# Patient Record
Sex: Female | Born: 1960
Health system: Southern US, Community
[De-identification: ages and names within clinical notes are randomized; demographics above are authoritative.]

## PROBLEM LIST (undated history)

## (undated) DIAGNOSIS — E039 Hypothyroidism, unspecified: Secondary | ICD-10-CM

## (undated) DIAGNOSIS — E785 Hyperlipidemia, unspecified: Secondary | ICD-10-CM

## (undated) DIAGNOSIS — I82409 Acute embolism and thrombosis of unspecified deep veins of unspecified lower extremity: Secondary | ICD-10-CM

## (undated) DIAGNOSIS — I219 Acute myocardial infarction, unspecified: Secondary | ICD-10-CM

## (undated) DIAGNOSIS — R05 Cough: Secondary | ICD-10-CM

## (undated) DIAGNOSIS — K219 Gastro-esophageal reflux disease without esophagitis: Secondary | ICD-10-CM

## (undated) DIAGNOSIS — R001 Bradycardia, unspecified: Secondary | ICD-10-CM

## (undated) DIAGNOSIS — F419 Anxiety disorder, unspecified: Secondary | ICD-10-CM

## (undated) DIAGNOSIS — C50919 Malignant neoplasm of unspecified site of unspecified female breast: Secondary | ICD-10-CM

## (undated) DIAGNOSIS — Z803 Family history of malignant neoplasm of breast: Secondary | ICD-10-CM

## (undated) DIAGNOSIS — M1990 Unspecified osteoarthritis, unspecified site: Secondary | ICD-10-CM

## (undated) DIAGNOSIS — Z4502 Encounter for adjustment and management of automatic implantable cardiac defibrillator: Secondary | ICD-10-CM

## (undated) DIAGNOSIS — Z923 Personal history of irradiation: Secondary | ICD-10-CM

## (undated) DIAGNOSIS — Z87898 Personal history of other specified conditions: Secondary | ICD-10-CM

## (undated) DIAGNOSIS — T464X5A Adverse effect of angiotensin-converting-enzyme inhibitors, initial encounter: Secondary | ICD-10-CM

## (undated) DIAGNOSIS — I2699 Other pulmonary embolism without acute cor pulmonale: Secondary | ICD-10-CM

## (undated) DIAGNOSIS — I340 Nonrheumatic mitral (valve) insufficiency: Secondary | ICD-10-CM

## (undated) DIAGNOSIS — G459 Transient cerebral ischemic attack, unspecified: Secondary | ICD-10-CM

## (undated) DIAGNOSIS — R058 Other specified cough: Secondary | ICD-10-CM

## (undated) DIAGNOSIS — I4891 Unspecified atrial fibrillation: Secondary | ICD-10-CM

## (undated) DIAGNOSIS — M7989 Other specified soft tissue disorders: Secondary | ICD-10-CM

## (undated) DIAGNOSIS — I428 Other cardiomyopathies: Secondary | ICD-10-CM

## (undated) DIAGNOSIS — I5022 Chronic systolic (congestive) heart failure: Secondary | ICD-10-CM

## (undated) DIAGNOSIS — R42 Dizziness and giddiness: Secondary | ICD-10-CM

## (undated) DIAGNOSIS — Z95810 Presence of automatic (implantable) cardiac defibrillator: Secondary | ICD-10-CM

## (undated) DIAGNOSIS — T7840XA Allergy, unspecified, initial encounter: Secondary | ICD-10-CM

## (undated) HISTORY — DX: Other specified cough: R05.8

## (undated) HISTORY — DX: Personal history of other specified conditions: Z87.898

## (undated) HISTORY — DX: Dizziness and giddiness: R42

## (undated) HISTORY — DX: Encounter for adjustment and management of automatic implantable cardiac defibrillator: Z45.02

## (undated) HISTORY — DX: Nonrheumatic mitral (valve) insufficiency: I34.0

## (undated) HISTORY — PX: COLONOSCOPY: SHX174

## (undated) HISTORY — DX: Acute myocardial infarction, unspecified: I21.9

## (undated) HISTORY — DX: Chronic systolic (congestive) heart failure: I50.22

## (undated) HISTORY — DX: Adverse effect of angiotensin-converting-enzyme inhibitors, initial encounter: T46.4X5A

## (undated) HISTORY — DX: Transient cerebral ischemic attack, unspecified: G45.9

## (undated) HISTORY — DX: Family history of malignant neoplasm of breast: Z80.3

## (undated) HISTORY — DX: Gastro-esophageal reflux disease without esophagitis: K21.9

## (undated) HISTORY — PX: UPPER GI ENDOSCOPY: SHX6162

## (undated) HISTORY — DX: Hyperlipidemia, unspecified: E78.5

## (undated) HISTORY — DX: Other specified soft tissue disorders: M79.89

## (undated) HISTORY — DX: Other pulmonary embolism without acute cor pulmonale: I26.99

## (undated) HISTORY — DX: Bradycardia, unspecified: R00.1

## (undated) HISTORY — DX: Allergy, unspecified, initial encounter: T78.40XA

## (undated) HISTORY — DX: Acute embolism and thrombosis of unspecified deep veins of unspecified lower extremity: I82.409

## (undated) HISTORY — PX: CYST EXCISION: SHX5701

## (undated) HISTORY — DX: Anxiety disorder, unspecified: F41.9

## (undated) HISTORY — DX: Unspecified atrial fibrillation: I48.91

## (undated) HISTORY — DX: Other cardiomyopathies: I42.8

## (undated) HISTORY — DX: Cough: R05

## (undated) HISTORY — DX: Hypothyroidism, unspecified: E03.9

---

## 1992-10-24 HISTORY — PX: TUBAL LIGATION: SHX77

## 1999-04-13 ENCOUNTER — Encounter: Payer: Self-pay | Admitting: Endocrinology

## 1999-04-13 ENCOUNTER — Ambulatory Visit (HOSPITAL_COMMUNITY): Admission: RE | Admit: 1999-04-13 | Discharge: 1999-04-13 | Payer: Self-pay | Admitting: Endocrinology

## 1999-10-25 HISTORY — PX: TOTAL THYROIDECTOMY: SHX2547

## 1999-12-13 ENCOUNTER — Ambulatory Visit (HOSPITAL_COMMUNITY): Admission: RE | Admit: 1999-12-13 | Discharge: 1999-12-13 | Payer: Self-pay | Admitting: Endocrinology

## 1999-12-13 ENCOUNTER — Encounter: Payer: Self-pay | Admitting: Endocrinology

## 1999-12-31 ENCOUNTER — Other Ambulatory Visit: Admission: RE | Admit: 1999-12-31 | Discharge: 1999-12-31 | Payer: Self-pay | Admitting: Endocrinology

## 2000-01-17 ENCOUNTER — Encounter: Payer: Self-pay | Admitting: General Surgery

## 2000-01-18 ENCOUNTER — Ambulatory Visit (HOSPITAL_COMMUNITY): Admission: RE | Admit: 2000-01-18 | Discharge: 2000-01-19 | Payer: Self-pay | Admitting: General Surgery

## 2000-12-19 ENCOUNTER — Encounter (INDEPENDENT_AMBULATORY_CARE_PROVIDER_SITE_OTHER): Payer: Self-pay | Admitting: *Deleted

## 2000-12-19 ENCOUNTER — Ambulatory Visit (HOSPITAL_COMMUNITY): Admission: RE | Admit: 2000-12-19 | Discharge: 2000-12-19 | Payer: Self-pay | Admitting: *Deleted

## 2005-06-09 ENCOUNTER — Ambulatory Visit (HOSPITAL_COMMUNITY): Admission: RE | Admit: 2005-06-09 | Discharge: 2005-06-09 | Payer: Self-pay | Admitting: *Deleted

## 2005-06-09 ENCOUNTER — Encounter (INDEPENDENT_AMBULATORY_CARE_PROVIDER_SITE_OTHER): Payer: Self-pay | Admitting: *Deleted

## 2005-06-16 ENCOUNTER — Ambulatory Visit: Payer: Self-pay | Admitting: Family Medicine

## 2005-10-24 DIAGNOSIS — G459 Transient cerebral ischemic attack, unspecified: Secondary | ICD-10-CM

## 2005-10-24 DIAGNOSIS — I639 Cerebral infarction, unspecified: Secondary | ICD-10-CM

## 2005-10-24 HISTORY — DX: Transient cerebral ischemic attack, unspecified: G45.9

## 2005-10-24 HISTORY — DX: Cerebral infarction, unspecified: I63.9

## 2006-08-25 ENCOUNTER — Ambulatory Visit (HOSPITAL_COMMUNITY): Admission: RE | Admit: 2006-08-25 | Discharge: 2006-08-25 | Payer: Self-pay | Admitting: *Deleted

## 2006-08-25 ENCOUNTER — Encounter (INDEPENDENT_AMBULATORY_CARE_PROVIDER_SITE_OTHER): Payer: Self-pay | Admitting: *Deleted

## 2006-09-19 ENCOUNTER — Ambulatory Visit: Payer: Self-pay | Admitting: Cardiology

## 2006-09-19 ENCOUNTER — Encounter (INDEPENDENT_AMBULATORY_CARE_PROVIDER_SITE_OTHER): Payer: Self-pay | Admitting: Neurology

## 2006-09-19 ENCOUNTER — Encounter (INDEPENDENT_AMBULATORY_CARE_PROVIDER_SITE_OTHER): Payer: Self-pay | Admitting: Cardiology

## 2006-09-19 ENCOUNTER — Inpatient Hospital Stay (HOSPITAL_COMMUNITY): Admission: EM | Admit: 2006-09-19 | Discharge: 2006-09-21 | Payer: Self-pay | Admitting: Emergency Medicine

## 2006-09-21 ENCOUNTER — Encounter: Payer: Self-pay | Admitting: Cardiology

## 2006-10-04 ENCOUNTER — Ambulatory Visit: Payer: Self-pay | Admitting: Cardiology

## 2006-11-23 ENCOUNTER — Ambulatory Visit: Payer: Self-pay | Admitting: Cardiology

## 2006-11-30 ENCOUNTER — Ambulatory Visit: Payer: Self-pay | Admitting: Cardiology

## 2006-12-07 ENCOUNTER — Ambulatory Visit: Payer: Self-pay

## 2006-12-14 ENCOUNTER — Ambulatory Visit: Payer: Self-pay | Admitting: Cardiology

## 2007-06-07 ENCOUNTER — Ambulatory Visit: Payer: Self-pay | Admitting: Cardiology

## 2007-07-05 ENCOUNTER — Ambulatory Visit: Payer: Self-pay | Admitting: Cardiology

## 2007-08-07 ENCOUNTER — Ambulatory Visit: Payer: Self-pay | Admitting: Cardiology

## 2007-10-04 ENCOUNTER — Ambulatory Visit: Payer: Self-pay | Admitting: Cardiology

## 2007-10-15 ENCOUNTER — Ambulatory Visit: Payer: Self-pay | Admitting: Cardiology

## 2007-10-25 DIAGNOSIS — Z95810 Presence of automatic (implantable) cardiac defibrillator: Secondary | ICD-10-CM

## 2007-10-25 HISTORY — DX: Presence of automatic (implantable) cardiac defibrillator: Z95.810

## 2007-10-25 HISTORY — PX: CARDIAC DEFIBRILLATOR PLACEMENT: SHX171

## 2007-12-04 ENCOUNTER — Ambulatory Visit: Payer: Self-pay | Admitting: Cardiology

## 2007-12-21 ENCOUNTER — Ambulatory Visit: Payer: Self-pay | Admitting: Internal Medicine

## 2008-01-04 ENCOUNTER — Ambulatory Visit: Payer: Self-pay | Admitting: Internal Medicine

## 2008-01-04 LAB — CONVERTED CEMR LAB
Basophils Relative: 0.9 % (ref 0.0–1.0)
CO2: 29 meq/L (ref 19–32)
Calcium: 9 mg/dL (ref 8.4–10.5)
Eosinophils Relative: 7.8 % — ABNORMAL HIGH (ref 0.0–5.0)
GFR calc Af Amer: 86 mL/min
Glucose, Bld: 80 mg/dL (ref 70–99)
Hemoglobin: 10.8 g/dL — ABNORMAL LOW (ref 12.0–15.0)
Lymphocytes Relative: 25 % (ref 12.0–46.0)
Monocytes Absolute: 0.5 10*3/uL (ref 0.2–0.7)
Neutro Abs: 3.2 10*3/uL (ref 1.4–7.7)
Platelets: 184 10*3/uL (ref 150–400)
Prothrombin Time: 11.4 s (ref 10.9–13.3)
WBC: 5.8 10*3/uL (ref 4.5–10.5)

## 2008-01-11 ENCOUNTER — Observation Stay (HOSPITAL_COMMUNITY): Admission: RE | Admit: 2008-01-11 | Discharge: 2008-01-12 | Payer: Self-pay | Admitting: Internal Medicine

## 2008-01-11 ENCOUNTER — Ambulatory Visit: Payer: Self-pay | Admitting: Internal Medicine

## 2008-01-11 ENCOUNTER — Encounter: Payer: Self-pay | Admitting: Cardiology

## 2008-01-12 ENCOUNTER — Encounter: Payer: Self-pay | Admitting: Internal Medicine

## 2008-01-23 ENCOUNTER — Ambulatory Visit: Payer: Self-pay | Admitting: Cardiology

## 2008-01-23 ENCOUNTER — Ambulatory Visit: Payer: Self-pay

## 2008-01-23 LAB — CONVERTED CEMR LAB
CO2: 29 meq/L (ref 19–32)
Calcium: 8.5 mg/dL (ref 8.4–10.5)
Chloride: 108 meq/L (ref 96–112)
Glucose, Bld: 84 mg/dL (ref 70–99)
Sodium: 139 meq/L (ref 135–145)

## 2008-02-20 ENCOUNTER — Ambulatory Visit: Payer: Self-pay | Admitting: Cardiology

## 2008-04-21 ENCOUNTER — Ambulatory Visit: Payer: Self-pay | Admitting: Internal Medicine

## 2008-08-20 ENCOUNTER — Ambulatory Visit: Payer: Self-pay | Admitting: Cardiology

## 2008-09-11 ENCOUNTER — Encounter: Payer: Self-pay | Admitting: Cardiology

## 2008-09-30 ENCOUNTER — Ambulatory Visit: Payer: Self-pay | Admitting: Cardiology

## 2008-11-14 ENCOUNTER — Ambulatory Visit: Payer: Self-pay | Admitting: Internal Medicine

## 2008-11-17 ENCOUNTER — Encounter: Payer: Self-pay | Admitting: Internal Medicine

## 2009-04-10 ENCOUNTER — Encounter: Payer: Self-pay | Admitting: Cardiology

## 2009-05-11 ENCOUNTER — Telehealth: Payer: Self-pay | Admitting: Cardiology

## 2009-06-02 ENCOUNTER — Ambulatory Visit: Payer: Self-pay | Admitting: Cardiology

## 2009-11-09 ENCOUNTER — Encounter: Payer: Self-pay | Admitting: Cardiology

## 2009-12-17 ENCOUNTER — Encounter: Payer: Self-pay | Admitting: Cardiology

## 2010-01-19 ENCOUNTER — Encounter: Payer: Self-pay | Admitting: Cardiology

## 2010-01-19 DIAGNOSIS — Z8673 Personal history of transient ischemic attack (TIA), and cerebral infarction without residual deficits: Secondary | ICD-10-CM | POA: Insufficient documentation

## 2010-01-19 DIAGNOSIS — I428 Other cardiomyopathies: Secondary | ICD-10-CM | POA: Insufficient documentation

## 2010-01-20 ENCOUNTER — Ambulatory Visit: Payer: Self-pay | Admitting: Cardiology

## 2010-01-20 DIAGNOSIS — M79609 Pain in unspecified limb: Secondary | ICD-10-CM | POA: Insufficient documentation

## 2010-04-19 ENCOUNTER — Ambulatory Visit: Payer: Self-pay | Admitting: Internal Medicine

## 2010-11-02 ENCOUNTER — Telehealth: Payer: Self-pay | Admitting: Cardiology

## 2010-11-10 ENCOUNTER — Telehealth: Payer: Self-pay | Admitting: Cardiology

## 2010-11-23 NOTE — Letter (Signed)
Summary: Appointment- Rescheduled  [facility] [address]   [place], [place] [address]   Phone: [phone]  Fax: [phone]     December 17, 2009 MRN: [id number]     [facility] [place] [address] [place], [place]  [address]     Dear Ms. Meggison,   Due to a change in our office schedule, your appointment on March 31 at 2:30 pm                     must be changed.    Your new appointment is scheduled for January 20, 2010 at 1:30 pm.  We look forward to participating in your health care needs.   Please contact us at the number listed above at your earliest convenience to reschedule this appointment if needed.       Sincerely,  Glass blower/designer

## 2010-11-23 NOTE — Miscellaneous (Signed)
  Clinical Lists Changes  Problems: Added new problem of CARDIOMYOPATHY, PRIMARY (ICD-425.4) Added new problem of * RIGHT LEG SWELLING Added new problem of * WEIGHT GAIN Added new problem of * COUGH...ACE Added new problem of TIA (ICD-435.9) Added new problem of GERD (ICD-530.81) Added new problem of * ICD Added new problem of BRADYCARDIA (ICD-427.89) Observations: Added new observation of PAST MED HX: nonischemic cardiomyopathy...etiology unknown.. ejection fraction 30-35%  bradycardia  ICD placement Dr. Graciela Husbands  june 2009....(June 2009 artifactual atrial tachycardia response events hopefully addressed by reprogramming) GERD Seasonal allergies Hypothyroidism LDL elevation...mild...Pt. does not want to use a statin TIA...  no CT or MRI abnormality.Marland KitchenMarland KitchenASA  RX cough  (mild) from ACE inhibitor but ACE inhibitor continued at the patient's preference Weight gain   Swelling Right leg....venous insuff. (01/19/2010 10:58) Added new observation of PRIMARY MD: Prudy Feeler PA-C (01/19/2010 10:58)       Past History:  Past Medical History: nonischemic cardiomyopathy...etiology unknown.. ejection fraction 30-35%  bradycardia  ICD placement Dr. Graciela Husbands  june 2009....(June 2009 artifactual atrial tachycardia response events hopefully addressed by reprogramming) GERD Seasonal allergies Hypothyroidism LDL elevation...mild...Pt. does not want to use a statin TIA...  no CT or MRI abnormality.Marland KitchenMarland KitchenASA  RX cough  (mild) from ACE inhibitor but ACE inhibitor continued at the patient's preference Weight gain   Swelling Right leg....venous insuff.

## 2010-11-23 NOTE — Assessment & Plan Note (Signed)
Summary: 6 MONTH FU RECV LETTER VS   Visit Type:  Follow-up Primary Provider:  Prudy Feeler PA-C  CC:  cardiomyopathy.  History of Present Illness: The patient is seen for followup of cardiomyopathy.  The cardiac viewpoint she is doing well.  She is taking appropriate medications.  She has only mild shortness of breath.  No chest pain.  She is being bothered by discomfort in her left leg.  It affects her knee but also she can have pains that shoot up and down her left leg.  Preventive Screening-Counseling & Management  Alcohol-Tobacco     Smoking Status: never  Problems Prior to Update: 1)  Left Leg Pain  () 2)  Bradycardia  (ICD-427.89) 3)  Icd  () 4)  Gerd  (ICD-530.81) 5)  Tia  (ICD-435.9) 6)  Cough...ace  () 7)  Weight Gain  () 8)  Right Leg Swelling  () 9)  Cardiomyopathy, Primary  (ICD-425.4)  Medications Prior to Update: 1)  Carvedilol 25 Mg Tabs (Carvedilol) .... Take 1 Tablet By Mouth Twice A Day 2)  Simvastatin 20 Mg Tabs (Simvastatin) .... Take 1 Tablet By Mouth At Bedtime 3)  Lotensin 20 Mg Tabs (Benazepril Hcl) .... Take 1 Tablet By Mouth Once A Day  Current Medications (verified): 1)  Carvedilol 25 Mg Tabs (Carvedilol) .... Take 1 Tablet By Mouth Twice A Day 2)  Lotensin 20 Mg Tabs (Benazepril Hcl) .... Take 1 Tablet By Mouth Once A Day 3)  Aspir-Trin 325 Mg Tbec (Aspirin) .... Take One Tablet Daily 4)  Levothyroxine Sodium 100 Mcg Tabs (Levothyroxine Sodium) .... Take One Tablet Daily 5)  Vitamin D 50,000 Units .... Take One Daily 6)  Ambien 10 Mg Tabs (Zolpidem Tartrate) .... As Needed 7)  Prilosec 40 Mg Cpdr (Omeprazole) .... Twice A Day 8)  Welchol 625 Mg Tabs (Colesevelam Hcl) .... Take Six Tablet Daily  Allergies (verified): 1)  ! * Kiwi  Comments:  Nurse/Medical Assistant: The patient's medications and allergies were reviewed with the patient and were updated in the Medication and Allergy Lists. Youlanda Mighty RN (January 20, 2010 1:45  PM)  Past History:  Family History: Last updated: 09/30/2008 no strong family history of coronary disease  Past Medical History: nonischemic cardiomyopathy...etiology unknown.. ejection fraction 30-35%  bradycardia  ICD placement Dr. Graciela Husbands  june 2009....(June 2009 artifactual atrial tachycardia response events hopefully addressed by reprogramming) GERD Seasonal allergies Hypothyroidism LDL elevation...mild...Pt. does not want to use a statin TIA...  no CT or MRI abnormality.Marland KitchenMarland KitchenASA  RX cough  (mild) from ACE inhibitor but ACE inhibitor continued at the patient's preference Weight gain   Swelling Right leg....venous insuff. Pain in left leg and left knee  Social History: Smoking Status:  never  Review of Systems       Patient denies fever, chills, headache, sweats, rash, change in vision, change in hearing, chest pain, shortness of breath, cough, nausea vomiting, urinary symptoms.  All other systems are negative other than the history of present illness.  Vital Signs:  Patient profile:   50 year old female Height:      68 inches Weight:      201.8 pounds BMI:     30.79 Pulse rate:   56 / minute BP sitting:   110 / 76  (left arm) Cuff size:   regular  Nutrition Counseling: Patient's BMI is greater than 25 and therefore counseled on weight management options. CC: cardiomyopathy   Physical Exam  General:  patient is stable in general.  Eyes:  no xanthelasma. Neck:  no jugular venous distention. Lungs:  lungs are clear respiratory effort is unlabored. Heart:  cardiac exam reveals S1 and S2.  No clicks or significant murmurs. Abdomen:  abdomen is soft. Msk:  no musculoskeletal deformities. Extremities:  there is no significant swelling in her left leg.  She has a good distal pulse. Skin:  no skin rashes. Psych:  patient is oriented to person time and place.  Affect is normal.    ICD Specifications ICD Vendor:  [employer]     ICD Model Number:  T165     ICD  Serial Number:  [id number] ICD DOI:  01/11/2008     ICD Implanting MD:  Sherryl Manges, MD  Lead 1:    Location: RA     DOI: 01/11/2008     Model #: [id number]     Serial #: [id number]     Status: active Lead 2:    Location: RV     DOI: 01/11/2008     Model #: [id number]     Serial #: [id number]     Status: active  Indications::  NICM   Impression & Recommendations:  Problem # 1:  * ICD ICD is in place.  Problem # 2:  CARDIOMYOPATHY, PRIMARY (ICD-425.4)  Her updated medication list for this problem includes:    Carvedilol 25 Mg Tabs (Carvedilol) .[name]... Take 1 tablet by mouth twice a day    Lotensin 20 Mg Tabs (Benazepril hcl) .[name]... Take 1 tablet by mouth once a day    Aspir-trin 325 Mg Tbec (Aspirin) .[name]... Take one tablet daily Patient is on appropriate medicines for her cardiomyopathy.  No change in therapy.  Problem # 3:  * LEFT LEG PAIN I do not believe that the left leg pain is vascular in origin.  I have encouraged her to see her primary team for early evaluation.  Patient Instructions: 1)  Your physician recommends that you continue on your current medications as directed. Please refer to the Current Medication list given to you today. 2)  Your physician wants you to follow-up in: 6 months.  You will receive a reminder letter in the mail about two months in advance. If you don't receive a letter, please call our office to schedule the follow-up appointment. 3)  Please arrange follow up for your device or let us know and we can set you up.Appointment scheduled for May 4th @3 :00pm with Dr. Graciela Husbands at [facility].

## 2010-11-23 NOTE — Cardiovascular Report (Signed)
Summary: Office Visit   Office Visit   Imported By: Roderic Ovens 04/28/2010 15:41:30  _____________________________________________________________________  External Attachment:    Type:   Image     Comment:   External Document

## 2010-11-23 NOTE — Assessment & Plan Note (Signed)
Summary: f2y/ gd   Primary Provider:  Prudy Feeler PA-C  CC:  f2y/pt reports some swelling and pain in her feet and ankles that has been happening more frequently.  Pt has concerns about this as it has been becoming painful.  History of Present Illness: [name] [name] is seen in followup for her nonischemic cardiomyopathy and bradycardia status post ICD implantation for primary prevention.   The patient is seen for followup of cardiomyopathy.  The cardiac viewpoint she is doing well.  She is taking appropriate medications.  She has only mild shortness of breath.  No chest pain.  She has a variety of neurological complaints including tingling in her right arm and occasionally in her right leg. She'll to describes burning in her feet bilaterally.  old records suggested there was pain in her left leg.  Current Medications (verified): 1)  Carvedilol 25 Mg Tabs (Carvedilol) .... Take 1 Tablet By Mouth Twice A Day 2)  Lotensin 20 Mg Tabs (Benazepril Hcl) .... Take 1 Tablet By Mouth Once A Day 3)  Aspir-Trin 325 Mg Tbec (Aspirin) .... Take One Tablet Daily 4)  Levothyroxine Sodium 100 Mcg Tabs (Levothyroxine Sodium) .... Take One Tablet Daily 5)  Ambien 10 Mg Tabs (Zolpidem Tartrate) .... As Needed 6)  Prilosec 40 Mg Cpdr (Omeprazole) .... Twice A Day 7)  Nuvigil 150 Mg Tabs (Armodafinil) .... Take One Tablet Once Daily  Allergies (verified): 1)  ! [place]  Past History:  Past Medical History: Last updated: 01/20/2010 nonischemic cardiomyopathy...etiology unknown.. ejection fraction 30-35%  bradycardia  ICD placement Dr. Graciela Husbands  june 2009....(June 2009 artifactual atrial tachycardia response events hopefully addressed by reprogramming) GERD Seasonal allergies Hypothyroidism LDL elevation...mild...Pt. does not want to use a statin TIA...  no CT or MRI abnormality.Marland KitchenMarland KitchenASA  RX cough  (mild) from ACE inhibitor but ACE inhibitor continued at the patient's preference Weight gain   Swelling Right  leg....venous insuff. Pain in left leg and left knee  Family History: Last updated: 09/30/2008 no strong family history of coronary disease  Social History: Last updated: 09/30/2008 nonsmoker Widow  Double cover operator(physical work)  at [employer]  Vital Signs:  Patient profile:   50 year old female Height:      68 inches Weight:      203 pounds Pulse rate:   69 / minute Pulse rhythm:   regular BP sitting:   114 / 80  (left arm) Cuff size:   regular  Vitals Entered By: Judithe Modest CMA (April 19, 2010 3:43 PM)  Physical Exam  General:  The patient was alert and oriented in no acute distress. HEENT Normal.  Neck veins were flat, carotids were brisk.  Lungs were clear.  Heart sounds were regular without murmurs or gallops.  Abdomen was soft with active bowel sounds. There is no clubbing cyanosis or edema. Skin Warm and dry    EKG  Procedure date:  04/19/2010  Findings:      sinus rhythm 69 Intervals 0.17/0.08/23 Axis is 11   ICD Specifications Following MD:  Sherryl Manges, MD     ICD Vendor:  [facility] [employer]     ICD Model Number:  [phone]     ICD Serial Number:  [id number] ICD DOI:  01/11/2008     ICD Implanting MD:  Sherryl Manges, MD  Lead 1:    Location: RA     DOI: 01/11/2008     Model #: [id number]     Serial #: [id number]     Status: active  Lead 2:    Location: RV     DOI: 01/11/2008     Model #: 1610     Serial #: [id number]     Status: active  Indications::  NICM   ICD Follow Up Battery Voltage:  3.11 V     Charge Time:  7.4 seconds     Underlying rhythm:  SR@65    ICD Device Measurements Atrium:  Amplitude: 1.8 mV, Impedance: 454 ohms, Threshold: 0.40 V at 0.4 msec Right Ventricle:  Amplitude: 22.4 mV, Impedance: 511 ohms, Threshold: 0.8 V at 0.4 msec Shock Impedance: 42 ohms   Episodes MS Episodes:  0     Shock:  0     ATP:  0     Nonsustained:  0     Atrial Therapies:  0 Ventricular Pacing:  0%  Brady Parameters Mode DDD     Lower Rate Limit:  50     Upper  Rate Limit 120  Tachy Zones VF:  240     VT:  210     VT1:  180     Next Cardiology Appt Due:  06/24/2010 Tech Comments:  NORMAL DEVICE FUNCTION. NO CHANGES MADE. NO LANDLINE FOR LATITUDE CHECKS.  ROV IN 3 MTHS.  [name] [name]  April 19, 2010 3:59 PM  Impression & Recommendations:  Problem # 1:  CARDIOMYOPATHY, PRIMARY (ICD-425.4)  stable on her current medication Her updated medication list for this problem includes:    Carvedilol 25 Mg Tabs (Carvedilol) .[name]... Take 1 tablet by mouth twice a day    Lotensin 20 Mg Tabs (Benazepril hcl) .[name]... Take 1 tablet by mouth once a day    Aspir-trin 325 Mg Tbec (Aspirin) .[name]... Take one tablet daily  Her updated medication list for this problem includes:    Carvedilol 25 Mg Tabs (Carvedilol) .[name]... Take 1 tablet by mouth twice a day    Lotensin 20 Mg Tabs (Benazepril hcl) .[name]... Take 1 tablet by mouth once a day    Aspir-trin 325 Mg Tbec (Aspirin) .[name]... Take one tablet daily  Problem # 2:  AUTOMATIC IMPLANTABLE CARDIAC DEFIBRILLATOR SITU (ICD-V45.02) Device parameters and data were reviewed and no changes were made  Problem # 3:  FOOT PAIN (ICD-729.5) Her symptoms are suggestive of a neuropathy. With her right-sided symptoms now and previously described left-sided symptoms I wonder whether there is international units and her see a neurologist. I have suggested that she follow up with her primary care provider to request this referral.  Other Orders: EKG w/ Interpretation (93000)  Patient Instructions: 1)  Your physician recommends that you schedule a follow-up appointment in: 3 months in [place] device clinic.

## 2010-11-25 NOTE — Progress Notes (Signed)
Summary: Dentist office  Phone Note From Other Clinic Call back at [phone] or [phone]   Caller: Darl Pikes w/Dr. Jennette Kettle Summary of Call: Darl Pikes with Dr. Jennette Kettle (dentist) in [place] called stating they haven't seen pt in a while. She is re-establishing with them and has told them that she has had a lot of medical problems since last visit with them. Darl Pikes states pt told them she had a stroke and ICD placed. She would like to know if she has any restrictions regarding dental work. She states she would like to kow if pt needs a pre-med of antibiotic before dental work. She states pt has told them in the past that she has MVP so they have always treated w/pre-med before cleanings but she would like to know if this is necessary.  Initial call taken by: Cyril Loosen, RN, BSN,  November 02, 2010 5:02 PM  Follow-up for Phone Call        The patient can have usual dental work with no restrictions. There is no need for antibiotic prophyllaxis.  Talitha Givens, MD, [facility]  November 02, 2010 11:12 PM Darl Pikes w/Dr. Jennette Kettle notified and verbalized understanding. Follow-up by: Cyril Loosen, RN, BSN,  November 03, 2010 9:33 AM

## 2010-11-25 NOTE — Progress Notes (Signed)
Summary: Dizziness  Phone Note Call from Patient   Summary of Call: Pt called the office stating she is experiencing dizziness when she stands after squating. She states this first started on Monday (1/16) night at work. She states she tought she would feel better if she ate something but she didn't. She states the next am (1/17) her BP was 100/66. She states she watched it throughout the day and it never really came back up. She states Tues night at work she had the same symptoms. Her BP was 116/74. She states she had to leave work d/t dizziness when standing from a squat. She states when she's walking or doing anything she's fine. Pt denies any other symptoms or problems. She has appt pending with Dr. Myrtis Ser on 1/16. She will see primary MD for further evaluation of dizziness and have them contact us if they feel pt needs cardiac eval prior to 1/16. Initial call taken by: Cyril Loosen, RN, BSN,  November 10, 2010 11:06 AM     Appended Document: Dizziness Thanks

## 2010-12-31 ENCOUNTER — Encounter (INDEPENDENT_AMBULATORY_CARE_PROVIDER_SITE_OTHER): Payer: Self-pay | Admitting: *Deleted

## 2011-01-04 ENCOUNTER — Encounter: Payer: Self-pay | Admitting: Cardiology

## 2011-01-04 NOTE — Letter (Signed)
Summary: Appointment - Reschedule  [facility], Main Office  [address]   [place], [place] [address]   Phone: [phone]  Fax: [phone]     December 31, 2010 MRN: [id number]   [facility] [place] [address] [place], [place]  [address]   Dear Ms. Scarlett,   Due to a change in our office schedule, your appointment on 01-19-2011                      at  2:00 p.m.   must be changed.  It is very important that we reach you to reschedule this appointment. We look forward to participating in your health care needs. Please contact us at the number listed above at your earliest convenience to reschedule this appointment.     Sincerely,       Lorne Skeens  [facility] Scheduling Team

## 2011-01-05 ENCOUNTER — Encounter: Payer: Self-pay | Admitting: Internal Medicine

## 2011-01-05 ENCOUNTER — Ambulatory Visit: Payer: Self-pay | Admitting: Cardiology

## 2011-01-17 ENCOUNTER — Encounter: Payer: Self-pay | Admitting: Cardiology

## 2011-01-17 DIAGNOSIS — E7800 Pure hypercholesterolemia, unspecified: Secondary | ICD-10-CM | POA: Insufficient documentation

## 2011-01-17 DIAGNOSIS — K219 Gastro-esophageal reflux disease without esophagitis: Secondary | ICD-10-CM | POA: Insufficient documentation

## 2011-01-17 DIAGNOSIS — M7989 Other specified soft tissue disorders: Secondary | ICD-10-CM | POA: Insufficient documentation

## 2011-01-17 DIAGNOSIS — R058 Other specified cough: Secondary | ICD-10-CM | POA: Insufficient documentation

## 2011-01-17 DIAGNOSIS — E039 Hypothyroidism, unspecified: Secondary | ICD-10-CM | POA: Insufficient documentation

## 2011-01-17 DIAGNOSIS — T464X5A Adverse effect of angiotensin-converting-enzyme inhibitors, initial encounter: Secondary | ICD-10-CM | POA: Insufficient documentation

## 2011-01-17 DIAGNOSIS — Z95810 Presence of automatic (implantable) cardiac defibrillator: Secondary | ICD-10-CM | POA: Insufficient documentation

## 2011-01-17 DIAGNOSIS — I428 Other cardiomyopathies: Secondary | ICD-10-CM | POA: Insufficient documentation

## 2011-01-17 DIAGNOSIS — R001 Bradycardia, unspecified: Secondary | ICD-10-CM | POA: Insufficient documentation

## 2011-01-17 DIAGNOSIS — E782 Mixed hyperlipidemia: Secondary | ICD-10-CM | POA: Insufficient documentation

## 2011-01-17 DIAGNOSIS — E785 Hyperlipidemia, unspecified: Secondary | ICD-10-CM | POA: Insufficient documentation

## 2011-01-17 DIAGNOSIS — R05 Cough: Secondary | ICD-10-CM | POA: Insufficient documentation

## 2011-01-18 ENCOUNTER — Ambulatory Visit (INDEPENDENT_AMBULATORY_CARE_PROVIDER_SITE_OTHER): Payer: BC Managed Care – PPO | Admitting: Cardiology

## 2011-01-18 ENCOUNTER — Encounter: Payer: Self-pay | Admitting: Cardiology

## 2011-01-18 ENCOUNTER — Encounter: Payer: Self-pay | Admitting: *Deleted

## 2011-01-18 DIAGNOSIS — R05 Cough: Secondary | ICD-10-CM

## 2011-01-18 DIAGNOSIS — R42 Dizziness and giddiness: Secondary | ICD-10-CM | POA: Insufficient documentation

## 2011-01-18 DIAGNOSIS — I498 Other specified cardiac arrhythmias: Secondary | ICD-10-CM

## 2011-01-18 DIAGNOSIS — T464X5A Adverse effect of angiotensin-converting-enzyme inhibitors, initial encounter: Secondary | ICD-10-CM

## 2011-01-18 DIAGNOSIS — R058 Other specified cough: Secondary | ICD-10-CM

## 2011-01-18 DIAGNOSIS — I428 Other cardiomyopathies: Secondary | ICD-10-CM | POA: Insufficient documentation

## 2011-01-18 DIAGNOSIS — M7989 Other specified soft tissue disorders: Secondary | ICD-10-CM

## 2011-01-18 DIAGNOSIS — R059 Cough, unspecified: Secondary | ICD-10-CM

## 2011-01-18 DIAGNOSIS — Z4502 Encounter for adjustment and management of automatic implantable cardiac defibrillator: Secondary | ICD-10-CM

## 2011-01-18 DIAGNOSIS — R001 Bradycardia, unspecified: Secondary | ICD-10-CM

## 2011-01-18 MED ORDER — CARVEDILOL 12.5 MG PO TABS
12.5000 mg | ORAL_TABLET | Freq: Two times a day (BID) | ORAL | Status: DC
Start: 2011-01-18 — End: 2011-11-22

## 2011-01-18 NOTE — Progress Notes (Signed)
HPI The patient is seen for followup of cardiomyopathy.  She is actually doing well.  She works regularly.  She has had some mild dizziness when standing after squatting at work.  Her but has a probe dose was adjusted.  She's also had some mild brief squeezing sensations in her chest.  These are very limited and not related to exercise.  She has some mild discomfort in her legs time to time.  She has not had syncope or presyncope. No Known Allergies  Current Outpatient Prescriptions  Medication Sig Dispense Refill  . Armodafinil 150 MG tablet Take 150 mg by mouth as needed.        [name] aspirin 325 MG EC tablet Take 325 mg by mouth daily.        . benazepril (LOTENSIN) 20 MG tablet Take 10 mg by mouth daily.       . carvedilol (COREG) 25 MG tablet Take 25 mg by mouth 2 (two) times daily with a meal.        . colesevelam (WELCHOL) 625 MG tablet Take 625 mg by mouth. 6 daily       . levothyroxine (SYNTHROID, LEVOTHROID) 100 MCG tablet Take 100 mcg by mouth daily.        . Vitamin D, Ergocalciferol, (DRISDOL) 50000 UNITS CAPS Take 50,000 Units by mouth once a week.        . zolpidem (AMBIEN) 10 MG tablet Take 10 mg by mouth at bedtime as needed.        [name] DISCONTD: Armodafinil 150 MG tablet Take 150 mg by mouth daily.        [name] DISCONTD: omeprazole (PRILOSEC) 40 MG capsule Take 40 mg by mouth 2 (two) times daily.          History   Social History  . Marital Status: Single    Spouse Name: N/A    Number of Children: N/A  . Years of Education: N/A   Occupational History  . Widow    Social History Main Topics  . Smoking status: Never Smoker   . Smokeless tobacco: Never Used  . Alcohol Use: Not on file  . Drug Use: Not on file  . Sexually Active: Not on file   Other Topics Concern  . Not on file   Social History Narrative  . No narrative on file    No family history on file.  Past Medical History  Diagnosis Date  . Nonischemic cardiomyopathy     .etiology unknown.. ejection fraction  30-35%   . Bradycardia   . GERD (gastroesophageal reflux disease)   . Hypothyroidism   . Dyslipidemia     LDL elevation,Patient does not want statin  . TIA (transient ischemic attack)     No CT or MRI abnormality, aspirin therapy  . Weight gain   . Leg swelling     right leg  . Pain     left leg & left knee  . Cough due to angiotensin-converting enzyme inhibitor     But patient chooses to continue ACE  . ICD (implantable cardiac defibrillator) battery depletion     Dr. Graciela Husbands, June, 2009(artifactual atrial tachycardia response events addressed by reprogramming in parentheses    Past Surgical History  Procedure Date  . Cardiac defibrillator placement     [facility] [employer] no remote    ROS  Patient denies fever, chills, headache, rash, change in vision, change in hearing, chest pain, cough, nausea vomiting, urinary symptoms.  All of the systems are  reviewed and are negative. PHYSICAL EXAM Patient appears quite stable.  She is oriented to person time and place.  Affect is normal head is atraumatic.  There is no xanthelasma.  There is no jugular venous distention.  Lungs are clear.  Respiratory effort is not labored.  Cardiac exam reveals S1 and S2.  There are no clicks or significant murmurs.  The abdomen is soft.  There is no significant peripheral edema.  There are no musculoskeletal deformities.  There are no skin rashes. Filed Vitals:   01/18/11 0933  BP: 124/74  Pulse: 63  Resp: 18  Height: 5\' 8"  (1.727 m)  Weight: 200 lb (90.719 kg)    EKG EKG is done today and reviewed by me.  There is normal sinus rhythm.  There is nonspecific ST-T wave changes.  There is no significant change.  ASSESSMENT & PLAN

## 2011-01-18 NOTE — Assessment & Plan Note (Signed)
Patient has had some dizziness when she stands after squatting at work.  They're most likely has an orthostatic component to this.  However her resting blood pressure is quite good.  I am hesitant to lower her ACE inhibitor dose further.  She does have resting bradycardia.  I will cut her carvedilol dose down from 25-12.5 b.i.d.  She asked me how long she would have to take the medicines for her heart.  I explained to her that she would be taking these indefinitely.

## 2011-01-18 NOTE — Patient Instructions (Signed)
Your physician recommends that you schedule a follow-up appointment in: 12 months with Dr. Myrtis Ser When exercising your target heart rate is 120 beats per minute. Your physician has recommended you make the following change in your medication: Decrease carvedilol to 12.5 mg by mouth twice daily.

## 2011-01-18 NOTE — Assessment & Plan Note (Signed)
She is not having a significant cough.  No change in therapy.

## 2011-01-18 NOTE — Assessment & Plan Note (Signed)
Heart rate is low today.  I will be decreasing her carvedilol dose.

## 2011-01-18 NOTE — Assessment & Plan Note (Addendum)
The patient is on appropriate medications.I reviewed the record and at this point I believe she has never had a cardiac catheterization.  She has been stable and I am not pushing for that at this time.  I've considered a followup echo.  Clinically she is stable and there is no need for echo data at this time. EKG is done today and reviewed by me.  There is sinus rhythm and no significant change.  The patient will begin an exercise program related to her primary care team.  I recommended that he heart rate in the range of 120.

## 2011-01-18 NOTE — Assessment & Plan Note (Signed)
No significant swelling at this time.  No change in therapy.

## 2011-01-18 NOTE — Assessment & Plan Note (Signed)
Her ICD is monitored carefully.  No changes are needed.

## 2011-01-19 ENCOUNTER — Encounter: Payer: Self-pay | Admitting: Internal Medicine

## 2011-02-09 ENCOUNTER — Encounter: Payer: Self-pay | Admitting: Internal Medicine

## 2011-02-22 ENCOUNTER — Encounter: Payer: Self-pay | Admitting: Internal Medicine

## 2011-03-08 NOTE — Assessment & Plan Note (Signed)
[facility]                            CARDIOLOGY OFFICE NOTE   VY, BADLEY                         MRN:          [id number]  DATE:02/20/2008                            DOB:          [birth year]    CANCELLED DICTATION     [name], MD, [facility]     JDK/MedQ  DD: 02/20/2008  DT: 02/20/2008  Job #: [id number]

## 2011-03-08 NOTE — Assessment & Plan Note (Signed)
[facility]                          [facility]   [name], [name]                         MRN:          [id number]  DATE:09/30/2008                            DOB:          1961-01-22    [name] [name] is here for followup.  She is doing well.  I saw her on August 20, 2008.  There was a question that she had a cough from lisinopril.  She stopped the lisinopril and the cough disappeared completely, but she  felt that it was only mild, and she preferred to be on the lisinopril  rather than having to start a different medication.  She is back on it  and she may have a slight cough, but it does not bother her and I am  comfortable with this.  This is her preference.   Also, she had recent labs.  Prior to that, she had stopped the  simvastatin on her own.  Her LDL was 87 and HDL 63 off simvastatin, but  certainly, we do not need to push for statin in her case.  Cardiac  catheterization has not been done so we really do not know the status of  her coronaries.   ALLERGIES:  No known drug allergies.   MEDICATIONS:  Aspirin, benazepril, thyroid, carvedilol, and omeprazole.   OTHER MEDICAL PROBLEMS:  See the list on the prior notes.   REVIEW OF SYSTEMS:  She has no GI or GU symptoms.  She has no fevers or  chills.  There are no skin rashes.  Her review of systems is negative.   PHYSICAL EXAMINATION:  VITAL SIGNS:  Blood pressure is 112/72 with a  pulse of 65.  GENERAL:  The patient is oriented to person, time, and place.  Affect is  normal.  HEENT:  No xanthelasma.  She has normal extraocular motion.  NECK:  There are no carotid bruits.  There is no jugular venous  distention.  LUNGS:  Clear.  Respiratory effort is not labored.  CARDIAC:  S1 and S2.  There are no clicks or significant murmurs.  ABDOMEN:  Soft.  She has no peripheral edema.   No labs were done today.   The patient has a cardiomyopathy.  She is on the appropriate  medications.  She is stable.  I will see her back in 6 months.  We will  arrange for her ICD followup to be done here in the [facility].     [name], MD, [facility]  Electronically Signed    JDK/MedQ  DD: 09/30/2008  DT: 10/01/2008  Job #: [id number]

## 2011-03-08 NOTE — Assessment & Plan Note (Signed)
[facility]                          [facility]   [name], FERRAIOLO                         MRN:          [id number]  DATE:07/05/2007                            DOB:          [birth year]    Patient has cardiomyopathy, and we have been titrating her meds.  Most  recently, we increased her carvedilol up to 12.5 b.i.d.  She is  tolerating this and doing well without any significant problems.  She is  here today for followup.   PAST MEDICAL HISTORY:  Other medical problems, see the list below.   ALLERGIES:  No known drug allergies.   MEDICATIONS:  Aspirin, Synthroid, Protonix, Lotensin, and carvedilol  12.5 b.i.d. (to be increased today).   REVIEW OF SYSTEMS:  She has slight discomfort in her left knee.  She  asked if she could use ibuprofen.  I encouraged her to use as small an  amount as possible and that Tylenol would be performed.  Otherwise, her  review of systems is negative.   PHYSICAL EXAMINATION:  Blood pressure is 125/84 with a pulse of 66.  Weight is 193 pounds.  This is stable.  Patient is oriented to person, time, and place.  Affect is normal.  HEENT:  No xanthelasma.  She has normal extraocular motion.  NECK:  There are no carotid bruits.  There is no jugular venous  distention.  LUNGS:  Clear.  Respiratory effort is not labored.  CARDIAC:  An S1 with an S2.  There are no clicks or significant murmurs.  ABDOMEN:  Soft.  She has normal bowel sounds.  EXTREMITIES:  There is no peripheral edema.   No labs are done today.   PROBLEMS:  Problems are listed in my note of June 07, 2007.  Cardiomyopathy:  We continue to titrate her medicines up.  When these  are completely titrated, we will recheck her 2D echo.   Patient is stable at this time.  Her meds are being changed, as  outlined.     [name], MD, [facility]  Electronically Signed    JDK/MedQ  DD: 07/05/2007  DT: 07/05/2007  Job #: [id number]   cc:   Prudy Feeler, NP

## 2011-03-08 NOTE — Assessment & Plan Note (Signed)
[facility]                          [facility]   [name], [name]                         MRN:          [id number]  DATE:10/04/2007                            DOB:          1961/06/23    PRIMARY CARDIOLOGIST:  Dr. Willa Rough.   REASON FOR VISIT:  Scheduled clinic followup.   Patient continues to do extremely well from a clinical standpoint since  being diagnosed with severe cardiomyopathy (EF 20%) in November 2007, at  [facility], when she presented with symptoms consistent with  acute TIA.  She was managed by the stroke team, who found no evidence of  acute changes by head CT or brain MRI, and recommended continued  treatment with aspirin and statin.  Of note, a 2D echocardiogram at that  time revealed severe LVD (EF 25%), and mild mitral regurgitation.  Patient was seen in consultation at that time, by Dr. Willa Rough.   She has since done well with medication adjustment and up titration,  including Coreg to the current dose of 25 mg daily.  She is also on  aspirin and an ACE inhibitor.   Patient denies any chest pain and reports no symptoms suggestive of  decompensated heart failure; she also denies any tachypalpitations.   Patient's cardiac risk factors are notable only for family history of  coronary artery disease; she denies any history of hypertension,  diabetes mellitus, or tobacco smoking.  She did have a lipid profile  last year, notable for total cholesterol of 180, triglycerides 68, HDL  56 and LDL 111.  A more recent lipid profile in January of this year,  however, revealed an HDL of 70 with an LDL of 95.   CURRENT MEDICATIONS:  1. Aspirin 325 daily.  2. Benazepril 10 daily.  3. Levothyroxine 0.1 mg daily.  4. Carvedilol 25 b.i.d.   PHYSICAL EXAMINATION:  Blood pressure 116/70, pulse 53 and regular,  weight 199.  GENERAL:  A 50 year old female sitting upright in no distress.  HEENT:   Normocephalic/atraumatic.  NECK:  Palpable, bilateral carotid pulses without bruits; no JVD.  LUNGS:  Clear to auscultation all fields.  HEART:  Regular rate and rhythm (S1-S2), no significant murmurs, no S3.  ABDOMEN:  Soft, nontender.  EXTREMITIES:  Intact pulses without edema.  NEURO:  No focal deficit.   IMPRESSION:  1. Cardiomyopathy.      a.     Ejection fraction 20% by 2D echo, November 2007.  2. Status post transient ischemic attack.      a.     Complete resolution of symptoms.  3. Treated hypothyroidism.  4. Dyslipidemia/elevated HDL.   PLAN:  A 2D echocardiogram for reassessment of left ventricular  function.  If this shows normalization of LVF, then no further cardiac  workup is indicated.  If, however, this shows persistent LV dysfunction,  then we will discuss further workup at time of her next scheduled clinic  visit in the ensuing few weeks.      Gene Serpe, PA-C  Electronically Signed      Learta Codding,  MD,FACC  Electronically Signed   GS/MedQ  DD: 10/04/2007  DT: 10/05/2007  Job #: [id number]   cc:   Prudy Feeler, NP

## 2011-03-08 NOTE — Assessment & Plan Note (Signed)
[name]                          [facility]   [name] [name], [name] [name]                         MRN:          [id number]  DATE:12/04/2007                            DOB:          05-31-1961    .   [name] is seen for cardiology follow-up.  I saw her last on October 04, 2007.  At that time, we arranged for a follow-up 2-D echo.  There  has been some improvement in her LV function, but it has not normalized.  She has global left ventricular hypokinesis.  Ejection fraction is in  the 30-35% range.  There was mild mitral regurgitation.  Right  ventricular function was good.   She returns today for further discussion.  She is on appropriate  medications for her left ventricular dysfunction.  She is fully active  and feeling well.   PAST MEDICAL HISTORY:   ALLERGIES:  NO KNOWN DRUG ALLERGIES.   MEDICATIONS:  1. Aspirin.  2. Benazepril 10.  3. Thyroid 100 mcg.  4. Carvedilol 25 b.i.d.   OTHER MEDICAL PROBLEMS:  See the list below.   REVIEW OF SYSTEMS:  She is really feeling well.   REVIEW OF SYSTEMS:  Is negative.   PHYSICAL EXAMINATION:  Blood pressure is 107/71 with a pulse of 68.  The  patient is oriented to person, time and place.  Affect is normal.  HEENT:  Reveals no xanthelasma.  She has normal extraocular motion.  There are no carotid bruits.  There is no jugular venous distention.  LUNGS:  Are clear.  Respiratory effort is not labored.  CARDIAC EXAM:  Reveals a S1 with a S2.  There are no clicks or  significant murmurs.  ABDOMEN:  Is soft.  She has no peripheral edema.   PROBLEMS INCLUDE:  1. Gastroesophageal reflux disease.  2. History of seasonal allergies.  3. Barrett's esophagus.  4. Hypothyroidism.  5. Mild elevation of her LDL.  6. Cardiomyopathy.  Etiology is not known.  We have not done a cardiac      catheterization.  With careful medication adjustments, she is      feeling very well.  Her ejection  fraction has improved from 20-25%      up to 30-35%, but clearly it has not normalized.  7. History of a transient ischemic attack. She presented with a      transient ischemic attack to [facility] in November 2007.  There      was no evidence of acute change on the CT or on brain MRI, and it      was recommended that she receive aspirin.  It was also recommended      that she receive a statin.  After my review today, I realized that      even though our plan had been for her to be on a statin she is not      listed on one today.  I will be in touch with her about this.  8. Ejection fraction, previously in the range of 20%, now to 30-35%.  I have had a complete discussion with the patient about potential  further workup.  Because she has now been titrated nicely on her  medications and her ejection fraction remains in the 30-35% range, we  need to consider whether an ICD placement is appropriate.  She has  agreed to be seen in consultation.  I am arranging for her to see Dr.  Graciela Husbands in the [place] office.  I made it clear to her that we would  have good talks about this going forward to make the best decisions  possible.   She is doing well.  We will be in touch with her about starting a statin  unless there is a clear reason in the past why she is not receiving it.     [name], MD, [facility]  Electronically Signed    JDK/MedQ  DD: 12/04/2007  DT: 12/05/2007  Job #: [id number]   cc:   [name], MD, [facility]  Prudy Feeler, N.P.

## 2011-03-08 NOTE — Assessment & Plan Note (Signed)
 HEALTHCARE                         ELECTROPHYSIOLOGY OFFICE NOTE   GRACYNN, RAJEWSKI                         MRN:          [id number]  DATE:04/21/2008                            DOB:          1961/01/23    HISTORY:  Mrs. Rumbaugh was seen following an ICD implantation for  nonischemic cardiomyopathy.  She has done well.  She is back at work.   MEDICATIONS:  1. Benazepril 20.  2. Coreg 25 b.i.d.  3. Levothyroxine.  4. Simvastatin.  5. Omeprazole.   PHYSICAL EXAMINATION:  VITAL SIGNS:  Her blood pressure is 130/90.  Her  pulse was 68.  LUNGS:  Clear.  HEART:  Heart sounds were regular.  EXTREMITIES:  Without edema.  Her device spot was well-healed with a  small keloid.   Interrogation of her device demonstrates some atrial far-field over  sensed events which were addressed by reprogramming the PVAB from  smart 85 milliseconds.   IMPRESSION:  1. Nonischemic cardiomyopathy.  2. History of bradycardia.  3. No atrial or ventricular pacing.  4. Artifactual atrial tachy response events hopefully addressed by the      reprogramming is noted.   PLAN:  Mrs. Shibley will see Dr. Myrtis Ser again about 3 months' time.  I will  continue to try to get her device interrogated for her at the same time.     [name], MD, [facility]  Electronically Signed    SCK/MedQ  DD: 04/21/2008  DT: 04/22/2008  Job #: [id number]   cc:   [name], MD, [facility]

## 2011-03-08 NOTE — Assessment & Plan Note (Signed)
[facility]                          [facility]   [name] [name], MUNNS                         MRN:          [id number]  DATE:08/20/2008                            DOB:          09/07/1961    [name] [name] has LV dysfunction.  She is here for followup.  She is doing  reasonably well.  However, she does have some complaints.  She notices a  twitching sensation in her arm from time to time.  There is no other  specific information about this.  She has some pain in her right foot  intermittently.  She has a cough.  She describes it as a dry cough and  this may well be related to her ACE inhibitor.  She feels that she has  gained weight, although her weight is stable on our scales.   PAST MEDICAL HISTORY:   ALLERGIES:  No known drug allergies.   MEDICATIONS:  1. Aspirin.  2. Benazepril 10.  3. Thyroid 100.  4. Carvedilol 25 b.i.d.  5. Simvastatin 20 mg 2 times weekly.   OTHER MEDICAL PROBLEMS:  See the list on my note of December 04, 2007.   REVIEW OF SYSTEMS:  She is not having any GI or GU symptoms.  She has no  fever or chills.  Her review of systems otherwise is negative.   PHYSICAL EXAMINATION:  VITAL SIGNS:  Blood pressure is 104/80 with a  pulse of 64.  Weight is 197 pounds.  NEUROLOGIC:  The patient is oriented to person, time, and place.  Affect  is normal.  HEENT:  No xanthelasma.  She has normal extraocular motion.  NECK:  There are no carotid bruits.  There is no jugular venous  distention.  LUNGS:  Clear.  Respiratory effort is not labored.  CARDIAC:  S1 with an S2.  There are no clicks or significant murmurs.  ABDOMEN:  Soft.  EXTREMITIES:  She has no peripheral edema.   PROBLEMS:  Problems are listed on my note of December 04, 2007.  An  additional problem now is a cough.  This may be related to benazepril.  We will put her benazepril on hold for 2 or 3 weeks and then see her  back and then decide if we should change  her to an ARB.    [name] Abed, MD, [facility]  Electronically Signed   JDK/MedQ  DD: 08/20/2008  DT: 08/21/2008  Job #: [id number]

## 2011-03-08 NOTE — Discharge Summary (Signed)
[name] [name], [name]                  ACCOUNT NO.:  [id number]   MEDICAL RECORD NO.:  [id number]          PATIENT TYPE:  INP   LOCATION:  4731                         FACILITY:  [facility]   PHYSICIAN:  Madolyn Frieze. Jens Som, MD, FACCDATE OF BIRTH:  1961-08-19   DATE OF ADMISSION:  01/11/2008  DATE OF DISCHARGE:  01/12/2008                         DISCHARGE SUMMARY - REFERRING   DISCHARGE DIAGNOSES:  1. Dilated cardiomyopathy.  2. Status post ICD placement by Dr. Graciela Husbands.  3. History of hypothyroidism.   PROCEDURES PERFORMED:  Guidant ICD placement on January 11, 2008 by Dr.  Graciela Husbands.   Ms. Hemler is a 50 year old African-American female who was found to have  a cardiac myopathy when she presented with a TIA approximately 15 months  ago.  After intensive treatment with medications, there has only been  modest improvement in her ejection fraction, which remained throughout  33%.  She continues to have shortness of breath with minimal activity.  Dr. Graciela Husbands saw the patient on December 21, 2007 and felt that she may  benefit from ICD placement.   LABORATORY:  Weights were not documented on admission.  There were not  any labs performed while in the hospital.  Chest x-ray is pending at the  time of this dictation.  EKGs show sinus bradycardia, normal axis,  delayed R-wave, nonspecific ST-T wave changes.   HOSPITAL COURSE:  Dr. Graciela Husbands performed a Guidant ICD placement.  Post  placement device was interrogated on the 21st by Otto Herb with  [employer].  On March 21, Dr. Jens Som felt that the patient  could be discharged home, if her chest x-ray did not show any  abnormalities.  Dr. Jens Som did increase her Lotensin to 20 mg given  her blood pressure and cardiomyopathy and felt that she should have a  BMET next week and follow up as outlined by Dr. Graciela Husbands.   DISPOSITION:  Prior to being discharged, the x-ray results will be  called to me, if they do not show any evidence of active issues or  pneumothorax.  The patient will be verbally discharged home.  Wound care  and activities are per supplemental sheet.  She was asked to maintain a  low-sodium, heart-healthy diet.   MEDICATIONS:  1. She received a new prescription for Lotensin 20 mg daily.  2. She was asked to continue coated aspirin 325 mg daily.  3. Synthroid 0.1 mg daily.  4. Protonix 40 mg daily.  5. Coreg 25 mg b.i.d.  6. Simvastatin 20 mg q.h.s..   She would need a wound and pacer check in approximately 10 days in the  [place] office, a 6-week follow-up with Dr. Jonita Albee, with Dr. Myrtis Ser in  the [place] office and a 12-week follow-up with Dr. Graciela Husbands.  Dr. Jens Som  has asked  for BMET to be done next week, since Lotensin was increased.  She was  asked to bring all medicines and all weights to all appointments.   DISCHARGE TIME:  Forty five minutes.      [name] Rued, PA-C      Madolyn Frieze Jens Som, MD, [facility]  Electronically  Signed    EW/MEDQ  D:  01/12/2008  T:  01/12/2008  Job:  [id number]   cc:   Madolyn Frieze. Jens Som, MD, [facility]  [name] [name], MD, [facility]  [name] Abed, MD, [facility]

## 2011-03-08 NOTE — Assessment & Plan Note (Signed)
[facility]                          [facility]   [name] [name]                         MRN:          [id number]  DATE:06/02/2009                            DOB:          1961-07-05    [name] [name] is here for cardiology followup.  She has a significant  cardiomyopathy and has an ICD in place.  She has done well with this.  She is not having any significant chest pain.  She has not had any  marked palpitations.  She is worried about weight gain.  She does not  appear to be fluid overloaded.  In talking with her about her calorie  intake, she clearly drinks a lot of sweet tea and other things  containing significant calories and we discussed this.   The patient also mentions that she has had some swelling in her right  leg more than left when standing after 12 hours of work.  I have  recommended support hose to her.   The patient also has a history of hypothyroidism.  Her labs have been  checked by Prudy Feeler, PAC and this is stable.   The patient had a TIA in the past and all symptoms have resolved.   PAST MEDICAL HISTORY:   ALLERGIES:  The patient may be allergic to [facility].  She recently had a  course of this and said that she had joint pain.  The joint pain  resolved.   MEDICATIONS:  Aspirin, thyroid, carvedilol 25 b.i.d., benazepril 20, and  omeprazole.   OTHER MEDICAL PROBLEMS:  See the list below.   REVIEW OF SYSTEMS:  The patient denies any fevers, chills, skin rashes,  or sweats.  She denies headaches.  There is no change in vision or  hearing.  She has no chest pain.  There is no shortness of breath.  She  has a mild cough.  She is on an ACE inhibitor.  We have discussed this  before and she prefers to remain on the ACE without changing to any  other medications.  Now that there are generic ARB's, we will rediscuss  this again in the future.  The patient is not having any GI or GU  symptoms.  As mentioned, she  has some joint aches around the time of  Levaquin therapy.  All other systems are reviewed and are negative.   PHYSICAL EXAMINATION:  VITAL SIGNS:  Blood pressure is 124/77 with a  pulse of 64.  The patient weighs 204 pounds.  This is up 4 pounds since  her last visit.  GENERAL:  She is oriented to person, time, and place.  Affect is normal.  HEENT:  No xanthelasma.  She has normal extraocular motion.  NECK:  There are no carotid bruits.  There is no jugular venous  distention.  LUNGS:  Clear.  Respiratory effort is not labored.  CARDIAC:  S1 and S2.  There are no clicks or significant murmurs.  ABDOMEN:  Soft.  There are no masses or bruits.  EXTREMITIES:  She has no significant peripheral edema at this time.  PROBLEMS:  #1.  Gastroesophageal reflux disease.  #2.  History of seasonal allergies.  #3.  Barrett esophagus.  #4.  Hypothyroidism.  #5.  Mid elevation in LDL.  The patient does not want to use a statin.  #6.  Cardiomyopathy.  Etiology is unknown.  Her ejection fraction did  improve from 20-25% up to the range of 30-35% over time.  However, it is  not normalized completely.  She does have an implantable cardioverter-  defibrillator in place.  #7.  History of an implantable cardioverter-defibrillator.  #8.  Transient ischemic attack.  There was no evidence of acute change  on her CT or her MRI.  It was recommended that she receive aspirin.  It  was also recommended that she receive a statin, but once again she is  hesitant.  #9.  Mild swelling in her right leg when standing.  I suspect that this  is a venous issue.  I did recommend support hose.   There is a possibility that she has slight volume overload.  She tells  me that she was told by others that she should drink more fluid.  I  discussed this with her concerning her fluid and salt intake in general  and she will cut back to more moderate fluid intake.  I am not inclined  to add a diuretic at this point.   I will  see her for followup in 6 months.     [name], MD, [facility]  Electronically Signed    JDK/MedQ  DD: 06/02/2009  DT: 06/03/2009  Job #: [id number]   cc:   Prudy Feeler, [facility]

## 2011-03-08 NOTE — Letter (Signed)
December 21, 2007    [name] Abed, MD, [facility]  [address]  [place], [place] [address]   RE:  [name] [name]  MRN:  [id number]  /  DOB:  Dec 17, 1960   Dear [name] [name]:   It was a pleasure to see [name] [name] at your request for consideration of  ICD implantation.   As you know, she is a 50 year old [place] American woman who presented  with a TIA about 15 months ago.  During the evaluation she was found to  have a cardiomyopathy with global hypokinesis and presumably Myoview  scanning was negative as you mentioned in your note that she did not  have catheterization.  She has undergone intensive and appropriate  medical therapy with ACE inhibitors and beta-blockers at very good  doses; unfortunately, this has only been associated with a modest  improvement in her ejection fraction with this most recent assessment  demonstrating about 30%.   She continues to have significant symptoms.  She is unable to climb a  flight of stairs without shortness of breath.  She does not however have  nocturnal dyspnea, orthopnea or peripheral edema.  She does have  palpitations but these are not rapid.  She has had no syncope.   PAST MEDICAL HISTORY:  Her past medical history is notable for 1)  treated hypothyroidism secondary to thyroidectomy, 2) GE reflux disease  with accompanying Barrett's esophagus, 3) dyslipidemia.   PAST SURGICAL HISTORY:  Her past surgical history is as noted above as  well as popliteal cyst and tubal ligation.   REVIEW OF SYSTEMS:  Her medical review of systems is notable for what  she describes as chills that occur at night lasting a couple of minutes.  They have become increasingly frequent over the year.  Her review of  systems is otherwise broadly negative across multiple organ systems.   SOCIAL HISTORY:  She has two children 19 and 14.  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  She does not use cigarettes, alcohol  or recreational drugs.   MEDICATIONS CURRENTLY:  1. Include Coreg 25 b.i.d.  2. Lotensin 10.  3. Synthroid 100 mcg.  4. Protonix.  5. Aspirin.  6. Simvastatin 20.   ALLERGIES:  She has no known drug allergies.   PHYSICAL EXAMINATION:  GENERAL:  She is a middle-aged [place] American  female appearing her stated age of 74.  VITAL SIGNS:  Her blood pressure was 121/76 with a pulse of 59.  HEENT:  Exam demonstrated no icterus nor xanthoma.  NECK:  The neck veins were flat.  The carotids are brisk and full  bilaterally without bruits.  BACK:  The back was without kyphosis or scoliosis.  LUNGS:  Clear.  HEART:  Sounds were regular without murmurs or gallops.  ABDOMEN:  The abdomen was soft with active bowel sounds without midline  pulsation or hepatomegaly.  EXTREMITIES:  Femoral pulses were 2+, distal pulses were intact.  There  is no clubbing, cyanosis or edema.  NEUROLOGICAL:  Grossly normal.  SKIN:  Was warm and dry   Electrocardiogram was notable for narrow QRS with intervals of  0.18/0.08/0.42.  She was modestly bradycardic with a heart rate of 58.   IMPRESSION:  1. Cardiomyopathy presumed nonischemic with persistent left      ventricular dysfunction.  2. Narrow QRS.  3. Class II to III congestive heart failure.  4. Nocturnal chills, question related to menopause.  5. Modest bradycardia.  6.  Prior transient ischemic attack.   [name] [name], [name] [name] has persistent LV dysfunction despite very good medical  therapy.  She has modest symptoms and I wonder to what degree they may  be ameliorated by the addition of spironolactone.   As relates to her risk of sudden death the trials would certainly  support your concern about ICD implantation and while anecdotes do not  mean anything we saw a young man earlier today whose primary prevention  ICD and nonischemic myopathy was life-saving this week.  I reviewed the  above with her.  We have reviewed the potential benefits as well as the  potential limitations  including the risks of the procedure implantation  not limited to death, perforation, device malfunction and infection and  lead dislodgement as well as its interactions with her environment.  She  understands these risks.  She would like to schedule it around her work  schedule.   In addition, we will plan to get a blood test to help assess the status  of her menopausal status to see if that does not explain these night  sweats episodes that she is having.   Thank you very much for the consultation.    Sincerely,      [name] Salvia, MD, [facility]  Electronically Signed    SCK/MedQ  DD: 12/21/2007  DT: 12/22/2007  Job #: [id number]

## 2011-03-08 NOTE — Assessment & Plan Note (Signed)
[facility]                          [place] [facility] NOTE   [name] [name]                         MRN:          [id number]  DATE:08/07/2007                            DOB:          1961/08/02    HISTORY:  [name] [name] is here for cardiology followup.  I have been trying  to titrate up her medicines for her cardiomyopathy.  I saw her last on  July 05, 2007.  At that time I increased her carvedilol to 12.5 mg  twice daily.  Since that time, she is doing relatively well.  She has  gained 3 pounds.  We talked very carefully about this, and I suspect  this is real weight, as opposed to fluid weight.  She does mention that  she may feel some slight shortness of breath when leaning over.  Some of  her clothes are tighter; however, she is not having PND or orthopnea.  She has not had any syncope or pre-syncope.   ALLERGIES:  No known drug allergies.   MEDICATIONS:  1. Aspirin.  2. Synthroid.  3. Lotensin 10 mg daily.  4. Carvedilol 12.5 mg twice daily.   PAST MEDICAL HISTORY:  See the list on my note of June 07, 2007.   REVIEW OF SYSTEMS:  See the HPI.   PHYSICAL EXAMINATION:  VITAL SIGNS:  Blood pressure 110/78, pulse 76,  weight 196 pounds.  GENERAL:  The patient is oriented to person, time and place.  Affect is  normal.  HEENT:  No xanthelasma.  She has normal extraocular motion.  NECK:  No carotid bruits, no jugular venous distention.  LUNGS:  Clear.  Respiratory effort is not labored.  HEART:  An S1 and S2.  There are no clicks or significant murmurs.  ABDOMEN:  Soft, normal bowel sounds.  EXTREMITIES:  She has no significant peripheral edema.  MUSCULOSKELETAL:  No deformities.   PROBLEMS:  Are listed on my note of June 07, 2007.  #6.  Cardiomyopathy.   PLAN:  We will not change her medicines today.  She is going to try to  lose some real weight and I will see her back in several weeks.  I also  need to absolutely  re-document the Carvedilol dose that she is taking.     [name], MD, [facility]  Electronically Signed    JDK/MedQ  DD: 08/07/2007  DT: 08/07/2007  Job #: [id number]   cc:   Prudy Feeler, N.P.

## 2011-03-08 NOTE — Assessment & Plan Note (Signed)
[facility]                            CARDIOLOGY OFFICE NOTE   [name], [name]                         MRN:          [id number]  DATE:02/20/2008                            DOB:          10-Dec-1960    [name] is seen for cardiology follow-up.  Since seeing her last, she  has had an ICD placed.  She is doing well.  She has some mild soreness  at the ICD site and into her back.  Overall her situation has been  stable.  The procedure was done on January 11, 2008.  She has nonischemic  cardiomyopathy and bradycardia.  She is having some shortness of breath.  Over time I wonder if her ICD can be adjusted to help her further.   PAST MEDICAL HISTORY:   ALLERGIES:  No known drug allergies.   MEDICATIONS:  Aspirin, Synthroid, carvedilol 25 b.i.d., simvastatin and  Lotensin.   OTHER MEDICAL PROBLEMS:  See the list on my note of December 04, 2007.   REVIEW OF SYSTEMS:  She is doing well.  She says that one of her pieces  of information raised the question of coronary disease.  I made it clear  to her that we do not have documentation of coronary disease.  She has  never been cathed.  Otherwise, review of systems is negative.  The  patient also mentioned she is having some tingling in her knees.  Etiology is not clear to me.   PHYSICAL EXAMINATION:  VITAL SIGNS:  Blood pressure is 112/72 with a  pulse of 56.  GENERAL APPEARANCE:  The patient is oriented to person, time and place.  Affect is normal.  HEENT:  No xanthelasma.  She has normal extraocular motion.  LUNGS:  Clear.  ICD site is nicely healed.  CARDIAC:  Exam reveals an S1 with an S2.  There are no clicks or  significant murmurs.  EXTREMITIES:  She has no peripheral edema.   LABORATORY DATA:  She is in sinus rhythm today.   PROBLEMS:  Listed on my note of December 04, 2007.  #9.  Status post Guidant implantable cardioverter defibrillator.  She is  doing well and I will see her back in the  [place] office in three months.     [name], MD, [facility]  Electronically Signed    JDK/MedQ  DD: 02/20/2008  DT: 02/20/2008  Job #: [id number]   cc:   Aniceto Boss, N.P.

## 2011-03-08 NOTE — Assessment & Plan Note (Signed)
[facility]                          [facility] [facility]   [name], [name]                         MRN:          [id number]  DATE:06/07/2007                            DOB:          24-Feb-1961    [name] [name] is doing well.  She has cardiomyopathy.  We are adjusting her  medicines.  I had seen her in the [place] office previously, but she  lives closer here.  There has been some difficulty in getting her back  for early followup visits, and I talked with her about this and we will  work harder on this.  It is important that we finish titrating her  medications up and then consider repeat 2-D echocardiogram.  She is on  carvedilol 6.25 b.i.d. and Lotensin 10 daily.  She is not having  shortness of breath, chest pain, syncope, or presyncope.   ALLERGIES:  NO KNOWN DRUG ALLERGIES.   MEDICATIONS:  Aspirin, Synthroid, Protonix, carvedilol 6.25 b.i.d.,  Lotensin 10, and Garnette Scheuermann which is a birth control pill to help with her  periods that will be stopped soon according to the patient.   OTHER MEDICAL PROBLEMS:  See the list below.   REVIEW OF SYSTEMS:  She feels well.  She is working long hours.  She  mentioned that recently she was in the sun and had more of a sunburn-  type of reaction that she had had in the past and asked if it was any of  her current medications.  This can be reviewed, but none of the  medications are a classic problem that I am aware of.   Otherwise, her review of systems is negative.   PHYSICAL EXAMINATION:  VITAL SIGNS:  Blood pressure 113/73, pulse 70.  GENERAL:  The patient is oriented to person, time, and place, and her  affect is normal.  HEENT:  No xanthelasma.  She has normal extraocular motion.  NECK:  There are no carotid bruits.  There is no jugular venous  distension.  LUNGS:  Clear.  Respiratory effort is not labored.  CARDIAC:  S1 with an S2.  There are no clicks or significant murmurs.  ABDOMEN:  Soft.   She has no masses or bruits.  EXTREMITIES:  She has no significant peripheral edema.  There are no  significant musculoskeletal deformities.   PROBLEMS:  1. Gastroesophageal reflux disease.  2. History of seasonal allergies.  3. Barrett's esophagus by history.  4. Hypothyroidism, treated.  5. Mild low-density lipoprotein elevation.  6. Cardiomyopathy.  We will continue to titrate her medicines by      increasing her carvedilol to 12.5 b.i.d.  I will see her back in 1      month.  7. Status post left brain transient ischemic attack that was not      related to any type of proven clot.  8. Some type of reaction to the sun.   The patient's carvedilol dose will be increased, and I will see her back  in 4 weeks.     [name], MD, [facility]  Electronically Signed  JDK/MedQ  DD: 06/07/2007  DT: 06/08/2007  Job #: [id number]   cc:   Prudy Feeler, N.P.  Samuel Jester

## 2011-03-08 NOTE — Op Note (Signed)
NAMEAZARIAH, LATENDRESSE                  ACCOUNT NO.:  [id number]   MEDICAL RECORD NO.:  [id number]          PATIENT TYPE:  INP   LOCATION:  2899                         FACILITY:  [facility]   PHYSICIAN:  [name], MD, FACCDATE OF BIRTH:  07-28-1961   DATE OF PROCEDURE:  01/11/2008  DATE OF DISCHARGE:                               OPERATIVE REPORT   PREOPERATIVE DIAGNOSES:  Nonischemic cardiomyopathy and bradycardia.   POSTOPERATIVE DIAGNOSES:  Nonischemic cardiomyopathy and bradycardia.   PROCEDURE:  Dual-chamber defibrillator implantation with intraoperative  defibrillation threshold testing.   COMPLICATIONS:  Passage of the defibrillator lead into what was probably  the azygos vein and then subsequent withdrawal.   Following obtaining informed consent, the patient was brought to the  electrophysiology laboratory and placed on the fluoroscopic table in a  supine position.  After routine prep and drape of the left upper chest,  lidocaine was infiltrated in the prepectoral subclavicular region.  Incision was made and carried down to the layer of the prepectoral  fascia.  Using electrocautery and sharp dissection a pocket was formed  similarly.  Hemostasis was obtained.   Thereafter, attention was turned to gaining access to the extrathoracic  left subclavian vein which was accomplished without difficulty without  the aspiration or air or puncture of the artery.  Two separate  venipunctures were accomplished.  Guidewires were placed and retained  and a 0 silk suture was placed in a figure-of-eight fashion and allowed  to hang loosely.   Sequentially 8-French and 7-French sheaths were placed through which was  passed a Guidant 0158- [id number] active fixation dual coil defibrillator  lead and a Medtronic 5076 52 cm active fixation atrial lead, serial #  [id number].  Under fluoroscopic guidance they were manipulated to the  right ventricular apical septum with a moderate amount of  difficulty in  the right atrial appendage where the respective amplitudes were 8.5 mV  and V width impedance of 650, a threshold of 0.6 volts at 0.5 ms,  current of threshold was 1.0 mA.  There was no diaphragmatic pacing at  10 volts and the current of injury was brisk.   The bipolar P-wave was 1.9 with a pace impedance of 952 ohms, threshold  of 0.5 volts at 0.5 ms, and the current threshold was 1.4 mA.  The  current of injury was brisk and there was no diaphragmatic pacing at 10  volts.  These leads were secured to the prepectoral fascia.  I should  note that the RV lead had to be repositioned on a couple of occasions.  These leads were secured to the prepectoral fascia and then attached to  a Guidant Vitality 2 ICD, model 2165, serial [id number].  Through the  device the bipolar R-wave was 15 with a pace impedance of 561, a  threshold 0.4 volts at 0.5 ms, the bipolar P-wave was 2.3 with a pace  impedance of 846 and a threshold of 0.6 volts at 0.5 ms.  High-voltage  impedance was 43 ohms.   These leads were secured to the prepectoral fascia.  These leads having  been secured were then placed with the device into the prepectoral  pocket for defibrillation threshold testing.  Ventricular fibrillation  was induced via a T-wave shock.  After a total duration of 5 seconds a  14 joule shock was delivered through a measured resistance of 35 ohms,  terminating ventricular fibrillation and restoring sinus rhythm.   The pocket was copiously irrigated with antibiotic containing saline  solution.  Hemostasis was assured.  The leads of the pulse generator  were secured.  The wound was then closed in 3 layers in the normal  fashion.  The wound was washed and dried and a benzoin and Steri-Strip  dressing was applied.  Needle counts, sponge counts, and instrument  counts were correct at the end of the procedure according to the staff.  The patient tolerated the procedure without apparent  complication.      [name], MD, [facility]  Electronically Signed     SCK/MEDQ  D:  01/11/2008  T:  01/11/2008  Job:  [id number]   cc:   Electrophysiology Laboratory  [name], MD, [facility]  [facility]

## 2011-03-11 NOTE — H&P (Signed)
[name] [name], [name] [name]                  ACCOUNT NO.:  [id number]   MEDICAL RECORD NO.:  [id number]          PATIENT TYPE:  INP   LOCATION:  3036                         FACILITY:  [facility]   PHYSICIAN:  Casimiro Needle L. Reynolds, M.D.DATE OF BIRTH:  03-Jul-1961   DATE OF ADMISSION:  09/19/2006  DATE OF DISCHARGE:                              HISTORY & PHYSICAL   CHIEF COMPLAINT:  Code stroke.   HISTORY OF PRESENT ILLNESS:  This is the initial [place] stroke  service admission for this 50 year old woman with a past medical history  which includes gastroesophageal reflux disease, hypothyroidism.  The  patient reports that she awoke this morning feeling normal.  She was up  ironing her clothes at approximately 5:30 this morning when she  experienced sudden onset of right extremity weakness.  She said that she  fell to the ground and her right arm wouldn't work.  Her family  alerted EMS who reported on the scene that she had slow speech and she  had clumsy movements in the right upper and lower extremities.  She  was brought to the [facility] emergency room and a code stroke was  called en route.  After she arrived at the emergency room, she was  evaluated and her right sided symptoms had improved, although she  continued to have disturbed speech.  At this time, she said that she has  a small headache on the right side of her head and mild neck pain.  She says that she had similar symptoms to this several years ago, was  seen at [facility] and was discharged without deficit.  This happened over 10  years ago and she is not really sure what, if any, diagnosis was  rendered at that time.  Other than that, she has not had any similar  recent symptoms.   PAST MEDICAL HISTORY:  Is remarkable for hypothyroidism for which she  sees Dr. Juleen China, and gastroesophageal reflux disease for which she sees  Dr. Virginia Rochester, and has undergone a few endoscopies and biopsies for Barrett's  esophagus.  She has no known vascular  risk factors.   FAMILY HISTORY:  Is remarkable for coronary artery disease in her  mother.   SOCIAL HISTORY:  She says she does not smoke.  She works as a Glass blower/designer.  She lives at home with her son and daughter and is normally independent  in her activities of daily living.   ALLERGIES:  No known drug allergies.   MEDICATIONS:  Protonix, levothyroxine.   REVIEW OF SYSTEMS:  A full 10-system review of systems is negative  except as outlined in the HPI and in the accompanying admission nursing  record.   PHYSICAL EXAMINATION:  Vital signs:  Temperature 97.8, blood pressure  140/103, pulse 69, respirations 18.  GENERAL EXAMINATION:  This is a healthy appearing woman supine in a  hospital bed, no evident distress.  HEAD:  Cranium is normocephalic and atraumatic, oropharynx is benign.  NECK:  Is supple without carotid or supraclavicular bruits.  CHEST:  Clear to auscultation bilaterally.  HEART:  Regular rate and rhythm  without murmurs.  ABDOMEN:  Is soft, normal active bowel sounds.  EXTREMITIES:  No edema, 2+ pulses.  NEUROLOGIC EXAM:  Mental status:  She is awake, alert, fully oriented to  time, place, and person.  Memory, concentration, and attention span are  all intact.  Her speech is hesitant in a non organic fashion, but she  has no true dysarthria or aphasia.  Cranial nerves:  Pupils are equal  and reactive, extraocular movements full without nystagmus, visual  fields full to confrontation.  Hearing is intact to conversational  speech.  Face, tongue, and palate move normally and symmetrically.  Motor:  Normal in bulk and tone.  Normal strength, no tension to any  muscles.  Sensation symmetric to light touch and pinprick in all  extremities.  Coordination:  Rapid movements are performed well, finger  to nose performed well.  Gait is deferred.  Reflexes 2+ and symmetric.  Toes are downgoing bilaterally.   LABORATORY REVIEW:  CBC:  White count 6.0, hemoglobin 12.3, platelets   200,000.  B-met is normal.  Coag's are pending.  CT of the head is  personally reviewed and is normal.   IMPRESSION:  1. Transient right upper extremity weakness, questionable TIA given      the focal weakness.  The only finding that she has at this time is      a speech disturbance which does not appear to be organic in      etiology.  2. Possible hypertension.  3. Hypothyroidism.  4. Gastroesophageal reflux disease.   PLAN:  Will admit to the hospital for brief TIA work up, including MRI,  MRA, carotid and transcranial Dopplers, and 2D echocardiogram.  Will  treat with aspirin for now, pending the outcome of the work up.  Stroke  service to follow.  TPA was not given, given that the patient had an NIH  Stroke Scale score of 0 in the emergency department.      Michael L. Thad Ranger, M.D.  Electronically Signed     MLR/MEDQ  D:  09/19/2006  T:  09/19/2006  Job:  [id number]

## 2011-03-11 NOTE — Assessment & Plan Note (Signed)
[facility] HEALTHCARE                            CARDIOLOGY OFFICE NOTE   [name] [name], [name] [name]                         MRN:          [id number]  DATE:10/04/2006                            DOB:          10-21-1961    [name] [name] is seen for cardiac followup.  She was recently hospitalized  with a small stroke.  This was felt to be possibly related to some  intracranial medium sized and small sized irregularities representing  some atherosclerotic disease.  Aspirin was recommended.  There was no  proof of a definite clot.  Carotid Dopplers showed no osteostenosis.  A  2D echo, however, showed an ejection fraction in the 20 to 25% range.  She had global LV dysfunction.  There was mild mitral valve  regurgitation.  There was no prior cardiac history.  Thyroid was  checked, and her TSH was normal.  She is now improving.  She has some  very slight intermittent speech difficulties and some weakness on her  left side, but this is not a significant problem, and she has returned  to work.  She did have a TEE.  There was no obvious source for an  embolic stroke.  Consideration would be given to Coumadin therapy  because of her LV, and to Statin therapy.  She did have an LDL of 111,  and an HDL of 56.   Today in the office she is feeling well.   PAST MEDICAL HISTORY:   ALLERGIES:  NO KNOWN DRUG ALLERGIES.   MEDICATIONS:  As of today, she is on aspirin, Synthroid, and Protonix.  She may have been discharged on a Statin, but it is not yet recorded  today.   REVIEW OF SYSTEMS:  Her review of systems is negative other than the  HPI.  Patient has had some reflux.  She has some problems with some  constipation and fatigue at time.  She has had some seasonal allergies.  Otherwise, her review of systems is negative.   PHYSICAL EXAMINATION:  Her weight is 194 pounds.  Blood pressure today  is 128/87 with a pulse of 64.  Patient is oriented to person, time, and place and her  affect is normal.  HEENT:  Reveals no xanthelasma.  She has normal extraocular motion.  There are no carotid bruits.  There is no jugular venous distension.  CARDIAC:  Exam reveals an S1 with an S2.  There are no clicks or  significant or murmurs.  LUNGS:  Clear, respiratory effort is not labored.  ABDOMEN:  Soft, there are no masses or bruits.  She has no significant peripheral edema.  Distal pulses are normal.   EKG:  Today reveals a normal sinus rhythm.  She has poor anterior R-wave  progression, and this has been seen in the past.   PROBLEMS:  1. Gastroesophageal reflux disease.  2. History of some seasonal allergies.  3. Barrett's esophagus by history.  4. Hypothyroidism, treated with a normal TSH at this time.  5. Mild elevation of LDL.  6. Cardiomyopathy with an ejection fraction in the range of  20-25% by      echo, and this is a new diagnosis.  7. Status post left brain TIA, which is not related to a proven clot      at this time.   I had been concerned about lowering her blood pressure by using  medications for LV.  I have been reassured that this should not be a  significant problem.  Therefore, we will start today, Carvedilol 3.125  daily, to be increased to 3.5 b.i.d. in 2 or 3 days, to be followed by  increasing up to 6.25 and 6.25 b.i.d., and then I will see her back for  followup.  I have chosen not to start an ACE yet, but I will start it  next time.     [name], MD, [facility]  Electronically Signed    JDK/MedQ  DD: 10/04/2006  DT: 10/04/2006  Job #: [id number]   cc:   Pramod P. Pearlean Brownie, MD

## 2011-03-11 NOTE — Op Note (Signed)
NAMEMARSHA, [name] [name]                  ACCOUNT NO.:  [id number]   MEDICAL RECORD NO.:  [id number]          PATIENT TYPE:  AMB   LOCATION:  ENDO                         FACILITY:  [facility]   PHYSICIAN:  Georgiana Spinner, M.D.    DATE OF BIRTH:  Aug 30, 1961   DATE OF PROCEDURE:  06/09/2005  DATE OF DISCHARGE:                                 OPERATIVE REPORT   PROCEDURE:  Endoscopy.   INDICATIONS FOR PROCEDURE:  GERD.   ANESTHESIA:  Demerol 50, Versed 8 milligrams.   PROCEDURE:  With the patient mildly sedated in the left lateral decubitus  position, Olympus videoscopic endoscope was inserted and passed under direct  vision through the esophagus, into the stomach. Fundus, body, antrum,  duodenal bulb, second portion duodenum were visualized. From this point the  endoscope was slowly withdrawn taking circumferential views of the duodenal  mucosa until the endoscope was pulled back into the stomach and placed in  retroflexion to view the stomach from below.  The endoscope was straightened  and withdrawn taking circumferential views of the gastric and esophageal  mucosa stopping in the distal esophagus to biopsy of the gastroesophageal  squamocolumnar junction. The patient's vital signs and pulse oximeter  remained stable. The patient tolerated the procedure well without  complications.   FINDINGS:  Unremarkable examination with biopsies of the squamocolumnar  junction to rule out Barrett's.   PLAN:  Await biopsy report. The patient will call for further office follow-  up with me as an outpatient.           ______________________________  Georgiana Spinner, M.D.     GMO/MEDQ  D:  06/09/2005  T:  06/09/2005  Job:  [id number]

## 2011-03-11 NOTE — Procedures (Signed)
[facility]  Patient:    [name] [name], [name] [name]                         MRN: [id number] Proc. Date: 12/19/00 Adm. Date:  [id number] Attending:  Sabino Gasser                           Procedure Report  PROCEDURE:  Upper Endoscopy with biopsy.  GASTROENTEROLOGIST:  Sabino Gasser, M.D.  INDICATIONS:  Abdominal discomfort, rule out foreign body.  ANESTHESIA:  Demerol 20 mg, Versed 4 mg.  PROCEDURE IN DETAIL:  With the patient mildly sedated and in the left lateral decubitus position, the Olympus video endoscope was inserted in the mouth, passed under direct vision through the esophagus.  The distal esophagus was approached and showed some irregularities at the Z-line consistent with esophagitis above a hiatal hernia.  This was photographed and biopsied.  We entered into the stomach.  Fundus, body, antrum, duodenal bulb, and second portion duodenum were all well visualized and appeared normal.   The endoscope was then slowly withdrawn, taking circumferential views of entire duodenal mucosa until the endoscope had been pulled back into the stomach, placed in retroflexion to view the stomach from below, and a hiatal hernia was seen, photographed.  The endoscope was then straightened and withdrawn, taking circumferential views of remaining gastric and esophageal mucosa which otherwise appeared normal.  The patients vital signs and pulse oximetry remained stable.  The patient tolerated the procedure well with no apparent complications.  FINDINGS:   Changes of esophagitis and question of short segment of Barretts esophagus above a hiatal hernia.  PLAN:  Treat the reflux symptomatology as necessary and have patient follow up with me as an outpatient. DD:  12/19/00 TD:  12/19/00 Job: [id number]

## 2011-03-11 NOTE — Op Note (Signed)
[place]  Patient:    [name] [name], [name] [name]                         MRN: [id number] Proc. Date: 01/18/00 Adm. Date:  [id number] Attending:  Henrene Dodge CC:         Anselm Pancoast. Zachery Dakins, M.D.             Bernadene Person, M.D.                           Operative Report  PREOPERATIVE DIAGNOSIS:  Bilateral multinodular goiter.  POSTOPERATIVE DIAGNOSIS:  Probable multinodular goiter, await final pathology.  OPERATION:  Total thyroidectomy, bilateral.  SURGEON:  Anselm Pancoast. Zachery Dakins, M.D.  ASSISTANT:  Rose Phi. Maple Hudson, M.D.  ANESTHESIA:  General anesthesia.  HISTORY:  Jaleya Pebley is a 50 year old female, who was referred to me by Dr. Lacretia Nicks. Adela Lank for management of an enlarging multinodular goiter.  The patient had been on thyroid suppression for approximately 8 or 9 years.  The gland has just  kind of slowly improved inspite of adequate suppression.  She has had, I think,  four fine-needle aspirations with no evidence of any atypical cells and notified, but because of the enlarging gland, was referred to [place] for surgery.  Recently on examination, she has got basically, kind of like, two egg sized, each lobe, the  left may be a little large than the right that is definitely easy felt ______ she is having some symptoms of swallowing difficulty and kind of shortness of breath with activity because of the size of the gland.  Clinically, she is euthyroid and there were no cervical nodes.  I recommended that we proceed on with a bilateral thyroidectomy, essentially a total thyroidectomy and she was in agreement.  DESCRIPTION OF PROCEDURE:  The patient was taken to the operative suite. Preoperatively, she was given a g of Kefzol and has PAS stockings and positioned on the OR table.  The neck, after induction of general anesthesia, was prepped with Betadine surgical scrub and solution and draped in a sterile manner.  I marked he skin  incision area by two fingerbreadths from the manubrium.  Marked it off equally laterally and then marked it with a stitch or tie and then made the skin incision, developed superior and inferior flaps.  There was a little vein in the inferior  aspect on the right that required suturing and then the [place] retractor was inserted.  The strap muscles were separated in the midline and the left lobe, which was then mobilized.  The middle vein was identified and doubly hemoclipped and hen divided.  Then, the inferior and superior poles were freed up.  We identified what we thought were parathyroids and these were left behind and the vessels were taken right on the actual thyroid lobe.  We never truly visualized the recurrent laryngeal nerve, but at the little area, where it goes into the thyroid cartilage, we left a very small segment of thyroid tissue there only and this was about the size of your little fingernail at maximum.  The superior vessels were doubly tied with a 2-0 silk distally, the stay side singly, proximally ______ and divided.  And then the isthmus was separated from the thyroid cartilage.  Next, we shifted over and I switched sides to mobilize the left lobe.  It was not quite as large and a  little softer, but still significantly enlarged and I elected to go ahead and do a bilateral total lobectomy.  The superior pole was first divided between hemostats and then ligated with a 2-0 silk.  The inferior lobe was taken really right on he thyroid.  We identified two small parathyroids, which were left behind and the recurrent laryngeal nerve was likewise, we thought visualized, but not really dissected out and very minimal amount of thyroid tissue left right over where it goes into the thyroid cartilage also.  Good hemostasis had been obtained.  We used the little clips and some fine silk ties.  The wound was thoroughly irrigated and inspected bilaterally.  Then, the  strap were approximated with interrupted 3-0 Vicryl.  The platysma closed with interrupted 3-0 Vicryl and then the 5-0 Dexon  subcuticular and then benzoin and Steri-Strips on the skin.  The patient tolerated the procedure nicely and she was extubated and taken to recovery in a stable postoperative condition. DD:  01/18/00 TD:  01/18/00 Job: [id number]

## 2011-03-11 NOTE — Assessment & Plan Note (Signed)
[facility] HEALTHCARE                            CARDIOLOGY OFFICE NOTE   [name] [name], [name] [name]                         MRN:          [id number]  DATE:11/23/2006                            DOB:          11-28-1960    [name] [name] is seen for cardiology followup.  See my note of October 04, 2006.  She has an unexplained cardiomyopathy and we are titrating her  meds up.  Most recently, we put her on carvedilol and eventually  increased the dose up to 6.25 b.i.d.  She is feeling well.  Today, we  will start an ACE and follow her, and then increase her carvedilol  further.  I have re-explained why we are using these meds, and the  patient appears to understand and appreciate what we are doing.   PAST MEDICAL HISTORY:   ALLERGIES:  No known drug allergies.   MEDICATIONS:  1. Aspirin 325.  2. Synthroid 0.1.  3. Protonix 40.  4. Carvedilol 6.25 b.i.d.   OTHER MEDICAL PROBLEMS:  See the list below.   REVIEW OF SYSTEMS:  She is really feeling well and the review of systems  is negative.   PHYSICAL EXAM:  Weight is 199 pounds.  She does need to lose some  weight.  Blood pressure is 132/86 with a pulse of 74.  The patient is oriented to person, time, and place and her affect is  normal.  LUNGS:  Clear.  Respiratory effort is not labored.  There is no xanthelasma.  She has normal extraocular motion.  There are no carotid bruits.  There is no jugular venous distension.  CARDIAC:  Reveals an S1 with an S2.  There are no clicks or significant  murmurs.  ABDOMEN:  Soft.  There are no masses or bruits.  She has no significant peripheral edema.  There is no major  musculoskeletal deformity.   PROBLEMS:  1. Gastroesophageal reflux disease.  2. History of seasonal allergies.  3. Barrett's esophagus by history.  4. Hypothyroidism that is treated.  5. Mild LDL elevation.  6. Cardiomyopathy.  Ejection fraction 20-25% by echo.  We are      titrating her meds up and we  will start lisinopril 10 mg daily, and      then, after approximately a week, we will push her      carvedilol higher.  7. Status post left brain transient ischemic attack, which is not      related to a proven clot.     [name], MD, [facility]  Electronically Signed    JDK/MedQ  DD: 11/23/2006  DT: 11/23/2006  Job #: [id number]

## 2011-03-11 NOTE — Assessment & Plan Note (Signed)
[facility] HEALTHCARE                            CARDIOLOGY OFFICE NOTE   [name] [name], [name] [name]                         MRN:          [id number]  DATE:12/14/2006                            DOB:          Jan 25, 1961    [name] [name] is here for the followup of her cardiomyopathy.  We had been  adjusting her medications.  She has been tolerating her medications and  had been feeling relatively well.   Today she almost cancelled her visit because she appears to have  symptoms compatible with the flu.  We have been very careful with  assessing her and I believe she does have the flu.   She has aches and pains and headache and upper chest congestion.   PAST MEDICAL HISTORY:   ALLERGIES:  No known drug allergies.   MEDICATIONS:  1. Aspirin.  2. Synthroid.  3. Protonix.  4. Carvedilol 6.25 b.i.d.  5. Lotensin 10 daily.   OTHER MEDICAL PROBLEMS:  See the note of November 23, 2006.   REVIEW OF SYSTEMS:  As mentioned in the HPI she has symptoms compatible  with the flu.   PHYSICAL EXAMINATION:  Her physical is limited today.  VITAL SIGNS:  Her blood pressure is 129/86 with a pulse of 83.   PROBLEMS:  1. Gastroesophageal reflux disease.  2. History of seasonal allergies.  3. Barrett's esophagus by history.  4. Hypothyroidism, treated.  5. Mild elevation of LDL.  6. Cardiomyopathy.  We had started her Lotensin last visit and she is      stable with this.  We will not make a change today.  7. Status post left brain transient ischemic attack which was not      related to any type of proven clot.  8. Illness today that is compatible with the flu.  We are checking      with primary care to see the      recommendations on which antiviral medicine is appropriate to use.      I think it is indicated in her case.     [name], MD, [facility]  Electronically Signed    JDK/MedQ  DD: 12/14/2006  DT: 12/14/2006  Job #: [id number]

## 2011-03-11 NOTE — Discharge Summary (Signed)
[name] [name], [name] [name]                  ACCOUNT NO.:  [id number]   MEDICAL RECORD NO.:  [id number]          PATIENT TYPE:  INP   LOCATION:  3036                         FACILITY:  [facility]   PHYSICIAN:  Annie Main, N.P.      DATE OF BIRTH:  11/13/1960   DATE OF ADMISSION:  09/19/2006  DATE OF DISCHARGE:  09/21/2006                               DISCHARGE SUMMARY   DIAGNOSES AT TIME OF DISCHARGE:  1. Left brain transient ischemic attack.  2. Cardiomyopathy with ejection fraction of 20-25%, (new diagnosis      this admission).  3. Dyslipidemia.  4. Hypothyroidism.  5. Gastroesophageal reflux disease.  6. Barrett's esophagus.   MEDICINES AT TIME OF DISCHARGE:  1. Aspirin 325 mg a day.  2. Synthroid 0.1 mg a day.  3. Protonix 40 mg a day.  4. Lipitor 10 mg a day.   STUDIES PERFORMED:  1. CT of the brain on admission showed no acute abnormalities.  Mild      atrophy.  2. MRI of the brain showed no acute infarct.  Pansinus opacification.      Mild non-specific white-matter-type changes in the frontal lobes.  3. MRA of the head showed some intracranial medium-sized and small-      sized irregularity representing atherosclerotic-type disease.  4. Chest x-ray showed no acute finding.  5. Carotid Dopplers showed no ostial stenosis.  6. 2-D echocardiogram showed ejection fraction of 20-25% with left      ventricular systolic function severely reduced.  There was systolic      mitral blowing in anterior leaflet but no evidence of mitral valve      prolapse.  There was mild mitral valve regurgitation.  7. EKG showed decreased R waves of rows V1 to V3.   LABORATORY STUDIES:  CBC normal, chemistry normal, coagulation studies  normal.  Liver functions with alkaline phosphatase 35 and albumin 3.1,  otherwise normal.  Cardiac enzymes were negative.  Cholesterol 181, HDL  56, LDL 111, and triglycerides 68.  Urinalysis show 0-2 white blood  cells, 3-6 red blood cells, trace leukocyte, esterase of  few bacteria.  Her TSH was normal, homocystine was normal, urine pregnancy test was  negative, urine drug screen was negative, alcohol level was less than 5,  hemoglobin Alc was 5.7.   HISTORY OF PRESENT ILLNESS:  [name] [name] is a 50 year old right-  handed African-American female with a past medical history of  gastroesophageal reflux disease, Barrett's esophagus, and  hypothyroidism.  She awoke the morning of admission feeling normal.  She  was up ironing her clothes at approximately 5:30 when she experienced  sudden onset of right extremity weakness.  She said that she fell to the  ground and her right arm would not work.  Her family alerted EMS who  reported on the scene that she had slow speech and clumsy movements in  her right upper and lower extremities.  She was brought to [facility]  Emergency Room and a code stroke was called in route.  After she arrived  in the emergency room, she  was evaluated and her right side symptoms had  improved, although she continued to have disturbed speech.  At that time  she stated she had a small headache on the right side and some mild neck  pain.  She said she did have similar symptoms for this several years ago  and was seen at [facility] and discharged without deficit.  This  happened over 10 years ago and she was not sure why.  No diagnosis was  rendered at that time.  She will be admitted to the hospital for further  stroke evaluation.  She was not a TPA candidate secondary to NIH stroke  scale of 0 and quick resolution of symptoms.   HOSPITAL COURSE:  Stroke workup was completed and was unrevealing for an  acute stroke.  She was identified to have risk factors of dyslipidemia  with a mildly elevated LDL for which she was started on Lipitor and a  surprisingly low ejection fraction of 20-25%.  Patient has no cardiac  history prior to this point, and [facility] was consulted.  Dr.  Myrtis Ser saw her and arranged a transesophageal  echocardiogram.  The  transesophageal echocardiogram showed no embolic source.  He recommended  Statin, for which we are agreeable and started, and states he plans to  consider Coumadin in the future secondary to the event and depressed LV.  Recommendations were for a beta blocker, but as her blood pressure was  on the low side during hospitalization, 108/68, he was hesitant to start  secondary to neurologic process.  At the time of discharge, patient was  neurologically stable and it will be safe for her to start on an ACE or  beta blocker at followup with Dr. Myrtis Ser.  She has been advised to follow  up with him in 2 weeks.  She will follow up with Dr. Delia Heady in 2  months.  She was placed on aspirin for secondary stroke prevention.   CONDITION AT DISCHARGE:  Neurologically normal.   DISCHARGE PLAN:  1. Discharge home with family, no therapy needs.  2. Aspirin for stroke prevention.  3. New Lipitor for elevated cholesterol.  4. Follow up with Dr. Myrtis Ser in 2 weeks.  Okay from neurologic      standpoint to start ACE or a beta blocker.  5. Follow up with Dr. Pearlean Brownie in 2 months.      Annie Main, N.P.     SB/MEDQ  D:  09/22/2006  T:  09/22/2006  Job:  [id number]   cc:   Pramod P. Pearlean Brownie, MD  [name], MD, [facility] [facility], M.D.  Dr. Virginia Rochester

## 2011-03-11 NOTE — Consult Note (Signed)
NAMEZALEY, TALLEY                  ACCOUNT NO.:  [id number]   MEDICAL RECORD NO.:  [id number]          PATIENT TYPE:  INP   LOCATION:  3036                         FACILITY:  [facility]   PHYSICIAN:  [name], MD, FACCDATE OF BIRTH:  09/24/1961   DATE OF CONSULTATION:  09/20/2006  DATE OF DISCHARGE:                                 CONSULTATION   [name] [name] is admitted with a TIA. She is recovering. Two-D echocardiogram  was done to rule out cardiac source of embolus. No obvious clot was  seen. However, the patient has an unexpected cardiomyopathy with an  ejection fraction in the range of 20 to 25%. There is no prior history.  There is no prior cardiac work up that we are aware of. There is no  chest pain. There is no shortness of breath. EKG is not done yet. It is  pending.   ALLERGIES:  No known drug allergies.   MEDICATIONS:  The patient is on Protonix and levothyroxine. I do not  know her thyroid dose.   OTHER MEDICAL PROBLEMS:  See the complete list below.   SOCIAL HISTORY:  The patient does not smoke. She works as a Glass blower/designer. She  has continued to work recently, and her activity level has not been  limited.   FAMILY HISTORY:  There is a family history of coronary disease.   REVIEW OF SYSTEMS:  The patient has not been having any major headaches.  She has had no major eye problems. There are no skin issues. The patient  has not been having any difficulties with GI symptoms or GU symptoms.  She has had no major musculoskeletal pain. Until this admission, she had  no prior neurologic problems. She has had no chest pain or shortness of  breath. Otherwise, her review of systems is negative.   PHYSICAL EXAMINATION:  The patient is well developed and well nourished.  Her affect is normal. She seems oriented to person, time and place.  Blood pressure is 111/66 with a pulse of 67. Temperature is 98. The  patient has a mother and a brother in the room at the time of   evaluation.  Eye exam reveals no xanthelasma. The patient's tongue is normal. There  are no carotid bruits. There is no jugular venous distention. The  patient has no kyphosis or scoliosis.  Lungs are clear. Respiratory effort is not labored.  Cardiac exam reveals a S1 with a S2. There are no clicks or murmurs. I  do not hear a S3 at this time. The abdomen is soft. There are no masses  or bruits. There are normal bowel sounds. Her distal pulses appear to be  1+. She has no significant peripheral edema. There are no major  musculoskeletal deformities.   EKG is ordered at this time. Chest x-ray is also ordered at this time.  Head CT revealed no acute bleed. MRI and MRA mentioned the possibility  of one vessel with possibly some atherosclerotic change. I cannot  comment further on these results. Renal function is normal. TSH is  ordered at this time.  Two-D echocardiogram reveals global hypokinesis  with an ejection fraction in the 20 to 25% range. There is no major  valvular abnormality. HDL is 56 and LDL 111.   PROBLEMS INCLUDE:  1. History of hypothyroidism. TSH is needed at this time and is      ordered.  2. Transient ischemic attack. The patient presented with an acute      onset of some difficulty with her right arm and some speech      difficulty. This has cleared. She says this has happened in the      past with an evaluation in [place] greater than 10 years ago.  3. Ejection fraction 20 to 25% by current echocardiogram with no      obvious etiology. We do not know if this is ischemic or nonischemic      at this point. Further evaluation will be appropriate at the      certain time but not as of today.  4. History of a LDL of 111 with a HDL of 56.  5. History of gastroesophageal reflux disease.  6. Question of some hypertension.   PLAN:  Will be to obtain a TSH, EKG, and chest x-ray. Coumadin will have  to be considered in this patient who has depressed LV function and a  CNS  event. However, the thoughts about this will have to be coordinated with  the neurological workup. I will arrange for transesophageal  echocardiogram to help rule out any possibility of a PFO. Statin can be  considered at this point. For her LV function, she needs further workup  and treatment. At some time, cardiac catheterization can be considered,  but it is certainly not indicated at this point. The patient needs to be  a beta blocker and an ACE inhibitor with the doses adjusted. However,  her blood pressure today is in a low normal range. With her neurologic  event, I would be very hesitant to start any medicines that might lower  her blood pressure at this time. On this basis, I have not started an  ACE or a beta blocker as of today.   We will follow the patient with you. Thank you for the consult.      [name], MD, [facility]  Electronically Signed     JDK/MEDQ  D:  09/20/2006  T:  09/20/2006  Job:  [id number]

## 2011-03-11 NOTE — Op Note (Signed)
NAMECARLYON, [name] [name]                  ACCOUNT NO.:  [id number]   MEDICAL RECORD NO.:  [id number]          PATIENT TYPE:  AMB   LOCATION:  ENDO                         FACILITY:  [facility]   PHYSICIAN:  Georgiana Spinner, M.D.    DATE OF BIRTH:  08-18-1961   DATE OF PROCEDURE:  08/25/2006  DATE OF DISCHARGE:                                 OPERATIVE REPORT   PROCEDURE:  Upper endoscopy.   INDICATIONS:  Barrett's.   ANESTHESIA:  Demerol 50 mg and Versed 5 mg.   PROCEDURE:  With the patient mildly sedated in the left lateral decubitus  position, the Olympus videoscopic endoscope was inserted in the mouth and  passed under direct vision through the esophagus which appeared normal until  we reached distal esophagus.  There was a questionable area of Barrett's  photographed and biopsied.  We entered into the stomach.  Fundus, body,  antrum were visualized, antrum appeared somewhat mildly inflamed and this  was photographed and biopsied, duodenal bulb and second portion of duodenum  appeared normal.  From this point, the endoscope was slowly withdrawn taking  circumferential views of duodenal mucosa until the endoscope had been pulled  back into the stomach and placed in retroflexion to view the stomach from  below.  The endoscope was then straightened and withdrawn taking  circumferential views of the remaining gastric and esophageal mucosa.  The  patient's vital signs and pulse oximeter remained stable.  The patient  tolerated the procedure well without apparent complications.   FINDINGS:  Question of Barrett's esophagus biopsied and biopsy of the  antrum, await biopsy report.  The patient will call me for results and  follow-up with me as an outpatient.           ______________________________  Georgiana Spinner, M.D.     GMO/MEDQ  D:  08/25/2006  T:  08/25/2006  Job:  [id number]

## 2011-10-03 ENCOUNTER — Telehealth: Payer: Self-pay | Admitting: Internal Medicine

## 2011-10-03 ENCOUNTER — Encounter: Payer: BC Managed Care – PPO | Admitting: *Deleted

## 2011-10-03 NOTE — Telephone Encounter (Signed)
New problem:  C/O numbness in hands. Concerns with her defibrillator - spoken with Baxter Hire offer an appt today @ 3:15 pm pt unable to come - due to other oblations. Aware that Dr. Graciela Husbands nurse is not in the office today.

## 2011-10-03 NOTE — Telephone Encounter (Signed)
Pt calling to set up appt with Dr. Graciela Husbands and defib check. Transferred to scheduling.  Pt wants to be seen here instead of [place]? Mylo Red RN

## 2011-11-22 ENCOUNTER — Encounter: Payer: Self-pay | Admitting: Internal Medicine

## 2011-11-22 ENCOUNTER — Ambulatory Visit (INDEPENDENT_AMBULATORY_CARE_PROVIDER_SITE_OTHER): Payer: BC Managed Care – PPO | Admitting: Internal Medicine

## 2011-11-22 DIAGNOSIS — I428 Other cardiomyopathies: Secondary | ICD-10-CM

## 2011-11-22 DIAGNOSIS — I498 Other specified cardiac arrhythmias: Secondary | ICD-10-CM

## 2011-11-22 DIAGNOSIS — G459 Transient cerebral ischemic attack, unspecified: Secondary | ICD-10-CM

## 2011-11-22 DIAGNOSIS — R001 Bradycardia, unspecified: Secondary | ICD-10-CM

## 2011-11-22 DIAGNOSIS — Z95810 Presence of automatic (implantable) cardiac defibrillator: Secondary | ICD-10-CM

## 2011-11-22 LAB — ICD DEVICE OBSERVATION
AL AMPLITUDE: 1.1 mv
AL THRESHOLD: 0.2 V
ATRIAL PACING ICD: 1 pct
DEV-0020ICD: NEGATIVE
DEVICE MODEL ICD: 143696
RV LEAD AMPLITUDE: 17.4 mv
RV LEAD THRESHOLD: 0.8 V
TZAT-0001FASTVT: 1
TZAT-0001FASTVT: 2
TZAT-0001SLOWVT: 1
TZAT-0001SLOWVT: 2
TZAT-0002FASTVT: NEGATIVE
TZAT-0004FASTVT: 8
TZAT-0005SLOWVT: 81 pct
TZAT-0018FASTVT: NEGATIVE
TZAT-0018SLOWVT: NEGATIVE
TZAT-0019SLOWVT: 7.5 V
TZAT-0019SLOWVT: 7.5 V
TZAT-0020FASTVT: 1 ms
TZAT-0020SLOWVT: 1 ms
TZAT-0020SLOWVT: 1 ms
TZON-0003SLOWVT: 333 ms
TZON-0004FASTVT: 2.5
TZON-0005FASTVT: 1
TZON-0005SLOWVT: 1
TZST-0001FASTVT: 3
TZST-0001FASTVT: 5
TZST-0001FASTVT: 6
TZST-0001SLOWVT: 3
TZST-0001SLOWVT: 5
TZST-0001SLOWVT: 7
TZST-0003FASTVT: 31 J
TZST-0003FASTVT: 31 J
TZST-0003FASTVT: 31 J
TZST-0003SLOWVT: 14 J
TZST-0003SLOWVT: 31 J
TZST-0003SLOWVT: 31 J

## 2011-11-22 MED ORDER — CARVEDILOL 12.5 MG PO TABS: 12.5000 mg | ORAL_TABLET | Freq: Two times a day (BID) | ORAL | Status: DC

## 2011-11-22 NOTE — Progress Notes (Signed)
  HPI  [name] [name] is a 51 y.o. female seen in followup for her nonischemic cardiomyopathy and bradycardia status post ICD implantation for primary prevention.  The patient is seen for followup of cardiomyopathy. The cardiac viewpoint she is doing well. She is taking appropriate medications. She has only mild shortness of breath. No chest pain.     Past Medical History  Diagnosis Date  . Nonischemic cardiomyopathy     Etiology unknown, diagnosed 2008, patient has never had catheterization or nuclear study as of March, 2011  . Bradycardia   . GERD (gastroesophageal reflux disease)   . Hypothyroidism   . Dyslipidemia     LDL elevation,Patient does not want statin  . TIA (transient ischemic attack)     No CT or MRI abnormality, aspirin therapy  . Weight gain   . Leg swelling     right leg  . Pain     left leg & left knee  . Cough due to angiotensin-converting enzyme inhibitor     But patient chooses to continue ACE  . ICD (implantable cardiac defibrillator) battery depletion     Dr. Graciela Husbands, June, 2009(artifactual atrial tachycardia response events addressed by reprogramming in parentheses  . Dizziness     Dizziness with standing after squatting, March, 2012    Past Surgical History  Procedure Date  . Cardiac defibrillator placement     [facility] no remote    Current Outpatient Prescriptions  Medication Sig Dispense Refill  . aspirin 325 MG EC tablet Take 325 mg by mouth daily.        . benazepril (LOTENSIN) 20 MG tablet Take 5 mg by mouth daily.       . carvedilol (COREG) 12.5 MG tablet Take 1 tablet (12.5 mg total) by mouth 2 (two) times daily with a meal.      . fluticasone (FLONASE) 50 MCG/ACT nasal spray Place 2 sprays into the nose daily.      [name] levothyroxine (SYNTHROID, LEVOTHROID) 100 MCG tablet Take 100 mcg by mouth daily.        [name] loratadine (CLARITIN) 10 MG tablet Take 10 mg by mouth daily.      [name] OMEPRAZOLE PO Take by mouth daily.      . Vitamin D,  Ergocalciferol, (DRISDOL) 50000 UNITS CAPS Take 50,000 Units by mouth. Taking Twice a week      . zolpidem (AMBIEN) 10 MG tablet Take 10 mg by mouth at bedtime as needed.          No Known Allergies  Review of Systems negative except from HPI and PMH  Physical Exam BP 130/84  Pulse 63  Ht 5' 7.5" (1.715 m)  Wt 195 lb (88.451 kg)  BMI 30.09 kg/m2 Well developed and well nourished in no acute distress HENT normal E scleral and icterus clear Neck Supple JVP flat; carotids brisk and full Clear to ausculation Regular rate and rhythm, no murmurs gallops or rub Soft with active bowel sounds No clubbing cyanosis none Edema Alert and oriented, grossly normal motor and sensory function Skin Warm and Dry   Assessment and  Plan

## 2011-11-22 NOTE — Assessment & Plan Note (Signed)
The patient's device was interrogated.  The information was reviewed. No changes were made in the programming.    

## 2011-11-22 NOTE — Assessment & Plan Note (Signed)
Currently on aspirin presumably for this. Question decreased the dose to 162

## 2011-11-22 NOTE — Assessment & Plan Note (Signed)
Stable currently she is on beta blockers and ACE inhibitors. I wonder whether she is a candidate for aldosterone antagonism. I will defer this to Dr. Myrtis Ser

## 2011-11-22 NOTE — Patient Instructions (Signed)
Remote monitoring is used to monitor your Pacemaker of ICD from home. This monitoring reduces the number of office visits required to check your device to one time per year. It allows [place] to keep an eye on the functioning of your device to ensure it is working properly. You are scheduled for a device check from home on 02/23/12. You may send your transmission at any time that day. If you have a wireless device, the transmission will be sent automatically. After your physician reviews your transmission, you will receive a postcard with your next transmission date.  Your physician wants you to follow-up in: 1 year with Dr. Graciela Husbands. You will receive a reminder letter in the mail two months in advance. If you don't receive a letter, please call our office to schedule the follow-up appointment.  Your physician has recommended you make the following change in your medication:  1) Decrease aspirin to 81 mg two tablets by mouth once daily.

## 2012-02-11 ENCOUNTER — Other Ambulatory Visit (HOSPITAL_COMMUNITY): Payer: Self-pay | Admitting: Nurse Practitioner

## 2012-02-11 ENCOUNTER — Ambulatory Visit (HOSPITAL_COMMUNITY)
Admission: RE | Admit: 2012-02-11 | Discharge: 2012-02-11 | Disposition: A | Discharge status: Home / Self Care | Payer: BC Managed Care – PPO | Source: Ambulatory Visit | Attending: Nurse Practitioner | Admitting: Nurse Practitioner

## 2012-02-11 DIAGNOSIS — M7751 Other enthesopathy of right foot: Secondary | ICD-10-CM

## 2012-02-11 DIAGNOSIS — M659 Synovitis and tenosynovitis, unspecified: Secondary | ICD-10-CM | POA: Insufficient documentation

## 2012-02-11 DIAGNOSIS — M65979 Unspecified synovitis and tenosynovitis, unspecified ankle and foot: Secondary | ICD-10-CM | POA: Insufficient documentation

## 2012-02-23 ENCOUNTER — Encounter: Payer: Self-pay | Admitting: Internal Medicine

## 2012-02-23 ENCOUNTER — Ambulatory Visit (INDEPENDENT_AMBULATORY_CARE_PROVIDER_SITE_OTHER): Payer: BC Managed Care – PPO | Admitting: *Deleted

## 2012-02-23 DIAGNOSIS — I428 Other cardiomyopathies: Secondary | ICD-10-CM

## 2012-02-28 LAB — REMOTE ICD DEVICE
AL AMPLITUDE: 1.3 mv
AL IMPEDENCE ICD: 484 Ohm
CHARGE TIME: 12.8 s
DEV-0020ICD: NEGATIVE
HV IMPEDENCE: 41 Ohm
RV LEAD AMPLITUDE: 16.4 mv
TZAT-0001FASTVT: 1
TZAT-0001SLOWVT: 2
TZAT-0002FASTVT: NEGATIVE
TZAT-0013SLOWVT: 2
TZAT-0018FASTVT: NEGATIVE
TZAT-0018SLOWVT: NEGATIVE
TZON-0003FASTVT: 285.7 ms
TZON-0003SLOWVT: 333.3 ms
TZST-0001FASTVT: 3
TZST-0001FASTVT: 7
TZST-0001SLOWVT: 5
TZST-0001SLOWVT: 6
TZST-0001SLOWVT: 7
TZST-0003FASTVT: 31 J
TZST-0003FASTVT: 31 J
TZST-0003SLOWVT: 21 J
TZST-0003SLOWVT: 31 J

## 2012-03-02 NOTE — Progress Notes (Signed)
ICD remote 

## 2012-03-09 ENCOUNTER — Encounter: Payer: Self-pay | Admitting: *Deleted

## 2012-05-31 ENCOUNTER — Encounter: Payer: BC Managed Care – PPO | Admitting: *Deleted

## 2012-06-03 ENCOUNTER — Encounter: Payer: Self-pay | Admitting: *Deleted

## 2012-06-07 ENCOUNTER — Ambulatory Visit (INDEPENDENT_AMBULATORY_CARE_PROVIDER_SITE_OTHER): Payer: BC Managed Care – PPO | Admitting: *Deleted

## 2012-06-07 ENCOUNTER — Encounter: Payer: Self-pay | Admitting: Internal Medicine

## 2012-06-07 DIAGNOSIS — I428 Other cardiomyopathies: Secondary | ICD-10-CM

## 2012-06-08 LAB — REMOTE ICD DEVICE
AL AMPLITUDE: 1.5 mv
AL IMPEDENCE ICD: 484 Ohm
BATTERY VOLTAGE: 2.6 V
DEVICE MODEL ICD: 143696
FVT: 0
RV LEAD AMPLITUDE: 24.4 mv
RV LEAD IMPEDENCE ICD: 555 Ohm
TZAT-0001FASTVT: 1
TZAT-0001SLOWVT: 1
TZAT-0002FASTVT: NEGATIVE
TZAT-0013SLOWVT: 2
TZAT-0018FASTVT: NEGATIVE
TZAT-0018SLOWVT: NEGATIVE
TZON-0003FASTVT: 285.7 ms
TZST-0001FASTVT: 3
TZST-0001FASTVT: 4
TZST-0001FASTVT: 5
TZST-0001FASTVT: 6
TZST-0001SLOWVT: 3
TZST-0001SLOWVT: 4
TZST-0001SLOWVT: 7
TZST-0003FASTVT: 31 J
TZST-0003FASTVT: 31 J
TZST-0003FASTVT: 31 J
TZST-0003SLOWVT: 31 J
TZST-0003SLOWVT: 31 J
VENTRICULAR PACING ICD: 0 pct
VF: 0

## 2012-06-26 ENCOUNTER — Encounter: Payer: Self-pay | Admitting: *Deleted

## 2012-08-31 ENCOUNTER — Telehealth: Payer: Self-pay | Admitting: Internal Medicine

## 2012-08-31 NOTE — Telephone Encounter (Signed)
plz return call to patient [phone] regarding surgical clearance.   Pt needs clearance from both [name] [name] (completed) and [name] [name] (Still Waiting) as he as a device.

## 2012-09-04 NOTE — Telephone Encounter (Signed)
Pt scheduled to see Tereso Newcomer 09/07/2012.

## 2012-09-07 ENCOUNTER — Encounter: Payer: Self-pay | Admitting: Physician Assistant

## 2012-09-07 ENCOUNTER — Ambulatory Visit (INDEPENDENT_AMBULATORY_CARE_PROVIDER_SITE_OTHER): Payer: BC Managed Care – PPO | Admitting: Physician Assistant

## 2012-09-07 VITALS — BP 112/68 | HR 58 | Ht 67.5 in | Wt 197.0 lb

## 2012-09-07 DIAGNOSIS — I428 Other cardiomyopathies: Secondary | ICD-10-CM

## 2012-09-07 DIAGNOSIS — Z95810 Presence of automatic (implantable) cardiac defibrillator: Secondary | ICD-10-CM

## 2012-09-07 DIAGNOSIS — Z01810 Encounter for preprocedural cardiovascular examination: Secondary | ICD-10-CM

## 2012-09-07 NOTE — Progress Notes (Signed)
[address] [place], [place]  [address] Phone: [phone], Fax:  [phone]  Date:  09/07/2012   Name:  [name] [name]   DOB:  [birth year]   MRN:  [id number]  PCP:  Remus Loffler, PA  Primary Cardiologist:  Dr. Zackery Barefoot  Primary Electrophysiologist:  None    History of Present Illness: [name] [name] is a 51 y.o. female who returns for surgical clearance.  She has a hx of nonischemic cardiomyopathy with EF previously 20-25%, bradycardia, hypothyroidism, HL, prior TIA, status post ICD for primary prevention. Echo 12/08: EF 30-35%.  I have reviewed her chart.  I see no evidence of a stress test or cardiac catheterization done in the past.  She needs right radial tunnel release and right elbow arthroscopy with Dr. Eulah Pont.  She is fairly active.  She works at [employer].  She does a lot of heavy lifting in her job.  The patient denies chest pain, shortness of breath, syncope, orthopnea, PND or significant pedal edema. She is able to achieve > 4 METs without anginal symptoms.    Wt Readings from Last 3 Encounters:  09/07/12 197 lb (89.359 kg)  11/22/11 195 lb (88.451 kg)  01/18/11 200 lb (90.719 kg)     Past Medical History  Diagnosis Date  . Nonischemic cardiomyopathy     Etiology unknown, diagnosed 2008, patient has never had catheterization or nuclear study as of March, 2011  . Bradycardia   . GERD (gastroesophageal reflux disease)   . Hypothyroidism   . Dyslipidemia     LDL elevation,Patient does not want statin  . TIA (transient ischemic attack)     No CT or MRI abnormality, aspirin therapy  . Weight gain   . Leg swelling     right leg  . Pain     left leg & left knee  . Cough due to angiotensin-converting enzyme inhibitor     But patient chooses to continue ACE  . ICD (implantable cardiac defibrillator) battery depletion     Dr. Graciela Husbands, June, 2009(artifactual atrial tachycardia response events addressed by reprogramming in parentheses  . Dizziness       Dizziness with standing after squatting, March, 2012    Current Outpatient Prescriptions  Medication Sig Dispense Refill  . aspirin EC 81 MG tablet Take two tablets by mouth once daily.      . benazepril (LOTENSIN) 5 MG tablet Take 5 mg by mouth daily.      . carvedilol (COREG) 12.5 MG tablet Take 1 tablet (12.5 mg total) by mouth 2 (two) times daily with a meal.  60 tablet  11  . fluticasone (FLONASE) 50 MCG/ACT nasal spray Place 2 sprays into the nose daily.      [name] levothyroxine (SYNTHROID, LEVOTHROID) 100 MCG tablet Take 100 mcg by mouth daily.        [name] loratadine (CLARITIN) 10 MG tablet As needed      . OMEPRAZOLE PO Take by mouth daily.        Allergies: Allergies  Allergen Reactions  . Avelox (Moxifloxacin Hcl In Nacl)     Breathing problems  . Kiwi Extract     Makes pt. Feel like her throat is closing    Social History:  The patient  reports that she has never smoked. She has never used smokeless tobacco.   ROS:  Please see the history of present illness.   She has right elbow pain.  All other systems reviewed and negative.  PHYSICAL EXAM: VS:  BP 112/68  Pulse 58  Ht 5' 7.5" (1.715 m)  Wt 197 lb (89.359 kg)  BMI 30.40 kg/m2 Well nourished, well developed, in no acute distress HEENT: normal Neck: no JVD Cardiac:  normal S1, S2; RRR; no murmur Lungs:  clear to auscultation bilaterally, no wheezing, rhonchi or rales Abd: soft, nontender, no hepatomegaly Ext: no edema Skin: warm and dry Neuro:  CNs 2-12 intact, no focal abnormalities noted  EKG:  Sinus bradycardia, HR 58, normal axis, nonspecific ST-T wave changes      ASSESSMENT AND PLAN:  1. Nonischemic Cardiomyopathy:   Overall, the patient's volume status is stable. Continue current therapy. Followup with Dr. Myrtis Ser as planned.  2. Status Post AICD:   She will continue followup with Dr. Graciela Husbands as scheduled. She will have a remote interrogation of her device later this month.  3. Surgical Clearance:  The  patient does not have any unstable cardiac conditions.  She can achieve 4 METs or greater without anginal symptoms.  According to [facility] and AHA guidelines, she requires no further cardiac workup prior to her noncardiac surgery.  The patient should be at acceptable risk.  Our service is available as necessary in the perioperative period.  Her device rep will need to be available at the time of her surgery for help with her device.    Luna Glasgow, PA-C  11:27 AM 09/07/2012

## 2012-09-07 NOTE — Patient Instructions (Addendum)
FOLLOW UP AS PLANNED  NO CHANGES WERE MADE TODAY

## 2012-09-13 ENCOUNTER — Encounter: Payer: Self-pay | Admitting: Internal Medicine

## 2012-09-13 ENCOUNTER — Ambulatory Visit (INDEPENDENT_AMBULATORY_CARE_PROVIDER_SITE_OTHER): Payer: BC Managed Care – PPO | Admitting: *Deleted

## 2012-09-13 DIAGNOSIS — I428 Other cardiomyopathies: Secondary | ICD-10-CM

## 2012-09-13 DIAGNOSIS — Z95810 Presence of automatic (implantable) cardiac defibrillator: Secondary | ICD-10-CM

## 2012-09-21 LAB — REMOTE ICD DEVICE
ATRIAL PACING ICD: 0 pct
CHARGE TIME: 12.5 s
DEV-0020ICD: NEGATIVE
MODE SWITCH EPISODES: 1
PACEART VT: 0
TOT-0006: 20130815000000
TZAT-0001FASTVT: 2
TZAT-0001SLOWVT: 2
TZAT-0013FASTVT: 1
TZAT-0013SLOWVT: 2
TZAT-0018FASTVT: NEGATIVE
TZON-0003SLOWVT: 333.3 ms
TZST-0001FASTVT: 6
TZST-0001FASTVT: 7
TZST-0001SLOWVT: 4
TZST-0001SLOWVT: 5
TZST-0001SLOWVT: 6
TZST-0003FASTVT: 21 J
TZST-0003FASTVT: 31 J
TZST-0003SLOWVT: 14 J
TZST-0003SLOWVT: 21 J
TZST-0003SLOWVT: 31 J
VENTRICULAR PACING ICD: 0 pct

## 2012-10-01 ENCOUNTER — Encounter: Payer: Self-pay | Admitting: *Deleted

## 2012-10-10 ENCOUNTER — Encounter (HOSPITAL_BASED_OUTPATIENT_CLINIC_OR_DEPARTMENT_OTHER): Payer: Self-pay | Admitting: *Deleted

## 2012-10-10 NOTE — Progress Notes (Signed)
Pt will need istat- Dr Graciela Husbands sent icd form-dr Myrtis Ser notes state rep needs to be here-bryan small called-will be here for surgery

## 2012-10-10 NOTE — H&P (Signed)
[address]   [address] [place], [place] [address] [phone] A Division of [facility]  Loreta Ave, M.D.   Robert A. Thurston Hole, M.D.   Burnell Blanks, M.D.   Eulas Post, M.D.   Lunette Stands, M.D Buford Dresser, M.D.  Charlsie Quest, M.D.   Estell Harpin, M.D.   Melina Fiddler, M.D. Genene Churn. Barry Dienes, PA-C            Kirstin A. Shepperson, PA-C Josh [place], PA-C [place], [place]   RE: [name], [name]                                [phone]      DOB: 1960-12-28 INITIAL EVALUATION:  06-26-12 Chief complaint: Right elbow pain.  History of present illness: 14 two year-old black female who is a new patient to the office and presents with the above complaint.  Right elbow pain ongoing since March of 2013.  Denies injury.  Pain localized to the lateral aspect and antecubital region.  She cannot recall any specific injury.  Symptoms aggravated when she is lifting and gripping objects.  She was seen by a physician assistant at her job and was told that she likely had tennis elbow and was referred to Dr. Randa Evens, orthopedic surgeon in [place].  She said that Dr. Randa Evens did not think that she had tennis elbow, but patient encouraged him to do a tennis elbow injection anyway.  After the injection she states that she had great relief for about a month, but then symptoms returned.  Intermittent numbness and tingling in her right hand.  No complaints of neck pain.   Current medications: Omeprazole, Lortab, Synthroid, Flexeril, Loratadine, Benazepril, Carvedilol and Vitamin D. Allergies: Avelox. Past medical/surgical history: GERD, hypertension, implanted defibrillator, tubal ligation and hypothyroidism. Review of systems: All other systems are unremarkable.   Family history: Positive for hypertension and heart disease.  Social history: Does not smoke or drink.  Patient is a widow and employed with [employer].    EXAMINATION: Height: 5?7.  Weight: 194  pounds.  Blood pressure: 157/100.  Pleasant black female, alert and oriented x 3 and in no acute distress.  Gait is normal.  No increase in respiratory effort.  Cervical spine unremarkable.  Bilateral shoulders good range of motion.  Negative impingement test.  Right elbow good range of motion.  No swelling or bruising.  She is moderately tender over the lateral epicondyle, radial tunnel and over the distal biceps tendon.  No tendon defect.  Negative Tinel's cubital tunnel.  Neurovascularly intact.  Skin warm and dry.      X-RAYS: Right elbow, AP and lateral views, reviewed from February 11, 2012 taken at [facility] and was read by the radiologist as a negative exam.  After reviewing the actual films, on the AP there are a few spurs just around the lateral epicondyle.  Joint space looks good.    IMPRESSION: Chronic right elbow pain, question secondary to lateral epicondylitis and possible radial tunnel syndrome.  Question distal biceps tendonitis.   PLAN: Since patient cannot have an MRI scan due to her implanted defibrillator, we elected to treat conservatively with one more injection.  She states that she has used a tennis elbow strap in the past which did not help her symptoms, so we will hold off on using this.  Follow up in a few weeks for recheck  to see how she is feeling.  Advised to pay close attention to how she feels while her elbow is numb.  All questions answered.    Continued  [facility] [address]   [address] [place], [place] [address] [phone] A Division of [facility]  Loreta Ave, M.D.   Robert A. Thurston Hole, M.D.   Burnell Blanks, M.D.   Eulas Post, M.D.   Lunette Stands, M.D Buford Dresser, M.D.  Charlsie Quest, M.D.   Estell Harpin, M.D.   Melina Fiddler, M.D. Genene Churn. Barry Dienes, PA-C            Kirstin A. Shepperson, PA-C Josh [place], PA-C [place], [place]   RE: [name], [name]                                 [phone]      DOB: 1961/06/10 PROGRESS NOTE: 06-26-12 PROCEDURE NOTE: The patient's clinical condition is marked by substantial pain and/or significant functional disability.  Other conservative therapy has not provided relief, is contraindicated, or not appropriate.  There is a reasonable likelihood that injection will significantly improve the patient's pain and/or functional disability. After patient consent the right lateral elbow was prepped with Betadine and then tennis elbow 1:2 Depo-Medrol/Marcaine injection performed.  Tolerated the procedure well without complication.   Loreta Ave, M.D.   Electronically verified by Loreta Ave, M.D. DFM(JMO):jjh D 06-28-12 T 06-29-12 [name] [name]/[facility] [address]   [address] [place], [place] [address] [phone] A Division of [facility]  Loreta Ave, M.D.   Robert A. Thurston Hole, M.D.   Burnell Blanks, M.D.   Eulas Post, M.D.   Lunette Stands, M.D Buford Dresser, M.D.  Charlsie Quest, M.D.   Estell Harpin, M.D.   Melina Fiddler, M.D. Genene Churn. Barry Dienes, PA-C            Kirstin A. Shepperson, PA-C Josh [place], PA-C [place], [place]  RE: [name], [name]   [id number]      DOB: [birth year] PROGRESS NOTE: 08-07-12 [name] [name] comes in for follow-up. The cortisone injection did not help much even with Marcaine in place. She did note that once the Marcaine wore off she was much worse with rebound pain. Symptoms getting worse rather than better. Findings are consistent with lateral epicondylitis and radial tunnel. I think this is why the shot was not as dramatic.  She's had symptoms since March. She does repetitive motion and there's no question that the nature of her work is playing a large part of her current diagnosis and continued worsening symptoms. We have not gotten an MRI due to an implanted defibrillator. Previous x-rays showed a little spurring  calcification laterally otherwise the joint looked good. She comes in to discuss definitive treatment. Remaining history and general exam is outlined and included in the chart.   EXAMINATION: Exquisite tenderness over the lateral epicondyle and radial tunnel. No medial epicondylitis. Her biceps tendon distally is a little sore but not dramatic. She is neurovascularly intact distally. A little weakness with extension of her index finger.  PROCEDURE: After appropriate consent and discussion ultrasound evaluation was performed at the right lateral elbow. There is significant tearing of the ECRB tendon with some evidence of calcification in that area. Distal biceps intact. I can't tell if there is a complete tear  of the lateral extensors but at least the deep tendon is torn.  DISPOSITION: Steadily unrelenting symptoms that need to be addressed. I think she has a combination of lateral epicondylitis and radial tunnel. We talked about EMG nerve conduction studies but I don't think they are necessary. If they are positive I would release her radial nerve but if they are negative I think she has enough clinically to warrant doing that when I operate on the lateral elbow. In that regard I'm not going to make her go through EMG nerve conduction studies. Discussed definitive treatment. Lateral approach with debridement repair lateral epicondyle. Rehab, recovery and amount of time out of work is going to depend on what I find and whether or not I have to do an anchor repair or just a debridement. At the same setting I'm going to do a radial tunnel decompression. Based on her job she'll be out of work at least 12 weeks. I would also reinforce that the nature of her work is playing a part of diagnosis symptoms and need for treatment. All questions answered.   Loreta Ave, M.D.  Electronically verified by Loreta Ave, M.D. DFM:kh D 08-07-12 T 08-08-12

## 2012-10-11 ENCOUNTER — Ambulatory Visit (HOSPITAL_BASED_OUTPATIENT_CLINIC_OR_DEPARTMENT_OTHER): Payer: BC Managed Care – PPO | Admitting: Anesthesiology

## 2012-10-11 ENCOUNTER — Ambulatory Visit (HOSPITAL_BASED_OUTPATIENT_CLINIC_OR_DEPARTMENT_OTHER)
Admission: RE | Admit: 2012-10-11 | Discharge: 2012-10-11 | Disposition: A | Discharge status: Home / Self Care | Payer: BC Managed Care – PPO | Source: Ambulatory Visit | Attending: Orthopedic Surgery | Admitting: Orthopedic Surgery

## 2012-10-11 ENCOUNTER — Encounter (HOSPITAL_BASED_OUTPATIENT_CLINIC_OR_DEPARTMENT_OTHER): Payer: Self-pay | Admitting: Anesthesiology

## 2012-10-11 ENCOUNTER — Encounter (HOSPITAL_BASED_OUTPATIENT_CLINIC_OR_DEPARTMENT_OTHER)
Admission: RE | Disposition: A | Discharge status: Home / Self Care | Payer: Self-pay | Source: Ambulatory Visit | Attending: Orthopedic Surgery

## 2012-10-11 ENCOUNTER — Encounter (HOSPITAL_BASED_OUTPATIENT_CLINIC_OR_DEPARTMENT_OTHER): Payer: Self-pay

## 2012-10-11 DIAGNOSIS — K219 Gastro-esophageal reflux disease without esophagitis: Secondary | ICD-10-CM | POA: Insufficient documentation

## 2012-10-11 DIAGNOSIS — M5412 Radiculopathy, cervical region: Secondary | ICD-10-CM | POA: Insufficient documentation

## 2012-10-11 DIAGNOSIS — M66239 Spontaneous rupture of extensor tendons, unspecified forearm: Secondary | ICD-10-CM | POA: Insufficient documentation

## 2012-10-11 DIAGNOSIS — M66249 Spontaneous rupture of extensor tendons, unspecified hand: Secondary | ICD-10-CM | POA: Insufficient documentation

## 2012-10-11 DIAGNOSIS — Z95810 Presence of automatic (implantable) cardiac defibrillator: Secondary | ICD-10-CM | POA: Insufficient documentation

## 2012-10-11 DIAGNOSIS — I499 Cardiac arrhythmia, unspecified: Secondary | ICD-10-CM | POA: Insufficient documentation

## 2012-10-11 DIAGNOSIS — M7710 Lateral epicondylitis, unspecified elbow: Secondary | ICD-10-CM | POA: Insufficient documentation

## 2012-10-11 DIAGNOSIS — E039 Hypothyroidism, unspecified: Secondary | ICD-10-CM | POA: Insufficient documentation

## 2012-10-11 HISTORY — PX: LATERAL EPICONDYLE RELEASE: SHX1958

## 2012-10-11 HISTORY — DX: Unspecified osteoarthritis, unspecified site: M19.90

## 2012-10-11 LAB — POCT I-STAT, CHEM 8
Chloride: 109 mEq/L (ref 96–112)
Glucose, Bld: 88 mg/dL (ref 70–99)
HCT: 35 % — ABNORMAL LOW (ref 36.0–46.0)
Potassium: 4.2 mEq/L (ref 3.5–5.1)

## 2012-10-11 SURGERY — TENNIS ELBOW RELEASE/NIRSCHEL PROCEDURE
Anesthesia: General | Site: Elbow | Laterality: Right | Wound class: Clean

## 2012-10-11 MED ORDER — MIDAZOLAM HCL 5 MG/5ML IJ SOLN
INTRAMUSCULAR | Status: DC | PRN
  Administered 2012-10-11: 1 mg via INTRAVENOUS

## 2012-10-11 MED ORDER — ONDANSETRON HCL 4 MG/2ML IJ SOLN
INTRAMUSCULAR | Status: DC | PRN
  Administered 2012-10-11: 4 mg via INTRAVENOUS

## 2012-10-11 MED ORDER — ACETAMINOPHEN 10 MG/ML IV SOLN
1000.0000 mg | Freq: Once | INTRAVENOUS | Status: AC
  Administered 2012-10-11: 1000 mg via INTRAVENOUS

## 2012-10-11 MED ORDER — PROPOFOL 10 MG/ML IV BOLUS
INTRAVENOUS | Status: DC | PRN
  Administered 2012-10-11: 150 mg via INTRAVENOUS

## 2012-10-11 MED ORDER — OXYCODONE HCL 5 MG PO TABS: 5.0000 mg | ORAL_TABLET | Freq: Once | ORAL | Status: DC | PRN

## 2012-10-11 MED ORDER — LACTATED RINGERS IV SOLN
INTRAVENOUS | Status: DC
  Administered 2012-10-11 (×2): via INTRAVENOUS

## 2012-10-11 MED ORDER — FENTANYL CITRATE 0.05 MG/ML IJ SOLN
INTRAMUSCULAR | Status: DC | PRN
  Administered 2012-10-11: 50 ug via INTRAVENOUS
  Administered 2012-10-11: 100 ug via INTRAVENOUS

## 2012-10-11 MED ORDER — LIDOCAINE HCL (CARDIAC) 20 MG/ML IV SOLN
INTRAVENOUS | Status: DC | PRN
  Administered 2012-10-11: 50 mg via INTRAVENOUS

## 2012-10-11 MED ORDER — KETOROLAC TROMETHAMINE 30 MG/ML IJ SOLN
30.0000 mg | Freq: Four times a day (QID) | INTRAMUSCULAR | Status: DC | PRN
  Administered 2012-10-11: 30 mg via INTRAVENOUS

## 2012-10-11 MED ORDER — OXYCODONE-ACETAMINOPHEN 5-325 MG PO TABS
1.0000 | ORAL_TABLET | ORAL | Status: DC | PRN
Start: 2012-10-11 — End: 2013-05-22

## 2012-10-11 MED ORDER — BUPIVACAINE HCL (PF) 0.25 % IJ SOLN
INTRAMUSCULAR | Status: DC | PRN
  Administered 2012-10-11: 10 mL

## 2012-10-11 MED ORDER — OXYCODONE HCL 5 MG/5ML PO SOLN: 5.0000 mg | Freq: Once | ORAL | Status: DC | PRN

## 2012-10-11 MED ORDER — HYDROMORPHONE HCL PF 1 MG/ML IJ SOLN
0.2500 mg | INTRAMUSCULAR | Status: DC | PRN
  Administered 2012-10-11: 0.5 mg via INTRAVENOUS
  Administered 2012-10-11 (×2): 0.25 mg via INTRAVENOUS
  Administered 2012-10-11: 0.5 mg via INTRAVENOUS

## 2012-10-11 MED ORDER — CEFAZOLIN SODIUM-DEXTROSE 2-3 GM-% IV SOLR
2.0000 g | INTRAVENOUS | Status: AC
  Administered 2012-10-11: 2 g via INTRAVENOUS

## 2012-10-11 MED ORDER — DEXAMETHASONE SODIUM PHOSPHATE 4 MG/ML IJ SOLN
INTRAMUSCULAR | Status: DC | PRN
  Administered 2012-10-11: 8 mg via INTRAVENOUS

## 2012-10-11 SURGICAL SUPPLY — 73 items
BANDAGE ELASTIC 3 VELCRO ST LF (GAUZE/BANDAGES/DRESSINGS) ×3 IMPLANT
BANDAGE ELASTIC 4 VELCRO ST LF (GAUZE/BANDAGES/DRESSINGS) ×6 IMPLANT
BENZOIN TINCTURE PRP APPL 2/3 (GAUZE/BANDAGES/DRESSINGS) ×3 IMPLANT
BLADE SURG 15 STRL LF DISP TIS (BLADE) ×4 IMPLANT
BLADE SURG 15 STRL SS (BLADE) ×2
BNDG CMPR 9X4 STRL LF SNTH (GAUZE/BANDAGES/DRESSINGS) ×1
BNDG COHESIVE 3X5 TAN STRL LF (GAUZE/BANDAGES/DRESSINGS) ×3 IMPLANT
BNDG COHESIVE 4X5 TAN STRL (GAUZE/BANDAGES/DRESSINGS) ×3 IMPLANT
BNDG ESMARK 4X9 LF (GAUZE/BANDAGES/DRESSINGS) ×3 IMPLANT
CANISTER SUCTION 1200CC (MISCELLANEOUS) ×3 IMPLANT
CLOTH BEACON ORANGE TIMEOUT ST (SAFETY) ×3 IMPLANT
CORDS BIPOLAR (ELECTRODE) ×3 IMPLANT
COVER MAYO STAND STRL (DRAPES) ×3 IMPLANT
COVER TABLE BACK 60X90 (DRAPES) ×3 IMPLANT
CUFF TOURNIQUET SINGLE 18IN (TOURNIQUET CUFF) ×3 IMPLANT
DECANTER SPIKE VIAL GLASS SM (MISCELLANEOUS) IMPLANT
DRAPE EXTREMITY T 121X128X90 (DRAPE) ×3 IMPLANT
DRAPE SURG 17X23 STRL (DRAPES) ×3 IMPLANT
DRAPE U 20/CS (DRAPES) ×3 IMPLANT
DRAPE U-SHAPE 47X51 STRL (DRAPES) ×3 IMPLANT
DRSG PAD ABDOMINAL 8X10 ST (GAUZE/BANDAGES/DRESSINGS) ×3 IMPLANT
DURAPREP 26ML APPLICATOR (WOUND CARE) ×3 IMPLANT
ELECT REM PT RETURN 9FT ADLT (ELECTROSURGICAL) ×3
ELECTRODE REM PT RTRN 9FT ADLT (ELECTROSURGICAL) ×2 IMPLANT
GAUZE XEROFORM 1X8 LF (GAUZE/BANDAGES/DRESSINGS) ×3 IMPLANT
GLOVE BIO SURGEON STRL SZ 6.5 (GLOVE) ×3 IMPLANT
GLOVE BIOGEL PI IND STRL 7.0 (GLOVE) ×2 IMPLANT
GLOVE BIOGEL PI IND STRL 8 (GLOVE) ×2 IMPLANT
GLOVE BIOGEL PI INDICATOR 7.0 (GLOVE) ×1
GLOVE BIOGEL PI INDICATOR 8 (GLOVE) ×1
GLOVE ORTHO TXT STRL SZ7.5 (GLOVE) ×6 IMPLANT
GOWN BRE IMP PREV XXLGXLNG (GOWN DISPOSABLE) ×3 IMPLANT
GOWN PREVENTION PLUS XLARGE (GOWN DISPOSABLE) ×6 IMPLANT
NEEDLE 1/2 CIR CATGUT .05X1.09 (NEEDLE) IMPLANT
NEEDLE HYPO 25X1 1.5 SAFETY (NEEDLE) ×3 IMPLANT
NS IRRIG 1000ML POUR BTL (IV SOLUTION) ×3 IMPLANT
PACK BASIN DAY SURGERY FS (CUSTOM PROCEDURE TRAY) ×3 IMPLANT
PAD CAST 3X4 CTTN HI CHSV (CAST SUPPLIES) ×4 IMPLANT
PAD CAST 4YDX4 CTTN HI CHSV (CAST SUPPLIES) ×4 IMPLANT
PADDING CAST ABS 3INX4YD NS (CAST SUPPLIES)
PADDING CAST ABS 4INX4YD NS (CAST SUPPLIES)
PADDING CAST ABS COTTON 3X4 (CAST SUPPLIES) IMPLANT
PADDING CAST ABS COTTON 4X4 ST (CAST SUPPLIES) IMPLANT
PADDING CAST COTTON 3X4 STRL (CAST SUPPLIES) ×2
PADDING CAST COTTON 4X4 STRL (CAST SUPPLIES) ×2
PENCIL BUTTON HOLSTER BLD 10FT (ELECTRODE) IMPLANT
SLEEVE SCD COMPRESS KNEE MED (MISCELLANEOUS) ×3 IMPLANT
SLING ARM FOAM STRAP LRG (SOFTGOODS) IMPLANT
SLING ARM FOAM STRAP MED (SOFTGOODS) IMPLANT
SPLINT FIBERGLASS 3X35 (CAST SUPPLIES) ×3 IMPLANT
SPLINT PLASTER CAST XFAST 3X15 (CAST SUPPLIES) IMPLANT
SPLINT PLASTER XTRA FASTSET 3X (CAST SUPPLIES)
SPONGE GAUZE 4X4 12PLY (GAUZE/BANDAGES/DRESSINGS) ×3 IMPLANT
STOCKINETTE 4X48 STRL (DRAPES) ×3 IMPLANT
STRIP CLOSURE SKIN 1/2X4 (GAUZE/BANDAGES/DRESSINGS) ×3 IMPLANT
SUCTION FRAZIER TIP 10 FR DISP (SUCTIONS) ×3 IMPLANT
SUT ETHILON 3 0 PS 1 (SUTURE) ×3 IMPLANT
SUT FIBERWIRE #2 38 T-5 BLUE (SUTURE) ×3
SUT VIC AB 0 CT1 27 (SUTURE)
SUT VIC AB 0 CT1 27XBRD ANBCTR (SUTURE) IMPLANT
SUT VIC AB 2-0 SH 27 (SUTURE)
SUT VIC AB 2-0 SH 27XBRD (SUTURE) IMPLANT
SUT VIC AB 3-0 SH 18 (SUTURE) ×6 IMPLANT
SUT VIC AB 3-0 SH 27 (SUTURE) ×1
SUT VIC AB 3-0 SH 27X BRD (SUTURE) ×2 IMPLANT
SUTURE FIBERWR #2 38 T-5 BLUE (SUTURE) ×2 IMPLANT
SYR BULB 3OZ (MISCELLANEOUS) ×3 IMPLANT
SYR CONTROL 10ML LL (SYRINGE) ×3 IMPLANT
TOWEL OR 17X24 6PK STRL BLUE (TOWEL DISPOSABLE) ×3 IMPLANT
TOWEL OR NON WOVEN STRL DISP B (DISPOSABLE) ×3 IMPLANT
TUBE CONNECTING 20X1/4 (TUBING) ×3 IMPLANT
UNDERPAD 30X30 INCONTINENT (UNDERPADS AND DIAPERS) ×3 IMPLANT
WATER STERILE IRR 1000ML POUR (IV SOLUTION) ×3 IMPLANT

## 2012-10-11 NOTE — Anesthesia Preprocedure Evaluation (Signed)
Anesthesia Evaluation  Patient identified by MRN, date of birth, ID band Patient awake    Reviewed: Allergy & Precautions, H&P , NPO status , Patient's Chart, lab work & pertinent test results  Airway Mallampati: II TM Distance: >3 FB Neck ROM: Full    Dental No notable dental hx. (+) Teeth Intact and Dental Advisory Given   Pulmonary neg pulmonary ROS,  breath sounds clear to auscultation  Pulmonary exam normal       Cardiovascular negative cardio ROS  + dysrhythmias Rhythm:Regular Rate:Normal     Neuro/Psych TIAnegative psych ROS   GI/Hepatic Neg liver ROS, GERD-  Medicated and Controlled,  Endo/Other  Hypothyroidism   Renal/GU negative Renal ROS  negative genitourinary   Musculoskeletal   Abdominal   Peds  Hematology negative hematology ROS (+)   Anesthesia Other Findings   Reproductive/Obstetrics negative OB ROS                           Anesthesia Physical Anesthesia Plan  ASA: III  Anesthesia Plan: General   Post-op Pain Management:    Induction: Intravenous  Airway Management Planned: LMA  Additional Equipment:   Intra-op Plan:   Post-operative Plan: Extubation in OR  Informed Consent: I have reviewed the patients History and Physical, chart, labs and discussed the procedure including the risks, benefits and alternatives for the proposed anesthesia with the patient or authorized representative who has indicated his/her understanding and acceptance.   Dental advisory given  Plan Discussed with: CRNA  Anesthesia Plan Comments:         Anesthesia Quick Evaluation

## 2012-10-11 NOTE — Anesthesia Procedure Notes (Signed)
Procedure Name: LMA Insertion Performed by: Hadja Harral, [place] Pre-anesthesia Checklist: Patient identified, Timeout performed, Emergency Drugs available, Suction available and Patient being monitored Patient Re-evaluated:Patient Re-evaluated prior to inductionOxygen Delivery Method: Circle system utilized Preoxygenation: Pre-oxygenation with 100% oxygen Intubation Type: IV induction Ventilation: Mask ventilation without difficulty LMA: LMA inserted LMA Size: 4.0 Number of attempts: 1 Placement Confirmation: breath sounds checked- equal and bilateral and positive ETCO2 Dental Injury: Teeth and Oropharynx as per pre-operative assessment      

## 2012-10-11 NOTE — Anesthesia Postprocedure Evaluation (Signed)
  Anesthesia Post-op Note  Patient: [name] [name]  Procedure(s) Performed: Procedure(s) (LRB) with comments: TENNIS ELBOW RELEASE (Right) - RIGHT ELBOW: TENOTOMY ELBOW LATERAL EPICONDYLITIS TENNIS ELBOW: RADIAL TUNNEL RELEASE  Patient Location: PACU  Anesthesia Type:General  Level of Consciousness: awake  Airway and Oxygen Therapy: Patient Spontanous Breathing and Patient connected to face mask oxygen  Post-op Pain: mild  Post-op Assessment: Post-op Vital signs reviewed, Patient's Cardiovascular Status Stable, Respiratory Function Stable, Patent Airway and No signs of Nausea or vomiting  Post-op Vital Signs: Reviewed and stable  Complications: No apparent anesthesia complications

## 2012-10-11 NOTE — Interval H&P Note (Signed)
History and Physical Interval Note:  10/11/2012 7:28 AM  [name] [name]  has presented today for surgery, with the diagnosis of RIGHT ELBOW EPICONDYLITIS - LATERAL TENNIS ELBOW, RIGHT WRIST RADIAL TUNNEL SYNDROME  The various methods of treatment have been discussed with the patient and family. After consideration of risks, benefits and other options for treatment, the patient has consented to  Procedure(s) (LRB) with comments: TENNIS ELBOW RELEASE (Right) - RIGHT ELBOW: TENOTOMY ELBOW LATERAL EPICONDYLITIS TENNIS ELBOW, [place], MEDIAL HUMERAL EPICONYLE, RIGHT WRIST: RADIAL TUNNEL RELEASE CARPAL TUNNEL RELEASE (Right) as a surgical intervention .  The patient's history has been reviewed, patient examined, no change in status, stable for surgery.  I have reviewed the patient's chart and labs.  Questions were answered to the patient's satisfaction.     Minyon Billiter F

## 2012-10-11 NOTE — Transfer of Care (Signed)
Immediate Anesthesia Transfer of Care Note  Patient: [name] [name]  Procedure(s) Performed: Procedure(s) (LRB) with comments: TENNIS ELBOW RELEASE (Right) - RIGHT ELBOW: TENOTOMY ELBOW LATERAL EPICONDYLITIS TENNIS ELBOW: RADIAL TUNNEL RELEASE  Patient Location: PACU  Anesthesia Type:General  Level of Consciousness: sedated  Airway & Oxygen Therapy: Patient Spontanous Breathing and Patient connected to face mask oxygen  Post-op Assessment: Report given to PACU RN and Post -op Vital signs reviewed and stable  Post vital signs: Reviewed and stable  Complications: No apparent anesthesia complications

## 2012-10-11 NOTE — Brief Op Note (Signed)
10/11/2012  2:15 PM  PATIENT:  [name] [name]  51 y.o. female  PRE-OPERATIVE DIAGNOSIS:  RIGHT ELBOW EPICONDYLITIS - LATERAL TENNIS ELBOW, RIGHT WRIST RADIAL TUNNEL SYNDROME  POST-OPERATIVE DIAGNOSIS:  RIGHT ELBOW EPICONDYLITIS - LATERAL TENNIS ELBOW, radial tunnel syndrome  PROCEDURE:  Procedure(s) (LRB) with comments: TENNIS ELBOW RELEASE (Right) - RIGHT ELBOW: TENOTOMY ELBOW LATERAL EPICONDYLITIS TENNIS ELBOW: RADIAL TUNNEL RELEASE  SURGEON:  Surgeon(s) and Role:    * Loreta Ave, MD - Primary  PHYSICIAN ASSISTANT: Zonia Kief M   ANESTHESIA:   general  EBL:  Total I/O In: 4098 [P.O.:222; I.V.:1300] Out: -    SPECIMEN:  No Specimen  DISPOSITION OF SPECIMEN:  N/A  COUNTS:  YES  TOURNIQUET:   Total Tourniquet Time Documented: Upper Arm (Right) - 52 minutes   PATIENT DISPOSITION:  PACU - hemodynamically stable.

## 2012-10-12 ENCOUNTER — Encounter (HOSPITAL_BASED_OUTPATIENT_CLINIC_OR_DEPARTMENT_OTHER): Payer: Self-pay | Admitting: Orthopedic Surgery

## 2012-10-12 NOTE — Op Note (Signed)
NAMECHRISTAIN, [name] [name] NO.:  [id number]  MEDICAL RECORD NO.:  [id number]  LOCATION:                                 FACILITY:  PHYSICIAN:  Loreta Ave, M.D. DATE OF BIRTH:  December 26, 1960  DATE OF PROCEDURE:  10/11/2012 DATE OF DISCHARGE:                              OPERATIVE REPORT   PREOPERATIVE DIAGNOSES: 1. Right elbow lateral epicondylitis with tearing extensor carpi     radialis brevis tendon. 2. Radial tunnel syndrome, right proximal forearm.  POSTOPERATIVE DIAGNOSES: 1. Right elbow lateral epicondylitis with tearing extensor carpi     radialis brevis tendon. 2. Radial tunnel syndrome, right proximal forearm.  PROCEDURES: 1. Right elbow lateral exploration.  Debridement of ECRB tendon and     lateral epicondyle with drilling.  Repair of superficial extensors     over defect. 2. Radial tunnel decompression of right proximal forearm.  SURGEON:  Loreta Ave, M.D.  ASSISTANT:  Genene Churn. Barry Dienes, [place], present throughout the entire case, necessary for timely completion of procedure.  ANESTHESIA:  General.  BLOOD LOSS:  Minimal.  SPECIMENS:  None.  CULTURES:  None.  COMPLICATION:  None.  DRESSINGS:  Soft compressive, long-arm splint.  TOURNIQUET TIME:  45 minutes/  PROCEDURE:  The patient was brought to the operating room, placed on the operating table in supine position.  After adequate anesthesia had been obtained, elbow was examined.  Full motion and stable elbow.  Tourniquet applied, prepped and draped in usual sterile fashion.  Exsanguinated with elevation of Esmarch.  Tourniquet was inflated to 250 mmHg. Longitudinal incision within the lateral epicondyle distally.  Skin and subcutaneous tissue were divided.  Superficial extensors intact, divided longitudinally.  Marked mucinous degeneration tearing, extensor carpi radialis brevis tendon, lateral capsule from the level of the epicondyle down to the radial head.  All of this was  excised.  The joint looked good without instability or chondromalacia.  Epicondyle debrided. Treated with multiple drilling.  Wound irrigated.  The defect in the superficial extensors was closed longitudinally with Vicryl.  Skin and subcutaneous tissue with Vicryl.  Margins were injected with Marcaine. I then approached the volar aspect of the proximal forearm. Longitudinal incision was centered over the radial tunnel.  Skin and subcutaneous tissue were divided.  Extensor mass was identified and retracted.  Superficial branch of radial nerve was identified.  Followed proximally to the motor branch.  Followed distally.  Numerous crossing structures venous in nature and ligated with bipolar cautery.  Level of constriction at the arcade of Frohse.  Divided there and the muscle was split longitudinally at 2 cm.  Nice complete decompression.  Prior to that, there was a moderate amount of prestenotic dilatation and obvious irritation nerve at that level.  Wound was irrigated.  Closed the subcutaneous with subcuticular Vicryl.  Margins were injected with Marcaine.  Sterile compressive dressing was applied.  Tourniquet was deflated and removed.  Long-arm splint applied.  Anesthesia was reversed.  Brought to the recovery room.  Tolerated the surgery well. No complications.     Loreta Ave, M.D.     DFM/MEDQ  D:  10/11/2012  T:  10/11/2012  Job:  [id number]

## 2012-10-29 ENCOUNTER — Ambulatory Visit: Payer: BC Managed Care – PPO | Attending: Surgery | Admitting: Physical Therapy

## 2012-10-29 DIAGNOSIS — R5381 Other malaise: Secondary | ICD-10-CM | POA: Insufficient documentation

## 2012-10-29 DIAGNOSIS — M25529 Pain in unspecified elbow: Secondary | ICD-10-CM | POA: Insufficient documentation

## 2012-10-29 DIAGNOSIS — IMO0001 Reserved for inherently not codable concepts without codable children: Secondary | ICD-10-CM | POA: Insufficient documentation

## 2012-11-05 ENCOUNTER — Ambulatory Visit: Payer: BC Managed Care – PPO | Admitting: Physical Therapy

## 2012-11-12 ENCOUNTER — Ambulatory Visit: Payer: BC Managed Care – PPO | Admitting: Physical Therapy

## 2012-11-19 ENCOUNTER — Ambulatory Visit: Payer: BC Managed Care – PPO | Admitting: Physical Therapy

## 2012-11-29 ENCOUNTER — Ambulatory Visit: Payer: BC Managed Care – PPO | Attending: Surgery | Admitting: Physical Therapy

## 2012-11-29 DIAGNOSIS — M25529 Pain in unspecified elbow: Secondary | ICD-10-CM | POA: Insufficient documentation

## 2012-11-29 DIAGNOSIS — IMO0001 Reserved for inherently not codable concepts without codable children: Secondary | ICD-10-CM | POA: Insufficient documentation

## 2012-11-29 DIAGNOSIS — R5381 Other malaise: Secondary | ICD-10-CM | POA: Insufficient documentation

## 2012-12-06 ENCOUNTER — Encounter: Payer: BC Managed Care – PPO | Admitting: Physical Therapy

## 2012-12-11 ENCOUNTER — Ambulatory Visit: Payer: BC Managed Care – PPO | Admitting: Physical Therapy

## 2012-12-18 ENCOUNTER — Encounter: Payer: BC Managed Care – PPO | Admitting: Physical Therapy

## 2012-12-25 ENCOUNTER — Encounter: Payer: BC Managed Care – PPO | Admitting: Physical Therapy

## 2012-12-28 ENCOUNTER — Encounter: Payer: BC Managed Care – PPO | Admitting: Physical Therapy

## 2013-01-03 ENCOUNTER — Ambulatory Visit: Payer: BC Managed Care – PPO | Attending: Surgery | Admitting: Physical Therapy

## 2013-01-03 DIAGNOSIS — IMO0001 Reserved for inherently not codable concepts without codable children: Secondary | ICD-10-CM | POA: Insufficient documentation

## 2013-01-03 DIAGNOSIS — R5381 Other malaise: Secondary | ICD-10-CM | POA: Insufficient documentation

## 2013-01-03 DIAGNOSIS — M25529 Pain in unspecified elbow: Secondary | ICD-10-CM | POA: Insufficient documentation

## 2013-01-28 ENCOUNTER — Telehealth: Payer: Self-pay | Admitting: Internal Medicine

## 2013-01-28 NOTE — Telephone Encounter (Signed)
01-28-13 lmm @247pm  for pt to set up past due defib ck with brooke or klein/mt

## 2013-03-14 ENCOUNTER — Ambulatory Visit (INDEPENDENT_AMBULATORY_CARE_PROVIDER_SITE_OTHER): Payer: BC Managed Care – PPO | Admitting: *Deleted

## 2013-03-14 ENCOUNTER — Other Ambulatory Visit: Payer: Self-pay

## 2013-03-14 ENCOUNTER — Encounter: Payer: Self-pay | Admitting: Internal Medicine

## 2013-03-14 DIAGNOSIS — I428 Other cardiomyopathies: Secondary | ICD-10-CM

## 2013-03-14 DIAGNOSIS — Z95810 Presence of automatic (implantable) cardiac defibrillator: Secondary | ICD-10-CM

## 2013-03-20 LAB — REMOTE ICD DEVICE
BATTERY VOLTAGE: 2.58 V
DEVICE MODEL ICD: 143696
FVT: 0
HV IMPEDENCE: 39 Ohm
RV LEAD AMPLITUDE: 18.1 mv
RV LEAD IMPEDENCE ICD: 427 Ohm
TZAT-0001FASTVT: 1
TZAT-0001SLOWVT: 1
TZAT-0013SLOWVT: 2
TZAT-0018FASTVT: NEGATIVE
TZAT-0018SLOWVT: NEGATIVE
TZAT-0018SLOWVT: NEGATIVE
TZON-0003FASTVT: 285.7 ms
TZST-0001FASTVT: 3
TZST-0001FASTVT: 5
TZST-0001SLOWVT: 3
TZST-0001SLOWVT: 4
TZST-0001SLOWVT: 7
TZST-0003FASTVT: 31 J
TZST-0003FASTVT: 31 J
TZST-0003SLOWVT: 31 J
TZST-0003SLOWVT: 31 J
VENTRICULAR PACING ICD: 0 pct
VF: 0

## 2013-03-22 ENCOUNTER — Encounter: Payer: Self-pay | Admitting: *Deleted

## 2013-03-25 ENCOUNTER — Encounter: Payer: Self-pay | Admitting: *Deleted

## 2013-05-21 ENCOUNTER — Encounter: Payer: Self-pay | Admitting: Cardiology

## 2013-05-22 ENCOUNTER — Ambulatory Visit (INDEPENDENT_AMBULATORY_CARE_PROVIDER_SITE_OTHER): Payer: BC Managed Care – PPO | Admitting: Cardiology

## 2013-05-22 ENCOUNTER — Encounter: Payer: Self-pay | Admitting: Cardiology

## 2013-05-22 VITALS — BP 120/90 | HR 61 | Ht 67.0 in | Wt 207.0 lb

## 2013-05-22 DIAGNOSIS — T464X5A Adverse effect of angiotensin-converting-enzyme inhibitors, initial encounter: Secondary | ICD-10-CM

## 2013-05-22 DIAGNOSIS — I498 Other specified cardiac arrhythmias: Secondary | ICD-10-CM

## 2013-05-22 DIAGNOSIS — L299 Pruritus, unspecified: Secondary | ICD-10-CM

## 2013-05-22 DIAGNOSIS — T44905A Adverse effect of unspecified drugs primarily affecting the autonomic nervous system, initial encounter: Secondary | ICD-10-CM

## 2013-05-22 DIAGNOSIS — R001 Bradycardia, unspecified: Secondary | ICD-10-CM

## 2013-05-22 DIAGNOSIS — R05 Cough: Secondary | ICD-10-CM

## 2013-05-22 DIAGNOSIS — R058 Other specified cough: Secondary | ICD-10-CM

## 2013-05-22 DIAGNOSIS — Z95810 Presence of automatic (implantable) cardiac defibrillator: Secondary | ICD-10-CM

## 2013-05-22 DIAGNOSIS — R059 Cough, unspecified: Secondary | ICD-10-CM

## 2013-05-22 DIAGNOSIS — I428 Other cardiomyopathies: Secondary | ICD-10-CM

## 2013-05-22 MED ORDER — CARVEDILOL 12.5 MG PO TABS: 12.5000 mg | ORAL_TABLET | Freq: Two times a day (BID) | ORAL | Status: DC

## 2013-05-22 MED ORDER — BENAZEPRIL HCL 10 MG PO TABS: 10.0000 mg | ORAL_TABLET | Freq: Every day | ORAL | Status: DC

## 2013-05-22 NOTE — Progress Notes (Signed)
HPI   Patient is seen today to followup cardiomyopathy. I have followed her for many years. However her last visit with me was March, 2012. She has been followed by a the ICD device clinic. Also she was seen in our office by Mr. Alben Spittle in the fall of 2013 to help clear her for elbow surgery. This was done in she's doing better.  She's not having any chest pain or significant shortness of breath. She has itching that occurs every 3 or 4 days. It seems unlikely that it is related to a medicine with this frequency. Her meds have not changed.  As part of today's evaluation I have reviewed my old notes and updated the current medical record. Also reviewed the other office notes. I have reviewed the report of her last echo from 2008.  Allergies  Allergen Reactions  . Avelox (Moxifloxacin Hcl In Nacl)     Breathing problems  . Kiwi Extract     Makes pt. Feel like her throat is closing    Current Outpatient Prescriptions  Medication Sig Dispense Refill  . aspirin EC 81 MG tablet Take two tablets by mouth once daily.      . benazepril (LOTENSIN) 5 MG tablet Take 5 mg by mouth daily.      . carvedilol (COREG) 12.5 MG tablet Take 25 mg by mouth daily.      [name] levothyroxine (SYNTHROID, LEVOTHROID) 88 MCG tablet Take 88 mcg by mouth daily before breakfast.      . OMEPRAZOLE PO Take 20 mg by mouth daily.       . fluticasone (FLONASE) 50 MCG/ACT nasal spray Place 2 sprays into the nose daily.      [name] loratadine (CLARITIN) 10 MG tablet As needed      . Vitamin D, Ergocalciferol, (DRISDOL) 50000 UNITS CAPS Take 50,000 Units by mouth every 7 (seven) days.       No current facility-administered medications for this visit.    History   Social History  . Marital Status: Widowed    Spouse Name: N/A    Number of Children: N/A  . Years of Education: N/A   Occupational History  . Widow    Social History Main Topics  . Smoking status: Never Smoker   . Smokeless tobacco: Never Used  . Alcohol Use:  No  . Drug Use: No  . Sexually Active: Not on file   Other Topics Concern  . Not on file   Social History Narrative  . No narrative on file    No family history on file.  Past Medical History  Diagnosis Date  . Nonischemic cardiomyopathy     Etiology unknown, diagnosed 2008, patient has never had catheterization or nuclear study as of March, 2011  . Bradycardia   . GERD (gastroesophageal reflux disease)   . Hypothyroidism   . Dyslipidemia     LDL elevation,Patient does not want statin  . TIA (transient ischemic attack)     No CT or MRI abnormality, aspirin therapy  . Weight gain   . Leg swelling     right leg  . Pain     left leg & left knee  . Cough due to angiotensin-converting enzyme inhibitor     But patient chooses to continue ACE  . ICD (implantable cardiac defibrillator) battery depletion     Dr. Graciela Husbands, June, 2009(artifactual atrial tachycardia response events addressed by reprogramming in parentheses  . Dizziness     Dizziness with standing after squatting,  March, 2012  . Arthritis   . Itching     Past Surgical History  Procedure Laterality Date  . Cardiac defibrillator placement      [facility] no remote  . Tubal ligation  94  . Upper gi endoscopy    . Colonoscopy    . Total thyroidectomy  2001  . Cyst excision      back of leg-lt   . Lateral epicondyle release  10/11/2012    Procedure: TENNIS ELBOW RELEASE;  Surgeon: Loreta Ave, MD;  Location: [facility];  Service: Orthopedics;  Laterality: Right;  RIGHT ELBOW: TENOTOMY ELBOW LATERAL EPICONDYLITIS TENNIS ELBOW: RADIAL TUNNEL RELEASE    Patient Active Problem List   Diagnosis Date Noted  . Itching   . Dizziness   . Nonischemic cardiomyopathy   . Bradycardia   . GERD (gastroesophageal reflux disease)   . Hypothyroidism   . Dyslipidemia   . Leg swelling   . Cough due to angiotensin-converting enzyme inhibitor   . ICD (implantable cardiac defibrillator), dual,st Judes     . FOOT PAIN 01/20/2010  . TIA 01/19/2010  . GERD 01/19/2010    ROS   Patient denies fever, chills, headache, sweats, rash, change in vision, change in hearing, chest pain, cough, nausea vomiting, urinary symptoms. All other systems are reviewed and are negative.  PHYSICAL EXAM  Patient is oriented to person time and place. Affect is normal. There is no jugulovenous distention. Lungs are clear. Respiratory effort is nonlabored. Cardiac exam reveals S1 and S2. There no clicks or significant murmurs. Abdomen is soft. Is no peripheral edema. There no musculoskeletal deformities. There are no skin rashes today.  Filed Vitals:   05/22/13 1421  BP: 120/90  Pulse: 61  Height: 5\' 7"  (1.702 m)  Weight: 207 lb (93.895 kg)    EKG is done today and reviewed by me. There is decreased anterior R-wave progression. There is no change from the past.  ASSESSMENT & PLAN

## 2013-05-22 NOTE — Assessment & Plan Note (Signed)
The patient's ICD is followed by our team.

## 2013-05-22 NOTE — Assessment & Plan Note (Signed)
The patient continues to have very mild cough related to her ACE. She wants to continue this medication. She's on a very small dose. We need to increase the dose. I will see what happens with her cough and we can decide about changing the medicine.

## 2013-05-22 NOTE — Assessment & Plan Note (Signed)
Her last echo was 2008. It is time to repeat an echo. In addition her beta blocker is not being dosed the way we want. Her carvedilol be changed to 12.5 twice a day. Penasco dose will be increased. She will call in blood pressures. I will then decide about other medications. In addition we will need to decide about adding spironolactone.  As part of today's evaluation I spent greater than 25 minutes with her total care. More than half of this has been spent with direct contact with her discussing all of the issues.

## 2013-05-22 NOTE — Patient Instructions (Addendum)
**Note De-Identified Theta Leaf Obfuscation** Your physician has requested that you have an echocardiogram. Echocardiography is a painless test that uses sound waves to create images of your heart. It provides your doctor with information about the size and shape of your heart and how well your heart's chambers and valves are working. This procedure takes approximately one hour. There are no restrictions for this procedure.  Your physician has recommended you make the following change in your medication: decrease Carvedilol 12.5 twice daily and increase Benazepril to 10 mg daily. Please call us at [phone] to report your blood pressure readings after medication changes.  Your physician wants you to follow-up in: 6 months. You will receive a reminder letter in the mail two months in advance. If you don't receive a letter, please call our office to schedule the follow-up appointment.

## 2013-05-22 NOTE — Assessment & Plan Note (Signed)
I have carefully reviewed the issue of her itching. I am not convinced that is related to medication. No change in her meds at this time.

## 2013-05-31 ENCOUNTER — Telehealth: Payer: Self-pay | Admitting: Cardiology

## 2013-05-31 NOTE — Telephone Encounter (Signed)
**Note De-Identified [name] [name] Obfuscation** At pts last OV with Dr Myrtis Ser on 05/22/13 she was advised to decrease Carvedilol to 12.5 mg bid and to increase Benazepril to 10 mg daily. Pt states she is doing well with medication changes and that her BP on Thursday (8/7) was 119/79 and on Friday (8/8) her BP was 119/79. Will forward note to Dr Myrtis Ser for his review.

## 2013-05-31 NOTE — Telephone Encounter (Signed)
New Prob      Pt calling to report her BP:  Friday 124/78 Thursday 119/79

## 2013-06-10 ENCOUNTER — Ambulatory Visit (HOSPITAL_COMMUNITY): Payer: BC Managed Care – PPO | Attending: Cardiology

## 2013-06-10 DIAGNOSIS — Z95810 Presence of automatic (implantable) cardiac defibrillator: Secondary | ICD-10-CM | POA: Insufficient documentation

## 2013-06-10 DIAGNOSIS — Z87891 Personal history of nicotine dependence: Secondary | ICD-10-CM | POA: Insufficient documentation

## 2013-06-10 DIAGNOSIS — R609 Edema, unspecified: Secondary | ICD-10-CM | POA: Insufficient documentation

## 2013-06-10 DIAGNOSIS — I428 Other cardiomyopathies: Secondary | ICD-10-CM | POA: Insufficient documentation

## 2013-06-10 NOTE — Progress Notes (Signed)
Echocardiogram performed.  

## 2013-06-13 ENCOUNTER — Encounter: Payer: Self-pay | Admitting: Cardiology

## 2013-06-13 DIAGNOSIS — R9430 Abnormal result of cardiovascular function study, unspecified: Secondary | ICD-10-CM | POA: Insufficient documentation

## 2013-06-14 ENCOUNTER — Telehealth: Payer: Self-pay | Admitting: Cardiology

## 2013-06-14 ENCOUNTER — Other Ambulatory Visit: Payer: Self-pay

## 2013-06-14 ENCOUNTER — Telehealth: Payer: Self-pay

## 2013-06-14 MED ORDER — CARVEDILOL 12.5 MG PO TABS: 12.5000 mg | ORAL_TABLET | Freq: Two times a day (BID) | ORAL | Status: DC

## 2013-06-14 MED ORDER — BENAZEPRIL HCL 10 MG PO TABS: 10.0000 mg | ORAL_TABLET | Freq: Every day | ORAL | Status: DC

## 2013-06-14 NOTE — Telephone Encounter (Signed)
The pt states she is doing well since med changes at last OV with Dr Myrtis Ser on 7/30. The changes were Carvedilol from 25 mg daily to 12.5 mg bid and Benazepril from 5 mg daily to 10 mg daily.

## 2013-06-14 NOTE — Telephone Encounter (Signed)
F/up  ° °Returning call for results.  °

## 2013-06-14 NOTE — Telephone Encounter (Signed)
New Problem  Pt returning a call from lynn

## 2013-06-14 NOTE — Telephone Encounter (Addendum)
Pt given results of her Echo, she verbalized understanding.  

## 2013-08-29 ENCOUNTER — Other Ambulatory Visit: Payer: Self-pay

## 2013-11-20 ENCOUNTER — Encounter: Payer: BC Managed Care – PPO | Admitting: Internal Medicine

## 2013-12-24 ENCOUNTER — Ambulatory Visit (INDEPENDENT_AMBULATORY_CARE_PROVIDER_SITE_OTHER): Payer: BC Managed Care – PPO | Admitting: Internal Medicine

## 2013-12-24 VITALS — BP 134/74 | HR 65 | Ht 67.0 in | Wt 200.5 lb

## 2013-12-24 DIAGNOSIS — R001 Bradycardia, unspecified: Secondary | ICD-10-CM

## 2013-12-24 DIAGNOSIS — I498 Other specified cardiac arrhythmias: Secondary | ICD-10-CM

## 2013-12-24 DIAGNOSIS — Z95810 Presence of automatic (implantable) cardiac defibrillator: Secondary | ICD-10-CM

## 2013-12-24 DIAGNOSIS — I428 Other cardiomyopathies: Secondary | ICD-10-CM

## 2013-12-24 LAB — MDC_IDC_ENUM_SESS_TYPE_INCLINIC
Battery Voltage: 2.57 V
Brady Statistic RA Percent Paced: 0 %
Brady Statistic RV Percent Paced: 0 %
HIGH POWER IMPEDANCE MEASURED VALUE: 44 Ohm
HighPow Impedance: 35 Ohm
Implantable Pulse Generator Serial Number: 143696
Lead Channel Impedance Value: 435 Ohm
Lead Channel Pacing Threshold Amplitude: 0.2 V — CL
Lead Channel Pacing Threshold Amplitude: 0.6 V
Lead Channel Pacing Threshold Pulse Width: 0.4 ms
Lead Channel Sensing Intrinsic Amplitude: 0.9 mV
Lead Channel Setting Pacing Amplitude: 2 V
Lead Channel Setting Pacing Pulse Width: 0.4 ms
MDC IDC MSMT LEADCHNL RV IMPEDANCE VALUE: 468 Ohm
MDC IDC MSMT LEADCHNL RV PACING THRESHOLD PULSEWIDTH: 0.4 ms
MDC IDC MSMT LEADCHNL RV SENSING INTR AMPL: 20.3 mV
MDC IDC SESS DTM: 20150303050000
MDC IDC SET LEADCHNL RV PACING AMPLITUDE: 2.4 V
MDC IDC SET ZONE DETECTION INTERVAL: 285 ms
Zone Setting Detection Interval: 250 ms
Zone Setting Detection Interval: 333 ms

## 2013-12-24 NOTE — Progress Notes (Signed)
Patient Care Team: Terald Sleeper as PCP - General (General Practice)   HPI  [name] [name] is a 53 y.o. female seen in followup for her nonischemic cardiomyopathy and bradycardia status post ICD implantation for primary prevention.  The patient is seen for followup of cardiomyopathy. The cardiac viewpoint she is doing well. She is taking appropriate medications. She has only mild shortness of breath. No chest pain.  Echocardiogram 8/14 demonstrated ejection fraction somewhat improved at 45%    Past Medical History  Diagnosis Date  . Nonischemic cardiomyopathy     Etiology unknown, diagnosed 2008, patient has never had catheterization or nuclear study as of March, 2011  . Bradycardia   . GERD (gastroesophageal reflux disease)   . Hypothyroidism   . Dyslipidemia     LDL elevation,Patient does not want statin  . TIA (transient ischemic attack)     No CT or MRI abnormality, aspirin therapy  . Weight gain   . Leg swelling     right leg  . Pain     left leg & left knee  . Cough due to angiotensin-converting enzyme inhibitor     But patient chooses to continue ACE  . ICD (implantable cardiac defibrillator) battery depletion     Dr. [name], June, 2009(artifactual atrial tachycardia response events addressed by reprogramming in parentheses  . Dizziness     Dizziness with standing after squatting, March, 2012  . Arthritis   . Itching   . Ejection fraction < 50%     Past Surgical History  Procedure Laterality Date  . Cardiac defibrillator placement      [facility] Scientific no remote  . Tubal ligation  94  . Upper gi endoscopy    . Colonoscopy    . Total thyroidectomy  2001  . Cyst excision      back of leg-lt   . Lateral epicondyle release  10/11/2012    Procedure: TENNIS ELBOW RELEASE;  Surgeon: [name], MD;  Location: [place];  Service: Orthopedics;  Laterality: Right;  RIGHT ELBOW: TENOTOMY ELBOW LATERAL EPICONDYLITIS TENNIS ELBOW: RADIAL  TUNNEL RELEASE    Current Outpatient Prescriptions  Medication Sig Dispense Refill  . aspirin EC 81 MG tablet Take two tablets by mouth once daily.      . benazepril (LOTENSIN) 10 MG tablet Take 1 tablet (10 mg total) by mouth daily.  30 tablet  6  . carvedilol (COREG) 12.5 MG tablet Take 1 tablet (12.5 mg total) by mouth 2 (two) times daily with a meal.  60 tablet  6  . esomeprazole (NEXIUM) 20 MG capsule Take 20 mg by mouth every other day.      . fexofenadine-pseudoephedrine (ALLEGRA-D) 60-120 MG per tablet Take 1 tablet by mouth as needed.      [name] levothyroxine (SYNTHROID, LEVOTHROID) 88 MCG tablet Take 88 mcg by mouth daily before breakfast.      . Vitamin D, Ergocalciferol, (DRISDOL) 50000 UNITS CAPS Take 50,000 Units by mouth every 7 (seven) days.       No current facility-administered medications for this visit.    Allergies  Allergen Reactions  . Avelox [Moxifloxacin Hcl In Nacl]     Breathing problems  . Kiwi Extract     Makes pt. Feel like her throat is closing    Review of Systems negative except from HPI and PMH  Physical Exam BP 134/74  Pulse 65  Ht 5\' 7"  (1.702 m)  Wt 200 lb  8 oz (90.946 kg)  BMI 31.40 kg/m2 Well developed and well nourished in no acute distress HENT normal E scleral and icterus clear Neck Supple JVP flat; carotids brisk and full Clear to ausculation Device pocket well healed; without hematoma or erythema.  There is no tethering Regular rate and rhythm, no murmurs gallops or rub Soft with active bowel sounds No clubbing cyanosis none Edema Alert and oriented, grossly normal motor and sensory function Skin Warm and Dry    Assessment and  Plan  NICM  CHF chronic systolic  ICD Medtronic  The patient's device was interrogated and the information was fully reviewed.  The device was reprogrammed to  prolong detection intervals   She is euvolemic. She is on beta blockers and ACE inhibitors. I wonder whether African American she should be  on hydralazine nitrates. I will refer her back to  Dr. Ron Parker to make that decision.

## 2013-12-24 NOTE — Patient Instructions (Signed)
Your physician recommends that you continue on your current medications as directed. Please refer to the Current Medication list given to you today.  Your physician recommends that you schedule a follow-up appointment in: 2 months with Dr. Ron Parker.  Your physician wants you to follow-up in: 10 months with Dr. [name].  You will receive a reminder letter in the mail two months in advance. If you don't receive a letter, please call our office to schedule the follow-up appointment.

## 2014-01-01 ENCOUNTER — Telehealth: Payer: Self-pay | Admitting: Cardiology

## 2014-01-01 NOTE — Telephone Encounter (Signed)
Pt has question re carvedilol and if she is taking right pls call

## 2014-01-02 ENCOUNTER — Encounter: Payer: Self-pay | Admitting: Internal Medicine

## 2014-01-06 NOTE — Telephone Encounter (Signed)
The pt is advised that at her last OV with Dr Ron Parker on 05/22/13 her Carvedilol was decreased to 12.5 mg twice daily. She verbalized understanding.

## 2014-02-26 ENCOUNTER — Other Ambulatory Visit: Payer: Self-pay

## 2014-02-26 ENCOUNTER — Ambulatory Visit (INDEPENDENT_AMBULATORY_CARE_PROVIDER_SITE_OTHER): Payer: BC Managed Care – PPO | Admitting: Cardiology

## 2014-02-26 ENCOUNTER — Encounter: Payer: Self-pay | Admitting: Cardiology

## 2014-02-26 VITALS — BP 114/64 | HR 64 | Ht 67.0 in | Wt 208.0 lb

## 2014-02-26 DIAGNOSIS — R001 Bradycardia, unspecified: Secondary | ICD-10-CM

## 2014-02-26 DIAGNOSIS — T464X5A Adverse effect of angiotensin-converting-enzyme inhibitors, initial encounter: Secondary | ICD-10-CM

## 2014-02-26 DIAGNOSIS — R05 Cough: Secondary | ICD-10-CM

## 2014-02-26 DIAGNOSIS — E039 Hypothyroidism, unspecified: Secondary | ICD-10-CM

## 2014-02-26 DIAGNOSIS — Z95810 Presence of automatic (implantable) cardiac defibrillator: Secondary | ICD-10-CM

## 2014-02-26 DIAGNOSIS — E785 Hyperlipidemia, unspecified: Secondary | ICD-10-CM

## 2014-02-26 DIAGNOSIS — R059 Cough, unspecified: Secondary | ICD-10-CM

## 2014-02-26 DIAGNOSIS — I498 Other specified cardiac arrhythmias: Secondary | ICD-10-CM

## 2014-02-26 DIAGNOSIS — T465X5A Adverse effect of other antihypertensive drugs, initial encounter: Secondary | ICD-10-CM

## 2014-02-26 DIAGNOSIS — I5042 Chronic combined systolic (congestive) and diastolic (congestive) heart failure: Secondary | ICD-10-CM | POA: Insufficient documentation

## 2014-02-26 DIAGNOSIS — G459 Transient cerebral ischemic attack, unspecified: Secondary | ICD-10-CM

## 2014-02-26 DIAGNOSIS — I5022 Chronic systolic (congestive) heart failure: Secondary | ICD-10-CM

## 2014-02-26 DIAGNOSIS — I509 Heart failure, unspecified: Secondary | ICD-10-CM

## 2014-02-26 DIAGNOSIS — R058 Other specified cough: Secondary | ICD-10-CM

## 2014-02-26 DIAGNOSIS — I428 Other cardiomyopathies: Secondary | ICD-10-CM

## 2014-02-26 LAB — BASIC METABOLIC PANEL
BUN: 11 mg/dL (ref 6–23)
CHLORIDE: 106 meq/L (ref 96–112)
CO2: 29 mEq/L (ref 19–32)
Calcium: 9 mg/dL (ref 8.4–10.5)
Creatinine, Ser: 0.9 mg/dL (ref 0.4–1.2)
GFR: 89.69 mL/min (ref >60.00)
Glucose, Bld: 105 mg/dL — ABNORMAL HIGH (ref 70–99)
POTASSIUM: 3.6 meq/L (ref 3.5–5.1)
SODIUM: 140 meq/L (ref 135–145)

## 2014-02-26 MED ORDER — CARVEDILOL 12.5 MG PO TABS: 12.5000 mg | ORAL_TABLET | Freq: Two times a day (BID) | ORAL | Status: DC

## 2014-02-26 MED ORDER — SPIRONOLACTONE 12.5 MG HALF TABLET: 12.5000 mg | ORAL_TABLET | Freq: Every day | ORAL | Status: DC

## 2014-02-26 MED ORDER — BENAZEPRIL HCL 10 MG PO TABS: 10.0000 mg | ORAL_TABLET | Freq: Every day | ORAL | Status: DC

## 2014-02-26 NOTE — Patient Instructions (Signed)
Your physician has recommended you make the following change in your medication: start taking Spironolatone 12.5 mg daily (do not start taking this medication until we get the results of your lab work from today).  Your physician recommends that you return for lab work in: today and on May 18   Your physician wants you to follow-up in: 6 months. You will receive a reminder letter in the mail two months in advance. If you don't receive a letter, please call our office to schedule the follow-up appointment.

## 2014-02-26 NOTE — Assessment & Plan Note (Signed)
Her thyroid is being treated. No change in therapy.

## 2014-02-26 NOTE — Assessment & Plan Note (Signed)
The patient's cardiomyopathy is thought to be nonischemic. However she has never undergone cardiac catheterization. Her most recent echo showed improvement in LV function with an ejection fraction up to 45%. She is on reasonable dose of carvedilol. She is on a low dose of an ACE inhibitor. I had increased the dose at that time the last visit. I want to increase the dose further. In addition I had a discussion with her about adding spironolactone. At this point she seems willing to consider it. I have talked with her about possible additional benefits from nitrates and hydralazine. At this time she is class II and her EF is not below 40. Therefore I feel that there is no strong recommendation concerning using nitrates and hydralazine in addition to other medications in this African American patient.  As part of today's evaluation I had a very long and complete discussion with the patient. We discussed what we know about the etiology of her cardiomyopathy. I discussed veery behind all of the medications that are being considered. I spent more than 25 minutes with her total care. More than half of this time was involved with the discussions that I have outlined.

## 2014-02-26 NOTE — Assessment & Plan Note (Signed)
The patient had a TIA in the past. Etiology was never clear. She is on aspirin. No further workup is needed.

## 2014-02-26 NOTE — Addendum Note (Signed)
**Note De-Identified [name] [name] Obfuscation** Addended by: Dennie Fetters on: 02/26/2014 06:01 PM   Modules accepted: Orders

## 2014-02-26 NOTE — Progress Notes (Signed)
Patient ID: [name] [name], female   DOB: 1961-06-13, 53 y.o.   MRN: [id number]    HPI  Patient is seen today to follow up her cardiomyopathy and bradycardia post ICD. The patient has a cardiomyopathy. We believe it is nonischemic. The patient has never undergone cardiac catheterization. Historically her EF was 30-35%. Most recent echo in August, 2014 showed an ejection fraction of 45%. Historically it has been difficult for me to fully titrate her meds. She thinks she may have a mild cough from the ACE inhibitor. However she always wants to remain on this. I have been ready to change her to an ARB at any time. We have not yet tried spironolactone. When she saw Dr. [name] recently he raised the question about whether or not nitrates and hydralazine might also be considered. In general the patient has always wanted to be on fewer meds as opposed to more.  Allergies  Allergen Reactions  . Avelox [Moxifloxacin Hcl In Nacl]     Breathing problems  . Kiwi Extract     Makes pt. Feel like her throat is closing    Current Outpatient Prescriptions  Medication Sig Dispense Refill  . aspirin EC 81 MG tablet Take two tablets by mouth once daily.      . benazepril (LOTENSIN) 10 MG tablet Take 1 tablet (10 mg total) by mouth daily.  30 tablet  6  . carvedilol (COREG) 12.5 MG tablet Take 1 tablet (12.5 mg total) by mouth 2 (two) times daily with a meal.  60 tablet  6  . esomeprazole (NEXIUM) 20 MG capsule Take 20 mg by mouth every other day.      . fexofenadine-pseudoephedrine (ALLEGRA-D) 60-120 MG per tablet Take 1 tablet by mouth as needed.      . IRON PO Take 1 tablet by mouth daily.      [name] levothyroxine (SYNTHROID, LEVOTHROID) 88 MCG tablet Take 88 mcg by mouth daily before breakfast.      . Vitamin D, Ergocalciferol, (DRISDOL) 50000 UNITS CAPS Take 50,000 Units by mouth every 7 (seven) days.       No current facility-administered medications for this visit.    History   Social History  . Marital  Status: Widowed    Spouse Name: N/A    Number of Children: N/A  . Years of Education: N/A   Occupational History  . Widow    Social History Main Topics  . Smoking status: Never Smoker   . Smokeless tobacco: Never Used  . Alcohol Use: No  . Drug Use: No  . Sexual Activity: Not on file   Other Topics Concern  . Not on file   Social History Narrative  . No narrative on file    No family history on file.  Past Medical History  Diagnosis Date  . Nonischemic cardiomyopathy     Etiology unknown, diagnosed 2008, patient has never had catheterization or nuclear study as of March, 2011  . Bradycardia   . GERD (gastroesophageal reflux disease)   . Hypothyroidism   . Dyslipidemia     LDL elevation,Patient does not want statin  . TIA (transient ischemic attack)     No CT or MRI abnormality, aspirin therapy  . Weight gain   . Leg swelling     right leg  . Pain     left leg & left knee  . Cough due to angiotensin-converting enzyme inhibitor     But patient chooses to continue ACE  . ICD (  implantable cardiac defibrillator) battery depletion     Dr. [name], June, 2009(artifactual atrial tachycardia response events addressed by reprogramming in parentheses  . Dizziness     Dizziness with standing after squatting, March, 2012  . Arthritis   . Itching   . Ejection fraction < 50%     Past Surgical History  Procedure Laterality Date  . Cardiac defibrillator placement      [facility] no remote  . Tubal ligation  94  . Upper gi endoscopy    . Colonoscopy    . Total thyroidectomy  2001  . Cyst excision      back of leg-lt   . Lateral epicondyle release  10/11/2012    Procedure: TENNIS ELBOW RELEASE;  Surgeon: [name], MD;  Location: [place];  Service: Orthopedics;  Laterality: Right;  RIGHT ELBOW: TENOTOMY ELBOW LATERAL EPICONDYLITIS TENNIS ELBOW: RADIAL TUNNEL RELEASE    Patient Active Problem List   Diagnosis Date Noted  . Ejection fraction  < 50%   . Itching   . Dizziness   . Nonischemic cardiomyopathy   . Bradycardia   . GERD (gastroesophageal reflux disease)   . Hypothyroidism   . Dyslipidemia   . Leg swelling   . Cough due to angiotensin-converting enzyme inhibitor   . Dual implantable cardioverter-defibrillator in situ   . FOOT PAIN 01/20/2010  . TIA 01/19/2010    ROS   Patient denies fever, chills, headache, sweats, rash, change in vision, change in hearing, chest pain, cough, nausea vomiting, urinary symptoms. All other systems are reviewed and are negative.  PHYSICAL EXAM  Patient is oriented to person time and place. Affect is normal. She's quite stable. Head is atraumatic. There is no jugular venous distention. Sclera and conjunctiva are normal. Lungs are clear. Respiratory effort is nonlabored. Cardiac exam reveals S1 and S2. There are no clicks or significant murmurs. The abdomen is soft. There is no peripheral edema. There no musculoskeletal deformities. There are no skin rashes.  Filed Vitals:   02/26/14 1409  BP: 114/64  Pulse: 64  Height: 5\' 7"  (1.702 m)  Weight: 208 lb (94.348 kg)     ASSESSMENT & PLAN

## 2014-02-26 NOTE — Assessment & Plan Note (Signed)
The patient has a mild cough. However she continues to want to take her ACE inhibitor without changing to an ARB.

## 2014-02-26 NOTE — Assessment & Plan Note (Signed)
This is followed carefully by the electrophysiology team.

## 2014-02-26 NOTE — Assessment & Plan Note (Signed)
Her rate is under reasonable control with her current medications. No change in therapy.

## 2014-02-26 NOTE — Assessment & Plan Note (Signed)
Patient has not wanted to take a statin in the past. We do not have any proven coronary disease.

## 2014-03-12 ENCOUNTER — Other Ambulatory Visit: Payer: BC Managed Care – PPO

## 2014-03-12 ENCOUNTER — Other Ambulatory Visit (INDEPENDENT_AMBULATORY_CARE_PROVIDER_SITE_OTHER): Payer: BC Managed Care – PPO

## 2014-03-12 DIAGNOSIS — E785 Hyperlipidemia, unspecified: Secondary | ICD-10-CM

## 2014-03-12 DIAGNOSIS — I5022 Chronic systolic (congestive) heart failure: Secondary | ICD-10-CM

## 2014-03-12 DIAGNOSIS — I509 Heart failure, unspecified: Secondary | ICD-10-CM

## 2014-03-12 LAB — BASIC METABOLIC PANEL
BUN: 16 mg/dL (ref 6–23)
CO2: 29 meq/L (ref 19–32)
CREATININE: 1.1 mg/dL (ref 0.4–1.2)
Calcium: 9.6 mg/dL (ref 8.4–10.5)
Chloride: 105 mEq/L (ref 96–112)
GFR: 68.02 mL/min (ref >60.00)
GLUCOSE: 69 mg/dL — AB (ref 70–99)
Potassium: 4.3 mEq/L (ref 3.5–5.1)
Sodium: 140 mEq/L (ref 135–145)

## 2014-03-20 ENCOUNTER — Telehealth: Payer: Self-pay

## 2014-03-20 MED ORDER — SPIRONOLACTONE 25 MG PO TABS: 25.0000 mg | ORAL_TABLET | Freq: Every day | ORAL | Status: DC

## 2014-03-20 NOTE — Telephone Encounter (Signed)
The pt is advised and she verbalized understanding. She states that she is at work and will call back to schedule her BMET.which is to be drawn in 3 weeks

## 2014-03-20 NOTE — Telephone Encounter (Signed)
**Note De-Identified Harneet Noblett Obfuscation** Message copied by Kanishk Stroebel, Deliah Boston on Thu Mar 20, 2014  9:09 AM ------      Message from: Ron Parker, [place]      Created: Wed Mar 19, 2014  4:25 PM       Please apologize for the late return of the results. Her lab from the 20th was good. She can increase spironolactone to a full tablet, 25 mg. Plan 1 followup lab in another 3 weeks ------

## 2014-03-27 ENCOUNTER — Telehealth: Payer: Self-pay | Admitting: Internal Medicine

## 2014-03-27 ENCOUNTER — Ambulatory Visit (INDEPENDENT_AMBULATORY_CARE_PROVIDER_SITE_OTHER): Payer: BC Managed Care – PPO | Admitting: *Deleted

## 2014-03-27 DIAGNOSIS — I428 Other cardiomyopathies: Secondary | ICD-10-CM

## 2014-03-27 LAB — MDC_IDC_ENUM_SESS_TYPE_REMOTE
HIGH POWER IMPEDANCE MEASURED VALUE: 40 Ohm
Implantable Pulse Generator Serial Number: 143696
Lead Channel Impedance Value: 454 Ohm
Lead Channel Impedance Value: 495 Ohm
Lead Channel Sensing Intrinsic Amplitude: 17.6 mV
Lead Channel Setting Pacing Amplitude: 2 V
Lead Channel Setting Pacing Pulse Width: 0.4 ms
MDC IDC MSMT LEADCHNL RA SENSING INTR AMPL: 0.8 mV
MDC IDC SET LEADCHNL RV PACING AMPLITUDE: 2.4 V
MDC IDC SET ZONE DETECTION INTERVAL: 250 ms
MDC IDC SET ZONE DETECTION INTERVAL: 285 ms
MDC IDC STAT BRADY RA PERCENT PACED: 1 %
MDC IDC STAT BRADY RV PERCENT PACED: 0 %
Zone Setting Detection Interval: 333 ms

## 2014-03-27 NOTE — Telephone Encounter (Signed)
New message ° ° ° ° ° °Did we get her remote transmission? °

## 2014-03-27 NOTE — Telephone Encounter (Signed)
Transmission received.  Pt also wanted clarification restrictions riding a roller coaster. I let her know her heart would not get fast enough whether through exercise or adrenaline to reach her first detection zone of 180bpm.  I also cautioned her against coasters that use strong magnetic braking systems. I let her know if the coaster uses a lap only securing mechanism then that's fine. However, if the securing mechanism is over the head causing compression on/around her device pocket then potential pocket migration could occur.  Pt understood. Pt will use best judgment based on harness/safety mechanisms and coaster braking systems.

## 2014-03-28 NOTE — Progress Notes (Signed)
Remote ICD transmission.   

## 2014-04-23 ENCOUNTER — Encounter: Payer: Self-pay | Admitting: Cardiology

## 2014-04-28 ENCOUNTER — Encounter: Payer: Self-pay | Admitting: Cardiology

## 2014-04-29 ENCOUNTER — Telehealth: Payer: Self-pay | Admitting: Cardiology

## 2014-04-29 DIAGNOSIS — I5022 Chronic systolic (congestive) heart failure: Secondary | ICD-10-CM

## 2014-04-29 NOTE — Telephone Encounter (Signed)
New message     Patient wants to know when should she come in for lab work - starting new medication.

## 2014-04-29 NOTE — Telephone Encounter (Signed)
**Note De-Identified Lexie Koehl Obfuscation** On 5/28 the pt was advised to increase her Spironolactone to 25 mg daily and to have a BMET drawn 3 weeks after increase. The pt was at work at that time and she stated that she would call me back to schedule a date and time for her to have BMET drawn. The pt called this morning and scheduled an appointment for 05/02/14 (this Friday) to have BMET drawn. BMET has been ordered and the appointment has been scheduled.

## 2014-05-02 ENCOUNTER — Other Ambulatory Visit (INDEPENDENT_AMBULATORY_CARE_PROVIDER_SITE_OTHER): Payer: BC Managed Care – PPO

## 2014-05-02 DIAGNOSIS — I5022 Chronic systolic (congestive) heart failure: Secondary | ICD-10-CM

## 2014-05-02 LAB — BASIC METABOLIC PANEL
BUN: 12 mg/dL (ref 6–23)
CALCIUM: 9.4 mg/dL (ref 8.4–10.5)
CO2: 28 mEq/L (ref 19–32)
Chloride: 107 mEq/L (ref 96–112)
Creatinine, Ser: 0.8 mg/dL (ref 0.4–1.2)
GFR: 90.86 mL/min (ref >60.00)
GLUCOSE: 75 mg/dL (ref 70–99)
POTASSIUM: 3.7 meq/L (ref 3.5–5.1)
SODIUM: 141 meq/L (ref 135–145)

## 2014-05-13 ENCOUNTER — Encounter: Payer: Self-pay | Admitting: Internal Medicine

## 2014-07-01 ENCOUNTER — Ambulatory Visit (INDEPENDENT_AMBULATORY_CARE_PROVIDER_SITE_OTHER): Payer: BC Managed Care – PPO | Admitting: *Deleted

## 2014-07-01 DIAGNOSIS — I428 Other cardiomyopathies: Secondary | ICD-10-CM

## 2014-07-01 NOTE — Progress Notes (Signed)
Remote ICD transmission.   

## 2014-07-02 LAB — MDC_IDC_ENUM_SESS_TYPE_REMOTE
Battery Voltage: 2.55 V
Lead Channel Impedance Value: 458 Ohm
Lead Channel Sensing Intrinsic Amplitude: 1.1 mV
Lead Channel Setting Pacing Amplitude: 2.4 V
Lead Channel Setting Pacing Pulse Width: 0.4 ms
MDC IDC MSMT LEADCHNL RV IMPEDANCE VALUE: 495 Ohm
MDC IDC MSMT LEADCHNL RV SENSING INTR AMPL: 20.8 mV
MDC IDC PG SERIAL: 143696
MDC IDC SET LEADCHNL RA PACING AMPLITUDE: 2 V
MDC IDC SET ZONE DETECTION INTERVAL: 250 ms
MDC IDC STAT BRADY RA PERCENT PACED: 0 %
MDC IDC STAT BRADY RV PERCENT PACED: 0 %
Zone Setting Detection Interval: 285 ms
Zone Setting Detection Interval: 333 ms

## 2014-07-29 ENCOUNTER — Encounter: Payer: Self-pay | Admitting: Internal Medicine

## 2014-09-10 ENCOUNTER — Ambulatory Visit: Payer: BC Managed Care – PPO | Admitting: Cardiology

## 2014-09-17 ENCOUNTER — Ambulatory Visit (INDEPENDENT_AMBULATORY_CARE_PROVIDER_SITE_OTHER): Payer: BC Managed Care – PPO | Admitting: Cardiology

## 2014-09-17 ENCOUNTER — Encounter: Payer: Self-pay | Admitting: Cardiology

## 2014-09-17 ENCOUNTER — Encounter: Payer: Self-pay | Admitting: *Deleted

## 2014-09-17 VITALS — BP 136/90 | HR 61 | Ht 67.0 in | Wt 209.0 lb

## 2014-09-17 DIAGNOSIS — I428 Other cardiomyopathies: Secondary | ICD-10-CM

## 2014-09-17 DIAGNOSIS — I5022 Chronic systolic (congestive) heart failure: Secondary | ICD-10-CM

## 2014-09-17 DIAGNOSIS — E785 Hyperlipidemia, unspecified: Secondary | ICD-10-CM

## 2014-09-17 DIAGNOSIS — I429 Cardiomyopathy, unspecified: Secondary | ICD-10-CM

## 2014-09-17 DIAGNOSIS — Z95810 Presence of automatic (implantable) cardiac defibrillator: Secondary | ICD-10-CM

## 2014-09-17 MED ORDER — BENAZEPRIL HCL 20 MG PO TABS: 20.0000 mg | ORAL_TABLET | Freq: Every day | ORAL | Status: DC

## 2014-09-17 NOTE — Assessment & Plan Note (Signed)
Her ICD is beeping showing that she will need to have a replacement over time. This is being scheduled by the EP team.

## 2014-09-17 NOTE — Patient Instructions (Signed)
Your physician has recommended you make the following change in your medication:   INCREASE YOUR BENAZEPRIL TO 20 MG DAILY    *DR KATZ WOULD LIKE FOR YOU TO CHECK YOUR BP AT LEAST 2 TIMES A WEEK FOR SEVERAL WEEKS AND LOG THIS INFORMATION.  PLEASE CALL OUR OFFICE TO REPORT YOUR READINGS AT [phone], AND ASK FOR LYNN DR KATZ NURSE OR A TRIAGE NURSE.    Your physician wants you to follow-up in: [place] will receive a reminder letter in the mail two months in advance. If you don't receive a letter, please call our office to schedule the follow-up appointment.

## 2014-09-17 NOTE — Assessment & Plan Note (Signed)
We presume her car to myopathy is nonischemic. She has never had catheterization. Her EF was as high as 45% by her echo last year. I started low dose spironolactone. As we went to 25 mg she began to notice a burning sensation in her body. She feels certain that it was related to the drug. She does not want to restart spironolactone at this time. Her diastolic pressures higher than I want. I'll increase her benazepril. I have not yet started hydralazine and nitrates. Her ejection fraction is over 40%. Hydralazine and nitrates can still be considered.

## 2014-09-17 NOTE — Assessment & Plan Note (Signed)
Her volume status is stable. No change in therapy. 

## 2014-09-17 NOTE — Progress Notes (Signed)
Patient ID: [name] [name], female   DOB: [birth year], 53 y.o.   MRN: [id number]    HPI Patient is seen to follow-up cardiomyopathy. It is thought to be nonischemic. She has never undergone catheterization. I have tried to titrate her medications over time. I saw her last May, 2015. At that time we did add spironolactone following her labs carefully. Her labs remained stable. I had hoped to increase her dose to 25 mg daily. She then began to have a sensation of burning throughout her body. She stopped the spironolactone and feels sure that her symptom improved related to stopping this drug. Therefore she does not want to try restarting spironolactone. Overall she's doing and feeling well.  Allergies  Allergen Reactions  . Avelox [Moxifloxacin Hcl In Nacl]     Breathing problems  . Kiwi Extract     Makes pt. Feel like her throat is closing    Current Outpatient Prescriptions  Medication Sig Dispense Refill  . aspirin EC 81 MG tablet Take two tablets by mouth once daily.    . benazepril (LOTENSIN) 10 MG tablet Take 1 tablet (10 mg total) by mouth daily. 90 tablet 1  . carvedilol (COREG) 12.5 MG tablet Take 1 tablet (12.5 mg total) by mouth 2 (two) times daily with a meal. 180 tablet 1  . esomeprazole (NEXIUM) 20 MG capsule Take 20 mg by mouth every other day.    . fexofenadine-pseudoephedrine (ALLEGRA-D) 60-120 MG per tablet Take 1 tablet by mouth as needed.    [name] levothyroxine (SYNTHROID, LEVOTHROID) 88 MCG tablet Take 88 mcg by mouth daily before breakfast.    . ibuprofen (ADVIL,MOTRIN) 800 MG tablet as needed.     No current facility-administered medications for this visit.    History   Social History  . Marital Status: Widowed    Spouse Name: N/A    Number of Children: N/A  . Years of Education: N/A   Occupational History  . Widow    Social History Main Topics  . Smoking status: Never Smoker   . Smokeless tobacco: Never Used  . Alcohol Use: No  . Drug Use: No  . Sexual  Activity: Not on file   Other Topics Concern  . Not on file   Social History Narrative    Family History  Problem Relation Age of Onset  . Heart attack Mother   . Hypertension Sister   . Hypertension Brother     Past Medical History  Diagnosis Date  . Nonischemic cardiomyopathy     Etiology unknown, diagnosed 2008, patient has never had catheterization or nuclear study as of March, 2011  . Bradycardia   . GERD (gastroesophageal reflux disease)   . Hypothyroidism   . Dyslipidemia     LDL elevation,Patient does not want statin  . TIA (transient ischemic attack)     No CT or MRI abnormality, aspirin therapy  . Weight gain   . Leg swelling     right leg  . Pain     left leg & left knee  . Cough due to angiotensin-converting enzyme inhibitor     But patient chooses to continue ACE  . ICD (implantable cardiac defibrillator) battery depletion     Dr. [name], June, 2009(artifactual atrial tachycardia response events addressed by reprogramming in parentheses  . Dizziness     Dizziness with standing after squatting, March, 2012  . Arthritis   . Itching   . Ejection fraction < 50%     Past Surgical History  Procedure Laterality Date  . Cardiac defibrillator placement      [facility] no remote  . Tubal ligation  94  . Upper gi endoscopy    . Colonoscopy    . Total thyroidectomy  2001  . Cyst excision      back of leg-lt   . Lateral epicondyle release  10/11/2012    Procedure: TENNIS ELBOW RELEASE;  Surgeon: [name], MD;  Location: [place];  Service: Orthopedics;  Laterality: Right;  RIGHT ELBOW: TENOTOMY ELBOW LATERAL EPICONDYLITIS TENNIS ELBOW: RADIAL TUNNEL RELEASE    Patient Active Problem List   Diagnosis Date Noted  . Chronic systolic CHF (congestive heart failure) 02/26/2014  . Ejection fraction < 50%   . Itching   . Dizziness   . Nonischemic cardiomyopathy   . Bradycardia   . GERD (gastroesophageal reflux disease)   .  Hypothyroidism   . Dyslipidemia   . Leg swelling   . Cough due to angiotensin-converting enzyme inhibitor   . Dual implantable cardioverter-defibrillator in situ   . FOOT PAIN 01/20/2010  . TIA 01/19/2010    ROS  Patient denies fever, chills, headache, sweats, rash, change in vision, change in hearing, chest pain, cough, nausea or vomiting, urinary symptoms. All other systems are reviewed and are negative.  PHYSICAL EXAM Patient is overweight. She is oriented to person time and place. Affect is normal. She looks good today. Head is atraumatic. Sclera and conjunctiva are normal. There is no jugular venous distention. Lungs are clear. Respiratory effort is nonlabored. Cardiac exam reveals S1 and S2. The abdomen is soft. There is no peripheral edema. There are no musculoskeletal deformities. There are no skin rashes.    Filed Vitals:   09/17/14 1356  Height: 5\' 7"  (1.702 m)  Weight: 209 lb (94.802 kg)   EKG is done today and reviewed by me. There is sinus rhythm. There are old nonspecific ST-T wave changes. There is old decreased anterior R wave progression.  ASSESSMENT & PLAN

## 2014-09-17 NOTE — Assessment & Plan Note (Signed)
The patient's LDL is in the range of 120. Her HDL is 68. Her calculated 10 year risk score is 4.7%. She does not want to take a statin and I have not pushed for this.

## 2014-09-29 ENCOUNTER — Telehealth: Payer: Self-pay | Admitting: *Deleted

## 2014-09-29 NOTE — Telephone Encounter (Signed)
Patient phoned in with two blood pressure readings  12/2 at 2:45 pm 122/68  12/4 at 2:25 pm 130/73  Advised would forward to Dr Ron Parker for review and call if any changes

## 2014-10-09 ENCOUNTER — Encounter: Payer: Self-pay | Admitting: Cardiology

## 2014-10-09 NOTE — Progress Notes (Signed)
Patient ID: [name] [name], female   DOB: January 15, 1961, 53 y.o.   MRN: [id number] I asked the patient call in some blood pressures from home. She did this and there was reasonable control. No change in therapy.   Daryel November, MD

## 2014-10-09 NOTE — Telephone Encounter (Signed)
Please contact the patient. Please apologized to her for my delay in getting back to her. I have reviewed the blood pressure she sent in. I am comfortable with these numbers. Continue the same medications as we outlined when she was in the office recently.

## 2014-10-13 NOTE — Telephone Encounter (Signed)
The pt is advised and she verbalized understanding. 

## 2014-11-06 ENCOUNTER — Ambulatory Visit (INDEPENDENT_AMBULATORY_CARE_PROVIDER_SITE_OTHER): Payer: BLUE CROSS/BLUE SHIELD | Admitting: Internal Medicine

## 2014-11-06 ENCOUNTER — Encounter: Payer: Self-pay | Admitting: Internal Medicine

## 2014-11-06 VITALS — BP 110/78 | HR 66 | Ht 68.0 in | Wt 213.2 lb

## 2014-11-06 DIAGNOSIS — I428 Other cardiomyopathies: Secondary | ICD-10-CM

## 2014-11-06 DIAGNOSIS — Z01812 Encounter for preprocedural laboratory examination: Secondary | ICD-10-CM

## 2014-11-06 DIAGNOSIS — I429 Cardiomyopathy, unspecified: Secondary | ICD-10-CM

## 2014-11-06 DIAGNOSIS — I5022 Chronic systolic (congestive) heart failure: Secondary | ICD-10-CM

## 2014-11-06 DIAGNOSIS — Z4502 Encounter for adjustment and management of automatic implantable cardiac defibrillator: Secondary | ICD-10-CM

## 2014-11-06 NOTE — Progress Notes (Signed)
Patient Care Team: Terald Sleeper, PA-C as PCP - General (General Practice)   HPI  [name] [name] is a 54 y.o. female seen in followup for her nonischemic cardiomyopathy and bradycardia status post ICD implantation for primary prevention.  Her device has reached ERI.   The patient is seen for followup of cardiomyopathy. The cardiac viewpoint she is doing well. She is taking appropriate medications. She has only mild shortness of breath. No chest pain.  Echocardiogram 8/14 demonstrated ejection fraction somewhat improved at 45%    Past Medical History  Diagnosis Date  . Nonischemic cardiomyopathy     Etiology unknown, diagnosed 2008, patient has never had catheterization or nuclear study as of March, 2011  . Bradycardia   . GERD (gastroesophageal reflux disease)   . Hypothyroidism   . Dyslipidemia     LDL elevation,Patient does not want statin  . TIA (transient ischemic attack)     No CT or MRI abnormality, aspirin therapy  . Weight gain   . Leg swelling     right leg  . Pain     left leg & left knee  . Cough due to angiotensin-converting enzyme inhibitor     But patient chooses to continue ACE  . ICD (implantable cardiac defibrillator) battery depletion     Dr. [name], June, 2009(artifactual atrial tachycardia response events addressed by reprogramming in parentheses  . Dizziness     Dizziness with standing after squatting, March, 2012  . Arthritis   . Itching   . Ejection fraction < 50%     Past Surgical History  Procedure Laterality Date  . Cardiac defibrillator placement      [facility] Scientific no remote  . Tubal ligation  94  . Upper gi endoscopy    . Colonoscopy    . Total thyroidectomy  2001  . Cyst excision      back of leg-lt   . Lateral epicondyle release  10/11/2012    Procedure: TENNIS ELBOW RELEASE;  Surgeon: [name], MD;  Location: [place];  Service: Orthopedics;  Laterality: Right;  RIGHT ELBOW: TENOTOMY ELBOW LATERAL  EPICONDYLITIS TENNIS ELBOW: RADIAL TUNNEL RELEASE    Current Outpatient Prescriptions  Medication Sig Dispense Refill  . aspirin EC 81 MG tablet Take two tablets by mouth once daily.    . benazepril (LOTENSIN) 20 MG tablet Take 1 tablet (20 mg total) by mouth daily. 90 tablet 6  . carvedilol (COREG) 12.5 MG tablet Take 1 tablet (12.5 mg total) by mouth 2 (two) times daily with a meal. 180 tablet 1  . esomeprazole (NEXIUM) 20 MG capsule Take 20 mg by mouth every other day.    . fexofenadine-pseudoephedrine (ALLEGRA-D) 60-120 MG per tablet Take 1 tablet by mouth as needed.    [name] ibuprofen (ADVIL,MOTRIN) 800 MG tablet as needed.    [name] levothyroxine (SYNTHROID, LEVOTHROID) 88 MCG tablet Take 88 mcg by mouth daily before breakfast.     No current facility-administered medications for this visit.    Allergies  Allergen Reactions  . Avelox [Moxifloxacin Hcl In Nacl]     Breathing problems  . Bactrim [Sulfamethoxazole-Trimethoprim] Swelling  . Kiwi Extract     Makes pt. Feel like her throat is closing    Review of Systems negative except from HPI and PMH  Physical Exam BP 110/78 mmHg  Pulse 66  Ht 5\' 8"  (1.727 m)  Wt 213 lb 3.2 oz (96.707 kg)  BMI 32.42 kg/m2 Well  developed and well nourished in no acute distress HENT normal E scleral and icterus clear Neck Supple JVP flat; carotids brisk and full Clear to ausculation Device pocket well healed; without hematoma or erythema.  There is no tethering Regular rate and rhythm, no murmurs gallops or rub Soft with active bowel sounds No clubbing cyanosis none Edema Alert and oriented, grossly normal motor and sensory function Skin Warm and Dry   ECG sinus with normal intervals  Assessment and  Plan  NICM iterval improvement  CHF chronic systolic-euvolemic  ICD Medtronic  The patient's device was interrogated and the information was fully reviewed.  The device was reprogrammed to  prolong detection intervals  We raised the issue  previously re Hdral/nitrates  This has been deferred for now  We discussed appropriate use criteria as relates to device generator replacement in a patient with a resolved nonischemic cardiomyopathy. He remains a reasonable consideration. The patient would like to proceed as the uncertainty of not having the device seems to her overwhelming. This is reasonable.  We will obtain a 2-D echo to look at LV function. This may help inform the need for hydralazine nitrates.  We have reviewed the benefits and risks of generator replacement.  These include but are not limited to lead fracture and infection.  The patient understands, agrees and is willing to proceed.

## 2014-11-06 NOTE — Patient Instructions (Signed)
Your physician recommends that you continue on your current medications as directed. Please refer to the Current Medication list given to you today.  Your physician has requested that you have an echocardiogram. Echocardiography is a painless test that uses sound waves to create images of your heart. It provides your doctor with information about the size and shape of your heart and how well your heart's chambers and valves are working. This procedure takes approximately one hour. There are no restrictions for this procedure.  Aycen Porreca, RN will call you by next week to arrange ICD generator change.  [phone]

## 2014-11-12 ENCOUNTER — Encounter: Payer: Self-pay | Admitting: Internal Medicine

## 2014-11-14 ENCOUNTER — Other Ambulatory Visit (HOSPITAL_COMMUNITY): Payer: BLUE CROSS/BLUE SHIELD

## 2014-11-17 ENCOUNTER — Other Ambulatory Visit: Payer: Self-pay | Admitting: Cardiology

## 2014-11-19 ENCOUNTER — Ambulatory Visit (HOSPITAL_COMMUNITY): Payer: BLUE CROSS/BLUE SHIELD | Attending: Cardiovascular Disease | Admitting: Radiology

## 2014-11-19 ENCOUNTER — Other Ambulatory Visit: Payer: Self-pay | Admitting: Cardiology

## 2014-11-19 DIAGNOSIS — I5022 Chronic systolic (congestive) heart failure: Secondary | ICD-10-CM | POA: Diagnosis not present

## 2014-11-19 DIAGNOSIS — I428 Other cardiomyopathies: Secondary | ICD-10-CM

## 2014-11-19 DIAGNOSIS — I429 Cardiomyopathy, unspecified: Secondary | ICD-10-CM

## 2014-11-19 NOTE — Progress Notes (Signed)
Echocardiogram performed.  

## 2014-11-24 ENCOUNTER — Encounter: Payer: Self-pay | Admitting: Cardiology

## 2014-11-24 NOTE — Progress Notes (Signed)
I have reviewed recent office notes and her last 2 echoes. I agree with Dr. Theodosia Blender assessment that her current ejection fraction is in the range of 35-40%. I also agree that her ejection fraction was lower than this at the time of the echo in August, 2014. It turns out that I read the study in August, 2014. I did over read the ejection fraction at that time. It is probably more in the 30% range at that time.  Over time I have discussed medications extensively with the patient. At this point I've chosen not to make a significant change in her medications.  I am aware that Dr. [name] is reviewing with the patient whether her ICD should be replaced as it is currently at end of life. As I outlined above, I agree that her current ejection fraction is 35-40%. Since her ejection fraction was lower one year ago, it appears that over time she does have real variation in her ejection fraction. I do not know if this should affect the current thought process concerning whether her ICD should be replaced at this time. The final decision will be up to Dr. [name] and the patient.  Daryel November, MD

## 2014-12-09 ENCOUNTER — Encounter: Payer: Self-pay | Admitting: *Deleted

## 2014-12-09 ENCOUNTER — Ambulatory Visit (INDEPENDENT_AMBULATORY_CARE_PROVIDER_SITE_OTHER): Payer: BLUE CROSS/BLUE SHIELD | Admitting: Internal Medicine

## 2014-12-09 ENCOUNTER — Encounter: Payer: Self-pay | Admitting: Internal Medicine

## 2014-12-09 VITALS — BP 126/82 | HR 86 | Ht 68.0 in | Wt 212.2 lb

## 2014-12-09 DIAGNOSIS — I429 Cardiomyopathy, unspecified: Secondary | ICD-10-CM

## 2014-12-09 DIAGNOSIS — I5022 Chronic systolic (congestive) heart failure: Secondary | ICD-10-CM

## 2014-12-09 DIAGNOSIS — Z4502 Encounter for adjustment and management of automatic implantable cardiac defibrillator: Secondary | ICD-10-CM

## 2014-12-09 DIAGNOSIS — Z01812 Encounter for preprocedural laboratory examination: Secondary | ICD-10-CM

## 2014-12-09 DIAGNOSIS — I428 Other cardiomyopathies: Secondary | ICD-10-CM

## 2014-12-09 LAB — MDC_IDC_ENUM_SESS_TYPE_INCLINIC
Battery Voltage: 2.39 V
Lead Channel Setting Pacing Amplitude: 2 V
Lead Channel Setting Pacing Amplitude: 2.4 V
Lead Channel Setting Pacing Pulse Width: 0.4 ms
MDC IDC PG SERIAL: 143696
MDC IDC SET ZONE DETECTION INTERVAL: 285 ms
Zone Setting Detection Interval: 250 ms
Zone Setting Detection Interval: 333 ms

## 2014-12-09 MED ORDER — ISOSORB DINITRATE-HYDRALAZINE 20-37.5 MG PO TABS: 1.0000 | ORAL_TABLET | Freq: Two times a day (BID) | ORAL | Status: DC

## 2014-12-09 MED ORDER — TORSEMIDE 10 MG PO TABS: 10.0000 mg | ORAL_TABLET | ORAL | Status: DC | PRN

## 2014-12-09 NOTE — Progress Notes (Signed)
Patient Care Team: Terald Sleeper, PA-C as PCP - General (General Practice)   HPI  [name] [name] is a 54 y.o. female seen in followup for her nonischemic cardiomyopathy and bradycardia status post ICD implantation for primary prevention.  Her device has reached ERI.   The patient is seen for followup of cardiomyopathy. The cardiac viewpoint she is doing well. She is taking appropriate medications. She has only mild shortness of breath. No chest pain.  Echocardiogram 8/14 demonstrated ejection fraction somewhat improved at 45%  But JK overread suggests really 30%  Repeat last month was 35-40%  She comes in with many questions and also complaints of abd fullness    Past Medical History  Diagnosis Date  . Nonischemic cardiomyopathy     Etiology unknown, diagnosed 2008, patient has never had catheterization or nuclear study as of March, 2011  . Bradycardia   . GERD (gastroesophageal reflux disease)   . Hypothyroidism   . Dyslipidemia     LDL elevation,Patient does not want statin  . TIA (transient ischemic attack)     No CT or MRI abnormality, aspirin therapy  . Weight gain   . Leg swelling     right leg  . Pain     left leg & left knee  . Cough due to angiotensin-converting enzyme inhibitor     But patient chooses to continue ACE  . ICD (implantable cardiac defibrillator) battery depletion     Dr. [name], June, 2009(artifactual atrial tachycardia response events addressed by reprogramming in parentheses  . Dizziness     Dizziness with standing after squatting, March, 2012  . Arthritis   . Itching   . Ejection fraction < 50%     Past Surgical History  Procedure Laterality Date  . Cardiac defibrillator placement      [facility] [employer] no remote  . Tubal ligation  94  . Upper gi endoscopy    . Colonoscopy    . Total thyroidectomy  2001  . Cyst excision      back of leg-lt   . Lateral epicondyle release  10/11/2012    Procedure: TENNIS ELBOW RELEASE;  Surgeon:  [name], MD;  Location: [place];  Service: Orthopedics;  Laterality: Right;  RIGHT ELBOW: TENOTOMY ELBOW LATERAL EPICONDYLITIS TENNIS ELBOW: RADIAL TUNNEL RELEASE    Current Outpatient Prescriptions  Medication Sig Dispense Refill  . aspirin EC 81 MG tablet Take two tablets by mouth once daily.    . benazepril (LOTENSIN) 20 MG tablet Take 1 tablet (20 mg total) by mouth daily. 90 tablet 6  . carvedilol (COREG) 12.5 MG tablet TAKE ONE TABLET BY MOUTH TWICE DAILY WITH  A  MEAL.  THIS  IS  A  DECREASE  IN  DOSE. 180 tablet 0  . fexofenadine-pseudoephedrine (ALLEGRA-D) 60-120 MG per tablet Take 1 tablet by mouth as needed.    [name] ibuprofen (ADVIL,MOTRIN) 800 MG tablet as needed.    [name] levothyroxine (SYNTHROID, LEVOTHROID) 88 MCG tablet Take 88 mcg by mouth daily before breakfast.    . omeprazole (PRILOSEC) 20 MG capsule Take 20 mg by mouth daily.     No current facility-administered medications for this visit.    Allergies  Allergen Reactions  . Avelox [Moxifloxacin Hcl In Nacl]     Breathing problems  . Bactrim [Sulfamethoxazole-Trimethoprim] Swelling  . Clindamycin/Lincomycin     Breathing/diarrhea   . Kiwi Extract     Makes pt. Feel like her throat  is closing    Review of Systems negative except from HPI and PMH  Physical Exam BP 126/82 mmHg  Pulse 86  Ht 5\' 8"  (1.727 m)  Wt 212 lb 3.2 oz (96.253 kg)  BMI 32.27 kg/m2 Well developed and well nourished in no acute distress HENT normal E scleral and icterus clear Neck Supple JVP 8  carotids brisk and full Clear to ausculation Device pocket well healed; without hematoma or erythema.  There is no tethering Regular rate and rhythm, no murmurs gallops or rub Soft with active bowel sounds No clubbing cyanosis none Edema Alert and oriented, grossly normal motor and sensory function Skin Warm and Dry   ECG sinus with normal intervals  Assessment and  Plan  NICM iterval improvement  CHF chronic  systolic-volume overloaded  ICD Medtronic  The patient's device was interrogated and the information was fully reviewed.  The device was reprogrammed to  prolong detection intervals   We discussed appropriate use criteria as relates to device generator replacement in a patient with an incompletely  resolved nonischemic cardiomyopathy.   We will begin  hydralazine nitrates.   She is volume overloaded and will begin torsemide 10 mg qod x 1 week then prn 2x week  We have reviewed the benefits and risks of generator replacement.  These include but are not limited to lead fracture and infection.  The patient understands, agrees and is willing to proceed.

## 2014-12-09 NOTE — Patient Instructions (Addendum)
Your physician has recommended you make the following change in your medication:  1) START Torsemide 10 mg every other day for 1 week. Then take it up to 2 times per week as needed. 2) START Bidil one table twice a day.  Your physician recommends that you return for pre procedure lab work on: 12/23/14  Your physician has recommended that you have a defibrillator generator change.  Please see the instruction sheet given to you today for more information.  You are scheduled for 12/29/14  Your wound check is scheduled for 01/08/15 at 4:00 p.m. at [address].   Thank you for choosing [facility]!!   Trinidad Curet, RN

## 2014-12-12 ENCOUNTER — Encounter (HOSPITAL_COMMUNITY): Payer: Self-pay | Admitting: *Deleted

## 2014-12-12 ENCOUNTER — Telehealth: Payer: Self-pay | Admitting: Internal Medicine

## 2014-12-12 ENCOUNTER — Emergency Department (HOSPITAL_COMMUNITY): Payer: BLUE CROSS/BLUE SHIELD

## 2014-12-12 ENCOUNTER — Emergency Department (HOSPITAL_COMMUNITY)
Admission: EM | Admit: 2014-12-12 | Discharge: 2014-12-13 | Disposition: A | Discharge status: Home / Self Care | Payer: BLUE CROSS/BLUE SHIELD | Attending: Emergency Medicine | Admitting: Emergency Medicine

## 2014-12-12 DIAGNOSIS — Z79899 Other long term (current) drug therapy: Secondary | ICD-10-CM | POA: Diagnosis not present

## 2014-12-12 DIAGNOSIS — Z8639 Personal history of other endocrine, nutritional and metabolic disease: Secondary | ICD-10-CM | POA: Insufficient documentation

## 2014-12-12 DIAGNOSIS — M1990 Unspecified osteoarthritis, unspecified site: Secondary | ICD-10-CM | POA: Diagnosis not present

## 2014-12-12 DIAGNOSIS — K219 Gastro-esophageal reflux disease without esophagitis: Secondary | ICD-10-CM | POA: Insufficient documentation

## 2014-12-12 DIAGNOSIS — R079 Chest pain, unspecified: Secondary | ICD-10-CM | POA: Diagnosis not present

## 2014-12-12 DIAGNOSIS — R0602 Shortness of breath: Secondary | ICD-10-CM | POA: Diagnosis not present

## 2014-12-12 DIAGNOSIS — Z8673 Personal history of transient ischemic attack (TIA), and cerebral infarction without residual deficits: Secondary | ICD-10-CM | POA: Insufficient documentation

## 2014-12-12 DIAGNOSIS — Z7982 Long term (current) use of aspirin: Secondary | ICD-10-CM | POA: Diagnosis not present

## 2014-12-12 DIAGNOSIS — Z4502 Encounter for adjustment and management of automatic implantable cardiac defibrillator: Secondary | ICD-10-CM | POA: Insufficient documentation

## 2014-12-12 DIAGNOSIS — E039 Hypothyroidism, unspecified: Secondary | ICD-10-CM | POA: Diagnosis not present

## 2014-12-12 DIAGNOSIS — Z872 Personal history of diseases of the skin and subcutaneous tissue: Secondary | ICD-10-CM | POA: Diagnosis not present

## 2014-12-12 LAB — BASIC METABOLIC PANEL
Anion gap: 9 (ref 5–15)
BUN: 10 mg/dL (ref 6–23)
CHLORIDE: 102 mmol/L (ref 96–112)
CO2: 28 mmol/L (ref 19–32)
Calcium: 9.3 mg/dL (ref 8.4–10.5)
Creatinine, Ser: 0.94 mg/dL (ref 0.50–1.10)
GFR calc Af Amer: 78 mL/min — ABNORMAL LOW (ref >90)
GFR calc non Af Amer: 68 mL/min — ABNORMAL LOW (ref >90)
Glucose, Bld: 90 mg/dL (ref 70–99)
Potassium: 4.4 mmol/L (ref 3.5–5.1)
Sodium: 139 mmol/L (ref 135–145)

## 2014-12-12 LAB — I-STAT TROPONIN, ED: Troponin i, poc: 0.01 ng/mL (ref 0.00–0.08)

## 2014-12-12 LAB — CBC
HCT: 38.4 % (ref 36.0–46.0)
Hemoglobin: 12.6 g/dL (ref 12.0–15.0)
MCH: 30.1 pg (ref 26.0–34.0)
MCHC: 32.8 g/dL (ref 30.0–36.0)
MCV: 91.6 fL (ref 78.0–100.0)
PLATELETS: 228 10*3/uL (ref 150–400)
RBC: 4.19 MIL/uL (ref 3.87–5.11)
RDW: 12.7 % (ref 11.5–15.5)
WBC: 5.1 10*3/uL (ref 4.0–10.5)

## 2014-12-12 LAB — D-DIMER, QUANTITATIVE: D-Dimer, Quant: 1.68 ug/mL-FEU — ABNORMAL HIGH (ref 0.00–0.48)

## 2014-12-12 LAB — BRAIN NATRIURETIC PEPTIDE: B Natriuretic Peptide: 24.7 pg/mL (ref 0.0–100.0)

## 2014-12-12 LAB — TROPONIN I: Troponin I: <0.03 ng/mL (ref <0.031)

## 2014-12-12 MED ORDER — IOHEXOL 350 MG/ML SOLN
100.0000 mL | Freq: Once | INTRAVENOUS | Status: AC | PRN
  Administered 2014-12-12: 100 mL via INTRAVENOUS

## 2014-12-12 NOTE — ED Provider Notes (Signed)
CSN: [id number]     Arrival date & time 12/12/14  1741 History   First MD Initiated Contact with Patient 12/12/14 2110     Chief Complaint  Patient presents with  . Chest Pain     (Consider location/radiation/quality/duration/timing/severity/associated sxs/prior Treatment) HPI Comments: Patient is a 16 female with a past medical history of nonischemic cardiomyopathy, GERD, previous TIA, dyslipidemia, ICD in place who presents with chest pain that started 3 days ago. Symptoms started gradually and progressively worsened since the onset. The pain is located in her generalized chest and is aching and moderate in severity. Patient reports worsening SOB which is worse with exertion. Patient reports having some chest pain at baseline which she attributes to her GERD but this feels different. She denies previous PE/DVT, exogenous estrogen, recent surgery/hospitalization/immobilization. No alleviating factors. No other associated symptoms    Past Medical History  Diagnosis Date  . Nonischemic cardiomyopathy     Etiology unknown, diagnosed 2008, patient has never had catheterization or nuclear study as of March, 2011  . Bradycardia   . GERD (gastroesophageal reflux disease)   . Hypothyroidism   . Dyslipidemia     LDL elevation,Patient does not want statin  . TIA (transient ischemic attack)     No CT or MRI abnormality, aspirin therapy  . Weight gain   . Leg swelling     right leg  . Pain     left leg & left knee  . Cough due to angiotensin-converting enzyme inhibitor     But patient chooses to continue ACE  . ICD (implantable cardiac defibrillator) battery depletion     Dr. [name], June, 2009(artifactual atrial tachycardia response events addressed by reprogramming in parentheses  . Dizziness     Dizziness with standing after squatting, March, 2012  . Arthritis   . Itching   . Ejection fraction < 50%    Past Surgical History  Procedure Laterality Date  . Cardiac defibrillator placement       [facility] Scientific no remote  . Tubal ligation  94  . Upper gi endoscopy    . Colonoscopy    . Total thyroidectomy  2001  . Cyst excision      back of leg-lt   . Lateral epicondyle release  10/11/2012    Procedure: TENNIS ELBOW RELEASE;  Surgeon: [name], MD;  Location: [place];  Service: Orthopedics;  Laterality: Right;  RIGHT ELBOW: TENOTOMY ELBOW LATERAL EPICONDYLITIS TENNIS ELBOW: RADIAL TUNNEL RELEASE   Family History  Problem Relation Age of Onset  . Heart attack Mother   . Hypertension Sister   . Hypertension Brother    History  Substance Use Topics  . Smoking status: Never Smoker   . Smokeless tobacco: Never Used  . Alcohol Use: No   OB History    No data available     Review of Systems  Constitutional: Negative for fever, chills and fatigue.  HENT: Negative for trouble swallowing.   Eyes: Negative for visual disturbance.  Respiratory: Positive for shortness of breath.   Cardiovascular: Positive for chest pain. Negative for palpitations.  Gastrointestinal: Negative for nausea, vomiting, abdominal pain and diarrhea.  Genitourinary: Negative for dysuria and difficulty urinating.  Musculoskeletal: Negative for arthralgias and neck pain.  Skin: Negative for color change.  Neurological: Negative for dizziness and weakness.  Psychiatric/Behavioral: Negative for dysphoric mood.      Allergies  Avelox; Bactrim; Clindamycin/lincomycin; and Kiwi extract  Home Medications   Prior to Admission medications  Medication Sig Start Date End Date Taking? Authorizing Provider  aspirin EC 81 MG tablet Take two tablets by mouth once daily. 11/22/11  Yes [name], MD  benazepril (LOTENSIN) 20 MG tablet Take 1 tablet (20 mg total) by mouth daily. 09/17/14  Yes Carlena Bjornstad, MD  carvedilol (COREG) 12.5 MG tablet TAKE ONE TABLET BY MOUTH TWICE DAILY WITH  A  MEAL.  THIS  IS  A  DECREASE  IN  DOSE. 11/18/14  Yes Carlena Bjornstad, MD   fexofenadine-pseudoephedrine (ALLEGRA-D) 60-120 MG per tablet Take 1 tablet by mouth daily as needed. For allergies   Yes Historical Provider, MD  ibuprofen (ADVIL,MOTRIN) 800 MG tablet Take 800 mg by mouth every 6 (six) hours as needed for moderate pain.  08/22/14  Yes Historical Provider, MD  levothyroxine (SYNTHROID, LEVOTHROID) 88 MCG tablet Take 88 mcg by mouth daily before breakfast.   Yes Historical Provider, MD  lubiprostone (AMITIZA) 8 MCG capsule Take 8 mcg by mouth 2 (two) times daily with a meal.   Yes Historical Provider, MD  omeprazole (PRILOSEC) 20 MG capsule Take 20 mg by mouth daily.   Yes Historical Provider, MD  torsemide (DEMADEX) 10 MG tablet Take 1 tablet (10 mg total) by mouth as needed. Up to two times per week Patient taking differently: Take 10 mg by mouth daily as needed. Up to two times per week 12/09/14  Yes [name], MD  isosorbide-hydrALAZINE (BIDIL) 20-37.5 MG per tablet Take 1 tablet by mouth 2 (two) times daily. Patient not taking: Reported on 12/12/2014 12/09/14   [name], MD   BP 140/92 mmHg  Pulse 70  Temp(Src) 98.2 F (36.8 C)  Resp 18  Ht 5' 7.5" (1.715 m)  Wt 206 lb (93.441 kg)  BMI 31.77 kg/m2  SpO2 100% Physical Exam  Constitutional: She is oriented to person, place, and time. She appears well-developed and well-nourished. No distress.  HENT:  Head: Normocephalic and atraumatic.  Eyes: Conjunctivae and EOM are normal.  Neck: Normal range of motion.  Cardiovascular: Normal rate and regular rhythm.  Exam reveals no gallop and no friction rub.   No murmur heard. Pulmonary/Chest: Effort normal and breath sounds normal. She has no wheezes. She has no rales. She exhibits no tenderness.  Abdominal: Soft. There is no tenderness.  Musculoskeletal: Normal range of motion.  No lower extremity swelling or calf tenderness to palpation.   Neurological: She is alert and oriented to person, place, and time. Coordination normal.  Speech is  goal-oriented. Moves limbs without ataxia.   Skin: Skin is warm and dry.  Psychiatric: She has a normal mood and affect. Her behavior is normal.  Nursing note and vitals reviewed.   ED Course  Procedures (including critical care time) Labs Review Labs Reviewed  BASIC METABOLIC PANEL - Abnormal; Notable for the following:    GFR calc non Af Amer 68 (*)    GFR calc Af Amer 78 (*)    All other components within normal limits  D-DIMER, QUANTITATIVE - Abnormal; Notable for the following:    D-Dimer, Quant 1.68 (*)    All other components within normal limits  CBC  TROPONIN I  BRAIN NATRIURETIC PEPTIDE  I-STAT TROPOININ, ED    Imaging Review Dg Chest 2 View  12/12/2014   CLINICAL DATA:  The pt is c/o generalized chest pain since yesterday. No cough no cold . She just feels tired. Her chest pain Is better when she lies down. Pt  has hx of cardiomyopathy, GERD, bradycardia, TIA, ICD. Non-Smoker.  EXAM: CHEST  2 VIEW  COMPARISON:  01/12/2008  FINDINGS: Cardiac silhouette mildly enlarged. Normal mediastinal and hilar contours. Left anterior chest wall AICD is stable and well positioned.  Clear lungs.  No pleural effusion or pneumothorax.  Surgical vascular clips at the neck base are stable consistent with previous thyroid surgery.  Bony thorax is intact.  IMPRESSION: No acute cardiopulmonary disease.   Electronically Signed   By: Lajean Manes M.D.   On: 12/12/2014 20:16   Ct Angio Chest Pe W/cm &/or Wo Cm  12/13/2014   CLINICAL DATA:  Sob x 2 days getting worse. Chest discomfort x 2 days, chest pain worse today. Pt states gets very tired on exertion.  EXAM: CT ANGIOGRAPHY CHEST WITH CONTRAST  TECHNIQUE: Multidetector CT imaging of the chest was performed using the standard protocol during bolus administration of intravenous contrast. Multiplanar CT image reconstructions and MIPs were obtained to evaluate the vascular anatomy.  CONTRAST:  167mL OMNIPAQUE IOHEXOL 350 MG/ML SOLN  COMPARISON:  Current  chest radiograph.  FINDINGS: Angiographic study: No evidence of a pulmonary embolus. Great vessels normal in caliber. No aortic dissection. No atherosclerotic plaque evident.  Mediastinum: Heart mildly enlarged. No mediastinal or hilar masses or pathologically enlarged lymph nodes. Surgical clips along the margins of the thyroid gland. No neck base or axillary masses or adenopathy.  Lungs and pleural: No lung consolidation or edema. No lung masses or suspicious nodules. No pleural effusion or pneumothorax.  Limited upper abdomen:  Unremarkable.  Musculoskeletal:  Unremarkable.  Left anterior chest wall AICD has its leads well-positioned in the right atrium and right ventricle.  Review of the MIP images confirms the above findings.  IMPRESSION: 1. No evidence of a pulmonary embolism. 2. No acute findings. 3. Mild cardiomegaly.   Electronically Signed   By: Lajean Manes M.D.   On: 12/13/2014 00:07     EKG Interpretation   Date/Time:  Friday December 12 2014 17:52:21 EST Ventricular Rate:  72 PR Interval:  162 QRS Duration: 86 QT Interval:  398 QTC Calculation: 435 R Axis:   55 Text Interpretation:  Normal sinus rhythm Normal ECG No significant change  since last tracing Confirmed by Debby Freiberg [phone]) on 12/12/2014  9:52:50 PM      MDM   Final diagnoses:  SOB (shortness of breath)  Chest pain, unspecified chest pain type    9:27 PM Labs and troponin unremarkable for acute changes. Vitals stable and patient afebrile. D-dimer pending.   1:05 AM Patient's d-dimer elevated. CT angio shows no acute PE or other acute changes. Patient will be discharged with recommended PCP follow up or return to the ED with worsening or concerning symptoms. No further evaluation needed at this time. Patient reports she has been doing heavy lifting lately. Patient will have Robaxin prescription.    [address] [place], PA-C 12/13/14 0109  Debby Freiberg, MD 12/14/14 [phone]

## 2014-12-12 NOTE — ED Notes (Signed)
Pt c/o generalized chest pain starting yesterday associated with increased shortness of breath. Also reports increased pain to L side and weakness

## 2014-12-12 NOTE — ED Notes (Signed)
The pt is c/o generalized chest pain since yesterday.  No cough no cold .  She just feels tired.  Her chest pain  Is better when she lies down.

## 2014-12-12 NOTE — Telephone Encounter (Signed)
New message      Pt c/o medication issue:  1. Name of Medication: fluid pill  2. How are you currently taking this medication (dosage and times per day)? 1/2 daily.  Pt did not take pill today 3. Are you having a reaction (difficulty breathing--STAT)? no  4. What is your medication issue? Pt is very weak like she is going to pass out.  Could this be the medication?

## 2014-12-12 NOTE — Telephone Encounter (Signed)
Patient has not felt right since starting this medication.  She was unable to do anything yesterday, when she took Torsemide, she claims she was too weak to even walk to her car. Will review with Dr. [name] and call her back.  She is agreeable.

## 2014-12-13 MED ORDER — METHOCARBAMOL 500 MG PO TABS
500.0000 mg | ORAL_TABLET | Freq: Once | ORAL | Status: AC
  Administered 2014-12-13: 500 mg via ORAL
  Filled 2014-12-13: qty 1

## 2014-12-13 MED ORDER — METHOCARBAMOL 500 MG PO TABS: 500.0000 mg | ORAL_TABLET | Freq: Two times a day (BID) | ORAL | Status: DC

## 2014-12-13 NOTE — Discharge Instructions (Signed)
Take Robaxin as needed for chest pain. Refer to attached documents for more information. Return to the ED with worsening or concerning symptoms.

## 2014-12-13 NOTE — ED Notes (Signed)
Pt requesting to take robaxin prior to discharge; sts "I want to make sure I can take it because I'm sensitive to a lot of medications and get tired of buying medications that I can't take"

## 2014-12-16 ENCOUNTER — Telehealth: Payer: Self-pay | Admitting: Internal Medicine

## 2014-12-16 NOTE — Telephone Encounter (Signed)
New Message  Dawn from [place] called to sched f/u w/ [name] due to Dr. Ronnald Ramp dx - viral Cardiomyopathy. Offered 3/31 but she said she needed sooner appt. Please call back and discuss.

## 2014-12-16 NOTE — Telephone Encounter (Signed)
[facility] faxing office notes.  Explained that I would have EP physician review information and let them know about follow up.

## 2014-12-17 NOTE — Telephone Encounter (Signed)
Reviewed with flex PA in office today, Bonney Leitz, whom reviewed faxed paperwork, along with recent ED visit and Dr. Olin Pia last office note.  She states we can review this with Dr. [name] next week when he returns, patient does not need to be seen this week. Spoke with Dawn at [facility] and informed her that we would have Dr. [name] review this next week to see what his recommendations are. She verbalized understanding and will pass along to patient this information along with going to the hospital if symptoms worsen. She will also tell patient (whom is standing in front of her as we speak) that our office will contact her once reviewed by Dr. [name].

## 2014-12-23 ENCOUNTER — Other Ambulatory Visit (INDEPENDENT_AMBULATORY_CARE_PROVIDER_SITE_OTHER): Payer: BLUE CROSS/BLUE SHIELD | Admitting: *Deleted

## 2014-12-23 DIAGNOSIS — Z01812 Encounter for preprocedural laboratory examination: Secondary | ICD-10-CM

## 2014-12-23 DIAGNOSIS — I428 Other cardiomyopathies: Secondary | ICD-10-CM

## 2014-12-23 DIAGNOSIS — I429 Cardiomyopathy, unspecified: Secondary | ICD-10-CM

## 2014-12-23 DIAGNOSIS — Z4502 Encounter for adjustment and management of automatic implantable cardiac defibrillator: Secondary | ICD-10-CM

## 2014-12-23 LAB — CBC WITH DIFFERENTIAL/PLATELET
BASOS ABS: 0.1 10*3/uL (ref 0.0–0.1)
Basophils Relative: 1.4 % (ref 0.0–3.0)
EOS ABS: 0.3 10*3/uL (ref 0.0–0.7)
Eosinophils Relative: 7.7 % — ABNORMAL HIGH (ref 0.0–5.0)
HCT: 35.7 % — ABNORMAL LOW (ref 36.0–46.0)
Hemoglobin: 12 g/dL (ref 12.0–15.0)
Lymphocytes Relative: 40.3 % (ref 12.0–46.0)
Lymphs Abs: 1.8 10*3/uL (ref 0.7–4.0)
MCHC: 33.6 g/dL (ref 30.0–36.0)
MCV: 88.5 fl (ref 78.0–100.0)
Monocytes Absolute: 0.3 10*3/uL (ref 0.1–1.0)
Monocytes Relative: 6.6 % (ref 3.0–12.0)
NEUTROS ABS: 2 10*3/uL (ref 1.4–7.7)
NEUTROS PCT: 44 % (ref 43.0–77.0)
Platelets: 192 10*3/uL (ref 150.0–400.0)
RBC: 4.04 Mil/uL (ref 3.87–5.11)
RDW: 12.9 % (ref 11.5–15.5)
WBC: 4.5 10*3/uL (ref 4.0–10.5)

## 2014-12-23 LAB — BASIC METABOLIC PANEL
BUN: 13 mg/dL (ref 6–23)
CO2: 32 mEq/L (ref 19–32)
Calcium: 9.4 mg/dL (ref 8.4–10.5)
Chloride: 107 mEq/L (ref 96–112)
Creatinine, Ser: 0.93 mg/dL (ref 0.40–1.20)
GFR: 80.6 mL/min (ref >60.00)
Glucose, Bld: 91 mg/dL (ref 70–99)
POTASSIUM: 4.2 meq/L (ref 3.5–5.1)
Sodium: 140 mEq/L (ref 135–145)

## 2014-12-24 NOTE — Telephone Encounter (Signed)
Spoke with patient who states she is much better now.  She states chest is a lot better and SOB greatly improved, she has even been walking.  She thinks most of this was related to GERD and not having/taking medication for this. She also states that once doctor told her week before last that she may a virus leading her to feel the way she was feeling. I cancelled appt to see a PA in our office with recommendations to call us back if symptoms resume before surgery next week. Patient verbalized understanding and agreeable to plan.

## 2014-12-25 ENCOUNTER — Ambulatory Visit: Payer: BLUE CROSS/BLUE SHIELD | Admitting: Cardiology

## 2014-12-28 DIAGNOSIS — E785 Hyperlipidemia, unspecified: Secondary | ICD-10-CM | POA: Diagnosis not present

## 2014-12-28 DIAGNOSIS — K219 Gastro-esophageal reflux disease without esophagitis: Secondary | ICD-10-CM | POA: Diagnosis not present

## 2014-12-28 DIAGNOSIS — Z9851 Tubal ligation status: Secondary | ICD-10-CM | POA: Diagnosis not present

## 2014-12-28 DIAGNOSIS — I429 Cardiomyopathy, unspecified: Secondary | ICD-10-CM | POA: Diagnosis present

## 2014-12-28 DIAGNOSIS — I509 Heart failure, unspecified: Secondary | ICD-10-CM | POA: Diagnosis not present

## 2014-12-28 DIAGNOSIS — Z882 Allergy status to sulfonamides status: Secondary | ICD-10-CM | POA: Diagnosis not present

## 2014-12-28 DIAGNOSIS — Z8673 Personal history of transient ischemic attack (TIA), and cerebral infarction without residual deficits: Secondary | ICD-10-CM | POA: Diagnosis not present

## 2014-12-28 DIAGNOSIS — Z7982 Long term (current) use of aspirin: Secondary | ICD-10-CM | POA: Diagnosis not present

## 2014-12-28 DIAGNOSIS — Z91018 Allergy to other foods: Secondary | ICD-10-CM | POA: Diagnosis not present

## 2014-12-28 DIAGNOSIS — Z881 Allergy status to other antibiotic agents status: Secondary | ICD-10-CM | POA: Diagnosis not present

## 2014-12-28 DIAGNOSIS — E039 Hypothyroidism, unspecified: Secondary | ICD-10-CM | POA: Diagnosis not present

## 2014-12-28 MED ORDER — CEFAZOLIN SODIUM-DEXTROSE 2-3 GM-% IV SOLR
2.0000 g | INTRAVENOUS | Status: DC
Start: 2014-12-28 — End: 2014-12-29

## 2014-12-28 MED ORDER — SODIUM CHLORIDE 0.9 % IR SOLN
80.0000 mg | Status: DC
  Filled 2014-12-28: qty 2

## 2014-12-29 ENCOUNTER — Encounter (HOSPITAL_COMMUNITY): Payer: Self-pay | Admitting: Internal Medicine

## 2014-12-29 ENCOUNTER — Ambulatory Visit (HOSPITAL_COMMUNITY)
Admission: RE | Admit: 2014-12-29 | Discharge: 2014-12-29 | Disposition: A | Discharge status: Home / Self Care | Payer: BLUE CROSS/BLUE SHIELD | Source: Ambulatory Visit | Attending: Internal Medicine | Admitting: Internal Medicine

## 2014-12-29 ENCOUNTER — Encounter (HOSPITAL_COMMUNITY)
Admission: RE | Disposition: A | Discharge status: Home / Self Care | Payer: Self-pay | Source: Ambulatory Visit | Attending: Internal Medicine

## 2014-12-29 DIAGNOSIS — E039 Hypothyroidism, unspecified: Secondary | ICD-10-CM | POA: Insufficient documentation

## 2014-12-29 DIAGNOSIS — I509 Heart failure, unspecified: Secondary | ICD-10-CM | POA: Insufficient documentation

## 2014-12-29 DIAGNOSIS — Z4502 Encounter for adjustment and management of automatic implantable cardiac defibrillator: Secondary | ICD-10-CM | POA: Diagnosis not present

## 2014-12-29 DIAGNOSIS — T82190A Other mechanical complication of cardiac electrode, initial encounter: Secondary | ICD-10-CM | POA: Diagnosis not present

## 2014-12-29 DIAGNOSIS — Z95810 Presence of automatic (implantable) cardiac defibrillator: Secondary | ICD-10-CM | POA: Diagnosis present

## 2014-12-29 DIAGNOSIS — I429 Cardiomyopathy, unspecified: Secondary | ICD-10-CM | POA: Diagnosis not present

## 2014-12-29 DIAGNOSIS — Z9851 Tubal ligation status: Secondary | ICD-10-CM | POA: Insufficient documentation

## 2014-12-29 DIAGNOSIS — Z7982 Long term (current) use of aspirin: Secondary | ICD-10-CM | POA: Insufficient documentation

## 2014-12-29 DIAGNOSIS — E785 Hyperlipidemia, unspecified: Secondary | ICD-10-CM | POA: Insufficient documentation

## 2014-12-29 DIAGNOSIS — K219 Gastro-esophageal reflux disease without esophagitis: Secondary | ICD-10-CM | POA: Insufficient documentation

## 2014-12-29 DIAGNOSIS — Z881 Allergy status to other antibiotic agents status: Secondary | ICD-10-CM | POA: Insufficient documentation

## 2014-12-29 DIAGNOSIS — Z882 Allergy status to sulfonamides status: Secondary | ICD-10-CM | POA: Insufficient documentation

## 2014-12-29 DIAGNOSIS — Z8673 Personal history of transient ischemic attack (TIA), and cerebral infarction without residual deficits: Secondary | ICD-10-CM | POA: Insufficient documentation

## 2014-12-29 DIAGNOSIS — I5022 Chronic systolic (congestive) heart failure: Secondary | ICD-10-CM

## 2014-12-29 DIAGNOSIS — I428 Other cardiomyopathies: Secondary | ICD-10-CM

## 2014-12-29 DIAGNOSIS — Z91018 Allergy to other foods: Secondary | ICD-10-CM | POA: Insufficient documentation

## 2014-12-29 HISTORY — PX: IMPLANTABLE CARDIOVERTER DEFIBRILLATOR GENERATOR CHANGE: SHX5474

## 2014-12-29 LAB — SURGICAL PCR SCREEN
MRSA, PCR: NEGATIVE
Staphylococcus aureus: NEGATIVE

## 2014-12-29 SURGERY — IMPLANTABLE CARDIOVERTER DEFIBRILLATOR GENERATOR CHANGE
Anesthesia: LOCAL

## 2014-12-29 MED ORDER — SODIUM CHLORIDE 0.9 % IV SOLN: INTRAVENOUS | Status: AC

## 2014-12-29 MED ORDER — FENTANYL CITRATE 0.05 MG/ML IJ SOLN
INTRAMUSCULAR | Status: AC
  Filled 2014-12-29: qty 2

## 2014-12-29 MED ORDER — MUPIROCIN 2 % EX OINT
1.0000 "application " | TOPICAL_OINTMENT | Freq: Once | CUTANEOUS | Status: AC
  Administered 2014-12-29: 1 via TOPICAL

## 2014-12-29 MED ORDER — CEFAZOLIN SODIUM-DEXTROSE 2-3 GM-% IV SOLR
INTRAVENOUS | Status: AC
  Filled 2014-12-29: qty 50

## 2014-12-29 MED ORDER — SODIUM CHLORIDE 0.9 % IV SOLN
INTRAVENOUS | Status: DC
  Administered 2014-12-29: 08:00:00 via INTRAVENOUS

## 2014-12-29 MED ORDER — ONDANSETRON HCL 4 MG/2ML IJ SOLN: 4.0000 mg | Freq: Four times a day (QID) | INTRAMUSCULAR | Status: DC | PRN

## 2014-12-29 MED ORDER — MIDAZOLAM HCL 5 MG/5ML IJ SOLN
INTRAMUSCULAR | Status: AC
  Filled 2014-12-29: qty 5

## 2014-12-29 MED ORDER — MUPIROCIN 2 % EX OINT
TOPICAL_OINTMENT | CUTANEOUS | Status: AC
  Administered 2014-12-29: 1 via TOPICAL
  Filled 2014-12-29: qty 22

## 2014-12-29 MED ORDER — ACETAMINOPHEN 325 MG PO TABS
325.0000 mg | ORAL_TABLET | ORAL | Status: DC | PRN
  Filled 2014-12-29: qty 2

## 2014-12-29 MED ORDER — LIDOCAINE HCL (PF) 1 % IJ SOLN
INTRAMUSCULAR | Status: AC
  Filled 2014-12-29: qty 60

## 2014-12-29 NOTE — H&P (View-Only) (Signed)
Patient Care Team: Terald Sleeper, PA-C as PCP - General (General Practice)   HPI  [name] [name] is a 54 y.o. female seen in followup for her nonischemic cardiomyopathy and bradycardia status post ICD implantation for primary prevention.  Her device has reached ERI.   The patient is seen for followup of cardiomyopathy. The cardiac viewpoint she is doing well. She is taking appropriate medications. She has only mild shortness of breath. No chest pain.  Echocardiogram 8/14 demonstrated ejection fraction somewhat improved at 45%  But JK overread suggests really 30%  Repeat last month was 35-40%  She comes in with many questions and also complaints of abd fullness    Past Medical History  Diagnosis Date  . Nonischemic cardiomyopathy     Etiology unknown, diagnosed 2008, patient has never had catheterization or nuclear study as of March, 2011  . Bradycardia   . GERD (gastroesophageal reflux disease)   . Hypothyroidism   . Dyslipidemia     LDL elevation,Patient does not want statin  . TIA (transient ischemic attack)     No CT or MRI abnormality, aspirin therapy  . Weight gain   . Leg swelling     right leg  . Pain     left leg & left knee  . Cough due to angiotensin-converting enzyme inhibitor     But patient chooses to continue ACE  . ICD (implantable cardiac defibrillator) battery depletion     Dr. [name], June, 2009(artifactual atrial tachycardia response events addressed by reprogramming in parentheses  . Dizziness     Dizziness with standing after squatting, March, 2012  . Arthritis   . Itching   . Ejection fraction < 50%     Past Surgical History  Procedure Laterality Date  . Cardiac defibrillator placement      [facility] no remote  . Tubal ligation  94  . Upper gi endoscopy    . Colonoscopy    . Total thyroidectomy  2001  . Cyst excision      back of leg-lt   . Lateral epicondyle release  10/11/2012    Procedure: TENNIS ELBOW RELEASE;  Surgeon:  [name], MD;  Location: [place];  Service: Orthopedics;  Laterality: Right;  RIGHT ELBOW: TENOTOMY ELBOW LATERAL EPICONDYLITIS TENNIS ELBOW: RADIAL TUNNEL RELEASE    Current Outpatient Prescriptions  Medication Sig Dispense Refill  . aspirin EC 81 MG tablet Take two tablets by mouth once daily.    . benazepril (LOTENSIN) 20 MG tablet Take 1 tablet (20 mg total) by mouth daily. 90 tablet 6  . carvedilol (COREG) 12.5 MG tablet TAKE ONE TABLET BY MOUTH TWICE DAILY WITH  A  MEAL.  THIS  IS  A  DECREASE  IN  DOSE. 180 tablet 0  . fexofenadine-pseudoephedrine (ALLEGRA-D) 60-120 MG per tablet Take 1 tablet by mouth as needed.    [name] ibuprofen (ADVIL,MOTRIN) 800 MG tablet as needed.    [name] levothyroxine (SYNTHROID, LEVOTHROID) 88 MCG tablet Take 88 mcg by mouth daily before breakfast.    . omeprazole (PRILOSEC) 20 MG capsule Take 20 mg by mouth daily.     No current facility-administered medications for this visit.    Allergies  Allergen Reactions  . Avelox [Moxifloxacin Hcl In Nacl]     Breathing problems  . Bactrim [Sulfamethoxazole-Trimethoprim] Swelling  . Clindamycin/Lincomycin     Breathing/diarrhea   . Kiwi Extract     Makes pt. Feel like her throat  is closing    Review of Systems negative except from HPI and PMH  Physical Exam BP 126/82 mmHg  Pulse 86  Ht 5\' 8"  (1.727 m)  Wt 212 lb 3.2 oz (96.253 kg)  BMI 32.27 kg/m2 Well developed and well nourished in no acute distress HENT normal E scleral and icterus clear Neck Supple JVP 8  carotids brisk and full Clear to ausculation Device pocket well healed; without hematoma or erythema.  There is no tethering Regular rate and rhythm, no murmurs gallops or rub Soft with active bowel sounds No clubbing cyanosis none Edema Alert and oriented, grossly normal motor and sensory function Skin Warm and Dry   ECG sinus with normal intervals  Assessment and  Plan  NICM iterval improvement  CHF chronic  systolic-volume overloaded  ICD Medtronic  The patient's device was interrogated and the information was fully reviewed.  The device was reprogrammed to  prolong detection intervals   We discussed appropriate use criteria as relates to device generator replacement in a patient with an incompletely  resolved nonischemic cardiomyopathy.   We will begin  hydralazine nitrates.   She is volume overloaded and will begin torsemide 10 mg qod x 1 week then prn 2x week  We have reviewed the benefits and risks of generator replacement.  These include but are not limited to lead fracture and infection.  The patient understands, agrees and is willing to proceed.

## 2014-12-29 NOTE — Discharge Instructions (Signed)
Pacemaker Battery Change, Care After °Refer to this sheet in the next few weeks. These instructions provide you with information on caring for yourself after your procedure. Your health care provider may also give you more specific instructions. Your treatment has been planned according to current medical practices, but problems sometimes occur. Call your health care provider if you have any problems or questions after your procedure. °WHAT TO EXPECT AFTER THE PROCEDURE °After your procedure, it is typical to have the following sensations: °· Soreness at the pacemaker site. °HOME CARE INSTRUCTIONS  °· Keep the incision clean and dry. °· Unless advised otherwise, you may shower beginning 48 hours after your procedure. °· For the first week after the replacement, avoid stretching motions that pull at the incision site, and avoid heavy exercise with the arm that is on the same side as the incision. °· Take medicines only as directed by your health care provider. °· Keep all follow-up visits as directed by your health care provider. °SEEK MEDICAL CARE IF:  °· You have pain at the incision site that is not relieved by over-the-counter or prescription medicine. °· There is drainage or pus from the incision site. °· There is swelling larger than a lime at the incision site. °· You develop red streaking that extends above or below the incision site. °· You feel brief, intermittent palpitations, light-headedness, or any symptoms that you feel might be related to your heart. °SEEK IMMEDIATE MEDICAL CARE IF:  °· You experience chest pain that is different than the pain at the pacemaker site. °· You experience shortness of breath. °· You have palpitations or irregular heartbeat. °· You have light-headedness that does not go away quickly. °· You faint. °· You have pain that gets worse and is not relieved by medicine. °Document Released: 07/31/2013 Document Revised: 02/24/2014 Document Reviewed: 07/31/2013 °ExitCare® Patient  Information ©2015 [employer]. This information is not intended to replace advice given to you by your health care provider. Make sure you discuss any questions you have with your health care provider. ° °

## 2014-12-29 NOTE — Interval H&P Note (Signed)
ICD Criteria  Current LVEF:35% ;Obtained > or = 1 month ago and < or = 3 months ago.  NYHA Functional Classification: Class II  Heart Failure History:  Yes, Duration of heart failure since onset is > 9 months  Non-Ischemic Dilated Cardiomyopathy History:  Yes, timeframe is > 9 months  Atrial Fibrillation/Atrial Flutter:  No.  Ventricular Tachycardia History:  No.  Cardiac Arrest History:  No  History of Syndromes with Risk of Sudden Death:  No.  Previous ICD:  Yes, ICD Type:  CRT-D, Reason for ICD:  Primary prevention.  LVEF is not available  Electrophysiology Study: No.  Prior MI: No.  PPM: No.  OSA:  No  Patient Life Expectancy of >=1 year: Yes.  Anticoagulation Therapy:  Patient is NOT on anticoagulation therapy.   Beta Blocker Therapy:  Yes.   Ace Inhibitor/ARB Therapy:  Yes.History and Physical Interval Note:  12/29/2014 9:04 AM  [name] [name]  has presented today for surgery, with the diagnosis of eri  The various methods of treatment have been discussed with the patient and family. After consideration of risks, benefits and other options for treatment, the patient has consented to  Procedure(s): IMPLANTABLE CARDIOVERTER DEFIBRILLATOR GENERATOR CHANGE (N/A) as a surgical intervention .  The patient's history has been reviewed, patient examined, no change in status, stable for surgery.  I have reviewed the patient's chart and labs.  Questions were answered to the patient's satisfaction.     [name] [name]

## 2014-12-29 NOTE — CV Procedure (Signed)
Preoperative diagnosis nonischemic cardiomyopathy Postoperative diagnosis same/ heme discoloration of A and V lead  Procedure: Generator replacement; Assessment of HV leads  Repair of both leads and pocket revision;   Following informed consent the patient was brought to the electrophysiology laboratory in place of the fluoroscopic table in the supine position after routine prep and drape lidocaine was infiltrated in the region of the previous incision and carried down to later the device pocket using sharp dissection and electrocautery. The pocket was opened the device was freed up and was explanted.  Interrogation of the previously implanted ICD ventricular lead [employer]  [phone]  demonstrated an R wave of 22  millivolts., and impedance of 592 ohms, and a pacing threshold of 0.8 volts at 0.5 msec.  The previously implanted atrial lead Medtronic [id number] demonstrated a P-wave amplitude of  0.9 milllivolts and impedance of  495 ohms, and a pacing threshold of  0.4 volts at @ 0.12milliseconds.  The leads were inspected. Repair was needed of both leads. The leads were then attached to a [employer] pulse generator, serial number [id number].    Through the device the P-wave amplitude  Was  1.0 milllivolts and impedance of  570 ohms, and a pacing threshold of  0.4 volts at @ 0.4milliseconds; the ICD ventricular lead demonstrated an R wave of  22  millivolts., and impedance of 443 ohms, and a pacing threshold of  0.7 volts at 0.4 msec  High voltage impedances were 48 ohms  The pocket was irrigated with antibiotic containing saline solution hemostasis was assured and the leads and the device were placed in the pocket. The wound was then closed in 2 layers in normal fashion.  The patient tolerated the procedure without apparent complication.  DFT testing was not performed  EBL Minimal   [name]   \

## 2015-01-08 ENCOUNTER — Ambulatory Visit: Payer: BLUE CROSS/BLUE SHIELD

## 2015-01-09 ENCOUNTER — Encounter: Payer: Self-pay | Admitting: *Deleted

## 2015-01-09 ENCOUNTER — Ambulatory Visit (INDEPENDENT_AMBULATORY_CARE_PROVIDER_SITE_OTHER): Payer: BLUE CROSS/BLUE SHIELD | Admitting: *Deleted

## 2015-01-09 DIAGNOSIS — Z95810 Presence of automatic (implantable) cardiac defibrillator: Secondary | ICD-10-CM

## 2015-01-09 DIAGNOSIS — I5022 Chronic systolic (congestive) heart failure: Secondary | ICD-10-CM

## 2015-01-09 DIAGNOSIS — I428 Other cardiomyopathies: Secondary | ICD-10-CM

## 2015-01-09 DIAGNOSIS — I429 Cardiomyopathy, unspecified: Secondary | ICD-10-CM

## 2015-01-09 LAB — MDC_IDC_ENUM_SESS_TYPE_INCLINIC
Brady Statistic RA Percent Paced: 1 % — CL
Brady Statistic RV Percent Paced: 1 % — CL
HighPow Impedance: 50 Ohm
Implantable Pulse Generator Serial Number: 111826
Lead Channel Pacing Threshold Amplitude: 0.4 V
Lead Channel Pacing Threshold Amplitude: 0.7 V
Lead Channel Pacing Threshold Pulse Width: 0.4 ms
Lead Channel Sensing Intrinsic Amplitude: 0.5 mV
Lead Channel Sensing Intrinsic Amplitude: 21.2 mV
Lead Channel Setting Pacing Amplitude: 2 V
Lead Channel Setting Pacing Pulse Width: 0.4 ms
MDC IDC MSMT LEADCHNL RA IMPEDANCE VALUE: 540 Ohm
MDC IDC MSMT LEADCHNL RV IMPEDANCE VALUE: 449 Ohm
MDC IDC MSMT LEADCHNL RV PACING THRESHOLD PULSEWIDTH: 0.4 ms
MDC IDC SESS DTM: 20160318040000
MDC IDC SET LEADCHNL RV PACING AMPLITUDE: 2.5 V
MDC IDC SET LEADCHNL RV SENSING SENSITIVITY: 0.6 mV
MDC IDC SET ZONE DETECTION INTERVAL: 286 ms
Zone Setting Detection Interval: 250 ms
Zone Setting Detection Interval: 333 ms

## 2015-01-09 NOTE — Progress Notes (Signed)
Wound check appointment for changeout. No steri-strips, dermabond present. Wound without redness or edema. Incision edges approximated, wound well healed. Normal device function. Thresholds, sensing, and impedances consistent with implant measurements. Device programmed at chronic lead settings. Histogram distribution appropriate for patient and level of activity. No mode switches or ventricular arrhythmias noted. Patient educated about wound care, arm mobility, lifting restrictions, shock plan. Latitude 04/13/15 & ROV w/ Dr. [name] in 42mo.

## 2015-01-14 ENCOUNTER — Telehealth: Payer: Self-pay | Admitting: Internal Medicine

## 2015-01-14 NOTE — Telephone Encounter (Signed)
Got CPT codes from billing department - [phone], [phone], [phone] Informed Candice at [facility] of codes and ICD Gen change procedure.  She inquired at to if patient was able to return to work. I informed her that we could not answer that without knowing job title and what she did. Explained typically we will fill this out with requested FMLA paperwork.  She is going to fax [place] requested information.

## 2015-01-14 NOTE — Telephone Encounter (Signed)
New message         Disability Office calling to get CPT code and name of procedure pt had at the hospital on 12/29/14   Reference number [id number]

## 2015-01-22 ENCOUNTER — Encounter: Payer: Self-pay | Admitting: Internal Medicine

## 2015-02-02 ENCOUNTER — Telehealth: Payer: Self-pay | Admitting: Cardiology

## 2015-02-02 NOTE — Telephone Encounter (Signed)
Spoke with Patient earlier regarding her complaint of abdominal swelling without associated weight gain. States she also has sharp intermittent pains to right lower abdomen. She also reports feeling "bubbles" in her stomach/abdomen area. She denies CP, SOB, other cardiac sx. She takes diuretic but states she doesn't really drink very much fluid so therefore she doesn't have a lot of UOP. Advised patient to call PCP regarding the abdominal pain and swelling and asked to be seen today. Patient did call back to let us know that she does have an appointment with her PCP today.

## 2015-02-02 NOTE — Telephone Encounter (Signed)
Spoke with patient who states she spoke with Nevin Bloodgood, RN who advised her to see PCP today.  Patient states she will be seen by PCP today and to please make [facility] aware.

## 2015-02-02 NOTE — Telephone Encounter (Signed)
New message    Patient calling stating her off from work is today and tomorrow.     Pt C/O swelling: STAT is pt has developed SOB within 24 hours  1. How long have you been experiencing swelling? 2 weeks now . S/p generate change on  3.7.2016    2. Where is the swelling located? Abdomen   3.  Are you currently taking a "fluid pill"? Yes . Not really going to bathroom    4.  Are you currently SOB? Yes   5.  Have you traveled recently? No

## 2015-03-03 ENCOUNTER — Other Ambulatory Visit: Payer: Self-pay | Admitting: Cardiology

## 2015-03-03 ENCOUNTER — Telehealth: Payer: Self-pay | Admitting: Internal Medicine

## 2015-03-03 NOTE — Telephone Encounter (Signed)
New message       Pt received her interrigator today by mail.  She does not have a landline phone.  Please call

## 2015-03-04 NOTE — Telephone Encounter (Signed)
Informed pt that I would file paper work so she can received cell adapter. Pt verbalized understanding.

## 2015-04-03 ENCOUNTER — Telehealth: Payer: Self-pay | Admitting: Cardiology

## 2015-04-03 NOTE — Telephone Encounter (Signed)
error 

## 2015-04-09 ENCOUNTER — Telehealth: Payer: Self-pay | Admitting: Internal Medicine

## 2015-04-09 NOTE — Telephone Encounter (Signed)
Spoke w/ pt informed her that I spoke w/ [facility] [employer] and they informed me she would have cell adapter in 2 weeks. Rescheduled 6-20 remote transmission until 7-11. Pt agreed w/ this plan and verbalized understanding.

## 2015-04-09 NOTE — Telephone Encounter (Signed)
New problem   Pt is scheduled for a remote check 6.20.16 and stated she haven't received the rest of her equipment. Please call pt.

## 2015-05-04 ENCOUNTER — Ambulatory Visit (INDEPENDENT_AMBULATORY_CARE_PROVIDER_SITE_OTHER): Payer: BLUE CROSS/BLUE SHIELD | Admitting: *Deleted

## 2015-05-04 DIAGNOSIS — I429 Cardiomyopathy, unspecified: Secondary | ICD-10-CM

## 2015-05-04 DIAGNOSIS — I428 Other cardiomyopathies: Secondary | ICD-10-CM

## 2015-05-05 NOTE — Progress Notes (Signed)
Remote ICD transmission.   

## 2015-05-06 LAB — CUP PACEART REMOTE DEVICE CHECK
Brady Statistic RA Percent Paced: 1 %
Date Time Interrogation Session: 20160711051900
HighPow Impedance: 48 Ohm
Lead Channel Sensing Intrinsic Amplitude: 1.5 mV
Lead Channel Sensing Intrinsic Amplitude: 19.4 mV
Lead Channel Setting Pacing Amplitude: 2 V
Lead Channel Setting Pacing Pulse Width: 0.4 ms
MDC IDC MSMT BATTERY REMAINING LONGEVITY: 126 mo
MDC IDC MSMT BATTERY REMAINING PERCENTAGE: 100 %
MDC IDC MSMT LEADCHNL RA IMPEDANCE VALUE: 544 Ohm
MDC IDC MSMT LEADCHNL RV IMPEDANCE VALUE: 403 Ohm
MDC IDC PG SERIAL: 111826
MDC IDC SET LEADCHNL RV PACING AMPLITUDE: 2.5 V
MDC IDC SET LEADCHNL RV SENSING SENSITIVITY: 0.6 mV
MDC IDC SET ZONE DETECTION INTERVAL: 286 ms
MDC IDC SET ZONE DETECTION INTERVAL: 333 ms
MDC IDC STAT BRADY RV PERCENT PACED: 0 %
Zone Setting Detection Interval: 250 ms

## 2015-05-29 ENCOUNTER — Encounter: Payer: Self-pay | Admitting: Cardiology

## 2015-06-05 ENCOUNTER — Encounter: Payer: Self-pay | Admitting: Internal Medicine

## 2015-06-16 ENCOUNTER — Encounter: Payer: Self-pay | Admitting: Cardiology

## 2015-06-22 ENCOUNTER — Other Ambulatory Visit: Payer: Self-pay | Admitting: Cardiology

## 2015-07-22 ENCOUNTER — Other Ambulatory Visit: Payer: Self-pay | Admitting: Cardiology

## 2015-07-23 ENCOUNTER — Other Ambulatory Visit: Payer: Self-pay | Admitting: Cardiology

## 2015-07-24 ENCOUNTER — Ambulatory Visit: Payer: BLUE CROSS/BLUE SHIELD | Admitting: Physician Assistant

## 2015-08-03 ENCOUNTER — Ambulatory Visit (INDEPENDENT_AMBULATORY_CARE_PROVIDER_SITE_OTHER): Payer: BLUE CROSS/BLUE SHIELD | Admitting: *Deleted

## 2015-08-03 ENCOUNTER — Encounter: Payer: Self-pay | Admitting: Internal Medicine

## 2015-08-03 ENCOUNTER — Telehealth: Payer: Self-pay | Admitting: Cardiology

## 2015-08-03 DIAGNOSIS — I428 Other cardiomyopathies: Secondary | ICD-10-CM

## 2015-08-03 DIAGNOSIS — I429 Cardiomyopathy, unspecified: Secondary | ICD-10-CM | POA: Diagnosis not present

## 2015-08-03 NOTE — Telephone Encounter (Signed)
LMOVM reminding pt to send remote transmission.   

## 2015-08-04 NOTE — Progress Notes (Signed)
Remote ICD transmission.   

## 2015-08-07 LAB — CUP PACEART REMOTE DEVICE CHECK
Battery Remaining Longevity: 126 mo
Brady Statistic RA Percent Paced: 1 %
HighPow Impedance: 49 Ohm
Implantable Lead Implant Date: 20090320
Implantable Lead Implant Date: 20090320
Implantable Lead Location: 753859
Implantable Lead Model: 158
Lead Channel Impedance Value: 540 Ohm
Lead Channel Sensing Intrinsic Amplitude: 1.3 mV
Lead Channel Setting Pacing Amplitude: 2 V
Lead Channel Setting Sensing Sensitivity: 0.6 mV
MDC IDC LEAD LOCATION: 753860
MDC IDC LEAD SERIAL: 182504
MDC IDC MSMT BATTERY REMAINING PERCENTAGE: 100 %
MDC IDC MSMT LEADCHNL RV IMPEDANCE VALUE: 436 Ohm
MDC IDC MSMT LEADCHNL RV SENSING INTR AMPL: 18.8 mV
MDC IDC SESS DTM: 20161010205900
MDC IDC SET LEADCHNL RV PACING AMPLITUDE: 2.5 V
MDC IDC SET LEADCHNL RV PACING PULSEWIDTH: 0.4 ms
MDC IDC SET ZONE DETECTION INTERVAL: 250 ms
MDC IDC SET ZONE DETECTION INTERVAL: 333 ms
MDC IDC SET ZONE VENDOR TYPE: 771139
MDC IDC STAT BRADY RV PERCENT PACED: 0 %
Pulse Gen Serial Number: 111826
Zone Setting Detection Interval: 286 ms
Zone Setting Vendor Type Category: 771137
Zone Setting Vendor Type Category: 771138

## 2015-08-27 ENCOUNTER — Other Ambulatory Visit: Payer: Self-pay | Admitting: Cardiology

## 2015-09-16 ENCOUNTER — Encounter: Payer: Self-pay | Admitting: *Deleted

## 2015-09-28 ENCOUNTER — Other Ambulatory Visit: Payer: Self-pay | Admitting: Cardiology

## 2015-10-21 ENCOUNTER — Ambulatory Visit (INDEPENDENT_AMBULATORY_CARE_PROVIDER_SITE_OTHER): Payer: BLUE CROSS/BLUE SHIELD | Admitting: Cardiology

## 2015-10-21 ENCOUNTER — Encounter: Payer: Self-pay | Admitting: Cardiology

## 2015-10-21 VITALS — BP 118/80 | HR 68 | Ht 67.5 in | Wt 202.0 lb

## 2015-10-21 DIAGNOSIS — I429 Cardiomyopathy, unspecified: Secondary | ICD-10-CM | POA: Diagnosis not present

## 2015-10-21 DIAGNOSIS — I428 Other cardiomyopathies: Secondary | ICD-10-CM

## 2015-10-21 DIAGNOSIS — Z4502 Encounter for adjustment and management of automatic implantable cardiac defibrillator: Secondary | ICD-10-CM

## 2015-10-21 DIAGNOSIS — R001 Bradycardia, unspecified: Secondary | ICD-10-CM

## 2015-10-21 MED ORDER — CARVEDILOL 12.5 MG PO TABS: 12.5000 mg | ORAL_TABLET | Freq: Two times a day (BID) | ORAL | Status: DC

## 2015-10-21 MED ORDER — BENAZEPRIL HCL 20 MG PO TABS
20.0000 mg | ORAL_TABLET | Freq: Every day | ORAL | Status: DC
Start: 2015-10-21 — End: 2016-01-14

## 2015-10-21 NOTE — Patient Instructions (Signed)
Medication Instructions:  Your physician recommends that you continue on your current medications as directed. Please refer to the Current Medication list given to you today.   Labwork: -None  Testing/Procedures: Your physician has requested that you have an echocardiogram. Echocardiography is a painless test that uses sound waves to create images of your heart. It provides your doctor with information about the size and shape of your heart and how well your heart's chambers and valves are working. This procedure takes approximately one hour. There are no restrictions for this procedure.    Follow-Up: Your physician recommends that you keep your scheduled  follow-up appointment with Dr. [name]   Any Other Special Instructions Will Be Listed Below (If Applicable).     If you need a refill on your cardiac medications before your next appointment, please call your pharmacy.

## 2015-10-21 NOTE — Progress Notes (Signed)
Cardiology Office Note   Date:  10/21/2015   ID:  [name] [name], DOB [birth year], MRN [id number]  PCP:  Terald Sleeper, PA-C  Cardiologist:  Dr. Ron Parker  Now Dr. [name] EP Dr. [name]  Chief Complaint  Patient presents with  . Cardiomyopathy    no SOB      History of Present Illness: [name] [name] is a 54 y.o. female who presents for ICM follow up.  She has a hx of nonischemic cardiomyopathy and bradycardia status post ICD implantation for primary prevention.  She has not had ischemic work up.  She has no complaints, no chest pain and no SOB.  She works at [employer] and it is physical job.  She was walking for exercise but has stopped due to cold weather.    She only takes her lasix if she swells.  Her wt has been stable and she does not use much salt in her diet.    Last echo 11/2014  Study Conclusions  - Left ventricle: The cavity size was moderately dilated. Systolic function was moderately reduced. The estimated ejection fraction was in the range of 35% to 40%. Diffuse hypokinesis. There was an increased relative contribution of atrial contraction to ventricular filling. Doppler parameters are consistent with abnormal left ventricular relaxation (grade 1 diastolic dysfunction). - Aortic valve: Trileaflet; normal thickness, mildly calcified leaflets. - Mitral valve: Mild, late systolicprolapse, involving the anterior leaflet. There was mild regurgitation directed eccentrically and toward the free wall. - Right ventricle: The cavity size was mildly dilated. Wall thickness was normal. Systolic function was mildly reduced. - Pulmonic valve: There was trivial regurgitation. - Impressions: The EF on last echo 8/14 was reported as 45% but appears after my review to be worse that that at 25-30%. The EF on current exam appears to be 35-40%.  Impressions:  - The EF on last echo 8/14 was reported as 45% but appears after my review to be worse that that at  25-30%. The EF on current exam appears to be 35-40%.  Pt was intolerant to spironolactone due to burning and she does not wish to resume. She could not aford BiDil today her BP is stable.  Her PCP started her on simvastatin for elevated chol.  Pt will bring by labs at another time.   Past Medical History  Diagnosis Date  . Nonischemic cardiomyopathy ([place])     Etiology unknown, diagnosed 2008, patient has never had catheterization or nuclear study as of March, 2011  . Bradycardia   . GERD (gastroesophageal reflux disease)   . Hypothyroidism   . Dyslipidemia     LDL elevation,Patient does not want statin  . TIA (transient ischemic attack)     No CT or MRI abnormality, aspirin therapy  . Weight gain   . Leg swelling     right leg  . Pain     left leg & left knee  . Cough due to angiotensin-converting enzyme inhibitor     But patient chooses to continue ACE  . ICD (implantable cardiac defibrillator) battery depletion     Dr. [name], June, 2009(artifactual atrial tachycardia response events addressed by reprogramming in parentheses  . Dizziness     Dizziness with standing after squatting, March, 2012  . Arthritis   . Itching   . Ejection fraction < 50%     Past Surgical History  Procedure Laterality Date  . Cardiac defibrillator placement      [facility] no remote  . Tubal ligation  94  . Upper gi endoscopy    . Colonoscopy    . Total thyroidectomy  2001  . Cyst excision      back of leg-lt   . Lateral epicondyle release  10/11/2012    Procedure: TENNIS ELBOW RELEASE;  Surgeon: [name], MD;  Location: [place];  Service: Orthopedics;  Laterality: Right;  RIGHT ELBOW: TENOTOMY ELBOW LATERAL EPICONDYLITIS TENNIS ELBOW: RADIAL TUNNEL RELEASE  . Implantable cardioverter defibrillator generator change N/A 12/29/2014    Procedure: IMPLANTABLE CARDIOVERTER DEFIBRILLATOR GENERATOR CHANGE;  Surgeon: [name], MD;  Location: [facility] CATH LAB;   Service: Cardiovascular;  Laterality: N/A;     Current Outpatient Prescriptions  Medication Sig Dispense Refill  . aspirin EC 81 MG tablet Take two tablets by mouth once daily.    . benazepril (LOTENSIN) 20 MG tablet Take 1 tablet (20 mg total) by mouth daily. 30 tablet 9  . carvedilol (COREG) 12.5 MG tablet Take 1 tablet (12.5 mg total) by mouth 2 (two) times daily with a meal. 60 tablet 9  . diphenhydrAMINE (BENADRYL) 50 MG tablet Take 50 mg by mouth at bedtime as needed for itching or allergies.    . Furosemide (LASIX PO) Take 10 mg by mouth daily.    [name] ibuprofen (ADVIL,MOTRIN) 800 MG tablet Take 800 mg by mouth every 6 (six) hours as needed for moderate pain.     [name] levothyroxine (SYNTHROID, LEVOTHROID) 88 MCG tablet Take 88 mcg by mouth daily before breakfast.    . lubiprostone (AMITIZA) 8 MCG capsule Take 8 mcg by mouth daily with breakfast.     . omeprazole (PRILOSEC) 20 MG capsule Take 40 mg by mouth daily.     . simvastatin (ZOCOR) 10 MG tablet Take 10 mg by mouth daily.     No current facility-administered medications for this visit.    Allergies:   Avelox; Kiwi extract; Moxifloxacin; Sulfa antibiotics; Bactrim; Clindamycin; Levofloxacin; Spironolactone; and Clindamycin/lincomycin    Social History:  The patient  reports that she has never smoked. She has never used smokeless tobacco. She reports that she does not drink alcohol or use illicit drugs.   Family History:  The patient's family history includes Heart attack in her mother; Hypertension in her brother and sister. There is no history of Stroke or Diabetes.    ROS:  General:no colds or fevers, no weight changes Skin:no rashes or ulcers HEENT:no blurred vision, no congestion CV:see HPI PUL:see HPI GI:no diarrhea constipation or melena, no indigestion GU:no hematuria, no dysuria MS:no joint pain, no claudication Neuro:no syncope, no lightheadedness Endo:no diabetes, + thyroid disease  Wt Readings from Last 3  Encounters:  10/21/15 202 lb (91.627 kg)  12/29/14 207 lb (93.895 kg)  12/12/14 206 lb (93.441 kg)     PHYSICAL EXAM: VS:  BP 118/80 mmHg  Pulse 68  Ht 5' 7.5" (1.715 m)  Wt 202 lb (91.627 kg)  BMI 31.15 kg/m2  SpO2 99% , BMI Body mass index is 31.15 kg/(m^2). General:Pleasant affect, NAD Skin:Warm and dry, brisk capillary refill HEENT:normocephalic, sclera clear, mucus membranes moist Neck:supple, no JVD, no bruits  Heart:S1S2 RRR without murmur, gallup, rub or click Lungs:clear without rales, rhonchi, or wheezes [name], non tender, + BS, do not palpate liver spleen or masses Ext:no lower ext edema, 2+ pedal pulses, 2+ radial pulses Neuro:alert and oriented, MAE, follows commands, + facial symmetry    EKG:  EKG is NOT ordered today.   Recent Labs: 12/12/2014: B Natriuretic Peptide  24.7 12/23/2014: BUN 13; Creatinine, Ser 0.93; Hemoglobin 12.0; Platelets 192.0; Potassium 4.2; Sodium 140    Lipid Panel No results found for: CHOL, TRIG, HDL, CHOLHDL, VLDL, LDLCALC, LDLDIRECT     Other studies Reviewed: Additional studies/ records that were reviewed today include: see echo.   ASSESSMENT AND PLAN:  1.  NICM most recent EF 35-40% [place] II no chest pain or SOB--repeat echo in Feb. And folow up with Dr. [name] in March.  2. Hyperlipidemia followed by PCP on statin  3. Hx ICD followed by Dr. [name] no discharges.    Current medicines are reviewed with the patient today.  The patient Has no concerns regarding medicines.  The following changes have been made:  See above Labs/ tests ordered today include:see above  Disposition:   FU:  see above  Lennie Muckle, NP  10/21/2015 11:57 AM    [facility] [place], [place], [place], [place] Phone: [phone]; Fax: [phone]

## 2015-10-25 DIAGNOSIS — Z923 Personal history of irradiation: Secondary | ICD-10-CM

## 2015-10-25 HISTORY — DX: Personal history of irradiation: Z92.3

## 2015-11-02 ENCOUNTER — Ambulatory Visit (INDEPENDENT_AMBULATORY_CARE_PROVIDER_SITE_OTHER): Payer: BLUE CROSS/BLUE SHIELD | Admitting: *Deleted

## 2015-11-02 ENCOUNTER — Telehealth: Payer: Self-pay | Admitting: Cardiology

## 2015-11-02 DIAGNOSIS — I429 Cardiomyopathy, unspecified: Secondary | ICD-10-CM

## 2015-11-02 NOTE — Progress Notes (Signed)
Remote ICD transmission.   

## 2015-11-02 NOTE — Telephone Encounter (Signed)
LMOVM reminding pt to send remote transmission.   

## 2015-11-24 LAB — CUP PACEART REMOTE DEVICE CHECK
Battery Remaining Longevity: 126 mo
Brady Statistic RA Percent Paced: 1 %
Brady Statistic RV Percent Paced: 0 %
Date Time Interrogation Session: 20170109194500
HighPow Impedance: 53 Ohm
Implantable Lead Implant Date: 20090320
Implantable Lead Location: 753859
Implantable Lead Model: 5076
Implantable Lead Serial Number: 182504
Lead Channel Pacing Threshold Amplitude: 0.4 V
Lead Channel Pacing Threshold Amplitude: 0.7 V
Lead Channel Pacing Threshold Pulse Width: 0.4 ms
Lead Channel Setting Pacing Pulse Width: 0.4 ms
Lead Channel Setting Sensing Sensitivity: 0.6 mV
MDC IDC LEAD IMPLANT DT: 20090320
MDC IDC LEAD LOCATION: 753860
MDC IDC LEAD MODEL: 158
MDC IDC MSMT BATTERY REMAINING PERCENTAGE: 100 %
MDC IDC MSMT LEADCHNL RA IMPEDANCE VALUE: 544 Ohm
MDC IDC MSMT LEADCHNL RA PACING THRESHOLD PULSEWIDTH: 0.4 ms
MDC IDC MSMT LEADCHNL RV IMPEDANCE VALUE: 512 Ohm
MDC IDC PG SERIAL: 111826
MDC IDC SET LEADCHNL RA PACING AMPLITUDE: 2 V
MDC IDC SET LEADCHNL RV PACING AMPLITUDE: 2.5 V

## 2015-11-25 ENCOUNTER — Encounter: Payer: Self-pay | Admitting: Cardiology

## 2015-12-08 ENCOUNTER — Other Ambulatory Visit: Payer: Self-pay

## 2015-12-08 ENCOUNTER — Ambulatory Visit (HOSPITAL_COMMUNITY): Payer: BLUE CROSS/BLUE SHIELD | Attending: Cardiology

## 2015-12-08 DIAGNOSIS — I5189 Other ill-defined heart diseases: Secondary | ICD-10-CM | POA: Diagnosis not present

## 2015-12-08 DIAGNOSIS — E785 Hyperlipidemia, unspecified: Secondary | ICD-10-CM | POA: Diagnosis not present

## 2015-12-08 DIAGNOSIS — I429 Cardiomyopathy, unspecified: Secondary | ICD-10-CM | POA: Diagnosis not present

## 2015-12-08 DIAGNOSIS — I517 Cardiomegaly: Secondary | ICD-10-CM | POA: Insufficient documentation

## 2015-12-08 DIAGNOSIS — I340 Nonrheumatic mitral (valve) insufficiency: Secondary | ICD-10-CM | POA: Insufficient documentation

## 2015-12-08 DIAGNOSIS — Z8673 Personal history of transient ischemic attack (TIA), and cerebral infarction without residual deficits: Secondary | ICD-10-CM | POA: Diagnosis not present

## 2015-12-08 DIAGNOSIS — I341 Nonrheumatic mitral (valve) prolapse: Secondary | ICD-10-CM | POA: Diagnosis not present

## 2015-12-09 ENCOUNTER — Other Ambulatory Visit (HOSPITAL_COMMUNITY): Payer: BLUE CROSS/BLUE SHIELD

## 2015-12-09 ENCOUNTER — Encounter: Payer: Self-pay | Admitting: Cardiology

## 2015-12-09 ENCOUNTER — Telehealth: Payer: Self-pay | Admitting: *Deleted

## 2015-12-09 DIAGNOSIS — I428 Other cardiomyopathies: Secondary | ICD-10-CM

## 2015-12-09 NOTE — Telephone Encounter (Signed)
-----   Message from Isaiah Serge, NP sent at 12/09/2015  4:38 PM EST ----- Please let pt know that echo is similar to last one but one area we could not see well - I discussed with Dr. [name] and we need to do a limited repeat study with a contrast agent.  Definity contrast.  Anderson Malta please arrange- pt to see Dr. [name] in March. [name]

## 2015-12-09 NOTE — Telephone Encounter (Signed)
Attempted to call pt's phone numbers and both give a fast busy signal like they are disconnected. Called mother's number and she states pt is currently at work. Left message to have pt call our office tomorrow.

## 2015-12-10 NOTE — Telephone Encounter (Signed)
Informed pt of echo results. Pt verbalized understanding and was in agreement with getting limited echo.

## 2015-12-10 NOTE — Telephone Encounter (Signed)
Left message to call back  

## 2015-12-24 ENCOUNTER — Other Ambulatory Visit (HOSPITAL_COMMUNITY): Payer: BLUE CROSS/BLUE SHIELD

## 2015-12-25 ENCOUNTER — Other Ambulatory Visit (HOSPITAL_COMMUNITY): Payer: BLUE CROSS/BLUE SHIELD

## 2016-01-14 ENCOUNTER — Encounter: Payer: Self-pay | Admitting: Cardiology

## 2016-01-14 ENCOUNTER — Ambulatory Visit (INDEPENDENT_AMBULATORY_CARE_PROVIDER_SITE_OTHER): Payer: BLUE CROSS/BLUE SHIELD | Admitting: Cardiology

## 2016-01-14 VITALS — BP 136/80 | HR 64 | Ht 67.5 in | Wt 201.0 lb

## 2016-01-14 DIAGNOSIS — N94819 Vulvodynia, unspecified: Secondary | ICD-10-CM

## 2016-01-14 DIAGNOSIS — I5022 Chronic systolic (congestive) heart failure: Secondary | ICD-10-CM | POA: Diagnosis not present

## 2016-01-14 DIAGNOSIS — I10 Essential (primary) hypertension: Secondary | ICD-10-CM

## 2016-01-14 DIAGNOSIS — I429 Cardiomyopathy, unspecified: Secondary | ICD-10-CM

## 2016-01-14 DIAGNOSIS — I428 Other cardiomyopathies: Secondary | ICD-10-CM

## 2016-01-14 MED ORDER — SACUBITRIL-VALSARTAN 24-26 MG PO TABS: 1.0000 | ORAL_TABLET | Freq: Two times a day (BID) | ORAL | Status: DC

## 2016-01-14 NOTE — Patient Instructions (Addendum)
Medication Instructions:   STOP BENAZEPRIL NOW, DON'T TAKE THIS MEDICATION AT ALL TODAY   START ENTRESTO 24/26 MG BY MOUTH TWICE DAILY---YOU CANNOT TAKE THIS MEDICATION UNTIL THIS Sunday 01/17/16, DUE TO FLUSHING BENAZEPRIL OUT OF YOUR SYSTEM    Follow-Up:  3 MONTHS WITH DR [name]     If you need a refill on your cardiac medications before your next appointment, please call your pharmacy.

## 2016-01-14 NOTE — Progress Notes (Signed)
Patient ID: [name] [name], female   DOB: Aug 23, 1961, 55 y.o.   MRN: [id number]      Cardiology Office Note   Date:  01/14/2016   ID:  [name] [name], DOB [birth year], MRN [id number]  PCP:  Terald Sleeper, PA-C  Cardiologist:  Dr. Ron Parker  Now Dr. [name] EP Dr. [name]  Chief complaint: follow up for NICM    History of Present Illness: [name] [name] is a 55 y.o. female who presents for ICM follow up. She has a hx of nonischemic cardiomyopathy and bradycardia status post ICD implantation for primary prevention.  She has not had ischemic work up.  She has been followed by Dr. Ron Parker. 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 She denies any chest pain, shortness of breath, lower extremity edema, orthopnea or proximal nocturnal dyspnea. She only takes Lasix as needed. She has noticed that her blood pressure has been going At her last visit to the OB/GYN it was 170. For the last 2 months she has been 8 experiencing significant discomfort for secondary to her vulvodynia, testing for infections, STDs has all been negative. She has been tried on Premarin without any significant improvement. She denies any ICD firing, palpitations or syncope.  Last echo 11/2014   - Left ventricle: The cavity size was moderately dilated. Systolic function was moderately reduced. The estimated ejection fraction was in the range of 35% to 40%. Diffuse hypokinesis. There was an increased relative contribution of atrial contraction to ventricular filling. Doppler parameters are consistent with abnormal left ventricular relaxation (grade 1 diastolic dysfunction). - Aortic valve: Trileaflet; normal thickness, mildly calcified leaflets. - Mitral valve: Mild, late systolicprolapse, involving the anterior leaflet. There was mild regurgitation directed eccentrically and toward the free wall. - Right ventricle: The cavity size was  mildly dilated. Wall thickness was normal. Systolic function was mildly reduced. - Pulmonic valve: There was trivial regurgitation. - Impressions: The EF on last echo 8/14 was reported as 45% but appears after my review to be worse that that at 25-30%. The EF on current exam appears to be 35-40%.  Impressions:  - The EF on last echo 8/14 was reported as 45% but appears after my review to be worse that that at 25-30%. The EF on current exam appears to be 35-40%.   TTE: 12/08/2015  Left ventricle: The cavity size was mildly dilated. Wall  thickness was normal. Systolic function was moderately to  severely reduced. The estimated ejection fraction was in the  range of 30% to 35%. Diffuse hypokinesis. - Mitral valve: Mild prolapse, involving the anterior leaflet.  There was mild regurgitation.  Impressions: - Moderate to severe global reduction in LV systolic function;  grade 1 diastolic dysfunction; mild prolapse of anterior MV  leaflet with mild MR; mild TR. Cannot R/O apical thrombus;  suggest fu limited study with contrast to further assess.    Pt was intolerant to spironolactone due to burning and she does not wish to resume. She could not aford BiDil today her BP is stable.  Her PCP started her on simvastatin for elevated chol.  Pt will bring by labs at another time.  Past Medical History  Diagnosis Date  . Nonischemic cardiomyopathy ([place])     Etiology unknown, diagnosed 2008, patient has never had catheterization or nuclear study as of March, 2011  . Bradycardia   . GERD (gastroesophageal reflux  disease)   . Hypothyroidism   . Dyslipidemia     LDL elevation,Patient does not want statin  . TIA (transient ischemic attack)     No CT or MRI abnormality, aspirin therapy  . Weight gain   . Leg swelling     right leg  . Pain     left leg & left knee  . Cough due to angiotensin-converting enzyme inhibitor     But patient chooses to continue ACE  . ICD  (implantable cardiac defibrillator) battery depletion     Dr. [name], June, 2009(artifactual atrial tachycardia response events addressed by reprogramming in parentheses  . Dizziness     Dizziness with standing after squatting, March, 2012  . Arthritis   . Itching   . Ejection fraction < 50%     Past Surgical History  Procedure Laterality Date  . Cardiac defibrillator placement      [facility] Scientific no remote  . Tubal ligation  94  . Upper gi endoscopy    . Colonoscopy    . Total thyroidectomy  2001  . Cyst excision      back of leg-lt   . Lateral epicondyle release  10/11/2012    Procedure: TENNIS ELBOW RELEASE;  Surgeon: [name], MD;  Location: [facility];  Service: Orthopedics;  Laterality: Right;  RIGHT ELBOW: TENOTOMY ELBOW LATERAL EPICONDYLITIS TENNIS ELBOW: RADIAL TUNNEL RELEASE  . Implantable cardioverter defibrillator generator change N/A 12/29/2014    Procedure: IMPLANTABLE CARDIOVERTER DEFIBRILLATOR GENERATOR CHANGE;  Surgeon: [name], MD;  Location: [facility] CATH LAB;  Service: Cardiovascular;  Laterality: N/A;     Current Outpatient Prescriptions  Medication Sig Dispense Refill  . aspirin EC 81 MG tablet Take two tablets by mouth once daily.    . benazepril (LOTENSIN) 20 MG tablet Take 1 tablet (20 mg total) by mouth daily. 30 tablet 9  . carvedilol (COREG) 12.5 MG tablet Take 1 tablet (12.5 mg total) by mouth 2 (two) times daily with a meal. 60 tablet 9  . diphenhydrAMINE (BENADRYL) 50 MG tablet Take 50 mg by mouth at bedtime as needed for itching or allergies.    . Furosemide (LASIX PO) Take 10 mg by mouth daily as needed (for swelling).     [name] ibuprofen (ADVIL,MOTRIN) 800 MG tablet Take 800 mg by mouth every 6 (six) hours as needed for moderate pain.     [name] levothyroxine (SYNTHROID, LEVOTHROID) 88 MCG tablet Take 88 mcg by mouth daily before breakfast.    . omeprazole (PRILOSEC) 20 MG capsule Take 20 mg by mouth daily as needed.     .  simvastatin (ZOCOR) 10 MG tablet Take 10 mg by mouth daily. Reported on 01/14/2016     No current facility-administered medications for this visit.    Allergies:   Avelox; Kiwi extract; Moxifloxacin; Sulfa antibiotics; Bactrim; Clindamycin; Levofloxacin; Spironolactone; and Clindamycin/lincomycin    Social History:  The patient  reports that she has never smoked. She has never used smokeless tobacco. She reports that she does not drink alcohol or use illicit drugs.   Family History:  The patient's family history includes Heart attack in her mother; Hypertension in her brother and sister. There is no history of Stroke or Diabetes.    ROS:  General:no colds or fevers, no weight changes Skin:no rashes or ulcers HEENT:no blurred vision, no congestion CV:see HPI PUL:see HPI GI:no diarrhea constipation or melena, no indigestion GU:no hematuria, no dysuria MS:no joint pain, no claudication Neuro:no syncope, no  lightheadedness Endo:no diabetes, + thyroid disease  Wt Readings from Last 3 Encounters:  01/14/16 201 lb (91.173 kg)  10/21/15 202 lb (91.627 kg)  12/29/14 207 lb (93.895 kg)     PHYSICAL EXAM: VS:  BP 136/80 mmHg  Pulse 64  Ht 5' 7.5" (1.715 m)  Wt 201 lb (91.173 kg)  BMI 31.00 kg/m2 , BMI Body mass index is 31 kg/(m^2). General:Pleasant affect, NAD Skin:Warm and dry, brisk capillary refill HEENT:normocephalic, sclera clear, mucus membranes moist Neck:supple, no JVD, no bruits  Heart:S1S2 RRR without murmur, gallup, rub or click Lungs:clear without rales, rhonchi, or wheezes [id number], non tender, + BS, do not palpate liver spleen or masses Ext:no lower ext edema, 2+ pedal pulses, 2+ radial pulses Neuro:alert and oriented, MAE, follows commands, + facial symmetry  EKG:  EKG is NOT ordered today.  Recent Labs: No results found for requested labs within last 365 days.   Lipid Panel No results found for: CHOL, TRIG, HDL, CHOLHDL, VLDL, LDLCALC, LDLDIRECT   Other  studies Reviewed: Additional studies/ records that were reviewed today include: see echo.    ASSESSMENT AND PLAN:  1.  NICM most recent LVEF has worsened to 30-35% from 35-40%, [facility].  I will switch benazepril to Entresto 24/26 mg BID with 3 day wash out period.   2. Hyperlipidemia followed by PCP on statin  3. Hx ICD followed by Dr. [name] no discharges.   4, Hypertension - switch benazepril to Entresto 24/26 mg BID   5. Vulvodynia - meds reviewed, none that could potentially cause her pain  Follow up in 3 months.  Signed, Dorothy Spark, MD  01/14/2016 9:57 AM    [facility] [place], [place], [place] Phone: [phone]; Fax: [phone]

## 2016-01-15 ENCOUNTER — Telehealth: Payer: Self-pay

## 2016-01-15 NOTE — Telephone Encounter (Signed)
Prior auth for Entresto24-26 sent to [place].

## 2016-01-18 ENCOUNTER — Telehealth: Payer: Self-pay | Admitting: Cardiology

## 2016-01-18 NOTE — Telephone Encounter (Signed)
ERROR DID NOT  NEED TO OPEN ENCOUNTER

## 2016-01-19 ENCOUNTER — Telehealth: Payer: Self-pay

## 2016-01-19 NOTE — Telephone Encounter (Signed)
Entresto 24-26 approved through 01/18/2017. [id number].

## 2016-01-27 DIAGNOSIS — R300 Dysuria: Secondary | ICD-10-CM | POA: Diagnosis not present

## 2016-01-27 DIAGNOSIS — N952 Postmenopausal atrophic vaginitis: Secondary | ICD-10-CM | POA: Diagnosis not present

## 2016-01-27 DIAGNOSIS — L298 Other pruritus: Secondary | ICD-10-CM | POA: Diagnosis not present

## 2016-02-01 ENCOUNTER — Telehealth: Payer: Self-pay | Admitting: *Deleted

## 2016-02-01 NOTE — Telephone Encounter (Signed)
Rob from [employer] you power-Cover [employer]. Call in regards for [facility] [phone].

## 2016-02-02 NOTE — Telephone Encounter (Signed)
Already approved

## 2016-02-16 DIAGNOSIS — R928 Other abnormal and inconclusive findings on diagnostic imaging of breast: Secondary | ICD-10-CM | POA: Diagnosis not present

## 2016-02-16 DIAGNOSIS — Z1231 Encounter for screening mammogram for malignant neoplasm of breast: Secondary | ICD-10-CM | POA: Diagnosis not present

## 2016-02-24 DIAGNOSIS — R591 Generalized enlarged lymph nodes: Secondary | ICD-10-CM | POA: Diagnosis not present

## 2016-02-24 DIAGNOSIS — C50212 Malignant neoplasm of upper-inner quadrant of left female breast: Secondary | ICD-10-CM | POA: Diagnosis not present

## 2016-02-24 DIAGNOSIS — C50912 Malignant neoplasm of unspecified site of left female breast: Secondary | ICD-10-CM | POA: Diagnosis not present

## 2016-02-24 DIAGNOSIS — C50919 Malignant neoplasm of unspecified site of unspecified female breast: Secondary | ICD-10-CM

## 2016-02-24 DIAGNOSIS — N63 Unspecified lump in breast: Secondary | ICD-10-CM | POA: Diagnosis not present

## 2016-02-24 HISTORY — PX: BREAST BIOPSY: SHX20

## 2016-02-24 HISTORY — DX: Malignant neoplasm of unspecified site of unspecified female breast: C50.919

## 2016-03-03 ENCOUNTER — Encounter: Payer: Self-pay | Admitting: Radiation Oncology

## 2016-03-03 DIAGNOSIS — C50912 Malignant neoplasm of unspecified site of left female breast: Secondary | ICD-10-CM | POA: Diagnosis not present

## 2016-03-04 ENCOUNTER — Telehealth: Payer: Self-pay | Admitting: Hematology and Oncology

## 2016-03-04 ENCOUNTER — Encounter: Payer: Self-pay | Admitting: Hematology and Oncology

## 2016-03-04 ENCOUNTER — Ambulatory Visit (INDEPENDENT_AMBULATORY_CARE_PROVIDER_SITE_OTHER): Payer: BLUE CROSS/BLUE SHIELD | Admitting: *Deleted

## 2016-03-04 DIAGNOSIS — I429 Cardiomyopathy, unspecified: Secondary | ICD-10-CM | POA: Diagnosis not present

## 2016-03-04 DIAGNOSIS — I428 Other cardiomyopathies: Secondary | ICD-10-CM

## 2016-03-04 NOTE — Telephone Encounter (Signed)
Patient returned my call re referral to see VG. Spoke with patient re new patient appointment with VG 03/09/2016 @ 3:30 pm. Patient also seeing Dr. [name] at 2:30 pm. Patient demographic and insurance information confirmed. Date/time cleared with navigator.

## 2016-03-07 NOTE — Progress Notes (Signed)
Remote ICD transmission.   

## 2016-03-08 ENCOUNTER — Telehealth: Payer: Self-pay | Admitting: *Deleted

## 2016-03-08 ENCOUNTER — Encounter: Payer: Self-pay | Admitting: Radiation Oncology

## 2016-03-08 NOTE — Telephone Encounter (Signed)
Left voice message for the patient to bring her ICD card tomorrow so we can scan it in to our system 10:34 AM

## 2016-03-08 NOTE — Progress Notes (Signed)
Location of Breast Cancer: Left Breast 11 o'clock position   Histology per Pathology Report: 02/24/16 =Moderately differentiated invasive ductal carcinoma ,left axillary lymph node biopsy was neg.   Receptor Status: ER(), PR (), Her2-neu (), Ki-()  Did patient present with symptoms (if so, please note symptoms) or was this found on screening mammography?: Mammogram screening  at [facility]  03/17/16   Past/Anticipated interventions by surgeon, if any:Dr. [name], MD saw patient 03/03/16   Past/Anticipated interventions by medical oncology, if any: Chemotherapy : Dr. Nicholas Lose, MD appt 03/09/16 at 3:30pm  Lymphedema issues, if any:  NO  Pain issues, if any: NO  SAFETY ISSUES: NO  Prior radiation? NO  Pacemaker/ICD?  Yes, ICD sees Dr. Herbert Pun, called asked  Patient  to bring ICD card  Possible current pregnancy? NO  Is the patient on methotrexate? NO  Current Complaints / other details:  Widowed, menarche age 73, G1P2, TIA 2008,  CHF, ICD 2009, non smoker, drinks alcohol moderate use,  Mother and Maternal Aunt with breast cancer, Sees Dr. [name], [place] cardiologist 03/14/16    Rebecca Eaton, RN 03/08/2016,1:14 PM  BP 149/88 mmHg  Pulse 100  Temp(Src) 99 F (37.2 C) (Oral)  Resp 20  Ht '5\' 8"'  (1.727 m)  Wt 204 lb 1.6 oz (92.579 kg)  BMI 31.04 kg/m2  SpO2 100%  Wt Readings from Last 3 Encounters:  03/09/16 204 lb 1.6 oz (92.579 kg)  01/14/16 201 lb (91.173 kg)  10/21/15 202 lb (91.627 kg)

## 2016-03-09 ENCOUNTER — Ambulatory Visit
Admission: RE | Admit: 2016-03-09 | Discharge: 2016-03-09 | Disposition: A | Discharge status: Home / Self Care | Payer: BLUE CROSS/BLUE SHIELD | Source: Ambulatory Visit | Attending: Radiation Oncology | Admitting: Radiation Oncology

## 2016-03-09 ENCOUNTER — Telehealth: Payer: Self-pay | Admitting: Hematology and Oncology

## 2016-03-09 ENCOUNTER — Encounter: Payer: Self-pay | Admitting: Hematology and Oncology

## 2016-03-09 ENCOUNTER — Ambulatory Visit (HOSPITAL_BASED_OUTPATIENT_CLINIC_OR_DEPARTMENT_OTHER): Payer: BLUE CROSS/BLUE SHIELD | Admitting: Hematology and Oncology

## 2016-03-09 ENCOUNTER — Encounter: Payer: Self-pay | Admitting: Radiation Oncology

## 2016-03-09 VITALS — BP 149/88 | HR 100 | Temp 99.0°F | Resp 20 | Ht 68.0 in | Wt 204.1 lb

## 2016-03-09 DIAGNOSIS — C50512 Malignant neoplasm of lower-outer quadrant of left female breast: Secondary | ICD-10-CM

## 2016-03-09 DIAGNOSIS — Z510 Encounter for antineoplastic radiation therapy: Secondary | ICD-10-CM | POA: Diagnosis not present

## 2016-03-09 DIAGNOSIS — C50212 Malignant neoplasm of upper-inner quadrant of left female breast: Secondary | ICD-10-CM | POA: Insufficient documentation

## 2016-03-09 DIAGNOSIS — Z171 Estrogen receptor negative status [ER-]: Secondary | ICD-10-CM | POA: Diagnosis not present

## 2016-03-09 DIAGNOSIS — E039 Hypothyroidism, unspecified: Secondary | ICD-10-CM

## 2016-03-09 HISTORY — DX: Malignant neoplasm of unspecified site of unspecified female breast: C50.919

## 2016-03-09 NOTE — Telephone Encounter (Signed)
Gave and printed appt sched and avs for pt for June °

## 2016-03-09 NOTE — Progress Notes (Signed)
Radiation Oncology         [phone] ________________________________  Name: [name] [name] MRN: [id number]  Date: 03/09/2016  DOB: 1960-12-14  BH:ALPFX, [name] Stanley, MD     REFERRING PHYSICIAN: [name], MD   DIAGNOSIS: The encounter diagnosis was Malignant neoplasm of upper-inner quadrant of left female breast [facility]).   HISTORY OF PRESENT ILLNESS::[name] [name] is a 55 y.o. female who is seen for a new diagnosis of invasive ductal carcinoma of the left breast. The patient had put off screening mammogram for about 2 years due to caring for family members, and had a mammogram at [facility] on 02/24/2016. This revealed an irregular hypoechoic mass in the 11 o' clock position. Subsequent ultrasound was performed and revealed a 2.4 x 1.6 x 1.9 cm mass, and in the left axilla. There was a single lymph node with a mildly thickened cortex without additional concerns for adenopathy. She underwent a biopsy the same day revealing invasive ductal carcinoma. Her lymph node was also biopsied and was benign. The hormonal status of her breast cancer was ER/PR negative and HER2 2+.  She has an ICD and sees Dr. [name] for this. This is her second ICD and it was placed in March 2016. She comes today in consideration of radiotherapy as she will be undergoing left breast lumpectomy wth Dr. [name] once she received cardiac clearance. She has an appointment scheduled with Dr. Lindi Adie today at 3:30.  She is accompanied today by her sister and her daughter. She currently works with [employer]  PREVIOUS RADIATION THERAPY: No   PAST MEDICAL HISTORY:  has a past medical history of Nonischemic cardiomyopathy ([place]); Bradycardia; GERD (gastroesophageal reflux disease); Hypothyroidism; Dyslipidemia; TIA (transient ischemic attack); Weight gain; Leg swelling; Pain; Cough due to angiotensin-converting enzyme inhibitor; ICD (implantable cardiac defibrillator) battery depletion; Dizziness; Arthritis; Itching;  Ejection fraction < 50%; and Breast cancer ([name] [name]) (02/24/16).     PAST SURGICAL HISTORY: Past Surgical History  Procedure Laterality Date  . Cardiac defibrillator placement      [facility] Scientific no remote  . Tubal ligation  94  . Upper gi endoscopy    . Colonoscopy    . Total thyroidectomy  2001  . Cyst excision      back of leg-lt   . Lateral epicondyle release  10/11/2012    Procedure: TENNIS ELBOW RELEASE;  Surgeon: [name], MD;  Location: [facility];  Service: Orthopedics;  Laterality: Right;  RIGHT ELBOW: TENOTOMY ELBOW LATERAL EPICONDYLITIS TENNIS ELBOW: RADIAL TUNNEL RELEASE  . Implantable cardioverter defibrillator generator change N/A 12/29/2014    Procedure: IMPLANTABLE CARDIOVERTER DEFIBRILLATOR GENERATOR CHANGE;  Surgeon: [name], MD;  Location: [facility] CATH LAB;  Service: Cardiovascular;  Laterality: N/A;     FAMILY HISTORY: family history includes Cancer in her maternal aunt and mother; Heart attack in her mother; Hypertension in her brother and sister. There is no history of Stroke or Diabetes.   SOCIAL HISTORY:  reports that she has never smoked. She has never used smokeless tobacco. She reports that she does not drink alcohol or use illicit drugs.   ALLERGIES: Avelox; Kiwi extract; Moxifloxacin; Sulfa antibiotics; Bactrim; Clindamycin; Levofloxacin; Spironolactone; and Clindamycin/lincomycin   MEDICATIONS:  Current Outpatient Prescriptions  Medication Sig Dispense Refill  . aspirin EC 81 MG tablet Take two tablets by mouth once daily.    . carvedilol (COREG) 12.5 MG tablet Take 1 tablet (12.5 mg total) by mouth 2 (two) times daily with  a meal. 60 tablet 9  . diphenhydrAMINE (BENADRYL) 50 MG tablet Take 50 mg by mouth at bedtime as needed for itching or allergies.    . Furosemide (LASIX PO) Take 10 mg by mouth daily as needed (for swelling).     [name] ibuprofen (ADVIL,MOTRIN) 800 MG tablet Take 800 mg by mouth every 6 (six) hours as needed for  moderate pain.     [name] levothyroxine (SYNTHROID, LEVOTHROID) 88 MCG tablet Take 88 mcg by mouth daily before breakfast.    . omeprazole (PRILOSEC) 20 MG capsule Take 20 mg by mouth daily as needed.     . sacubitril-valsartan (ENTRESTO) 24-26 MG Take 1 tablet by mouth 2 (two) times daily. 180 tablet 3   No current facility-administered medications for this encounter.    REVIEW OF SYSTEMS:  On review of systems, the patient denies any lymphedema issues or pain. A complete review of systems was obtained and is otherwise negative.   PHYSICAL EXAM:  height is _0  (1.727 m) and weight is 204 lb 1.6 oz (92.579 kg). Her oral temperature is 99 F (37.2 C). Her blood pressure is 149/88 and her pulse is 100. Her respiration is 20 and oxygen saturation is 100%.     In general this is a well appearing African American female in no acute distress. She is alert and oriented x4 and appropriate throughout the examination. HEENT reveals that the patient is normocephalic, atraumatic. EOMs are intact. PERRLA. Skin is intact without any evidence of gross lesions. Cardiovascular exam reveals a regular rate and rhythm, no clicks rubs or murmurs are auscultated. Chest is clear to auscultation bilaterally. Lymphatic assessment is performed and does not reveal any adenopathy in the cervical, supraclavicular, axillary, or inguinal chains. Bilateral breasts are examined and the left reveals a 2-3 cm areas of fullness from 11 to 1 o clock position just superior to the nipple without palpable pain or nipple discharge. There is an easily palpable mass present just superior to the left nipple. Right breast is also examined and no palpable abnormalities are noted. No bleeding or discharge is noted from the nipple. An ICD is present is the upper chest on the left. Abdomen has active bowel sounds in all quadrants and is intact. The abdomen is soft, non tender, non distended. Lower extremities are negative for pretibial pitting edema,  deep calf tenderness, cyanosis or clubbing.  ECOG = 0  0 - Asymptomatic (Fully active, able to carry on all predisease activities without restriction)  1 - Symptomatic but completely ambulatory (Restricted in physically strenuous activity but ambulatory and able to carry out work of a light or sedentary nature. For example, light housework, office work)  2 - Symptomatic, <50% in bed during the day (Ambulatory and capable of all self care but unable to carry out any work activities. Up and about more than 50% of waking hours)  3 - Symptomatic, >50% in bed, but not bedbound (Capable of only limited self-care, confined to bed or chair 50% or more of waking hours)  4 - Bedbound (Completely disabled. Cannot carry on any self-care. Totally confined to bed or chair)  5 - Death   Eustace Pen MM, Creech RH, Tormey DC, et al. [phone]). "Toxicity and response criteria of the [facility]". Shelly Oncol. 5 (6): 649-55   LABORATORY DATA:  Lab Results  Component Value Date   WBC 4.5 12/23/2014   HGB 12.0 12/23/2014   HCT 35.7* 12/23/2014   MCV 88.5 12/23/2014  PLT 192.0 12/23/2014   Lab Results  Component Value Date   NA 140 12/23/2014   K 4.2 12/23/2014   CL 107 12/23/2014   CO2 32 12/23/2014   No results found for: ALT, AST, GGT, ALKPHOS, BILITOT    RADIOGRAPHY: No results found.     IMPRESSION: Ms. Marland is a 55 y.o female with Stage 2a T2 N0 invasive ductal carcinoma of the left breast.  The patient is a good candidate for lumpectomy and I would recommend adjuvant radiation treatment postoperatively as part of her breast conservation treatment strategy.  The patient does have a defibrillator on the same side. I believe that she could receive radiation treatment with tangent fields that would adequately avoid the defibrillator. She therefore an eye opinion is a candidate for lumpectomy.   PLAN: I spoke to the patient today regarding her diagnosis and options for  treatment. We discussed the equivalence in terms of survival and local failure between mastectomy and breast conservation. We discussed the role of radiation in decreasing local failures in patients who undergo lumpectomy. We discussed 6 1/2 weeks of treatment as an outpatient. We discussed the low likelihood of secondary malignancies. We discussed the possible side effects including but not limited to skin redness and fatigue. She will meet with Dr. Lindi Adie today to discuss other treatment options. I will see her back after surgery and CT simulation will be scheduled at that time.   I spent 60 minutes face to face with the patient and more than 50% of that time was spent in counseling and/or coordination of care.   ________________________________   Jodelle Gross, MD, PhD  This document serves as a record of services personally performed by Shona Simpson, PA and Kyung Rudd, MD. It was created on their behalf by Jenell Milliner, a trained medical scribe. The creation of this record is based on the scribe's personal observations and the provider's statements to them. This document has been checked and approved by the attending provider.

## 2016-03-09 NOTE — Progress Notes (Signed)
Please see the Nurse Progress Note in the MD Initial Consult Encounter for this patient. 

## 2016-03-09 NOTE — Progress Notes (Signed)
[facility] NOTE  Patient Care Team: Terald Sleeper, PA-C as PCP - General (General Practice)  CHIEF COMPLAINTS/PURPOSE OF CONSULTATION:  Newly diagnosed breast cancer  HISTORY OF PRESENTING ILLNESS:  [name] [name] 55 y.o. female is here because of recent diagnosis of left breast cancer. She had a routine screening mammogramhaving missed a couple of years of mammograms. The mammogram revealed an abnormality in the left breast for 7 minutes from the nipple measuring 2.4 cm by ultrasound. Axillary ultrasound revealed a single enlarged x-ray before with thickened cortex. Both breasts and axilla were biopsied. Final pathology revealed a triple negative moderate to poorly differentiated invasive ductal carcinoma. The lymph node biopsy came back negative.Ki-67 was 59%. She was seen by Dr. [name] and Dr. [name] and is here to discuss adjuvant treatment options. He is here accompanied by her daughter. She reports to me that she had severe cardiomyopathy diagnosed in 2007 and underwent an AICD implantation. However her most recent ejection fraction was only 35%. She does not have any symptoms of cardiac failure.  I reviewed her records extensively and collaborated the history with the patient.  SUMMARY OF ONCOLOGIC HISTORY:   Breast cancer of upper-inner quadrant of left female breast ([place])   02/24/2016 Mammogram Left breast mass 11:00 position 4 cm from nipple, 2.4 x 1.6 x 1.9 cm, single axillary lymph node noted, T2 N0 stage IIA clinical stage ([place])   02/24/2016 Initial Diagnosis Moderate to poorly differentiated IDC, lymph node biopsy negative, ER 0%, PR 0%, HER-2, 2+ by IHC, fish negative, ratio 1.2, Ki-67 59%     MEDICAL HISTORY:  Past Medical History  Diagnosis Date  . Nonischemic cardiomyopathy ([place])     Etiology unknown, diagnosed 2008, patient has never had catheterization or nuclear study as of March, 2011  . Bradycardia   . GERD (gastroesophageal reflux disease)   .  Hypothyroidism   . Dyslipidemia     LDL elevation,Patient does not want statin  . TIA (transient ischemic attack)     No CT or MRI abnormality, aspirin therapy  . Weight gain   . Leg swelling     right leg  . Pain     left leg & left knee  . Cough due to angiotensin-converting enzyme inhibitor     But patient chooses to continue ACE  . ICD (implantable cardiac defibrillator) battery depletion     Dr. [name], June, 2009(artifactual atrial tachycardia response events addressed by reprogramming in parentheses  . Dizziness     Dizziness with standing after squatting, March, 2012  . Arthritis   . Itching   . Ejection fraction < 50%   . Breast cancer ([place]) 02/24/16    Left breast cancer    SURGICAL HISTORY: Past Surgical History  Procedure Laterality Date  . Cardiac defibrillator placement      [facility] Scientific no remote  . Tubal ligation  94  . Upper gi endoscopy    . Colonoscopy    . Total thyroidectomy  2001  . Cyst excision      back of leg-lt   . Lateral epicondyle release  10/11/2012    Procedure: TENNIS ELBOW RELEASE;  Surgeon: [name], MD;  Location: [place];  Service: Orthopedics;  Laterality: Right;  RIGHT ELBOW: TENOTOMY ELBOW LATERAL EPICONDYLITIS TENNIS ELBOW: RADIAL TUNNEL RELEASE  . Implantable cardioverter defibrillator generator change N/A 12/29/2014    Procedure: IMPLANTABLE CARDIOVERTER DEFIBRILLATOR GENERATOR CHANGE;  Surgeon: [name], MD;  Location:  [place] CATH LAB;  Service: Cardiovascular;  Laterality: N/A;    SOCIAL HISTORY: Social History   Social History  . Marital Status: Single    Spouse Name: N/A  . Number of Children: N/A  . Years of Education: N/A   Occupational History  . Widow    Social History Main Topics  . Smoking status: Never Smoker   . Smokeless tobacco: Never Used  . Alcohol Use: No  . Drug Use: No  . Sexual Activity: Not on file   Other Topics Concern  . Not on file   Social History Narrative     FAMILY HISTORY: Family History  Problem Relation Age of Onset  . Heart attack Mother   . Cancer Mother     breast  . Hypertension Sister   . Hypertension Brother   . Stroke Neg Hx   . Diabetes Neg Hx   . Cancer Maternal Aunt     breast    ALLERGIES:  is allergic to avelox; kiwi extract; moxifloxacin; sulfa antibiotics; bactrim; clindamycin; levofloxacin; spironolactone; and clindamycin/lincomycin.  MEDICATIONS:  Current Outpatient Prescriptions  Medication Sig Dispense Refill  . aspirin EC 81 MG tablet Take two tablets by mouth once daily.    . carvedilol (COREG) 12.5 MG tablet Take 1 tablet (12.5 mg total) by mouth 2 (two) times daily with a meal. 60 tablet 9  . diphenhydrAMINE (BENADRYL) 50 MG tablet Take 50 mg by mouth at bedtime as needed for itching or allergies.    . Furosemide (LASIX PO) Take 10 mg by mouth daily as needed (for swelling).     [name] ibuprofen (ADVIL,MOTRIN) 800 MG tablet Take 800 mg by mouth every 6 (six) hours as needed for moderate pain.     [name] levothyroxine (SYNTHROID, LEVOTHROID) 88 MCG tablet Take 88 mcg by mouth daily before breakfast.    . omeprazole (PRILOSEC) 20 MG capsule Take 20 mg by mouth daily as needed.     . sacubitril-valsartan (ENTRESTO) 24-26 MG Take 1 tablet by mouth 2 (two) times daily. 180 tablet 3   No current facility-administered medications for this visit.    REVIEW OF SYSTEMS:   Constitutional: Denies fevers, chills or abnormal night sweats Eyes: Denies blurriness of vision, double vision or watery eyes Ears, nose, mouth, throat, and face: Denies mucositis or sore throat Respiratory: Denies cough, dyspnea or wheezes Cardiovascular: Denies palpitation, chest discomfort or lower extremity swelling Gastrointestinal:  Denies nausea, heartburn or change in bowel habits Skin: Denies abnormal skin rashes Lymphatics: Denies new lymphadenopathy or easy bruising Neurological:Denies numbness, tingling or new weaknesses Behavioral/Psych:  Mood is stable, no new changes  Breast:  Denies any palpable lumps or discharge All other systems were reviewed with the patient and are negative.  PHYSICAL EXAMINATION: ECOG PERFORMANCE STATUS: 0 - Asymptomatic  Filed Vitals:   03/09/16 1535  BP: 149/88  Pulse: 100  Temp: 99 F (37.2 C)  Resp: 20   Filed Weights   03/09/16 1535  Weight: 204 lb 1.6 oz (92.579 kg)    GENERAL:alert, no distress and comfortable SKIN: skin color, texture, turgor are normal, no rashes or significant lesions EYES: normal, conjunctiva are pink and non-injected, sclera clear OROPHARYNX:no exudate, no erythema and lips, buccal mucosa, and tongue normal  NECK: supple, thyroid normal size, non-tender, without nodularity LYMPH:  no palpable lymphadenopathy in the cervical, axillary or inguinal LUNGS: clear to auscultation and percussion with normal breathing effort HEART: regular rate & rhythm and no murmurs and no lower extremity edema  ABDOMEN:abdomen soft, non-tender and normal bowel sounds Musculoskeletal:no cyanosis of digits and no clubbing  PSYCH: alert & oriented x 3 with fluent speech NEURO: no focal motor/sensory deficits BREAST: No palpable nodules in breast. No palpable axillary or supraclavicular lymphadenopathy (exam performed in the presence of a chaperone)   LABORATORY DATA:  I have reviewed the data as listed Lab Results  Component Value Date   WBC 4.5 12/23/2014   HGB 12.0 12/23/2014   HCT 35.7* 12/23/2014   MCV 88.5 12/23/2014   PLT 192.0 12/23/2014   Lab Results  Component Value Date   NA 140 12/23/2014   K 4.2 12/23/2014   CL 107 12/23/2014   CO2 32 12/23/2014    RADIOGRAPHIC STUDIES: I have personally reviewed the radiological reports and agreed with the findings in the report.  ASSESSMENT AND PLAN:  Breast cancer of upper-inner quadrant of left female breast (HCC) Moderate to poorly differentiated IDC, lymph node biopsy negative, ER 0%, PR 0%, HER-2, 2+ by IHC, fish  negative, ratio 1.2, Ki-67 59%  Left breast mass 11:00 position 4 cm from nipple, 2.4 x 1.6 x 1.9 cm, single axillary lymph node noted, T2 N0 stage IIA clinical stage [facility])  Pathology and radiology counseling:Discussed with the patient, the details of pathology including the type of breast cancer,the clinical staging, the significance of ER, PR and HER-2/neu receptors and the implications for treatment. After reviewing the pathology in detail, we proceeded to discuss the different treatment options between surgery, radiation, chemotherapy, antiestrogen therapies.  Recommendation: genetic counseling 1. Breast conserving surgery with sentinel lymph node biopsy 2. Adjuvant chemotherapy with Taxotere and Cytoxan every 3 weeks 4 cycles ( this regimen was chosen because of her cardiomyopathy with a recent ejection fraction of 35% and an AICD) 3. Followed by adjuvant radiation therapy with Dr. Genia Harold. Patient is awaiting cardiac clearance for her surgery.  Chemotherapy Counseling: I discussed the risks and benefits of chemotherapy including the risks of nausea/ vomiting, risk of infection from low WBC count, fatigue due to chemo or anemia, bruising or bleeding due to low platelets, mouth sores, loss/ change in taste and decreased appetite. Liver and kidney function will be monitored through out chemotherapy as abnormalities in liver and kidney function may be a side effect of treatment.   Discussion: Patient expressed to me that she was very sad to hear that she will need chemotherapy.She is not convinced that she will receive it. I discussed with her that triple negative breast cancer is fairly aggressive and without chemotherapy she has a very high likelihood of relapse.I plan to see her back after surgery to discuss this once again with the patient. Ultimately it is the patient's decision.  Return to clinic after surgery to discuss treatment plan.  All questions were answered. The patient knows to  call the clinic with any problems, questions or concerns.    Rulon Eisenmenger, MD 03/09/2016

## 2016-03-09 NOTE — Assessment & Plan Note (Signed)
Moderate to poorly differentiated IDC, lymph node biopsy negative, ER 0%, PR 0%, HER-2, 2+ by IHC, fish negative, ratio 1.2, Ki-67 59%  Left breast mass 11:00 position 4 cm from nipple, 2.4 x 1.6 x 1.9 cm, single axillary lymph node noted, T2 N0 stage IIA clinical stage ([place])  Pathology and radiology counseling:Discussed with the patient, the details of pathology including the type of breast cancer,the clinical staging, the significance of ER, PR and HER-2/neu receptors and the implications for treatment. After reviewing the pathology in detail, we proceeded to discuss the different treatment options between surgery, radiation, chemotherapy, antiestrogen therapies.  Recommendation: 1. Breast conserving surgery with sentinel lymph node biopsy 2. Adjuvant chemotherapy with Taxotere and Cytoxan every 3 weeks 4 cycles ( this regimen was chosen because of her cardiomyopathy with a recent ejection fraction of 35% and an AICD) 3. Followed by adjuvant radiation therapy with Dr. Modi.  Chemotherapy Counseling: I discussed the risks and benefits of chemotherapy including the risks of nausea/ vomiting, risk of infection from low WBC count, fatigue due to chemo or anemia, bruising or bleeding due to low platelets, mouth sores, loss/ change in taste and decreased appetite. Liver and kidney function will be monitored through out chemotherapy as abnormalities in liver and kidney function may be a side effect of treatment.   Discussion: Patient expressed to me that she was very sad to hear that she will need chemotherapy.She is not convinced that she will receive it. I discussed with her that triple negative breast cancer is fairly aggressive and without chemotherapy she has a very high likelihood of relapse.I plan to see her back after surgery to discuss this once again with the patient. Ultimately it is the patient's decision.   Return to clinic after surgery to discuss treatment plan. 

## 2016-03-10 ENCOUNTER — Other Ambulatory Visit: Payer: Self-pay

## 2016-03-11 ENCOUNTER — Encounter: Payer: Self-pay | Admitting: Radiation Oncology

## 2016-03-14 ENCOUNTER — Ambulatory Visit (INDEPENDENT_AMBULATORY_CARE_PROVIDER_SITE_OTHER): Payer: BLUE CROSS/BLUE SHIELD | Admitting: Cardiology

## 2016-03-14 ENCOUNTER — Encounter: Payer: Self-pay | Admitting: Cardiology

## 2016-03-14 ENCOUNTER — Encounter: Payer: Self-pay | Admitting: *Deleted

## 2016-03-14 VITALS — BP 118/72 | HR 64 | Ht 68.0 in | Wt 204.0 lb

## 2016-03-14 DIAGNOSIS — I429 Cardiomyopathy, unspecified: Secondary | ICD-10-CM

## 2016-03-14 DIAGNOSIS — R200 Anesthesia of skin: Secondary | ICD-10-CM | POA: Insufficient documentation

## 2016-03-14 DIAGNOSIS — Z01812 Encounter for preprocedural laboratory examination: Secondary | ICD-10-CM

## 2016-03-14 DIAGNOSIS — I10 Essential (primary) hypertension: Secondary | ICD-10-CM | POA: Diagnosis not present

## 2016-03-14 DIAGNOSIS — I428 Other cardiomyopathies: Secondary | ICD-10-CM

## 2016-03-14 DIAGNOSIS — I5022 Chronic systolic (congestive) heart failure: Secondary | ICD-10-CM

## 2016-03-14 DIAGNOSIS — Z4502 Encounter for adjustment and management of automatic implantable cardiac defibrillator: Secondary | ICD-10-CM | POA: Diagnosis not present

## 2016-03-14 NOTE — Patient Instructions (Signed)
Medication Instructions:   Your physician recommends that you continue on your current medications as directed. Please refer to the Current Medication list given to you today.    Testing/Procedures:  Your physician has requested that you have an echocardiogram. Echocardiography is a painless test that uses sound waves to create images of your heart. It provides your doctor with information about the size and shape of your heart and how well your heart's chambers and valves are working. This procedure takes approximately one hour. There are no restrictions for this procedure.  DR [name] WOULD LIKE THIS ECHO SCHEDULED PRIOR TO THE PATIENTS 2 MONTH FOLLOW-UP APPOINTMENT WITH HER.      Follow-Up:  2 MONTHS WITH DR NELSON---PLEASE HAVE YOUR ECHO SCHEDULED A COUPLE WEEKS PRIOR TO THIS APPOINTMENT      If you need a refill on your cardiac medications before your next appointment, please call your pharmacy.

## 2016-03-14 NOTE — [place] Notes (Signed)
Patient ID: [name] [name], female   DOB: 02-28-1961, 55 y.o.   MRN: [id number]      Cardiology Office Note  Date:  03/14/2016   ID:  [name] [name], DOB [birth year], MRN [id number]  PCP:  Terald Sleeper, PA-C  Cardiologist:  Dr. Ron Parker  Now Dr. [name] EP Dr. [name] Referring physician: Dr. Shann Medal, M.D. general surgeon  Chief complaint: follow up for NICM, preop evaluation for breast surgery   History of Present Illness: [name] [name] is a 55 y.o. female who presents for ICM follow up. She has a hx of nonischemic cardiomyopathy and bradycardia status post ICD implantation for primary prevention.  She has not had ischemic work up.  She has been followed by Dr. Ron Parker. 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 She denies any chest pain, shortness of breath, lower extremity edema, orthopnea or PND. She only takes Lasix as needed. She has noticed that her blood pressure has been going At her last visit to the OB/GYN it was 170. For the last 2 months she has been 8 experiencing significant discomfort for secondary to her vulvodynia, testing for infections, STDs has all been negative. She has been tried on Premarin without any significant improvement. She denies any ICD firing, palpitations or syncope. Pt was intolerant to spironolactone due to burning and she does not wish to resume. She could not aford BiDil today her BP is stable. Her PCP started her on simvastatin for elevated chol.  Pt will bring by labs at another time.  03/14/2016, the patient is coming after 3 months, her repeat echocardiogram performed in April 2017 showed decreased LVEF of 30-35%. We have switched her medication and started her on Entresto 24/26 mg po BID. the patient was also diagnosed with stage II breast cancer on 02/26/2016 and is scheduled to undergo lumpectomy in the very near future by Dr. [name]. She underwent biopsy of her left  breast that showed 2.4 cm invasive ductal cancer of the left breast , with ER 0%, PR 0%, Ki-67 59%, and HER-2 Neu pending. Her plan for chemotherapy and radiation therapy is nonconclusive at this point and depends on results of breast surgery and results of the biopsy of her axillar lymph nodes. The patient states that overall since she was started on Entresto she has more energy, denies any chest pain or dyspnea on exertion, no shortness of breath at rest no lower extremity edema no orthopnea proximal nocturnal dyspnea no palpitations or syncope. She has been compliant with her meds. She can certainly walk several blocks.  Past Medical History  Diagnosis Date  . Nonischemic cardiomyopathy ([place])     Etiology unknown, diagnosed 2008, patient has never had catheterization or nuclear study as of March, 2011  . Bradycardia   . GERD (gastroesophageal reflux disease)   . Hypothyroidism   . Dyslipidemia     LDL elevation,Patient does not want statin  . TIA (transient ischemic attack)     No CT or MRI abnormality, aspirin therapy  . Weight gain   . Leg swelling     right leg  . Pain     left leg & left knee  . Cough due to angiotensin-converting enzyme inhibitor     But patient chooses to continue ACE  . ICD (implantable cardiac defibrillator) battery depletion     Dr. [name], June, 2009(artifactual atrial  tachycardia response events addressed by reprogramming in parentheses  . Dizziness     Dizziness with standing after squatting, March, 2012  . Arthritis   . Itching   . Ejection fraction < 50%   . Breast cancer ([place]) 02/24/16    Left breast cancer    Past Surgical History  Procedure Laterality Date  . Cardiac defibrillator placement      [facility] Scientific no remote  . Tubal ligation  94  . Upper gi endoscopy    . Colonoscopy    . Total thyroidectomy  2001  . Cyst excision      back of leg-lt   . Lateral epicondyle release  10/11/2012    Procedure: TENNIS ELBOW RELEASE;  Surgeon:  [name], MD;  Location: [place];  Service: Orthopedics;  Laterality: Right;  RIGHT ELBOW: TENOTOMY ELBOW LATERAL EPICONDYLITIS TENNIS ELBOW: RADIAL TUNNEL RELEASE  . Implantable cardioverter defibrillator generator change N/A 12/29/2014    Procedure: IMPLANTABLE CARDIOVERTER DEFIBRILLATOR GENERATOR CHANGE;  Surgeon: [name], MD;  Location: [facility] CATH LAB;  Service: Cardiovascular;  Laterality: N/A;     Current Outpatient Prescriptions  Medication Sig Dispense Refill  . aspirin EC 81 MG tablet Take two tablets by mouth once daily.    . carvedilol (COREG) 12.5 MG tablet Take 1 tablet (12.5 mg total) by mouth 2 (two) times daily with a meal. 60 tablet 9  . levothyroxine (SYNTHROID, LEVOTHROID) 88 MCG tablet Take 88 mcg by mouth daily before breakfast.    . omeprazole (PRILOSEC) 20 MG capsule Take 20 mg by mouth daily as needed.     . sacubitril-valsartan (ENTRESTO) 24-26 MG Take 1 tablet by mouth 2 (two) times daily. 180 tablet 3   No current facility-administered medications for this visit.    Allergies:   Avelox; Kiwi extract; Moxifloxacin; Sulfa antibiotics; Bactrim; Clindamycin; Levofloxacin; Spironolactone; and Clindamycin/lincomycin    Social History:  The patient  reports that she has never smoked. She has never used smokeless tobacco. She reports that she does not drink alcohol or use illicit drugs.   Family History:  The patient's family history includes Cancer in her maternal aunt and mother; Heart attack in her mother; Hypertension in her brother and sister. There is no history of Stroke or Diabetes.    ROS:  General:no colds or fevers, no weight changes Skin:no rashes or ulcers HEENT:no blurred vision, no congestion CV:see HPI PUL:see HPI GI:no diarrhea constipation or melena, no indigestion GU:no hematuria, no dysuria MS:no joint pain, no claudication Neuro:no syncope, no lightheadedness Endo:no diabetes, + thyroid disease  Wt Readings from  Last 3 Encounters:  03/14/16 204 lb (92.534 kg)  03/09/16 204 lb 1.6 oz (92.579 kg)  03/09/16 204 lb 1.6 oz (92.579 kg)     PHYSICAL EXAM: VS:  BP 118/72 mmHg  Pulse 64  Ht '5\' 8"'  (1.727 m)  Wt 204 lb (92.534 kg)  BMI 31.03 kg/m2 , BMI Body mass index is 31.03 kg/(m^2). General:Pleasant affect, NAD Skin:Warm and dry, brisk capillary refill HEENT:normocephalic, sclera clear, mucus membranes moist Neck:supple, no JVD, no bruits  Heart:S1S2 RRR without murmur, gallup, rub or click Lungs:clear without rales, rhonchi, or wheezes OIT:GPQD, non tender, + BS, do not palpate liver spleen or masses Ext:no lower ext edema, 2+ pedal pulses, 2+ radial pulses Neuro:alert and oriented, MAE, follows commands, + facial symmetry  EKG:  EKG is NOT ordered today.  Recent Labs: No results found for requested labs within last 365 days.  Lipid Panel No results found for: CHOL, TRIG, HDL, CHOLHDL, VLDL, LDLCALC, LDLDIRECT   TTE: 11/2014  Impressions: - The EF on last echo 8/14 was reported as 45% but appears after my review to be worse that that at 25-30%. The EF on current exam appears to be 35-40%.  TTE: 12/08/2015  Left ventricle: The cavity size was mildly dilated. Wall  thickness was normal. Systolic function was moderately to  severely reduced. The estimated ejection fraction was in the  range of 30% to 35%. Diffuse hypokinesis. - Mitral valve: Mild prolapse, involving the anterior leaflet.  There was mild regurgitation.  Impressions: - Moderate to severe global reduction in LV systolic function;  grade 1 diastolic dysfunction; mild prolapse of anterior MV  leaflet with mild MR; mild TR. Cannot R/O apical thrombus;  suggest fu limited study with contrast to further assess.    ASSESSMENT AND PLAN:  1.  NICM most recent LVEF has worsened to 30-35% from 35-40%, [facility] II.  We switched benazepril to Entresto 24/26 mg BID 2 months ago, and it is very well tolerated. We will  continue the same regimen and repeat echocardiogram for evaluation of LVEF in 2 months, at that time we will also look for a possible thrombus with definitive contrast.  2. Preop evaluation for breast surgery, the patient is an intermediate risk for breast surgery considering her LV dysfunction, however she is completely euvolemic and well compensated with no signs of angina or heart failure at this time. It is recommended that she continues taking carvedilol in a perioperative period. We will follow her closely for her regular checkups and also for monitoring of her LVEF is chemotherapy is considered.  3.  Hyperlipidemia followed by PCP on statin  4. Hx ICD followed by Dr. [name] no discharges.   5. Hypertension - switch benazepril to Entresto 24/26 mg BID   Follow up in 3 months.  Signed, Ena Dawley, MD  03/14/2016 4:04 PM    [facility] [place], [place] [address] [place], [place] Phone: [phone]; Fax: [phone]

## 2016-03-15 ENCOUNTER — Other Ambulatory Visit: Payer: Self-pay | Admitting: Surgery

## 2016-03-15 DIAGNOSIS — C50912 Malignant neoplasm of unspecified site of left female breast: Secondary | ICD-10-CM

## 2016-03-22 ENCOUNTER — Other Ambulatory Visit: Payer: Self-pay | Admitting: Surgery

## 2016-03-22 DIAGNOSIS — C50912 Malignant neoplasm of unspecified site of left female breast: Secondary | ICD-10-CM

## 2016-03-23 ENCOUNTER — Other Ambulatory Visit: Payer: Self-pay | Admitting: *Deleted

## 2016-03-23 ENCOUNTER — Telehealth: Payer: Self-pay | Admitting: Hematology and Oncology

## 2016-03-23 NOTE — Telephone Encounter (Signed)
Spoke with patient to confirm 6/20 appt at 3 pm

## 2016-03-28 ENCOUNTER — Ambulatory Visit: Payer: BLUE CROSS/BLUE SHIELD | Admitting: Cardiology

## 2016-03-30 ENCOUNTER — Encounter: Payer: Self-pay | Admitting: Cardiology

## 2016-03-30 ENCOUNTER — Other Ambulatory Visit (HOSPITAL_COMMUNITY): Payer: BLUE CROSS/BLUE SHIELD

## 2016-04-01 ENCOUNTER — Ambulatory Visit
Admission: RE | Admit: 2016-04-01 | Discharge: 2016-04-01 | Disposition: A | Discharge status: Home / Self Care | Payer: BLUE CROSS/BLUE SHIELD | Source: Ambulatory Visit | Attending: Surgery | Admitting: Surgery

## 2016-04-01 ENCOUNTER — Encounter (HOSPITAL_COMMUNITY): Payer: Self-pay

## 2016-04-01 ENCOUNTER — Encounter (HOSPITAL_COMMUNITY)
Admission: RE | Admit: 2016-04-01 | Discharge: 2016-04-01 | Disposition: A | Discharge status: Home / Self Care | Payer: BLUE CROSS/BLUE SHIELD | Source: Ambulatory Visit | Attending: Surgery | Admitting: Surgery

## 2016-04-01 ENCOUNTER — Other Ambulatory Visit (HOSPITAL_COMMUNITY): Payer: Self-pay | Admitting: *Deleted

## 2016-04-01 DIAGNOSIS — R9431 Abnormal electrocardiogram [ECG] [EKG]: Secondary | ICD-10-CM | POA: Diagnosis not present

## 2016-04-01 DIAGNOSIS — Z01818 Encounter for other preprocedural examination: Secondary | ICD-10-CM | POA: Diagnosis not present

## 2016-04-01 DIAGNOSIS — Z01812 Encounter for preprocedural laboratory examination: Secondary | ICD-10-CM | POA: Diagnosis not present

## 2016-04-01 DIAGNOSIS — C50912 Malignant neoplasm of unspecified site of left female breast: Secondary | ICD-10-CM | POA: Diagnosis not present

## 2016-04-01 DIAGNOSIS — C50919 Malignant neoplasm of unspecified site of unspecified female breast: Secondary | ICD-10-CM | POA: Diagnosis not present

## 2016-04-01 DIAGNOSIS — I429 Cardiomyopathy, unspecified: Secondary | ICD-10-CM | POA: Diagnosis not present

## 2016-04-01 HISTORY — DX: Presence of automatic (implantable) cardiac defibrillator: Z95.810

## 2016-04-01 LAB — CBC
HEMATOCRIT: 35.9 % — AB (ref 36.0–46.0)
HEMOGLOBIN: 11.4 g/dL — AB (ref 12.0–15.0)
MCH: 28.9 pg (ref 26.0–34.0)
MCHC: 31.8 g/dL (ref 30.0–36.0)
MCV: 91.1 fL (ref 78.0–100.0)
Platelets: 197 10*3/uL (ref 150–400)
RBC: 3.94 MIL/uL (ref 3.87–5.11)
RDW: 12.7 % (ref 11.5–15.5)
WBC: 4.7 10*3/uL (ref 4.0–10.5)

## 2016-04-01 LAB — BASIC METABOLIC PANEL
ANION GAP: 5 (ref 5–15)
BUN: 8 mg/dL (ref 6–20)
CO2: 28 mmol/L (ref 22–32)
Calcium: 9.4 mg/dL (ref 8.9–10.3)
Chloride: 112 mmol/L — ABNORMAL HIGH (ref 101–111)
Creatinine, Ser: 0.86 mg/dL (ref 0.44–1.00)
GFR calc Af Amer: >60 mL/min (ref >60)
GFR calc non Af Amer: >60 mL/min (ref >60)
GLUCOSE: 85 mg/dL (ref 65–99)
POTASSIUM: 4.2 mmol/L (ref 3.5–5.1)
Sodium: 145 mmol/L (ref 135–145)

## 2016-04-01 NOTE — Pre-Procedure Instructions (Addendum)
[name] [name]  [name]      [facility] - [name], [name] - [name] [name] [address] Phone: [phone] Fax: [phone]    [name] [name] .  Report to [name] [name] Entrance "A" Admitting Office at 5:30 AM  Call this number if you have problems the morning of surgery: [phone]  Any questions prior to day of surgery, please call [phone] between 8 & 4 PM.   Remember:  Do not eat food or drink liquids after midnight Monday, 04/04/16.  Take these medicines the morning of surgery with A SIP OF WATER: Carvedilol (Coreg), Levothyroxine (Synthroid), Omeprazole (Prilosec) - if needed  Stop Aspirin and NSAIDS (Ibuprofen, Aleve, etc.) as of today.all vitamins also   Do not wear jewelry, make-up or nail polish.  Do not wear lotions, powders, or perfumes.  You may NOT wear deodorant.  Do not shave 48 hours prior to surgery.   Do not bring valuables to the hospital.  [name] is not responsible for any belongings or valuables.  Contacts, dentures or bridgework may not be worn into surgery.  Leave [name] suitcase in the car.  After surgery it may be brought to [name] room.  For patients admitted to the hospital, discharge time will be determined by [name] treatment team.  Patients discharged the day of surgery will not be allowed to drive home.   Special instructions: [name] [name] - Preparing for Surgery  Before surgery, you can play an important role.  Because skin is not sterile, [name] skin needs to be as free of germs as possible.  You can reduce the number of germs on you skin by washing with CHG (chlorahexidine gluconate) soap before surgery.  CHG is an antiseptic cleaner which kills germs and bonds with the skin to continue killing germs even after washing.  Please DO NOT use if you have an allergy to CHG or antibacterial soaps.  If [name] skin becomes reddened/irritated stop using the CHG and  inform [name] nurse when you arrive at Short Stay.  Do not shave (including legs and underarms) for at least 48 hours prior to the first CHG shower.  You may shave [name] face.  Please follow these instructions carefully:   1.  Shower with CHG Soap the night before surgery and the                                morning of Surgery.  2.  If you choose to wash [name] hair, wash [name] hair first as usual with [name]       normal shampoo.  3.  After you shampoo, rinse [name] hair and body thoroughly to remove the                      Shampoo.  4.  Use CHG as you would any other liquid soap.  You can apply chg directly       to the skin and wash gently with scrungie or a clean washcloth.  5.  Apply the CHG Soap to [name] body ONLY FROM THE NECK DOWN.        Do not use on open wounds or open sores.  Avoid contact with [name] eyes, ears, mouth and genitals (private parts).  Wash genitals (private parts) with [name] normal soap.  6.  Wash thoroughly, paying special  attention to the area where [name] surgery        will be performed.  7.  Thoroughly rinse [name] body with warm water from the neck down.  8.  DO NOT shower/wash with [name] normal soap after using and rinsing off       the CHG Soap.  9.  Pat yourself dry with a clean towel.            10.  Wear clean pajamas.            11.  Place clean sheets on [name] bed the night of [name] first shower and do not        sleep with pets.  Day of Surgery  Do not apply any lotions/deodorants the morning of surgery.  Please wear clean clothes to the hospital.   Please read over the following fact sheets that you were given. Pain Booklet, Coughing and Deep Breathing and Surgical Site Infection Prevention

## 2016-04-04 MED ORDER — CEFAZOLIN SODIUM-DEXTROSE 2-4 GM/100ML-% IV SOLN
2.0000 g | INTRAVENOUS | Status: AC
  Administered 2016-04-05: 2 g via INTRAVENOUS
  Filled 2016-04-04: qty 100

## 2016-04-04 NOTE — Progress Notes (Signed)
Anesthesia Chart Review: Patient is a 55 year old female scheduled for left breast lumpectomy, sentinel node biopsy, Power Port placement on 04/05/16 by Dr. [name].  History includes non-smoker, bradycardia, non-ischemic cardiomyopathy 08/2006 (presumed; no history of stress/cath), chronic systolic CHF, ICD [employer]) 01/11/08 (generator replacement 12/29/14), GERD, total thyroidectomy '01 with secondary hypothyroidism, dyslipidemia, TIA 08/2006, left breast cancer 02/24/16. BMI is consistent with mild obesity.    PCP is Particia Nearing, PA-C. HEM-ONC is Dr. Nicholas Lose. RAD-ONC is Dr. Kyung Rudd.  Primary cardiologist is Dr. Ena Dawley (previously Dr. Ron Parker prior to his retirement), last visit 03/14/16 for follow-up and pre-operative evaluation. Her plans included switching benazepril to Entresto and repeat echo in 2 months to re-evaluate for LVEF and to also look for a possible thrombus with definitive contrast. In regarding to preoperative evaluation, she wrote, "...the patient is an intermediate risk for breast surgery considering her LV dysfunction, however she is completely euvolemic and well compensated with no signs of angina or heart failure at this time. It is recommended that she continues taking carvedilol in a perioperative period. We will follow her closely for her regular checkups and also for monitoring of her LVEF is chemotherapy is considered." EP cardiologist is Dr. [name].  Meds include ASA 81 mg, Coreg, levothyroxine, Prilosec, Entresto.   04/01/16 EKG: A-paced rhythm with prolonged AV conduction. Negative T wave III (old).  12/08/15 Echo: Study Conclusions - Left ventricle: The cavity size was mildly dilated. Wall  thickness was normal. Systolic function was moderately to  severely reduced. The estimated ejection fraction was in the  range of 30% to 35%. Diffuse hypokinesis. - Mitral valve: Mild prolapse, involving the anterior leaflet.  There was mild  regurgitation. Impressions: - Moderate to severe global reduction in LV systolic function;  grade 1 diastolic dysfunction; mild prolapse of anterior MV  leaflet with mild MR; mild TR. Cannot R/O apical thrombus;  suggest fu limited study with contrast to further assess.  Preoperative labs noted.  Dr. [name] has cleared patient for surgery. EP cardiology has recommended that tachy therapies be disabled on her ICD prior to surgery and returned to normal programming afterwards. Further evaluation on the day of surgery to ensure no acute changes.  George Hugh [facility] Short Stay Center/Anesthesiology Phone [phone] 04/04/2016 9:42 AM

## 2016-04-04 NOTE — H&P (Signed)
[name] [name]  Location: [facility] Patient #: [id number] DOB: [birth year] Widowed / Language: [place] / Race: Black or African American Female  History of Present Illness   The patient is a 55 year old female who presents with breast cancer.   Her PCP is Particia Nearing, [place].  She is accompanied by her daughter, Charlena Cross.   The patient had mammograms at [facility] on 16 February 2016 which showed a possible left breast mass. The patient thought that she felt something in that area of her breast. Her last mammogram was about 2 ot 3 years ago. She had follow-up ultrasound and biopsy on 24 Feb 2016. The ultrasound showed a 2.4 x 1.9 mass in the 11 o'clock position of the left breast. Pathology report from [facility] showed a moderately to poorly differentiated invasive ductal carcinoma of the left breast. A left axillary lymph node biopsy was negative. At this time I don't have receptor status. She went through menopause about 2012. She is not on hormone medicine. Her mother and her mother's sister had breast cancer.  [Note: her markers came back after she left. ER - 0%, PR - 0%, Ki67 - 59%, and Her2Neu pending. So there is good chance she'll need chemotherapy]  I discussed the options for breast cancer treatment with the patient. I discussed a multidisciplinary approach to the treatment of breast cancer, which includes medical oncology and radiation oncology. I discussed the surgical options of lumpectomy vs. mastectomy. If mastectomy, there is the possibility of reconstruction. I discussed the options of lymph node biopsy. The treatment plan depends on the pathologic staging of the tumor and the patient's personal wishes. The risks of surgery include, but are not limited to, bleeding, infection, the need for further surgery, and nerve injury. The patient has been given literature on the treatment of breast cancer.  Plan: 1) Cardiac clearance with  Dr. [name], 2) Medical and radiation oncology consult, 3) Genetics couseling, 4) Will talk to patient after these consults, 5) There is a chance we could recommend neoadjuvant tx  Past Medical History: 1. cardiomyopathy and bradycardia status post ICD implantation She is followed by Dr. Liane Comber. Dr. [name] placed ICD. Her first was implantation was 2009, she had a replacement Mar 2016. History of CHF. Her last echo was 12/09/2015 - EF 35 - 40% 2. TIA - 2008 3. Colonoscopy - 2010 4. Thyroidectomy - 01/18/2000 - [place]  For multinodular goiter  Social Hsitory: Widowed. 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 Seh is accompanied by her daughter, Charlena Cross. Also has 34 yo son, who lives with her.   Other Problems Marjean Donna, [place]; 03/03/2016 8:59 AM) Cerebrovascular Accident Congestive Heart Failure Gastroesophageal Reflux Disease Hemorrhoids Lump In Breast  Past Surgical History Marjean Donna, CMA; 03/03/2016 8:59 AM) Breast Biopsy Left. Thyroid Surgery  Diagnostic Studies History Marjean Donna, CMA; 03/03/2016 8:59 AM) Colonoscopy 5-10 years ago Mammogram within last year Pap Smear 1-5 years ago  Allergies Davy Pique Bynum, CMA; 03/03/2016 9:01 AM) Clindamycin HCl (Bulk) *CHEMICALS* LevoFLOXacin *CHEMICALS* Moxifloxacin HCl *CHEMICALS* Spironolactone *DIURETICS*  Medication History (Sonya Bynum, CMA; 03/03/2016 9:02 AM) Furosemide (20MG Tablet, Oral) Active. Omeprazole (20MG Capsule DR, Oral) Active. Simvastatin (10MG Tablet, Oral) Active. Levothyroxine Sodium (88MCG Tablet, Oral) Active. Carvedilol (12.5MG Tablet, Oral) Active. Aspirin (81MG Tablet Chewable, Oral) Active. Medications Reconciled  Social History Marjean Donna, CMA; 03/03/2016 8:59 AM) Alcohol use Moderate alcohol use. Caffeine use Carbonated beverages, Tea. No drug use Tobacco use Never smoker.  Family History Marjean Donna, [place]; 03/03/2016  8:59 AM) Arthritis  Mother. Breast Cancer Mother. Heart Disease Mother. Heart disease in female family member before age 45  Pregnancy / Birth History Marjean Donna, [place]; 03/03/2016 8:59 AM) Age at menarche 6 years. Gravida 2 Maternal age 61-30 Para 2    Review of Systems ([place]; 03/03/2016 8:59 AM) General Present- Night Sweats. Not Present- Appetite Loss, Chills, Fatigue, Fever, Weight Gain and Weight Loss. Skin Not Present- Change in Wart/Mole, Dryness, Hives, Jaundice, New Lesions, Non-Healing Wounds, Rash and Ulcer. HEENT Present- Seasonal Allergies. Not Present- Earache, Hearing Loss, Hoarseness, Nose Bleed, Oral Ulcers, Ringing in the Ears, Sinus Pain, Sore Throat, Visual Disturbances, Wears glasses/contact lenses and Yellow Eyes. Breast Present- Breast Mass. Not Present- Breast Pain, Nipple Discharge and Skin Changes. Cardiovascular Not Present- Chest Pain, Difficulty Breathing Lying Down, Leg Cramps, Palpitations, Rapid Heart Rate, Shortness of Breath and Swelling of Extremities. Gastrointestinal Present- Hemorrhoids. Not Present- Abdominal Pain, Bloating, Bloody Stool, Change in Bowel Habits, Chronic diarrhea, Constipation, Difficulty Swallowing, Excessive gas, Gets full quickly at meals, Indigestion, Nausea, Rectal Pain and Vomiting. Female Genitourinary Not Present- Frequency, Nocturia, Painful Urination, Pelvic Pain and Urgency. Neurological Not Present- Decreased Memory, Fainting, Headaches, Numbness, Seizures, Tingling, Tremor, Trouble walking and Weakness. Psychiatric Not Present- Anxiety, Bipolar, Change in Sleep Pattern, Depression, Fearful and Frequent crying. Endocrine Present- Cold Intolerance. Not Present- Excessive Hunger, Hair Changes, Heat Intolerance, Hot flashes and New Diabetes. Hematology Present- Easy Bruising. Not Present- Excessive bleeding, Gland problems, HIV and Persistent Infections.  Vitals (Sonya Bynum CMA; 03/03/2016 9:00 AM) 03/03/2016 9:00 AM Weight: 203  lb Height: 67in Body Surface Area: 2.03 m Body Mass Index: 31.79 kg/m  Temp.: 11F(Temporal)  Pulse: 81 (Regular)  BP: 128/78 (Sitting, Left Arm, Standard)   Physical Exam  General: WN AA F alert and generally healthy appearing. HEENT: Normal. Pupils equal.  Neck: Supple. No mass. No thyroid mass. Lymph Nodes: No supraclavicular or cervical nodes.  Lungs: Clear to auscultation and symmetric breath sounds. Heart: RRR. No murmur or rub. ICD in left upper chest  Breasts: Right - no mass or nodule  Left - 2.5 cm mass just above the left nipple  Abdomen: Soft. No mass. No tenderness. No hernia. Normal bowel sounds. No abdominal scars.  Extremities: Good strength and ROM in upper and lower extremities.  Neurologic: Grossly intact to motor and sensory function. Psychiatric: Has normal mood and affect. Behavior is normal.  Assessment & Plan  1.  BREAST CANCER, STAGE 2, LEFT (C50.912)  Story: Biopsy of left breast - 02/24/2016 - 2.4 cm invasive ductal ca of the left breast, 11 o'clock position.   ER - 0%, PR - 0%, Ki67 - 59%, and Her2Neu pending  Impression: Plan:   1)  Cardiac clearance by Dr. Ena Dawley - she is intermediate risk.   2) Medical and radiation oncology consult   3) Genetics couseling   4) Will talk to patient after these consults  Addendum Note(Lanyah Spengler H. [name] MD; 03/15/2016 3:44 PM) Cardiac clearance by Dr. Ena Dawley - she is intermediate risk.  Addendum Note(Shuree Brossart H. [name] MD; 03/15/2016 4:24 PM) I spoke to Dr. Lindi Adie. He is planning to go ahead with chemotx. I talked to the patient about going ahead with a left breast lumpectomy (seed), left axillary SLNBx, and power port placement.  2. Cardiomyopathy and bradycardia status post ICD implantation  She is followed by Dr. Liane Comber. Dr. [name] placed ICD. Her first was implantation was 2009, she had a replacement Mar 2016. History of  CHF. Her last echo was 12/09/2015 - EF 35  - 40% 3. TIA - 2008 4. Colonoscopy - 2010 5. Thyroidectomy - 01/18/2000 [facility]  For multinodular goiter   [name], MD, [facility] Surgery Pager: [phone] Office phone:  [phone]

## 2016-04-05 ENCOUNTER — Ambulatory Visit (HOSPITAL_COMMUNITY)
Admission: RE | Admit: 2016-04-05 | Discharge: 2016-04-05 | Disposition: A | Discharge status: Home / Self Care | Payer: BLUE CROSS/BLUE SHIELD | Source: Ambulatory Visit | Attending: Surgery | Admitting: Surgery

## 2016-04-05 ENCOUNTER — Ambulatory Visit (HOSPITAL_COMMUNITY): Payer: BLUE CROSS/BLUE SHIELD | Admitting: Vascular Surgery

## 2016-04-05 ENCOUNTER — Encounter (HOSPITAL_COMMUNITY)
Admission: RE | Disposition: A | Discharge status: Home / Self Care | Payer: Self-pay | Source: Ambulatory Visit | Attending: Surgery

## 2016-04-05 ENCOUNTER — Ambulatory Visit (HOSPITAL_COMMUNITY): Payer: BLUE CROSS/BLUE SHIELD

## 2016-04-05 ENCOUNTER — Ambulatory Visit (HOSPITAL_COMMUNITY): Payer: BLUE CROSS/BLUE SHIELD | Admitting: Certified Registered"

## 2016-04-05 ENCOUNTER — Ambulatory Visit
Admission: RE | Admit: 2016-04-05 | Discharge: 2016-04-05 | Disposition: A | Discharge status: Home / Self Care | Payer: BLUE CROSS/BLUE SHIELD | Source: Ambulatory Visit | Attending: Surgery | Admitting: Surgery

## 2016-04-05 ENCOUNTER — Encounter (HOSPITAL_COMMUNITY): Payer: Self-pay | Admitting: Surgery

## 2016-04-05 DIAGNOSIS — K219 Gastro-esophageal reflux disease without esophagitis: Secondary | ICD-10-CM | POA: Insufficient documentation

## 2016-04-05 DIAGNOSIS — C50912 Malignant neoplasm of unspecified site of left female breast: Secondary | ICD-10-CM

## 2016-04-05 DIAGNOSIS — Z7982 Long term (current) use of aspirin: Secondary | ICD-10-CM | POA: Diagnosis not present

## 2016-04-05 DIAGNOSIS — Z8673 Personal history of transient ischemic attack (TIA), and cerebral infarction without residual deficits: Secondary | ICD-10-CM | POA: Diagnosis not present

## 2016-04-05 DIAGNOSIS — Z452 Encounter for adjustment and management of vascular access device: Secondary | ICD-10-CM | POA: Diagnosis not present

## 2016-04-05 DIAGNOSIS — C50212 Malignant neoplasm of upper-inner quadrant of left female breast: Secondary | ICD-10-CM | POA: Diagnosis not present

## 2016-04-05 DIAGNOSIS — R928 Other abnormal and inconclusive findings on diagnostic imaging of breast: Secondary | ICD-10-CM | POA: Diagnosis not present

## 2016-04-05 DIAGNOSIS — Z95810 Presence of automatic (implantable) cardiac defibrillator: Secondary | ICD-10-CM | POA: Diagnosis not present

## 2016-04-05 DIAGNOSIS — Z79899 Other long term (current) drug therapy: Secondary | ICD-10-CM | POA: Diagnosis not present

## 2016-04-05 DIAGNOSIS — M1990 Unspecified osteoarthritis, unspecified site: Secondary | ICD-10-CM | POA: Diagnosis not present

## 2016-04-05 DIAGNOSIS — Z171 Estrogen receptor negative status [ER-]: Secondary | ICD-10-CM | POA: Insufficient documentation

## 2016-04-05 DIAGNOSIS — G8918 Other acute postprocedural pain: Secondary | ICD-10-CM | POA: Diagnosis not present

## 2016-04-05 DIAGNOSIS — I509 Heart failure, unspecified: Secondary | ICD-10-CM | POA: Insufficient documentation

## 2016-04-05 DIAGNOSIS — Z95828 Presence of other vascular implants and grafts: Secondary | ICD-10-CM

## 2016-04-05 DIAGNOSIS — Z419 Encounter for procedure for purposes other than remedying health state, unspecified: Secondary | ICD-10-CM

## 2016-04-05 HISTORY — PX: PORTACATH PLACEMENT: SHX2246

## 2016-04-05 HISTORY — PX: BREAST LUMPECTOMY WITH RADIOACTIVE SEED AND SENTINEL LYMPH NODE BIOPSY: SHX6550

## 2016-04-05 HISTORY — PX: BREAST LUMPECTOMY: SHX2

## 2016-04-05 SURGERY — BREAST LUMPECTOMY WITH RADIOACTIVE SEED AND SENTINEL LYMPH NODE BIOPSY
Anesthesia: General | Site: Breast | Laterality: Right

## 2016-04-05 MED ORDER — 0.9 % SODIUM CHLORIDE (POUR BTL) OPTIME
TOPICAL | Status: DC | PRN
  Administered 2016-04-05: 1000 mL

## 2016-04-05 MED ORDER — MIDAZOLAM HCL 2 MG/2ML IJ SOLN
INTRAMUSCULAR | Status: AC
  Filled 2016-04-05: qty 2

## 2016-04-05 MED ORDER — LIDOCAINE 2% (20 MG/ML) 5 ML SYRINGE
INTRAMUSCULAR | Status: AC
  Filled 2016-04-05: qty 5

## 2016-04-05 MED ORDER — PROPOFOL 10 MG/ML IV BOLUS
INTRAVENOUS | Status: DC | PRN
Start: 2016-04-05 — End: 2016-04-05
  Administered 2016-04-05: 160 mg via INTRAVENOUS

## 2016-04-05 MED ORDER — FENTANYL CITRATE (PF) 100 MCG/2ML IJ SOLN: 25.0000 ug | INTRAMUSCULAR | Status: DC | PRN

## 2016-04-05 MED ORDER — DEXAMETHASONE SODIUM PHOSPHATE 10 MG/ML IJ SOLN
INTRAMUSCULAR | Status: AC
  Filled 2016-04-05: qty 1

## 2016-04-05 MED ORDER — ONDANSETRON HCL 4 MG/2ML IJ SOLN
INTRAMUSCULAR | Status: AC
  Filled 2016-04-05: qty 2

## 2016-04-05 MED ORDER — ONDANSETRON HCL 4 MG/2ML IJ SOLN
INTRAMUSCULAR | Status: DC | PRN
  Administered 2016-04-05 (×2): 4 mg via INTRAVENOUS

## 2016-04-05 MED ORDER — PROPOFOL 10 MG/ML IV BOLUS
INTRAVENOUS | Status: AC
  Filled 2016-04-05: qty 20

## 2016-04-05 MED ORDER — HEPARIN SOD (PORK) LOCK FLUSH 100 UNIT/ML IV SOLN
INTRAVENOUS | Status: AC
  Filled 2016-04-05: qty 5

## 2016-04-05 MED ORDER — LACTATED RINGERS IV SOLN
INTRAVENOUS | Status: DC | PRN
  Administered 2016-04-05 (×2): via INTRAVENOUS

## 2016-04-05 MED ORDER — LIDOCAINE HCL (CARDIAC) 20 MG/ML IV SOLN
INTRAVENOUS | Status: DC | PRN
Start: 2016-04-05 — End: 2016-04-05
  Administered 2016-04-05: 40 mg via INTRAVENOUS

## 2016-04-05 MED ORDER — SODIUM CHLORIDE 0.9 % IV SOLN
INTRAVENOUS | Status: DC | PRN
  Administered 2016-04-05: 08:00:00

## 2016-04-05 MED ORDER — DEXAMETHASONE SODIUM PHOSPHATE 10 MG/ML IJ SOLN
INTRAMUSCULAR | Status: DC | PRN
  Administered 2016-04-05: 5 mg via INTRAVENOUS

## 2016-04-05 MED ORDER — BUPIVACAINE-EPINEPHRINE 0.25% -1:200000 IJ SOLN
INTRAMUSCULAR | Status: DC | PRN
  Administered 2016-04-05: 20 mL

## 2016-04-05 MED ORDER — FENTANYL CITRATE (PF) 250 MCG/5ML IJ SOLN
INTRAMUSCULAR | Status: AC
  Filled 2016-04-05: qty 5

## 2016-04-05 MED ORDER — CHLORHEXIDINE GLUCONATE 4 % EX LIQD: 1.0000 "application " | Freq: Once | CUTANEOUS | Status: DC

## 2016-04-05 MED ORDER — ROCURONIUM BROMIDE 50 MG/5ML IV SOLN
INTRAVENOUS | Status: AC
  Filled 2016-04-05: qty 1

## 2016-04-05 MED ORDER — BUPIVACAINE-EPINEPHRINE (PF) 0.25% -1:200000 IJ SOLN
INTRAMUSCULAR | Status: AC
  Filled 2016-04-05: qty 30

## 2016-04-05 MED ORDER — PHENYLEPHRINE 40 MCG/ML (10ML) SYRINGE FOR IV PUSH (FOR BLOOD PRESSURE SUPPORT)
PREFILLED_SYRINGE | INTRAVENOUS | Status: AC
  Filled 2016-04-05: qty 10

## 2016-04-05 MED ORDER — FENTANYL CITRATE (PF) 100 MCG/2ML IJ SOLN
INTRAMUSCULAR | Status: DC | PRN
  Administered 2016-04-05 (×3): 25 ug via INTRAVENOUS
  Administered 2016-04-05: 50 ug via INTRAVENOUS
  Administered 2016-04-05 (×5): 25 ug via INTRAVENOUS

## 2016-04-05 MED ORDER — METHYLENE BLUE 0.5 % INJ SOLN
INTRAVENOUS | Status: AC
  Filled 2016-04-05: qty 10

## 2016-04-05 MED ORDER — HYDROCODONE-ACETAMINOPHEN 5-325 MG PO TABS: 1.0000 | ORAL_TABLET | Freq: Four times a day (QID) | ORAL | Status: DC | PRN

## 2016-04-05 MED ORDER — ONDANSETRON HCL 4 MG/2ML IJ SOLN: 4.0000 mg | Freq: Once | INTRAMUSCULAR | Status: DC | PRN

## 2016-04-05 MED ORDER — TECHNETIUM TC 99M SULFUR COLLOID FILTERED
1.0000 | Freq: Once | INTRAVENOUS | Status: AC | PRN
  Administered 2016-04-05: 1 via INTRADERMAL

## 2016-04-05 MED ORDER — PHENYLEPHRINE HCL 10 MG/ML IJ SOLN
INTRAMUSCULAR | Status: DC | PRN
  Administered 2016-04-05 (×2): 80 ug via INTRAVENOUS
  Administered 2016-04-05: 40 ug via INTRAVENOUS
  Administered 2016-04-05 (×2): 80 ug via INTRAVENOUS

## 2016-04-05 MED ORDER — HEPARIN SOD (PORK) LOCK FLUSH 100 UNIT/ML IV SOLN
INTRAVENOUS | Status: DC | PRN
  Administered 2016-04-05: 500 [IU] via INTRAVENOUS

## 2016-04-05 MED ORDER — MIDAZOLAM HCL 5 MG/5ML IJ SOLN
INTRAMUSCULAR | Status: DC | PRN
  Administered 2016-04-05: 2 mg via INTRAVENOUS

## 2016-04-05 MED ORDER — LIDOCAINE HCL (PF) 1 % IJ SOLN
INTRAMUSCULAR | Status: AC
  Filled 2016-04-05: qty 30

## 2016-04-05 MED ORDER — SODIUM CHLORIDE 0.9 % IJ SOLN
INTRAMUSCULAR | Status: AC
  Filled 2016-04-05: qty 10

## 2016-04-05 SURGICAL SUPPLY — 65 items
APPLIER CLIP 9.375 MED OPEN (MISCELLANEOUS) ×3
BAG DECANTER FOR FLEXI CONT (MISCELLANEOUS) ×3 IMPLANT
BENZOIN TINCTURE PRP APPL 2/3 (GAUZE/BANDAGES/DRESSINGS) ×3 IMPLANT
BINDER BREAST LRG (GAUZE/BANDAGES/DRESSINGS) IMPLANT
BINDER BREAST XLRG (GAUZE/BANDAGES/DRESSINGS) ×3 IMPLANT
BLADE SURG 15 STRL LF DISP TIS (BLADE) ×2 IMPLANT
BLADE SURG 15 STRL SS (BLADE) ×1
CANISTER SUCTION 2500CC (MISCELLANEOUS) ×3 IMPLANT
CHLORAPREP W/TINT 10.5 ML (MISCELLANEOUS) ×3 IMPLANT
CHLORAPREP W/TINT 26ML (MISCELLANEOUS) ×3 IMPLANT
CLIP APPLIE 9.375 MED OPEN (MISCELLANEOUS) ×2 IMPLANT
CLIP TI WIDE RED SMALL 6 (CLIP) ×3 IMPLANT
COVER PROBE W GEL 5X96 (DRAPES) ×3 IMPLANT
COVER SURGICAL LIGHT HANDLE (MISCELLANEOUS) ×6 IMPLANT
COVER TRANSDUCER ULTRASND GEL (DRAPE) ×3 IMPLANT
COVER ULTRASOUND SURGICAL (MISCELLANEOUS) ×3 IMPLANT
CRADLE DONUT ADULT HEAD (MISCELLANEOUS) ×3 IMPLANT
DRAPE C-ARM 42X72 X-RAY (DRAPES) ×3 IMPLANT
DRAPE CHEST BREAST 15X10 FENES (DRAPES) ×6 IMPLANT
DRAPE UTILITY XL STRL (DRAPES) ×6 IMPLANT
ELECT CAUTERY BLADE 6.4 (BLADE) ×3 IMPLANT
ELECT COATED BLADE 2.86 ST (ELECTRODE) ×6 IMPLANT
ELECT REM PT RETURN 9FT ADLT (ELECTROSURGICAL) ×3
ELECTRODE REM PT RTRN 9FT ADLT (ELECTROSURGICAL) ×2 IMPLANT
GAUZE SPONGE 4X4 12PLY STRL (GAUZE/BANDAGES/DRESSINGS) ×3 IMPLANT
GAUZE SPONGE 4X4 16PLY XRAY LF (GAUZE/BANDAGES/DRESSINGS) ×3 IMPLANT
GEL ULTRASOUND 20GR AQUASONIC (MISCELLANEOUS) IMPLANT
GLOVE SURG SIGNA 7.5 PF LTX (GLOVE) ×6 IMPLANT
GLOVE SURG SS PI 6.5 STRL IVOR (GLOVE) ×3 IMPLANT
GOWN STRL REUS W/ TWL LRG LVL3 (GOWN DISPOSABLE) ×4 IMPLANT
GOWN STRL REUS W/ TWL XL LVL3 (GOWN DISPOSABLE) ×4 IMPLANT
GOWN STRL REUS W/TWL LRG LVL3 (GOWN DISPOSABLE) ×2
GOWN STRL REUS W/TWL XL LVL3 (GOWN DISPOSABLE) ×2
INTRODUCER 13FR (MISCELLANEOUS) IMPLANT
KIT BASIN OR (CUSTOM PROCEDURE TRAY) ×3 IMPLANT
KIT MARKER MARGIN INK (KITS) ×3 IMPLANT
KIT PORT POWER 8FR ISP CVUE (Catheter) ×3 IMPLANT
KIT ROOM TURNOVER OR (KITS) ×3 IMPLANT
LIQUID BAND (GAUZE/BANDAGES/DRESSINGS) ×6 IMPLANT
NDL SAFETY ECLIPSE 18X1.5 (NEEDLE) IMPLANT
NEEDLE FILTER BLUNT 18X 1/2SAF (NEEDLE)
NEEDLE FILTER BLUNT 18X1 1/2 (NEEDLE) IMPLANT
NEEDLE HYPO 18GX1.5 SHARP (NEEDLE)
NEEDLE HYPO 25GX1X1/2 BEV (NEEDLE) ×3 IMPLANT
NEEDLE HYPO 25X1 1.5 SAFETY (NEEDLE) ×3 IMPLANT
NS IRRIG 1000ML POUR BTL (IV SOLUTION) ×3 IMPLANT
PACK SURGICAL SETUP 50X90 (CUSTOM PROCEDURE TRAY) ×3 IMPLANT
PAD ARMBOARD 7.5X6 YLW CONV (MISCELLANEOUS) ×3 IMPLANT
PENCIL BUTTON HOLSTER BLD 10FT (ELECTRODE) ×6 IMPLANT
SET SHEATH INTRODUCER 10FR (MISCELLANEOUS) IMPLANT
SHEATH COOK PEEL AWAY SET 8F (SHEATH) ×3 IMPLANT
SHEATH COOK PEEL AWAY SET 9F (SHEATH) ×3 IMPLANT
SPONGE LAP 18X18 X RAY DECT (DISPOSABLE) ×3 IMPLANT
STRIP CLOSURE SKIN 1/4X4 (GAUZE/BANDAGES/DRESSINGS) ×3 IMPLANT
SUT MNCRL AB 4-0 PS2 18 (SUTURE) ×9 IMPLANT
SUT VIC AB 3-0 SH 18 (SUTURE) ×3 IMPLANT
SUT VIC AB 3-0 SH 8-18 (SUTURE) ×3 IMPLANT
SYR 20ML ECCENTRIC (SYRINGE) ×6 IMPLANT
SYR 5ML LUER SLIP (SYRINGE) ×3 IMPLANT
SYR BULB 3OZ (MISCELLANEOUS) ×3 IMPLANT
SYR CONTROL 10ML LL (SYRINGE) ×3 IMPLANT
TOWEL OR 17X24 6PK STRL BLUE (TOWEL DISPOSABLE) ×3 IMPLANT
TOWEL OR 17X26 10 PK STRL BLUE (TOWEL DISPOSABLE) ×3 IMPLANT
TUBE CONNECTING 12X1/4 (SUCTIONS) ×3 IMPLANT
YANKAUER SUCT BULB TIP NO VENT (SUCTIONS) ×3 IMPLANT

## 2016-04-05 NOTE — Anesthesia Postprocedure Evaluation (Signed)
Anesthesia Post Note  Patient: [name] [name]  Procedure(s) Performed: Procedure(s) (LRB): BREAST LUMPECTOMY WITH RADIOACTIVE SEED AND SENTINEL LYMPH NODE BIOPSY (Left) INSERTION PORT-A-CATH WITH [place] (Right)  Patient location during evaluation: PACU Anesthesia Type: General and Regional Level of consciousness: awake, awake and alert and oriented Pain management: pain level controlled Vital Signs Assessment: post-procedure vital signs reviewed and stable Respiratory status: spontaneous breathing, respiratory function stable and nonlabored ventilation Cardiovascular status: blood pressure returned to baseline Anesthetic complications: no    Last Vitals:  Filed Vitals:   04/05/16 1045 04/05/16 1130  BP:    Pulse: 57   Temp:  36.4 C  Resp: 13     Last Pain:  Filed Vitals:   04/05/16 1147  PainSc: 3                  Laquita Harlan COKER

## 2016-04-05 NOTE — Op Note (Signed)
04/05/2016  10:10 AM  PATIENT:  [name] [name] DOB: 07/25/1961 MRN: [id number]  PREOP DIAGNOSIS:  LEFT BREAST CANCER  POSTOP DIAGNOSIS:   Left breast cancer, 12 o'clock position (T2, N0)  PROCEDURE:   Procedure(s): BREAST LUMPECTOMY WITH RADIOACTIVE SEED AND SENTINEL LYMPH NODE BIOPSY, INSERTION PORT-A-CATH in right IJ WITH [place]  SURGEON:   [name], M.D.  ANESTHESIA:   general  Anesthesiologist: Roberts Gaudy, MD CRNA: Shirlyn Goltz, CRNA; Freddie Breech, CRNA  General  EBL:  100  ml  DRAINS: none   LOCAL MEDICATIONS USED:   20 cc 1/4% marcaine, left pectoral block by anesthesia  SPECIMEN:   Left breast lumpectomy, left axillary sentinel lymph node (counts 240, background 5)  COUNTS CORRECT:  YES  INDICATIONS FOR PROCEDURE:  [name] [name] is a 55 y.o. (DOB: 1961-03-25) AA  female whose primary care physician is Terald Sleeper, PA-C and comes for left breast lumpectomy and left axillary sentinel lymph node biopsy.   She has seen Drs. Remus Blake for oncology.  Because her tumor is triple negative, there are plans to give her chemotx.   The options for breast cancer treatment have been discussed with the patient. She elected to proceed with lumpectomy and axillary sentinel lymph node.     The indications and potential complications of surgery were explained to the patient. Potential complications include, but are not limited to, bleeding, infection, the need for further surgery, and nerve injury.     She had a I131 seed placed on 04/01/2016 in her left breast at [facility].  I confirmed the presence of the I131 seed in the pre op area using the Neoprobe.  The seed is in the 12 o'clock position of the left breast, just above the areola.   In the holding area, her left areola was injected with 1 millicurie of Technitium Sulfur Colloid.  OPERATIVE NOTE:   The patient was taken to room # 9 at [facility] where she underwent a general anesthesia  supervised by  Anesthesiologist: Roberts Gaudy, MD CRNA: Shirlyn Goltz, CRNA; Freddie Breech, CRNA. Her left breast and axilla were prepped with  ChloraPrep and sterilely draped.    A time-out and the surgical check list was reviewed.  A magnet was placed over the left upper chest ICD.   I turned attention to the cancer which was about at the 12 o'clock position of the left breast, just above the areola.   I used the Neoprobe to identify the I131 seed.  I tried to excise an area around the tumor of at least 1 cm.    I excised this block of breast tissue approximately 5 cm by 5 cm  in diameter.   I took the dissection of the lumpectomy to the chest wall.   I painted the lumpectomy specimen with the 6 color paint kit and did a specimen mammogram which confirmed the mass, clip, and the seed were all in the right position in the specimen.  The specimen was sent to pathology who called back to confirm that they have the seed and the specimen.   I then started the left axillary sentinel lymph node biopsy. I made an incision in the left axilla.  I found a hot area at the junction of the breast and the pectoralis major muscle. I cut down and  identified a hot node that had counts of 240 and the background has 5 counts.  I checked her internal  mammary nodes and supraclavicular nodes with the neoprobe.  She did have one area in her medial breast (internal mammary node?), about the 4th intercostal space that was warm with counts of 15 to 20.. The axillary node was then sent to pathology.    I then irrigated the wound with saline. I infiltrated approximately 20 mL of 1/4% marcaine between the incisions.  I placed 6 clips to mark biopsy cavity, at 12, 3, 6, and 9 o'clock. Two clips were placed on the pectoralis major.   I then closed all the wounds in layers using 3-0 Vicryl sutures for the deep layer. At the skin, I closed the incisions with a 4-0 Monocryl suture. The incisions were then painted with LiquiBand.     I then  repositioned and redraped the patient for the power port placement.   A time-out and the surgical check list was reviewed.  A magnet was placed over the left upper chest ICD.   The patient was placed in Trendelenburg position.  First, I accessed the right subclavian vein which I hit of the first stick.  I placed the dilator, but could not advance the 8 F tubing.  I removed the dilator and tubing and went for the right IJ.   I used an [place] to find the right IJ.  The internal jugular vein was accessed with a 16 gauge needle and a guide wire threaded through the needle into the vein.  The position of the wire was checked with fluoroscopy.   I then developed a pocket in the upper inner aspect of the right chest for the port reservoir.  I used the [employer] for venous access.  The reservoir was sewn in place with a 3-0 Vicryl suture.  The reservoir had been flushed with dilute (10 units/cc) heparin.   I then passed the silastic tubing from the reservoir incision to the subclavian incision to the right internal jugular stick site and used the 9 French introducer to pass the catheter into the right IJ vein.  The tip of the silastic catheter was position at the junction of the SVC and the right atrium under fluoroscopy.  The silastic catheter was then attached to the port with the bayonet device.     The entire port and tubing were checked with fluoroscopy and then the port was flushed with 4 cc of concentrated heparin (100 units/cc).   The wounds were then closed with 3-0 vicryl subcutaneous sutures and the skin closed with a 5-0 Monocryl suture.  The skin was painted with liquidband.  Sponge and needle count were correct at the end of the case.   She had gauze place over the wounds and placed in a breast binder.   The patient tolerated the procedure well, was transported to the recovery room in good condition. Sponge and needle count were correct at the end of the case.   Final pathology is  pending.  A CXR will be obtained in the recovery room.   [name], MD, [facility] Surgery Pager: [phone] Office phone:  [phone]

## 2016-04-05 NOTE — Discharge Instructions (Signed)
[facility] - DISCHARGE INSTRUCTIONS TO PATIENT  Activity:  Driving - May drive in 2 or 3 days, if not taking pain meds.   Lifting - No lifting more than 15 pounds for one week, then no limit  Wound Care:   Leave incision dry for 2 days, then remove bandages and shower.      You may put an ice pack over the breast incision.  Diet:  As tolerated.  Follow up appointment:  Call Dr. Pollie Friar office [facility]) at [phone] for an appointment in 2 to 3 weeks.  Medications and dosages:  Resume your home medications.  You have a prescription for:  Vicodin.  Call Dr. [name] or his office  [phone]) if you have:  Temperature greater than 100.4,  Persistent nausea and vomiting,  Severe uncontrolled pain,  Redness, tenderness, or signs of infection (pain, swelling, redness, odor or green/yellow discharge around the site),  Difficulty breathing, headache or visual disturbances,  Any other questions or concerns you may have after discharge.  In an emergency, call 911 or go to an Emergency Department at a nearby hospital.

## 2016-04-05 NOTE — Transfer of Care (Signed)
Immediate Anesthesia Transfer of Care Note  Patient: [name] [name]  Procedure(s) Performed: Procedure(s) with comments: BREAST LUMPECTOMY WITH RADIOACTIVE SEED AND SENTINEL LYMPH NODE BIOPSY (Left) INSERTION PORT-A-CATH WITH [place] (Right) - right IJ  Patient Location: PACU  Anesthesia Type:General  Level of Consciousness:  sedated, patient cooperative and responds to stimulation  Airway & Oxygen Therapy:Patient Spontanous Breathing and Patient connected to face mask oxgen  Post-op Assessment:  Report given to PACU RN and Post -op Vital signs reviewed and stable  Post vital signs:  Reviewed and stable  Last Vitals:  Filed Vitals:   04/05/16 0613  BP: 149/70  Pulse: 56  Temp: 36.9 C  Resp: 18    Complications: No apparent anesthesia complications

## 2016-04-05 NOTE — Progress Notes (Signed)
Nurse paged [facility] Corene Cornea returned page and informed Nurse that a magnet could be placed over ICD during surgery, and a representative would be at [facility] today IF needed. Will inform Anesthesia of this.

## 2016-04-05 NOTE — Progress Notes (Signed)
[name] [name] ([place]) called Nurse and informed Nurse that he could be reached at [phone] today IF needed.

## 2016-04-05 NOTE — Anesthesia Preprocedure Evaluation (Addendum)
Anesthesia Evaluation  Patient identified by MRN, date of birth, ID band Patient awake    Reviewed: Allergy & Precautions, NPO status , Patient's Chart, lab work & pertinent test results  Airway Mallampati: II  TM Distance: >3 FB Neck ROM: Full    Dental  (+) Edentulous Upper, Edentulous Lower   Pulmonary    breath sounds clear to auscultation       Cardiovascular  Rhythm:Regular Rate:Normal     Neuro/Psych    GI/Hepatic   Endo/Other    Renal/GU      Musculoskeletal   Abdominal   Peds  Hematology   Anesthesia Other Findings   Reproductive/Obstetrics                             Anesthesia Physical Anesthesia Plan  ASA: III  Anesthesia Plan: General and Regional   Post-op Pain Management:    Induction: Intravenous  Airway Management Planned: LMA  Additional Equipment:   Intra-op Plan:   Post-operative Plan:   Informed Consent: I have reviewed the patients History and Physical, chart, labs and discussed the procedure including the risks, benefits and alternatives for the proposed anesthesia with the patient or authorized representative who has indicated his/her understanding and acceptance.     Plan Discussed with: Anesthesiologist and CRNA  Anesthesia Plan Comments:        Anesthesia Quick Evaluation

## 2016-04-05 NOTE — Interval H&P Note (Signed)
History and Physical Interval Note:  04/05/2016 6:50 AM  [name] [name]  has presented today for surgery, with the diagnosis of LEFT BREAST CANCER  The various methods of treatment have been discussed with the patient and family.  Sister and daughter, [name] [name], in room.  After consideration of risks, benefits and other options for treatment, the patient has consented to  Procedure(s): BREAST LUMPECTOMY WITH RADIOACTIVE SEED AND SENTINEL LYMPH NODE BIOPSY (Left) INSERTION PORT-A-CATH WITH [place] (N/A) as a surgical intervention .  The patient's history has been reviewed, patient examined, no change in status, stable for surgery.  I have reviewed the patient's chart and labs.  Questions were answered to the patient's satisfaction.     Janmarie Smoot H

## 2016-04-05 NOTE — Anesthesia Procedure Notes (Addendum)
Anesthesia Regional Block:  Pectoralis block  Pre-Anesthetic Checklist: ,, timeout performed, Correct Patient, Correct Site, Correct Laterality, Correct Procedure, Correct Position, site marked, Risks and benefits discussed,  Surgical consent,  Pre-op evaluation,  At surgeon's request and post-op pain management  Laterality: Left  Prep: chloraprep       Needles:  Injection technique: Single-shot  Needle Type: Echogenic Stimulator Needle     Needle Length: 9cm 9 cm     Additional Needles:  Procedures: ultrasound guided (picture in chart) Pectoralis block Narrative:  Start time: 04/05/2016 7:10 AM End time: 04/05/2016 7:15 AM Injection made incrementally with aspirations every 5 mL.  Performed by: Personally   Additional Notes: 30 cc 0.5% Bupivacaine injected easily   Procedure Name: LMA Insertion Date/Time: 04/05/2016 8:17 AM Performed by: Freddie Breech Pre-anesthesia Checklist: Patient identified, Emergency Drugs available, Suction available, Patient being monitored and Timeout performed Patient Re-evaluated:Patient Re-evaluated prior to inductionOxygen Delivery Method: Circle system utilized Preoxygenation: Pre-oxygenation with 100% oxygen Intubation Type: IV induction LMA: LMA inserted LMA Size: 4.0 Number of attempts: 1 Airway Equipment and Method: Patient positioned with wedge pillow Placement Confirmation: positive ETCO2,  CO2 detector and breath sounds checked- equal and bilateral Tube secured with: Tape Dental Injury: Teeth and Oropharynx as per pre-operative assessment

## 2016-04-06 ENCOUNTER — Encounter (HOSPITAL_COMMUNITY): Payer: Self-pay | Admitting: Surgery

## 2016-04-06 LAB — CUP PACEART REMOTE DEVICE CHECK
Brady Statistic RA Percent Paced: 1 %
Brady Statistic RV Percent Paced: 0 %
Date Time Interrogation Session: 20170614091106
HIGH POWER IMPEDANCE MEASURED VALUE: 49 Ohm
Implantable Lead Implant Date: 20090320
Implantable Lead Model: 158
Implantable Lead Model: 5076
Implantable Lead Serial Number: 182504
Lead Channel Sensing Intrinsic Amplitude: 15.2 mV
MDC IDC LEAD IMPLANT DT: 20090320
MDC IDC LEAD LOCATION: 753859
MDC IDC LEAD LOCATION: 753860
MDC IDC MSMT LEADCHNL RA IMPEDANCE VALUE: 571 Ohm
MDC IDC MSMT LEADCHNL RA SENSING INTR AMPL: 1.2 mV
MDC IDC MSMT LEADCHNL RV IMPEDANCE VALUE: 419 Ohm
MDC IDC PG SERIAL: 111826

## 2016-04-07 ENCOUNTER — Encounter (HOSPITAL_COMMUNITY): Payer: Self-pay

## 2016-04-11 ENCOUNTER — Ambulatory Visit
Admission: RE | Admit: 2016-04-11 | Discharge: 2016-04-11 | Disposition: A | Discharge status: Home / Self Care | Payer: BLUE CROSS/BLUE SHIELD | Source: Ambulatory Visit | Attending: Surgery | Admitting: Surgery

## 2016-04-11 ENCOUNTER — Other Ambulatory Visit: Payer: Self-pay | Admitting: Surgery

## 2016-04-11 DIAGNOSIS — R079 Chest pain, unspecified: Secondary | ICD-10-CM | POA: Diagnosis not present

## 2016-04-11 DIAGNOSIS — R52 Pain, unspecified: Secondary | ICD-10-CM

## 2016-04-12 ENCOUNTER — Ambulatory Visit (HOSPITAL_BASED_OUTPATIENT_CLINIC_OR_DEPARTMENT_OTHER): Payer: BLUE CROSS/BLUE SHIELD | Admitting: Hematology and Oncology

## 2016-04-12 ENCOUNTER — Emergency Department (HOSPITAL_COMMUNITY)
Admission: EM | Admit: 2016-04-12 | Discharge: 2016-04-12 | Disposition: A | Discharge status: Home / Self Care | Payer: BLUE CROSS/BLUE SHIELD | Attending: Emergency Medicine | Admitting: Emergency Medicine

## 2016-04-12 ENCOUNTER — Emergency Department (HOSPITAL_COMMUNITY): Payer: BLUE CROSS/BLUE SHIELD

## 2016-04-12 ENCOUNTER — Ambulatory Visit: Payer: Self-pay | Admitting: Surgery

## 2016-04-12 ENCOUNTER — Encounter (HOSPITAL_COMMUNITY): Payer: Self-pay | Admitting: Emergency Medicine

## 2016-04-12 ENCOUNTER — Encounter: Payer: Self-pay | Admitting: Hematology and Oncology

## 2016-04-12 ENCOUNTER — Encounter: Payer: Self-pay | Admitting: *Deleted

## 2016-04-12 ENCOUNTER — Other Ambulatory Visit: Payer: Self-pay

## 2016-04-12 VITALS — BP 153/91 | HR 63 | Temp 98.6°F | Resp 19 | Wt 199.8 lb

## 2016-04-12 DIAGNOSIS — M7989 Other specified soft tissue disorders: Secondary | ICD-10-CM | POA: Diagnosis not present

## 2016-04-12 DIAGNOSIS — I509 Heart failure, unspecified: Secondary | ICD-10-CM | POA: Diagnosis not present

## 2016-04-12 DIAGNOSIS — R52 Pain, unspecified: Secondary | ICD-10-CM | POA: Diagnosis not present

## 2016-04-12 DIAGNOSIS — R229 Localized swelling, mass and lump, unspecified: Secondary | ICD-10-CM | POA: Diagnosis not present

## 2016-04-12 DIAGNOSIS — R110 Nausea: Secondary | ICD-10-CM | POA: Diagnosis not present

## 2016-04-12 DIAGNOSIS — M79602 Pain in left arm: Secondary | ICD-10-CM | POA: Diagnosis not present

## 2016-04-12 DIAGNOSIS — C50212 Malignant neoplasm of upper-inner quadrant of left female breast: Secondary | ICD-10-CM

## 2016-04-12 DIAGNOSIS — Z853 Personal history of malignant neoplasm of breast: Secondary | ICD-10-CM | POA: Insufficient documentation

## 2016-04-12 DIAGNOSIS — Z8673 Personal history of transient ischemic attack (TIA), and cerebral infarction without residual deficits: Secondary | ICD-10-CM | POA: Diagnosis not present

## 2016-04-12 DIAGNOSIS — Z7982 Long term (current) use of aspirin: Secondary | ICD-10-CM | POA: Insufficient documentation

## 2016-04-12 DIAGNOSIS — R0789 Other chest pain: Secondary | ICD-10-CM | POA: Diagnosis not present

## 2016-04-12 DIAGNOSIS — R609 Edema, unspecified: Secondary | ICD-10-CM

## 2016-04-12 DIAGNOSIS — Z95810 Presence of automatic (implantable) cardiac defibrillator: Secondary | ICD-10-CM | POA: Insufficient documentation

## 2016-04-12 DIAGNOSIS — R079 Chest pain, unspecified: Secondary | ICD-10-CM | POA: Diagnosis not present

## 2016-04-12 LAB — CBC WITH DIFFERENTIAL/PLATELET
BASOS PCT: 0 %
Basophils Absolute: 0 10*3/uL (ref 0.0–0.1)
EOS ABS: 0.6 10*3/uL (ref 0.0–0.7)
EOS PCT: 7 %
HEMATOCRIT: 35 % — AB (ref 36.0–46.0)
Hemoglobin: 11.2 g/dL — ABNORMAL LOW (ref 12.0–15.0)
Lymphocytes Relative: 23 %
Lymphs Abs: 1.8 10*3/uL (ref 0.7–4.0)
MCH: 29.3 pg (ref 26.0–34.0)
MCHC: 32 g/dL (ref 30.0–36.0)
MCV: 91.6 fL (ref 78.0–100.0)
MONO ABS: 0.6 10*3/uL (ref 0.1–1.0)
MONOS PCT: 8 %
NEUTROS ABS: 4.9 10*3/uL (ref 1.7–7.7)
Neutrophils Relative %: 62 %
Platelets: 192 10*3/uL (ref 150–400)
RBC: 3.82 MIL/uL — ABNORMAL LOW (ref 3.87–5.11)
RDW: 12.6 % (ref 11.5–15.5)
WBC: 7.9 10*3/uL (ref 4.0–10.5)

## 2016-04-12 LAB — BASIC METABOLIC PANEL
Anion gap: 6 (ref 5–15)
BUN: 15 mg/dL (ref 6–20)
CALCIUM: 9.5 mg/dL (ref 8.9–10.3)
CHLORIDE: 106 mmol/L (ref 101–111)
CO2: 26 mmol/L (ref 22–32)
Creatinine, Ser: 0.9 mg/dL (ref 0.44–1.00)
GLUCOSE: 101 mg/dL — AB (ref 65–99)
Potassium: 4.4 mmol/L (ref 3.5–5.1)
Sodium: 138 mmol/L (ref 135–145)

## 2016-04-12 MED ORDER — HYDROMORPHONE HCL 4 MG/ML IJ SOLN
INTRAMUSCULAR | Status: AC
  Filled 2016-04-12: qty 1

## 2016-04-12 MED ORDER — LORAZEPAM 2 MG/ML IJ SOLN
0.5000 mg | Freq: Once | INTRAMUSCULAR | Status: AC
  Administered 2016-04-12: 0.5 mg via INTRAVENOUS
  Filled 2016-04-12: qty 1

## 2016-04-12 MED ORDER — HYDROMORPHONE HCL 4 MG/ML IJ SOLN
1.0000 mg | Freq: Once | INTRAMUSCULAR | Status: AC
  Administered 2016-04-12: 1 mg via INTRAMUSCULAR

## 2016-04-12 NOTE — ED Notes (Signed)
RN offered pt ride out in a wheelchair however she refused.

## 2016-04-12 NOTE — Progress Notes (Signed)
Patient Care Team: Terald Sleeper, PA-C as PCP - General ([place]) Nicholas Lose, MD as Consulting Physician (Hematology and Oncology) Kyung Rudd, MD as Consulting Physician (Radiation Oncology) [name], MD as Consulting Physician (General Surgery)  SUMMARY OF ONCOLOGIC HISTORY:   Breast cancer of upper-inner quadrant of left female breast ([place])   02/24/2016 Mammogram Left breast mass 11:00 position 4 cm from nipple, 2.4 x 1.6 x 1.9 cm, single axillary lymph node noted, T2 N0 stage IIA clinical stage ([place])   02/24/2016 Initial Diagnosis Moderate to poorly differentiated IDC, lymph node biopsy negative, ER 0%, PR 0%, HER-2, 2+ by IHC, fish negative, ratio 1.2, Ki-67 59%    04/05/2016 Surgery Left lumpectomy: IDC grade 3, 2.3 cm, associated high-grade DCIS, margins negative, 0/1 lymph node, T2 N0 stage II a, ER 0%, PR 0%, HER-2 negative ratio 1.38    Chemotherapy Patient refused adjuvant chemotherapy    CHIEF COMPLIANT: Intractable pain along the port  INTERVAL HISTORY: [name] [name] is a 55 year old with above-mentioned history of left breast cancer treated with lumpectomy and is here today to discuss the pathology report. She is complaining of severe pain along the port that is radiating into her right arm. She tells me that she is taking Motrin and Vicodin and pain is still continuing. The only thing that makes it better if she lies on her back. She denies any fevers or chills. She denies any pain related to the left lumpectomy.  REVIEW OF SYSTEMS:   Constitutional: Denies fevers, chills or abnormal weight loss Eyes: Denies blurriness of vision Ears, nose, mouth, throat, and face: Denies mucositis or sore throat Respiratory: Denies cough, dyspnea or wheezes Cardiovascular: Denies palpitation, chest discomfort Gastrointestinal:  Denies nausea, heartburn or change in bowel habits Skin: Denies abnormal skin rashes Lymphatics: Denies new lymphadenopathy or easy  bruising Neurological:Denies numbness, tingling or new weaknesses Behavioral/Psych: Mood is stable, no new changes  Extremities: No lower extremity edema Breast: Recent left lumpectomy, right-sided port discomfort All other systems were reviewed with the patient and are negative.  I have reviewed the past medical history, past surgical history, social history and family history with the patient and they are unchanged from previous note.  ALLERGIES:  is allergic to avelox; clindamycin/lincomycin; kiwi extract; bactrim; levofloxacin; spironolactone; and sulfa antibiotics.  MEDICATIONS:  Current Outpatient Prescriptions  Medication Sig Dispense Refill  . aspirin EC 81 MG tablet Take 162 mg by mouth daily.     . carvedilol (COREG) 12.5 MG tablet Take 1 tablet (12.5 mg total) by mouth 2 (two) times daily with a meal. 60 tablet 9  . HYDROcodone-acetaminophen (NORCO/VICODIN) 5-325 MG tablet Take 1-2 tablets by mouth every 6 (six) hours as needed. 30 tablet 0  . ibuprofen (ADVIL,MOTRIN) 800 MG tablet Take 800 mg by mouth daily as needed for moderate pain.    [name] levothyroxine (SYNTHROID, LEVOTHROID) 88 MCG tablet Take 88 mcg by mouth daily before breakfast.    . omeprazole (PRILOSEC) 20 MG capsule Take 20 mg by mouth daily as needed (for heartburn or acid reflux).     . sacubitril-valsartan (ENTRESTO) 24-26 MG Take 1 tablet by mouth 2 (two) times daily. 180 tablet 3   No current facility-administered medications for this visit.    PHYSICAL EXAMINATION: ECOG PERFORMANCE STATUS: 1 - Symptomatic but completely ambulatory  Filed Vitals:   04/12/16 1452  BP: 153/91  Pulse: 63  Temp: 98.6 F (37 C)  Resp: 19   Filed Weights   04/12/16  1452  Weight: 199 lb 12.8 oz (90.629 kg)    GENERAL:alert, no distress and comfortable SKIN: skin color, texture, turgor are normal, no rashes or significant lesions EYES: normal, Conjunctiva are pink and non-injected, sclera clear OROPHARYNX:no exudate, no  erythema and lips, buccal mucosa, and tongue normal  NECK: supple, thyroid normal size, non-tender, without nodularity LYMPH:  no palpable lymphadenopathy in the cervical, axillary or inguinal LUNGS: clear to auscultation and percussion with normal breathing effort HEART: regular rate & rhythm and no murmurs and no lower extremity edema ABDOMEN:abdomen soft, non-tender and normal bowel sounds MUSCULOSKELETAL:no cyanosis of digits and no clubbing  NEURO: alert & oriented x 3 with fluent speech, no focal motor/sensory deficits EXTREMITIES: No lower extremity edema  LABORATORY DATA:  I have reviewed the data as listed   Chemistry      Component Value Date/Time   NA 145 04/01/2016 1131   K 4.2 04/01/2016 1131   CL 112* 04/01/2016 1131   CO2 28 04/01/2016 1131   BUN 8 04/01/2016 1131   CREATININE 0.86 04/01/2016 1131      Component Value Date/Time   CALCIUM 9.4 04/01/2016 1131       Lab Results  Component Value Date   WBC 4.7 04/01/2016   HGB 11.4* 04/01/2016   HCT 35.9* 04/01/2016   MCV 91.1 04/01/2016   PLT 197 04/01/2016   NEUTROABS 2.0 12/23/2014   ASSESSMENT & PLAN:  Breast cancer of upper-inner quadrant of left female breast ([place]) Left lumpectomy 04/05/2016: IDC grade 3, 2.3 cm, associated high-grade DCIS, margins negative, 0/1 lymph node, T2 N0 stage II a, ER 0%, PR 0%, HER-2 negative ratio 1.38  Pathology counseling: I discussed the final pathology report of the patient provided  a copy of this report. I discussed the margins as well as lymph node surgeries. We also discussed the final staging along with previously performed ER/PR and HER-2/neu testing.  Recommendation: 1. Adjuvant chemotherapy: Patient is refusing adjuvant systemic chemotherapy. She fully understands that she has a high risk disease based upon triple negative breast cancer. 2. Adjuvant radiation therapy  Intractable pain of the port: She has been taking Vicodin which she tells me that it's not  helping but she is only taken 1 or 2 tablets of 5. I gave her an injection of Dilaudid today in the clinic. She would like me to call Dr. Dalbert Batman to have her port removed. She does not want to do chemotherapy and so I will request Dr. Dalbert Batman for port removal.  Return to clinic after radiation therapy.   No orders of the defined types were placed in this encounter.   The patient has a good understanding of the overall plan. she agrees with it. she will call with any problems that may develop before the next visit here.   Rulon Eisenmenger, MD 04/12/2016

## 2016-04-12 NOTE — H&P (Signed)
[name] [name] is an 55 y.o. female.   Chief Complaint: Pain at port site HPI: This is a patient of Dr. [name] and Dr. Nicholas Lose.  XRT - Dr. [name] She is s/p left lumpectomy, sentinel lymph node biopsy, and right IJ port placement by Dr. [name] on 04/05/16.  She has had severe intractable pain at the port site since surgery.  Today, she had a discussion with Dr. Lindi Adie and is refusing systemic chemotherapy despite her high risk for recurrence with triple negative disease.  She would like to have the port removed ASAP, but Dr. [name] is out of town for the next week.  Dr. Lindi Adie has requested removal, so I will perform this for Dr. [name] in his absence.   Past Medical History  Diagnosis Date  . Nonischemic cardiomyopathy ([place])     Etiology unknown, diagnosed 2008, patient has never had catheterization or nuclear study as of March, 2011  . Bradycardia   . GERD (gastroesophageal reflux disease)   . Hypothyroidism   . Dyslipidemia     LDL elevation,Patient does not want statin  . TIA (transient ischemic attack)     No CT or MRI abnormality, aspirin therapy  . Weight gain   . Leg swelling     right leg  . Pain     left leg & left knee  . Cough due to angiotensin-converting enzyme inhibitor     But patient chooses to continue ACE  . ICD (implantable cardiac defibrillator) battery depletion     Dr. [name], June, 2009(artifactual atrial tachycardia response events addressed by reprogramming in parentheses  . Dizziness     Dizziness with standing after squatting, March, 2012  . Arthritis   . Itching   . Ejection fraction < 50%   . Breast cancer ([place]) 02/24/16    Left breast cancer  . Medical history non-contributory     cardiomyopathy- icd  . Stroke ([place]) 07    tia  . CHF (congestive heart failure) ([place])   . AICD (automatic cardioverter/defibrillator) present 09    [employer]- replaced 2016    Past Surgical History  Procedure Laterality Date  . Cardiac defibrillator  placement  [place] no remote  . Tubal ligation  94  . Upper gi endoscopy    . Colonoscopy    . Total thyroidectomy  2001  . Cyst excision Left     back of leg-lt   . Lateral epicondyle release  10/11/2012    Procedure: TENNIS ELBOW RELEASE;  Surgeon: [name], MD;  Location: [facility];  Service: Orthopedics;  Laterality: Right;  RIGHT ELBOW: TENOTOMY ELBOW LATERAL EPICONDYLITIS TENNIS ELBOW: RADIAL TUNNEL RELEASE  . Implantable cardioverter defibrillator generator change N/A 12/29/2014    Procedure: IMPLANTABLE CARDIOVERTER DEFIBRILLATOR GENERATOR CHANGE;  Surgeon: [name], MD;  Location: [facility] CATH LAB;  Service: Cardiovascular;  Laterality: N/A;  . Breast lumpectomy with radioactive seed and sentinel lymph node biopsy Left 04/05/2016    Procedure: BREAST LUMPECTOMY WITH RADIOACTIVE SEED AND SENTINEL LYMPH NODE BIOPSY;  Surgeon: [name], MD;  Location: [facility];  Service: General;  Laterality: Left;  . Portacath placement Right 04/05/2016    Procedure: INSERTION PORT-A-CATH WITH [place];  Surgeon: [name], MD;  Location: [facility];  Service: General;  Laterality: Right;  right IJ    Family History  Problem Relation Age of Onset  . Heart attack Mother   . Cancer Mother  breast  . Hypertension Sister   . Hypertension Brother   . Stroke Neg Hx   . Diabetes Neg Hx   . Cancer Maternal Aunt     breast   Social History:  reports that she has never smoked. She has never used smokeless tobacco. She reports that she does not drink alcohol or use illicit drugs.  Allergies:  Allergies  Allergen Reactions  . Avelox [Moxifloxacin Hcl In Nacl] Shortness Of Breath    Shortness of breath  . Clindamycin/Lincomycin Shortness Of Breath and Diarrhea    Breathing/diarrhea    . Kiwi Extract Anaphylaxis    Makes pt. Feel like her throat is closing   . Bactrim [Sulfamethoxazole-Trimethoprim] Swelling  . Levofloxacin Other (See Comments)    JOINT PAIN  .  Spironolactone Other (See Comments)    burning  . Sulfa Antibiotics Swelling     (Not in a hospital admission)  No results found for this or any previous visit (from the past 48 hour(s)). Dg Chest 2 View  04/11/2016  CLINICAL DATA:  RIGHT shoulder and chest pain for 3 days since Port-A-Cath placement last Tuesday, history non ischemic cardiomyopathy, bradycardia, breast cancer, CHF EXAM: CHEST  2 VIEW COMPARISON:  04/05/2016 FINDINGS: RIGHT jugular Port-A-Cath with tip projecting over SVC. LEFT subclavian AICD with leads projecting at RIGHT atrium and RIGHT ventricle. Minimal enlargement of cardiac silhouette. Mediastinal contours and pulmonary vascularity normal. Lungs clear. No pulmonary infiltrate, pleural effusion or pneumothorax. Bones unremarkable. IMPRESSION: No acute abnormalities. Minimal enlargement of cardiac silhouette. Electronically Signed   By: Lavonia Dana M.D.   On: 04/11/2016 13:51    ROS  Physical Exam  GENERAL:alert, no distress and comfortable SKIN: skin color, texture, turgor are normal, no rashes or significant lesions EYES: normal, Conjunctiva are pink and non-injected, sclera clear OROPHARYNX:no exudate, no erythema and lips, buccal mucosa, and tongue normal  NECK: supple, thyroid normal size, non-tender, without nodularity LYMPH: no palpable lymphadenopathy in the cervical, axillary or inguinal LUNGS: clear to auscultation and percussion with normal breathing effort HEART: regular rate & rhythm and no murmurs and no lower extremity edema ABDOMEN:abdomen soft, non-tender and normal bowel sounds MUSCULOSKELETAL:no cyanosis of digits and no clubbing  NEURO: alert & oriented x 3 with fluent speech, no focal motor/sensory deficits EXTREMITIES: No lower extremity edema Assessment/Plan Port removal  The surgical procedure has been discussed with the patient.  Potential risks, benefits, alternative treatments, and expected outcomes have been explained.  All of the  patient's questions at this time have been answered.  The likelihood of reaching the patient's treatment goal is good.  The patient understand the proposed surgical procedure and wishes to proceed.   Maia Petties., MD 04/12/2016, 4:36 PM

## 2016-04-12 NOTE — ED Provider Notes (Signed)
CSN: [id number]     Arrival date & time 04/12/16  2144 History   First MD Initiated Contact with Patient 04/12/16 2157     Chief Complaint  Patient presents with  . Emesis  . Mass     (Consider location/radiation/quality/duration/timing/severity/associated sxs/prior Treatment) HPI 55 year old female presents today complaining of right chest pain and swelling in her left axilla. She recently had a lumpectomy and lymph node biopsy on the left. At that time, she had a port placed on the right. She was planning on having chemotherapy. She has had pain with the port on the right and this has been unresponsive to pain medicine. She has scheduled surgery to have the port removed tomorrow. Today she noted some swelling under the surgical site in the left armpit and came in for evaluation secondary to this. She has not noted any redness, warmth, tenderness, streaking, or fever. Past Medical History  Diagnosis Date  . Nonischemic cardiomyopathy ([place])     Etiology unknown, diagnosed 2008, patient has never had catheterization or nuclear study as of March, 2011  . Bradycardia   . GERD (gastroesophageal reflux disease)   . Hypothyroidism   . Dyslipidemia     LDL elevation,Patient does not want statin  . TIA (transient ischemic attack)     No CT or MRI abnormality, aspirin therapy  . Weight gain   . Leg swelling     right leg  . Pain     left leg & left knee  . Cough due to angiotensin-converting enzyme inhibitor     But patient chooses to continue ACE  . ICD (implantable cardiac defibrillator) battery depletion     Dr. [name], June, 2009(artifactual atrial tachycardia response events addressed by reprogramming in parentheses  . Dizziness     Dizziness with standing after squatting, March, 2012  . Arthritis   . Itching   . Ejection fraction < 50%   . Breast cancer ([place]) 02/24/16    Left breast cancer  . Medical history non-contributory     cardiomyopathy- icd  . Stroke ([place]) 07    tia  .  CHF (congestive heart failure) ([place])   . AICD (automatic cardioverter/defibrillator) present 09    [employer]- replaced 2016   Past Surgical History  Procedure Laterality Date  . Cardiac defibrillator placement  [place] no remote  . Tubal ligation  94  . Upper gi endoscopy    . Colonoscopy    . Total thyroidectomy  2001  . Cyst excision Left     back of leg-lt   . Lateral epicondyle release  10/11/2012    Procedure: TENNIS ELBOW RELEASE;  Surgeon: [name], MD;  Location: [facility];  Service: Orthopedics;  Laterality: Right;  RIGHT ELBOW: TENOTOMY ELBOW LATERAL EPICONDYLITIS TENNIS ELBOW: RADIAL TUNNEL RELEASE  . Implantable cardioverter defibrillator generator change N/A 12/29/2014    Procedure: IMPLANTABLE CARDIOVERTER DEFIBRILLATOR GENERATOR CHANGE;  Surgeon: [name], MD;  Location: [facility] CATH LAB;  Service: Cardiovascular;  Laterality: N/A;  . Breast lumpectomy with radioactive seed and sentinel lymph node biopsy Left 04/05/2016    Procedure: BREAST LUMPECTOMY WITH RADIOACTIVE SEED AND SENTINEL LYMPH NODE BIOPSY;  Surgeon: [name], MD;  Location: [facility];  Service: General;  Laterality: Left;  . Portacath placement Right 04/05/2016    Procedure: INSERTION PORT-A-CATH WITH [place];  Surgeon: [name], MD;  Location: [facility];  Service: General;  Laterality: Right;  right IJ   Family  History  Problem Relation Age of Onset  . Heart attack Mother   . Cancer Mother     breast  . Hypertension Sister   . Hypertension Brother   . Stroke Neg Hx   . Diabetes Neg Hx   . Cancer Maternal Aunt     breast   Social History  Substance Use Topics  . Smoking status: Never Smoker   . Smokeless tobacco: Never Used  . Alcohol Use: No   OB History    No data available     Review of Systems  All other systems reviewed and are negative.     Allergies  Avelox; Clindamycin/lincomycin; Kiwi extract; Bactrim; Levofloxacin; Spironolactone; and  Sulfa antibiotics  Home Medications   Prior to Admission medications   Medication Sig Start Date End Date Taking? Authorizing Provider  aspirin EC 81 MG tablet Take 162 mg by mouth daily.  11/22/11  Yes [name], MD  carvedilol (COREG) 12.5 MG tablet Take 1 tablet (12.5 mg total) by mouth 2 (two) times daily with a meal. 10/21/15  Yes Isaiah Serge, NP  ibuprofen (ADVIL,MOTRIN) 800 MG tablet Take 800 mg by mouth daily as needed for moderate pain.   Yes Historical Provider, MD  levothyroxine (SYNTHROID, LEVOTHROID) 88 MCG tablet Take 88 mcg by mouth at bedtime.    Yes Historical Provider, MD  omeprazole (PRILOSEC) 20 MG capsule Take 20 mg by mouth daily as needed (for heartburn or acid reflux).    Yes Historical Provider, MD  sacubitril-valsartan (ENTRESTO) 24-26 MG Take 1 tablet by mouth 2 (two) times daily. 01/14/16  Yes Dorothy Spark, MD  HYDROcodone-acetaminophen (NORCO/VICODIN) 5-325 MG tablet Take 1-2 tablets by mouth every 6 (six) hours as needed. Patient not taking: Reported on 04/12/2016 04/05/16   [name], MD   BP 142/69 mmHg  Pulse 55  Temp(Src) 98 F (36.7 C) (Oral)  Resp 19  Ht 5\' 8"  (1.727 m)  Wt 90.266 kg  BMI 30.26 kg/m2  SpO2 100% Physical Exam  Constitutional: She is oriented to person, place, and time. She appears well-developed and well-nourished. No distress.  HENT:  Head: Normocephalic and atraumatic.  Right Ear: External ear normal.  Left Ear: External ear normal.  Nose: Nose normal.  Mouth/Throat: Oropharynx is clear and moist.  Eyes: Conjunctivae and EOM are normal. Pupils are equal, round, and reactive to light.  Neck: Normal range of motion. Neck supple.  Cardiovascular: Normal rate and regular rhythm.   Pulmonary/Chest: Effort normal and breath sounds normal.  Tenderness on right chest wall Port site. Left axilla with incision line appears to be well healing. There is some nontender swelling length of the incision. There is no warmth,  redness, or streaking.  Abdominal: Soft. Bowel sounds are normal.  Neurological: She is alert and oriented to person, place, and time.  Skin: Skin is warm and dry.  Psychiatric: She has a normal mood and affect.  Nursing note and vitals reviewed.   ED Course  Procedures (including critical care time) Labs Review Labs Reviewed  CBC WITH DIFFERENTIAL/PLATELET - Abnormal; Notable for the following:    RBC 3.82 (*)    Hemoglobin 11.2 (*)    HCT 35.0 (*)    All other components within normal limits  BASIC METABOLIC PANEL - Abnormal; Notable for the following:    Glucose, Bld 101 (*)    All other components within normal limits    Imaging Review Dg Chest 2 View  04/12/2016  CLINICAL DATA:  Pt had lumpectomy on Tuesday. Left side chest pain and swelling. Pt also c/o right side pain and discomfort with port. Hx CHF, stroke EXAM: CHEST - 2 VIEW COMPARISON:  04/11/2016 FINDINGS: Right IJ port catheter and left subclavian AICD are stable in position. Lungs clear. Heart size upper limits normal.  Mildly tortuous thoracic aorta. No effusion. Visualized bones unremarkable. Surgical clips at the thoracic inlet on the left. IMPRESSION: 1. No acute disease. 2. Stable postop changes. Electronically Signed   By: Lucrezia Europe M.D.   On: 04/12/2016 22:55   Dg Chest 2 View  04/11/2016  CLINICAL DATA:  RIGHT shoulder and chest pain for 3 days since Port-A-Cath placement last Tuesday, history non ischemic cardiomyopathy, bradycardia, breast cancer, CHF EXAM: CHEST  2 VIEW COMPARISON:  04/05/2016 FINDINGS: RIGHT jugular Port-A-Cath with tip projecting over SVC. LEFT subclavian AICD with leads projecting at RIGHT atrium and RIGHT ventricle. Minimal enlargement of cardiac silhouette. Mediastinal contours and pulmonary vascularity normal. Lungs clear. No pulmonary infiltrate, pleural effusion or pneumothorax. Bones unremarkable. IMPRESSION: No acute abnormalities. Minimal enlargement of cardiac silhouette.  Electronically Signed   By: Lavonia Dana M.D.   On: 04/11/2016 13:51   I have personally reviewed and evaluated these images and lab results as part of my medical decision-making.   EKG Interpretation None      MDM   Final diagnoses:  Swelling  Swelling in left axilla postoperatively is most consistent with seroma. Labs were checked here and she has no elevated white count, temperature is normal, and no external signs of infection are noted. Patient is having surgery to remove her port tomorrow due to pain. Chest x-Avital Dancy obtained here shows no evidence of pneumothorax on that side or any surrounding air or concerns for infection. I did advise patient that she should let surgeon know in the morning that she has this axillary swelling so they can reassess it at that time. Patient is discharged in improved condition.    Pattricia Boss, MD 04/12/16 [phone]

## 2016-04-12 NOTE — ED Notes (Signed)
Pt coming from home. Per [facility]. Pt had a shot today at the cancer center and has Lump under armpit near the breast that appeared following her shot in her buttocks. Not painful. Pt Emeses x1. PICC line removed recently.  HR 60.   IV R FA SL  Pt has a defibrillator.

## 2016-04-12 NOTE — Progress Notes (Signed)
Pt decided not to go forth with chemotherapy. Per Dr. Lindi Adie reached out to Dr. [name] to discuss port removal. Msg sent to Dr. Ida Rogue scheduler for appt with dr. [name] for xrt. Discuss plan with pt and next step. Denies questions at this time.

## 2016-04-12 NOTE — Assessment & Plan Note (Addendum)
Left lumpectomy 04/05/2016: IDC grade 3, 2.3 cm, associated high-grade DCIS, margins negative, 0/1 lymph node, T2 N0 stage II a, ER 0%, PR 0%, HER-2 negative ratio 1.38  Pathology counseling: I discussed the final pathology report of the patient provided  a copy of this report. I discussed the margins as well as lymph node surgeries. We also discussed the final staging along with previously performed ER/PR and HER-2/neu testing.  Recommendation: 1. Adjuvant chemotherapy: Patient is refusing adjuvant systemic chemotherapy. She fully understands that she has a high risk disease based upon triple negative breast cancer. 2. Adjuvant radiation therapy  Intractable pain of the port: She has been taking Vicodin which she tells me that it's not helping but she is only taken 1 or 2 tablets of 5. I gave her an injection of Dilaudid today in the clinic. She would like me to call Dr. Dalbert Batman to have her port removed. She does not want to do chemotherapy and so I will request Dr. Dalbert Batman for port removal.  Return to clinic after radiation therapy.

## 2016-04-12 NOTE — Discharge Instructions (Signed)
Please show surgeon swelling area before surgery to remove catheter tomorrow.

## 2016-04-13 ENCOUNTER — Ambulatory Visit (HOSPITAL_BASED_OUTPATIENT_CLINIC_OR_DEPARTMENT_OTHER): Payer: BLUE CROSS/BLUE SHIELD | Admitting: Anesthesiology

## 2016-04-13 ENCOUNTER — Encounter: Payer: Self-pay | Admitting: *Deleted

## 2016-04-13 ENCOUNTER — Telehealth: Payer: Self-pay | Admitting: Cardiology

## 2016-04-13 ENCOUNTER — Encounter (HOSPITAL_BASED_OUTPATIENT_CLINIC_OR_DEPARTMENT_OTHER): Payer: Self-pay | Admitting: Anesthesiology

## 2016-04-13 ENCOUNTER — Ambulatory Visit (HOSPITAL_BASED_OUTPATIENT_CLINIC_OR_DEPARTMENT_OTHER)
Admission: RE | Admit: 2016-04-13 | Discharge: 2016-04-13 | Disposition: A | Discharge status: Home / Self Care | Payer: BLUE CROSS/BLUE SHIELD | Source: Ambulatory Visit | Attending: Surgery | Admitting: Surgery

## 2016-04-13 ENCOUNTER — Encounter (HOSPITAL_BASED_OUTPATIENT_CLINIC_OR_DEPARTMENT_OTHER)
Admission: RE | Disposition: A | Discharge status: Home / Self Care | Payer: Self-pay | Source: Ambulatory Visit | Attending: Surgery

## 2016-04-13 DIAGNOSIS — Y838 Other surgical procedures as the cause of abnormal reaction of the patient, or of later complication, without mention of misadventure at the time of the procedure: Secondary | ICD-10-CM | POA: Insufficient documentation

## 2016-04-13 DIAGNOSIS — C50919 Malignant neoplasm of unspecified site of unspecified female breast: Secondary | ICD-10-CM | POA: Diagnosis not present

## 2016-04-13 DIAGNOSIS — E785 Hyperlipidemia, unspecified: Secondary | ICD-10-CM | POA: Insufficient documentation

## 2016-04-13 DIAGNOSIS — Z8673 Personal history of transient ischemic attack (TIA), and cerebral infarction without residual deficits: Secondary | ICD-10-CM | POA: Diagnosis not present

## 2016-04-13 DIAGNOSIS — T82848A Pain from vascular prosthetic devices, implants and grafts, initial encounter: Secondary | ICD-10-CM | POA: Insufficient documentation

## 2016-04-13 DIAGNOSIS — Z452 Encounter for adjustment and management of vascular access device: Secondary | ICD-10-CM | POA: Diagnosis not present

## 2016-04-13 DIAGNOSIS — Z853 Personal history of malignant neoplasm of breast: Secondary | ICD-10-CM | POA: Diagnosis not present

## 2016-04-13 DIAGNOSIS — Z95810 Presence of automatic (implantable) cardiac defibrillator: Secondary | ICD-10-CM | POA: Diagnosis not present

## 2016-04-13 DIAGNOSIS — I509 Heart failure, unspecified: Secondary | ICD-10-CM | POA: Insufficient documentation

## 2016-04-13 DIAGNOSIS — C50212 Malignant neoplasm of upper-inner quadrant of left female breast: Secondary | ICD-10-CM

## 2016-04-13 HISTORY — PX: PORT-A-CATH REMOVAL: SHX5289

## 2016-04-13 SURGERY — MINOR REMOVAL PORT-A-CATH
Anesthesia: Monitor Anesthesia Care | Site: Chest

## 2016-04-13 MED ORDER — BUPIVACAINE-EPINEPHRINE (PF) 0.25% -1:200000 IJ SOLN
INTRAMUSCULAR | Status: AC
  Filled 2016-04-13: qty 60

## 2016-04-13 MED ORDER — HYDROCODONE-ACETAMINOPHEN 5-325 MG PO TABS: 1.0000 | ORAL_TABLET | ORAL | Status: DC | PRN

## 2016-04-13 MED ORDER — PROPOFOL 10 MG/ML IV BOLUS
INTRAVENOUS | Status: AC
  Filled 2016-04-13: qty 20

## 2016-04-13 MED ORDER — LIDOCAINE HCL (PF) 1 % IJ SOLN
INTRAMUSCULAR | Status: AC
  Filled 2016-04-13: qty 30

## 2016-04-13 MED ORDER — BUPIVACAINE-EPINEPHRINE 0.25% -1:200000 IJ SOLN
INTRAMUSCULAR | Status: DC | PRN
  Administered 2016-04-13: 14 mL

## 2016-04-13 MED ORDER — SODIUM BICARBONATE 4 % IV SOLN
INTRAVENOUS | Status: AC
  Filled 2016-04-13: qty 5

## 2016-04-13 MED ORDER — ONDANSETRON HCL 4 MG/2ML IJ SOLN
INTRAMUSCULAR | Status: AC
  Filled 2016-04-13: qty 2

## 2016-04-13 MED ORDER — MIDAZOLAM HCL 2 MG/2ML IJ SOLN
INTRAMUSCULAR | Status: AC
  Filled 2016-04-13: qty 2

## 2016-04-13 MED ORDER — MORPHINE SULFATE (PF) 2 MG/ML IV SOLN: 2.0000 mg | INTRAVENOUS | Status: DC | PRN

## 2016-04-13 MED ORDER — FENTANYL CITRATE (PF) 100 MCG/2ML IJ SOLN
INTRAMUSCULAR | Status: AC
  Filled 2016-04-13: qty 2

## 2016-04-13 MED ORDER — ONDANSETRON HCL 4 MG/2ML IJ SOLN: 4.0000 mg | INTRAMUSCULAR | Status: DC | PRN

## 2016-04-13 SURGICAL SUPPLY — 40 items
APPLICATOR COTTON TIP 6IN STRL (MISCELLANEOUS) IMPLANT
BENZOIN TINCTURE PRP APPL 2/3 (GAUZE/BANDAGES/DRESSINGS) ×6 IMPLANT
BLADE HEX COATED 2.75 (ELECTRODE) ×3 IMPLANT
BLADE SURG 15 STRL LF DISP TIS (BLADE) ×2 IMPLANT
BLADE SURG 15 STRL SS (BLADE) ×1
CANISTER SUCT 1200ML W/VALVE (MISCELLANEOUS) IMPLANT
CHLORAPREP W/TINT 26ML (MISCELLANEOUS) ×3 IMPLANT
COVER BACK TABLE 60X90IN (DRAPES) ×3 IMPLANT
COVER MAYO STAND STRL (DRAPES) ×3 IMPLANT
DECANTER SPIKE VIAL GLASS SM (MISCELLANEOUS) ×3 IMPLANT
DRAPE LAPAROTOMY 100X72 PEDS (DRAPES) ×3 IMPLANT
DRAPE UTILITY XL STRL (DRAPES) ×3 IMPLANT
DRSG TEGADERM 4X4.75 (GAUZE/BANDAGES/DRESSINGS) ×3 IMPLANT
ELECT REM PT RETURN 9FT ADLT (ELECTROSURGICAL) ×3
ELECTRODE REM PT RTRN 9FT ADLT (ELECTROSURGICAL) ×2 IMPLANT
GLOVE BIO SURGEON STRL SZ 6.5 (GLOVE) ×3 IMPLANT
GLOVE BIO SURGEON STRL SZ7 (GLOVE) ×3 IMPLANT
GLOVE BIOGEL PI IND STRL 7.0 (GLOVE) ×2 IMPLANT
GLOVE BIOGEL PI IND STRL 7.5 (GLOVE) ×2 IMPLANT
GLOVE BIOGEL PI INDICATOR 7.0 (GLOVE) ×1
GLOVE BIOGEL PI INDICATOR 7.5 (GLOVE) ×1
GLOVE ECLIPSE 6.5 STRL STRAW (GLOVE) ×3 IMPLANT
GOWN STRL REUS W/ TWL LRG LVL3 (GOWN DISPOSABLE) ×4 IMPLANT
GOWN STRL REUS W/TWL LRG LVL3 (GOWN DISPOSABLE) ×2
NEEDLE HYPO 25X1 1.5 SAFETY (NEEDLE) ×3 IMPLANT
NS IRRIG 1000ML POUR BTL (IV SOLUTION) IMPLANT
PACK BASIN DAY SURGERY FS (CUSTOM PROCEDURE TRAY) ×3 IMPLANT
PENCIL BUTTON HOLSTER BLD 10FT (ELECTRODE) ×3 IMPLANT
SPONGE GAUZE 2X2 8PLY STRL LF (GAUZE/BANDAGES/DRESSINGS) IMPLANT
SPONGE GAUZE 4X4 12PLY STER LF (GAUZE/BANDAGES/DRESSINGS) IMPLANT
SPONGE LAP 4X18 X RAY DECT (DISPOSABLE) IMPLANT
STRIP CLOSURE SKIN 1/2X4 (GAUZE/BANDAGES/DRESSINGS) ×3 IMPLANT
SUT MON AB 4-0 PC3 18 (SUTURE) ×3 IMPLANT
SUT VIC AB 3-0 SH 27 (SUTURE) ×1
SUT VIC AB 3-0 SH 27X BRD (SUTURE) ×2 IMPLANT
SYR CONTROL 10ML LL (SYRINGE) ×3 IMPLANT
TOWEL OR 17X24 6PK STRL BLUE (TOWEL DISPOSABLE) ×6 IMPLANT
TOWEL OR NON WOVEN STRL DISP B (DISPOSABLE) ×3 IMPLANT
TUBE CONNECTING 20X1/4 (TUBING) IMPLANT
YANKAUER SUCT BULB TIP NO VENT (SUCTIONS) IMPLANT

## 2016-04-13 NOTE — Interval H&P Note (Signed)
History and Physical Interval Note:  04/13/2016 8:23 AM  [name] [name]  has presented today for surgery, with the diagnosis of BREAST CANCER  The various methods of treatment have been discussed with the patient and family. After consideration of risks, benefits and other options for treatment, the patient has consented to  Procedure(s): MINOR REMOVAL PORT-A-CATH as a surgical intervention .  The patient's history has been reviewed, patient examined, no change in status, stable for surgery.  I have reviewed the patient's chart and labs.  Questions were answered to the patient's satisfaction.     Charlean Carneal K.

## 2016-04-13 NOTE — Telephone Encounter (Signed)
Request for surgical clearance:  1. What type of surgery is being performed? Port-a-cath removal   2. When is this surgery scheduled? n/a  3. Are there any medications that need to be held prior to surgery and how long? Aspirin   4. Name of physician performing surgery? [facility]   5. What is your office phone and fax number? [phone]

## 2016-04-13 NOTE — Telephone Encounter (Signed)
Will fax this clearance to Dr Pollie Friar office at [place] attention to Amy.

## 2016-04-13 NOTE — Discharge Instructions (Signed)
Ice pack to right chest area Remove dressing in two days, then you may shower over the steri-strips.Verbal postoperative instructions given by Dr Johnny Bridge patient and daughter understood instructions.

## 2016-04-13 NOTE — Telephone Encounter (Signed)
It is okay for this patient to hold aspirin 10 days prior to her procedure and restarted as soon as acceptable from surgical standpoint.  Please call us with any question,  Ena Dawley, M.D.

## 2016-04-13 NOTE — Anesthesia Postprocedure Evaluation (Signed)
Anesthesia Post Note  Patient: [name] [name]  Procedure(s) Performed: Procedure(s): MINOR REMOVAL PORT-A-CATH  Patient location during evaluation: PACU Anesthesia Type: General Level of consciousness: awake and alert Pain management: pain level controlled Vital Signs Assessment: post-procedure vital signs reviewed and stable Respiratory status: spontaneous breathing, nonlabored ventilation and respiratory function stable Cardiovascular status: blood pressure returned to baseline and stable Postop Assessment: no signs of nausea or vomiting Anesthetic complications: no    Last Vitals:  Filed Vitals:   04/13/16 0806 04/13/16 0921  BP: 151/78 143/94  Pulse: 86 81  Temp: 36.9 C 36.9 C  Resp: 16 16    Last Pain:  Filed Vitals:   04/13/16 0924  PainSc: 8                  Rachal Dvorsky A

## 2016-04-13 NOTE — H&P (View-Only) (Signed)
[name] [name] is an 55 y.o. female.   Chief Complaint: Pain at port site HPI: This is a patient of Dr. [name] and Dr. Nicholas Lose.  XRT - Dr. [name] She is s/p left lumpectomy, sentinel lymph node biopsy, and right IJ port placement by Dr. [name] on 04/05/16.  She has had severe intractable pain at the port site since surgery.  Today, she had a discussion with Dr. Lindi Adie and is refusing systemic chemotherapy despite her high risk for recurrence with triple negative disease.  She would like to have the port removed ASAP, but Dr. [name] is out of town for the next week.  Dr. Lindi Adie has requested removal, so I will perform this for Dr. [name] in his absence.   Past Medical History  Diagnosis Date  . Nonischemic cardiomyopathy ([place])     Etiology unknown, diagnosed 2008, patient has never had catheterization or nuclear study as of March, 2011  . Bradycardia   . GERD (gastroesophageal reflux disease)   . Hypothyroidism   . Dyslipidemia     LDL elevation,Patient does not want statin  . TIA (transient ischemic attack)     No CT or MRI abnormality, aspirin therapy  . Weight gain   . Leg swelling     right leg  . Pain     left leg & left knee  . Cough due to angiotensin-converting enzyme inhibitor     But patient chooses to continue ACE  . ICD (implantable cardiac defibrillator) battery depletion     Dr. [name], June, 2009(artifactual atrial tachycardia response events addressed by reprogramming in parentheses  . Dizziness     Dizziness with standing after squatting, March, 2012  . Arthritis   . Itching   . Ejection fraction < 50%   . Breast cancer ([place]) 02/24/16    Left breast cancer  . Medical history non-contributory     cardiomyopathy- icd  . Stroke ([place]) 07    tia  . CHF (congestive heart failure) ([place])   . AICD (automatic cardioverter/defibrillator) present 09    [employer]- replaced 2016    Past Surgical History  Procedure Laterality Date  . Cardiac defibrillator  placement  [place] no remote  . Tubal ligation  94  . Upper gi endoscopy    . Colonoscopy    . Total thyroidectomy  2001  . Cyst excision Left     back of leg-lt   . Lateral epicondyle release  10/11/2012    Procedure: TENNIS ELBOW RELEASE;  Surgeon: [name], MD;  Location: [facility];  Service: Orthopedics;  Laterality: Right;  RIGHT ELBOW: TENOTOMY ELBOW LATERAL EPICONDYLITIS TENNIS ELBOW: RADIAL TUNNEL RELEASE  . Implantable cardioverter defibrillator generator change N/A 12/29/2014    Procedure: IMPLANTABLE CARDIOVERTER DEFIBRILLATOR GENERATOR CHANGE;  Surgeon: [name], MD;  Location: [facility] CATH LAB;  Service: Cardiovascular;  Laterality: N/A;  . Breast lumpectomy with radioactive seed and sentinel lymph node biopsy Left 04/05/2016    Procedure: BREAST LUMPECTOMY WITH RADIOACTIVE SEED AND SENTINEL LYMPH NODE BIOPSY;  Surgeon: [name], MD;  Location: [facility];  Service: General;  Laterality: Left;  . Portacath placement Right 04/05/2016    Procedure: INSERTION PORT-A-CATH WITH [place];  Surgeon: [name], MD;  Location: [facility];  Service: General;  Laterality: Right;  right IJ    Family History  Problem Relation Age of Onset  . Heart attack Mother   . Cancer Mother  breast  . Hypertension Sister   . Hypertension Brother   . Stroke Neg Hx   . Diabetes Neg Hx   . Cancer Maternal Aunt     breast   Social History:  reports that she has never smoked. She has never used smokeless tobacco. She reports that she does not drink alcohol or use illicit drugs.  Allergies:  Allergies  Allergen Reactions  . Avelox [Moxifloxacin Hcl In Nacl] Shortness Of Breath    Shortness of breath  . Clindamycin/Lincomycin Shortness Of Breath and Diarrhea    Breathing/diarrhea    . Kiwi Extract Anaphylaxis    Makes pt. Feel like her throat is closing   . Bactrim [Sulfamethoxazole-Trimethoprim] Swelling  . Levofloxacin Other (See Comments)    JOINT PAIN  .  Spironolactone Other (See Comments)    burning  . Sulfa Antibiotics Swelling     (Not in a hospital admission)  No results found for this or any previous visit (from the past 48 hour(s)). Dg Chest 2 View  04/11/2016  CLINICAL DATA:  RIGHT shoulder and chest pain for 3 days since Port-A-Cath placement last Tuesday, history non ischemic cardiomyopathy, bradycardia, breast cancer, CHF EXAM: CHEST  2 VIEW COMPARISON:  04/05/2016 FINDINGS: RIGHT jugular Port-A-Cath with tip projecting over SVC. LEFT subclavian AICD with leads projecting at RIGHT atrium and RIGHT ventricle. Minimal enlargement of cardiac silhouette. Mediastinal contours and pulmonary vascularity normal. Lungs clear. No pulmonary infiltrate, pleural effusion or pneumothorax. Bones unremarkable. IMPRESSION: No acute abnormalities. Minimal enlargement of cardiac silhouette. Electronically Signed   By: Lavonia Dana M.D.   On: 04/11/2016 13:51    ROS  Physical Exam  GENERAL:alert, no distress and comfortable SKIN: skin color, texture, turgor are normal, no rashes or significant lesions EYES: normal, Conjunctiva are pink and non-injected, sclera clear OROPHARYNX:no exudate, no erythema and lips, buccal mucosa, and tongue normal  NECK: supple, thyroid normal size, non-tender, without nodularity LYMPH: no palpable lymphadenopathy in the cervical, axillary or inguinal LUNGS: clear to auscultation and percussion with normal breathing effort HEART: regular rate & rhythm and no murmurs and no lower extremity edema ABDOMEN:abdomen soft, non-tender and normal bowel sounds MUSCULOSKELETAL:no cyanosis of digits and no clubbing  NEURO: alert & oriented x 3 with fluent speech, no focal motor/sensory deficits EXTREMITIES: No lower extremity edema Assessment/Plan Port removal  The surgical procedure has been discussed with the patient.  Potential risks, benefits, alternative treatments, and expected outcomes have been explained.  All of the  patient's questions at this time have been answered.  The likelihood of reaching the patient's treatment goal is good.  The patient understand the proposed surgical procedure and wishes to proceed.   Maia Petties., MD 04/12/2016, 4:36 PM

## 2016-04-13 NOTE — Op Note (Signed)
Preop diagnosis: Left breast cancer, painful port site Postop diagnosis: Same Procedure performed: Removal of right internal jugular port Surgeon:Tonette Koehne K. Anesthesia: Local Indications: This is a 55 year old female who is status post lumpectomy and sentinel lymph node biopsy and port placement about 1 week ago by Dr. [name]. Since that time, the patient has developed severe pain around her port that is not managed by oral pain medication. She has also reached the decision to not undergo any systemic chemotherapy. After discussion with her oncologist, she has made the decision to have the port removed. Her usual surgeon is not available this week so I was asked to remove her port.  Description of procedure: The patient is brought to the operating room and placed in a supine position on the operating room table with her arms tucked at her sides. A magnet was placed over her AICD in the event that we would need to use cautery. Her right chest and neck were prepped with ChloraPrep and draped sterile fashion. A timeout was taken to ensure the proper patient and proper procedure. We removed the skin adhesive. The area around the wound was prepped with ChloraPrep and draped sterile fashion. The incision was opened sharply. We dissected down to the port bluntly. The catheter was removed and pressure was held over the internal jugular vein. The 2 sutures holding the port in place were cut and the port was removed. We held pressure over the pocket of the port. After several minutes we examined the jugular site and there is no sign of hematoma. We inspected the subcutaneous pocket and there was good hemostasis. We closed the wound with 3-0 Vicryl 4-0 Monocryl. Steri-Strips and clean dressings were applied. The patient was then brought to the recovery room in stable condition. All counts were correct.  Imogene Burn. Georgette Dover, MD, [facility] Surgery  General/ Trauma Surgery  04/13/2016 9:14 AM

## 2016-04-13 NOTE — Transfer of Care (Signed)
Immediate Anesthesia Transfer of Care Note  Patient: [name] [name]  Procedure(s) Performed: Procedure(s): MINOR REMOVAL PORT-A-CATH  Patient Location: PACU  Anesthesia Type: LOCAL/ monitored VS   Level of Consciousness: awake, never sedated, patient cooperative and responds to stimulation  Airway & Oxygen Therapy: Patient Spontanous Breathing and Patient connected to face mask oxygen  Post-op Assessment: Report given to PACU RN, Post -op Vital signs reviewed and stable and Patient moving all extremities  Post vital signs: Reviewed and stable  Complications: No apparent anesthesia complications

## 2016-04-13 NOTE — Telephone Encounter (Signed)
Will route this clearance request to Dr [name] for further review and recommendation and follow-up with Dr [name] thereafter.

## 2016-04-13 NOTE — Anesthesia Preprocedure Evaluation (Addendum)
Anesthesia Evaluation  Patient identified by MRN, date of birth, ID band Patient awake    Reviewed: Allergy & Precautions, NPO status , Patient's Chart, lab work & pertinent test results  Airway Mallampati: I  TM Distance: >3 FB Neck ROM: Full    Dental  (+) Dental Advisory Given, Teeth Intact   Pulmonary    breath sounds clear to auscultation       Cardiovascular +CHF   Rhythm:Regular Rate:Normal     Neuro/Psych    GI/Hepatic   Endo/Other    Renal/GU      Musculoskeletal   Abdominal   Peds  Hematology   Anesthesia Other Findings AICD in place has never fired  Reproductive/Obstetrics                            Anesthesia Physical Anesthesia Plan  ASA: III  Anesthesia Plan: MAC   Post-op Pain Management:    Induction: Intravenous  Airway Management Planned: Simple Face Mask  Additional Equipment:   Intra-op Plan:   Post-operative Plan:   Informed Consent: I have reviewed the patients History and Physical, chart, labs and discussed the procedure including the risks, benefits and alternatives for the proposed anesthesia with the patient or authorized representative who has indicated his/her understanding and acceptance.   Dental advisory given  Plan Discussed with: CRNA, Surgeon and Anesthesiologist  Anesthesia Plan Comments:         Anesthesia Quick Evaluation

## 2016-04-14 ENCOUNTER — Encounter (HOSPITAL_BASED_OUTPATIENT_CLINIC_OR_DEPARTMENT_OTHER): Payer: Self-pay | Admitting: Surgery

## 2016-04-16 ENCOUNTER — Other Ambulatory Visit: Payer: Self-pay

## 2016-04-16 ENCOUNTER — Encounter (HOSPITAL_COMMUNITY): Payer: Self-pay | Admitting: Emergency Medicine

## 2016-04-16 ENCOUNTER — Emergency Department (HOSPITAL_COMMUNITY)
Admission: EM | Admit: 2016-04-16 | Discharge: 2016-04-16 | Disposition: A | Discharge status: Home / Self Care | Payer: BLUE CROSS/BLUE SHIELD | Attending: Emergency Medicine | Admitting: Emergency Medicine

## 2016-04-16 DIAGNOSIS — Z7982 Long term (current) use of aspirin: Secondary | ICD-10-CM | POA: Diagnosis not present

## 2016-04-16 DIAGNOSIS — M542 Cervicalgia: Secondary | ICD-10-CM | POA: Diagnosis not present

## 2016-04-16 DIAGNOSIS — E785 Hyperlipidemia, unspecified: Secondary | ICD-10-CM | POA: Diagnosis not present

## 2016-04-16 DIAGNOSIS — Z791 Long term (current) use of non-steroidal anti-inflammatories (NSAID): Secondary | ICD-10-CM | POA: Diagnosis not present

## 2016-04-16 DIAGNOSIS — I509 Heart failure, unspecified: Secondary | ICD-10-CM | POA: Insufficient documentation

## 2016-04-16 DIAGNOSIS — M25511 Pain in right shoulder: Secondary | ICD-10-CM | POA: Diagnosis not present

## 2016-04-16 DIAGNOSIS — Z79891 Long term (current) use of opiate analgesic: Secondary | ICD-10-CM | POA: Diagnosis not present

## 2016-04-16 DIAGNOSIS — M1990 Unspecified osteoarthritis, unspecified site: Secondary | ICD-10-CM | POA: Diagnosis not present

## 2016-04-16 DIAGNOSIS — E039 Hypothyroidism, unspecified: Secondary | ICD-10-CM | POA: Diagnosis not present

## 2016-04-16 DIAGNOSIS — Z8673 Personal history of transient ischemic attack (TIA), and cerebral infarction without residual deficits: Secondary | ICD-10-CM | POA: Insufficient documentation

## 2016-04-16 DIAGNOSIS — R52 Pain, unspecified: Secondary | ICD-10-CM | POA: Diagnosis not present

## 2016-04-16 DIAGNOSIS — Z79899 Other long term (current) drug therapy: Secondary | ICD-10-CM | POA: Insufficient documentation

## 2016-04-16 DIAGNOSIS — Z853 Personal history of malignant neoplasm of breast: Secondary | ICD-10-CM | POA: Insufficient documentation

## 2016-04-16 MED ORDER — HYDROMORPHONE HCL 1 MG/ML IJ SOLN
1.0000 mg | Freq: Once | INTRAMUSCULAR | Status: AC
  Administered 2016-04-16: 1 mg via INTRAMUSCULAR
  Filled 2016-04-16: qty 1

## 2016-04-16 MED ORDER — GABAPENTIN 100 MG PO CAPS: 100.0000 mg | ORAL_CAPSULE | Freq: Three times a day (TID) | ORAL | Status: DC

## 2016-04-16 MED ORDER — METHOCARBAMOL 500 MG PO TABS: 500.0000 mg | ORAL_TABLET | Freq: Two times a day (BID) | ORAL | Status: DC | PRN

## 2016-04-16 NOTE — ED Notes (Signed)
Call out for sick pt via [facility].  Pt c/o neck and should pain, rates pain 10/10.  Pt had porta cath removed this pass Tuesday at [facility].

## 2016-04-16 NOTE — ED Provider Notes (Signed)
CSN: [id number]     Arrival date & time 04/16/16  1604 History   First MD Initiated Contact with Patient 04/16/16 1605     Chief Complaint  Patient presents with  . Neck Pain     (Consider location/radiation/quality/duration/timing/severity/associated sxs/prior Treatment) HPI   This very kind but unfortunate patient was dx with BRCA recently, s/p lumpectomy, s/p port placement in the R chest wall but had it removed 4 days age as she thought that the port was causing pain in the R shoulder, neck and chest wall - after removal she has had continued pain - she was given hydrocodone for pain by surgery and states it doesn't help so she stopped taking it - denies CP at this time - but has pain in the R neck and R shoulder that is constant, seems to be worse with movement of the head - not assoicated with any change in temperature of the R arm and no numbness or weakness of the R arm.  She has increased pain with rotationi of the head to the R.   Past Medical History  Diagnosis Date  . Nonischemic cardiomyopathy ([place])     Etiology unknown, diagnosed 2008, patient has never had catheterization or nuclear study as of March, 2011  . Bradycardia   . GERD (gastroesophageal reflux disease)   . Hypothyroidism   . Dyslipidemia     LDL elevation,Patient does not want statin  . TIA (transient ischemic attack)     No CT or MRI abnormality, aspirin therapy  . Weight gain   . Leg swelling     right leg  . Pain     left leg & left knee  . Cough due to angiotensin-converting enzyme inhibitor     But patient chooses to continue ACE  . ICD (implantable cardiac defibrillator) battery depletion     Dr. [name], June, 2009(artifactual atrial tachycardia response events addressed by reprogramming in parentheses  . Dizziness     Dizziness with standing after squatting, March, 2012  . Arthritis   . Itching   . Ejection fraction < 50%   . Breast cancer ([place]) 02/24/16    Left breast cancer  . Medical history  non-contributory     cardiomyopathy- icd  . Stroke ([place]) 07    tia  . CHF (congestive heart failure) ([place])   . AICD (automatic cardioverter/defibrillator) present 09    [employer]- replaced 2016   Past Surgical History  Procedure Laterality Date  . Cardiac defibrillator placement  [place] no remote  . Tubal ligation  94  . Upper gi endoscopy    . Colonoscopy    . Total thyroidectomy  2001  . Cyst excision Left     back of leg-lt   . Lateral epicondyle release  10/11/2012    Procedure: TENNIS ELBOW RELEASE;  Surgeon: [name], MD;  Location: [facility];  Service: Orthopedics;  Laterality: Right;  RIGHT ELBOW: TENOTOMY ELBOW LATERAL EPICONDYLITIS TENNIS ELBOW: RADIAL TUNNEL RELEASE  . Implantable cardioverter defibrillator generator change N/A 12/29/2014    Procedure: IMPLANTABLE CARDIOVERTER DEFIBRILLATOR GENERATOR CHANGE;  Surgeon: [name], MD;  Location: [facility] CATH LAB;  Service: Cardiovascular;  Laterality: N/A;  . Breast lumpectomy with radioactive seed and sentinel lymph node biopsy Left 04/05/2016    Procedure: BREAST LUMPECTOMY WITH RADIOACTIVE SEED AND SENTINEL LYMPH NODE BIOPSY;  Surgeon: [name], MD;  Location: [facility];  Service: General;  Laterality: Left;  .  Portacath placement Right 04/05/2016    Procedure: INSERTION PORT-A-CATH WITH [place];  Surgeon: [name], MD;  Location: [facility];  Service: General;  Laterality: Right;  right IJ  . Port-a-cath removal  04/13/2016    Procedure: MINOR REMOVAL PORT-A-CATH;  Surgeon: [name], MD;  Location: [facility];  Service: General;;   Family History  Problem Relation Age of Onset  . Heart attack Mother   . Cancer Mother     breast  . Hypertension Sister   . Hypertension Brother   . Stroke Neg Hx   . Diabetes Neg Hx   . Cancer Maternal Aunt     breast   Social History  Substance Use Topics  . Smoking status: Never Smoker   . Smokeless tobacco: Never Used  .  Alcohol Use: No   OB History    No data available     Review of Systems  All other systems reviewed and are negative.     Allergies  Avelox; Clindamycin/lincomycin; Kiwi extract; Bactrim; Levofloxacin; Spironolactone; and Sulfa antibiotics  Home Medications   Prior to Admission medications   Medication Sig Start Date End Date Taking? Authorizing Provider  acetaminophen (TYLENOL) 500 MG tablet Take 500 mg by mouth every 6 (six) hours as needed for mild pain or moderate pain.    Yes Historical Provider, MD  acetaminophen (TYLENOL) 650 MG CR tablet Take 650 mg by mouth every 8 (eight) hours as needed for pain.   Yes Historical Provider, MD  aspirin EC 81 MG tablet Take 162 mg by mouth daily.  11/22/11  Yes [name], MD  carvedilol (COREG) 12.5 MG tablet Take 1 tablet (12.5 mg total) by mouth 2 (two) times daily with a meal. 10/21/15  Yes Isaiah Serge, NP  HYDROcodone-acetaminophen (NORCO/VICODIN) 5-325 MG tablet Take 1-2 tablets by mouth every 4 (four) hours as needed. Patient taking differently: Take 1-2 tablets by mouth every 4 (four) hours as needed for moderate pain or severe pain.  04/13/16  Yes [name], MD  ibuprofen (ADVIL,MOTRIN) 800 MG tablet Take 800 mg by mouth daily as needed for moderate pain.   Yes Historical Provider, MD  levothyroxine (SYNTHROID, LEVOTHROID) 88 MCG tablet Take 88 mcg by mouth daily.    Yes Historical Provider, MD  omeprazole (PRILOSEC) 20 MG capsule Take 20 mg by mouth daily as needed (for heartburn or acid reflux).    Yes Historical Provider, MD  sacubitril-valsartan (ENTRESTO) 24-26 MG Take 1 tablet by mouth 2 (two) times daily. 01/14/16  Yes Dorothy Spark, MD  gabapentin (NEURONTIN) 100 MG capsule Take 1 capsule (100 mg total) by mouth 3 (three) times daily. 04/16/16   Noemi Chapel, MD   BP 139/86 mmHg  Pulse 69  Temp(Src) 98.1 F (36.7 C) (Oral)  Resp 16  Ht _0  (1.727 m)  Wt 199 lb (90.266 kg)  BMI 30.26 kg/m2  SpO2  100% Physical Exam  Constitutional: She appears well-developed and well-nourished. No distress.  HENT:  Head: Normocephalic and atraumatic.  Mouth/Throat: Oropharynx is clear and moist. No oropharyngeal exudate.  Eyes: Conjunctivae and EOM are normal. Pupils are equal, round, and reactive to light. Right eye exhibits no discharge. Left eye exhibits no discharge. No scleral icterus.  Neck: Neck supple. No JVD present. No thyromegaly present.  Puncture wounds of the neck are healing - no ttp, no redness, no drainage. No swelling or asymetry, no LAD of the neck.  Dec ROM of the head to the  R in lateral movement or rotation 2/2 pain.  Cardiovascular: Normal rate, regular rhythm, normal heart sounds and intact distal pulses.  Exam reveals no gallop and no friction rub.   No murmur heard. Pulmonary/Chest: Effort normal and breath sounds normal. No respiratory distress. She has no wheezes. She has no rales.  Musculoskeletal: Normal range of motion. She exhibits no edema or tenderness.  No increased pain with range of motion of the shoulder and either abduction, abduction, external or internal rotation, normal-appearing shoulder girdle on the right, no tenderness with palpation around the shoulder  Lymphadenopathy:    She has no cervical adenopathy.  Neurological: She is alert. Coordination normal.  Normal strength and sensation of the bilateral upper extremities at the major muscle groups including the shoulders biceps, triceps, forearms and grips  Skin: Skin is warm and dry. No rash noted. No erythema.  Psychiatric: She has a normal mood and affect. Her behavior is normal.  Nursing note and vitals reviewed.   ED Course  Procedures (including critical care time) Labs Review Labs Reviewed - No data to display  Imaging Review No results found. I have personally reviewed and evaluated these images and lab results as part of my medical decision-making.   EKG Interpretation   Date/Time:   Saturday April 16 2016 16:37:51 EDT Ventricular Rate:  59 PR Interval:    QRS Duration: 106 QT Interval:  439 QTC Calculation: 435 R Axis:   40 Text Interpretation:  Sinus rhythm Borderline T abnormalities, anterior  leads Since last tracing rate faster no other changes. Confirmed by Sabra Heck   MD, Nixon ([id number]) on 04/16/2016 5:28:43 PM      MDM   Final diagnoses:  Neck pain    Overall the patient is well-appearing, vital signs are unremarkable except for mild hypertension which she has consistently had through review of other vital signs throughout the past ER visits. She has pain that seems to be in the right area for the last week despite treatment despite removal of the Port-A-Cath and despite pain medications. I will do a bedside ultrasound of the neck to make sure there is no obvious DVT and an EKG to evaluate for cardiac pathology. She does not have any loss of neurologic function of the arm and it appears not to be swollen, there is no change in temperature, there is strong pulses at the radial artery and no other findings to suggest vascular or neurologic injury of that area. She may need to follow-up closely with her family doctor but at this time it does not appear to be a life-threatening condition.  ECG unremarkable  Pt given dilaudid - she is aware of need for close f/u Bedside US shows central line location is clear of clot or hematoma  These findings were d/w Dr. Lindi Adie - the pt is stable for d/c - He recommends Neurontin - pt updated  Noemi Chapel, MD 04/16/16 1743

## 2016-04-16 NOTE — ED Notes (Signed)
Pt states she has not taken her pain medication since last night.

## 2016-04-16 NOTE — Discharge Instructions (Signed)

## 2016-04-18 ENCOUNTER — Ambulatory Visit: Payer: BLUE CROSS/BLUE SHIELD | Admitting: Cardiology

## 2016-04-20 ENCOUNTER — Telehealth: Payer: Self-pay | Admitting: *Deleted

## 2016-04-20 NOTE — Telephone Encounter (Signed)
Returned call to the patient, she has decided to not have chemotherapy,had her port  Removed, saw Dr. [name] yesterday, her concern is  thaty she works for a [employer] from 7a-7p,different days off, and only can take 15-20 minute breaks during the day, she has finished her fmla  With Dr. [name],  Does she need to fill any more FMLA paper ?, Informed her our hours are 8a-5pm right now, and she should bring from her work [employer]  And MD can fill out those, but it does take 5-7 days to complete,  And get back to her ,she will discuss this further on Monday 04/25/16 with [place] 12:00 PM

## 2016-04-21 ENCOUNTER — Other Ambulatory Visit: Payer: BLUE CROSS/BLUE SHIELD

## 2016-04-21 ENCOUNTER — Telehealth: Payer: Self-pay | Admitting: Hematology and Oncology

## 2016-04-21 ENCOUNTER — Encounter: Payer: BLUE CROSS/BLUE SHIELD | Admitting: Genetic Counselor

## 2016-04-21 NOTE — Telephone Encounter (Signed)
returned call and lvm for pt confirming that i cx todays appt....advised pt to call back to r/s

## 2016-04-22 DIAGNOSIS — M25511 Pain in right shoulder: Secondary | ICD-10-CM | POA: Diagnosis not present

## 2016-04-22 DIAGNOSIS — I519 Heart disease, unspecified: Secondary | ICD-10-CM | POA: Insufficient documentation

## 2016-04-22 DIAGNOSIS — M5412 Radiculopathy, cervical region: Secondary | ICD-10-CM | POA: Diagnosis not present

## 2016-04-22 DIAGNOSIS — M542 Cervicalgia: Secondary | ICD-10-CM | POA: Diagnosis not present

## 2016-04-22 NOTE — Progress Notes (Signed)
   Invasive Ductal Carcinoma of the left Breast:  Triple Negative presentation.  Has refused chemotherapy.  To receive adjuvant Radiation Therapy  Pain:10/10 Neck and right shoulder started Prednisone 10 mg dose pack 48 tablets Friday,04-22-16, Tylenol and Ibuprofen  Lymphedema:None Mobility left [id number] to raise left arm without any difficulty.  Fatigue:Having fatigue all during the day. BP 123/72 mmHg  Pulse 60  Temp(Src) 98.2 F (36.8 C) (Oral)  Resp 16  Ht 5\' 8"  (1.727 m)  Wt 187 lb 9.6 oz (85.095 kg)  BMI 28.53 kg/m2  SpO2 100%

## 2016-04-25 ENCOUNTER — Encounter: Payer: BLUE CROSS/BLUE SHIELD | Admitting: Physician Assistant

## 2016-04-25 ENCOUNTER — Encounter: Payer: Self-pay | Admitting: Radiation Oncology

## 2016-04-25 ENCOUNTER — Ambulatory Visit
Admission: RE | Admit: 2016-04-25 | Discharge: 2016-04-25 | Disposition: A | Discharge status: Home / Self Care | Payer: BLUE CROSS/BLUE SHIELD | Source: Ambulatory Visit | Attending: Radiation Oncology | Admitting: Radiation Oncology

## 2016-04-25 VITALS — BP 123/72 | HR 60 | Temp 98.2°F | Resp 16 | Ht 68.0 in | Wt 187.6 lb

## 2016-04-25 DIAGNOSIS — Z510 Encounter for antineoplastic radiation therapy: Secondary | ICD-10-CM | POA: Diagnosis not present

## 2016-04-25 DIAGNOSIS — C50212 Malignant neoplasm of upper-inner quadrant of left female breast: Secondary | ICD-10-CM

## 2016-04-25 DIAGNOSIS — Z171 Estrogen receptor negative status [ER-]: Secondary | ICD-10-CM | POA: Diagnosis not present

## 2016-04-25 NOTE — Progress Notes (Signed)
  Radiation Oncology         [phone] ________________________________  Name: BRELAND KILCREASE MRN: [id number]  Date: 04/25/2016  DOB: [birth year]  To Whom it may concern:    This letter is to extend Ms. Denham leave until after she completes radiation therapy so that she may attend daily appointments for her care without interruptions to her regimen. She will start radiation on 05/02/16 and would not return to work until at least 06/17/16, but would tentatively return on 06/20/16 to work.     Carola Rhine, PAC

## 2016-04-25 NOTE — Progress Notes (Signed)
Radiation Oncology         [phone] ________________________________  Name: [name] [name] MRN: [id number]  Date: 04/25/2016  DOB: September 02, [id number] really didn't talk to you L  IY:MEBRA, [name] Moh, PA  [name], MD     REFERRING PHYSICIAN: [name], MD   DIAGNOSIS: The encounter diagnosis was Breast cancer of upper-inner quadrant of left female breast ([name] [name]).   HISTORY OF PRESENT ILLNESS:[name] [name] is a 55 y.o. female with triple negative invasive ductal carcinoma of the left breast. The patient had put off screening mammogram for about 2 years due to caring for family members, and had a mammogram at [facility] on 02/24/2016. This revealed an irregular hypoechoic mass in the 11 o' clock position. Subsequent ultrasound was performed and revealed a 2.4 x 1.6 x 1.9 cm mass, and in the left axilla. There was a single lymph node with a mildly thickened cortex without additional concerns for adenopathy. She underwent a biopsy the same day revealing invasive ductal carcinoma. Her lymph node was also biopsied and was benign. The hormonal status of her breast cancer was ER/PR negative and HER2 2+.  She has an ICD and sees Dr. [name] for this. This is her second ICD and it was placed in March 2016. She underwent left lumpectomy revealing invasive ductal carcinoma grade 2, measuring 2.3 cm and associated DCIS was noted. Her margins were negative, and no disease is noted in the lymph node sampled. Final pathology reveals a stage II A, T2 N0, M0 breast cancer. She has met with Dr. Lindi Adie in medical oncology who recommended chemotherapy, though is willing to consider radiotherapy. She has refused chemotherapy and would just like to receive adjuvant radiation therapy. She comes today to discuss moving forward with Dr. [name]. When she was seen on 03/09/2016, we discussed the role for 6 1/2 weeks of external radiotherapy to the left breast.    PREVIOUS RADIATION THERAPY: No   PAST MEDICAL HISTORY:    Past Medical History  Diagnosis Date  . Nonischemic cardiomyopathy ([place])     Etiology unknown, diagnosed 2008, patient has never had catheterization or nuclear study as of March, 2011  . Bradycardia   . GERD (gastroesophageal reflux disease)   . Hypothyroidism   . Dyslipidemia     LDL elevation,Patient does not want statin  . TIA (transient ischemic attack)     No CT or MRI abnormality, aspirin therapy  . Weight gain   . Leg swelling     right leg  . Pain     left leg & left knee  . Cough due to angiotensin-converting enzyme inhibitor     But patient chooses to continue ACE  . ICD (implantable cardiac defibrillator) battery depletion     Dr. [name], June, 2009(artifactual atrial tachycardia response events addressed by reprogramming in parentheses  . Dizziness     Dizziness with standing after squatting, March, 2012  . Arthritis   . Itching   . Ejection fraction < 50%   . Breast cancer ([place]) 02/24/16    Left breast cancer  . Medical history non-contributory     cardiomyopathy- icd  . Stroke ([place]) 07    tia  . CHF (congestive heart failure) ([place])   . AICD (automatic cardioverter/defibrillator) present 09    [employer]- replaced 2016     PAST SURGICAL HISTORY: Past Surgical History  Procedure Laterality Date  . Cardiac defibrillator placement  [place] no remote  . Tubal  ligation  94  . Upper gi endoscopy    . Colonoscopy    . Total thyroidectomy  2001  . Cyst excision Left     back of leg-lt   . Lateral epicondyle release  10/11/2012    Procedure: TENNIS ELBOW RELEASE;  Surgeon: [name], MD;  Location: [place];  Service: Orthopedics;  Laterality: Right;  RIGHT ELBOW: TENOTOMY ELBOW LATERAL EPICONDYLITIS TENNIS ELBOW: RADIAL TUNNEL RELEASE  . Implantable cardioverter defibrillator generator change N/A 12/29/2014    Procedure: IMPLANTABLE CARDIOVERTER DEFIBRILLATOR GENERATOR CHANGE;  Surgeon: [name], MD;  Location:  [facility] C[facility] LAB;  Service: Cardiovascular;  Laterality: N/A;  . Breast lumpectomy with radioactive seed and sentinel lymph node biopsy Left 04/05/2016    Procedure: BREAST LUMPECTOMY WITH RADIOACTIVE SEED AND SENTINEL LYMPH NODE BIOPSY;  Surgeon: [name], MD;  Location: [facility];  Service: General;  Laterality: Left;  . Portacath placement Right 04/05/2016    Procedure: INSERTION PORT-A-CATH WITH [place];  Surgeon: [name], MD;  Location: [place];  Service: General;  Laterality: Right;  right IJ  . Port-a-cath removal  04/13/2016    Procedure: MINOR REMOVAL PORT-A-CATH;  Surgeon: [name], MD;  Location: [place];  Service: General;;     FAMILY HISTORY:  Family History  Problem Relation Age of Onset  . Heart attack Mother   . Cancer Mother     breast  . Hypertension Sister   . Hypertension Brother   . Stroke Neg Hx   . Diabetes Neg Hx   . Cancer Maternal Aunt     breast    SOCIAL HISTORY:  reports that she has never smoked. She has never used smokeless tobacco. She reports that she does not drink alcohol or use illicit drugs.The patient is single and lives in [place]. She works for [employer]   ALLERGIES: Avelox; Clindamycin/lincomycin; Kiwi extract; Bactrim; Levofloxacin; Spironolactone; and Sulfa antibiotics   MEDICATIONS:  Current Outpatient Prescriptions  Medication Sig Dispense Refill  . acetaminophen (TYLENOL) 500 MG tablet Take 500 mg by mouth every 6 (six) hours as needed for mild pain or moderate pain.     [name] acetaminophen (TYLENOL) 650 MG CR tablet Take 650 mg by mouth every 8 (eight) hours as needed for pain.    [name] aspirin EC 81 MG tablet Take 162 mg by mouth daily.     . carvedilol (COREG) 12.5 MG tablet Take 1 tablet (12.5 mg total) by mouth 2 (two) times daily with a meal. 60 tablet 9  . ibuprofen (ADVIL,MOTRIN) 800 MG tablet Take 800 mg by mouth daily as needed for moderate pain.    [name] levothyroxine (SYNTHROID, LEVOTHROID) 88 MCG tablet Take  88 mcg by mouth daily.     [name] omeprazole (PRILOSEC) 20 MG capsule Take 20 mg by mouth daily as needed (for heartburn or acid reflux).     . predniSONE (STERAPRED UNI-PAK 21 TAB) 10 MG (21) TBPK tablet Take 10 mg by mouth daily. Take as directed started  Friday,04-22-16 48 tablets    . sacubitril-valsartan (ENTRESTO) 24-26 MG Take 1 tablet by mouth 2 (two) times daily. 180 tablet 3  . gabapentin (NEURONTIN) 100 MG capsule Take 1 capsule (100 mg total) by mouth 3 (three) times daily. (Patient not taking: Reported on 04/25/2016) 90 capsule 0  . HYDROcodone-acetaminophen (NORCO/VICODIN) 5-325 MG tablet Take 1-2 tablets by mouth every 4 (four) hours as needed. (Patient not taking: Reported on 04/25/2016) 40 tablet 0  .  methocarbamol (ROBAXIN) 500 MG tablet Take 1 tablet (500 mg total) by mouth 2 (two) times daily as needed for muscle spasms. (Patient not taking: Reported on 04/25/2016) 20 tablet 0   No current facility-administered medications for this encounter.    REVIEW OF SYSTEMS:  On review of systems, the patient reports that she is doing well overall. She denies any chest pain, shortness of breath, cough, fevers, chills, night sweats, unintended weight changes. She denies any bowel or bladder disturbances, and denies abdominal pain, nausea or vomiting. She denies fullness of the breast or swelling. She denies a fever. She mentions she has pain in her neck and right shoulder that she rates as a 10/10. Her orthopedic physician put her on Prednisone 10 mg dose pack 48 tablets. She began 04/22/2016 with no improvement with discomfort. She also takes Tylenol and Ibuprofen for the neck an shoulder pain. A complete review of systems is obtained and is otherwise negative.    PHYSICAL EXAM:  height is '5\' 8"'  (1.727 m) and weight is 187 lb 9.6 oz (85.095 kg). Her oral temperature is 98.2 F (36.8 C). Her blood pressure is 123/72 and her pulse is 60. Her respiration is 16 and oxygen saturation is 100%.    In general  this is a well appearing African American female in no acute distress. She is alert and oriented x4 and appropriate throughout the examination. HEENT reveals that the patient is normocephalic, atraumatic. EOMs are intact. PERRLA. Skin is intact without any evidence of gross lesions. Cardiovascular exam reveals a regular rate and rhythm, no clicks rubs or murmurs are auscultated. Chest is clear to auscultation bilaterally. Lymphatic assessment is performed and does not reveal any adenopathy in the cervical, supraclavicular, axillary, or inguinal chains. Bilateral breasts are examined and there is ecchymosis under the right axillary region. She has a periareolar incision, both intact without redness or separation. Right breast is also examined and no palpable abnormalities are noted. No bleeding or discharge is noted from the nipple. An ICD is present is the upper chest on the left. Abdomen has active bowel sounds in all quadrants and is intact. The abdomen is soft, non tender, non distended. Lower extremities are negative for pretibial pitting edema, deep calf tenderness, cyanosis or clubbing.  ECOG = 0  0 - Asymptomatic (Fully active, able to carry on all predisease activities without restriction)  1 - Symptomatic but completely ambulatory (Restricted in physically strenuous activity but ambulatory and able to carry out work of a light or sedentary nature. For example, light housework, office work)  2 - Symptomatic, <50% in bed during the day (Ambulatory and capable of all self care but unable to carry out any work activities. Up and about more than 50% of waking hours)  3 - Symptomatic, >50% in bed, but not bedbound (Capable of only limited self-care, confined to bed or chair 50% or more of waking hours)  4 - Bedbound (Completely disabled. Cannot carry on any self-care. Totally confined to bed or chair)  5 - Death   Eustace Pen MM, Creech RH, Tormey DC, et al. [phone]). "Toxicity and response criteria of the  [facility]". [place] Oncol. 5 (6): 649-55   LABORATORY DATA:  Lab Results  Component Value Date   WBC 7.9 04/12/2016   HGB 11.2* 04/12/2016   HCT 35.0* 04/12/2016   MCV 91.6 04/12/2016   PLT 192 04/12/2016   Lab Results  Component Value Date   NA 138 04/12/2016  K 4.4 04/12/2016   CL 106 04/12/2016   CO2 26 04/12/2016   No results found for: ALT, AST, GGT, ALKPHOS, BILITOT    RADIOGRAPHY: Dg Chest 2 View  04/12/2016  CLINICAL DATA:  Pt had lumpectomy on Tuesday. Left side chest pain and swelling. Pt also c/o right side pain and discomfort with port. Hx CHF, stroke EXAM: CHEST - 2 VIEW COMPARISON:  04/11/2016 FINDINGS: Right IJ port catheter and left subclavian AICD are stable in position. Lungs clear. Heart size upper limits normal.  Mildly tortuous thoracic aorta. No effusion. Visualized bones unremarkable. Surgical clips at the thoracic inlet on the left. IMPRESSION: 1. No acute disease. 2. Stable postop changes. Electronically Signed   By: Lucrezia Europe M.D.   On: 04/12/2016 22:55   Dg Chest 2 View  04/11/2016  CLINICAL DATA:  RIGHT shoulder and chest pain for 3 days since Port-A-Cath placement last Tuesday, history non ischemic cardiomyopathy, bradycardia, breast cancer, CHF EXAM: CHEST  2 VIEW COMPARISON:  04/05/2016 FINDINGS: RIGHT jugular Port-A-Cath with tip projecting over SVC. LEFT subclavian AICD with leads projecting at RIGHT atrium and RIGHT ventricle. Minimal enlargement of cardiac silhouette. Mediastinal contours and pulmonary vascularity normal. Lungs clear. No pulmonary infiltrate, pleural effusion or pneumothorax. Bones unremarkable. IMPRESSION: No acute abnormalities. Minimal enlargement of cardiac silhouette. Electronically Signed   By: Lavonia Dana M.D.   On: 04/11/2016 13:51   Nm Sentinel Node Inj-no Rpt (breast)  04/05/2016  CLINICAL DATA: Left breast cancer Sulfur colloid was injected intradermally by the nuclear medicine technologist for  breast cancer sentinel node localization.   Mm Breast Surgical Specimen  04/05/2016  CLINICAL DATA:  Post surgical excision of left breast lesion. EXAM: SPECIMEN RADIOGRAPH OF THE LEFT BREAST COMPARISON:  Previous exam(s). FINDINGS: Status post excision of the left breast. The radioactive seed and biopsy marker clip are present, completely intact, and were marked for pathology. IMPRESSION: Specimen radiograph of the left breast. Electronically Signed   By: Altamese Cabal M.D.   On: 04/05/2016 09:52   Dg Chest Port 1 View  04/05/2016  CLINICAL DATA:  Port-A-Cath placement EXAM: CHEST:  1 V ; intraoperative fluoroscopy COMPARISON:  December 22, 2014 chest radiograph and chest CT FINDINGS: Port-A-Cath tip is at the cavoatrial junction. No pneumothorax. There is no appreciable edema or consolidation. Heart is mildly enlarged with pulmonary vascularity within normal limits. Pacemaker leads are attached to the right atrium right ventricle. There are surgical clips at the cervical -thoracic junction in the midline. No adenopathy evident. IMPRESSION: Port-A-Cath tip at cavoatrial junction. No edema or consolidation. Heart mildly enlarged. No pneumothorax. Electronically Signed   By: Lowella Grip III M.D.   On: 04/05/2016 10:44   Dg Fluoro Guide Cv Line-no Report  04/05/2016  CLINICAL DATA:  FLOURO GUIDE CV LINE Fluoroscopy was utilized by the requesting physician.  No radiographic interpretation.   Mm Lt Radioactive Seed Loc Mammo Guide  04/01/2016  CLINICAL DATA:  Left breast cancer for reactive seed localization prior to surgery. EXAM: MAMMOGRAPHIC GUIDED RADIOACTIVE SEED LOCALIZATION OF THE LEFT BREAST COMPARISON:  Previous exam(s). FINDINGS: Patient presents for radioactive seed localization prior to left breast surgery. I met with the patient and we discussed the procedure of seed localization including benefits and alternatives. We discussed the high likelihood of a successful procedure. We  discussed the risks of the procedure including infection, bleeding, tissue injury and further surgery. We discussed the low dose of radioactivity involved in the procedure. Informed, written consent was given.  The usual time-out protocol was performed immediately prior to the procedure. Using mammographic guidance, sterile technique, 1% lidocaine and an I-125 radioactive seed, biopsy clip was localized using a cranial approach. The follow-up mammogram images confirm the seed in the expected location and were marked for Dr. [name]. Follow-up survey of the patient confirms presence of the radioactive seed. Order number of I-125 seed:  [id number]. Total activity:  7.116 millicuries  Reference Date: March 28, 2016 The patient tolerated the procedure well and was released from the [facility]. She was given instructions regarding seed removal. IMPRESSION: Radioactive seed localization left breast. No apparent complications. Electronically Signed   By: Abelardo Diesel M.D.   On: 04/01/2016 16:17       IMPRESSION: Ms. Capano is a 55 y.o female with Stage IIa T2 N0 invasive ductal carcinoma of the left breast. The patient does have a defibrillator on the same side. Dr. [name] believes that she could receive radiation treatment with tangent fields that would adequately avoid the defibrillator.    PLAN:  Dr. [name] discusses the findings from her pathology, and recommends moving forward with adjuvant radiotherapy.We discussed 33 fractions over 6 1/2 weeks as an outpatient. We discussed the low likelihood of secondary malignancies. We discussed the possible side effects including but not limited to skin redness and fatigue. She has declined systemic therapy with Dr. Lindi Adie, but will be followed by him following her radiotherapy completion. She is encouraged to keep [place] informed any questions or concerns that arise regarding her therapy, we will set her up for simulation today and anticipating her radiation begin next  week.  We discussed her family medical history and meeting with genetics. At this time, she is not interested in making an appointment with them but would consider it in the future.   The above documentation reflects my direct findings during this shared patient visit. Please see the separate note by Dr. [name] on this date for the remainder of the patient's plan of care.    Carola Rhine, PAC    This document serves as a record of services personally performed by Shona Simpson, PAC. It was created on her behalf by Lendon Collar, a trained medical scribe. The creation of this record is based on the scribe's personal observations and the provider's statements to them. This document has been checked and approved by the attending provider.

## 2016-04-27 DIAGNOSIS — M531 Cervicobrachial syndrome: Secondary | ICD-10-CM | POA: Diagnosis not present

## 2016-04-27 DIAGNOSIS — C50212 Malignant neoplasm of upper-inner quadrant of left female breast: Secondary | ICD-10-CM | POA: Diagnosis not present

## 2016-04-27 DIAGNOSIS — Z510 Encounter for antineoplastic radiation therapy: Secondary | ICD-10-CM | POA: Diagnosis not present

## 2016-04-27 DIAGNOSIS — M9901 Segmental and somatic dysfunction of cervical region: Secondary | ICD-10-CM | POA: Diagnosis not present

## 2016-04-27 DIAGNOSIS — Z171 Estrogen receptor negative status [ER-]: Secondary | ICD-10-CM | POA: Diagnosis not present

## 2016-04-28 ENCOUNTER — Encounter: Payer: Self-pay | Admitting: *Deleted

## 2016-04-28 DIAGNOSIS — M9901 Segmental and somatic dysfunction of cervical region: Secondary | ICD-10-CM | POA: Diagnosis not present

## 2016-04-28 DIAGNOSIS — M531 Cervicobrachial syndrome: Secondary | ICD-10-CM | POA: Diagnosis not present

## 2016-04-29 ENCOUNTER — Telehealth: Payer: Self-pay | Admitting: Hematology and Oncology

## 2016-04-29 NOTE — Telephone Encounter (Signed)
F/u appt made for Sept and calender was sent to pt by mail

## 2016-05-02 ENCOUNTER — Telehealth: Payer: Self-pay | Admitting: *Deleted

## 2016-05-02 ENCOUNTER — Ambulatory Visit
Admission: RE | Admit: 2016-05-02 | Discharge: 2016-05-02 | Disposition: A | Discharge status: Home / Self Care | Payer: BLUE CROSS/BLUE SHIELD | Source: Ambulatory Visit | Attending: Radiation Oncology | Admitting: Radiation Oncology

## 2016-05-02 DIAGNOSIS — C50212 Malignant neoplasm of upper-inner quadrant of left female breast: Secondary | ICD-10-CM | POA: Diagnosis not present

## 2016-05-02 DIAGNOSIS — Z510 Encounter for antineoplastic radiation therapy: Secondary | ICD-10-CM | POA: Diagnosis not present

## 2016-05-02 DIAGNOSIS — M531 Cervicobrachial syndrome: Secondary | ICD-10-CM | POA: Diagnosis not present

## 2016-05-02 DIAGNOSIS — M9901 Segmental and somatic dysfunction of cervical region: Secondary | ICD-10-CM | POA: Diagnosis not present

## 2016-05-02 DIAGNOSIS — Z171 Estrogen receptor negative status [ER-]: Secondary | ICD-10-CM | POA: Diagnosis not present

## 2016-05-02 NOTE — Telephone Encounter (Signed)
[facility] scientific [phone], called and spoke with Anderson Malta  And requested rep come tomorrow at 3pm to check patient's icd prior to and after 1st rad tx, she took all info and will have the rep for [facility] call us at [phone] [facility] 8:07 AM

## 2016-05-02 NOTE — Telephone Encounter (Signed)
[name] [name] from [employer] confirmed they will send someone here tomorrow 05/03/16 , to check [name] [name] ICD at 3pm 8:14 AM

## 2016-05-03 ENCOUNTER — Ambulatory Visit
Admission: RE | Admit: 2016-05-03 | Discharge: 2016-05-03 | Disposition: A | Discharge status: Home / Self Care | Payer: BLUE CROSS/BLUE SHIELD | Source: Ambulatory Visit | Attending: Radiation Oncology | Admitting: Radiation Oncology

## 2016-05-03 DIAGNOSIS — M531 Cervicobrachial syndrome: Secondary | ICD-10-CM | POA: Diagnosis not present

## 2016-05-03 DIAGNOSIS — Z510 Encounter for antineoplastic radiation therapy: Secondary | ICD-10-CM | POA: Diagnosis not present

## 2016-05-03 DIAGNOSIS — M9901 Segmental and somatic dysfunction of cervical region: Secondary | ICD-10-CM | POA: Diagnosis not present

## 2016-05-03 DIAGNOSIS — Z171 Estrogen receptor negative status [ER-]: Secondary | ICD-10-CM | POA: Diagnosis not present

## 2016-05-03 DIAGNOSIS — C50212 Malignant neoplasm of upper-inner quadrant of left female breast: Secondary | ICD-10-CM | POA: Diagnosis not present

## 2016-05-04 ENCOUNTER — Inpatient Hospital Stay: Admission: RE | Admit: 2016-05-04 | Payer: Self-pay | Source: Ambulatory Visit

## 2016-05-04 ENCOUNTER — Ambulatory Visit
Admission: RE | Admit: 2016-05-04 | Discharge: 2016-05-04 | Disposition: A | Discharge status: Home / Self Care | Payer: BLUE CROSS/BLUE SHIELD | Source: Ambulatory Visit | Attending: Radiation Oncology | Admitting: Radiation Oncology

## 2016-05-04 DIAGNOSIS — M531 Cervicobrachial syndrome: Secondary | ICD-10-CM | POA: Diagnosis not present

## 2016-05-04 DIAGNOSIS — Z171 Estrogen receptor negative status [ER-]: Secondary | ICD-10-CM | POA: Diagnosis not present

## 2016-05-04 DIAGNOSIS — M9901 Segmental and somatic dysfunction of cervical region: Secondary | ICD-10-CM | POA: Diagnosis not present

## 2016-05-04 DIAGNOSIS — C50212 Malignant neoplasm of upper-inner quadrant of left female breast: Secondary | ICD-10-CM | POA: Diagnosis not present

## 2016-05-04 DIAGNOSIS — Z510 Encounter for antineoplastic radiation therapy: Secondary | ICD-10-CM | POA: Diagnosis not present

## 2016-05-05 ENCOUNTER — Ambulatory Visit
Admission: RE | Admit: 2016-05-05 | Discharge: 2016-05-05 | Disposition: A | Discharge status: Home / Self Care | Payer: BLUE CROSS/BLUE SHIELD | Source: Ambulatory Visit | Attending: Radiation Oncology | Admitting: Radiation Oncology

## 2016-05-05 DIAGNOSIS — M9901 Segmental and somatic dysfunction of cervical region: Secondary | ICD-10-CM | POA: Diagnosis not present

## 2016-05-05 DIAGNOSIS — Z510 Encounter for antineoplastic radiation therapy: Secondary | ICD-10-CM | POA: Diagnosis not present

## 2016-05-05 DIAGNOSIS — C50212 Malignant neoplasm of upper-inner quadrant of left female breast: Secondary | ICD-10-CM

## 2016-05-05 DIAGNOSIS — M531 Cervicobrachial syndrome: Secondary | ICD-10-CM | POA: Diagnosis not present

## 2016-05-05 DIAGNOSIS — Z171 Estrogen receptor negative status [ER-]: Secondary | ICD-10-CM | POA: Diagnosis not present

## 2016-05-05 MED ORDER — ALRA NON-METALLIC DEODORANT (RAD-ONC): 1.0000 "application " | Freq: Once | TOPICAL | Status: DC

## 2016-05-05 MED ORDER — RADIAPLEXRX EX GEL: Freq: Once | CUTANEOUS | Status: DC

## 2016-05-05 NOTE — Progress Notes (Signed)
Pt teaching done earlier, radiation therapy and you book given along with my business card,radiaplex gel cream, alra deodorant, discussed fatiue,skin irritation, pain, swelling, soreness, use of electric razor only, increase protein in diet, stay hydrated,drink plenty fluids. Teach back given 3:42 PM

## 2016-05-06 ENCOUNTER — Ambulatory Visit
Admission: RE | Admit: 2016-05-06 | Discharge: 2016-05-06 | Disposition: A | Discharge status: Home / Self Care | Payer: BLUE CROSS/BLUE SHIELD | Source: Ambulatory Visit | Attending: Radiation Oncology | Admitting: Radiation Oncology

## 2016-05-06 DIAGNOSIS — Z510 Encounter for antineoplastic radiation therapy: Secondary | ICD-10-CM | POA: Diagnosis not present

## 2016-05-06 DIAGNOSIS — Z171 Estrogen receptor negative status [ER-]: Secondary | ICD-10-CM | POA: Diagnosis not present

## 2016-05-06 DIAGNOSIS — Z6830 Body mass index (BMI) 30.0-30.9, adult: Secondary | ICD-10-CM | POA: Diagnosis not present

## 2016-05-06 DIAGNOSIS — C50212 Malignant neoplasm of upper-inner quadrant of left female breast: Secondary | ICD-10-CM | POA: Diagnosis not present

## 2016-05-06 NOTE — Progress Notes (Signed)
Ms. Minot has received 4 fractions to her left breast.  No skin changes noted at this time. No fatigue.

## 2016-05-07 NOTE — Progress Notes (Signed)
   Department of Radiation Oncology  Phone:  [phone] Fax:        [phone]  Weekly Treatment Note    Name: [name] [name] Date: 05/07/2016 MRN: [id number] DOB: [birth year]   Diagnosis:     ICD-9-CM ICD-10-CM   1. Breast cancer of upper-inner quadrant of left female breast (HCC) 174.2 C50.212      Current dose: 7.2 Gy  Current fraction: 4   MEDICATIONS: Current Outpatient Prescriptions  Medication Sig Dispense Refill  . acetaminophen (TYLENOL) 500 MG tablet Take 500 mg by mouth every 6 (six) hours as needed for mild pain or moderate pain.     [name] aspirin EC 81 MG tablet Take 162 mg by mouth daily.     . carvedilol (COREG) 12.5 MG tablet Take 1 tablet (12.5 mg total) by mouth 2 (two) times daily with a meal. 60 tablet 9  . gabapentin (NEURONTIN) 100 MG capsule Take 1 capsule (100 mg total) by mouth 3 (three) times daily. 90 capsule 0  . hyaluronate sodium (RADIAPLEXRX) GEL Apply 1 application topically 2 (two) times daily.    [name] ibuprofen (ADVIL,MOTRIN) 800 MG tablet Take 800 mg by mouth daily as needed for moderate pain.    [name] levothyroxine (SYNTHROID, LEVOTHROID) 88 MCG tablet Take 88 mcg by mouth daily.     . non-metallic deodorant Jethro Poling) MISC Apply 1 application topically daily as needed.    [name] omeprazole (PRILOSEC) 20 MG capsule Take 20 mg by mouth daily as needed (for heartburn or acid reflux).     . sacubitril-valsartan (ENTRESTO) 24-26 MG Take 1 tablet by mouth 2 (two) times daily. 180 tablet 3  . HYDROcodone-acetaminophen (NORCO/VICODIN) 5-325 MG tablet Take 1-2 tablets by mouth every 4 (four) hours as needed. (Patient not taking: Reported on 04/25/2016) 40 tablet 0   No current facility-administered medications for this encounter.     ALLERGIES: Avelox; Clindamycin/lincomycin; Kiwi extract; Bactrim; Levofloxacin; Spironolactone; and Sulfa antibiotics   LABORATORY DATA:  Lab Results  Component Value Date   WBC 7.9 04/12/2016   HGB 11.2* 04/12/2016   HCT 35.0*  04/12/2016   MCV 91.6 04/12/2016   PLT 192 04/12/2016   Lab Results  Component Value Date   NA 138 04/12/2016   K 4.4 04/12/2016   CL 106 04/12/2016   CO2 26 04/12/2016   No results found for: ALT, AST, GGT, ALKPHOS, BILITOT   NARRATIVE: [name] [name] was seen today for weekly treatment management. The chart was checked and the patient's films were reviewed.  Ms. Haslip has received 4 fractions to her left breast.  No skin changes noted at this time. No fatigue.   PHYSICAL EXAMINATION: vitals were not taken for this visit.     Alert, no acute distress  ASSESSMENT: The patient is doing satisfactorily with treatment.  PLAN: We will continue with the patient's radiation treatment as planned.

## 2016-05-09 ENCOUNTER — Ambulatory Visit
Admission: RE | Admit: 2016-05-09 | Discharge: 2016-05-09 | Disposition: A | Discharge status: Home / Self Care | Payer: BLUE CROSS/BLUE SHIELD | Source: Ambulatory Visit | Attending: Radiation Oncology | Admitting: Radiation Oncology

## 2016-05-09 DIAGNOSIS — C50212 Malignant neoplasm of upper-inner quadrant of left female breast: Secondary | ICD-10-CM | POA: Diagnosis not present

## 2016-05-09 DIAGNOSIS — M531 Cervicobrachial syndrome: Secondary | ICD-10-CM | POA: Diagnosis not present

## 2016-05-09 DIAGNOSIS — Z510 Encounter for antineoplastic radiation therapy: Secondary | ICD-10-CM | POA: Diagnosis not present

## 2016-05-09 DIAGNOSIS — Z171 Estrogen receptor negative status [ER-]: Secondary | ICD-10-CM | POA: Diagnosis not present

## 2016-05-09 DIAGNOSIS — M9901 Segmental and somatic dysfunction of cervical region: Secondary | ICD-10-CM | POA: Diagnosis not present

## 2016-05-10 ENCOUNTER — Ambulatory Visit
Admission: RE | Admit: 2016-05-10 | Discharge: 2016-05-10 | Disposition: A | Discharge status: Home / Self Care | Payer: BLUE CROSS/BLUE SHIELD | Source: Ambulatory Visit | Attending: Radiation Oncology | Admitting: Radiation Oncology

## 2016-05-10 ENCOUNTER — Telehealth: Payer: Self-pay | Admitting: *Deleted

## 2016-05-10 DIAGNOSIS — C50212 Malignant neoplasm of upper-inner quadrant of left female breast: Secondary | ICD-10-CM | POA: Diagnosis not present

## 2016-05-10 DIAGNOSIS — Z171 Estrogen receptor negative status [ER-]: Secondary | ICD-10-CM | POA: Diagnosis not present

## 2016-05-10 DIAGNOSIS — Z510 Encounter for antineoplastic radiation therapy: Secondary | ICD-10-CM | POA: Diagnosis not present

## 2016-05-10 NOTE — Telephone Encounter (Signed)
  Oncology Nurse Navigator Documentation  Navigator Location: [facility] (05/10/16 1300) Navigator Encounter Type: Telephone (05/10/16 1300) Telephone: [place] Call (05/10/16 1300)         Patient Visit Type: [id number] (05/10/16 1300) Treatment Phase: First Radiation Tx (05/10/16 1300)                            Time Spent with Patient: 15 (05/10/16 1300)

## 2016-05-11 ENCOUNTER — Ambulatory Visit
Admission: RE | Admit: 2016-05-11 | Discharge: 2016-05-11 | Disposition: A | Discharge status: Home / Self Care | Payer: BLUE CROSS/BLUE SHIELD | Source: Ambulatory Visit | Attending: Radiation Oncology | Admitting: Radiation Oncology

## 2016-05-11 DIAGNOSIS — M9901 Segmental and somatic dysfunction of cervical region: Secondary | ICD-10-CM | POA: Diagnosis not present

## 2016-05-11 DIAGNOSIS — Z510 Encounter for antineoplastic radiation therapy: Secondary | ICD-10-CM | POA: Diagnosis not present

## 2016-05-11 DIAGNOSIS — M531 Cervicobrachial syndrome: Secondary | ICD-10-CM | POA: Diagnosis not present

## 2016-05-11 DIAGNOSIS — C50212 Malignant neoplasm of upper-inner quadrant of left female breast: Secondary | ICD-10-CM | POA: Diagnosis not present

## 2016-05-11 DIAGNOSIS — Z171 Estrogen receptor negative status [ER-]: Secondary | ICD-10-CM | POA: Diagnosis not present

## 2016-05-12 ENCOUNTER — Ambulatory Visit
Admission: RE | Admit: 2016-05-12 | Discharge: 2016-05-12 | Disposition: A | Discharge status: Home / Self Care | Payer: BLUE CROSS/BLUE SHIELD | Source: Ambulatory Visit | Attending: Radiation Oncology | Admitting: Radiation Oncology

## 2016-05-12 ENCOUNTER — Encounter: Payer: Self-pay | Admitting: Radiation Oncology

## 2016-05-12 DIAGNOSIS — C50212 Malignant neoplasm of upper-inner quadrant of left female breast: Secondary | ICD-10-CM | POA: Diagnosis not present

## 2016-05-12 DIAGNOSIS — Z510 Encounter for antineoplastic radiation therapy: Secondary | ICD-10-CM | POA: Diagnosis not present

## 2016-05-12 DIAGNOSIS — M531 Cervicobrachial syndrome: Secondary | ICD-10-CM | POA: Diagnosis not present

## 2016-05-12 DIAGNOSIS — M9901 Segmental and somatic dysfunction of cervical region: Secondary | ICD-10-CM | POA: Diagnosis not present

## 2016-05-12 DIAGNOSIS — Z171 Estrogen receptor negative status [ER-]: Secondary | ICD-10-CM | POA: Diagnosis not present

## 2016-05-12 NOTE — Progress Notes (Signed)
Faxed completed FMLA paperwork to Darlyn Chamber at [phone] and copied for scanning

## 2016-05-13 ENCOUNTER — Encounter: Payer: Self-pay | Admitting: Radiation Oncology

## 2016-05-13 ENCOUNTER — Ambulatory Visit
Admission: RE | Admit: 2016-05-13 | Discharge: 2016-05-13 | Disposition: A | Discharge status: Home / Self Care | Payer: BLUE CROSS/BLUE SHIELD | Source: Ambulatory Visit | Attending: Radiation Oncology | Admitting: Radiation Oncology

## 2016-05-13 VITALS — BP 106/59 | HR 74 | Temp 98.5°F | Ht 68.0 in | Wt 204.0 lb

## 2016-05-13 DIAGNOSIS — C50212 Malignant neoplasm of upper-inner quadrant of left female breast: Secondary | ICD-10-CM

## 2016-05-13 DIAGNOSIS — Z510 Encounter for antineoplastic radiation therapy: Secondary | ICD-10-CM | POA: Diagnosis not present

## 2016-05-13 DIAGNOSIS — Z171 Estrogen receptor negative status [ER-]: Secondary | ICD-10-CM | POA: Diagnosis not present

## 2016-05-13 NOTE — Progress Notes (Signed)
[name] [name] has received 9 fractions to her left breast.  No marked changes in her site at this time.  She reports intermittent shooting pains in her left breast, but not at this time. Reports mild fatigue without napping.

## 2016-05-14 NOTE — Progress Notes (Signed)
   Department of Radiation Oncology  Phone:  [phone] Fax:        [phone]  Weekly Treatment Note    Name: [name] [name] Date: 05/14/2016 MRN: [id number] DOB: [birth year]   Diagnosis:     ICD-9-CM ICD-10-CM   1. Breast cancer of upper-inner quadrant of left female breast (HCC) 174.2 C50.212      Current dose: 17.2 Gy  Current fraction: 9   MEDICATIONS: Current Outpatient Prescriptions  Medication Sig Dispense Refill  . aspirin EC 81 MG tablet Take 162 mg by mouth daily.     . carvedilol (COREG) 12.5 MG tablet Take 1 tablet (12.5 mg total) by mouth 2 (two) times daily with a meal. 60 tablet 9  . hyaluronate sodium (RADIAPLEXRX) GEL Apply 1 application topically 2 (two) times daily.    [name] levothyroxine (SYNTHROID, LEVOTHROID) 88 MCG tablet Take 88 mcg by mouth daily.     . non-metallic deodorant Jethro Poling) MISC Apply 1 application topically daily as needed.    [name] omeprazole (PRILOSEC) 20 MG capsule Take 20 mg by mouth daily as needed (for heartburn or acid reflux).     . sacubitril-valsartan (ENTRESTO) 24-26 MG Take 1 tablet by mouth 2 (two) times daily. 180 tablet 3  . HYDROcodone-acetaminophen (NORCO/VICODIN) 5-325 MG tablet Take 1-2 tablets by mouth every 4 (four) hours as needed. (Patient not taking: Reported on 04/25/2016) 40 tablet 0   No current facility-administered medications for this encounter.     ALLERGIES: Avelox; Clindamycin/lincomycin; Kiwi extract; Bactrim; Levofloxacin; Spironolactone; and Sulfa antibiotics   LABORATORY DATA:  Lab Results  Component Value Date   WBC 7.9 04/12/2016   HGB 11.2* 04/12/2016   HCT 35.0* 04/12/2016   MCV 91.6 04/12/2016   PLT 192 04/12/2016   Lab Results  Component Value Date   NA 138 04/12/2016   K 4.4 04/12/2016   CL 106 04/12/2016   CO2 26 04/12/2016   No results found for: ALT, AST, GGT, ALKPHOS, BILITOT   NARRATIVE: [name] [name] was seen today for weekly treatment management. The chart was checked and the  patient's films were reviewed.  [name] [name] has received 9 fractions to her left breast.  No marked changes in her site at this time.  She reports intermittent shooting pains in her left breast, but not at this time. Reports mild fatigue without napping.  PHYSICAL EXAMINATION: height is 5\' 8"  (1.727 m) and weight is 204 lb (92.534 kg). Her temperature is 98.5 F (36.9 C). Her blood pressure is 106/59 and her pulse is 74.        ASSESSMENT: The patient is doing satisfactorily with treatment.  PLAN: We will continue with the patient's radiation treatment as planned.

## 2016-05-14 NOTE — Progress Notes (Signed)
  Radiation Oncology         [phone]) [phone] ________________________________  Name: OAKLIE LIMBAUGH MRN: [id number]  Date: 04/25/2016  DOB: June 13, 1961  DIAGNOSIS:     ICD-9-CM ICD-10-CM   1. Breast cancer of upper-inner quadrant of left female breast ([place]) 174.2 C50.212      SIMULATION AND TREATMENT PLANNING NOTE  The patient presented for simulation prior to beginning her course of radiation treatment for her diagnosis of Left-sided breast cancer. The patient was placed in a supine position on a breast board. A customized vac-lock bag was constructed and this complex treatment device will be used on a daily basis during her treatment. In this fashion, a CT scan was obtained through the chest area and an isocenter was placed near the chest wall within the breast.  The patient will be planned to receive a course of radiation initially to a dose of 50.4 Gy. This will consist of a whole breast radiotherapy technique. To accomplish this, 2 customized blocks have been designed which will correspond to medial and lateral whole breast tangent fields. This treatment will be accomplished at 1.8 Gy per fraction. A forward planning technique will also be evaluated to determine if this approach improves the plan. It is anticipated that the patient will then receive a 10 Gy boost to the seroma cavity which has been contoured. This will be accomplished at 2 Gy per fraction.   This initial treatment will consist of a 3-D conformal technique. The seroma has been contoured as the primary target structure. Additionally, dose volume histograms of both this target as well as the lungs and heart will also be evaluated. Such an approach is necessary to ensure that the target area is adequately covered while the nearby critical  normal structures are adequately spared.  Plan:  The final anticipated total dose therefore will correspond to 60.4 Gy.   Special treatment procedure was performed today due to the extra time and  effort required by myself to plan and prepare this patient for deep inspiration breath hold technique.  I have determined cardiac sparing to be of benefit to this patient to prevent long term cardiac damage due to radiation of the heart.  Bellows were placed on the patient's abdomen. To facilitate cardiac sparing, the patient was coached by the radiation therapists on breath hold techniques and breathing practice was performed. Practice waveforms were obtained. The patient was then scanned while maintaining breath hold in the treatment position.  This image was then transferred over to the imaging specialist. The imaging specialist then created a fusion of the free breathing and breath hold scans using the chest wall as the stable structure. I personally reviewed the fusion in axial, coronal and sagittal image planes.  Excellent cardiac sparing was obtained.  I felt the patient is an appropriate candidate for breath hold and the patient will be treated as such.  The image fusion was then reviewed with the patient to reinforce the necessity of reproducible breath hold.      _______________________________   Jodelle Gross, MD, PhD

## 2016-05-14 NOTE — Progress Notes (Signed)
  Radiation Oncology         [phone] ________________________________  Name: [name] [name] MRN: [id number]  Date: 04/25/2016  DOB: [birth year]  Optical Surface Tracking Plan:  Since intensity modulated radiotherapy (IMRT) and 3D conformal radiation treatment methods are predicated on accurate and precise positioning for treatment, intrafraction motion monitoring is medically necessary to ensure accurate and safe treatment delivery.  The ability to quantify intrafraction motion without excessive ionizing radiation dose can only be performed with optical surface tracking. Accordingly, surface imaging offers the opportunity to obtain 3D measurements of patient position throughout IMRT and 3D treatments without excessive radiation exposure.  I am ordering optical surface tracking for this patient's upcoming course of radiotherapy. ________________________________  Kyung Rudd, MD 05/14/2016 12:03 PM    Reference:   Ursula Alert, J, et al. Surface imaging-based analysis of intrafraction motion for breast radiotherapy patients.Journal of [place], n. 6, nov. 2014. ISSN [id number].   Available at: <http://www.jacmp.org/index.php/jacmp/article/view/4957>.

## 2016-05-16 ENCOUNTER — Ambulatory Visit
Admission: RE | Admit: 2016-05-16 | Discharge: 2016-05-16 | Disposition: A | Discharge status: Home / Self Care | Payer: BLUE CROSS/BLUE SHIELD | Source: Ambulatory Visit | Attending: Radiation Oncology | Admitting: Radiation Oncology

## 2016-05-16 DIAGNOSIS — M9901 Segmental and somatic dysfunction of cervical region: Secondary | ICD-10-CM | POA: Diagnosis not present

## 2016-05-16 DIAGNOSIS — M531 Cervicobrachial syndrome: Secondary | ICD-10-CM | POA: Diagnosis not present

## 2016-05-16 DIAGNOSIS — Z510 Encounter for antineoplastic radiation therapy: Secondary | ICD-10-CM | POA: Diagnosis not present

## 2016-05-16 DIAGNOSIS — C50212 Malignant neoplasm of upper-inner quadrant of left female breast: Secondary | ICD-10-CM | POA: Diagnosis not present

## 2016-05-16 DIAGNOSIS — Z171 Estrogen receptor negative status [ER-]: Secondary | ICD-10-CM | POA: Diagnosis not present

## 2016-05-17 ENCOUNTER — Ambulatory Visit
Admission: RE | Admit: 2016-05-17 | Discharge: 2016-05-17 | Disposition: A | Discharge status: Home / Self Care | Payer: BLUE CROSS/BLUE SHIELD | Source: Ambulatory Visit | Attending: Radiation Oncology | Admitting: Radiation Oncology

## 2016-05-17 DIAGNOSIS — Z510 Encounter for antineoplastic radiation therapy: Secondary | ICD-10-CM | POA: Diagnosis not present

## 2016-05-17 DIAGNOSIS — C50212 Malignant neoplasm of upper-inner quadrant of left female breast: Secondary | ICD-10-CM | POA: Diagnosis not present

## 2016-05-17 DIAGNOSIS — Z171 Estrogen receptor negative status [ER-]: Secondary | ICD-10-CM | POA: Diagnosis not present

## 2016-05-18 ENCOUNTER — Ambulatory Visit
Admission: RE | Admit: 2016-05-18 | Discharge: 2016-05-18 | Disposition: A | Discharge status: Home / Self Care | Payer: BLUE CROSS/BLUE SHIELD | Source: Ambulatory Visit | Attending: Radiation Oncology | Admitting: Radiation Oncology

## 2016-05-18 DIAGNOSIS — Z171 Estrogen receptor negative status [ER-]: Secondary | ICD-10-CM | POA: Diagnosis not present

## 2016-05-18 DIAGNOSIS — C50212 Malignant neoplasm of upper-inner quadrant of left female breast: Secondary | ICD-10-CM | POA: Diagnosis not present

## 2016-05-18 DIAGNOSIS — M531 Cervicobrachial syndrome: Secondary | ICD-10-CM | POA: Diagnosis not present

## 2016-05-18 DIAGNOSIS — M9901 Segmental and somatic dysfunction of cervical region: Secondary | ICD-10-CM | POA: Diagnosis not present

## 2016-05-18 DIAGNOSIS — Z510 Encounter for antineoplastic radiation therapy: Secondary | ICD-10-CM | POA: Diagnosis not present

## 2016-05-19 ENCOUNTER — Ambulatory Visit
Admission: RE | Admit: 2016-05-19 | Discharge: 2016-05-19 | Disposition: A | Discharge status: Home / Self Care | Payer: BLUE CROSS/BLUE SHIELD | Source: Ambulatory Visit | Attending: Radiation Oncology | Admitting: Radiation Oncology

## 2016-05-19 DIAGNOSIS — Z510 Encounter for antineoplastic radiation therapy: Secondary | ICD-10-CM | POA: Diagnosis not present

## 2016-05-19 DIAGNOSIS — C50212 Malignant neoplasm of upper-inner quadrant of left female breast: Secondary | ICD-10-CM | POA: Diagnosis not present

## 2016-05-19 DIAGNOSIS — Z171 Estrogen receptor negative status [ER-]: Secondary | ICD-10-CM | POA: Diagnosis not present

## 2016-05-19 DIAGNOSIS — M531 Cervicobrachial syndrome: Secondary | ICD-10-CM | POA: Diagnosis not present

## 2016-05-19 DIAGNOSIS — M9901 Segmental and somatic dysfunction of cervical region: Secondary | ICD-10-CM | POA: Diagnosis not present

## 2016-05-20 ENCOUNTER — Ambulatory Visit
Admission: RE | Admit: 2016-05-20 | Discharge: 2016-05-20 | Disposition: A | Discharge status: Home / Self Care | Payer: BLUE CROSS/BLUE SHIELD | Source: Ambulatory Visit | Attending: Radiation Oncology | Admitting: Radiation Oncology

## 2016-05-20 DIAGNOSIS — C50212 Malignant neoplasm of upper-inner quadrant of left female breast: Secondary | ICD-10-CM

## 2016-05-20 DIAGNOSIS — Z510 Encounter for antineoplastic radiation therapy: Secondary | ICD-10-CM | POA: Diagnosis not present

## 2016-05-20 DIAGNOSIS — Z171 Estrogen receptor negative status [ER-]: Secondary | ICD-10-CM | POA: Diagnosis not present

## 2016-05-20 NOTE — Progress Notes (Signed)
   Department of Radiation Oncology  Phone:  [phone] Fax:        [phone]  Weekly Treatment Note    Name: [name] [name] Date: 05/20/2016 MRN: [id number] DOB: 09/11/1961   Diagnosis:     ICD-9-CM ICD-10-CM   1. Breast cancer of upper-inner quadrant of left female breast ([facility]) 174.2 C50.212      Current dose: 24.2 Gy  Current fraction: 14   MEDICATIONS: Current Outpatient Prescriptions  Medication Sig Dispense Refill  . aspirin EC 81 MG tablet Take 162 mg by mouth daily.     . carvedilol (COREG) 12.5 MG tablet Take 1 tablet (12.5 mg total) by mouth 2 (two) times daily with a meal. 60 tablet 9  . hyaluronate sodium (RADIAPLEXRX) GEL Apply 1 application topically 2 (two) times daily.    [name] HYDROcodone-acetaminophen (NORCO/VICODIN) 5-325 MG tablet Take 1-2 tablets by mouth every 4 (four) hours as needed. (Patient not taking: Reported on 04/25/2016) 40 tablet 0  . levothyroxine (SYNTHROID, LEVOTHROID) 88 MCG tablet Take 88 mcg by mouth daily.     . non-metallic deodorant Jethro Poling) MISC Apply 1 application topically daily as needed.    [name] omeprazole (PRILOSEC) 20 MG capsule Take 20 mg by mouth daily as needed (for heartburn or acid reflux).     . sacubitril-valsartan (ENTRESTO) 24-26 MG Take 1 tablet by mouth 2 (two) times daily. 180 tablet 3   No current facility-administered medications for this encounter.      ALLERGIES: Avelox [moxifloxacin hcl in nacl]; Clindamycin/lincomycin; Kiwi extract; Bactrim [sulfamethoxazole-trimethoprim]; Levofloxacin; Spironolactone; and Sulfa antibiotics   LABORATORY DATA:  Lab Results  Component Value Date   WBC 7.9 04/12/2016   HGB 11.2 (L) 04/12/2016   HCT 35.0 (L) 04/12/2016   MCV 91.6 04/12/2016   PLT 192 04/12/2016   Lab Results  Component Value Date   NA 138 04/12/2016   K 4.4 04/12/2016   CL 106 04/12/2016   CO2 26 04/12/2016   No results found for: ALT, AST, GGT, ALKPHOS, BILITOT   NARRATIVE: [name] [name] was seen today  for weekly treatment management. The chart was checked and the patient's films were reviewed.  [name] [name] has received 9 fractions to her left breast.  No marked changes in her site at this time.  She reports intermittent shooting pains in her left breast, but not at this time. Reports mild fatigue without napping.  PHYSICAL EXAMINATION: vitals were not taken for this visit.       ASSESSMENT: The patient is doing satisfactorily with treatment.  PLAN: We will continue with the patient's radiation treatment as planned.    ------------------------------------------------   Tyler Pita, MD [facility] Director and Director of Stereotactic Radiosurgery Direct Dial: [phone]  Fax: [phone] [email or web address]  Skype  [email or web address]     This document serves as a record of services personally performed by Tyler Pita, MD. It was created on his behalf by Truddie Hidden, a trained medical scribe. The creation of this record is based on the scribe's personal observations and the provider's statements to them. This document has been checked and approved by the attending provider.

## 2016-05-23 ENCOUNTER — Ambulatory Visit
Admission: RE | Admit: 2016-05-23 | Discharge: 2016-05-23 | Disposition: A | Discharge status: Home / Self Care | Payer: BLUE CROSS/BLUE SHIELD | Source: Ambulatory Visit | Attending: Radiation Oncology | Admitting: Radiation Oncology

## 2016-05-23 DIAGNOSIS — Z510 Encounter for antineoplastic radiation therapy: Secondary | ICD-10-CM | POA: Diagnosis not present

## 2016-05-23 DIAGNOSIS — M9901 Segmental and somatic dysfunction of cervical region: Secondary | ICD-10-CM | POA: Diagnosis not present

## 2016-05-23 DIAGNOSIS — C50212 Malignant neoplasm of upper-inner quadrant of left female breast: Secondary | ICD-10-CM | POA: Diagnosis not present

## 2016-05-23 DIAGNOSIS — M531 Cervicobrachial syndrome: Secondary | ICD-10-CM | POA: Diagnosis not present

## 2016-05-23 DIAGNOSIS — Z171 Estrogen receptor negative status [ER-]: Secondary | ICD-10-CM | POA: Diagnosis not present

## 2016-05-24 ENCOUNTER — Ambulatory Visit
Admission: RE | Admit: 2016-05-24 | Discharge: 2016-05-24 | Disposition: A | Discharge status: Home / Self Care | Payer: BLUE CROSS/BLUE SHIELD | Source: Ambulatory Visit | Attending: Radiation Oncology | Admitting: Radiation Oncology

## 2016-05-24 DIAGNOSIS — Z510 Encounter for antineoplastic radiation therapy: Secondary | ICD-10-CM | POA: Diagnosis not present

## 2016-05-24 DIAGNOSIS — Z171 Estrogen receptor negative status [ER-]: Secondary | ICD-10-CM | POA: Diagnosis not present

## 2016-05-24 DIAGNOSIS — C50919 Malignant neoplasm of unspecified site of unspecified female breast: Secondary | ICD-10-CM | POA: Diagnosis not present

## 2016-05-24 DIAGNOSIS — I429 Cardiomyopathy, unspecified: Secondary | ICD-10-CM | POA: Diagnosis not present

## 2016-05-24 DIAGNOSIS — L0291 Cutaneous abscess, unspecified: Secondary | ICD-10-CM | POA: Diagnosis not present

## 2016-05-24 DIAGNOSIS — C50212 Malignant neoplasm of upper-inner quadrant of left female breast: Secondary | ICD-10-CM | POA: Diagnosis not present

## 2016-05-24 DIAGNOSIS — I5020 Unspecified systolic (congestive) heart failure: Secondary | ICD-10-CM | POA: Diagnosis not present

## 2016-05-25 ENCOUNTER — Ambulatory Visit
Admission: RE | Admit: 2016-05-25 | Discharge: 2016-05-25 | Disposition: A | Discharge status: Home / Self Care | Payer: BLUE CROSS/BLUE SHIELD | Source: Ambulatory Visit | Attending: Radiation Oncology | Admitting: Radiation Oncology

## 2016-05-25 DIAGNOSIS — C50212 Malignant neoplasm of upper-inner quadrant of left female breast: Secondary | ICD-10-CM | POA: Diagnosis not present

## 2016-05-25 DIAGNOSIS — Z171 Estrogen receptor negative status [ER-]: Secondary | ICD-10-CM | POA: Diagnosis not present

## 2016-05-25 DIAGNOSIS — Z510 Encounter for antineoplastic radiation therapy: Secondary | ICD-10-CM | POA: Diagnosis not present

## 2016-05-26 ENCOUNTER — Ambulatory Visit
Admission: RE | Admit: 2016-05-26 | Discharge: 2016-05-26 | Disposition: A | Discharge status: Home / Self Care | Payer: BLUE CROSS/BLUE SHIELD | Source: Ambulatory Visit | Attending: Radiation Oncology | Admitting: Radiation Oncology

## 2016-05-26 DIAGNOSIS — M9901 Segmental and somatic dysfunction of cervical region: Secondary | ICD-10-CM | POA: Diagnosis not present

## 2016-05-26 DIAGNOSIS — M531 Cervicobrachial syndrome: Secondary | ICD-10-CM | POA: Diagnosis not present

## 2016-05-26 DIAGNOSIS — C50212 Malignant neoplasm of upper-inner quadrant of left female breast: Secondary | ICD-10-CM | POA: Diagnosis not present

## 2016-05-26 DIAGNOSIS — Z171 Estrogen receptor negative status [ER-]: Secondary | ICD-10-CM | POA: Diagnosis not present

## 2016-05-26 DIAGNOSIS — Z510 Encounter for antineoplastic radiation therapy: Secondary | ICD-10-CM | POA: Diagnosis not present

## 2016-05-27 ENCOUNTER — Encounter: Payer: Self-pay | Admitting: Radiation Oncology

## 2016-05-27 ENCOUNTER — Ambulatory Visit
Admission: RE | Admit: 2016-05-27 | Discharge: 2016-05-27 | Disposition: A | Discharge status: Home / Self Care | Payer: BLUE CROSS/BLUE SHIELD | Source: Ambulatory Visit | Attending: Radiation Oncology | Admitting: Radiation Oncology

## 2016-05-27 VITALS — BP 118/74 | HR 76 | Temp 98.8°F | Ht 68.0 in | Wt 202.8 lb

## 2016-05-27 DIAGNOSIS — Z171 Estrogen receptor negative status [ER-]: Secondary | ICD-10-CM | POA: Diagnosis not present

## 2016-05-27 DIAGNOSIS — Z510 Encounter for antineoplastic radiation therapy: Secondary | ICD-10-CM | POA: Diagnosis not present

## 2016-05-27 DIAGNOSIS — C50212 Malignant neoplasm of upper-inner quadrant of left female breast: Secondary | ICD-10-CM | POA: Diagnosis not present

## 2016-05-27 NOTE — Progress Notes (Signed)
Department of Radiation Oncology  Phone:  [phone] Fax:        [phone]  Weekly Treatment Note    Name: [name] [name] Date: 05/27/2016 MRN: [id number] DOB: 10-Dec-1960   Diagnosis:     ICD-9-CM ICD-10-CM   1. Breast cancer of upper-inner quadrant of left female breast ([facility]) 174.2 C50.212      Current dose: 34.2 Gy  Current fraction:19   MEDICATIONS: Current Outpatient Prescriptions  Medication Sig Dispense Refill  . aspirin EC 81 MG tablet Take 162 mg by mouth daily.     . carvedilol (COREG) 12.5 MG tablet Take 1 tablet (12.5 mg total) by mouth 2 (two) times daily with a meal. 60 tablet 9  . doxycycline (VIBRAMYCIN) 100 MG capsule Take 100 mg by mouth 2 (two) times daily.    . hyaluronate sodium (RADIAPLEXRX) GEL Apply 1 application topically 2 (two) times daily.    [name] levothyroxine (SYNTHROID, LEVOTHROID) 88 MCG tablet Take 88 mcg by mouth daily.     . non-metallic deodorant Jethro Poling) MISC Apply 1 application topically daily as needed.    [name] omeprazole (PRILOSEC) 20 MG capsule Take 20 mg by mouth daily as needed (for heartburn or acid reflux).     . sacubitril-valsartan (ENTRESTO) 24-26 MG Take 1 tablet by mouth 2 (two) times daily. 180 tablet 3  . HYDROcodone-acetaminophen (NORCO/VICODIN) 5-325 MG tablet Take 1-2 tablets by mouth every 4 (four) hours as needed. (Patient not taking: Reported on 04/25/2016) 40 tablet 0   No current facility-administered medications for this encounter.      ALLERGIES: Avelox [moxifloxacin hcl in nacl]; Clindamycin/lincomycin; Kiwi extract; Bactrim [sulfamethoxazole-trimethoprim]; Levofloxacin; Spironolactone; and Sulfa antibiotics   LABORATORY DATA:  Lab Results  Component Value Date   WBC 7.9 04/12/2016   HGB 11.2 (L) 04/12/2016   HCT 35.0 (L) 04/12/2016   MCV 91.6 04/12/2016   PLT 192 04/12/2016   Lab Results  Component Value Date   NA 138 04/12/2016   K 4.4 04/12/2016   CL 106 04/12/2016   CO2 26 04/12/2016   No results  found for: ALT, AST, GGT, ALKPHOS, BILITOT   NARRATIVE: [name] [name] was seen today for weekly treatment management. The chart was checked and the patient's films were reviewed.  [name] [name] is here for her 19th fraction of radiation to her Left Breast. She reports some mild fatigue over the past few days. She is resting at home, and taking an occasional nap. She reports tenderness to her Left Breast, and occasional sharp pain which come and go quickly. Her Left Breast is hyperpigmented, and she is using the Radiaplex cream twice daily as directed. She recently started an antibiotic for a "boil" to her vaginal area. She reports having 3-4 headaches this week, which don't last long, and go away on their own without intervention.   BP 118/74   Pulse 76   Temp 98.8 F (37.1 C)   Ht 5\' 8"  (1.727 m)   Wt 202 lb 12.8 oz (92 kg)   BMI 30.84 kg/m    Wt Readings from Last 3 Encounters:  05/27/16 202 lb 12.8 oz (92 kg)  05/13/16 204 lb (92.5 kg)  04/25/16 187 lb 9.6 oz (85.1 kg)    PHYSICAL EXAMINATION: height is 5\' 8"  (1.727 m) and weight is 202 lb 12.8 oz (92 kg). Her temperature is 98.8 F (37.1 C). Her blood pressure is 118/74 and her pulse is 76.      Mild erythema/hyperpigmentation present. No desquamation.  ASSESSMENT: The patient is doing satisfactorily with treatment.  PLAN: We will continue with the patient's radiation treatment as planned.

## 2016-05-27 NOTE — Progress Notes (Signed)
[name] [name] is here for her 19th fraction of radiation to her Left Breast. She reports some mild fatigue over the past few days. She is resting at home, and taking an occasional nap. She reports tenderness to her Left Breast, and occasional sharp pain which come and go quickly. Her Left Breast is hyperpigmented, and she is using the Radiaplex cream twice daily as directed. She recently started an antibiotic for a "boil" to her vaginal area. She reports having 3-4 headaches this week, which don't last long, and go away on their own without intervention.   BP 118/74   Pulse 76   Temp 98.8 F (37.1 C)   Ht 5\' 8"  (1.727 m)   Wt 202 lb 12.8 oz (92 kg)   BMI 30.84 kg/m    Wt Readings from Last 3 Encounters:  05/27/16 202 lb 12.8 oz (92 kg)  05/13/16 204 lb (92.5 kg)  04/25/16 187 lb 9.6 oz (85.1 kg)

## 2016-05-30 ENCOUNTER — Ambulatory Visit
Admission: RE | Admit: 2016-05-30 | Discharge: 2016-05-30 | Disposition: A | Discharge status: Home / Self Care | Payer: BLUE CROSS/BLUE SHIELD | Source: Ambulatory Visit | Attending: Radiation Oncology | Admitting: Radiation Oncology

## 2016-05-30 DIAGNOSIS — M531 Cervicobrachial syndrome: Secondary | ICD-10-CM | POA: Diagnosis not present

## 2016-05-30 DIAGNOSIS — M9901 Segmental and somatic dysfunction of cervical region: Secondary | ICD-10-CM | POA: Diagnosis not present

## 2016-05-30 DIAGNOSIS — C50212 Malignant neoplasm of upper-inner quadrant of left female breast: Secondary | ICD-10-CM | POA: Diagnosis not present

## 2016-05-30 DIAGNOSIS — Z171 Estrogen receptor negative status [ER-]: Secondary | ICD-10-CM | POA: Diagnosis not present

## 2016-05-30 DIAGNOSIS — Z510 Encounter for antineoplastic radiation therapy: Secondary | ICD-10-CM | POA: Diagnosis not present

## 2016-05-31 ENCOUNTER — Ambulatory Visit
Admission: RE | Admit: 2016-05-31 | Discharge: 2016-05-31 | Disposition: A | Discharge status: Home / Self Care | Payer: BLUE CROSS/BLUE SHIELD | Source: Ambulatory Visit | Attending: Radiation Oncology | Admitting: Radiation Oncology

## 2016-05-31 DIAGNOSIS — Z510 Encounter for antineoplastic radiation therapy: Secondary | ICD-10-CM | POA: Diagnosis not present

## 2016-05-31 DIAGNOSIS — C50212 Malignant neoplasm of upper-inner quadrant of left female breast: Secondary | ICD-10-CM | POA: Diagnosis not present

## 2016-05-31 DIAGNOSIS — Z171 Estrogen receptor negative status [ER-]: Secondary | ICD-10-CM | POA: Diagnosis not present

## 2016-06-01 ENCOUNTER — Ambulatory Visit
Admission: RE | Admit: 2016-06-01 | Discharge: 2016-06-01 | Disposition: A | Discharge status: Home / Self Care | Payer: BLUE CROSS/BLUE SHIELD | Source: Ambulatory Visit | Attending: Radiation Oncology | Admitting: Radiation Oncology

## 2016-06-01 DIAGNOSIS — C50212 Malignant neoplasm of upper-inner quadrant of left female breast: Secondary | ICD-10-CM | POA: Diagnosis not present

## 2016-06-01 DIAGNOSIS — Z510 Encounter for antineoplastic radiation therapy: Secondary | ICD-10-CM | POA: Diagnosis not present

## 2016-06-01 DIAGNOSIS — Z171 Estrogen receptor negative status [ER-]: Secondary | ICD-10-CM | POA: Diagnosis not present

## 2016-06-02 ENCOUNTER — Ambulatory Visit
Admission: RE | Admit: 2016-06-02 | Discharge: 2016-06-02 | Disposition: A | Discharge status: Home / Self Care | Payer: BLUE CROSS/BLUE SHIELD | Source: Ambulatory Visit | Attending: Radiation Oncology | Admitting: Radiation Oncology

## 2016-06-02 ENCOUNTER — Encounter: Payer: Self-pay | Admitting: Radiation Oncology

## 2016-06-02 DIAGNOSIS — Z510 Encounter for antineoplastic radiation therapy: Secondary | ICD-10-CM | POA: Diagnosis not present

## 2016-06-02 DIAGNOSIS — M531 Cervicobrachial syndrome: Secondary | ICD-10-CM | POA: Diagnosis not present

## 2016-06-02 DIAGNOSIS — M9901 Segmental and somatic dysfunction of cervical region: Secondary | ICD-10-CM | POA: Diagnosis not present

## 2016-06-02 DIAGNOSIS — Z171 Estrogen receptor negative status [ER-]: Secondary | ICD-10-CM | POA: Diagnosis not present

## 2016-06-02 DIAGNOSIS — C50212 Malignant neoplasm of upper-inner quadrant of left female breast: Secondary | ICD-10-CM | POA: Diagnosis not present

## 2016-06-03 ENCOUNTER — Ambulatory Visit: Payer: BLUE CROSS/BLUE SHIELD | Admitting: Radiation Oncology

## 2016-06-03 ENCOUNTER — Ambulatory Visit
Admission: RE | Admit: 2016-06-03 | Discharge: 2016-06-03 | Disposition: A | Discharge status: Home / Self Care | Payer: BLUE CROSS/BLUE SHIELD | Source: Ambulatory Visit | Attending: Radiation Oncology | Admitting: Radiation Oncology

## 2016-06-03 VITALS — BP 121/71 | HR 59 | Temp 98.3°F | Resp 10 | Wt 208.2 lb

## 2016-06-03 DIAGNOSIS — C50212 Malignant neoplasm of upper-inner quadrant of left female breast: Secondary | ICD-10-CM | POA: Diagnosis not present

## 2016-06-03 DIAGNOSIS — Z510 Encounter for antineoplastic radiation therapy: Secondary | ICD-10-CM | POA: Diagnosis not present

## 2016-06-03 DIAGNOSIS — Z171 Estrogen receptor negative status [ER-]: Secondary | ICD-10-CM | POA: Diagnosis not present

## 2016-06-03 NOTE — Progress Notes (Signed)
Department of Radiation Oncology  Phone:  [phone] Fax:        [phone]  Weekly Treatment Note    Name: [name] [name] Date: 06/05/2016 MRN: [id number] DOB: 1961-07-15   Diagnosis:     ICD-9-CM ICD-10-CM   1. Breast cancer of upper-inner quadrant of left female breast ([facility]) 174.2 C50.212      Current dose: 43.2 Gy  Current fraction: 24   MEDICATIONS: Current Outpatient Prescriptions  Medication Sig Dispense Refill  . aspirin EC 81 MG tablet Take 162 mg by mouth daily.     . carvedilol (COREG) 12.5 MG tablet Take 1 tablet (12.5 mg total) by mouth 2 (two) times daily with a meal. 60 tablet 9  . doxycycline (VIBRAMYCIN) 100 MG capsule Take 100 mg by mouth 2 (two) times daily.    . hyaluronate sodium (RADIAPLEXRX) GEL Apply 1 application topically 2 (two) times daily.    [name] HYDROcodone-acetaminophen (NORCO/VICODIN) 5-325 MG tablet Take 1-2 tablets by mouth every 4 (four) hours as needed. (Patient not taking: Reported on 04/25/2016) 40 tablet 0  . levothyroxine (SYNTHROID, LEVOTHROID) 88 MCG tablet Take 88 mcg by mouth daily.     . non-metallic deodorant Jethro Poling) MISC Apply 1 application topically daily as needed.    [name] omeprazole (PRILOSEC) 20 MG capsule Take 20 mg by mouth daily as needed (for heartburn or acid reflux).     . sacubitril-valsartan (ENTRESTO) 24-26 MG Take 1 tablet by mouth 2 (two) times daily. 180 tablet 3   No current facility-administered medications for this encounter.      ALLERGIES: Avelox [moxifloxacin hcl in nacl]; Clindamycin/lincomycin; Kiwi extract; Bactrim [sulfamethoxazole-trimethoprim]; Levofloxacin; Spironolactone; and Sulfa antibiotics   LABORATORY DATA:  Lab Results  Component Value Date   WBC 7.9 04/12/2016   HGB 11.2 (L) 04/12/2016   HCT 35.0 (L) 04/12/2016   MCV 91.6 04/12/2016   PLT 192 04/12/2016   Lab Results  Component Value Date   NA 138 04/12/2016   K 4.4 04/12/2016   CL 106 04/12/2016   CO2 26 04/12/2016   No results  found for: ALT, AST, GGT, ALKPHOS, BILITOT   NARRATIVE: [name] [name] was seen today for weekly treatment management. The chart was checked and the patient's films were reviewed.  She rates her pain as a 2 on a scale of 0-10; constant, dull, and pulling/tightness over the left side of posterior neck. Reports hyperpigmentation, pruritus, erythema, and breast tenderness. Denies edema. Using Radiaplex as directed. Reports fatigue.   BP 121/71   Pulse (!) 59   Temp 98.3 F (36.8 C) (Oral)   Resp 10   Wt 208 lb 3.2 oz (94.4 kg)   SpO2 100%   BMI 31.66 kg/m    Wt Readings from Last 3 Encounters:  06/03/16 208 lb 3.2 oz (94.4 kg)  05/27/16 202 lb 12.8 oz (92 kg)  05/13/16 204 lb (92.5 kg)    PHYSICAL EXAMINATION: weight is 208 lb 3.2 oz (94.4 kg). Her oral temperature is 98.3 F (36.8 C). Her blood pressure is 121/71 and her pulse is 59 (abnormal). Her respiration is 10 and oxygen saturation is 100%.     Mild hyperpigmentation in the treatment area.  ASSESSMENT: The patient is doing satisfactorily with treatment.  PLAN: We will continue with the patient's radiation treatment as planned.     This document serves as a record of services personally performed by Kyung Rudd, MD. It was created on his behalf by Arlyce Harman, a trained medical  scribe. The creation of this record is based on the scribe's personal observations and the provider's statements to them. This document has been checked and approved by the attending provider.  ------------------------------------------------  Jodelle Gross, MD, PhD

## 2016-06-03 NOTE — Progress Notes (Signed)
PAIN: She rates her pain as a 2 on a scale of 0-10. constant, dull and "pulling/tightness." over left side of posterior neck. SKIN: Pt left breast- positive for Hyperpigmentation, Pruritus, erythema and breast tenderness.  Pt denies edema.  Pt continues to apply Radiaplex as directed. OTHER: Pt complains of fatigue. Requesting information regarding bras from [employer] BP 121/71   Pulse (!) 59   Temp 98.3 F (36.8 C) (Oral)   Resp 10   Wt 208 lb 3.2 oz (94.4 kg)   SpO2 100%   BMI 31.66 kg/m  Wt Readings from Last 3 Encounters:  06/03/16 208 lb 3.2 oz (94.4 kg)  05/27/16 202 lb 12.8 oz (92 kg)  05/13/16 204 lb (92.5 kg)

## 2016-06-06 ENCOUNTER — Ambulatory Visit
Admission: RE | Admit: 2016-06-06 | Discharge: 2016-06-06 | Disposition: A | Discharge status: Home / Self Care | Payer: BLUE CROSS/BLUE SHIELD | Source: Ambulatory Visit | Attending: Radiation Oncology | Admitting: Radiation Oncology

## 2016-06-06 ENCOUNTER — Other Ambulatory Visit (HOSPITAL_COMMUNITY): Payer: BLUE CROSS/BLUE SHIELD

## 2016-06-06 DIAGNOSIS — C50212 Malignant neoplasm of upper-inner quadrant of left female breast: Secondary | ICD-10-CM | POA: Diagnosis not present

## 2016-06-06 DIAGNOSIS — Z171 Estrogen receptor negative status [ER-]: Secondary | ICD-10-CM | POA: Diagnosis not present

## 2016-06-06 DIAGNOSIS — Z510 Encounter for antineoplastic radiation therapy: Secondary | ICD-10-CM | POA: Diagnosis not present

## 2016-06-07 ENCOUNTER — Ambulatory Visit
Admission: RE | Admit: 2016-06-07 | Discharge: 2016-06-07 | Disposition: A | Discharge status: Home / Self Care | Payer: BLUE CROSS/BLUE SHIELD | Source: Ambulatory Visit | Attending: Radiation Oncology | Admitting: Radiation Oncology

## 2016-06-07 DIAGNOSIS — C50212 Malignant neoplasm of upper-inner quadrant of left female breast: Secondary | ICD-10-CM | POA: Insufficient documentation

## 2016-06-07 DIAGNOSIS — Z510 Encounter for antineoplastic radiation therapy: Secondary | ICD-10-CM | POA: Diagnosis not present

## 2016-06-08 ENCOUNTER — Ambulatory Visit
Admission: RE | Admit: 2016-06-08 | Discharge: 2016-06-08 | Disposition: A | Discharge status: Home / Self Care | Payer: BLUE CROSS/BLUE SHIELD | Source: Ambulatory Visit | Attending: Radiation Oncology | Admitting: Radiation Oncology

## 2016-06-08 DIAGNOSIS — M9901 Segmental and somatic dysfunction of cervical region: Secondary | ICD-10-CM | POA: Diagnosis not present

## 2016-06-08 DIAGNOSIS — C50212 Malignant neoplasm of upper-inner quadrant of left female breast: Secondary | ICD-10-CM | POA: Diagnosis not present

## 2016-06-08 DIAGNOSIS — Z510 Encounter for antineoplastic radiation therapy: Secondary | ICD-10-CM | POA: Diagnosis not present

## 2016-06-08 DIAGNOSIS — M531 Cervicobrachial syndrome: Secondary | ICD-10-CM | POA: Diagnosis not present

## 2016-06-09 ENCOUNTER — Encounter: Payer: Self-pay | Admitting: Cardiology

## 2016-06-09 ENCOUNTER — Ambulatory Visit
Admission: RE | Admit: 2016-06-09 | Discharge: 2016-06-09 | Disposition: A | Discharge status: Home / Self Care | Payer: BLUE CROSS/BLUE SHIELD | Source: Ambulatory Visit | Attending: Radiation Oncology | Admitting: Radiation Oncology

## 2016-06-09 DIAGNOSIS — Z510 Encounter for antineoplastic radiation therapy: Secondary | ICD-10-CM | POA: Diagnosis not present

## 2016-06-09 DIAGNOSIS — C50212 Malignant neoplasm of upper-inner quadrant of left female breast: Secondary | ICD-10-CM | POA: Diagnosis not present

## 2016-06-10 ENCOUNTER — Ambulatory Visit
Admission: RE | Admit: 2016-06-10 | Discharge: 2016-06-10 | Disposition: A | Discharge status: Home / Self Care | Payer: BLUE CROSS/BLUE SHIELD | Source: Ambulatory Visit | Attending: Radiation Oncology | Admitting: Radiation Oncology

## 2016-06-10 ENCOUNTER — Encounter: Payer: Self-pay | Admitting: Radiation Oncology

## 2016-06-10 VITALS — BP 120/72 | HR 71 | Temp 98.2°F | Ht 68.0 in | Wt 205.0 lb

## 2016-06-10 DIAGNOSIS — Z510 Encounter for antineoplastic radiation therapy: Secondary | ICD-10-CM | POA: Diagnosis not present

## 2016-06-10 DIAGNOSIS — C50212 Malignant neoplasm of upper-inner quadrant of left female breast: Secondary | ICD-10-CM

## 2016-06-10 MED ORDER — RADIAPLEXRX EX GEL
Freq: Once | CUTANEOUS | Status: AC
  Administered 2016-06-10: 13:00:00 via TOPICAL

## 2016-06-10 NOTE — Progress Notes (Signed)
Complex simulation note  Diagnosis: Left-sided breast cancer  Narrative The patient has initially been planned to receive a course of whole breast radiation to a dose of 50.4 Gy in 28 fractions. The patient will now receive an additional boost to the seroma cavity which has been contoured. This will correspond to a boost of 10 Gy at 2 Gy per fraction. To accomplish this, an additional 2 customized blocks have been designed for this purpose. A complex isodose plan is requested to ensure that the target area is adequately covered with radiation dose and that the nearby normal structures such as the lung are adequately spared. The patient's final total dose will be 60.4 Gy.  ------------------------------------------------  Jodelle Gross, MD, PhD

## 2016-06-10 NOTE — Progress Notes (Signed)
[name] [name] has received 29 fractions to her left breast.  Note follicular hyperpigmentation in the left breast and inframmary fold with erythema. Intact skin.  Reports level 9/10 pain in the areola region which is very sensitive at this time. She reports that she has insomnia with mild fatigue.  Rest when needed.   BP 120/72 (BP Location: Right Arm, Patient Position: Sitting, Cuff Size: Normal)   Pulse 71   Temp 98.2 F (36.8 C) (Oral)   Ht 5\' 8"  (1.727 m)   Wt 205 lb (93 kg)   BMI 31.17 kg/m    Wt Readings from Last 3 Encounters:  06/10/16 205 lb (93 kg)  06/03/16 208 lb 3.2 oz (94.4 kg)  05/27/16 202 lb 12.8 oz (92 kg)

## 2016-06-10 NOTE — Progress Notes (Signed)
   Department of Radiation Oncology  Phone:  [phone] Fax:        [phone]  Weekly Treatment Note    Name: [name] [name] Date: 06/10/2016 MRN: [id number] DOB: April 15, 1961   Diagnosis:     ICD-9-CM ICD-10-CM   1. Breast cancer of upper-inner quadrant of left female breast ([facility]) 174.2 C50.212 hyaluronate sodium (RADIAPLEXRX) gel     Current dose: 52.4 Gy  Current fraction: 29   MEDICATIONS: Current Outpatient Prescriptions  Medication Sig Dispense Refill  . aspirin EC 81 MG tablet Take 162 mg by mouth daily.     . carvedilol (COREG) 12.5 MG tablet Take 1 tablet (12.5 mg total) by mouth 2 (two) times daily with a meal. 60 tablet 9  . doxycycline (VIBRAMYCIN) 100 MG capsule Take 100 mg by mouth 2 (two) times daily.    . hyaluronate sodium (RADIAPLEXRX) GEL Apply 1 application topically 2 (two) times daily.    [name] levothyroxine (SYNTHROID, LEVOTHROID) 88 MCG tablet Take 88 mcg by mouth daily.     . non-metallic deodorant Jethro Poling) MISC Apply 1 application topically daily as needed.    [name] omeprazole (PRILOSEC) 20 MG capsule Take 20 mg by mouth daily as needed (for heartburn or acid reflux).     . sacubitril-valsartan (ENTRESTO) 24-26 MG Take 1 tablet by mouth 2 (two) times daily. 180 tablet 3   No current facility-administered medications for this encounter.      ALLERGIES: Avelox [moxifloxacin hcl in nacl]; Clindamycin/lincomycin; Kiwi extract; Bactrim [sulfamethoxazole-trimethoprim]; Levofloxacin; Spironolactone; and Sulfa antibiotics   LABORATORY DATA:  Lab Results  Component Value Date   WBC 7.9 04/12/2016   HGB 11.2 (L) 04/12/2016   HCT 35.0 (L) 04/12/2016   MCV 91.6 04/12/2016   PLT 192 04/12/2016   Lab Results  Component Value Date   NA 138 04/12/2016   K 4.4 04/12/2016   CL 106 04/12/2016   CO2 26 04/12/2016   No results found for: ALT, AST, GGT, ALKPHOS, BILITOT   NARRATIVE: [name] [name] was seen today for weekly treatment management. The chart was  checked and the patient's films were reviewed.  [name] [name] has received 29 fractions to her left breast.  Note follicular hyperpigmentation in the left breast and inframmary fold with erythema. Intact skin.  Reports level 9/10 pain in the areola region which is very sensitive at this time. She reports that she has insomnia with mild fatigue.  Rest when needed.   BP 120/72 (BP Location: Right Arm, Patient Position: Sitting, Cuff Size: Normal)   Pulse 71   Temp 98.2 F (36.8 C) (Oral)   Ht 5\' 8"  (1.727 m)   Wt 205 lb (93 kg)   BMI 31.17 kg/m    Wt Readings from Last 3 Encounters:  06/10/16 205 lb (93 kg)  06/03/16 208 lb 3.2 oz (94.4 kg)  05/27/16 202 lb 12.8 oz (92 kg)      PHYSICAL EXAMINATION: height is 5\' 8"  (1.727 m) and weight is 205 lb (93 kg). Her oral temperature is 98.2 F (36.8 C). Her blood pressure is 120/72 and her pulse is 71.        ASSESSMENT: The patient is doing satisfactorily with treatment.  PLAN: We will continue with the patient's radiation treatment as planned.

## 2016-06-13 ENCOUNTER — Ambulatory Visit
Admission: RE | Admit: 2016-06-13 | Discharge: 2016-06-13 | Disposition: A | Discharge status: Home / Self Care | Payer: BLUE CROSS/BLUE SHIELD | Source: Ambulatory Visit | Attending: Radiation Oncology | Admitting: Radiation Oncology

## 2016-06-13 ENCOUNTER — Ambulatory Visit (INDEPENDENT_AMBULATORY_CARE_PROVIDER_SITE_OTHER): Payer: BLUE CROSS/BLUE SHIELD | Admitting: *Deleted

## 2016-06-13 DIAGNOSIS — Z510 Encounter for antineoplastic radiation therapy: Secondary | ICD-10-CM | POA: Diagnosis not present

## 2016-06-13 DIAGNOSIS — I428 Other cardiomyopathies: Secondary | ICD-10-CM

## 2016-06-13 DIAGNOSIS — I429 Cardiomyopathy, unspecified: Secondary | ICD-10-CM | POA: Diagnosis not present

## 2016-06-13 DIAGNOSIS — C50212 Malignant neoplasm of upper-inner quadrant of left female breast: Secondary | ICD-10-CM | POA: Diagnosis not present

## 2016-06-13 NOTE — Progress Notes (Signed)
Remote ICD transmission.   

## 2016-06-14 ENCOUNTER — Ambulatory Visit
Admission: RE | Admit: 2016-06-14 | Discharge: 2016-06-14 | Disposition: A | Discharge status: Home / Self Care | Payer: BLUE CROSS/BLUE SHIELD | Source: Ambulatory Visit | Attending: Radiation Oncology | Admitting: Radiation Oncology

## 2016-06-14 DIAGNOSIS — Z510 Encounter for antineoplastic radiation therapy: Secondary | ICD-10-CM | POA: Diagnosis not present

## 2016-06-14 DIAGNOSIS — C50212 Malignant neoplasm of upper-inner quadrant of left female breast: Secondary | ICD-10-CM | POA: Diagnosis not present

## 2016-06-15 ENCOUNTER — Telehealth: Payer: Self-pay | Admitting: *Deleted

## 2016-06-15 ENCOUNTER — Encounter: Payer: Self-pay | Admitting: Cardiology

## 2016-06-15 ENCOUNTER — Encounter: Payer: Self-pay | Admitting: Radiation Oncology

## 2016-06-15 ENCOUNTER — Ambulatory Visit
Admission: RE | Admit: 2016-06-15 | Discharge: 2016-06-15 | Disposition: A | Discharge status: Home / Self Care | Payer: BLUE CROSS/BLUE SHIELD | Source: Ambulatory Visit | Attending: Radiation Oncology | Admitting: Radiation Oncology

## 2016-06-15 VITALS — BP 133/78 | HR 71 | Temp 99.1°F | Resp 20 | Wt 212.5 lb

## 2016-06-15 DIAGNOSIS — C50212 Malignant neoplasm of upper-inner quadrant of left female breast: Secondary | ICD-10-CM

## 2016-06-15 DIAGNOSIS — M531 Cervicobrachial syndrome: Secondary | ICD-10-CM | POA: Diagnosis not present

## 2016-06-15 DIAGNOSIS — Z510 Encounter for antineoplastic radiation therapy: Secondary | ICD-10-CM | POA: Diagnosis not present

## 2016-06-15 DIAGNOSIS — M9901 Segmental and somatic dysfunction of cervical region: Secondary | ICD-10-CM | POA: Diagnosis not present

## 2016-06-15 NOTE — Progress Notes (Signed)
Weekly rad txs 32/33 left breast completed, erythema, skin intact, using radiaplex bid, has new tube stated, not interested in [facility], lives in [place] works odd hours , gave 1 month fu/u appt with Shona Simpson, PA, occasional sharp pains in left breast, resolves quickly, appetite good,  12:12 PM  BP 133/78 (BP Location: Right Arm, Patient Position: Sitting, Cuff Size: Normal)   Pulse 71   Temp 99.1 F (37.3 C) (Oral)   Resp 20   Wt 212 lb 8 oz (96.4 kg)   BMI 32.31 kg/m   Wt Readings from Last 3 Encounters:  06/15/16 212 lb 8 oz (96.4 kg)  06/10/16 205 lb (93 kg)  06/03/16 208 lb 3.2 oz (94.4 kg)

## 2016-06-15 NOTE — Progress Notes (Signed)
   Department of Radiation Oncology  Phone:  [phone] Fax:        [phone]  Weekly Treatment Note    Name: [name] [name] Date: 06/15/2016 MRN: [id number] DOB: [birth year]   Diagnosis:     ICD-9-CM ICD-10-CM   1. Breast cancer of upper-inner quadrant of left female breast ([facility]) 174.2 C50.212      Current dose: 58.4 Gy  Current fraction: 32   MEDICATIONS: Current Outpatient Prescriptions  Medication Sig Dispense Refill  . aspirin EC 81 MG tablet Take 162 mg by mouth daily.     . carvedilol (COREG) 12.5 MG tablet Take 1 tablet (12.5 mg total) by mouth 2 (two) times daily with a meal. 60 tablet 9  . hyaluronate sodium (RADIAPLEXRX) GEL Apply 1 application topically 2 (two) times daily.    [name] levothyroxine (SYNTHROID, LEVOTHROID) 88 MCG tablet Take 88 mcg by mouth daily.     . non-metallic deodorant Jethro Poling) MISC Apply 1 application topically daily as needed.    [name] omeprazole (PRILOSEC) 20 MG capsule Take 20 mg by mouth daily as needed (for heartburn or acid reflux).     . sacubitril-valsartan (ENTRESTO) 24-26 MG Take 1 tablet by mouth 2 (two) times daily. 180 tablet 3   No current facility-administered medications for this encounter.      ALLERGIES: Avelox [moxifloxacin hcl in nacl]; Clindamycin/lincomycin; Kiwi extract; Bactrim [sulfamethoxazole-trimethoprim]; Levofloxacin; Moxifloxacin; Spironolactone; and Sulfa antibiotics   LABORATORY DATA:  Lab Results  Component Value Date   WBC 7.9 04/12/2016   HGB 11.2 (L) 04/12/2016   HCT 35.0 (L) 04/12/2016   MCV 91.6 04/12/2016   PLT 192 04/12/2016   Lab Results  Component Value Date   NA 138 04/12/2016   K 4.4 04/12/2016   CL 106 04/12/2016   CO2 26 04/12/2016   No results found for: ALT, AST, GGT, ALKPHOS, BILITOT   NARRATIVE: [name] [name] was seen today for weekly treatment management. The chart was checked and the patient's films were reviewed.  Weekly rad txs 32/33 left breast completed, erythema, skin  intact, using radiaplex bid, has new tube stated, not interested in [facility], lives in [place] works odd hours , gave 1 month fu/u appt with Shona Simpson, PA, occasional sharp pains in left breast, resolves quickly, appetite good,  12:24 PM  BP 133/78 (BP Location: Right Arm, Patient Position: Sitting, Cuff Size: Normal)   Pulse 71   Temp 99.1 F (37.3 C) (Oral)   Resp 20   Wt 212 lb 8 oz (96.4 kg)   BMI 32.31 kg/m   Wt Readings from Last 3 Encounters:  06/15/16 212 lb 8 oz (96.4 kg)  06/10/16 205 lb (93 kg)  06/03/16 208 lb 3.2 oz (94.4 kg)    PHYSICAL EXAMINATION: weight is 212 lb 8 oz (96.4 kg). Her oral temperature is 99.1 F (37.3 C). Her blood pressure is 133/78 and her pulse is 71. Her respiration is 20.      Dermatitis present in the treatment area. It appears that this will heal well.  ASSESSMENT: The patient is doing satisfactorily with treatment.  PLAN: We will continue with the patient's radiation treatment as planned. Patient will finish her final treatment and return in one month.

## 2016-06-15 NOTE — Telephone Encounter (Signed)
Called Loney Loh ICD rep to come tomorrow to check patient's pacemaker/ICD for her last treatment, she is scheduled for 11:45am treatment completes at noon, [phone] stated he would be here, thanked [name] Reichmann, will notify the linac #3 therapists, spoke with [facility]  8:15 AM

## 2016-06-16 ENCOUNTER — Ambulatory Visit
Admission: RE | Admit: 2016-06-16 | Payer: BLUE CROSS/BLUE SHIELD | Source: Ambulatory Visit | Admitting: Radiation Oncology

## 2016-06-16 ENCOUNTER — Ambulatory Visit
Admission: RE | Admit: 2016-06-16 | Discharge: 2016-06-16 | Disposition: A | Discharge status: Home / Self Care | Payer: BLUE CROSS/BLUE SHIELD | Source: Ambulatory Visit | Attending: Radiation Oncology | Admitting: Radiation Oncology

## 2016-06-16 DIAGNOSIS — Z510 Encounter for antineoplastic radiation therapy: Secondary | ICD-10-CM | POA: Diagnosis not present

## 2016-06-16 DIAGNOSIS — C50212 Malignant neoplasm of upper-inner quadrant of left female breast: Secondary | ICD-10-CM | POA: Diagnosis not present

## 2016-06-16 NOTE — Progress Notes (Signed)
Joey from [place] checked Ms Kayes's ICD?Pacemake, stated"It was good and he would transfer the information to the clinic where she is being cared for 12:16 PM

## 2016-06-17 ENCOUNTER — Telehealth: Payer: Self-pay | Admitting: *Deleted

## 2016-06-17 DIAGNOSIS — C50212 Malignant neoplasm of upper-inner quadrant of left female breast: Secondary | ICD-10-CM

## 2016-06-17 NOTE — Telephone Encounter (Signed)
  Oncology Nurse Navigator Documentation    Navigator Encounter Type: Telephone (06/17/16 1400) Telephone: Linn Call (06/17/16 1400)         Patient Visit Type: [id number] (06/17/16 1400) Treatment Phase: Final Radiation Tx (06/17/16 1400)     Interventions: Referrals (06/17/16 1400) Referrals: Survivorship (06/17/16 1400)          Acuity: Level 1 (06/17/16 1400)         Time Spent with Patient: 15 (06/17/16 1400)

## 2016-06-19 ENCOUNTER — Telehealth: Payer: Self-pay | Admitting: Hematology and Oncology

## 2016-06-19 NOTE — Telephone Encounter (Signed)
Lvm advising appt 11/20 @ 10.30am. Also mailed appt calendar.

## 2016-06-22 ENCOUNTER — Encounter: Payer: Self-pay | Admitting: Radiation Oncology

## 2016-06-22 ENCOUNTER — Ambulatory Visit: Payer: BLUE CROSS/BLUE SHIELD | Admitting: Cardiology

## 2016-06-22 DIAGNOSIS — C50912 Malignant neoplasm of unspecified site of left female breast: Secondary | ICD-10-CM | POA: Diagnosis not present

## 2016-06-22 DIAGNOSIS — M9901 Segmental and somatic dysfunction of cervical region: Secondary | ICD-10-CM | POA: Diagnosis not present

## 2016-06-22 DIAGNOSIS — M531 Cervicobrachial syndrome: Secondary | ICD-10-CM | POA: Diagnosis not present

## 2016-06-22 NOTE — Progress Notes (Signed)
°  Radiation Oncology         [phone] ________________________________  Name: [name] [name] MRN: [id number]  Date: 06/22/2016  DOB: 1961-01-28  End of Treatment Note  Diagnosis:   Stage 2a T2 N0 invasive ductal carcinoma of left female breast     Indication for treatment:  Curative       Radiation treatment dates:  05/03/2016-06/16/2016  Site/dose:  1. Left breast boost // 10 Gy with 5 fractions at a dose of 2 Gy/fraction 2. Left breast// 50.4 Gy with 28 fractions at a dose of 1.8 Gy/ fraction  Beams/energy: 1. Isodose plan // 6X 2. 3D // 6X  Narrative: The patient tolerated radiation treatment relatively well.   Plan: The patient has completed radiation treatment. The patient will return to radiation oncology clinic for routine followup in one month. I advised them to call or return sooner if they have any questions or concerns related to their recovery or treatment.  ------------------------------------------------  Jodelle Gross, MD, PhD  This document serves as a record of services personally performed by Kyung Rudd, MD. It was created on his behalf by Bethann Humble, a trained medical scribe. The creation of this record is based on the scribe's personal observations and the provider's statements to them. This document has been checked and approved by the attending provider.

## 2016-06-23 LAB — CUP PACEART REMOTE DEVICE CHECK
Battery Remaining Longevity: 126 mo
Battery Remaining Percentage: 100 %
Date Time Interrogation Session: 20170821132200
HighPow Impedance: 46 Ohm
Implantable Lead Implant Date: 20090320
Implantable Lead Location: 753860
Implantable Lead Model: 158
Implantable Lead Model: 5076
Implantable Lead Serial Number: 182504
Lead Channel Pacing Threshold Pulse Width: 0.4 ms
Lead Channel Setting Pacing Amplitude: 2.5 V
Lead Channel Setting Pacing Pulse Width: 0.4 ms
Lead Channel Setting Sensing Sensitivity: 0.6 mV
MDC IDC LEAD IMPLANT DT: 20090320
MDC IDC LEAD LOCATION: 753859
MDC IDC MSMT LEADCHNL RA IMPEDANCE VALUE: 558 Ohm
MDC IDC MSMT LEADCHNL RA PACING THRESHOLD AMPLITUDE: 0.4 V
MDC IDC MSMT LEADCHNL RA PACING THRESHOLD PULSEWIDTH: 0.4 ms
MDC IDC MSMT LEADCHNL RV IMPEDANCE VALUE: 409 Ohm
MDC IDC MSMT LEADCHNL RV PACING THRESHOLD AMPLITUDE: 0.9 V
MDC IDC PG SERIAL: 111826
MDC IDC SET LEADCHNL RA PACING AMPLITUDE: 2 V
MDC IDC STAT BRADY RA PERCENT PACED: 1 %
MDC IDC STAT BRADY RV PERCENT PACED: 0 %

## 2016-06-24 IMAGING — CR DG CHEST 2V
2 series · 2 of 2 positions shown · non-contrast
Comparison: 01/12/2008

CLINICAL DATA: The pt is c/o generalized chest pain since
yesterday. No cough no cold . She just feels tired. Her chest pain
Is better when she lies down. Pt has hx of cardiomyopathy, GERD,
bradycardia, TIA, ICD. Non-Smoker.

EXAM:
CHEST  2 VIEW

[w chest pa]
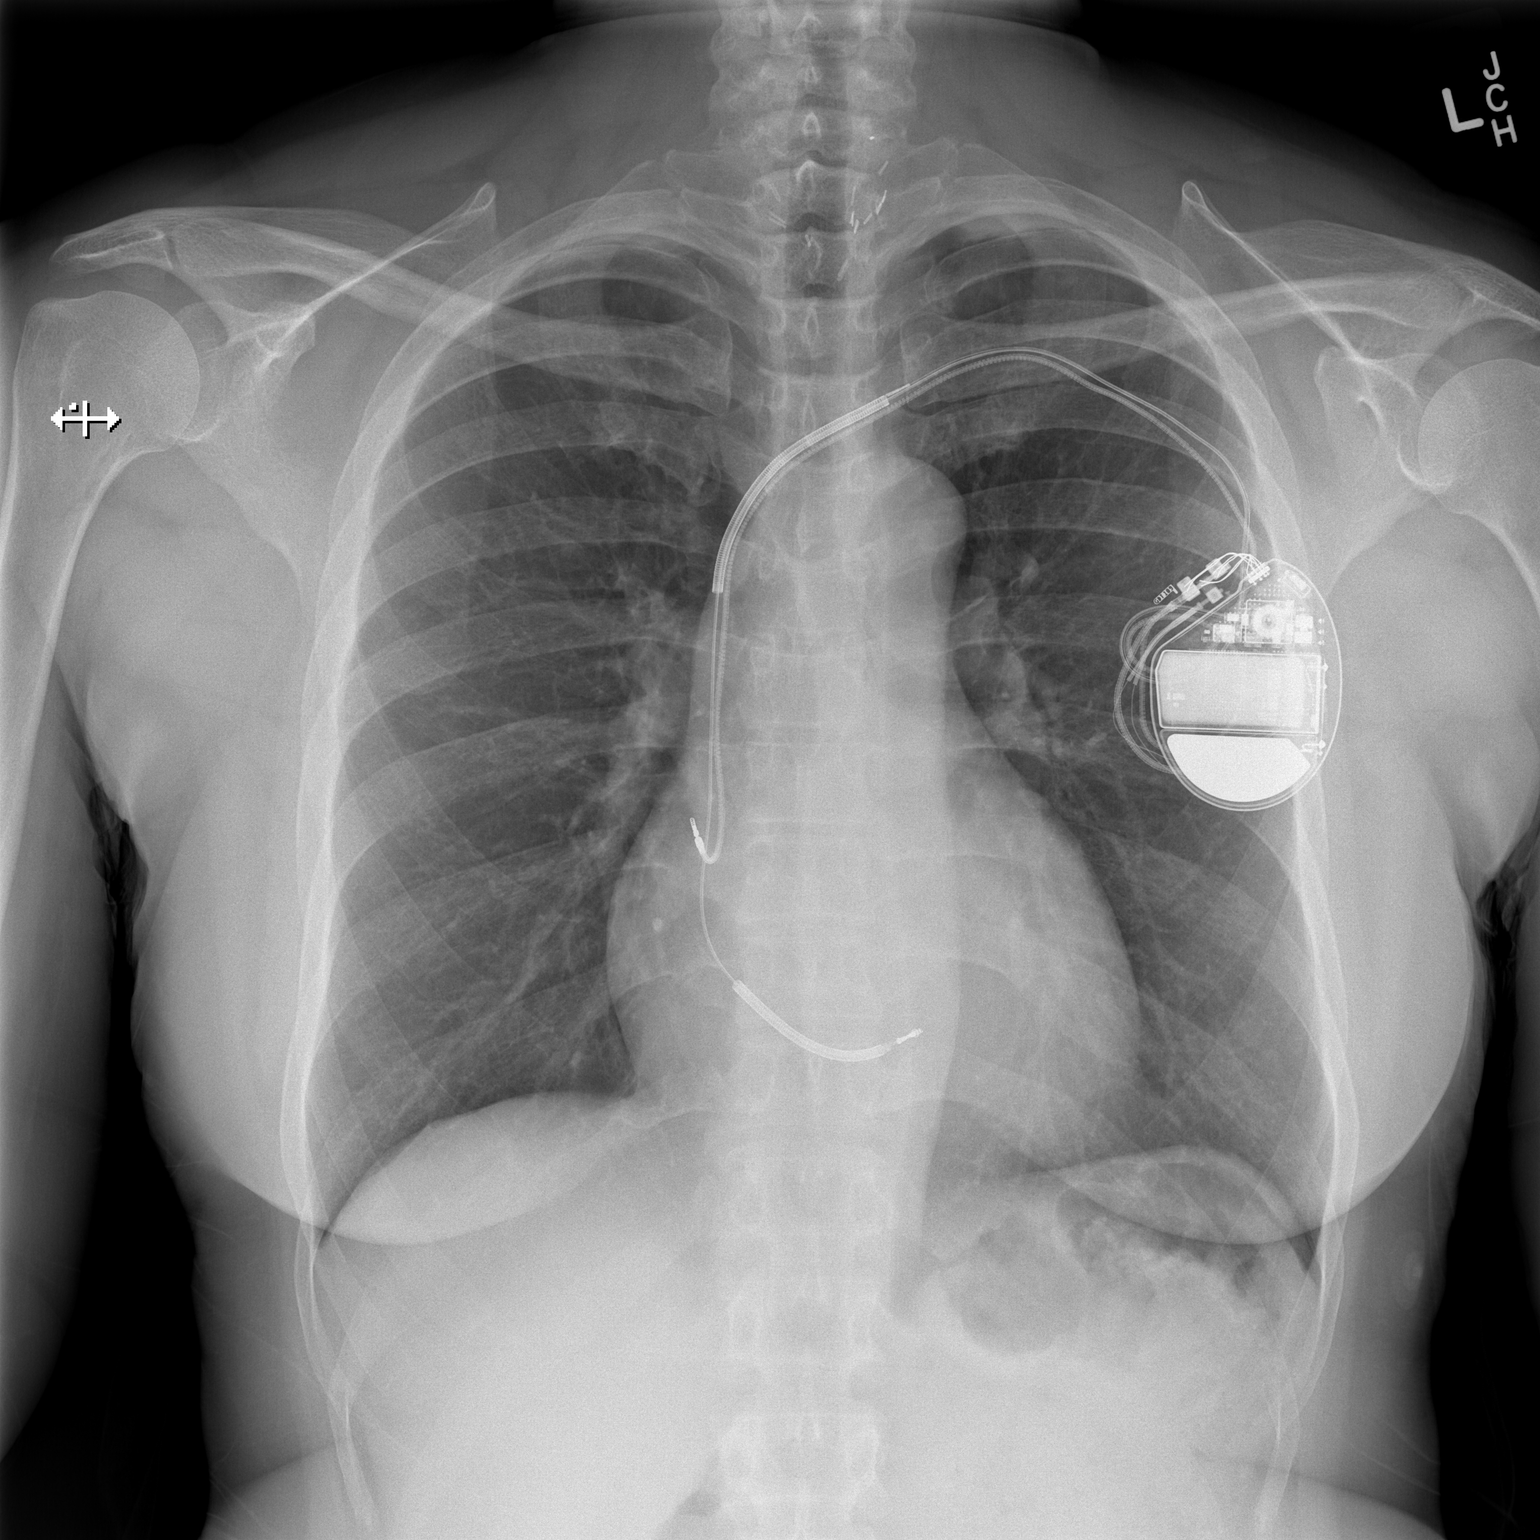

[w chest lat]
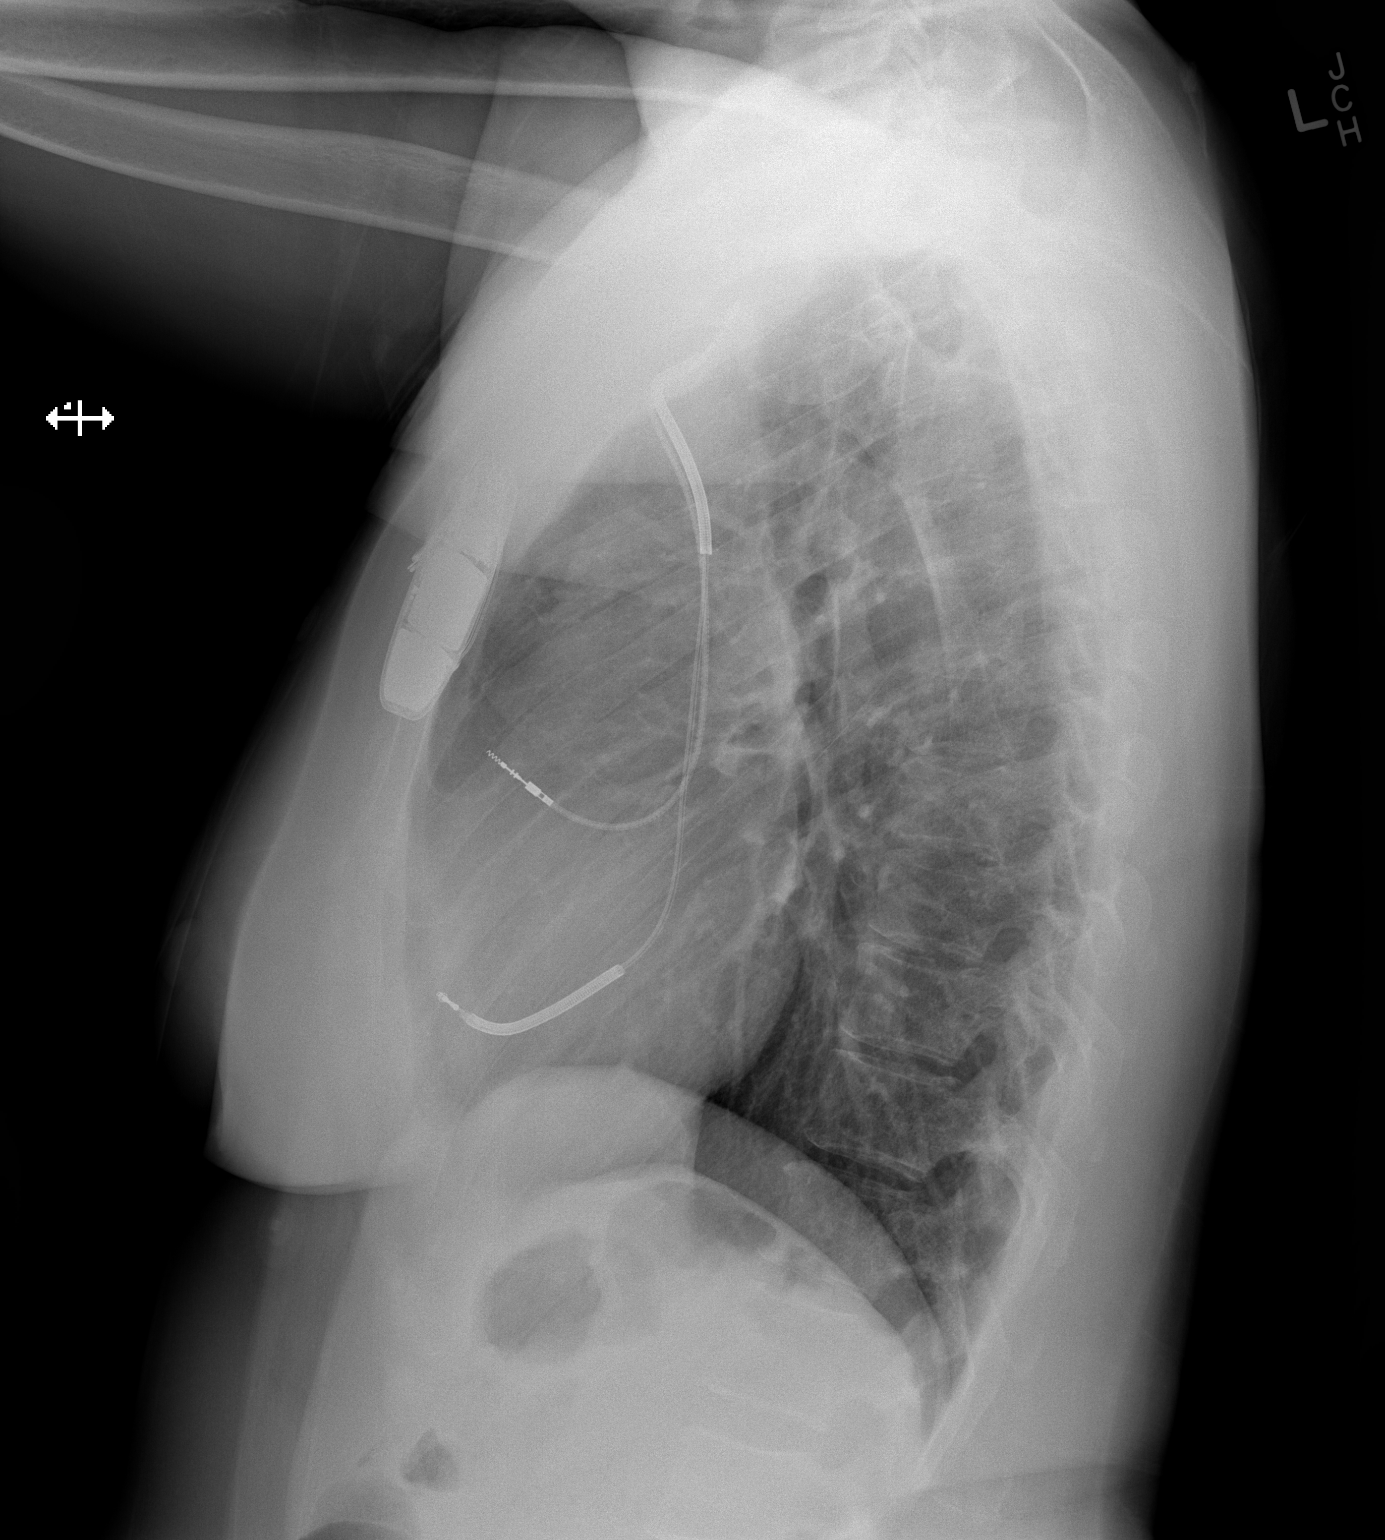

[2 of 2 positions shown; findings below may reference images not displayed]

FINDINGS: Cardiac silhouette mildly enlarged. Normal mediastinal and hilar
contours. Left anterior chest wall AICD is stable and well
positioned.

Clear lungs.  No pleural effusion or pneumothorax.

Surgical vascular clips at the neck base are stable consistent with
previous thyroid surgery.

Bony thorax is intact.
IMPRESSION: No acute cardiopulmonary disease.

## 2016-06-29 ENCOUNTER — Telehealth: Payer: Self-pay | Admitting: Hematology and Oncology

## 2016-06-29 ENCOUNTER — Encounter: Payer: Self-pay | Admitting: Hematology and Oncology

## 2016-06-29 ENCOUNTER — Ambulatory Visit (HOSPITAL_BASED_OUTPATIENT_CLINIC_OR_DEPARTMENT_OTHER): Payer: BLUE CROSS/BLUE SHIELD | Admitting: Hematology and Oncology

## 2016-06-29 DIAGNOSIS — C50212 Malignant neoplasm of upper-inner quadrant of left female breast: Secondary | ICD-10-CM | POA: Diagnosis not present

## 2016-06-29 NOTE — Progress Notes (Signed)
Patient Care Team: Terald Sleeper, PA as PCP - General (General Practice) Nicholas Lose, MD as Consulting Physician (Hematology and Oncology) Kyung Rudd, MD as Consulting Physician (Radiation Oncology) [name], MD as Consulting Physician (General Surgery)   SUMMARY OF ONCOLOGIC HISTORY:   Breast cancer of upper-inner quadrant of left female breast ([place])   02/24/2016 Mammogram    Left breast mass 11:00 position 4 cm from nipple, 2.4 x 1.6 x 1.9 cm, single axillary lymph node noted, T2 N0 stage IIA clinical stage ([place])      02/24/2016 Initial Diagnosis    Moderate to poorly differentiated IDC, lymph node biopsy negative, ER 0%, PR 0%, HER-2, 2+ by IHC, fish negative, ratio 1.2, Ki-67 59%       04/05/2016 Surgery    Left lumpectomy: IDC grade 3, 2.3 cm, associated high-grade DCIS, margins negative, 0/1 lymph node, T2 N0 stage II a, ER 0%, PR 0%, HER-2 negative ratio 1.38       Chemotherapy    Patient refused adjuvant chemotherapy      05/03/2016 - 06/16/2016 Radiation Therapy    Left breast boost // 10 Gy with 5 fractions at a dose of 2 Gy/fraction 2. Left breast// 50.4 Gy with 28 fractions at a dose of 1.8 Gy/ fraction       CHIEF COMPLIANT: follow-up after recent radiation therapy  INTERVAL HISTORY: [name] [name] is a 55-year-old with above-mentioned history of left breast cancer and underwent lumpectomy and refused adjuvant chemotherapy.she completed adjuvant radiation therapy and is here today to discuss surveillance. She reports that her body aches and pains have improved with the help of chiropractor.   REVIEW OF SYSTEMS:   Constitutional: Denies fevers, chills or abnormal weight loss Eyes: Denies blurriness of vision Ears, nose, mouth, throat, and face: Denies mucositis or sore throat Respiratory: Denies cough, dyspnea or wheezes Cardiovascular: Denies palpitation, chest discomfort Gastrointestinal:  Denies nausea, heartburn or change in bowel habits Skin: Denies  abnormal skin rashes Lymphatics: Denies new lymphadenopathy or easy bruising Neurological:Denies numbness, tingling or new weaknesses Behavioral/Psych: Mood is stable, no new changes  Extremities: No lower extremity edema Breast:  denies any pain or lumps or nodules in either breasts All other systems were reviewed with the patient and are negative.  I have reviewed the past medical history, past surgical history, social history and family history with the patient and they are unchanged from previous note.  ALLERGIES:  is allergic to avelox [moxifloxacin hcl in nacl]; clindamycin/lincomycin; kiwi extract; bactrim [sulfamethoxazole-trimethoprim]; levofloxacin; moxifloxacin; spironolactone; and sulfa antibiotics.  MEDICATIONS:  Current Outpatient Prescriptions  Medication Sig Dispense Refill  . aspirin EC 81 MG tablet Take 162 mg by mouth daily.     . carvedilol (COREG) 12.5 MG tablet Take 1 tablet (12.5 mg total) by mouth 2 (two) times daily with a meal. 60 tablet 9  . hyaluronate sodium (RADIAPLEXRX) GEL Apply 1 application topically 2 (two) times daily.    [name] levothyroxine (SYNTHROID, LEVOTHROID) 88 MCG tablet Take 88 mcg by mouth daily.     . non-metallic deodorant Jethro Poling) MISC Apply 1 application topically daily as needed.    [name] omeprazole (PRILOSEC) 20 MG capsule Take 20 mg by mouth daily as needed (for heartburn or acid reflux).     . sacubitril-valsartan (ENTRESTO) 24-26 MG Take 1 tablet by mouth 2 (two) times daily. 180 tablet 3   No current facility-administered medications for this visit.     PHYSICAL EXAMINATION: ECOG PERFORMANCE STATUS: 1 - Symptomatic  but completely ambulatory  Vitals:   06/29/16 1035  BP: 134/74  Pulse: 63  Resp: 18  Temp: 98.3 F (36.8 C)   Filed Weights   06/29/16 1035  Weight: 213 lb (96.6 kg)    GENERAL:alert, no distress and comfortable SKIN: skin color, texture, turgor are normal, no rashes or significant lesions EYES: normal, Conjunctiva  are pink and non-injected, sclera clear OROPHARYNX:no exudate, no erythema and lips, buccal mucosa, and tongue normal  NECK: supple, thyroid normal size, non-tender, without nodularity LYMPH:  no palpable lymphadenopathy in the cervical, axillary or inguinal LUNGS: clear to auscultation and percussion with normal breathing effort HEART: regular rate & rhythm and no murmurs and no lower extremity edema ABDOMEN:abdomen soft, non-tender and normal bowel sounds MUSCULOSKELETAL:no cyanosis of digits and no clubbing  NEURO: alert & oriented x 3 with fluent speech, no focal motor/sensory deficits EXTREMITIES: No lower extremity edema BREAST: No palpable masses or nodules in either right or left breasts. No palpable axillary supraclavicular or infraclavicular adenopathy no breast tenderness or nipple discharge. (exam performed in the presence of a chaperone)  LABORATORY DATA:  I have reviewed the data as listed   Chemistry      Component Value Date/Time   NA 138 04/12/2016 2218   K 4.4 04/12/2016 2218   CL 106 04/12/2016 2218   CO2 26 04/12/2016 2218   BUN 15 04/12/2016 2218   CREATININE 0.90 04/12/2016 2218      Component Value Date/Time   CALCIUM 9.5 04/12/2016 2218       Lab Results  Component Value Date   WBC 7.9 04/12/2016   HGB 11.2 (L) 04/12/2016   HCT 35.0 (L) 04/12/2016   MCV 91.6 04/12/2016   PLT 192 04/12/2016   NEUTROABS 4.9 04/12/2016     ASSESSMENT & PLAN:  Breast cancer of upper-inner quadrant of left female breast ([place]) Left lumpectomy 04/05/2016: IDC grade 3, 2.3 cm, associated high-grade DCIS, margins negative, 0/1 lymph node, T2 N0 stage II a, ER 0%, PR 0%, HER-2 negative ratio 1.38 Patient refused adjuvant chemotherapy (patient fully understands that she is at very high risk of recurrence) Received adjuvant radiation from 05/03/2016 to 06/16/2016  Surveillance of breast cancer: 1. Breast exams periodically 2. Annual mammograms  Return to clinic in one  year for surveillance checkups and follow-up  No orders of the defined types were placed in this encounter.  The patient has a good understanding of the overall plan. she agrees with it. she will call with any problems that may develop before the next visit here.   Rulon Eisenmenger, MD 06/29/16

## 2016-06-29 NOTE — Assessment & Plan Note (Signed)
Left lumpectomy 04/05/2016: IDC grade 3, 2.3 cm, associated high-grade DCIS, margins negative, 0/1 lymph node, T2 N0 stage II a, ER 0%, PR 0%, HER-2 negative ratio 1.38 Patient refused adjuvant chemotherapy (patient fully understands that she is at very high risk of recurrence) Received adjuvant radiation from 05/03/2016 to 06/16/2016  Surveillance of breast cancer: 1. Breast exams periodically 2. Annual mammograms  Return to clinic in one year for surveillance checkups and follow-up

## 2016-06-29 NOTE — Telephone Encounter (Signed)
appt made and avs printed °

## 2016-07-01 ENCOUNTER — Encounter: Payer: Self-pay | Admitting: Cardiology

## 2016-07-01 DIAGNOSIS — Z6832 Body mass index (BMI) 32.0-32.9, adult: Secondary | ICD-10-CM | POA: Diagnosis not present

## 2016-07-05 DIAGNOSIS — M9901 Segmental and somatic dysfunction of cervical region: Secondary | ICD-10-CM | POA: Diagnosis not present

## 2016-07-05 DIAGNOSIS — M531 Cervicobrachial syndrome: Secondary | ICD-10-CM | POA: Diagnosis not present

## 2016-07-13 ENCOUNTER — Ambulatory Visit (INDEPENDENT_AMBULATORY_CARE_PROVIDER_SITE_OTHER): Payer: BLUE CROSS/BLUE SHIELD | Admitting: Physician Assistant

## 2016-07-13 ENCOUNTER — Encounter: Payer: Self-pay | Admitting: Physician Assistant

## 2016-07-13 ENCOUNTER — Ambulatory Visit (HOSPITAL_COMMUNITY): Payer: BLUE CROSS/BLUE SHIELD | Attending: Cardiology

## 2016-07-13 ENCOUNTER — Other Ambulatory Visit: Payer: Self-pay

## 2016-07-13 VITALS — BP 108/74 | HR 75 | Temp 97.9°F | Ht 68.0 in | Wt 211.8 lb

## 2016-07-13 DIAGNOSIS — R9439 Abnormal result of other cardiovascular function study: Secondary | ICD-10-CM

## 2016-07-13 DIAGNOSIS — L03315 Cellulitis of perineum: Secondary | ICD-10-CM | POA: Diagnosis not present

## 2016-07-13 DIAGNOSIS — R350 Frequency of micturition: Secondary | ICD-10-CM

## 2016-07-13 DIAGNOSIS — I429 Cardiomyopathy, unspecified: Secondary | ICD-10-CM | POA: Diagnosis not present

## 2016-07-13 DIAGNOSIS — I340 Nonrheumatic mitral (valve) insufficiency: Secondary | ICD-10-CM | POA: Diagnosis not present

## 2016-07-13 DIAGNOSIS — N952 Postmenopausal atrophic vaginitis: Secondary | ICD-10-CM

## 2016-07-13 DIAGNOSIS — I428 Other cardiomyopathies: Secondary | ICD-10-CM

## 2016-07-13 LAB — URINALYSIS, COMPLETE
BILIRUBIN UA: NEGATIVE
GLUCOSE, UA: NEGATIVE
NITRITE UA: NEGATIVE
PROTEIN UA: NEGATIVE
RBC UA: NEGATIVE
Specific Gravity, UA: >1.03 — ABNORMAL HIGH (ref 1.005–1.030)
UUROB: 0.2 mg/dL (ref 0.2–1.0)
pH, UA: 6 (ref 5.0–7.5)

## 2016-07-13 LAB — MICROSCOPIC EXAMINATION: Epithelial Cells (non renal): >10 /hpf — AB (ref 0–10)

## 2016-07-13 MED ORDER — AMOXICILLIN 250 MG PO CAPS: 250.0000 mg | ORAL_CAPSULE | Freq: Three times a day (TID) | ORAL | 0 refills | Status: DC

## 2016-07-13 MED ORDER — PERFLUTREN LIPID MICROSPHERE
1.0000 mL | INTRAVENOUS | Status: AC | PRN
  Administered 2016-07-13: 3 mL via INTRAVENOUS

## 2016-07-13 NOTE — Patient Instructions (Signed)

## 2016-07-13 NOTE — Progress Notes (Signed)
BP 108/74   Pulse 75   Temp 97.9 F (36.6 C) (Oral)   Ht 5\' 8"  (1.727 m)   Wt 211 lb 12.8 oz (96.1 kg)   BMI 32.20 kg/m    Subjective:    Patient ID: [name] [name], female    DOB: 1961-05-15, 55 y.o.   MRN: [id number]  [name] [name] is a 55 y.o. female presenting on 07/13/2016 for burns when urinates and Vaginal Itching   HPI Patient here to be established as new patient at [facility].  This patient is known to me from [facility]. This patient has had several days of perineal irritation and soreness. She states that it burns whenever she urinates. She was diagnosed with atrophic vaginitis at the gynecologist. She tried Premarin cream topically but found that it was very irritating to her vaginal area. She did have a new sexual partner and their was some oral sex and he does have facial hair. She felt like this irritated her. In addition the cologne and oils that he used on his skin seemed to have irritated her also. She denies any significant discharge or odor.  Relevant past medical, surgical, family and social history reviewed and updated as indicated. Interim medical history since our last visit reviewed. Allergies and medications reviewed and updated.   Data reviewed from any sources in EPIC.  Review of Systems  Constitutional: Negative.   HENT: Negative.   Eyes: Negative.   Respiratory: Negative.   Gastrointestinal: Negative.   Genitourinary: Positive for dyspareunia, dysuria and vaginal pain. Negative for decreased urine volume, frequency, hematuria, menstrual problem, pelvic pain, urgency, vaginal bleeding and vaginal discharge.    Per HPI unless specifically indicated above  Social History   Social History  . Marital status: Single    Spouse name: N/A  . Number of children: N/A  . Years of education: N/A   Occupational History  . Widow    Social History Main Topics  . Smoking status: Never Smoker  . Smokeless tobacco: Never Used   . Alcohol use No  . Drug use: No  . Sexual activity: Not on file   Other Topics Concern  . Not on file   Social History Narrative  . No narrative on file    Past Surgical History:  Procedure Laterality Date  . BREAST LUMPECTOMY WITH RADIOACTIVE SEED AND SENTINEL LYMPH NODE BIOPSY Left 04/05/2016   Procedure: BREAST LUMPECTOMY WITH RADIOACTIVE SEED AND SENTINEL LYMPH NODE BIOPSY;  Surgeon: [name], MD;  Location: [facility];  Service: General;  Laterality: Left;  . CARDIAC DEFIBRILLATOR PLACEMENT  [address] [employer] no remote  . COLONOSCOPY    . CYST EXCISION Left    back of leg-lt   . IMPLANTABLE CARDIOVERTER DEFIBRILLATOR GENERATOR CHANGE N/A 12/29/2014   Procedure: IMPLANTABLE CARDIOVERTER DEFIBRILLATOR GENERATOR CHANGE;  Surgeon: [name], MD;  Location: [facility] CATH LAB;  Service: Cardiovascular;  Laterality: N/A;  . LATERAL EPICONDYLE RELEASE  10/11/2012   Procedure: TENNIS ELBOW RELEASE;  Surgeon: [name], MD;  Location: [facility];  Service: Orthopedics;  Laterality: Right;  RIGHT ELBOW: TENOTOMY ELBOW LATERAL EPICONDYLITIS TENNIS ELBOW: RADIAL TUNNEL RELEASE  . PORT-A-CATH REMOVAL  04/13/2016   Procedure: MINOR REMOVAL PORT-A-CATH;  Surgeon: [name], MD;  Location: [facility];  Service: General;;  . PORTACATH PLACEMENT Right 04/05/2016   Procedure: INSERTION PORT-A-CATH WITH [place];  Surgeon: [name], MD;  Location: [facility];  Service:  General;  Laterality: Right;  right IJ  . TOTAL THYROIDECTOMY  2001  . TUBAL LIGATION  94  . UPPER GI ENDOSCOPY      Family History  Problem Relation Age of Onset  . Heart attack Mother   . Cancer Mother     breast  . Hypertension Sister   . Hypertension Brother   . Cancer Maternal Aunt     breast  . Stroke Neg Hx   . Diabetes Neg Hx       Medication List       Accurate as of 07/13/16  5:30 PM. Always use your most recent med list.          amoxicillin 250 MG capsule Commonly  known as:  AMOXIL Take 1 capsule (250 mg total) by mouth 3 (three) times daily.   aspirin EC 81 MG tablet Take 162 mg by mouth daily.   carvedilol 12.5 MG tablet Commonly known as:  COREG Take 1 tablet (12.5 mg total) by mouth 2 (two) times daily with a meal.   hyaluronate sodium Gel Apply 1 application topically 2 (two) times daily.   levothyroxine 88 MCG tablet Commonly known as:  SYNTHROID, LEVOTHROID Take 88 mcg by mouth daily.   non-metallic deodorant Misc Commonly known as:  ALRA Apply 1 application topically daily as needed.   omeprazole 20 MG capsule Commonly known as:  PRILOSEC Take 20 mg by mouth daily as needed (for heartburn or acid reflux).   sacubitril-valsartan 24-26 MG Commonly known as:  ENTRESTO Take 1 tablet by mouth 2 (two) times daily.          Objective:    BP 108/74   Pulse 75   Temp 97.9 F (36.6 C) (Oral)   Ht 5\' 8"  (1.727 m)   Wt 211 lb 12.8 oz (96.1 kg)   BMI 32.20 kg/m   Allergies  Allergen Reactions  . Avelox [Moxifloxacin Hcl In Nacl] Shortness Of Breath    Shortness of breath  . Clindamycin/Lincomycin Shortness Of Breath and Diarrhea    Breathing/diarrhea    . Kiwi Extract Anaphylaxis    Makes pt. Feel like her throat is closing   . Bactrim [Sulfamethoxazole-Trimethoprim] Swelling  . Levofloxacin Other (See Comments)    JOINT PAIN  . Moxifloxacin Other (See Comments)    Breathing problems Breathing problems  . Spironolactone Other (See Comments)    burning  . Sulfa Antibiotics Swelling   Wt Readings from Last 3 Encounters:  07/13/16 211 lb 12.8 oz (96.1 kg)  06/29/16 213 lb (96.6 kg)  06/15/16 212 lb 8 oz (96.4 kg)    Physical Exam  Constitutional: She is oriented to person, place, and time. She appears well-developed and well-nourished.  HENT:  Head: Normocephalic and atraumatic.  Eyes: Conjunctivae and EOM are normal. Pupils are equal, round, and reactive to light.  Cardiovascular: Normal rate, regular rhythm,  normal heart sounds and intact distal pulses.   Pulmonary/Chest: Effort normal and breath sounds normal.  Abdominal: Soft. Bowel sounds are normal. She exhibits no distension and no mass. There is tenderness in the suprapubic area. There is no rebound, no guarding and no CVA tenderness.  Neurological: She is alert and oriented to person, place, and time. She has normal reflexes.  Skin: Skin is warm and dry. No rash noted.  Psychiatric: She has a normal mood and affect. Her behavior is normal. Judgment and thought content normal.        Assessment & Plan:   1.  Frequent urination - Urinalysis, Complete - Urine culture  2. Vaginitis, atrophic Discussed with gynecologist at last visit. Can try smaller amount of Premarin cream given to her by gynecologist.  3. Cellulitis of perineum Mild soap. Amoxicillin 250 mg 1 tab by mouth 3 times a day 10 days no refill   Continue all other maintenance medications as listed above. Educational handout given for atrophic vaginitis.  Follow up plan: Follow up as needed or no improvement  Terald Sleeper PA-C [facility] [address]  [place], [place] [address] [phone]   07/13/2016, 5:30 PM

## 2016-07-14 LAB — URINE CULTURE

## 2016-07-18 ENCOUNTER — Telehealth: Payer: Self-pay | Admitting: Cardiology

## 2016-07-18 DIAGNOSIS — R9430 Abnormal result of cardiovascular function study, unspecified: Secondary | ICD-10-CM | POA: Insufficient documentation

## 2016-07-18 DIAGNOSIS — I428 Other cardiomyopathies: Secondary | ICD-10-CM

## 2016-07-18 NOTE — Telephone Encounter (Signed)
-----   Message from Dorothy Spark, MD sent at 07/16/2016  8:19 AM EDT ----- IV her LVEF has slightly decreased, now 30% I will discuss it with her at the next clinic visit.

## 2016-07-18 NOTE — Telephone Encounter (Signed)
Verbal order given by Dr [name] for this pt to have an exercise myoview done, for ischemic work-up and to evaluate decrease in LVEF.  Informed the pt of recommendations.  Went over exercise [employer] instructions with the pt over the phone.  Advised her to hold her carvedilol the morning of this test. Informed the pt that I will place the order in the system and have [facility] scheduling call her back to arrange this appt.  Pt verbalized understanding and agrees with this plan.

## 2016-07-18 NOTE — Progress Notes (Signed)
[name] [name], her LVEF is slightly worse, she has never had an ischemic workup, If she is willing to do it, I would recommend a nuclear stress test.

## 2016-07-18 NOTE — Telephone Encounter (Signed)
ECHOCARDIOGRAM COMPLETE  Order: [id number]  Status:  Final result Visible to patient:  Yes (MyChart) Dx:  Nonischemic cardiomyopathy ([place])  Notes Recorded by [name], LPN on [id number] at 075-GRM PM EDT Left a message for the pt to call back to endorse echo results and recommendations per Dr [name]. ------  Notes Recorded by Dorothy Spark, MD on 07/16/2016 at 8:19 AM EDT IV her LVEF has slightly decreased, now 30% I will discuss it with her at the next clinic visit. ------  Notes Recorded by Dorothy Spark, MD on 07/16/2016 at 8:18 AM EDT Linna Hoff, I have been following this patient that I inherited from Dr. Ron Parker, he has followed her for nonischemic cardiomyopathy (even though she never had ischemic work up), long-term her LVEF was 35-40%. It has dropped to 30-35% earlier this year and a switch her from ACE inhibitor to [place]. She was then diagnosed with breast cancer and underwent lumpectomy and adjuvant radiotherapy and she refused chemotherapy. Her LVEF has dropped to 30%. She is currently on Entresto and carvedilol, she didn't tolerate spironolactone and in the past couldn't afford BiDil, but currently with her blood pressure and low 100s wouldn't be even able to add it. Is there anything that you would recommend, also do see a decrease in LVEF shortly after radiation therapy, she just completed radiation therapy at the end of August. Is there anything else I am missing, I guess I could always do ischemic workup but she doesn't have symptoms. Thank you, Houston Siren   ------  Notes Recorded by Dorothy Spark, MD on 07/16/2016 at 8:11 AM EDT Dr Lindi Adie, This patient has worsening LVEF despite optimal medical therapy. I have looked into your note from earlier this month where you stated that she refused chemotherapy and only had surgery and adjuvant radiation treatment, however I just wanted to verify that she's not on any medication that can potentially impair her heart function. Also in  your experience do you see worsening LVEF after radiation? Thank you, [place] Siren

## 2016-07-18 NOTE — Telephone Encounter (Signed)
°  New message ° °Pt verbalized that she is returning call for the rn  °

## 2016-07-19 DIAGNOSIS — M9901 Segmental and somatic dysfunction of cervical region: Secondary | ICD-10-CM | POA: Diagnosis not present

## 2016-07-19 DIAGNOSIS — M531 Cervicobrachial syndrome: Secondary | ICD-10-CM | POA: Diagnosis not present

## 2016-07-21 ENCOUNTER — Other Ambulatory Visit: Payer: Self-pay

## 2016-07-21 ENCOUNTER — Ambulatory Visit
Admission: RE | Admit: 2016-07-21 | Payer: BLUE CROSS/BLUE SHIELD | Source: Ambulatory Visit | Admitting: Radiation Oncology

## 2016-07-21 DIAGNOSIS — C50212 Malignant neoplasm of upper-inner quadrant of left female breast: Secondary | ICD-10-CM

## 2016-07-21 NOTE — Progress Notes (Signed)
Per Dr. Geralyn Flash request, referral sent for pt to be seen by genetics.

## 2016-07-25 ENCOUNTER — Ambulatory Visit: Payer: BLUE CROSS/BLUE SHIELD | Admitting: Radiation Oncology

## 2016-07-25 ENCOUNTER — Telehealth (HOSPITAL_COMMUNITY): Payer: Self-pay | Admitting: *Deleted

## 2016-07-25 NOTE — Telephone Encounter (Signed)
Patient given detailed instructions per Myocardial Perfusion Study Information Sheet for the test on 07/25/16. Patient notified to arrive 15 minutes early and that it is imperative to arrive on time for appointment to keep from having the test rescheduled.  If you need to cancel or reschedule your appointment, please call the office within 24 hours of your appointment. Failure to do so may result in a cancellation of your appointment, and a $50 no show fee. Patient verbalized understanding.  Daelen Belvedere Jacqueline    

## 2016-07-26 ENCOUNTER — Telehealth: Payer: Self-pay | Admitting: Hematology and Oncology

## 2016-07-26 NOTE — Telephone Encounter (Signed)
lvm to inform pt of 11/8 appt per LOS

## 2016-07-27 ENCOUNTER — Ambulatory Visit (HOSPITAL_COMMUNITY): Payer: BLUE CROSS/BLUE SHIELD | Attending: Cardiovascular Disease

## 2016-07-27 DIAGNOSIS — R9430 Abnormal result of cardiovascular function study, unspecified: Secondary | ICD-10-CM | POA: Insufficient documentation

## 2016-07-27 DIAGNOSIS — I429 Cardiomyopathy, unspecified: Secondary | ICD-10-CM | POA: Diagnosis not present

## 2016-07-27 DIAGNOSIS — R0989 Other specified symptoms and signs involving the circulatory and respiratory systems: Secondary | ICD-10-CM

## 2016-07-27 DIAGNOSIS — I428 Other cardiomyopathies: Secondary | ICD-10-CM

## 2016-07-27 LAB — MYOCARDIAL PERFUSION IMAGING
Estimated workload: 5.8 METS
Exercise duration (min): 4 min
Exercise duration (sec): 32 s
LV dias vol: 163 mL (ref 46–106)
LV sys vol: 100 mL
MPHR: 164 {beats}/min
Peak HR: 151 {beats}/min
Percent HR: 92 %
RATE: 0.27
Rest HR: 55 {beats}/min
SDS: 5
SRS: 1
SSS: 6
TID: 0.99

## 2016-07-27 MED ORDER — TECHNETIUM TC 99M TETROFOSMIN IV KIT
32.7000 | PACK | Freq: Once | INTRAVENOUS | Status: AC | PRN
  Administered 2016-07-27: 32.7 via INTRAVENOUS
  Filled 2016-07-27: qty 33

## 2016-07-27 MED ORDER — TECHNETIUM TC 99M TETROFOSMIN IV KIT
11.0000 | PACK | Freq: Once | INTRAVENOUS | Status: AC | PRN
  Administered 2016-07-27: 11 via INTRAVENOUS
  Filled 2016-07-27: qty 11

## 2016-07-29 DIAGNOSIS — R110 Nausea: Secondary | ICD-10-CM | POA: Diagnosis not present

## 2016-07-29 DIAGNOSIS — S336XXA Sprain of sacroiliac joint, initial encounter: Secondary | ICD-10-CM | POA: Diagnosis not present

## 2016-08-01 ENCOUNTER — Telehealth: Payer: Self-pay | Admitting: *Deleted

## 2016-08-01 ENCOUNTER — Encounter: Payer: Self-pay | Admitting: *Deleted

## 2016-08-01 DIAGNOSIS — Z01812 Encounter for preprocedural laboratory examination: Secondary | ICD-10-CM

## 2016-08-01 DIAGNOSIS — R9439 Abnormal result of other cardiovascular function study: Secondary | ICD-10-CM | POA: Insufficient documentation

## 2016-08-01 DIAGNOSIS — E7849 Other hyperlipidemia: Secondary | ICD-10-CM

## 2016-08-01 NOTE — Telephone Encounter (Signed)
Notified the pt that per Dr [name], her stress test was suggestive of inferior wall ischemia and she recommends that we schedule for her to have a left-sided cardiac cath to be done.  Informed the pt that Dr [name] prefers for her to have her cardiac cath done sometime this week, so she will be able to see her in follow-up, as planned for next Thursday 10/19 at 1045.   Pt agrees to this, and would like to come in for her pre-cath labs for tomorrow 10/10 and schedule her cath to be done on this Friday 08/05/16, as early as possible.   Scheduled the pts pre-cath labs for tomorrow 10/10 at our office to check a bmet, PT/INR, and cbc w diff.  Scheduled the pts left cardiac cath for this Friday 08/05/16 at 0900 with Dr Martinique to do.  Pt to arrive at 0700.  Informed the pt that she must arrive at [facility] on Friday 10/13 at 0700 the day of her scheduled cath.  Went over the pts cath instructions with her over the phone, and informed her that when she comes for her pre-cath labs tomorrow, there will be a cardiac cath instruction letter for her to pick up at the front desk, upon arrival.  Informed the pt that she should ask the greeter at the front desk for this.  Informed the pt that if she has any questions regarding her cath instructions, she can ask to speak with myself, or a triage nurse, and we will be glad to assist her with this.   Pt verbalized understanding, agrees with this plan, and gracious for all the assistance provided.   Will send Dr [name] a message to place hospital orders in the system, and pre-cert a message to have this procedure pre-certed through her carrier.

## 2016-08-01 NOTE — Telephone Encounter (Signed)
-----   Message from Dorothy Spark, MD sent at 07/30/2016  7:05 PM EDT ----- Karlene Einstein, her stress test suggestive of inferior wall ischemia please schedule a left-sided cardiac cath. Thank you, [place] Siren   ----- Message ----- From: Dorothy Spark, MD Sent: 07/27/2016   4:50 PM To: Dorothy Spark, MD  Look up this nuclear study

## 2016-08-02 ENCOUNTER — Other Ambulatory Visit: Payer: BLUE CROSS/BLUE SHIELD | Admitting: *Deleted

## 2016-08-02 DIAGNOSIS — R9439 Abnormal result of other cardiovascular function study: Secondary | ICD-10-CM | POA: Diagnosis not present

## 2016-08-02 DIAGNOSIS — E784 Other hyperlipidemia: Secondary | ICD-10-CM | POA: Diagnosis not present

## 2016-08-02 DIAGNOSIS — E7849 Other hyperlipidemia: Secondary | ICD-10-CM

## 2016-08-02 DIAGNOSIS — M9901 Segmental and somatic dysfunction of cervical region: Secondary | ICD-10-CM | POA: Diagnosis not present

## 2016-08-02 DIAGNOSIS — M531 Cervicobrachial syndrome: Secondary | ICD-10-CM | POA: Diagnosis not present

## 2016-08-02 DIAGNOSIS — Z01812 Encounter for preprocedural laboratory examination: Secondary | ICD-10-CM

## 2016-08-02 LAB — BASIC METABOLIC PANEL
BUN: 12 mg/dL (ref 7–25)
CO2: 25 mmol/L (ref 20–31)
Calcium: 9.2 mg/dL (ref 8.6–10.4)
Chloride: 110 mmol/L (ref 98–110)
Creat: 0.93 mg/dL (ref 0.50–1.05)
Glucose, Bld: 76 mg/dL (ref 65–99)
Potassium: 4 mmol/L (ref 3.5–5.3)
Sodium: 144 mmol/L (ref 135–146)

## 2016-08-02 LAB — CBC WITH DIFFERENTIAL/PLATELET
Basophils Absolute: 39 cells/uL (ref 0–200)
Basophils Relative: 1 %
Eosinophils Absolute: 312 cells/uL (ref 15–500)
Eosinophils Relative: 8 %
HCT: 33.9 % — ABNORMAL LOW (ref 35.0–45.0)
Hemoglobin: 11.5 g/dL — ABNORMAL LOW (ref 11.7–15.5)
Lymphocytes Relative: 32 %
Lymphs Abs: 1248 cells/uL (ref 850–3900)
MCH: 30.6 pg (ref 27.0–33.0)
MCHC: 33.9 g/dL (ref 32.0–36.0)
MCV: 90.2 fL (ref 80.0–100.0)
MPV: 10.2 fL (ref 7.5–12.5)
Monocytes Absolute: 312 cells/uL (ref 200–950)
Monocytes Relative: 8 %
Neutro Abs: 1989 cells/uL (ref 1500–7800)
Neutrophils Relative %: 51 %
Platelets: 176 10*3/uL (ref 140–400)
RBC: 3.76 MIL/uL — ABNORMAL LOW (ref 3.80–5.10)
RDW: 13.7 % (ref 11.0–15.0)
WBC: 3.9 10*3/uL (ref 3.8–10.8)

## 2016-08-02 LAB — PROTIME-INR
INR: 1
Prothrombin Time: 10.5 s (ref 9.0–11.5)

## 2016-08-02 NOTE — Addendum Note (Signed)
Addended by: Dorothy Spark on: 08/02/2016 08:18 AM   Modules accepted: Orders, SmartSet

## 2016-08-02 NOTE — Addendum Note (Signed)
Addended by: Dorothy Spark on: 08/02/2016 08:52 PM   Modules accepted: Orders, SmartSet

## 2016-08-05 ENCOUNTER — Encounter (HOSPITAL_COMMUNITY)
Admission: RE | Disposition: A | Discharge status: Home / Self Care | Payer: Self-pay | Source: Ambulatory Visit | Attending: Cardiology

## 2016-08-05 ENCOUNTER — Other Ambulatory Visit: Payer: Self-pay

## 2016-08-05 ENCOUNTER — Ambulatory Visit (HOSPITAL_COMMUNITY)
Admission: RE | Admit: 2016-08-05 | Discharge: 2016-08-05 | Disposition: A | Discharge status: Home / Self Care | Payer: BLUE CROSS/BLUE SHIELD | Source: Ambulatory Visit | Attending: Cardiology | Admitting: Cardiology

## 2016-08-05 ENCOUNTER — Encounter (HOSPITAL_COMMUNITY): Payer: Self-pay | Admitting: *Deleted

## 2016-08-05 DIAGNOSIS — E039 Hypothyroidism, unspecified: Secondary | ICD-10-CM | POA: Diagnosis not present

## 2016-08-05 DIAGNOSIS — Z803 Family history of malignant neoplasm of breast: Secondary | ICD-10-CM | POA: Insufficient documentation

## 2016-08-05 DIAGNOSIS — K219 Gastro-esophageal reflux disease without esophagitis: Secondary | ICD-10-CM | POA: Diagnosis not present

## 2016-08-05 DIAGNOSIS — Z8673 Personal history of transient ischemic attack (TIA), and cerebral infarction without residual deficits: Secondary | ICD-10-CM | POA: Diagnosis not present

## 2016-08-05 DIAGNOSIS — R9439 Abnormal result of other cardiovascular function study: Secondary | ICD-10-CM | POA: Diagnosis not present

## 2016-08-05 DIAGNOSIS — M1990 Unspecified osteoarthritis, unspecified site: Secondary | ICD-10-CM | POA: Diagnosis not present

## 2016-08-05 DIAGNOSIS — C50912 Malignant neoplasm of unspecified site of left female breast: Secondary | ICD-10-CM | POA: Diagnosis not present

## 2016-08-05 DIAGNOSIS — Z882 Allergy status to sulfonamides status: Secondary | ICD-10-CM | POA: Insufficient documentation

## 2016-08-05 DIAGNOSIS — Z95810 Presence of automatic (implantable) cardiac defibrillator: Secondary | ICD-10-CM | POA: Diagnosis not present

## 2016-08-05 DIAGNOSIS — T464X5A Adverse effect of angiotensin-converting-enzyme inhibitors, initial encounter: Secondary | ICD-10-CM | POA: Diagnosis present

## 2016-08-05 DIAGNOSIS — R9430 Abnormal result of cardiovascular function study, unspecified: Secondary | ICD-10-CM | POA: Diagnosis present

## 2016-08-05 DIAGNOSIS — R058 Other specified cough: Secondary | ICD-10-CM | POA: Diagnosis present

## 2016-08-05 DIAGNOSIS — Z7982 Long term (current) use of aspirin: Secondary | ICD-10-CM | POA: Diagnosis not present

## 2016-08-05 DIAGNOSIS — Z8249 Family history of ischemic heart disease and other diseases of the circulatory system: Secondary | ICD-10-CM | POA: Insufficient documentation

## 2016-08-05 DIAGNOSIS — R05 Cough: Secondary | ICD-10-CM | POA: Diagnosis present

## 2016-08-05 DIAGNOSIS — E785 Hyperlipidemia, unspecified: Secondary | ICD-10-CM | POA: Insufficient documentation

## 2016-08-05 DIAGNOSIS — I429 Cardiomyopathy, unspecified: Secondary | ICD-10-CM | POA: Insufficient documentation

## 2016-08-05 DIAGNOSIS — I428 Other cardiomyopathies: Secondary | ICD-10-CM

## 2016-08-05 DIAGNOSIS — I5022 Chronic systolic (congestive) heart failure: Secondary | ICD-10-CM | POA: Diagnosis present

## 2016-08-05 DIAGNOSIS — Z923 Personal history of irradiation: Secondary | ICD-10-CM | POA: Diagnosis not present

## 2016-08-05 DIAGNOSIS — I5042 Chronic combined systolic (congestive) and diastolic (congestive) heart failure: Secondary | ICD-10-CM | POA: Diagnosis present

## 2016-08-05 DIAGNOSIS — C50919 Malignant neoplasm of unspecified site of unspecified female breast: Secondary | ICD-10-CM | POA: Diagnosis present

## 2016-08-05 HISTORY — PX: CARDIAC CATHETERIZATION: SHX172

## 2016-08-05 SURGERY — LEFT HEART CATH AND CORONARY ANGIOGRAPHY

## 2016-08-05 MED ORDER — VERAPAMIL HCL 2.5 MG/ML IV SOLN
INTRAVENOUS | Status: DC | PRN
  Administered 2016-08-05: 10 mL via INTRA_ARTERIAL

## 2016-08-05 MED ORDER — LIDOCAINE HCL (PF) 1 % IJ SOLN
INTRAMUSCULAR | Status: AC
  Filled 2016-08-05: qty 30

## 2016-08-05 MED ORDER — HEPARIN (PORCINE) IN NACL 2-0.9 UNIT/ML-% IJ SOLN
INTRAMUSCULAR | Status: DC | PRN
  Administered 2016-08-05: 1000 mL

## 2016-08-05 MED ORDER — LIDOCAINE HCL (PF) 1 % IJ SOLN
INTRAMUSCULAR | Status: DC | PRN
  Administered 2016-08-05: 2 mL via INTRADERMAL

## 2016-08-05 MED ORDER — IOPAMIDOL (ISOVUE-370) INJECTION 76%
INTRAVENOUS | Status: DC | PRN
  Administered 2016-08-05: 75 mL via INTRAVENOUS

## 2016-08-05 MED ORDER — VERAPAMIL HCL 2.5 MG/ML IV SOLN
INTRAVENOUS | Status: AC
  Filled 2016-08-05: qty 2

## 2016-08-05 MED ORDER — SODIUM CHLORIDE 0.9 % IV SOLN: 250.0000 mL | INTRAVENOUS | Status: DC | PRN

## 2016-08-05 MED ORDER — HEPARIN (PORCINE) IN NACL 2-0.9 UNIT/ML-% IJ SOLN
INTRAMUSCULAR | Status: AC
  Filled 2016-08-05: qty 1000

## 2016-08-05 MED ORDER — HEPARIN SODIUM (PORCINE) 1000 UNIT/ML IJ SOLN
INTRAMUSCULAR | Status: AC
  Filled 2016-08-05: qty 1

## 2016-08-05 MED ORDER — MIDAZOLAM HCL 2 MG/2ML IJ SOLN
INTRAMUSCULAR | Status: AC
  Filled 2016-08-05: qty 2

## 2016-08-05 MED ORDER — IOPAMIDOL (ISOVUE-370) INJECTION 76%
INTRAVENOUS | Status: AC
  Filled 2016-08-05: qty 100

## 2016-08-05 MED ORDER — SODIUM CHLORIDE 0.9 % WEIGHT BASED INFUSION: 1.0000 mL/kg/h | INTRAVENOUS | Status: DC

## 2016-08-05 MED ORDER — MIDAZOLAM HCL 2 MG/2ML IJ SOLN
INTRAMUSCULAR | Status: DC | PRN
  Administered 2016-08-05: 1 mg via INTRAVENOUS

## 2016-08-05 MED ORDER — FENTANYL CITRATE (PF) 100 MCG/2ML IJ SOLN
INTRAMUSCULAR | Status: AC
  Filled 2016-08-05: qty 2

## 2016-08-05 MED ORDER — SODIUM CHLORIDE 0.9% FLUSH: 3.0000 mL | INTRAVENOUS | Status: DC | PRN

## 2016-08-05 MED ORDER — FENTANYL CITRATE (PF) 100 MCG/2ML IJ SOLN
INTRAMUSCULAR | Status: DC | PRN
  Administered 2016-08-05: 25 ug via INTRAVENOUS

## 2016-08-05 MED ORDER — SODIUM CHLORIDE 0.9% FLUSH: 3.0000 mL | Freq: Two times a day (BID) | INTRAVENOUS | Status: DC

## 2016-08-05 MED ORDER — SODIUM CHLORIDE 0.9 % WEIGHT BASED INFUSION
3.0000 mL/kg/h | INTRAVENOUS | Status: DC
  Administered 2016-08-05: 3 mL/kg/h via INTRAVENOUS

## 2016-08-05 MED ORDER — HEPARIN SODIUM (PORCINE) 1000 UNIT/ML IJ SOLN
INTRAMUSCULAR | Status: DC | PRN
  Administered 2016-08-05: 5000 [IU] via INTRAVENOUS

## 2016-08-05 MED ORDER — ASPIRIN 81 MG PO CHEW: 81.0000 mg | CHEWABLE_TABLET | ORAL | Status: DC

## 2016-08-05 SURGICAL SUPPLY — 10 items
CATH 5FR JL3.5 JR4 ANG PIG MP (CATHETERS) ×2 IMPLANT
DEVICE RAD COMP TR BAND LRG (VASCULAR PRODUCTS) ×2 IMPLANT
GLIDESHEATH SLEND SS 6F .021 (SHEATH) ×2 IMPLANT
KIT HEART LEFT (KITS) ×2 IMPLANT
PACK CARDIAC CATHETERIZATION (CUSTOM PROCEDURE TRAY) ×2 IMPLANT
SYR MEDRAD MARK V 150ML (SYRINGE) ×2 IMPLANT
TRANSDUCER W/STOPCOCK (MISCELLANEOUS) ×2 IMPLANT
TUBING CIL FLEX 10 FLL-RA (TUBING) ×2 IMPLANT
WIRE HI TORQ VERSACORE-J 145CM (WIRE) ×2 IMPLANT
WIRE SAFE-T 1.5MM-J .035X260CM (WIRE) ×2 IMPLANT

## 2016-08-05 NOTE — Discharge Instructions (Signed)
Excuse from Work, [employer], or Physical Activity ______________________TAMMY LOWE____________________________ needs to be excused from: ____X _ Work _____ [employer] _____ Physical activity beginning now and through the following date: ______10/18/2017______________. _____ He or she may return to work or school but should still avoid the following physical activity or activities from now until ____________________. Activity restrictions include: _____ Lifting more than _______ lb _____ Sitting longer than __________ minutes at a time _____ Standing longer than ________ minutes at a time _____ He or she may return to full physical activity as of ____________________. Health Care Provider Name (printed): [facility] Devone Tousley,RN_______________________________________  Health Care Provider (signature): ___________________________________________ Date: 10/13/2017________________   This information is not intended to replace advice given to you by your health care provider. Make sure you discuss any questions you have with your health care provider.   Document Released: 04/05/2001 Document Revised: 10/31/2014 Document Reviewed: 05/12/2014 Elsevier Interactive Patient Education 2016 [place] Refer to this sheet in the next few weeks. These instructions provide you with information about caring for yourself after your procedure. Your health care provider may also give you more specific instructions. Your treatment has been planned according to current medical practices, but problems sometimes occur. Call your health care provider if you have any problems or questions after your procedure. WHAT TO EXPECT AFTER THE PROCEDURE After your procedure, it is typical to have the following:  Bruising at the radial site that usually fades within 1-2 weeks.  Blood collecting in the tissue (hematoma) that may be painful to the touch. It should usually decrease in size and tenderness within 1-2  weeks. HOME CARE INSTRUCTIONS  Take medicines only as directed by your health care provider.  You may shower 24-48 hours after the procedure or as directed by your health care provider. Remove the bandage (dressing) and gently wash the site with plain soap and water. Pat the area dry with a clean towel. Do not rub the site, because this may cause bleeding.  Do not take baths, swim, or use a hot tub until your health care provider approves.  Check your insertion site every day for redness, swelling, or drainage.  Do not apply powder or lotion to the site.  Do not flex or bend the affected arm for 24 hours or as directed by your health care provider.  Do not push or pull heavy objects with the affected arm for 24 hours or as directed by your health care provider.  Do not lift over 10 lb (4.5 kg) for 5 days after your procedure or as directed by your health care provider.  Ask your health care provider when it is okay to:  Return to work or school.  Resume usual physical activities or sports.  Resume sexual activity.  Do not drive home if you are discharged the same day as the procedure. Have someone else drive you.  You may drive 24 hours after the procedure unless otherwise instructed by your health care provider.  Do not operate machinery or power tools for 24 hours after the procedure.  If your procedure was done as an outpatient procedure, which means that you went home the same day as your procedure, a responsible adult should be with you for the first 24 hours after you arrive home.  Keep all follow-up visits as directed by your health care provider. This is important. SEEK MEDICAL CARE IF:  You have a fever.  You have chills.  You have increased bleeding from the radial site. Hold  pressure on the site. SEEK IMMEDIATE MEDICAL CARE IF:  You have unusual pain at the radial site.  You have redness, warmth, or swelling at the radial site.  You have drainage (other  than a small amount of blood on the dressing) from the radial site.  The radial site is bleeding, and the bleeding does not stop after 30 minutes of holding steady pressure on the site.  Your arm or hand becomes pale, cool, tingly, or numb.   This information is not intended to replace advice given to you by your health care provider. Make sure you discuss any questions you have with your health care provider.   Document Released: 11/12/2010 Document Revised: 10/31/2014 Document Reviewed: 04/28/2014 Elsevier Interactive Patient Education [employer].

## 2016-08-05 NOTE — Interval H&P Note (Signed)
History and Physical Interval Note:  08/05/2016 10:12 AM  [name] [name]  has presented today for surgery, with the diagnosis of abnormal stress  The various methods of treatment have been discussed with the patient and family. After consideration of risks, benefits and other options for treatment, the patient has consented to  Procedure(s): Left Heart Cath and Coronary Angiography (N/A) as a surgical intervention .  The patient's history has been reviewed, patient examined, no change in status, stable for surgery.  I have reviewed the patient's chart and labs.  Questions were answered to the patient's satisfaction.   Cath Lab Visit (complete for each Cath Lab visit)  Clinical Evaluation Leading to the Procedure:   ACS: No.  Non-ACS:    Anginal Classification: No Symptoms  Anti-ischemic medical therapy: Minimal Therapy (1 class of medications)  Non-Invasive Test Results: Intermediate-risk stress test findings: cardiac mortality 1-3%/year  Prior CABG: No previous CABG        [name] [name] [facility] 08/05/2016 10:12 AM

## 2016-08-05 NOTE — H&P (Signed)
Patient ID: [name] [name] MRN: [id number], DOB/AGE: [birth year]   Admit date: 08/05/2016   Primary Physician: Terald Sleeper, PA-C Primary Cardiologist: Dr Nelson/ Dr [name] EP  HPI:  Pleasant 55 y/o widowed AA female with two children, works at [employer]) in [place], who had previously been followed by Dr Ron Parker, now Dr [name]. In 2007 she had a TIA. Subsequent cardiology work up revealed (presumed) NICM. In 2009 she had a BS ICD place and had a gen change in March 2016 (Dr Renaldo Reel). In May of 2017 she was diagnosed with breast cancer. She underwent biopsy and seed implant but declined chemotherapy. In September she had an echo that showed a drop in her EF from 35% to 30%. Dr [name] noted that the pt had never had an ischemic work up and suggested a Myoview. This was done 104/17 and was abnormal with inferior ischemia. The pt has no chest pain, no symptoms of CHF. She is admitted now for an elective cath to r/o CAD as a cause of her CM.    Problem List: Past Medical History:  Diagnosis Date  . AICD (automatic cardioverter/defibrillator) present 09   [employer]- replaced 2016  . Arthritis   . Bradycardia   . Breast cancer ([place]) 02/24/16   Left breast cancer  . CHF (congestive heart failure) ([place])   . Cough due to angiotensin-converting enzyme inhibitor    But patient chooses to continue ACE  . Dizziness    Dizziness with standing after squatting, March, 2012  . Dyslipidemia    LDL elevation,Patient does not want statin  . Ejection fraction < 50%   . GERD (gastroesophageal reflux disease)   . Hypothyroidism   . ICD (implantable cardiac defibrillator) battery depletion    Dr. [name], June, 2009(artifactual atrial tachycardia response events addressed by reprogramming in parentheses  . Itching   . Leg swelling    right leg  . Nonischemic cardiomyopathy ([place])    Etiology unknown, diagnosed 2008, patient has never had catheterization or nuclear study as of March, 2011  .  Pain    left leg & left knee  . Stroke ([place]) 07   tia  . TIA (transient ischemic attack)    No CT or MRI abnormality, aspirin therapy  . Weight gain     Past Surgical History:  Procedure Laterality Date  . BREAST LUMPECTOMY WITH RADIOACTIVE SEED AND SENTINEL LYMPH NODE BIOPSY Left 04/05/2016   Procedure: BREAST LUMPECTOMY WITH RADIOACTIVE SEED AND SENTINEL LYMPH NODE BIOPSY;  Surgeon: [name], MD;  Location: [place];  Service: General;  Laterality: Left;  . CARDIAC DEFIBRILLATOR PLACEMENT  [address] [employer] no remote  . COLONOSCOPY    . CYST EXCISION Left    back of leg-lt   . IMPLANTABLE CARDIOVERTER DEFIBRILLATOR GENERATOR CHANGE N/A 12/29/2014   Procedure: IMPLANTABLE CARDIOVERTER DEFIBRILLATOR GENERATOR CHANGE;  Surgeon: [name], MD;  Location[facility] C[facility] LAB;  Service: Cardiovascular;  Laterality: N/A;  . LATERAL EPICONDYLE RELEASE  10/11/2012   Procedure: TENNIS ELBOW RELEASE;  Surgeon: [name], MD;  Location: [place];  Service: Orthopedics;  Laterality: Right;  RIGHT ELBOW: TENOTOMY ELBOW LATERAL EPICONDYLITIS TENNIS ELBOW: RADIAL TUNNEL RELEASE  . PORT-A-CATH REMOVAL  04/13/2016   Procedure: MINOR REMOVAL PORT-A-CATH;  Surgeon: [name], MD;  Location: [place];  Service: General;;  . PORTACATH PLACEMENT Right 04/05/2016   Procedure: INSERTION PORT-A-CATH WITH [place];  Surgeon: [name], MD;  Location:  [facility] OR;  Service: General;  Laterality: Right;  right IJ  . TOTAL THYROIDECTOMY  2001  . TUBAL LIGATION  94  . UPPER GI ENDOSCOPY       Allergies:  Allergies  Allergen Reactions  . Avelox [Moxifloxacin Hcl In Nacl] Shortness Of Breath    Shortness of breath  . Clindamycin/Lincomycin Shortness Of Breath and Diarrhea    Breathing/diarrhea    . Kiwi Extract Anaphylaxis    Makes pt. Feel like her throat is closing   . Bactrim [Sulfamethoxazole-Trimethoprim] Swelling  . Levofloxacin Other (See Comments)    JOINT PAIN    . Moxifloxacin Other (See Comments)    Breathing problems Breathing problems  . Spironolactone Other (See Comments)    burning  . Sulfa Antibiotics Swelling     Home Medications Prior to Admission medications   Medication Sig Start Date End Date Taking? Authorizing Provider  aspirin EC 81 MG tablet Take 162 mg by mouth daily.  11/22/11  Yes [name], MD  carvedilol (COREG) 12.5 MG tablet Take 1 tablet (12.5 mg total) by mouth 2 (two) times daily with a meal. 10/21/15  Yes Isaiah Serge, NP  levothyroxine (SYNTHROID, LEVOTHROID) 88 MCG tablet Take 88 mcg by mouth daily.    Yes Historical Provider, MD  omeprazole (PRILOSEC) 20 MG capsule Take 20 mg by mouth daily as needed (for heartburn or acid reflux).    Yes Historical Provider, MD  sacubitril-valsartan (ENTRESTO) 24-26 MG Take 1 tablet by mouth 2 (two) times daily. 01/14/16  Yes Dorothy Spark, MD     Family History  Problem Relation Age of Onset  . Heart attack Mother   . Cancer Mother     breast  . Hypertension Sister   . Hypertension Brother   . Cancer Maternal Aunt     breast  . Stroke Neg Hx   . Diabetes Neg Hx      Social History   Social History  . Marital status: Single    Spouse name: N/A  . Number of children: N/A  . Years of education: N/A   Occupational History  . Widow    Social History Main Topics  . Smoking status: Never Smoker  . Smokeless tobacco: Never Used  . Alcohol use No  . Drug use: No  . Sexual activity: Not on file   Other Topics Concern  . Not on file   Social History Narrative  . No narrative on file     Review of Systems: General: negative for chills, fever, night sweats or weight changes.  Cardiovascular: negative for chest pain, dyspnea on exertion, edema, orthopnea, palpitations, paroxysmal nocturnal dyspnea or shortness of breath HEENT: negative for any visual disturbances, blindness, glaucoma Dermatological: negative for rash Respiratory: negative for cough,  hemoptysis, or wheezing Urologic: negative for hematuria or dysuria Abdominal: negative for nausea, vomiting, diarrhea, bright red blood per rectum, melena, or hematemesis Neurologic: negative for visual changes, syncope, or dizziness Musculoskeletal: negative for back pain, joint pain, or swelling Psych: cooperative and appropriate All other systems reviewed and are otherwise negative except as noted above.  Physical Exam: Blood pressure 125/81, pulse (!) 54, temperature 97.7 F (36.5 C), temperature source Oral, resp. rate 18, height 5\' 8"  (1.727 m), weight 211 lb (95.7 kg), SpO2 100 %.  General appearance: alert, cooperative, no distress and mildly obese Neck: no carotid bruit, no JVD and thyroid surgery scar noted Lungs: clear to auscultation bilaterally Heart: regular rate and rhythm Abdomen: soft,  non-tender; bowel sounds normal; no masses,  no organomegaly Extremities: extremities normal, atraumatic, no cyanosis or edema Pulses: 2+ and symmetric Skin: Skin color, texture, turgor normal. No rashes or lesions Neurologic: Grossly normal    Labs:  No results found for this or any previous visit (from the past 24 hour(s)).   Radiology/Studies: No results found.  EKG :A-Paced  ASSESSMENT AND PLAN:  Principal Problem:   Abnormal stress test Active Problems:   Nonischemic cardiomyopathy (H[facility])   Hypothyroidism   Cough due to angiotensin-converting enzyme inhibitor   Dual implantable cardioverter-defibrillator in situ   Chronic systolic CHF (congestive heart failure) (H[facility])   Cardiac LV ejection fraction 30-35%   Breast cancer- radiation June 2016   PLAN: Diagnostic cath today. The patient understands that risks included but are not limited to stroke (1 in 1000), death (1 in 74), kidney failure [usually temporary] (1 in 500), bleeding (1 in 200), allergic reaction [possibly serious] (1 in 200).  The patient understands and agrees to proceed.    Angelena Form,  PA-C 08/05/2016, 8:27 AM [phone]  Patient seen and examined and history reviewed. Agree with above findings and plan. 55 yo BF with long history of presumed nonischemic cardiomyopathy. No known ischemic work up in the past. Now EF has dropped to 30%. myoview is abnormal with inferior ischemia. Cardiac cath indicated. Discussed procedure/risks with patient and she is agreeable to proceed.  Peter Martinique, [place] 08/05/2016 10:09 AM

## 2016-08-10 ENCOUNTER — Encounter: Payer: Self-pay | Admitting: Radiation Oncology

## 2016-08-10 ENCOUNTER — Ambulatory Visit
Admission: RE | Admit: 2016-08-10 | Discharge: 2016-08-10 | Disposition: A | Discharge status: Home / Self Care | Payer: BLUE CROSS/BLUE SHIELD | Source: Ambulatory Visit | Attending: Radiation Oncology | Admitting: Radiation Oncology

## 2016-08-10 VITALS — BP 138/71 | Temp 98.2°F | Ht 68.0 in | Wt 220.0 lb

## 2016-08-10 DIAGNOSIS — C50912 Malignant neoplasm of unspecified site of left female breast: Secondary | ICD-10-CM | POA: Diagnosis not present

## 2016-08-10 DIAGNOSIS — Z7982 Long term (current) use of aspirin: Secondary | ICD-10-CM | POA: Diagnosis not present

## 2016-08-10 DIAGNOSIS — Z923 Personal history of irradiation: Secondary | ICD-10-CM | POA: Diagnosis not present

## 2016-08-10 DIAGNOSIS — Z79899 Other long term (current) drug therapy: Secondary | ICD-10-CM | POA: Insufficient documentation

## 2016-08-10 DIAGNOSIS — Z171 Estrogen receptor negative status [ER-]: Secondary | ICD-10-CM

## 2016-08-10 DIAGNOSIS — C50212 Malignant neoplasm of upper-inner quadrant of left female breast: Secondary | ICD-10-CM

## 2016-08-10 NOTE — Addendum Note (Signed)
Encounter addended by: Benn Moulder, RN on: 08/10/2016  4:35 PM<BR>    Actions taken: Charge Capture section accepted

## 2016-08-10 NOTE — Progress Notes (Addendum)
[name] [name] returns for FU S/P XRT to her to her left breast.  She reports intermittent shooting pains in her breast.  She reports intermittent dull ache in the left scapular region and her lower back, but not at the present time - relief with Advil. No fatigue. Working full-time. Has gained 9 lbs since 08/05/16.  BP 138/71 (BP Location: Right Arm, Patient Position: Sitting, Cuff Size: Normal)   Temp 98.2 F (36.8 C) (Oral)   Ht 5\' 8"  (1.727 m)   Wt 220 lb (99.8 kg)   BMI 33.45 kg/m    Wt Readings from Last 3 Encounters:  08/10/16 220 lb (99.8 kg)  08/05/16 211 lb (95.7 kg)  07/13/16 211 lb 12.8 oz (96.1 kg)

## 2016-08-10 NOTE — Progress Notes (Addendum)
  Radiation Oncology         [phone] ________________________________  Name: [name] [name] MRN: [id number]  Date: 08/10/2016  DOB: [birth year]  Post Treatment Note  CC: Orpha Bur, MD  Diagnosis:   Stage IIA, T2N0 triple negative, grade 3 invasive ductal carcinoma with associated high grade DCIS of the left breast.  Interval Since Last Radiation:  7 weeks   05/03/2016-06/16/2016: 1. Left breast boost // 10 Gy with 5 fractions at a dose of 2 Gy/fraction 2. Left breast// 50.4 Gy with 28 fractions at a dose of 1.8 Gy/ fraction  Narrative:  The patient returns today for routine follow-up. She tolerated radiotherapy well. She had a PCI procedure last week which did not reveal any obstruction of her coronary vessels. She reports intermittent discomfort in her left breast and back. She continues to use radiaplex over her skin. No other complaints are noted.                             ALLERGIES:  is allergic to avelox [moxifloxacin hcl in nacl]; clindamycin/lincomycin; kiwi extract; bactrim [sulfamethoxazole-trimethoprim]; levofloxacin; moxifloxacin; spironolactone; and sulfa antibiotics.  Meds: Current Outpatient Prescriptions  Medication Sig Dispense Refill  . aspirin EC 81 MG tablet Take 162 mg by mouth daily.     . carvedilol (COREG) 12.5 MG tablet Take 1 tablet (12.5 mg total) by mouth 2 (two) times daily with a meal. 60 tablet 9  . levothyroxine (SYNTHROID, LEVOTHROID) 88 MCG tablet Take 88 mcg by mouth daily.     [name] omeprazole (PRILOSEC) 20 MG capsule Take 20 mg by mouth daily as needed (for heartburn or acid reflux).     . sacubitril-valsartan (ENTRESTO) 24-26 MG Take 1 tablet by mouth 2 (two) times daily. (Patient not taking: Reported on 08/10/2016) 180 tablet 3   No current facility-administered medications for this encounter.     Physical Findings:  height is 5\' 8"  (1.727 m) and weight is 220 lb (99.8 kg). Her oral temperature is 98.2 F (36.8 C). Her  blood pressure is 138/71.  In general this is a well appearing African American female in no acute distress. She's alert and oriented x4 and appropriate throughout the examination. Cardiopulmonary assessment is negative for acute distress and she exhibits normal effort. The left breast is notable for hyperpigmentation without desquamation.   Lab Findings: Lab Results  Component Value Date   WBC 3.9 08/02/2016   HGB 11.5 (L) 08/02/2016   HCT 33.9 (L) 08/02/2016   MCV 90.2 08/02/2016   PLT 176 08/02/2016     Radiographic Findings: No results found.  Impression/Plan: 1. Stage IIA, T2N0 triple negative, grade 3 invasive ductal carcinoma with associated high grade DCIS of the left breast. The patient is doing well since radiotherapy. She will continue to be followed by Dr. Lindi Adie, and is scheduled to see him again next September 2018. We will follow up with her as needed as well. We reviewed skin care with Vitamin E containing lotion or oil, as well as precautions for sun exposure. She is in agreement and will keep [place] informed of any questions or concerns she has moving forward. 2. Survivorship. The patient has an appointment scheduled with [name], NP in survivorship. I have encouraged her to attend this appointment.     Carola Rhine, PAC

## 2016-08-11 ENCOUNTER — Ambulatory Visit (INDEPENDENT_AMBULATORY_CARE_PROVIDER_SITE_OTHER): Payer: BLUE CROSS/BLUE SHIELD | Admitting: Cardiology

## 2016-08-11 VITALS — BP 126/62 | HR 60 | Ht 68.0 in | Wt 220.0 lb

## 2016-08-11 DIAGNOSIS — R0989 Other specified symptoms and signs involving the circulatory and respiratory systems: Secondary | ICD-10-CM

## 2016-08-11 DIAGNOSIS — I428 Other cardiomyopathies: Secondary | ICD-10-CM

## 2016-08-11 DIAGNOSIS — R9430 Abnormal result of cardiovascular function study, unspecified: Secondary | ICD-10-CM

## 2016-08-11 DIAGNOSIS — E784 Other hyperlipidemia: Secondary | ICD-10-CM | POA: Diagnosis not present

## 2016-08-11 DIAGNOSIS — E7849 Other hyperlipidemia: Secondary | ICD-10-CM

## 2016-08-11 DIAGNOSIS — I10 Essential (primary) hypertension: Secondary | ICD-10-CM | POA: Diagnosis not present

## 2016-08-11 DIAGNOSIS — I5022 Chronic systolic (congestive) heart failure: Secondary | ICD-10-CM

## 2016-08-11 DIAGNOSIS — I420 Dilated cardiomyopathy: Secondary | ICD-10-CM

## 2016-08-11 DIAGNOSIS — Z4502 Encounter for adjustment and management of automatic implantable cardiac defibrillator: Secondary | ICD-10-CM

## 2016-08-11 MED ORDER — LISINOPRIL 5 MG PO TABS: 5.0000 mg | ORAL_TABLET | Freq: Every day | ORAL | 3 refills | Status: DC

## 2016-08-11 NOTE — Patient Instructions (Signed)
Medication Instructions:   START TAKING LISINOPRIL 5 MG ONCE DAILY   Labwork:  PRIOR TO YOUR 6 MONTH FOLLOW-UP APPT WITH DR [name] TO CHECK---CMET AND BNP    Follow-Up:  Your physician wants you to follow-up in: [place] will receive a reminder letter in the mail two months in advance. If you don't receive a letter, please call our office to schedule the follow-up appointment.  PLEASE HAVE YOUR LABS DONE PRIOR TO THIS APPOINTMENT        If you need a refill on your cardiac medications before your next appointment, please call your pharmacy.

## 2016-08-11 NOTE — Progress Notes (Signed)
Patient ID: JENNEL MARA, female   DOB: Aug 05, 1961, 55 y.o.   MRN: [id number]      Cardiology Office Note  Date:  08/11/2016   ID:  TEKISHA DARCEY, DOB 26-May-1961, MRN [id number]  PCP:  Terald Sleeper, PA-C  Cardiologist:  Dr. Ron Parker  Now Dr. [name] EP Dr. [name] Referring physician: Dr. Shann Medal, M.D. general surgeon  Chief complaint: follow up for NICM, preop evaluation for breast surgery   History of Present Illness: GETHSEMANE FISCHLER is a 55 y.o. female who presents for ICM follow up. She has a hx of nonischemic cardiomyopathy and bradycardia status post ICD implantation for primary prevention.  She has not had ischemic work up.  She has been followed by Dr. Ron Parker. 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 She denies any chest pain, shortness of breath, lower extremity edema, orthopnea or PND. She only takes Lasix as needed. She has noticed that her blood pressure has been going At her last visit to the OB/GYN it was 170. For the last 2 months she has been 8 experiencing significant discomfort for secondary to her vulvodynia, testing for infections, STDs has all been negative. She has been tried on Premarin without any significant improvement. She denies any ICD firing, palpitations or syncope. Pt was intolerant to spironolactone due to burning and she does not wish to resume. She could not aford BiDil today her BP is stable. Her PCP started her on simvastatin for elevated chol.  Pt will bring by labs at another time.  03/14/2016, the patient is coming after 3 months, her repeat echocardiogram performed in April 2017 showed decreased LVEF of 30-35%. We have switched her medication and started her on Entresto 24/26 mg po BID. the patient was also diagnosed with stage II breast cancer on 02/26/2016 and is scheduled to undergo lumpectomy in the very near future by Dr. [name]. She underwent biopsy of her left  breast that showed 2.4 cm invasive ductal cancer of the left breast , with ER 0%, PR 0%, Ki-67 59%, and HER-2 Neu pending. Her plan for chemotherapy and radiation therapy is nonconclusive at this point and depends on results of breast surgery and results of the biopsy of her axillar lymph nodes. The patient states that overall since she was started on Entresto she has more energy, denies any chest pain or dyspnea on exertion, no shortness of breath at rest no lower extremity edema no orthopnea proximal nocturnal dyspnea no palpitations or syncope. She has been compliant with her meds. She can certainly walk several blocks.  08/11/2016 - the patient underwent left lumpectomy for breast cancer of her left breast, margins negative, 0/1 lymph node, T2 N0 stage II a, ER 0%, PR 0%, HER-2 negative ratio 1.38, she refused adjuvant chemotherapy (patient fully understands that she is at very high risk of recurrence) and received adjuvant radiation from 05/03/2016 to 06/16/2016. From now on she will receive annual mammograms. Otherwise no further therapy for breast cancer.  From cardiac standpoint her LVEF has decreased and since she never had ischemic workup we have referred her to left cardiac catheterization on 08/05/2016 that showed normal coronary arteries LVEF 25-30% and normal end-diastolic pressures. The patient's didn't tolerate Entresto as she developed abdominal pain that was worsening and resolved after she discontinued. 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 She has no chest pain shortness of breath on moderate exertion occasional lower extremity edema no orthopnea or paroxysmal nocturnal dyspnea. No palpitation no recent ICD firing.   Past Medical History:  Diagnosis Date  . AICD (automatic cardioverter/defibrillator) present 09   [employer]- replaced 2016  . Arthritis   . Bradycardia   . Breast cancer ([place]) 02/24/16   Left  breast cancer  . CHF (congestive heart failure) ([place])   . Cough due to angiotensin-converting enzyme inhibitor    But patient chooses to continue ACE  . Dizziness    Dizziness with standing after squatting, March, 2012  . Dyslipidemia    LDL elevation,Patient does not want statin  . Ejection fraction < 50%   . GERD (gastroesophageal reflux disease)   . Hypothyroidism   . ICD (implantable cardiac defibrillator) battery depletion    Dr. [name], June, 2009(artifactual atrial tachycardia response events addressed by reprogramming in parentheses  . Itching   . Leg swelling    right leg  . Nonischemic cardiomyopathy ([place])    Etiology unknown, diagnosed 2008, patient has never had catheterization or nuclear study as of March, 2011  . Pain    left leg & left knee  . Stroke ([place]) 07   tia  . TIA (transient ischemic attack)    No CT or MRI abnormality, aspirin therapy  . Weight gain     Past Surgical History:  Procedure Laterality Date  . BREAST LUMPECTOMY WITH RADIOACTIVE SEED AND SENTINEL LYMPH NODE BIOPSY Left 04/05/2016   Procedure: BREAST LUMPECTOMY WITH RADIOACTIVE SEED AND SENTINEL LYMPH NODE BIOPSY;  Surgeon: [name], MD;  Location: [facility];  Service: General;  Laterality: Left;  . CARDIAC CATHETERIZATION N/A 08/05/2016   Procedure: Left Heart Cath and Coronary Angiography;  Surgeon: Peter M Martinique, MD;  Location: [facility] CV LAB;  Service: Cardiovascular;  Laterality: N/A;  . CARDIAC DEFIBRILLATOR PLACEMENT  09   [employer] no remote  . COLONOSCOPY    . CYST EXCISION Left    back of leg-lt   . IMPLANTABLE CARDIOVERTER DEFIBRILLATOR GENERATOR CHANGE N/A 12/29/2014   Procedure: IMPLANTABLE CARDIOVERTER DEFIBRILLATOR GENERATOR CHANGE;  Surgeon: [name], MD;  [facility] C[facility];  Service: Cardiovascular;  Laterality: N/A;  . LATERAL EPICONDYLE RELEASE  10/11/2012   Procedure: TENNIS ELBOW RELEASE;  Surgeon: [name], MD;  Location: [place];  Service: Orthopedics;  Laterality: Right;  RIGHT ELBOW: TENOTOMY ELBOW LATERAL EPICONDYLITIS TENNIS ELBOW: RADIAL TUNNEL RELEASE  . PORT-A-CATH REMOVAL  04/13/2016   Procedure: MINOR REMOVAL PORT-A-CATH;  Surgeon: [name], MD;  Location: [place];  Service: General;;  . PORTACATH PLACEMENT Right 04/05/2016   Procedure: INSERTION PORT-A-CATH WITH [place];  Surgeon: [name], MD;  Location: [place] [facility];  Service: General;  Laterality: Right;  right IJ  . TOTAL THYROIDECTOMY  2001  . TUBAL LIGATION  94  . UPPER GI ENDOSCOPY       Current Outpatient Prescriptions  Medication Sig Dispense Refill  . aspirin EC 81 MG tablet Take 162 mg by mouth daily.     . carvedilol (COREG) 12.5 MG tablet Take 1 tablet (12.5 mg total) by mouth 2 (two) times daily with a meal. 60 tablet 9  . levothyroxine (SYNTHROID, LEVOTHROID) 88 MCG tablet Take 88 mcg by mouth daily.     [name] lubiprostone (AMITIZA) 24 MCG capsule Take 1 capsule by mouth 2 (two) times daily.    [name]  omeprazole (PRILOSEC) 20 MG capsule Take 20 mg by mouth daily as needed (for heartburn or acid reflux).     . sacubitril-valsartan (ENTRESTO) 24-26 MG Take 1 tablet by mouth 2 (two) times daily. (Patient not taking: Reported on 08/11/2016) 180 tablet 3   No current facility-administered medications for this visit.     Allergies:   Avelox [moxifloxacin hcl in nacl]; Clindamycin/lincomycin; Kiwi extract; Bactrim [sulfamethoxazole-trimethoprim]; Levofloxacin; Moxifloxacin; Spironolactone; and Sulfa antibiotics    Social History:  The patient  reports that she has never smoked. She has never used smokeless tobacco. She reports that she does not drink alcohol or use drugs.   Family History:  The patient's family history includes Cancer in her maternal aunt and mother; Heart attack in her mother; Hypertension in her brother and sister.    ROS:  General:no colds or fevers, no weight changes Skin:no rashes or ulcers HEENT:no blurred  vision, no congestion CV:see HPI PUL:see HPI GI:no diarrhea constipation or melena, no indigestion GU:no hematuria, no dysuria MS:no joint pain, no claudication Neuro:no syncope, no lightheadedness Endo:no diabetes, + thyroid disease  Wt Readings from Last 3 Encounters:  08/11/16 220 lb (99.8 kg)  08/10/16 220 lb (99.8 kg)  08/05/16 211 lb (95.7 kg)     PHYSICAL EXAM: VS:  BP 126/62   Pulse 60   Ht '5\' 8"'  (1.727 m)   Wt 220 lb (99.8 kg)   BMI 33.45 kg/m  , BMI Body mass index is 33.45 kg/m. General:Pleasant affect, NAD Skin:Warm and dry, brisk capillary refill HEENT:normocephalic, sclera clear, mucus membranes moist Neck:supple, no JVD, no bruits  Heart:S1S2 RRR without murmur, gallup, rub or click Lungs:clear without rales, rhonchi, or wheezes OIZ:TIWP, non tender, + BS, do not palpate liver spleen or masses Ext:no lower ext edema, 2+ pedal pulses, 2+ radial pulses Neuro:alert and oriented, MAE, follows commands, + facial symmetry  EKG:  EKG is NOT ordered today.  Recent Labs: 08/02/2016: BUN 12; Creat 0.93; Hemoglobin 11.5; Platelets 176; Potassium 4.0; Sodium 144   Lipid Panel No results found for: CHOL, TRIG, HDL, CHOLHDL, VLDL, LDLCALC, LDLDIRECT   TTE: 11/2014  Impressions: - The EF on last echo 8/14 was reported as 45% but appears after my review to be worse that that at 25-30%. The EF on current exam appears to be 35-40%.  TTE: 12/08/2015  Left ventricle: The cavity size was mildly dilated. Wall  thickness was normal. Systolic function was moderately to  severely reduced. The estimated ejection fraction was in the  range of 30% to 35%. Diffuse hypokinesis. - Mitral valve: Mild prolapse, involving the anterior leaflet.  There was mild regurgitation.  Impressions: - Moderate to severe global reduction in LV systolic function;  grade 1 diastolic dysfunction; mild prolapse of anterior MV  leaflet with mild MR; mild TR. Cannot R/O apical  thrombus;  suggest fu limited study with contrast to further assess.  Cardiac catheterization:08/05/2016   There is severe left ventricular systolic dysfunction.  LV end diastolic pressure is normal.  The left ventricular ejection fraction is 25-35% by visual estimate.   1. Normal coronary anatomy 2. Severe LV dysfunction 3. Elevated LV EDP    ASSESSMENT AND PLAN:  1.  NICM most recent LVEF has worsened to 30-35% from 35-40%, [facility] II. She underwent left heart catheterization that showed normal coronaries. Unfortunately she didn't tolerate spironolactone in the past, and didn't tolerate Entresto, we will switch back to lisinopril 5 mg daily in addition to carvedilol. She appears euvolemic doesn't require  any diuretics. Her end-diastolic pressures were normal on cardiac cath last week.  2. Chronic systolic CHF - as above  3.  ICD in place since 2009 exchange in 2016, followed by Dr. [name] no ICD discharges, no QRS candidate for biventricular pacemaker.   4. Hypertension - DC and Trista, start lisinopril 5 mg daily.  Follow up in 6 months.  Signed, Ena Dawley, MD  08/11/2016 11:14 AM    [facility] [place], [address] [place], [place] Phone: [phone]; Fax: [phone]

## 2016-08-16 DIAGNOSIS — M62838 Other muscle spasm: Secondary | ICD-10-CM | POA: Diagnosis not present

## 2016-08-16 DIAGNOSIS — M9901 Segmental and somatic dysfunction of cervical region: Secondary | ICD-10-CM | POA: Diagnosis not present

## 2016-08-19 ENCOUNTER — Encounter: Payer: Self-pay | Admitting: Physician Assistant

## 2016-08-19 ENCOUNTER — Ambulatory Visit (INDEPENDENT_AMBULATORY_CARE_PROVIDER_SITE_OTHER): Payer: BLUE CROSS/BLUE SHIELD | Admitting: Physician Assistant

## 2016-08-19 VITALS — BP 134/83 | HR 62 | Temp 98.6°F | Ht 68.0 in | Wt 215.4 lb

## 2016-08-19 DIAGNOSIS — K581 Irritable bowel syndrome with constipation: Secondary | ICD-10-CM

## 2016-08-19 MED ORDER — LUBIPROSTONE 8 MCG PO CAPS: 8.0000 ug | ORAL_CAPSULE | Freq: Two times a day (BID) | ORAL | 6 refills | Status: DC

## 2016-08-19 NOTE — Patient Instructions (Signed)

## 2016-08-19 NOTE — Progress Notes (Signed)
BP (!) 143/78   Pulse 63   Temp 98.6 F (37 C) (Oral)   Ht 5\' 8"  (1.727 m)   Wt 215 lb 6.4 oz (97.7 kg)   BMI 32.75 kg/m    Subjective:    Patient ID: [name] [name], female    DOB: April 19, 1961, 55 y.o.   MRN: [id number]  HPI: [name] [name] is a 55 y.o. female presenting on 08/19/2016 for Abdominal Pain Known history of IBS Constipation.  Had not been taking her amitiza on a regular basis at all. Pain is located in the RUQ and slightly to epigastric. GERD has been under control. Reports that on amitiza 24 she was having 3 bowel movements a day and thought she could do with out it.  All meds are reviewed and refilled as needed.  Relevant past medical, surgical, family and social history reviewed and updated as indicated. Allergies and medications reviewed and updated.  Past Medical History:  Diagnosis Date  . AICD (automatic cardioverter/defibrillator) present 09   [employer]- replaced 2016  . Arthritis   . Bradycardia   . Breast cancer ([place]) 02/24/16   Left breast cancer  . CHF (congestive heart failure) ([place])   . Cough due to angiotensin-converting enzyme inhibitor    But patient chooses to continue ACE  . Dizziness    Dizziness with standing after squatting, March, 2012  . Dyslipidemia    LDL elevation,Patient does not want statin  . Ejection fraction < 50%   . GERD (gastroesophageal reflux disease)   . Hypothyroidism   . ICD (implantable cardiac defibrillator) battery depletion    Dr. [name], June, 2009(artifactual atrial tachycardia response events addressed by reprogramming in parentheses  . Itching   . Leg swelling    right leg  . Nonischemic cardiomyopathy ([place])    Etiology unknown, diagnosed 2008, patient has never had catheterization or nuclear study as of March, 2011  . Pain    left leg & left knee  . Stroke ([place]) 07   tia  . TIA (transient ischemic attack)    No CT or MRI abnormality, aspirin therapy  . Weight gain     Past Surgical History:    Procedure Laterality Date  . BREAST LUMPECTOMY WITH RADIOACTIVE SEED AND SENTINEL LYMPH NODE BIOPSY Left 04/05/2016   Procedure: BREAST LUMPECTOMY WITH RADIOACTIVE SEED AND SENTINEL LYMPH NODE BIOPSY;  Surgeon: [name], MD;  Location: [place];  Service: General;  Laterality: Left;  . CARDIAC CATHETERIZATION N/A 08/05/2016   Procedure: Left Heart Cath and Coronary Angiography;  Surgeon: Peter M Martinique, MD;  Location: [place] CV LAB;  Service: Cardiovascular;  Laterality: N/A;  . CARDIAC DEFIBRILLATOR PLACEMENT  09   [employer] no remote  . COLONOSCOPY    . CYST EXCISION Left    back of leg-lt   . IMPLANTABLE CARDIOVERTER DEFIBRILLATOR GENERATOR CHANGE N/A 12/29/2014   Procedure: IMPLANTABLE CARDIOVERTER DEFIBRILLATOR GENERATOR CHANGE;  Surgeon: [name], MD;  Location: [facility] C[facility] LAB;  Service: Cardiovascular;  Laterality: N/A;  . LATERAL EPICONDYLE RELEASE  10/11/2012   Procedure: TENNIS ELBOW RELEASE;  Surgeon: [name], MD;  Location: [place];  Service: Orthopedics;  Laterality: Right;  RIGHT ELBOW: TENOTOMY ELBOW LATERAL EPICONDYLITIS TENNIS ELBOW: RADIAL TUNNEL RELEASE  . PORT-A-CATH REMOVAL  04/13/2016   Procedure: MINOR REMOVAL PORT-A-CATH;  Surgeon: [name], MD;  Location: [place];  Service: General;;  . PORTACATH PLACEMENT Right 04/05/2016   Procedure: INSERTION PORT-A-CATH WITH  [place];  Surgeon: [name], MD;  Location: [place];  Service: General;  Laterality: Right;  right IJ  . TOTAL THYROIDECTOMY  2001  . TUBAL LIGATION  94  . UPPER GI ENDOSCOPY      Review of Systems  Constitutional: Positive for fatigue. Negative for chills, fever and unexpected weight change.  HENT: Negative.   Eyes: Negative.   Respiratory: Negative.   Cardiovascular: Negative.  Negative for chest pain, palpitations and leg swelling.  Gastrointestinal: Positive for abdominal pain and constipation. Negative for nausea and vomiting.   Genitourinary: Negative.       Medication List       Accurate as of 08/19/16 10:52 AM. Always use your most recent med list.          aspirin EC 81 MG tablet Take 162 mg by mouth daily.   carvedilol 12.5 MG tablet Commonly known as:  COREG Take 1 tablet (12.5 mg total) by mouth 2 (two) times daily with a meal.   levothyroxine 88 MCG tablet Commonly known as:  SYNTHROID, LEVOTHROID Take 88 mcg by mouth daily.   lisinopril 5 MG tablet Commonly known as:  PRINIVIL,ZESTRIL Take 1 tablet (5 mg total) by mouth daily.   lubiprostone 8 MCG capsule Commonly known as:  AMITIZA Take 1 capsule (8 mcg total) by mouth 2 (two) times daily with a meal.   omeprazole 20 MG capsule Commonly known as:  PRILOSEC Take 20 mg by mouth daily as needed (for heartburn or acid reflux).          Objective:    BP (!) 143/78   Pulse 63   Temp 98.6 F (37 C) (Oral)   Ht 5\' 8"  (1.727 m)   Wt 215 lb 6.4 oz (97.7 kg)   BMI 32.75 kg/m   Allergies  Allergen Reactions  . Avelox [Moxifloxacin Hcl In Nacl] Shortness Of Breath    Shortness of breath  . Clindamycin/Lincomycin Shortness Of Breath and Diarrhea    Breathing/diarrhea    . Kiwi Extract Anaphylaxis    Makes pt. Feel like her throat is closing   . Bactrim [Sulfamethoxazole-Trimethoprim] Swelling  . Levofloxacin Other (See Comments)    JOINT PAIN  . Moxifloxacin Other (See Comments)    Breathing problems Breathing problems  . Spironolactone Other (See Comments)    burning  . Sulfa Antibiotics Swelling    Physical Exam  Constitutional: She is oriented to person, place, and time. She appears well-developed and well-nourished.  HENT:  Head: Normocephalic and atraumatic.  Eyes: Conjunctivae and EOM are normal. Pupils are equal, round, and reactive to light.  Cardiovascular: Normal rate, regular rhythm, normal heart sounds and intact distal pulses.   Pulmonary/Chest: Effort normal and breath sounds normal.  Abdominal: Soft.  Normal appearance and bowel sounds are normal. She exhibits no distension and no mass. There is tenderness in the left upper quadrant. There is no rigidity and no guarding.  Neurological: She is alert and oriented to person, place, and time. She has normal reflexes.  Skin: Skin is warm and dry. No rash noted.  Psychiatric: She has a normal mood and affect. Her behavior is normal. Judgment and thought content normal.  Nursing note and vitals reviewed.       Assessment & Plan:   1. Irritable bowel syndrome with constipation - lubiprostone (AMITIZA) 8 MCG capsule; Take 1 capsule (8 mcg total) by mouth 2 (two) times daily with a meal.  Dispense: 60 capsule; Refill: 6   Continue all  other maintenance medications as listed above.  Follow up plan: Return if symptoms worsen or fail to improve.  Educational handout given for constipation  Terald Sleeper PA-C [facility] [address]  [place], [place] [address] [phone]   08/19/2016, 10:52 AM

## 2016-08-29 ENCOUNTER — Telehealth: Payer: Self-pay | Admitting: Genetic Counselor

## 2016-08-29 NOTE — Telephone Encounter (Signed)
Appointment rescheduled per patient request. Patient called to have 11/8 appointments rescheduled to 11/20. Next available was scheduled with request to have 11/20 appointment scheduled if there should be a cancellation.

## 2016-08-31 ENCOUNTER — Other Ambulatory Visit: Payer: BLUE CROSS/BLUE SHIELD

## 2016-08-31 ENCOUNTER — Encounter: Payer: BLUE CROSS/BLUE SHIELD | Admitting: Genetic Counselor

## 2016-09-07 DIAGNOSIS — R109 Unspecified abdominal pain: Secondary | ICD-10-CM | POA: Diagnosis not present

## 2016-09-12 ENCOUNTER — Telehealth: Payer: Self-pay | Admitting: Physician Assistant

## 2016-09-12 ENCOUNTER — Ambulatory Visit (HOSPITAL_BASED_OUTPATIENT_CLINIC_OR_DEPARTMENT_OTHER): Payer: BLUE CROSS/BLUE SHIELD | Admitting: Adult Health

## 2016-09-12 ENCOUNTER — Encounter: Payer: Self-pay | Admitting: Adult Health

## 2016-09-12 VITALS — BP 142/71 | HR 66 | Temp 98.2°F | Resp 18 | Ht 68.0 in | Wt 218.3 lb

## 2016-09-12 DIAGNOSIS — C50212 Malignant neoplasm of upper-inner quadrant of left female breast: Secondary | ICD-10-CM

## 2016-09-12 DIAGNOSIS — F4322 Adjustment disorder with anxiety: Secondary | ICD-10-CM

## 2016-09-12 DIAGNOSIS — R5383 Other fatigue: Secondary | ICD-10-CM | POA: Diagnosis not present

## 2016-09-12 DIAGNOSIS — Z171 Estrogen receptor negative status [ER-]: Secondary | ICD-10-CM | POA: Diagnosis not present

## 2016-09-12 DIAGNOSIS — N760 Acute vaginitis: Secondary | ICD-10-CM

## 2016-09-12 MED ORDER — METRONIDAZOLE 500 MG PO TABS: 500.0000 mg | ORAL_TABLET | Freq: Two times a day (BID) | ORAL | 0 refills | Status: DC

## 2016-09-12 NOTE — Telephone Encounter (Signed)
Med sent.

## 2016-09-12 NOTE — Telephone Encounter (Signed)
Does she feel like it is yeast or bacterial vaginitis (which she has had before).

## 2016-09-12 NOTE — Telephone Encounter (Signed)
Patient aware that rx sent to pharmacy. 

## 2016-09-12 NOTE — Telephone Encounter (Signed)
Patient states that she feels like it is the bacterial vaginitis.

## 2016-09-12 NOTE — Progress Notes (Signed)
CLINIC:  Survivorship   REASON FOR VISIT:  Routine follow-up post-treatment for a recent history of breast cancer.  BRIEF ONCOLOGIC HISTORY:  Oncology History         Breast cancer of upper-inner quadrant of left female breast ([place])   02/24/2016 Mammogram    Left breast mass 11:00 position 4 cm from nipple, 2.4 x 1.6 x 1.9 cm, single axillary lymph node noted, T2 N0 stage IIA clinical stage ([place])      02/24/2016 Initial Diagnosis    Moderate to poorly differentiated IDC, lymph node biopsy negative, ER 0%, PR 0%, HER-2, 2+ by IHC, fish negative, ratio 1.2, Ki-67 59%       04/05/2016 Surgery    Left lumpectomy [name]): IDC grade 3, 2.3 cm, associated high-grade DCIS, margins negative, 0/1 lymph node, T2 N0 stage II a, ER 0%, PR 0%, HER-2 negative ratio 1.38       Chemotherapy    Patient refused adjuvant chemotherapy      05/03/2016 - 06/16/2016 Radiation Therapy    Adjuvant radiation [name]). Left breast boost // 10 Gy with 5 fractions at a dose of 2 Gy/fraction 2. Left breast// 50.4 Gy with 28 fractions at a dose of 1.8 Gy/ fraction       INTERVAL HISTORY:  [name] [name] presents to the [facility] today for our initial meeting to review her survivorship care plan detailing her treatment course for breast cancer, as well as monitoring long-term side effects of that treatment, education regarding health maintenance, screening, and overall wellness and health promotion.     Overall, [name] [name] reports feeling relatively well since completing her radiation therapy approximately 3 months ago.  She continues to have fatigue; it has not gotten better since completing radiation.  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  She tells me that her job is very physically demanding; she does not exercise outside of her day-to-day  work.    She is having GI issues with constipation and was recently started on Bentyl by her GI specialist for ? GI spasms.   She endorses having quite a bit of anxiety as it relates to fear of cancer recurrence.  She also is very worried about her finances, particularly as it relates to her healthcare bills.  She tells me that she went to the [facility] near her home in [place], [place], but did not agree with their suggestions "so I decided I wouldn't ever go back there."      REVIEW OF SYSTEMS:  Review of Systems  Constitutional: Positive for fatigue.  HENT:  Negative.   Eyes: Negative.   Respiratory: Negative.   Cardiovascular: Negative.   Gastrointestinal: Positive for abdominal pain and constipation.  Endocrine: Negative.   Genitourinary: Negative.    Musculoskeletal: Negative.   Skin: Negative.   Neurological: Negative.   Hematological: Negative.   Psychiatric/Behavioral: The patient is nervous/anxious.   Breast: Denies any new nodularity, masses,nipple changes, or nipple discharge. (+) left breast tenderness/"tingling" pain.    A 14-point review of systems was completed and was negative, except as noted above.   ONCOLOGY TREATMENT TEAM:  1. Surgeon:  Dr. [name] at [facility] 2. Medical Oncologist: Dr. Lindi Adie 3. Radiation Oncologist: Dr. [name]    PAST MEDICAL/SURGICAL HISTORY:  Past Medical History:  Diagnosis Date  . AICD (automatic cardioverter/defibrillator) present 09   [employer]- replaced 2016  . Arthritis   . Bradycardia   . Breast cancer ([place]) 02/24/16   Left breast cancer  . CHF (congestive heart failure) ([place])   . Cough due to angiotensin-converting enzyme inhibitor    But patient chooses to continue ACE  . Dizziness    Dizziness with standing after squatting, March, 2012  . Dyslipidemia    LDL elevation,Patient does not want statin  . Ejection fraction < 50%   . GERD (gastroesophageal reflux disease)   .  Hypothyroidism   . ICD (implantable cardiac defibrillator) battery depletion    Dr. [name], June, 2009(artifactual atrial tachycardia response events addressed by reprogramming in parentheses  . Itching   . Leg swelling    right leg  . Nonischemic cardiomyopathy ([place])    Etiology unknown, diagnosed 2008, patient has never had catheterization or nuclear study as of March, 2011  . Pain    left leg & left knee  . Stroke ([place]) 07   tia  . TIA (transient ischemic attack)    No CT or MRI abnormality, aspirin therapy  . Weight gain    Past Surgical History:  Procedure Laterality Date  . BREAST LUMPECTOMY WITH RADIOACTIVE SEED AND SENTINEL LYMPH NODE BIOPSY Left 04/05/2016   Procedure: BREAST LUMPECTOMY WITH RADIOACTIVE SEED AND SENTINEL LYMPH NODE BIOPSY;  Surgeon: [name], MD;  Location: [facility];  Service: General;  Laterality: Left;  . CARDIAC CATHETERIZATION N/A 08/05/2016   Procedure: Left Heart Cath and Coronary Angiography;  Surgeon: Peter M Martinique, MD;  Location: [facility] CV LAB;  Service: Cardiovascular;  Laterality: N/A;  . CARDIAC DEFIBRILLATOR PLACEMENT  09   [employer] no remote  . COLONOSCOPY    . CYST EXCISION Left    back of leg-lt   . IMPLANTABLE CARDIOVERTER DEFIBRILLATOR GENERATOR CHANGE N/A 12/29/2014   Procedure: IMPLANTABLE CARDIOVERTER DEFIBRILLATOR GENERATOR CHANGE;  Surgeon: [name], MD;  Location: [facility] CATH LAB;  Service: Cardiovascular;  Laterality: N/A;  . LATERAL EPICONDYLE RELEASE  10/11/2012   Procedure: TENNIS ELBOW RELEASE;  Surgeon: [name], MD;  Location: [facility];  Service: Orthopedics;  Laterality: Right;  RIGHT ELBOW: TENOTOMY ELBOW LATERAL EPICONDYLITIS TENNIS ELBOW: RADIAL TUNNEL RELEASE  . PORT-A-CATH REMOVAL  04/13/2016   Procedure: MINOR REMOVAL PORT-A-CATH;  Surgeon: [name], MD;  Location: [place];  Service: General;;  . PORTACATH PLACEMENT Right 04/05/2016   Procedure: INSERTION  PORT-A-CATH WITH [place];  Surgeon: [name], MD;  Location: [place] [facility];  Service: General;  Laterality: Right;  right IJ  . TOTAL THYROIDECTOMY  2001  . TUBAL LIGATION  94  . UPPER GI ENDOSCOPY       ALLERGIES:  Allergies  Allergen Reactions  . Avelox [Moxifloxacin Hcl In Nacl] Shortness Of Breath    Shortness of breath  . Clindamycin/Lincomycin Shortness Of Breath and Diarrhea    Breathing/diarrhea    . Kiwi Extract Anaphylaxis    Makes pt. Feel like her throat is closing   . Bactrim [Sulfamethoxazole-Trimethoprim] Swelling  . Levofloxacin Other (See Comments)    JOINT PAIN  . Moxifloxacin Other (See Comments)    Breathing problems Breathing problems  . Spironolactone Other (See Comments)    burning  . Sulfa Antibiotics Swelling     CURRENT MEDICATIONS:  Outpatient Encounter Prescriptions as of 09/12/2016  Medication Sig  . aspirin EC 81 MG tablet Take 162 mg by  mouth daily.   . carvedilol (COREG) 12.5 MG tablet Take 1 tablet (12.5 mg total) by mouth 2 (two) times daily with a meal.  . levothyroxine (SYNTHROID, LEVOTHROID) 88 MCG tablet Take 88 mcg by mouth daily.   [name] lisinopril (PRINIVIL,ZESTRIL) 5 MG tablet Take 1 tablet (5 mg total) by mouth daily.  [name] lubiprostone (AMITIZA) 8 MCG capsule Take 1 capsule (8 mcg total) by mouth 2 (two) times daily with a meal.  . omeprazole (PRILOSEC) 20 MG capsule Take 20 mg by mouth daily as needed (for heartburn or acid reflux).    No facility-administered encounter medications on file as of 09/12/2016.      ONCOLOGIC FAMILY HISTORY:  Family History  Problem Relation Age of Onset  . Heart attack Mother   . Cancer Mother     breast  . Hypertension Sister   . Hypertension Brother   . Cancer Maternal Aunt     breast  . Stroke Neg Hx   . Diabetes Neg Hx      GENETIC COUNSELING/TESTING: Upcoming appointment for genetic counseling scheduled for 10/10/16.   SOCIAL HISTORY:  [name] [name] is single and lives alone in [place],  [place].  She has 1 son and 1 daughter.  She has 11 brothers & sisters.  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 She has been doing this work for 20+ years. She denies any current tobacco, alcohol, or illicit drug use.     PHYSICAL EXAMINATION:  Vital Signs:   Vitals:   09/12/16 1040  BP: (!) 142/71  Pulse: 66  Resp: 18  Temp: 98.2 F (36.8 C)   Filed Weights   09/12/16 1040  Weight: 218 lb 4.8 oz (99 kg)   General: Well-nourished, well-appearing female in no acute distress.  She is unaccompanied today.   HEENT: Head is normocephalic.  Pupils equal and reactive to light. Conjunctivae clear without exudate.  Sclerae anicteric. Oral mucosa is pink, moist.  Oropharynx is pink without lesions or erythema.  Lymph: No cervical, supraclavicular, or infraclavicular lymphadenopathy noted on palpation.  Cardiovascular: Regular rate and rhythm. Respiratory: Clear to auscultation bilaterally. Chest expansion symmetric; breathing non-labored.  GI: Abdomen soft and round; non-tender, non-distended. Bowel sounds normoactive.  GU: Deferred.  Neuro: No focal deficits. Steady gait.  Psych: Mood and affect normal and appropriate for situation.  Extremities: No edema. Skin: Warm and dry.  LABORATORY DATA:  None for this visit.  DIAGNOSTIC IMAGING:  None for this visit.      ASSESSMENT AND PLAN:  Ms.. [name] is a pleasant 56 y.o. female with Stage IIA left breast invasive ductal carcinoma, ER-/PR-/HER2-, diagnosed in 02/2016; initially treated with lumpectomy. She declined adjuvant chemotherapy as recommended by Dr. Lindi Adie.  She went on to complete adjuvant radiation therapy, completing treatment on 06/16/16.  She presents to the Survivorship Clinic for our initial meeting and routine follow-up post-completion of treatment for breast cancer.    1. Stage IIA left breast cancer:  [name] [name] is continuing to recover from definitive treatment for breast cancer. She is at high-risk of recurrence given  that her breast cancer was triple negative.  She previously had her mammograms done in [place] at [facility].  She would like for them to be done in [place] now. She will be due for mammogram in 01/2017 and I have placed those orders for the test to be done at the [facility].  She will follow-up with her medical oncologist, Dr. Lindi Adie, in 06/2017  with history and physical exam per surveillance protocol.  Today, a comprehensive survivorship care plan and treatment summary was reviewed with the patient today detailing her breast cancer diagnosis, treatment course, potential late/long-term effects of treatment, appropriate follow-up care with recommendations for the future, and patient education resources.  A copy of this summary, along with a letter will be sent to the patient's primary care provider via mail/fax/In Basket message after today's visit.    2. Fatigue: Fatigue as well as the most common symptoms reported by cancer survivors after completing treatment. Her fatigue is likely multifactorial, given her work schedule, recent completion of radiation therapy, and current stressors with her finances and anxiety/fear of recurrence.  We discussed that one of the best ways to combat treatment-related fatigue is to exercise. She does remain very active with her job, as it requires manual labor. We discussed the difference between being active and actually reserving time for herself for exercise. Exercise may also improve her mood. We discussed the [employer] fitness program, which is designed for cancer survivors to help them become more physically fit after cancer treatments.  She could participate in [employer] at the [facility] in [place], which was recently added as a location for patients.   3. Adjustment disorder with anxious & depressed mood: It is not uncommon for this period of the patient's cancer care trajectory to be one of many emotions and stressors. Currently, [name] [name]'s  biggest stressors are her finances and fear of cancer recurrence.   We discussed an opportunity for her to participate in the next session of [facility] ("Finding Your New Normal") support group series designed for patients after they have completed treatment.  It is difficult for her to participate in programs consistently due to her shift work schedule.  [name] [name] was encouraged to take advantage of our many other support services programs, support groups, and/or counseling in coping with her new life as a cancer survivor after completing anti-cancer treatment.  She was offered support today through active listening and expressive supportive counseling.  She was given information regarding our available services and encouraged to contact me with any questions or for help enrolling in any of our support group/programs.  She has tried the [facility] in [place], but did not the experience she expected there.  I encouraged her to reconsider some of the other programs that [facility] offers, as it may be helpful for her to be around other survivors for further validation and healing.  She did agree to have me reach out to our oncology social workers to have them give the her a call with other suggestions for the possibility of other resources that may be available for her.    4. Bone health:  Given [name] [name]'s age & history of breast cancer, she is at risk for bone demineralization.  I do not have records available for review of any DEXA scan was done in the past. Given that she will not require antiestrogen therapy, I will defer any future bone mineral density testing to her PCP, as clinically indicated.   5. Cancer screening:  Due to [name] [name]'s history and her age, she should receive screening for skin cancers, colon cancer, and gynecologic cancers.  The information and recommendations are listed on the patient's comprehensive care plan/treatment summary and were reviewed in detail with the patient.     6. Health maintenance and wellness promotion: [name] [name] was encouraged to consume 5-7 servings of fruits and vegetables per  day. We reviewed the "Nutrition Rainbow" handout, as well as the handout "Take Control of Your Health and Reduce Your Cancer Risk" from the [employer].  She was also encouraged to engage in moderate to vigorous exercise for 30 minutes per day most days of the week.  She was instructed to limit her alcohol consumption and continue to abstain from tobacco use.      Dispo:   -Mammogram due in 01/2017; orders placed today to have mammogram done at [facility].  -Return to cancer center to see Dr. Lindi Adie in 06/2017. -She is welcome to return back to the Survivorship Clinic at any time; no additional follow-up needed at this time.  -Consider referral back to survivorship as a long-term survivor for continued surveillance   A total of 60 minutes of face-to-face time was spent with this patient with greater than 50% of that time in counseling and care-coordination.   [name], NP Survivorship Program [place] [phone]   Note: PRIMARY CARE PROVIDER Terald Sleeper, [place] [phone]

## 2016-09-13 ENCOUNTER — Encounter: Payer: Self-pay | Admitting: Internal Medicine

## 2016-09-13 DIAGNOSIS — M9901 Segmental and somatic dysfunction of cervical region: Secondary | ICD-10-CM | POA: Diagnosis not present

## 2016-09-13 DIAGNOSIS — M62838 Other muscle spasm: Secondary | ICD-10-CM | POA: Diagnosis not present

## 2016-09-13 DIAGNOSIS — Z114 Encounter for screening for human immunodeficiency virus [HIV]: Secondary | ICD-10-CM | POA: Diagnosis not present

## 2016-09-13 DIAGNOSIS — Z113 Encounter for screening for infections with a predominantly sexual mode of transmission: Secondary | ICD-10-CM | POA: Diagnosis not present

## 2016-09-13 DIAGNOSIS — L304 Erythema intertrigo: Secondary | ICD-10-CM | POA: Diagnosis not present

## 2016-09-14 ENCOUNTER — Telehealth: Payer: Self-pay | Admitting: Adult Health

## 2016-09-14 NOTE — Telephone Encounter (Signed)
I received a call from [name] [name] with questions regarding the safety of using Premarin vaginal cream with her history of breast cancer.   She tells me that she had received a prescription for it prior to her cancer diagnosis and it was helpful in treating her atrophic vaginitis.  She thinks it has "flared up again" and recently saw her OB-GYN, who recommended that she reach out to [place] about using the Premarin cream.   I let [name] [name] know that we generally try to avoid vaginal estrogen creams in all women with history of breast cancer, when we are able.  We will recommend patients try more conservative measures first, to help address the vaginal dryness that is likely contributing to the atrophic vaginitis.  I recommended that she try coconut oil as a vaginal moisturizer or Replens OTC.  I gave her instructions on how to use both.  I also recommended that she use either the coconut oil or a traditional lubricant (like Astroglide, etc) for intercourse.     Since her breast cancer was triple negative, using vaginal estrogen creams may be safer for her than for survivors who had ER+ disease, however there is not much data to support this.  Therefore, I encouraged her to try the conservative measures first and if they were unsuccessful, then her gynecologist is welcome to reach out to [place] for further suggestions, as needed.    Encouraged her to call with any other questions or concerns.   [name], NP [place] [phone]

## 2016-09-16 ENCOUNTER — Encounter: Payer: Self-pay | Admitting: *Deleted

## 2016-09-16 NOTE — Progress Notes (Signed)
[place] Clinical Social Work  Clinical Social Work was referred by [name]  for assessment of psychosocial needs due to Survivorship concerns.  Clinical Social Worker contacted patient at home to offer support and assess for needs.  CSW left supportive message explaining role of CSW team and possible avenues for support. CSW encouraged pt to return call as needed.   Clinical Social Work interventions:  Check in  [name] [name], [place] Social Worker [place]  [place] Phone: [phone] Fax: [phone]

## 2016-09-21 ENCOUNTER — Encounter: Payer: Self-pay | Admitting: *Deleted

## 2016-09-21 NOTE — Progress Notes (Signed)
[facility] [place] Work  Clinical Social Work returned pt's phone call and left brief, but informative message for pt with possible resource options and their contact information for her to follow up. CSW available for additional support as needed.    Clinical Social Work interventions: Resource education and referral  Loren Racer, [place] Worker [place]  [place] Phone: [phone] Fax: [phone]

## 2016-09-23 DIAGNOSIS — L0291 Cutaneous abscess, unspecified: Secondary | ICD-10-CM | POA: Diagnosis not present

## 2016-09-28 DIAGNOSIS — N952 Postmenopausal atrophic vaginitis: Secondary | ICD-10-CM | POA: Diagnosis not present

## 2016-09-28 DIAGNOSIS — N898 Other specified noninflammatory disorders of vagina: Secondary | ICD-10-CM | POA: Diagnosis not present

## 2016-10-02 ENCOUNTER — Other Ambulatory Visit: Payer: Self-pay | Admitting: Cardiology

## 2016-10-02 DIAGNOSIS — I429 Cardiomyopathy, unspecified: Secondary | ICD-10-CM

## 2016-10-10 ENCOUNTER — Other Ambulatory Visit: Payer: BLUE CROSS/BLUE SHIELD

## 2016-10-10 ENCOUNTER — Ambulatory Visit (INDEPENDENT_AMBULATORY_CARE_PROVIDER_SITE_OTHER): Payer: BLUE CROSS/BLUE SHIELD | Admitting: Physician Assistant

## 2016-10-10 ENCOUNTER — Ambulatory Visit (HOSPITAL_BASED_OUTPATIENT_CLINIC_OR_DEPARTMENT_OTHER): Payer: BLUE CROSS/BLUE SHIELD | Admitting: Genetic Counselor

## 2016-10-10 ENCOUNTER — Encounter: Payer: Self-pay | Admitting: Genetic Counselor

## 2016-10-10 ENCOUNTER — Encounter: Payer: Self-pay | Admitting: Physician Assistant

## 2016-10-10 VITALS — BP 123/82 | HR 64 | Temp 98.6°F | Ht 68.0 in | Wt 212.0 lb

## 2016-10-10 DIAGNOSIS — Z7183 Encounter for nonprocreative genetic counseling: Secondary | ICD-10-CM | POA: Diagnosis not present

## 2016-10-10 DIAGNOSIS — H6121 Impacted cerumen, right ear: Secondary | ICD-10-CM

## 2016-10-10 DIAGNOSIS — K589 Irritable bowel syndrome without diarrhea: Secondary | ICD-10-CM

## 2016-10-10 DIAGNOSIS — Z803 Family history of malignant neoplasm of breast: Secondary | ICD-10-CM | POA: Diagnosis not present

## 2016-10-10 DIAGNOSIS — C50212 Malignant neoplasm of upper-inner quadrant of left female breast: Secondary | ICD-10-CM | POA: Diagnosis not present

## 2016-10-10 DIAGNOSIS — Z171 Estrogen receptor negative status [ER-]: Secondary | ICD-10-CM | POA: Diagnosis not present

## 2016-10-10 NOTE — Progress Notes (Signed)
REFERRING PROVIDER: Terald Sleeper, PA-C [place], [place] [address]  Nicholas Lose, MD  PRIMARY PROVIDER:  Terald Sleeper, PA-C  PRIMARY REASON FOR VISIT:  1. Malignant neoplasm of upper-inner quadrant of left breast in female, estrogen receptor negative ([place])   2. Family history of breast cancer      HISTORY OF PRESENT ILLNESS:   [name] [name], a 55 y.o. female, was seen for a [facility] cancer genetics consultation at the request of Dr. Lindi Adie due to a personal and family history of cancer.  [name] [name] presents to clinic today to discuss the possibility of a hereditary predisposition to cancer, genetic testing, and to further clarify her future cancer risks, as well as potential cancer risks for family members.   In May 2017, at the age of 4, [name] [name] was diagnosed with invasive ductal carcinoma of the left breast. The tumor is triple negative This was treated with lumpectomy and radiation.  The patient states that she refused chemotherapy.     CANCER HISTORY:  Oncology History         Breast cancer of upper-inner quadrant of left female breast ([facility])   02/24/2016 Mammogram    Left breast mass 11:00 position 4 cm from nipple, 2.4 x 1.6 x 1.9 cm, single axillary lymph node noted, T2 N0 stage IIA clinical stage ([facility])      02/24/2016 Initial Diagnosis    Moderate to poorly differentiated IDC, lymph node biopsy negative, ER 0%, PR 0%, HER-2, 2+ by IHC, fish negative, ratio 1.2, Ki-67 59%       04/05/2016 Surgery    Left lumpectomy [name] [name]): IDC grade 3, 2.3 cm, associated high-grade DCIS, margins negative, 0/1 lymph node, T2 N0 stage II a, ER 0%, PR 0%, HER-2 negative ratio 1.38       Chemotherapy    Patient refused adjuvant chemotherapy      05/03/2016 - 06/16/2016 Radiation Therapy    Adjuvant radiation [name] [name]). Left breast boost // 10 Gy with 5 fractions at a dose of 2 Gy/fraction.  Left breast// 50.4 Gy with 28 fractions at a dose of 1.8 Gy/ fraction        HORMONAL  RISK FACTORS:  Menarche was at age 82.  First live birth at age 18.  OCP use for approximately 5-10 years.  Ovaries intact: yes.  Hysterectomy: no.  Menopausal status: postmenopausal.  HRT use: 0 years. Colonoscopy: yes; normal. Mammogram within the last year: yes. Number of breast biopsies: 2. Up to date with pelvic exams:  yes. Any excessive radiation exposure in the past:  no  Past Medical History:  Diagnosis Date  . AICD (automatic cardioverter/defibrillator) present 09   [employer]- replaced 2016  . Arthritis   . Bradycardia   . Breast cancer ([facility]) 02/24/16   Left breast cancer  . CHF (congestive heart failure) ([facility])   . Cough due to angiotensin-converting enzyme inhibitor    But patient chooses to continue ACE  . Dizziness    Dizziness with standing after squatting, March, 2012  . Dyslipidemia    LDL elevation,Patient does not want statin  . Ejection fraction < 50%   . Family history of breast cancer   . GERD (gastroesophageal reflux disease)   . Hypothyroidism   . ICD (implantable cardiac defibrillator) battery depletion    Dr. [name], June, 2009(artifactual atrial tachycardia response events addressed by reprogramming in parentheses  . Itching   . Leg swelling    right leg  .  Nonischemic cardiomyopathy ([facility])    Etiology unknown, diagnosed 2008, patient has never had catheterization or nuclear study as of March, 2011  . Pain    left leg & left knee  . Stroke ([facility]) 07   tia  . TIA (transient ischemic attack)    No CT or MRI abnormality, aspirin therapy  . Weight gain     Past Surgical History:  Procedure Laterality Date  . BREAST LUMPECTOMY WITH RADIOACTIVE SEED AND SENTINEL LYMPH NODE BIOPSY Left 04/05/2016   Procedure: BREAST LUMPECTOMY WITH RADIOACTIVE SEED AND SENTINEL LYMPH NODE BIOPSY;  Surgeon: [name], MD;  Location: [facility];  Service: General;  Laterality: Left;  . CARDIAC CATHETERIZATION N/A 08/05/2016   Procedure: Left Heart Cath and  Coronary Angiography;  Surgeon: Peter M Martinique, MD;  Location: [facility];  Service: Cardiovascular;  Laterality: N/A;  . CARDIAC DEFIBRILLATOR PLACEMENT  09   [employer] no remote  . COLONOSCOPY    . CYST EXCISION Left    back of leg-lt   . IMPLANTABLE CARDIOVERTER DEFIBRILLATOR GENERATOR CHANGE N/A 12/29/2014   Procedure: IMPLANTABLE CARDIOVERTER DEFIBRILLATOR GENERATOR CHANGE;  Surgeon: [name], MD;  Location[facility] CATH LAB;  Service: Cardiovascular;  Laterality: N/A;  . LATERAL EPICONDYLE RELEASE  10/11/2012   Procedure: TENNIS ELBOW RELEASE;  Surgeon: [name], MD;  Location: [facility];  Service: Orthopedics;  Laterality: Right;  RIGHT ELBOW: TENOTOMY ELBOW LATERAL EPICONDYLITIS TENNIS ELBOW: RADIAL TUNNEL RELEASE  . PORT-A-CATH REMOVAL  04/13/2016   Procedure: MINOR REMOVAL PORT-A-CATH;  Surgeon: [name], MD;  Location: [facility];  Service: General;;  . PORTACATH PLACEMENT Right 04/05/2016   Procedure: INSERTION PORT-A-CATH WITH [place];  Surgeon: [name], MD;  Location: [facility];  Service: General;  Laterality: Right;  right IJ  . TOTAL THYROIDECTOMY  2001  . TUBAL LIGATION  94  . UPPER GI ENDOSCOPY      Social History   Social History  . Marital status: Single    Spouse name: N/A  . Number of children: N/A  . Years of education: N/A   Occupational History  . Widow    Social History Main Topics  . Smoking status: Never Smoker  . Smokeless tobacco: Never Used  . Alcohol use No  . Drug use: No  . Sexual activity: Not Asked   Other Topics Concern  . None   Social History Narrative  . None     FAMILY HISTORY:  We obtained a detailed, 4-generation family history.  Significant diagnoses are listed below: Family History  Problem Relation Age of Onset  . Heart attack Mother   . Cancer Mother     breast  . Hypertension Sister   . Hypertension Brother   . Cancer Maternal Aunt     breast  . Cancer Paternal  Grandmother     possible cancer, unknown type  . Stroke Neg Hx   . Diabetes Neg Hx     The patient has a son and daughter, five brothers and six sisters - none of whom have cancer.  Her mother was diagnosed with breast cancer at 35 and she is now 57.  Her mother had two brothers and a sister.  The sister was diagnosed with breast cancer over the age of 85.  There is no other reported family history of cancer on the maternal side.  The patient's father is deceased.  He had a sister and brother who are cancer free.  His mother may have had cancer, but patient is not sure.  There is no other reported family history of cancer.  [name] [name] is unaware of previous family history of genetic testing for hereditary cancer risks. Patient's maternal ancestors are of African American descent, and paternal ancestors are of African American descent. There is no reported Ashkenazi Jewish ancestry. There is no known consanguinity.  GENETIC COUNSELING ASSESSMENT: [name] [name] is a 55 y.o. female with a personal and family history of breast cancer which is somewhat suggestive of a hereditary cancer syndrome and predisposition to cancer. We, therefore, discussed and recommended the following at today's visit.   DISCUSSION: We discussed that about 5-10% of breast cancer is due to hereditary causes with most cases due to BRCA mutations.  Other genes associated with hereditary cancer syndromes include ATM, CHEK2, and PALB2.  We reviewed the characteristics, features and inheritance patterns of hereditary cancer syndromes. We also discussed genetic testing, including the appropriate family members to test, the process of testing, insurance coverage and turn-around-time for results. We discussed the implications of a negative, positive and/or variant of uncertain significant result. We recommended [name] [name] pursue genetic testing for the Breast/Ovarian cancer gene panel.   Based on [name] [name]'s personal and family history of  cancer, she meets medical criteria for genetic testing. Despite that she meets criteria, she may still have an out of pocket cost. We discussed that if her out of pocket cost for testing is over $100, the laboratory will call and confirm whether she wants to proceed with testing.  If the out of pocket cost of testing is less than $100 she will be billed by the genetic testing laboratory.   PLAN: After considering the risks, benefits, and limitations, [name] [name]  provided informed consent to pursue genetic testing and the blood sample was sent to [facility] for analysis of the Breast/Ovarian cancer panel. Results should be available within approximately 2-3 weeks' time, at which point they will be disclosed by telephone to [name] [name], as will any additional recommendations warranted by these results. [name] [name] will receive a summary of her genetic counseling visit and a copy of her results once available. This information will also be available in Epic. We encouraged [name] [name] to remain in contact with cancer genetics annually so that we can continuously update the family history and inform her of any changes in cancer genetics and testing that may be of benefit for her family. [name] [name] questions were answered to her satisfaction today. Our contact information was provided should additional questions or concerns arise.  Lastly, we encouraged [name] [name] to remain in contact with cancer genetics annually so that we can continuously update the family history and inform her of any changes in cancer genetics and testing that may be of benefit for this family.   Ms.  [name] questions were answered to her satisfaction today. Our contact information was provided should additional questions or concerns arise. Thank you for the referral and allowing [place] to share in the care of your patient.   [name] [name] Kiesling P. [place], [place], [place] [facility] Certified Genetic Counselor [email or web address] phone: [phone]  The patient was  seen for a total of 45 minutes in face-to-face genetic counseling.  This patient was discussed with Drs. Magrinat, Lindi Adie and/or Burr Medico who agrees with the above.    _______________________________________________________________________ For Office Staff:  Number of people involved in session: 1 Was an Intern/ student involved with case: no

## 2016-10-10 NOTE — Progress Notes (Signed)
BP 123/82   Pulse 64   Temp 98.6 F (37 C) (Oral)   Ht 5\' 8"  (1.727 m)   Wt 212 lb (96.2 kg)   BMI 32.23 kg/m    Subjective:    Patient ID: [name] [name], female    DOB: 05-23-1961, 55 y.o.   MRN: [id number]  HPI: [name] [name] is a 55 y.o. female presenting on 10/10/2016 for Abdominal Pain and Ear Pain (stopped up )  She has been having right ear fullness and pain over the past week.  Wears earplugs at work. They have stopped up and she has lost hearing on this side. She has never had impacted cerumen before.  She has also had increased constipation after taking the dicyclomine vomiting her gastroenterologist. She has had pain up into the left upper quadrant. She has held the medicine to see if it will help. Continue her  amitiza.  Past Medical History:  Diagnosis Date  . AICD (automatic cardioverter/defibrillator) present 09   [employer]- replaced 2016  . Arthritis   . Bradycardia   . Breast cancer ([place]) 02/24/16   Left breast cancer  . CHF (congestive heart failure) ([place])   . Cough due to angiotensin-converting enzyme inhibitor    But patient chooses to continue ACE  . Dizziness    Dizziness with standing after squatting, March, 2012  . Dyslipidemia    LDL elevation,Patient does not want statin  . Ejection fraction < 50%   . Family history of breast cancer   . GERD (gastroesophageal reflux disease)   . Hypothyroidism   . ICD (implantable cardiac defibrillator) battery depletion    Dr. [name], June, 2009(artifactual atrial tachycardia response events addressed by reprogramming in parentheses  . Itching   . Leg swelling    right leg  . Nonischemic cardiomyopathy ([place])    Etiology unknown, diagnosed 2008, patient has never had catheterization or nuclear study as of March, 2011  . Pain    left leg & left knee  . Stroke ([place]) 07   tia  . TIA (transient ischemic attack)    No CT or MRI abnormality, aspirin therapy  . Weight gain    Relevant past medical,  surgical, family and social history reviewed and updated as indicated. Interim medical history since our last visit reviewed. Allergies and medications reviewed and updated. DATA REVIEWED: CHART IN EPIC  Social History   Social History  . Marital status: Single    Spouse name: N/A  . Number of children: N/A  . Years of education: N/A   Occupational History  . Widow    Social History Main Topics  . Smoking status: Never Smoker  . Smokeless tobacco: Never Used  . Alcohol use No  . Drug use: No  . Sexual activity: Not on file   Other Topics Concern  . Not on file   Social History Narrative  . No narrative on file    Past Surgical History:  Procedure Laterality Date  . BREAST LUMPECTOMY WITH RADIOACTIVE SEED AND SENTINEL LYMPH NODE BIOPSY Left 04/05/2016   Procedure: BREAST LUMPECTOMY WITH RADIOACTIVE SEED AND SENTINEL LYMPH NODE BIOPSY;  Surgeon: [name], MD;  Location: [facility];  Service: General;  Laterality: Left;  . CARDIAC CATHETERIZATION N/A 08/05/2016   Procedure: Left Heart Cath and Coronary Angiography;  Surgeon: Peter M Martinique, MD;  Location: [facility];  Service: Cardiovascular;  Laterality: N/A;  . CARDIAC DEFIBRILLATOR PLACEMENT  09   [employer] no remote  .  COLONOSCOPY    . CYST EXCISION Left    back of leg-lt   . IMPLANTABLE CARDIOVERTER DEFIBRILLATOR GENERATOR CHANGE N/A 12/29/2014   Procedure: IMPLANTABLE CARDIOVERTER DEFIBRILLATOR GENERATOR CHANGE;  Surgeon: [name], MD;  Location: [facility] CATH LAB;  Service: Cardiovascular;  Laterality: N/A;  . LATERAL EPICONDYLE RELEASE  10/11/2012   Procedure: TENNIS ELBOW RELEASE;  Surgeon: [name], MD;  Location: [facility];  Service: Orthopedics;  Laterality: Right;  RIGHT ELBOW: TENOTOMY ELBOW LATERAL EPICONDYLITIS TENNIS ELBOW: RADIAL TUNNEL RELEASE  . PORT-A-CATH REMOVAL  04/13/2016   Procedure: MINOR REMOVAL PORT-A-CATH;  Surgeon: [name], MD;  Location: [facility];  Service: General;;  . PORTACATH PLACEMENT Right 04/05/2016   Procedure: INSERTION PORT-A-CATH WITH [place];  Surgeon: [name], MD;  Location: [facility];  Service: General;  Laterality: Right;  right IJ  . TOTAL THYROIDECTOMY  2001  . TUBAL LIGATION  94  . UPPER GI ENDOSCOPY      Family History  Problem Relation Age of Onset  . Heart attack Mother   . Cancer Mother     breast  . Hypertension Sister   . Hypertension Brother   . Cancer Maternal Aunt     breast  . Cancer Paternal Grandmother     possible cancer, unknown type  . Stroke Neg Hx   . Diabetes Neg Hx     Review of Systems  Constitutional: Negative.  Negative for activity change, fatigue and fever.  HENT: Positive for ear pain. Negative for ear discharge.   Eyes: Negative.   Respiratory: Negative.  Negative for cough.   Cardiovascular: Negative.  Negative for chest pain.  Gastrointestinal: Negative.  Negative for abdominal distention, abdominal pain and anal bleeding.  Endocrine: Negative.   Genitourinary: Negative.  Negative for dysuria.  Musculoskeletal: Negative.   Skin: Negative.   Neurological: Negative.     Allergies as of 10/10/2016      Reactions   Avelox [moxifloxacin Hcl In Nacl] Shortness Of Breath   Shortness of breath   Clindamycin/lincomycin Shortness Of Breath, Diarrhea   Breathing/diarrhea   Kiwi Extract Anaphylaxis   Makes pt. Feel like her throat is closing   Bactrim [sulfamethoxazole-trimethoprim] Swelling   Levofloxacin Other (See Comments)   JOINT PAIN JOINT PAIN   Moxifloxacin Other (See Comments)   Breathing problems Breathing problems   Spironolactone Other (See Comments)   burning   Sulfa Antibiotics Swelling      Medication List       Accurate as of 10/10/16  4:29 PM. Always use your most recent med list.          aspirin EC 81 MG tablet Take 162 mg by mouth daily.   carvedilol 12.5 MG tablet Commonly known as:  COREG TAKE ONE TABLET BY MOUTH TWICE DAILY  WITH A MEAL   dicyclomine 10 MG capsule Commonly known as:  BENTYL Take 10 mg by mouth as needed.   levothyroxine 88 MCG tablet Commonly known as:  SYNTHROID, LEVOTHROID Take 88 mcg by mouth daily.   lisinopril 5 MG tablet Commonly known as:  PRINIVIL,ZESTRIL Take 1 tablet (5 mg total) by mouth daily.   lubiprostone 8 MCG capsule Commonly known as:  AMITIZA Take 1 capsule (8 mcg total) by mouth 2 (two) times daily with a meal.   multivitamin tablet Take 1 tablet by mouth daily.   omeprazole 20 MG capsule Commonly known as:  PRILOSEC Take 20 mg by mouth daily as needed (  for heartburn or acid reflux).          Objective:    BP 123/82   Pulse 64   Temp 98.6 F (37 C) (Oral)   Ht 5\' 8"  (1.727 m)   Wt 212 lb (96.2 kg)   BMI 32.23 kg/m   Allergies  Allergen Reactions  . Avelox [Moxifloxacin Hcl In Nacl] Shortness Of Breath    Shortness of breath  . Clindamycin/Lincomycin Shortness Of Breath and Diarrhea    Breathing/diarrhea    . Kiwi Extract Anaphylaxis    Makes pt. Feel like her throat is closing   . Bactrim [Sulfamethoxazole-Trimethoprim] Swelling  . Levofloxacin Other (See Comments)    JOINT PAIN JOINT PAIN  . Moxifloxacin Other (See Comments)    Breathing problems Breathing problems  . Spironolactone Other (See Comments)    burning  . Sulfa Antibiotics Swelling    Wt Readings from Last 3 Encounters:  10/10/16 212 lb (96.2 kg)  09/12/16 218 lb 4.8 oz (99 kg)  08/19/16 215 lb 6.4 oz (97.7 kg)    Physical Exam  Constitutional: She is oriented to person, place, and time. She appears well-developed and well-nourished.  HENT:  Head: Normocephalic and atraumatic.  Right Ear: Tympanic membrane, external ear and ear canal normal.  Left Ear: Tympanic membrane, external ear and ear canal normal.  Nose: Nose normal. No rhinorrhea.  Mouth/Throat: Oropharynx is clear and moist and mucous membranes are normal. No oropharyngeal exudate or posterior  oropharyngeal erythema.  Eyes: Conjunctivae and EOM are normal. Pupils are equal, round, and reactive to light.  Neck: Normal range of motion. Neck supple.  Cardiovascular: Normal rate, regular rhythm, normal heart sounds and intact distal pulses.   Pulmonary/Chest: Effort normal and breath sounds normal.  Abdominal: Soft. Bowel sounds are normal.  Neurological: She is alert and oriented to person, place, and time. She has normal reflexes.  Skin: Skin is warm and dry. No rash noted.  Psychiatric: She has a normal mood and affect. Her behavior is normal. Judgment and thought content normal.        Assessment & Plan:   1. Irritable bowel syndrome, unspecified type  2. Impacted cerumen of right ear Ear lavage to bright EAC performed by clinical nurse. Patient tolerated well.  Successful lavage.  Continue all other maintenance medications as listed above.  Follow up plan: Return if symptoms worsen or fail to improve.  Educational handout given for **cerumen impaction*  Terald Sleeper PA-C [facility] [address]  [place], [place] [address] [phone]   10/10/2016, 4:29 PM

## 2016-10-10 NOTE — Patient Instructions (Signed)
Earwax Buildup Your ears make a substance called earwax. It may also be called cerumen. Sometimes, too much earwax builds up in your ear canal. This can cause ear pain and make it harder for you to hear. CAUSES This condition is caused by too much earwax production or buildup. RISK FACTORS The following factors may make you more likely to develop this condition:  Cleaning your ears often with swabs.  Having narrow ear canals.  Having earwax that is overly thick or sticky.  Having eczema.  Being dehydrated. SYMPTOMS Symptoms of this condition include:  Reduced hearing.  Ear drainage.  Ear pain.  Ear itch.  A feeling of fullness in the ear or feeling that the ear is plugged.  Ringing in the ear.  Coughing. DIAGNOSIS Your health care provider can diagnose this condition based on your symptoms and medical history. Your health care provider will also do an ear exam to look inside your ear with a scope (otoscope). You may also have a hearing test. TREATMENT Treatment for this condition includes:  Over-the-counter or prescription ear drops to soften the earwax.  Earwax removal by a health care provider. This may be done:  By flushing the ear with body-temperature water.  With a medical instrument that has a loop at the end (earwax curette).  With a suction device. HOME CARE INSTRUCTIONS  Take over-the-counter and prescription medicines only as told by your health care provider.  Do not put any objects, including an ear swab, into your ear. You can clean the opening of your ear canal with a washcloth.  Drink enough water to keep your urine clear or pale yellow.  If you have frequent earwax buildup or you use hearing aids, consider seeing your health care provider every 6-12 months for routine preventive ear cleanings. Keep all follow-up visits as told by your health care provider. SEEK MEDICAL CARE IF:  You have ear pain.  Your condition does not improve with  treatment.  You have hearing loss.  You have blood, pus, or other fluid coming from your ear. This information is not intended to replace advice given to you by your health care provider. Make sure you discuss any questions you have with your health care provider. Document Released: 11/17/2004 Document Revised: 02/01/2016 Document Reviewed: 05/27/2015 Elsevier Interactive Patient Education  2017 Elsevier Inc.  

## 2016-10-11 DIAGNOSIS — M62838 Other muscle spasm: Secondary | ICD-10-CM | POA: Diagnosis not present

## 2016-10-11 DIAGNOSIS — M9903 Segmental and somatic dysfunction of lumbar region: Secondary | ICD-10-CM | POA: Diagnosis not present

## 2016-10-14 DIAGNOSIS — Z803 Family history of malignant neoplasm of breast: Secondary | ICD-10-CM | POA: Diagnosis not present

## 2016-10-14 DIAGNOSIS — Z853 Personal history of malignant neoplasm of breast: Secondary | ICD-10-CM | POA: Diagnosis not present

## 2016-10-14 DIAGNOSIS — C50212 Malignant neoplasm of upper-inner quadrant of left female breast: Secondary | ICD-10-CM | POA: Diagnosis not present

## 2016-10-14 DIAGNOSIS — Z171 Estrogen receptor negative status [ER-]: Secondary | ICD-10-CM | POA: Diagnosis not present

## 2016-10-19 ENCOUNTER — Ambulatory Visit (INDEPENDENT_AMBULATORY_CARE_PROVIDER_SITE_OTHER): Payer: BLUE CROSS/BLUE SHIELD | Admitting: *Deleted

## 2016-10-19 DIAGNOSIS — I428 Other cardiomyopathies: Secondary | ICD-10-CM | POA: Diagnosis not present

## 2016-10-20 NOTE — Progress Notes (Signed)
Remote ICD transmission.   

## 2016-10-21 ENCOUNTER — Encounter: Payer: Self-pay | Admitting: Cardiology

## 2016-10-21 LAB — CUP PACEART REMOTE DEVICE CHECK
Battery Remaining Longevity: 126 mo
Date Time Interrogation Session: 20171227050200
HighPow Impedance: 52 Ohm
Implantable Lead Implant Date: 20090320
Implantable Lead Location: 753860
Implantable Lead Serial Number: 182504
Implantable Pulse Generator Implant Date: 20160307
Lead Channel Pacing Threshold Pulse Width: 0.4 ms
Lead Channel Pacing Threshold Pulse Width: 0.4 ms
Lead Channel Setting Pacing Amplitude: 2 V
Lead Channel Setting Pacing Amplitude: 2.5 V
Lead Channel Setting Pacing Pulse Width: 0.4 ms
Lead Channel Setting Sensing Sensitivity: 0.6 mV
MDC IDC LEAD IMPLANT DT: 20090320
MDC IDC LEAD LOCATION: 753859
MDC IDC LEAD MODEL: 158
MDC IDC MSMT BATTERY REMAINING PERCENTAGE: 100 %
MDC IDC MSMT LEADCHNL RA IMPEDANCE VALUE: 516 Ohm
MDC IDC MSMT LEADCHNL RA PACING THRESHOLD AMPLITUDE: 0.4 V
MDC IDC MSMT LEADCHNL RV IMPEDANCE VALUE: 437 Ohm
MDC IDC MSMT LEADCHNL RV PACING THRESHOLD AMPLITUDE: 0.8 V
MDC IDC PG SERIAL: 111826
MDC IDC STAT BRADY RA PERCENT PACED: 0 %
MDC IDC STAT BRADY RV PERCENT PACED: 0 %

## 2016-11-03 ENCOUNTER — Telehealth: Payer: Self-pay | Admitting: Genetic Counselor

## 2016-11-03 NOTE — Telephone Encounter (Signed)
LM on VM with good news.  Asked that she call back. 

## 2016-11-03 NOTE — Telephone Encounter (Signed)
Revealed negative genetic testing. Explained that we did find an ATM VUS.  Discussed that we treat this as a negative test and we will be in touch in the future when this is reclassified.

## 2016-11-04 ENCOUNTER — Encounter: Payer: Self-pay | Admitting: Cardiology

## 2016-11-08 DIAGNOSIS — M62838 Other muscle spasm: Secondary | ICD-10-CM | POA: Diagnosis not present

## 2016-11-08 DIAGNOSIS — M9903 Segmental and somatic dysfunction of lumbar region: Secondary | ICD-10-CM | POA: Diagnosis not present

## 2016-11-15 DIAGNOSIS — R197 Diarrhea, unspecified: Secondary | ICD-10-CM | POA: Diagnosis not present

## 2016-11-15 DIAGNOSIS — A09 Infectious gastroenteritis and colitis, unspecified: Secondary | ICD-10-CM | POA: Diagnosis not present

## 2016-11-18 ENCOUNTER — Other Ambulatory Visit: Payer: Self-pay | Admitting: Internal Medicine

## 2016-11-22 ENCOUNTER — Ambulatory Visit: Payer: Self-pay | Admitting: Genetic Counselor

## 2016-11-22 DIAGNOSIS — Z1379 Encounter for other screening for genetic and chromosomal anomalies: Secondary | ICD-10-CM

## 2016-11-22 DIAGNOSIS — C50212 Malignant neoplasm of upper-inner quadrant of left female breast: Secondary | ICD-10-CM

## 2016-11-22 DIAGNOSIS — Z171 Estrogen receptor negative status [ER-]: Secondary | ICD-10-CM

## 2016-11-22 DIAGNOSIS — Z803 Family history of malignant neoplasm of breast: Secondary | ICD-10-CM

## 2016-11-22 NOTE — Progress Notes (Signed)
HPI: Ms. Birkland was previously seen in the [facility] clinic due to a personal and family history of cancer and concerns regarding a hereditary predisposition to cancer. Please refer to our prior cancer genetics clinic note for more information regarding Ms. Hoe's medical, social and family histories, and our assessment and recommendations, at the time. Ms. Dagley recent genetic test results were disclosed to her, as were recommendations warranted by these results. These results and recommendations are discussed in more detail below.  CANCER HISTORY:  Oncology History         Breast cancer of upper-inner quadrant of left female breast ([place])   02/24/2016 Mammogram    Left breast mass 11:00 position 4 cm from nipple, 2.4 x 1.6 x 1.9 cm, single axillary lymph node noted, T2 N0 stage IIA clinical stage ([place])      02/24/2016 Initial Diagnosis    Moderate to poorly differentiated IDC, lymph node biopsy negative, ER 0%, PR 0%, HER-2, 2+ by IHC, fish negative, ratio 1.2, Ki-67 59%       04/05/2016 Surgery    Left lumpectomy [name]): IDC grade 3, 2.3 cm, associated high-grade DCIS, margins negative, 0/1 lymph node, T2 N0 stage II a, ER 0%, PR 0%, HER-2 negative ratio 1.38       Chemotherapy    Patient refused adjuvant chemotherapy      05/03/2016 - 06/16/2016 Radiation Therapy    Adjuvant radiation [name]). Left breast boost // 10 Gy with 5 fractions at a dose of 2 Gy/fraction.  Left breast// 50.4 Gy with 28 fractions at a dose of 1.8 Gy/ fraction      10/28/2016 Genetic Testing    Patient has genetic testing done for personal and family history of breast cancer. ATM c.[id number] VUS found on genetic testing.  The Breast/GYN gene panel offered by [facility] includes sequencing and rearrangement analysis for the following 23 genes:  ATM, BARD1, BRCA1, BRCA2, BRIP1, CDH1, CHEK2, EPCAM, FANCC, MLH1, MSH2, MSH6, MUTYH, NBN, NF1, PALB2, PMS2, POLD1, PTEN, RAD51C, RAD51D, RECQL, and TP53.    Negative genetic testing for the MSH2 inversion analysis ([name]).        FAMILY HISTORY:  We obtained a detailed, 4-generation family history.  Significant diagnoses are listed below: Family History  Problem Relation Age of Onset  . Heart attack Mother   . Cancer Mother     breast  . Hypertension Sister   . Hypertension Brother   . Cancer Maternal Aunt     breast  . Cancer Paternal Grandmother     possible cancer, unknown type  . Stroke Neg Hx   . Diabetes Neg Hx     The patient has a son and daughter, five brothers and six sisters - none of whom have cancer.  Her mother was diagnosed with breast cancer at 58 and she is now 42.  Her mother had two brothers and a sister.  The sister was diagnosed with breast cancer over the age of 50.  There is no other reported family history of cancer on the maternal side.  The patient's father is deceased.  He had a sister and brother who are cancer free.  His mother may have had cancer, but patient is not sure.  There is no other reported family history of cancer.  Ms. Bober is unaware of previous family history of genetic testing for hereditary cancer risks. Patient's maternal ancestors are of African American descent, and paternal ancestors are of African American descent. There is  no reported Ashkenazi Jewish ancestry. There is no known consanguinity.  GENETIC TEST RESULTS: Genetic testing reported out on October 28, 2016 through the Breast/Gyn cancer panel and MSH2 inversion analysis found no deleterious mutations.  The Breast/GYN gene panel offered by [facility] includes sequencing and rearrangement analysis for the following 23 genes:  ATM, BARD1, BRCA1, BRCA2, BRIP1, CDH1, CHEK2, EPCAM, FANCC, MLH1, MSH2, MSH6, MUTYH, NBN, NF1, PALB2, PMS2, POLD1, PTEN, RAD51C, RAD51D, RECQL, and TP53.  Negative genetic testing for the MSH2 inversion analysis ([name]).    The test report has been scanned into EPIC and is located under the  Molecular Pathology section of the Results Review tab.   We discussed with Ms. Stillinger that since the current genetic testing is not perfect, it is possible there may be a gene mutation in one of these genes that current testing cannot detect, but that chance is small. We also discussed, that it is possible that another gene that has not yet been discovered, or that we have not yet tested, is responsible for the cancer diagnoses in the family, and it is, therefore, important to remain in touch with cancer genetics in the future so that we can continue to offer Ms. Searing the most up to date genetic testing.   Genetic testing did detect a Variant of Unknown Significance in the ATM gene called c.[id number].  At this time, it is unknown if this variant is associated with increased cancer risk or if this is a normal finding, but most variants such as this get reclassified to being inconsequential. It should not be used to make medical management decisions. With time, we suspect the lab will determine the significance of this variant, if any. If we do learn more about it, we will try to contact Ms. Drach to discuss it further. However, it is important to stay in touch with us periodically and keep the address and phone number up to date.  CANCER SCREENING RECOMMENDATIONS:  This normal result is reassuring and indicates that Ms. Layton does not likely have an increased risk of cancer due to a mutation in one of these genes.  We, therefore, recommended  Ms. Macdonnell continue to follow the cancer screening guidelines provided by her primary healthcare providers.   RECOMMENDATIONS FOR FAMILY MEMBERS: Women in this family might be at some increased risk of developing cancer, over the general population risk, simply due to the family history of cancer. We recommended women in this family have a yearly mammogram beginning at age 40, or 10 years younger than the earliest onset of cancer, an annual clinical breast exam, and perform  monthly breast self-exams. Women in this family should also have a gynecological exam as recommended by their primary provider. All family members should have a colonoscopy by age 50.  Based on Ms. Lalone's family history, we recommended her mother, who was diagnosed with breast cancer at age 81, have genetic counseling and testing. Ms. Thalman will let us know if we can be of any assistance in coordinating genetic counseling and/or testing for this family member.   FOLLOW-UP: Lastly, we discussed with Ms. Wickey that cancer genetics is a rapidly advancing field and it is possible that new genetic tests will be appropriate for her and/or her family members in the future. We encouraged her to remain in contact with cancer genetics on an annual basis so we can update her personal and family histories and let her know of advances in cancer genetics that may benefit this family.     Our contact number was provided. Ms. Padron's questions were answered to her satisfaction, and she knows she is welcome to call us at anytime with additional questions or concerns.   Karen Powell, MS, CGC Certified Genetic Counselor [email or web address]  

## 2016-12-01 DIAGNOSIS — M62838 Other muscle spasm: Secondary | ICD-10-CM | POA: Diagnosis not present

## 2016-12-01 DIAGNOSIS — M9903 Segmental and somatic dysfunction of lumbar region: Secondary | ICD-10-CM | POA: Diagnosis not present

## 2016-12-09 ENCOUNTER — Encounter: Payer: BLUE CROSS/BLUE SHIELD | Admitting: Internal Medicine

## 2016-12-14 DIAGNOSIS — K5900 Constipation, unspecified: Secondary | ICD-10-CM | POA: Diagnosis not present

## 2016-12-14 DIAGNOSIS — Z853 Personal history of malignant neoplasm of breast: Secondary | ICD-10-CM | POA: Diagnosis not present

## 2016-12-14 DIAGNOSIS — R109 Unspecified abdominal pain: Secondary | ICD-10-CM | POA: Diagnosis not present

## 2016-12-15 ENCOUNTER — Ambulatory Visit (INDEPENDENT_AMBULATORY_CARE_PROVIDER_SITE_OTHER): Payer: BLUE CROSS/BLUE SHIELD | Admitting: Internal Medicine

## 2016-12-15 ENCOUNTER — Encounter: Payer: Self-pay | Admitting: Internal Medicine

## 2016-12-15 VITALS — BP 110/74 | HR 80 | Ht 68.0 in | Wt 210.2 lb

## 2016-12-15 DIAGNOSIS — I428 Other cardiomyopathies: Secondary | ICD-10-CM | POA: Diagnosis not present

## 2016-12-15 DIAGNOSIS — Z95810 Presence of automatic (implantable) cardiac defibrillator: Secondary | ICD-10-CM

## 2016-12-15 DIAGNOSIS — I5022 Chronic systolic (congestive) heart failure: Secondary | ICD-10-CM | POA: Diagnosis not present

## 2016-12-15 LAB — CUP PACEART INCLINIC DEVICE CHECK
Brady Statistic RA Percent Paced: 1 % — CL
Brady Statistic RV Percent Paced: 1 % — CL
Date Time Interrogation Session: 20180222050000
HIGH POWER IMPEDANCE MEASURED VALUE: 51 Ohm
Implantable Lead Implant Date: 20090320
Implantable Lead Location: 753859
Implantable Lead Model: 158
Implantable Lead Model: 5076
Implantable Lead Serial Number: 182504
Lead Channel Impedance Value: 516 Ohm
Lead Channel Impedance Value: 534 Ohm
Lead Channel Pacing Threshold Amplitude: 0.8 V
Lead Channel Pacing Threshold Pulse Width: 0.4 ms
Lead Channel Sensing Intrinsic Amplitude: 2.1 mV
Lead Channel Setting Pacing Amplitude: 2 V
Lead Channel Setting Pacing Amplitude: 2.5 V
MDC IDC LEAD IMPLANT DT: 20090320
MDC IDC LEAD LOCATION: 753860
MDC IDC MSMT LEADCHNL RA PACING THRESHOLD AMPLITUDE: 0.4 V
MDC IDC MSMT LEADCHNL RA PACING THRESHOLD PULSEWIDTH: 0.4 ms
MDC IDC MSMT LEADCHNL RV SENSING INTR AMPL: 25 mV — AB
MDC IDC PG IMPLANT DT: 20160307
MDC IDC PG SERIAL: 111826
MDC IDC SET LEADCHNL RV PACING PULSEWIDTH: 0.4 ms
MDC IDC SET LEADCHNL RV SENSING SENSITIVITY: 0.6 mV

## 2016-12-15 MED ORDER — SPIRONOLACTONE 25 MG PO TABS: 12.5000 mg | ORAL_TABLET | Freq: Every day | ORAL | 3 refills | Status: DC

## 2016-12-15 NOTE — Patient Instructions (Addendum)
Medication Instructions: - Your physician recommends that you continue on your current medications as directed. Please refer to the Current Medication list given to you today.  Labwork: - none ordered  Procedures/Testing: - none ordered  Follow-Up: - Your physician recommends that you schedule a follow-up appointment in: April 2018 with Dr. [name]  - Your physician wants you to follow-up in: 1 year with Tommye Standard, PA for Dr. [name].  You will receive a reminder letter in the mail two months in advance. If you don't receive a letter, please call our office to schedule the follow-up appointment.  Any Additional Special Instructions Will Be Listed Below (If Applicable).     If you need a refill on your cardiac medications before your next appointment, please call your pharmacy.

## 2016-12-15 NOTE — Progress Notes (Signed)
Patient Care Team: Terald Sleeper, PA-C as PCP - General (General Practice) Nicholas Lose, MD as Consulting Physician (Hematology and Oncology) Kyung Rudd, MD as Consulting Physician (Radiation Oncology) [name], MD as Consulting Physician (General Surgery)   HPI  [name] [name] is a 56 y.o. female Seen in followup for NICM s/p ICD implantation with generator replacement [employer]  3.16  DATE TEST    8/14   TTE   EF 30-40 %   1/15    TTE   EF 35-40 %   10/17 Cath EF 25-30 Normal CA   She is intercurrently been diagnosed with breast cancer and underwent lumpectomy but declined adjuvant chemotherapy; she did have radiation therapy.   She had been on Entresto. There, since she was better; more recently however it was discontinued because of abdominal pain  She has some dyspnea on exertion with housework. She is still able to work. She does not have peripheral edema nocturnal dyspnea or orthopnea.  Date Cr K Mg  10/17  0.93 4.0 *       Records and Results Reviewed As above   Past Medical History:  Diagnosis Date  . AICD (automatic cardioverter/defibrillator) present 09   [employer]- replaced 2016  . Arthritis   . Bradycardia   . Breast cancer ([facility] [place]) 02/24/16   Left breast cancer  . CHF (congestive heart failure) ([place])   . Cough due to angiotensin-converting enzyme inhibitor    But patient chooses to continue ACE  . Dizziness    Dizziness with standing after squatting, March, 2012  . Dyslipidemia    LDL elevation,Patient does not want statin  . Ejection fraction < 50%   . Family history of breast cancer   . GERD (gastroesophageal reflux disease)   . Hypothyroidism   . ICD (implantable cardiac defibrillator) battery depletion    Dr. [name], June, 2009(artifactual atrial tachycardia response events addressed by reprogramming in parentheses  . Itching   . Leg swelling    right leg  . Nonischemic cardiomyopathy ([place])    Etiology unknown, diagnosed  2008, patient has never had catheterization or nuclear study as of March, 2011  . Pain    left leg & left knee  . Stroke ([place]) 07   tia  . TIA (transient ischemic attack)    No CT or MRI abnormality, aspirin therapy  . Weight gain     Past Surgical History:  Procedure Laterality Date  . BREAST LUMPECTOMY WITH RADIOACTIVE SEED AND SENTINEL LYMPH NODE BIOPSY Left 04/05/2016   Procedure: BREAST LUMPECTOMY WITH RADIOACTIVE SEED AND SENTINEL LYMPH NODE BIOPSY;  Surgeon: [name], MD;  Location: [facility];  Service: General;  Laterality: Left;  . CARDIAC CATHETERIZATION N/A 08/05/2016   Procedure: Left Heart Cath and Coronary Angiography;  Surgeon: Peter M Martinique, MD;  Location: [facility] CV LAB;  Service: Cardiovascular;  Laterality: N/A;  . CARDIAC DEFIBRILLATOR PLACEMENT  09   [employer] no remote  . COLONOSCOPY    . CYST EXCISION Left    back of leg-lt   . IMPLANTABLE CARDIOVERTER DEFIBRILLATOR GENERATOR CHANGE N/A 12/29/2014   Procedure: IMPLANTABLE CARDIOVERTER DEFIBRILLATOR GENERATOR CHANGE;  Surgeon: [name], MD;  Location: [facility] CATH LAB;  Service: Cardiovascular;  Laterality: N/A;  . LATERAL EPICONDYLE RELEASE  10/11/2012   Procedure: TENNIS ELBOW RELEASE;  Surgeon: [name], MD;  Location: [facility];  Service: Orthopedics;  Laterality: Right;  RIGHT ELBOW: TENOTOMY ELBOW LATERAL  EPICONDYLITIS TENNIS ELBOW: RADIAL TUNNEL RELEASE  . PORT-A-CATH REMOVAL  04/13/2016   Procedure: MINOR REMOVAL PORT-A-CATH;  Surgeon: [name], MD;  Location: [facility];  Service: General;;  . PORTACATH PLACEMENT Right 04/05/2016   Procedure: INSERTION PORT-A-CATH WITH [place];  Surgeon: [name], MD;  Location: [facility];  Service: General;  Laterality: Right;  right IJ  . TOTAL THYROIDECTOMY  2001  . TUBAL LIGATION  94  . UPPER GI ENDOSCOPY      Current Outpatient Prescriptions  Medication Sig Dispense Refill  . aspirin EC 81 MG tablet Take 162 mg by  mouth daily.     . carvedilol (COREG) 12.5 MG tablet TAKE ONE TABLET BY MOUTH TWICE DAILY WITH A MEAL 60 tablet 5  . levothyroxine (SYNTHROID, LEVOTHROID) 88 MCG tablet Take 88 mcg by mouth daily.     [name] lisinopril (PRINIVIL,ZESTRIL) 5 MG tablet Take 1 tablet (5 mg total) by mouth daily. 90 tablet 3  . Probiotic Product (PROBIOTIC PO) Take 1 tablet by mouth daily.    . ranitidine (ZANTAC) 150 MG tablet Take 150 mg by mouth daily.     No current facility-administered medications for this visit.     Allergies  Allergen Reactions  . Avelox [Moxifloxacin Hcl In Nacl] Shortness Of Breath    Shortness of breath  . Clindamycin/Lincomycin Shortness Of Breath and Diarrhea    Breathing/diarrhea    . Kiwi Extract Anaphylaxis    Makes pt. Feel like her throat is closing   . Bactrim [Sulfamethoxazole-Trimethoprim] Swelling  . Levofloxacin Other (See Comments)    JOINT PAIN JOINT PAIN  . Moxifloxacin Other (See Comments)    Breathing problems Breathing problems  . Spironolactone Other (See Comments)    burning  . Sulfa Antibiotics Swelling      Review of Systems negative except from HPI and PMH  Physical Exam BP 110/74   Pulse 80   Ht 5\' 8"  (1.727 m)   Wt 210 lb 3.2 oz (95.3 kg)   SpO2 98%   BMI 31.96 kg/m  Well developed and well nourished in no acute distress HENT normal E scleral and icterus clear Neck Supple JVP flat; carotids brisk and full Clear to ausculation Device pocket well healed; without hematoma or erythema.  There is no tethering Regular rate and rhythm, no murmurs gallops or rub Soft with active bowel sounds No clubbing cyanosis  Edema Alert and oriented, grossly normal motor and sensory function Skin Warm and Dry  ECG demonstrates sinus rhythm at 75 Intervals 18/09/38 Nonspecific ST T changes LVH Rare PVC  Assessment and  Plan  NICM  CHF chronic systolic  ICD [employer]  Breast Cancer   Nonsustained VT     Euvolemic continue current  meds  With her cardiomyopathy, and having run out of her diuretics I was going to start spironolactone. However, she notes that she had tried it before, and HM found the reports from 2015 where it there was a body burning which improved with the drugs discontinuation. Hence, we will leave things the way they are  Nonsustained VT is without symptoms.    Current medicines are reviewed at length with the patient today .  The patient does not have concerns regarding medicines. We spent more than 50% of our >25 min visit in face to face counseling regarding the above

## 2016-12-19 DIAGNOSIS — K5900 Constipation, unspecified: Secondary | ICD-10-CM | POA: Diagnosis not present

## 2016-12-19 DIAGNOSIS — R109 Unspecified abdominal pain: Secondary | ICD-10-CM | POA: Diagnosis not present

## 2016-12-19 DIAGNOSIS — Z853 Personal history of malignant neoplasm of breast: Secondary | ICD-10-CM | POA: Diagnosis not present

## 2016-12-23 DIAGNOSIS — Z008 Encounter for other general examination: Secondary | ICD-10-CM | POA: Diagnosis not present

## 2016-12-23 DIAGNOSIS — Z6830 Body mass index (BMI) 30.0-30.9, adult: Secondary | ICD-10-CM | POA: Diagnosis not present

## 2016-12-23 DIAGNOSIS — R208 Other disturbances of skin sensation: Secondary | ICD-10-CM | POA: Diagnosis not present

## 2016-12-24 DIAGNOSIS — H04123 Dry eye syndrome of bilateral lacrimal glands: Secondary | ICD-10-CM | POA: Diagnosis not present

## 2016-12-24 DIAGNOSIS — H40033 Anatomical narrow angle, bilateral: Secondary | ICD-10-CM | POA: Diagnosis not present

## 2016-12-28 ENCOUNTER — Ambulatory Visit (INDEPENDENT_AMBULATORY_CARE_PROVIDER_SITE_OTHER): Payer: BLUE CROSS/BLUE SHIELD | Admitting: Physician Assistant

## 2016-12-28 ENCOUNTER — Encounter: Payer: Self-pay | Admitting: Physician Assistant

## 2016-12-28 VITALS — BP 131/60 | HR 98 | Temp 97.5°F | Ht 68.0 in | Wt 212.0 lb

## 2016-12-28 DIAGNOSIS — J329 Chronic sinusitis, unspecified: Secondary | ICD-10-CM

## 2016-12-28 DIAGNOSIS — K219 Gastro-esophageal reflux disease without esophagitis: Secondary | ICD-10-CM

## 2016-12-28 MED ORDER — AMOXICILLIN 500 MG PO CAPS: 1000.0000 mg | ORAL_CAPSULE | Freq: Two times a day (BID) | ORAL | 0 refills | Status: DC

## 2016-12-28 NOTE — Patient Instructions (Signed)

## 2016-12-29 DIAGNOSIS — M9903 Segmental and somatic dysfunction of lumbar region: Secondary | ICD-10-CM | POA: Diagnosis not present

## 2016-12-29 DIAGNOSIS — M5430 Sciatica, unspecified side: Secondary | ICD-10-CM | POA: Diagnosis not present

## 2016-12-30 NOTE — Progress Notes (Signed)
BP 131/60   Pulse 98   Temp 97.5 F (36.4 C) (Oral)   Ht 5\' 8"  (1.727 m)   Wt 212 lb (96.2 kg)   BMI 32.23 kg/m    Subjective:    Patient ID: [name] [name], female    DOB: [birth year], 56 y.o.   MRN: [id number]  HPI: [name] [name] is a 56 y.o. female presenting on 12/28/2016 for Headache and Sinusitis  This patient has had many days of sinus headache and postnasal drainage. There is copious drainage at times. Denies any fever at this time. There has been a history of sinus infections in the past.  No history of sinus surgery. There is cough at night. It has become more prevalent in recent days.  This patient comes in for periodic recheck on medications and conditions including GERD. Refill is needed.   All medications are reviewed today. There are no reports of any problems with the medications. All of the medical conditions are reviewed and updated.  Lab work is reviewed and will be ordered as medically necessary. There are no new problems reported with today's visit.   Relevant past medical, surgical, family and social history reviewed and updated as indicated. Allergies and medications reviewed and updated.  Past Medical History:  Diagnosis Date  . AICD (automatic cardioverter/defibrillator) present 09   [employer]- replaced 2016  . Arthritis   . Bradycardia   . Breast cancer ([place]) 02/24/16   Left breast cancer  . CHF (congestive heart failure) ([place])   . Cough due to angiotensin-converting enzyme inhibitor    But patient chooses to continue ACE  . Dizziness    Dizziness with standing after squatting, March, 2012  . Dyslipidemia    LDL elevation,Patient does not want statin  . Ejection fraction < 50%   . Family history of breast cancer   . GERD (gastroesophageal reflux disease)   . Hypothyroidism   . ICD (implantable cardiac defibrillator) battery depletion    Dr. [name], June, 2009(artifactual atrial tachycardia response events addressed by reprogramming in parentheses   . Itching   . Leg swelling    right leg  . Nonischemic cardiomyopathy ([place])    Etiology unknown, diagnosed 2008, patient has never had catheterization or nuclear study as of March, 2011  . Pain    left leg & left knee  . Stroke ([place]) 07   tia  . TIA (transient ischemic attack)    No CT or MRI abnormality, aspirin therapy  . Weight gain     Past Surgical History:  Procedure Laterality Date  . BREAST LUMPECTOMY WITH RADIOACTIVE SEED AND SENTINEL LYMPH NODE BIOPSY Left 04/05/2016   Procedure: BREAST LUMPECTOMY WITH RADIOACTIVE SEED AND SENTINEL LYMPH NODE BIOPSY;  Surgeon: [name], MD;  Location: [facility];  Service: General;  Laterality: Left;  . CARDIAC CATHETERIZATION N/A 08/05/2016   Procedure: Left Heart Cath and Coronary Angiography;  Surgeon: Peter M Martinique, MD;  Location: [facility] CV LAB;  Service: Cardiovascular;  Laterality: N/A;  . CARDIAC DEFIBRILLATOR PLACEMENT  09   [employer] no remote  . COLONOSCOPY    . CYST EXCISION Left    back of leg-lt   . IMPLANTABLE CARDIOVERTER DEFIBRILLATOR GENERATOR CHANGE N/A 12/29/2014   Procedure: IMPLANTABLE CARDIOVERTER DEFIBRILLATOR GENERATOR CHANGE;  Surgeon: [name], MD;  Location[facility] CATH LAB;  Service: Cardiovascular;  Laterality: N/A;  . LATERAL EPICONDYLE RELEASE  10/11/2012   Procedure: TENNIS ELBOW RELEASE;  Surgeon: [name], MD;  Location: [facility];  Service: Orthopedics;  Laterality: Right;  RIGHT ELBOW: TENOTOMY ELBOW LATERAL EPICONDYLITIS TENNIS ELBOW: RADIAL TUNNEL RELEASE  . PORT-A-CATH REMOVAL  04/13/2016   Procedure: MINOR REMOVAL PORT-A-CATH;  Surgeon: [name], MD;  Location: [facility];  Service: General;;  . PORTACATH PLACEMENT Right 04/05/2016   Procedure: INSERTION PORT-A-CATH WITH [place];  Surgeon: [name], MD;  Location: [facility];  Service: General;  Laterality: Right;  right IJ  . TOTAL THYROIDECTOMY  2001  . TUBAL LIGATION  94  . UPPER GI ENDOSCOPY       Review of Systems  Constitutional: Positive for chills and fatigue. Negative for activity change and appetite change.  HENT: Positive for congestion, postnasal drip and sore throat.   Eyes: Negative.   Respiratory: Positive for cough and wheezing.   Cardiovascular: Negative.  Negative for chest pain, palpitations and leg swelling.  Gastrointestinal: Negative.   Genitourinary: Negative.   Musculoskeletal: Negative.   Skin: Negative.   Neurological: Positive for headaches.    Allergies as of 12/28/2016      Reactions   Avelox [moxifloxacin Hcl In Nacl] Shortness Of Breath   Shortness of breath   Clindamycin/lincomycin Shortness Of Breath, Diarrhea   Breathing/diarrhea   Kiwi Extract Anaphylaxis   Makes pt. Feel like her throat is closing   Bactrim [sulfamethoxazole-trimethoprim] Swelling   Levofloxacin Other (See Comments)   JOINT PAIN JOINT PAIN   Moxifloxacin Other (See Comments)   Breathing problems Breathing problems   Spironolactone Other (See Comments)   burning   Sulfa Antibiotics Swelling      Medication List       Accurate as of 12/28/16 11:59 PM. Always use your most recent med list.          amoxicillin 500 MG capsule Commonly known as:  AMOXIL Take 2 capsules (1,000 mg total) by mouth 2 (two) times daily.   aspirin EC 81 MG tablet Take 162 mg by mouth daily.   carvedilol 12.5 MG tablet Commonly known as:  COREG TAKE ONE TABLET BY MOUTH TWICE DAILY WITH A MEAL   levothyroxine 88 MCG tablet Commonly known as:  SYNTHROID, LEVOTHROID Take 88 mcg by mouth daily.   lisinopril 5 MG tablet Commonly known as:  PRINIVIL,ZESTRIL Take 1 tablet (5 mg total) by mouth daily.   omeprazole 20 MG capsule Commonly known as:  PRILOSEC Take 20 mg by mouth daily.   PROBIOTIC PO Take 1 tablet by mouth daily.          Objective:    BP 131/60   Pulse 98   Temp 97.5 F (36.4 C) (Oral)   Ht 5\' 8"  (1.727 m)   Wt 212 lb (96.2 kg)   BMI 32.23 kg/m    Allergies  Allergen Reactions  . Avelox [Moxifloxacin Hcl In Nacl] Shortness Of Breath    Shortness of breath  . Clindamycin/Lincomycin Shortness Of Breath and Diarrhea    Breathing/diarrhea    . Kiwi Extract Anaphylaxis    Makes pt. Feel like her throat is closing   . Bactrim [Sulfamethoxazole-Trimethoprim] Swelling  . Levofloxacin Other (See Comments)    JOINT PAIN JOINT PAIN  . Moxifloxacin Other (See Comments)    Breathing problems Breathing problems  . Spironolactone Other (See Comments)    burning  . Sulfa Antibiotics Swelling    Physical Exam  Constitutional: She is oriented to person, place, and time. She appears well-developed and well-nourished.  HENT:  Head: Normocephalic and atraumatic.  Right Ear: Tympanic membrane and external ear normal. No middle ear effusion.  Left Ear: Tympanic membrane and external ear normal.  No middle ear effusion.  Nose: Mucosal edema and rhinorrhea present. Right sinus exhibits no maxillary sinus tenderness. Left sinus exhibits no maxillary sinus tenderness.  Mouth/Throat: Uvula is midline. Posterior oropharyngeal erythema present.  Eyes: Conjunctivae and EOM are normal. Pupils are equal, round, and reactive to light. Right eye exhibits no discharge. Left eye exhibits no discharge.  Neck: Normal range of motion.  Cardiovascular: Normal rate, regular rhythm and normal heart sounds.   Pulmonary/Chest: Effort normal and breath sounds normal. No respiratory distress. She has no wheezes.  Abdominal: Soft.  Lymphadenopathy:    She has no cervical adenopathy.  Neurological: She is alert and oriented to person, place, and time.  Skin: Skin is warm and dry.  Psychiatric: She has a normal mood and affect.  Nursing note and vitals reviewed.       Assessment & Plan:   1. Sinusitis, unspecified chronicity, unspecified location - amoxicillin (AMOXIL) 500 MG capsule; Take 2 capsules (1,000 mg total) by mouth 2 (two) times daily.  Dispense:  40 capsule; Refill: 0  2. Gastroesophageal reflux disease without esophagitis - omeprazole (PRILOSEC) 20 MG capsule; Take 20 mg by mouth daily.   Continue all other maintenance medications as listed above.  Follow up plan: Return if symptoms worsen or fail to improve.  Educational handout given for sinusitis  Terald Sleeper PA-C [facility] [address]  [place], [place] [address] [phone]   12/30/2016, 11:00 AM

## 2017-01-02 DIAGNOSIS — M5430 Sciatica, unspecified side: Secondary | ICD-10-CM | POA: Diagnosis not present

## 2017-01-02 DIAGNOSIS — M9903 Segmental and somatic dysfunction of lumbar region: Secondary | ICD-10-CM | POA: Diagnosis not present

## 2017-01-03 DIAGNOSIS — G44219 Episodic tension-type headache, not intractable: Secondary | ICD-10-CM | POA: Diagnosis not present

## 2017-01-11 DIAGNOSIS — M5430 Sciatica, unspecified side: Secondary | ICD-10-CM | POA: Diagnosis not present

## 2017-01-11 DIAGNOSIS — M9903 Segmental and somatic dysfunction of lumbar region: Secondary | ICD-10-CM | POA: Diagnosis not present

## 2017-01-11 DIAGNOSIS — G44219 Episodic tension-type headache, not intractable: Secondary | ICD-10-CM | POA: Diagnosis not present

## 2017-01-16 ENCOUNTER — Telehealth: Payer: Self-pay | Admitting: Physician Assistant

## 2017-01-16 MED ORDER — CEFDINIR 300 MG PO CAPS: 300.0000 mg | ORAL_CAPSULE | Freq: Two times a day (BID) | ORAL | 0 refills | Status: DC

## 2017-01-16 NOTE — Telephone Encounter (Signed)
Rx sent, patient aware.

## 2017-01-16 NOTE — Telephone Encounter (Signed)
Patient seen [name] [name] on 3/7 and was given Amoxicillin. Patient states that she got better for a few days and then sick again. Patient is having - nasal congestion, cough ear pain and facial pressure. Patient states it is  Mostly on the right side like it was when seen. Would like a antidotic sent in. Please advise and send to the pools.

## 2017-01-16 NOTE — Telephone Encounter (Signed)
Call in omnicef 300 mg #20 one tab po BID 10 days.

## 2017-01-25 DIAGNOSIS — M9903 Segmental and somatic dysfunction of lumbar region: Secondary | ICD-10-CM | POA: Diagnosis not present

## 2017-01-25 DIAGNOSIS — M5430 Sciatica, unspecified side: Secondary | ICD-10-CM | POA: Diagnosis not present

## 2017-01-26 DIAGNOSIS — M5430 Sciatica, unspecified side: Secondary | ICD-10-CM | POA: Diagnosis not present

## 2017-01-26 DIAGNOSIS — M9903 Segmental and somatic dysfunction of lumbar region: Secondary | ICD-10-CM | POA: Diagnosis not present

## 2017-01-30 ENCOUNTER — Ambulatory Visit (INDEPENDENT_AMBULATORY_CARE_PROVIDER_SITE_OTHER): Payer: BLUE CROSS/BLUE SHIELD | Admitting: Cardiology

## 2017-01-30 ENCOUNTER — Encounter: Payer: Self-pay | Admitting: Cardiology

## 2017-01-30 VITALS — BP 126/68 | HR 62 | Ht 68.0 in | Wt 210.0 lb

## 2017-01-30 DIAGNOSIS — R0989 Other specified symptoms and signs involving the circulatory and respiratory systems: Secondary | ICD-10-CM | POA: Diagnosis not present

## 2017-01-30 DIAGNOSIS — Z95810 Presence of automatic (implantable) cardiac defibrillator: Secondary | ICD-10-CM | POA: Diagnosis not present

## 2017-01-30 DIAGNOSIS — R0609 Other forms of dyspnea: Secondary | ICD-10-CM

## 2017-01-30 DIAGNOSIS — I5043 Acute on chronic combined systolic (congestive) and diastolic (congestive) heart failure: Secondary | ICD-10-CM | POA: Diagnosis not present

## 2017-01-30 DIAGNOSIS — I428 Other cardiomyopathies: Secondary | ICD-10-CM

## 2017-01-30 DIAGNOSIS — M9903 Segmental and somatic dysfunction of lumbar region: Secondary | ICD-10-CM | POA: Diagnosis not present

## 2017-01-30 DIAGNOSIS — M5430 Sciatica, unspecified side: Secondary | ICD-10-CM | POA: Diagnosis not present

## 2017-01-30 DIAGNOSIS — R0600 Dyspnea, unspecified: Secondary | ICD-10-CM

## 2017-01-30 DIAGNOSIS — R9430 Abnormal result of cardiovascular function study, unspecified: Secondary | ICD-10-CM

## 2017-01-30 MED ORDER — SACUBITRIL-VALSARTAN 24-26 MG PO TABS: 1.0000 | ORAL_TABLET | Freq: Two times a day (BID) | ORAL | 3 refills | Status: DC

## 2017-01-30 NOTE — Patient Instructions (Signed)
Medication Instructions:  Your physician has recommended you make the following change in your medication: You are to stop taking your lisinopril. We are starting you on Entresto 26/24 but do not start taking this until 02/01/2017.   Labwork: None Ordered   Testing/Procedures: None Ordered   Follow-Up: Your physician recommends that you schedule a follow-up appointment in: 4 weeks with Dr. [name]  Any Other Special Instructions Will Be Listed Below (If Applicable).     If you need a refill on your cardiac medications before your next appointment, please call your pharmacy.

## 2017-01-30 NOTE — Progress Notes (Signed)
Patient ID: [name] [name], female   DOB: [birth year], 56 y.o.   MRN: [id number]      Cardiology Office Note  Date:  01/30/2017   ID:  [name] [name], DOB 04-22-1961, MRN [id number]  PCP:  Terald Sleeper, PA-C  Cardiologist:  Dr. Ron Parker  Now Dr. [name] EP Dr. [name] Referring physician: Dr. Shann Medal, M.D. general surgeon  Chief complaint: follow up for NICM, preop evaluation for breast surgery   History of Present Illness: [name] [name] is a 56 y.o. female who presents for ICM follow up. She has a hx of nonischemic cardiomyopathy and bradycardia status post ICD implantation for primary prevention.  She has not had ischemic work up.  She has been followed by Dr. Ron Parker. 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 She denies any chest pain, shortness of breath, lower extremity edema, orthopnea or PND. She only takes Lasix as needed. She has noticed that her blood pressure has been going At her last visit to the OB/GYN it was 170. For the last 2 months she has been 8 experiencing significant discomfort for secondary to her vulvodynia, testing for infections, STDs has all been negative. She has been tried on Premarin without any significant improvement. She denies any ICD firing, palpitations or syncope. Pt was intolerant to spironolactone due to burning and she does not wish to resume. She could not aford BiDil today her BP is stable. Her PCP started her on simvastatin for elevated chol.  Pt will bring by labs at another time.  03/14/2016, the patient is coming after 3 months, her repeat echocardiogram performed in April 2017 showed decreased LVEF of 30-35%. We have switched her medication and started her on Entresto 24/26 mg po BID. the patient was also diagnosed with stage II breast cancer on 02/26/2016 and is scheduled to undergo lumpectomy in the very near future by Dr. [name]. She underwent biopsy of her left  breast that showed 2.4 cm invasive ductal cancer of the left breast , with ER 0%, PR 0%, Ki-67 59%, and HER-2 Neu pending. Her plan for chemotherapy and radiation therapy is nonconclusive at this point and depends on results of breast surgery and results of the biopsy of her axillar lymph nodes. The patient states that overall since she was started on Entresto she has more energy, denies any chest pain or dyspnea on exertion, no shortness of breath at rest no lower extremity edema no orthopnea proximal nocturnal dyspnea no palpitations or syncope. She has been compliant with her meds. She can certainly walk several blocks.  08/11/2016 - the patient underwent left lumpectomy for breast cancer of her left breast, margins negative, 0/1 lymph node, T2 N0 stage II a, ER 0%, PR 0%, HER-2 negative ratio 1.38, she refused adjuvant chemotherapy (patient fully understands that she is at very high risk of recurrence) and received adjuvant radiation from 05/03/2016 to 06/16/2016. From now on she will receive annual mammograms. Otherwise no further therapy for breast cancer. She underwent left cardiac catheterization in October 2017 for worsening LVEF. Her coronaries were normal.  Today she states that she had an episode of acute sinusitis and currently feels like she has no energy in addition to her baseline fatigue. She tried to walk to her mother's house there is only a few minutes away from her house and had to stop several times. She denies  any lower extremity edema no orthopnea or paroxysmal nocturnal dyspnea. She didn't tolerate Entresto as she developed abdominal pain that was worsening and resolved after she discontinued. She now states that she continues to have abdominal pain that she attributes to IBS and is willing to start taking Entresto again. No palpitation no recent ICD firing.   Past Medical History:  Diagnosis Date  . AICD (automatic cardioverter/defibrillator) present 09   [employer]-  replaced 2016  . Arthritis   . Bradycardia   . Breast cancer ([place]) 02/24/16   Left breast cancer  . CHF (congestive heart failure) ([place])   . Cough due to angiotensin-converting enzyme inhibitor    But patient chooses to continue ACE  . Dizziness    Dizziness with standing after squatting, March, 2012  . Dyslipidemia    LDL elevation,Patient does not want statin  . Ejection fraction < 50%   . Family history of breast cancer   . GERD (gastroesophageal reflux disease)   . Hypothyroidism   . ICD (implantable cardiac defibrillator) battery depletion    Dr. [name], June, 2009(artifactual atrial tachycardia response events addressed by reprogramming in parentheses  . Itching   . Leg swelling    right leg  . Nonischemic cardiomyopathy ([place])    Etiology unknown, diagnosed 2008, patient has never had catheterization or nuclear study as of March, 2011  . Pain    left leg & left knee  . Stroke ([place]) 07   tia  . TIA (transient ischemic attack)    No CT or MRI abnormality, aspirin therapy  . Weight gain     Past Surgical History:  Procedure Laterality Date  . BREAST LUMPECTOMY WITH RADIOACTIVE SEED AND SENTINEL LYMPH NODE BIOPSY Left 04/05/2016   Procedure: BREAST LUMPECTOMY WITH RADIOACTIVE SEED AND SENTINEL LYMPH NODE BIOPSY;  Surgeon: [name], MD;  Location: [facility];  Service: General;  Laterality: Left;  . CARDIAC CATHETERIZATION N/A 08/05/2016   Procedure: Left Heart Cath and Coronary Angiography;  Surgeon: Peter M Martinique, MD;  Location: [facility];  Service: Cardiovascular;  Laterality: N/A;  . CARDIAC DEFIBRILLATOR PLACEMENT  09   [employer] no remote  . COLONOSCOPY    . CYST EXCISION Left    back of leg-lt   . IMPLANTABLE CARDIOVERTER DEFIBRILLATOR GENERATOR CHANGE N/A 12/29/2014   Procedure: IMPLANTABLE CARDIOVERTER DEFIBRILLATOR GENERATOR CHANGE;  Surgeon: [name], MD;  Location: [facility] CATH [facility];  Service: Cardiovascular;  Laterality: N/A;  . LATERAL EPICONDYLE  RELEASE  10/11/2012   Procedure: TENNIS ELBOW RELEASE;  Surgeon: [name], MD;  Location: [facility];  Service: Orthopedics;  Laterality: Right;  RIGHT ELBOW: TENOTOMY ELBOW LATERAL EPICONDYLITIS TENNIS ELBOW: RADIAL TUNNEL RELEASE  . PORT-A-CATH REMOVAL  04/13/2016   Procedure: MINOR REMOVAL PORT-A-CATH;  Surgeon: [name], MD;  Location: [facility];  Service: General;;  . PORTACATH PLACEMENT Right 04/05/2016   Procedure: INSERTION PORT-A-CATH WITH [place];  Surgeon: [name], MD;  Location: [facility];  Service: General;  Laterality: Right;  right IJ  . TOTAL THYROIDECTOMY  2001  . TUBAL LIGATION  94  . UPPER GI ENDOSCOPY       Current Outpatient Prescriptions  Medication Sig Dispense Refill  . aspirin EC 81 MG tablet Take 162 mg by mouth daily.     . carvedilol (COREG) 12.5 MG tablet TAKE ONE TABLET BY MOUTH TWICE DAILY WITH A MEAL 60 tablet 5  . Cholecalciferol (VITAMIN D3) 2000 units TABS Take  2,000 Units by mouth daily.    [name] dicyclomine (BENTYL) 10 MG capsule Take 10 mg by mouth 2 (two) times daily.    [name] levothyroxine (SYNTHROID, LEVOTHROID) 88 MCG tablet Take 88 mcg by mouth daily.     [name] omeprazole (PRILOSEC) 20 MG capsule Take 20 mg by mouth daily.    . Probiotic Product (PROBIOTIC PO) Take 1 tablet by mouth daily.    Derrill Memo ON 02/01/2017] sacubitril-valsartan (ENTRESTO) 24-26 MG Take 1 tablet by mouth 2 (two) times daily. 60 tablet 3   No current facility-administered medications for this visit.     Allergies:   Avelox [moxifloxacin hcl in nacl]; Clindamycin/lincomycin; Kiwi extract; Bactrim [sulfamethoxazole-trimethoprim]; Levofloxacin; Moxifloxacin; Spironolactone; and Sulfa antibiotics   Social History:  The patient  reports that she has never smoked. She has never used smokeless tobacco. She reports that she does not drink alcohol or use drugs.   Family History:  The patient's family history includes Cancer in her maternal aunt, mother,  and paternal grandmother; Heart attack in her mother; Hypertension in her brother and sister.   ROS:  General:no colds or fevers, no weight changes Skin:no rashes or ulcers HEENT:no blurred vision, no congestion CV:see HPI PUL:see HPI GI:no diarrhea constipation or melena, no indigestion GU:no hematuria, no dysuria MS:no joint pain, no claudication Neuro:no syncope, no lightheadedness Endo:no diabetes, + thyroid disease  Wt Readings from Last 3 Encounters:  01/30/17 210 lb (95.3 kg)  12/28/16 212 lb (96.2 kg)  12/15/16 210 lb 3.2 oz (95.3 kg)     PHYSICAL EXAM: VS:  BP 126/68   Pulse 62   Ht '5\' 8"'  (1.727 m)   Wt 210 lb (95.3 kg)   SpO2 98%   BMI 31.93 kg/m  , BMI Body mass index is 31.93 kg/m. General:Pleasant affect, NAD Skin:Warm and dry, brisk capillary refill HEENT:normocephalic, sclera clear, mucus membranes moist Neck:supple, no JVD, no bruits  Heart:S1S2 RRR without murmur, gallup, rub or click Lungs:clear without rales, rhonchi, or wheezes CXK:GYJE, non tender, + BS, do not palpate liver spleen or masses Ext:no lower ext edema, 2+ pedal pulses, 2+ radial pulses Neuro:alert and oriented, MAE, follows commands, + facial symmetry  EKG:  EKG is NOT ordered today.  Recent Labs: 08/02/2016: BUN 12; Creat 0.93; Hemoglobin 11.5; Platelets 176; Potassium 4.0; Sodium 144   Lipid Panel No results found for: CHOL, TRIG, HDL, CHOLHDL, VLDL, LDLCALC, LDLDIRECT   TTE: 11/2014  Impressions: - The EF on last echo 8/14 was reported as 45% but appears after my review to be worse that that at 25-30%. The EF on current exam appears to be 35-40%.  TTE: 12/08/2015  Left ventricle: The cavity size was mildly dilated. Wall  thickness was normal. Systolic function was moderately to  severely reduced. The estimated ejection fraction was in the  range of 30% to 35%. Diffuse hypokinesis. - Mitral valve: Mild prolapse, involving the anterior leaflet.  There was mild  regurgitation.  Impressions: - Moderate to severe global reduction in LV systolic function;  grade 1 diastolic dysfunction; mild prolapse of anterior MV  leaflet with mild MR; mild TR. Cannot R/O apical thrombus;  suggest fu limited study with contrast to further assess.  Cardiac catheterization:08/05/2016   There is severe left ventricular systolic dysfunction.  LV end diastolic pressure is normal.  The left ventricular ejection fraction is 25-35% by visual estimate.   1. Normal coronary anatomy 2. Severe LV dysfunction 3. Elevated LV EDP    ASSESSMENT AND PLAN:  1.  NICM most recent LVEF has worsened to 30-35% from 35-40%, [place] II. She underwent left heart catheterization that showed normal coronaries. Unfortunately she didn't tolerate spironolactone in the past. She is willing to restart Entresto. She took last dose of lisinopril yesterday, will continue holding and start Entresto on Wednesday, 02/01/2017. Continue carvedilol. She appears euvolemic doesn't require any diuretics.  I will see her back in 4 weeks, at that time I will recheck her BMP and BNP. If she continues to feel short of breath on minimal exertion I will schedule her for cardiopulmonary testing to evaluate if she would qualify for advanced therapies.  2. Chronic systolic CHF - as above  3.  ICD in place since 2009 exchange in 2016, followed by Dr. [name] no ICD discharges, no QRS candidate for biventricular pacemaker.   4. Hypertension - controlled.  Follow up in 4 weeks.  Signed, Ena Dawley, MD  01/30/2017 10:23 AM    [facility], [place], [place] Phone: [phone]; Fax: [phone]

## 2017-01-31 DIAGNOSIS — M9903 Segmental and somatic dysfunction of lumbar region: Secondary | ICD-10-CM | POA: Diagnosis not present

## 2017-01-31 DIAGNOSIS — M5430 Sciatica, unspecified side: Secondary | ICD-10-CM | POA: Diagnosis not present

## 2017-02-01 ENCOUNTER — Telehealth: Payer: Self-pay

## 2017-02-01 NOTE — Telephone Encounter (Signed)
PA for [name] [name] has be done through covermymeds. Awaiting response.

## 2017-02-06 NOTE — Telephone Encounter (Signed)
**Note De-Identified [name] [name] Obfuscation** Appeal has been done through covermymeds. awaiting response.

## 2017-02-07 NOTE — Telephone Encounter (Signed)
**Note De-Identified [name] Obfuscation** Received fax from [facility] stating that the Appeal has been approved for PA on Entresto. Approval good until 02/07/2018.

## 2017-02-07 NOTE — Telephone Encounter (Signed)
**Note De-Identified [name] [name] Obfuscation** Received form with additional questions through fax from [employer] on the pts Entresto PA. Questions answered and faxed back to [employer]. Awaiting response.

## 2017-02-17 ENCOUNTER — Other Ambulatory Visit: Payer: Self-pay | Admitting: Cardiology

## 2017-02-17 NOTE — Telephone Encounter (Signed)
Medication Detail    Disp Refills Start End   sacubitril-valsartan (ENTRESTO) 24-26 MG 60 tablet 3 02/01/2017    Sig - Route: Take 1 tablet by mouth 2 (two) times daily. - Oral   E-Prescribing Status: Receipt confirmed by pharmacy (01/30/2017 10:23 AM EDT)   Pharmacy   [facility], [address] [id number]

## 2017-02-27 ENCOUNTER — Ambulatory Visit
Admission: RE | Admit: 2017-02-27 | Discharge: 2017-02-27 | Disposition: A | Discharge status: Home / Self Care | Payer: BLUE CROSS/BLUE SHIELD | Source: Ambulatory Visit | Attending: Adult Health | Admitting: Adult Health

## 2017-02-27 ENCOUNTER — Encounter: Payer: Self-pay | Admitting: Physician Assistant

## 2017-02-27 ENCOUNTER — Ambulatory Visit (INDEPENDENT_AMBULATORY_CARE_PROVIDER_SITE_OTHER): Payer: BLUE CROSS/BLUE SHIELD | Admitting: Physician Assistant

## 2017-02-27 VITALS — BP 122/84 | HR 76 | Ht 68.0 in | Wt 204.6 lb

## 2017-02-27 DIAGNOSIS — R922 Inconclusive mammogram: Secondary | ICD-10-CM | POA: Diagnosis not present

## 2017-02-27 DIAGNOSIS — Z95810 Presence of automatic (implantable) cardiac defibrillator: Secondary | ICD-10-CM | POA: Diagnosis not present

## 2017-02-27 DIAGNOSIS — Z171 Estrogen receptor negative status [ER-]: Secondary | ICD-10-CM

## 2017-02-27 DIAGNOSIS — C50212 Malignant neoplasm of upper-inner quadrant of left female breast: Secondary | ICD-10-CM

## 2017-02-27 DIAGNOSIS — I428 Other cardiomyopathies: Secondary | ICD-10-CM

## 2017-02-27 DIAGNOSIS — M9901 Segmental and somatic dysfunction of cervical region: Secondary | ICD-10-CM | POA: Diagnosis not present

## 2017-02-27 DIAGNOSIS — I5022 Chronic systolic (congestive) heart failure: Secondary | ICD-10-CM | POA: Diagnosis not present

## 2017-02-27 LAB — BASIC METABOLIC PANEL
BUN / CREAT RATIO: 14 (ref 9–23)
BUN: 14 mg/dL (ref 6–24)
CHLORIDE: 105 mmol/L (ref 96–106)
CO2: 24 mmol/L (ref 18–29)
Calcium: 9.4 mg/dL (ref 8.7–10.2)
Creatinine, Ser: 0.97 mg/dL (ref 0.57–1.00)
GFR calc non Af Amer: 65 mL/min/{1.73_m2} (ref >59)
GFR, EST AFRICAN AMERICAN: 76 mL/min/{1.73_m2} (ref >59)
GLUCOSE: 78 mg/dL (ref 65–99)
POTASSIUM: 4.5 mmol/L (ref 3.5–5.2)
SODIUM: 142 mmol/L (ref 134–144)

## 2017-02-27 LAB — PRO B NATRIURETIC PEPTIDE: NT-PRO BNP: 427 pg/mL — AB (ref 0–287)

## 2017-02-27 MED ORDER — SACUBITRIL-VALSARTAN 49-51 MG PO TABS: 1.0000 | ORAL_TABLET | Freq: Two times a day (BID) | ORAL | 3 refills | Status: DC

## 2017-02-27 NOTE — Patient Instructions (Addendum)
Medication Instructions:  Your physician has recommended you make the following change in your medication:   START Entresto 49 mg/ 51 mg TWICE DAY  once you have completed Entresto 24 mg/ 26 mg.   Labwork: Your physician recommends that you get the following lab work today: BMET, PRO BNP    Testing/Procedures: None ordered   Follow-Up: IN 2 MONTHS WITH DR [name]    Any Other Special Instructions Will Be Listed Below (If Applicable).     If you need a refill on your cardiac medications before your next appointment, please call your pharmacy.

## 2017-02-27 NOTE — Progress Notes (Signed)
Cardiology Office Note    Date:  02/27/2017   ID:  CARLETTA [name], DOB 08-Oct-1961, MRN [id number]  PCP:  Terald Sleeper, PA-C  Cardiologist: Dr. [name] EPS Dr. [name]  Chief Complaint  Patient presents with  . Follow-up    History of Present Illness:  [name] [name] is a 56 y.o. female  with history of nonischemic cardiomyopathy LVEF 30-35%, normal coronary arteries on cath 07/2016 hypertension, breast CA status post lumpectomy and radiation but refused chemotherapy. Last saw Dr. [name] 01/30/17 complaining of worsening dyspnea on exertion. She  Stopped Entresto b/c she developed abdominal pain which continued after stopping Entresto. She was willing to try taking it again. She was felt to be euvolemic Plan was for cardiopulmonary stress testing if she continued to have shortness of breath with minimal exertion.  Patient comes in today feeling well. She denies any further dyspnea on exertion. She seems to be tolerating Entresto fine although she had trouble getting it.    Past Medical History:  Diagnosis Date  . AICD (automatic cardioverter/defibrillator) present 09   [employer]- replaced 2016  . Arthritis   . Bradycardia   . Breast cancer ([place]) 02/24/16   Left breast cancer  . CHF (congestive heart failure) ([place])   . Cough due to angiotensin-converting enzyme inhibitor    But patient chooses to continue ACE  . Dizziness    Dizziness with standing after squatting, March, 2012  . Dyslipidemia    LDL elevation,Patient does not want statin  . Ejection fraction < 50%   . Family history of breast cancer   . GERD (gastroesophageal reflux disease)   . Hypothyroidism   . ICD (implantable cardiac defibrillator) battery depletion    Dr. [name], June, 2009(artifactual atrial tachycardia response events addressed by reprogramming in parentheses  . Itching   . Leg swelling    right leg  . Nonischemic cardiomyopathy ([place])    Etiology unknown, diagnosed 2008, patient has never had  catheterization or nuclear study as of March, 2011  . Pain    left leg & left knee  . Stroke ([place]) 07   tia  . TIA (transient ischemic attack)    No CT or MRI abnormality, aspirin therapy  . Weight gain     Past Surgical History:  Procedure Laterality Date  . BREAST LUMPECTOMY WITH RADIOACTIVE SEED AND SENTINEL LYMPH NODE BIOPSY Left 04/05/2016   Procedure: BREAST LUMPECTOMY WITH RADIOACTIVE SEED AND SENTINEL LYMPH NODE BIOPSY;  Surgeon: [name], MD;  Location: [facility];  Service: General;  Laterality: Left;  . CARDIAC CATHETERIZATION N/A 08/05/2016   Procedure: Left Heart Cath and Coronary Angiography;  Surgeon: Peter M Martinique, MD;  Location: [facility];  Service: Cardiovascular;  Laterality: N/A;  . CARDIAC DEFIBRILLATOR PLACEMENT  09   [employer] no remote  . COLONOSCOPY    . CYST EXCISION Left    back of leg-lt   . IMPLANTABLE CARDIOVERTER DEFIBRILLATOR GENERATOR CHANGE N/A 12/29/2014   Procedure: IMPLANTABLE CARDIOVERTER DEFIBRILLATOR GENERATOR CHANGE;  Surgeon: [name], MD;  Location: [facility] CATH LAB;  Service: Cardiovascular;  Laterality: N/A;  . LATERAL EPICONDYLE RELEASE  10/11/2012   Procedure: TENNIS ELBOW RELEASE;  Surgeon: [name], MD;  Location: [facility];  Service: Orthopedics;  Laterality: Right;  RIGHT ELBOW: TENOTOMY ELBOW LATERAL EPICONDYLITIS TENNIS ELBOW: RADIAL TUNNEL RELEASE  . PORT-A-CATH REMOVAL  04/13/2016   Procedure: MINOR REMOVAL PORT-A-CATH;  Surgeon: [name], MD;  Location: [facility];  Service: General;;  . PORTACATH PLACEMENT Right 04/05/2016   Procedure: INSERTION PORT-A-CATH WITH [place];  Surgeon: [name], MD;  Location: [facility];  Service: General;  Laterality: Right;  right IJ  . TOTAL THYROIDECTOMY  2001  . TUBAL LIGATION  94  . UPPER GI ENDOSCOPY      Current Medications: Outpatient Medications Prior to Visit  Medication Sig Dispense Refill  . aspirin EC 81 MG tablet Take 162 mg by  mouth daily.     . carvedilol (COREG) 12.5 MG tablet TAKE ONE TABLET BY MOUTH TWICE DAILY WITH A MEAL 60 tablet 5  . Cholecalciferol (VITAMIN D3) 2000 units TABS Take 2,000 Units by mouth daily.    [name] dicyclomine (BENTYL) 10 MG capsule Take 10 mg by mouth 2 (two) times daily.    [name] levothyroxine (SYNTHROID, LEVOTHROID) 88 MCG tablet Take 88 mcg by mouth daily.     [name] omeprazole (PRILOSEC) 20 MG capsule Take 20 mg by mouth daily.    . Probiotic Product (PROBIOTIC PO) Take 2 tablets by mouth daily.     . sacubitril-valsartan (ENTRESTO) 24-26 MG Take 1 tablet by mouth 2 (two) times daily. 60 tablet 3   No facility-administered medications prior to visit.      Allergies:   Avelox [moxifloxacin hcl in nacl]; Clindamycin/lincomycin; Kiwi extract; Bactrim [sulfamethoxazole-trimethoprim]; Levofloxacin; Moxifloxacin; Spironolactone; and Sulfa antibiotics   Social History   Social History  . Marital status: Single    Spouse name: N/A  . Number of children: N/A  . Years of education: N/A   Occupational History  . Widow    Social History Main Topics  . Smoking status: Never Smoker  . Smokeless tobacco: Never Used  . Alcohol use No  . Drug use: No  . Sexual activity: Not Asked   Other Topics Concern  . None   Social History Narrative  . None     Family History:  The patient's   family history includes Cancer in her maternal aunt, mother, and paternal grandmother; Heart attack in her mother; Hypertension in her brother and sister.   ROS:   Please see the history of present illness.    Review of Systems  Constitution: Negative.  HENT: Negative.   Eyes: Negative.   Cardiovascular: Negative.   Respiratory: Negative.   Hematologic/Lymphatic: Negative.   Musculoskeletal: Negative.  Negative for joint pain.  Gastrointestinal: Negative.   Genitourinary: Negative.   Neurological: Negative.    All other systems reviewed and are negative.   PHYSICAL EXAM:   VS:  BP 122/84 (BP Location:  Left Arm)   Pulse 76   Ht 5\' 8"  (1.727 m)   Wt 204 lb 9.6 oz (92.8 kg)   BMI 31.11 kg/m   Physical Exam  GEN: Well nourished, well developed, in no acute distress  Neck: no JVD, carotid bruits, or masses Cardiac:RRR; no murmurs, rubs, or gallops  Respiratory:  clear to auscultation bilaterally, normal work of breathing GI: soft, nontender, nondistended, + BS Ext: without cyanosis, clubbing, or edema, Good distal pulses bilaterally Neuro:  Alert and Oriented x 3 Psych: euthymic mood, full affect  Wt Readings from Last 3 Encounters:  02/27/17 204 lb 9.6 oz (92.8 kg)  01/30/17 210 lb (95.3 kg)  12/28/16 212 lb (96.2 kg)      Studies/Labs Reviewed:   EKG:  EKG is not ordered today.    Recent Labs: 08/02/2016: BUN 12; Creat 0.93; Hemoglobin 11.5; Platelets 176; Potassium 4.0; Sodium 144  Lipid Panel No results found for: CHOL, TRIG, HDL, CHOLHDL, VLDL, LDLCALC, LDLDIRECT  Additional studies/ records that were reviewed today include:   TTE: 11/2014  Impressions: - The EF on last echo 8/14 was reported as 45% but appears after my   review to be worse that that at 25-30%. The EF on current exam   appears to be 35-40%.   TTE: 12/08/2015   Left ventricle: The cavity size was mildly dilated. Wall   thickness was normal. Systolic function was moderately to   severely reduced. The estimated ejection fraction was in the   range of 30% to 35%. Diffuse hypokinesis. - Mitral valve: Mild prolapse, involving the anterior leaflet.   There was mild regurgitation.   Impressions: - Moderate to severe global reduction in LV systolic function;   grade 1 diastolic dysfunction; mild prolapse of anterior MV   leaflet with mild MR; mild TR. Cannot R/O apical thrombus;   suggest fu limited study with contrast to further assess.   Cardiac catheterization:08/05/2016    There is severe left ventricular systolic dysfunction.  LV end diastolic pressure is normal.  The left ventricular  ejection fraction is 25-35% by visual estimate.   1. Normal coronary anatomy 2. Severe LV dysfunction 3. Elevated LV EDP     2-D echo 06/2016 Study Conclusions   - Left ventricle: The cavity size was normal. Wall thickness was   normal. The estimated ejection fraction was 30%. Diffuse   hypokinesis. Doppler parameters are consistent with abnormal left   ventricular relaxation (grade 1 diastolic dysfunction). - Aortic valve: There was no stenosis. - Mitral valve: Flattened closure plane of the mitral valve without   frank prolapse. There was mild regurgitation. - Right ventricle: The cavity size was normal. Pacer wire or   catheter noted in right ventricle. Systolic function was mildly   reduced. - Tricuspid valve: Peak RV-RA gradient (S): 16 mm Hg. - Pulmonary arteries: PA peak pressure: 19 mm Hg (S). - Inferior vena cava: The vessel was normal in size. The   respirophasic diameter changes were in the normal range (>= 50%),   consistent with normal central venous pressure.   Impressions:   - Normal LV size with EF 30%, diffuse hypokinesis. Normal RV size   with mildly decreased systolic function. Mild MR.       ASSESSMENT:    1. Nonischemic cardiomyopathy ([place])   2. Chronic systolic CHF (congestive heart failure) ([place])   3. Dual implantable cardioverter-defibrillator in situ      PLAN:  In order of problems listed above:  Nonischemic cardiomyopathy LVEF 30% on last echo 06/2016. Tolerating low-dose Entresto. BP stable. Will increase dose to 49/51 mg BID-samples given. For BMET and BNP today. F/u with Dr. [name] in 2 months.   Chronic systolic CHF compensated without heart failure  Defibrillator without shock    Medication Adjustments/Labs and Tests Ordered: Current medicines are reviewed at length with the patient today.  Concerns regarding medicines are outlined above.  Medication changes, Labs and Tests ordered today are listed in the Patient Instructions  below. There are no Patient Instructions on file for this visit.   Sumner Boast, PA-C  02/27/2017 [facility] [place], [place], [place]  [address] Phone: [phone]; Fax: [phone]

## 2017-02-28 DIAGNOSIS — M9901 Segmental and somatic dysfunction of cervical region: Secondary | ICD-10-CM | POA: Diagnosis not present

## 2017-03-08 DIAGNOSIS — M9901 Segmental and somatic dysfunction of cervical region: Secondary | ICD-10-CM | POA: Diagnosis not present

## 2017-03-13 DIAGNOSIS — M9901 Segmental and somatic dysfunction of cervical region: Secondary | ICD-10-CM | POA: Diagnosis not present

## 2017-03-14 DIAGNOSIS — Z853 Personal history of malignant neoplasm of breast: Secondary | ICD-10-CM | POA: Diagnosis not present

## 2017-03-14 DIAGNOSIS — M545 Low back pain: Secondary | ICD-10-CM | POA: Diagnosis not present

## 2017-03-14 DIAGNOSIS — G8929 Other chronic pain: Secondary | ICD-10-CM | POA: Diagnosis not present

## 2017-03-16 ENCOUNTER — Ambulatory Visit (INDEPENDENT_AMBULATORY_CARE_PROVIDER_SITE_OTHER): Payer: BLUE CROSS/BLUE SHIELD | Admitting: *Deleted

## 2017-03-16 DIAGNOSIS — I428 Other cardiomyopathies: Secondary | ICD-10-CM

## 2017-03-16 NOTE — Progress Notes (Signed)
Remote ICD transmission.   

## 2017-03-17 ENCOUNTER — Encounter: Payer: Self-pay | Admitting: Cardiology

## 2017-03-21 LAB — CUP PACEART REMOTE DEVICE CHECK
Battery Remaining Longevity: 120 mo
Date Time Interrogation Session: 20180529120630
Implantable Lead Implant Date: 20090320
Implantable Lead Location: 753859
Implantable Lead Model: 5076
Lead Channel Setting Pacing Amplitude: 2 V
MDC IDC LEAD IMPLANT DT: 20090320
MDC IDC LEAD LOCATION: 753860
MDC IDC LEAD SERIAL: 182504
MDC IDC PG IMPLANT DT: 20160307
MDC IDC SET LEADCHNL RV PACING AMPLITUDE: 2.5 V
MDC IDC SET LEADCHNL RV PACING PULSEWIDTH: 0.4 ms
MDC IDC SET LEADCHNL RV SENSING SENSITIVITY: 0.6 mV
Pulse Gen Serial Number: 111826

## 2017-03-23 DIAGNOSIS — M9901 Segmental and somatic dysfunction of cervical region: Secondary | ICD-10-CM | POA: Diagnosis not present

## 2017-03-31 ENCOUNTER — Encounter: Payer: Self-pay | Admitting: Cardiology

## 2017-04-03 ENCOUNTER — Other Ambulatory Visit: Payer: Self-pay | Admitting: Cardiology

## 2017-04-03 DIAGNOSIS — I429 Cardiomyopathy, unspecified: Secondary | ICD-10-CM

## 2017-04-06 DIAGNOSIS — M9901 Segmental and somatic dysfunction of cervical region: Secondary | ICD-10-CM | POA: Diagnosis not present

## 2017-04-14 DIAGNOSIS — M549 Dorsalgia, unspecified: Secondary | ICD-10-CM | POA: Diagnosis not present

## 2017-04-19 DIAGNOSIS — M9903 Segmental and somatic dysfunction of lumbar region: Secondary | ICD-10-CM | POA: Diagnosis not present

## 2017-04-20 DIAGNOSIS — M9903 Segmental and somatic dysfunction of lumbar region: Secondary | ICD-10-CM | POA: Diagnosis not present

## 2017-04-24 DIAGNOSIS — M9903 Segmental and somatic dysfunction of lumbar region: Secondary | ICD-10-CM | POA: Diagnosis not present

## 2017-04-28 ENCOUNTER — Ambulatory Visit: Payer: BLUE CROSS/BLUE SHIELD | Admitting: Physician Assistant

## 2017-05-03 DIAGNOSIS — M9901 Segmental and somatic dysfunction of cervical region: Secondary | ICD-10-CM | POA: Diagnosis not present

## 2017-05-03 DIAGNOSIS — M9903 Segmental and somatic dysfunction of lumbar region: Secondary | ICD-10-CM | POA: Diagnosis not present

## 2017-05-09 ENCOUNTER — Ambulatory Visit (INDEPENDENT_AMBULATORY_CARE_PROVIDER_SITE_OTHER): Payer: BLUE CROSS/BLUE SHIELD | Admitting: Family

## 2017-05-09 ENCOUNTER — Encounter: Payer: Self-pay | Admitting: Family

## 2017-05-09 VITALS — BP 133/82 | HR 65 | Temp 97.7°F | Ht 68.0 in | Wt 209.8 lb

## 2017-05-09 DIAGNOSIS — J301 Allergic rhinitis due to pollen: Secondary | ICD-10-CM | POA: Diagnosis not present

## 2017-05-09 DIAGNOSIS — B359 Dermatophytosis, unspecified: Secondary | ICD-10-CM | POA: Diagnosis not present

## 2017-05-09 MED ORDER — FLUTICASONE PROPIONATE 50 MCG/ACT NA SUSP: 2.0000 | Freq: Every day | NASAL | 6 refills | Status: DC

## 2017-05-09 MED ORDER — KETOCONAZOLE 2 % EX CREA: 1.0000 "application " | TOPICAL_CREAM | Freq: Every day | CUTANEOUS | 0 refills | Status: DC

## 2017-05-09 NOTE — Patient Instructions (Signed)

## 2017-05-09 NOTE — Progress Notes (Signed)
   Subjective:    Patient ID: [name] [name], female    DOB: 05/07/1961, 56 y.o.   MRN: [id number]  Sinusitis  This is a new problem. The current episode started in the past 7 days. The problem has been gradually worsening since onset. There has been no fever. Her pain is at a severity of 0/10. She is experiencing no pain. Associated symptoms include congestion, coughing and headaches. Pertinent negatives include no ear pain, shortness of breath, sinus pressure, sneezing, sore throat or swollen glands. (Bilateral ears itching ) Past treatments include saline sprays (claritin). The treatment provided mild relief.  Rash  This is a new problem. The current episode started 1 to 4 weeks ago. The problem has been gradually worsening since onset. The affected locations include the left lower leg, right lower leg and right arm. The rash is characterized by itchiness and redness. Associated symptoms include congestion and coughing. Pertinent negatives include no shortness of breath or sore throat. Past treatments include topical steroids. The treatment provided no relief.      Review of Systems  HENT: Positive for congestion. Negative for ear pain, sinus pressure, sneezing and sore throat.   Respiratory: Positive for cough. Negative for shortness of breath.   Skin: Positive for rash.  Neurological: Positive for headaches.  All other systems reviewed and are negative.      Objective:   Physical Exam  Constitutional: She is oriented to person, place, and time. She appears well-developed and well-nourished. No distress.  HENT:  Head: Normocephalic and atraumatic.  Right Ear: External ear normal.  Left Ear: External ear normal.  Nose: Nose normal. Right sinus exhibits no maxillary sinus tenderness and no frontal sinus tenderness. Left sinus exhibits no maxillary sinus tenderness and no frontal sinus tenderness.  Mouth/Throat: Posterior oropharyngeal erythema present.  Eyes: Pupils are equal, round, and  reactive to light.  Neck: Normal range of motion. Neck supple. No thyromegaly present.  Cardiovascular: Normal rate, regular rhythm, normal heart sounds and intact distal pulses.   No murmur heard. Pulmonary/Chest: Effort normal and breath sounds normal. No respiratory distress. She has no wheezes.  Abdominal: Soft. Bowel sounds are normal. She exhibits no distension. There is no tenderness.  Musculoskeletal: Normal range of motion. She exhibits no edema or tenderness.  Neurological: She is alert and oriented to person, place, and time.  Skin: Skin is warm and dry. Rash noted.  Erythemas circular rash on lower legs with darker erythemas border   Psychiatric: She has a normal mood and affect. Her behavior is normal. Judgment and thought content normal.  Vitals reviewed.    BP 133/82   Pulse 65   Temp 97.7 F (36.5 C) (Oral)   Ht 5\' 8"  (1.727 m)   Wt 209 lb 12.8 oz (95.2 kg)   BMI 31.90 kg/m      Assessment & Plan:  1. Allergic rhinitis due to pollen, unspecified seasonality Start flonase and mucinex  Continue Claritin  Force fluids Call office if symptoms worsen or do not improve - fluticasone (FLONASE) 50 MCG/ACT nasal spray; Place 2 sprays into both nostrils daily.  Dispense: 16 g; Refill: 6  2. Ringworm Keep clean and dry  Do not scratch Good hand hygiene discussed  - ketoconazole (NIZORAL) 2 % cream; Apply 1 application topically daily.  Dispense: 15 g; Refill: 0   Evelina Dun, FNP

## 2017-05-10 DIAGNOSIS — R0789 Other chest pain: Secondary | ICD-10-CM | POA: Diagnosis not present

## 2017-05-10 DIAGNOSIS — I5020 Unspecified systolic (congestive) heart failure: Secondary | ICD-10-CM | POA: Diagnosis not present

## 2017-05-10 DIAGNOSIS — I110 Hypertensive heart disease with heart failure: Secondary | ICD-10-CM | POA: Diagnosis not present

## 2017-05-10 DIAGNOSIS — Z7982 Long term (current) use of aspirin: Secondary | ICD-10-CM | POA: Diagnosis not present

## 2017-05-10 DIAGNOSIS — R0602 Shortness of breath: Secondary | ICD-10-CM | POA: Diagnosis not present

## 2017-05-10 DIAGNOSIS — Z95810 Presence of automatic (implantable) cardiac defibrillator: Secondary | ICD-10-CM | POA: Diagnosis not present

## 2017-05-10 DIAGNOSIS — E039 Hypothyroidism, unspecified: Secondary | ICD-10-CM | POA: Diagnosis not present

## 2017-05-10 DIAGNOSIS — Z8673 Personal history of transient ischemic attack (TIA), and cerebral infarction without residual deficits: Secondary | ICD-10-CM | POA: Diagnosis not present

## 2017-05-10 DIAGNOSIS — Z79899 Other long term (current) drug therapy: Secondary | ICD-10-CM | POA: Diagnosis not present

## 2017-05-16 ENCOUNTER — Encounter: Payer: Self-pay | Admitting: Physician Assistant

## 2017-05-16 NOTE — Progress Notes (Signed)
Cardiology Office Note    Date:  05/17/2017  ID:  [name] [name], DOB 29-Sep-1961, MRN [id number] PCP:  Terald Sleeper, PA-C  Cardiologist:  Dr. [name]   Chief Complaint: f/u CHF  History of Present Illness:  [name] [name] is a 56 y.o. female with history of longstanding NICM (LVEF last 25-35% by cath 59/2924), chronic systolic CHF, prior [employer] ICD (replaced 2016), normal coronary arteries on cath 07/2016, breast CA (s/p lumpectomy and radiation but refused chemotherapy), dyslipidemia, GERD, hypothyroidism, prior TIA, multiple medication intolerances who presents for f/u. She last saw Dr. [name] 01/30/17 complaining of worsening dyspnea on exertion. She had stopped Entresto because she developed abdominal pain, although this continued after stopping Entresto so it was restarted in 02/2017. She was felt to be euvolemic, with plans to proceed with CPX if she continued to have sx. She saw Ermalinda Barrios PA-C 5/201 at which time she was feeling well thus no testing warranted. Device is followed by EP. Per notes, she previously did not tolerate spironolactone ("body burning") and could not afford Bidil. 2D echo 07/13/16 showed EF 30%, grade 1 Dd, flattened closure plane of mitral valve without frank prolapse, mild MR, RV function mildly reduced. Last labs 02/2017 showed normal BMET cr 0.97. Last Hgb was 11.5 in 07/2016.  She returns for follow-up today with multiple complaints. In general she has not felt well since radiation treatment last year. Over the last few months she reports occasionally feeling nauseated, lightheaded, sinus drainage with bad taste in her mouth, itching in her ears, low back pain on the side for which she's seen improvement with chiropractor, clearing her throat a lot, and poor sleep. She states she's not sure if it's her acid reflux flaring up. She reports she's had a raised circular rash and is being treated for ringworm with improvement. She states she was seen in the ER last Wednesday  due to feeling more SOB as if she was choking and needed to be placed on oxygen. She's not had any swelling of her mouth, lips or tongue. This comes at random times, also some DOE. She was told her EKG and CXR were good and was prescribed Flonase. She was reading about the side effects of this medicine and is concerned. She also felt a constant low grade sense of chest pressure which is not worse with exertion or inspiration, constant for several months, even perhaps since radiation. No LEE. No specific orthopnea. She reports generalized fatigue as well.   She states she was hoping she would come into this visit to be weaned off her heart medication. Favorite food is french fries, but she gets them non-salted. Does not follow weight at home.  Past Medical History:  Diagnosis Date  . AICD (automatic cardioverter/defibrillator) present 09   [employer]- replaced 2016  . Arthritis   . Bradycardia   . Breast cancer ([place]) 02/24/2016   s/p lumpectomy and radiation but refused chemotherapy  . Chronic systolic CHF (congestive heart failure) ([place])   . Cough due to angiotensin-converting enzyme inhibitor    But patient chooses to continue ACE  . Dizziness    Dizziness with standing after squatting, March, 2012  . Dyslipidemia    LDL elevation,Patient does not want statin  . Family history of breast cancer   . GERD (gastroesophageal reflux disease)   . Hypothyroidism   . ICD (implantable cardiac defibrillator) battery depletion    Dr. [name], June, 2009(artifactual atrial tachycardia response events addressed by reprogramming  in parentheses  . Leg swelling    right leg  . Nonischemic cardiomyopathy ([place])    Etiology unknown, diagnosed 2008, patient has never had catheterization or nuclear study as of March, 2011  . TIA (transient ischemic attack)    No CT or MRI abnormality, aspirin therapy    Past Surgical History:  Procedure Laterality Date  . BREAST BIOPSY Left 02/24/2016   U/S Core    . BREAST BIOPSY Left 02/24/2016   U/S Core  . BREAST LUMPECTOMY Left 04/05/2016  . BREAST LUMPECTOMY WITH RADIOACTIVE SEED AND SENTINEL LYMPH NODE BIOPSY Left 04/05/2016   Procedure: BREAST LUMPECTOMY WITH RADIOACTIVE SEED AND SENTINEL LYMPH NODE BIOPSY;  Surgeon: [name], MD;  Location: [facility];  Service: General;  Laterality: Left;  . CARDIAC CATHETERIZATION N/A 08/05/2016   Procedure: Left Heart Cath and Coronary Angiography;  Surgeon: Peter M Martinique, MD;  Location: [facility];  Service: Cardiovascular;  Laterality: N/A;  . CARDIAC DEFIBRILLATOR PLACEMENT  09   [employer] no remote  . COLONOSCOPY    . CYST EXCISION Left    back of leg-lt   . IMPLANTABLE CARDIOVERTER DEFIBRILLATOR GENERATOR CHANGE N/A 12/29/2014   Procedure: IMPLANTABLE CARDIOVERTER DEFIBRILLATOR GENERATOR CHANGE;  Surgeon: [name], MD;  Location: [facility] CATH LAB;  Service: Cardiovascular;  Laterality: N/A;  . LATERAL EPICONDYLE RELEASE  10/11/2012   Procedure: TENNIS ELBOW RELEASE;  Surgeon: [name], MD;  Location: [facility];  Service: Orthopedics;  Laterality: Right;  RIGHT ELBOW: TENOTOMY ELBOW LATERAL EPICONDYLITIS TENNIS ELBOW: RADIAL TUNNEL RELEASE  . PORT-A-CATH REMOVAL  04/13/2016   Procedure: MINOR REMOVAL PORT-A-CATH;  Surgeon: [name], MD;  Location: [facility];  Service: General;;  . PORTACATH PLACEMENT Right 04/05/2016   Procedure: INSERTION PORT-A-CATH WITH [place];  Surgeon: [name], MD;  Location: [facility];  Service: General;  Laterality: Right;  right IJ  . TOTAL THYROIDECTOMY  2001  . TUBAL LIGATION  94  . UPPER GI ENDOSCOPY      Current Medications: Current Meds  Medication Sig  . aspirin EC 81 MG tablet Take 162 mg by mouth daily.   . carvedilol (COREG) 12.5 MG tablet TAKE ONE TABLET BY MOUTH TWICE DAILY WITH  A  MEAL  . Cholecalciferol (VITAMIN D3) 2000 units TABS Take 2,000 Units by mouth daily.  . cyclobenzaprine (FLEXERIL) 10 MG  tablet Take 10 mg by mouth as needed for muscle spasms.   . fluticasone (FLONASE) 50 MCG/ACT nasal spray Place 2 sprays into both nostrils daily.  [name] ketoconazole (NIZORAL) 2 % cream Apply 1 application topically daily.  [name] levothyroxine (SYNTHROID, LEVOTHROID) 88 MCG tablet Take 88 mcg by mouth daily.   . Probiotic Product (PROBIOTIC PO) Take 2 tablets by mouth daily.   . ranitidine (ZANTAC) 150 MG tablet Take 150 mg by mouth 2 (two) times daily.  . sacubitril-valsartan (ENTRESTO) 49-51 MG Take 1 tablet by mouth 2 (two) times daily.     Allergies:   Avelox [moxifloxacin hcl in nacl]; Clindamycin/lincomycin; Kiwi extract; Bactrim [sulfamethoxazole-trimethoprim]; Levofloxacin; Moxifloxacin; Spironolactone; and Sulfa antibiotics   Social History   Social History  . Marital status: Single    Spouse name: N/A  . Number of children: N/A  . Years of education: N/A   Occupational History  . Widow    Social History Main Topics  . Smoking status: Never Smoker  . Smokeless tobacco: Never Used  . Alcohol use No  . Drug use: No  .  Sexual activity: Not Asked   Other Topics Concern  . None   Social History Narrative  . None     Family History:  Family History  Problem Relation Age of Onset  . Heart attack Mother   . Cancer Mother        breast  . Breast cancer Mother 64  . Hypertension Sister   . Hypertension Brother   . Cancer Maternal Aunt        breast  . Breast cancer Maternal Aunt   . Cancer Paternal Grandmother        possible cancer, unknown type  . Stroke Neg Hx   . Diabetes Neg Hx     ROS:   Please see the history of present illness. Positive in multiple ROS as above. All other systems are reviewed and otherwise negative.    PHYSICAL EXAM:   VS:  BP 130/82   Pulse 68   Ht 5\' 8"  (1.727 m)   Wt 208 lb 12.8 oz (94.7 kg)   BMI 31.75 kg/m   BMI: Body mass index is 31.75 kg/m. GEN: Well nourished, well developed well appearing AAF, in no acute distress  HEENT:  normocephalic, atraumatic Neck: no JVD, carotid bruits, or masses Cardiac: RRR; no murmurs, rubs, or gallops, no edema  Respiratory: mildly decreased BS at bases, normal work of breathing GI: soft, nontender, nondistended, + BS MS: no deformity or atrophy  Skin: warm and dry, no rash Neuro:  Alert and Oriented x 3, Strength and sensation are intact, follows commands Psych: euthymic mood, full affect  Wt Readings from Last 3 Encounters:  05/17/17 208 lb 12.8 oz (94.7 kg)  05/09/17 209 lb 12.8 oz (95.2 kg)  02/27/17 204 lb 9.6 oz (92.8 kg)      Studies/Labs Reviewed:   EKG:  EKG was ordered today and personally reviewed by me and demonstrates sinus bradycardia 52bpm, TWI III, V3-V4, TW flattening in avF, V5 - appear similar to multiple prior EKGs.  Recent Labs: 08/02/2016: Hemoglobin 11.5; Platelets 176 02/27/2017: BUN 14; Creatinine, Ser 0.97; NT-Pro BNP 427; Potassium 4.5; Sodium 142   Lipid Panel No results found for: CHOL, TRIG, HDL, CHOLHDL, VLDL, LDLCALC, LDLDIRECT  Additional studies/ records that were reviewed today include: Summarized above    ASSESSMENT & PLAN:   1. Shortness of breath/chest pressure - question due to fluid overload. She reports that workup at [facility] ER was unrevealing. I do not have those records available. Will repeat labs today to trend including lytes, CBC and BNP. Exam reveals decreased BS in bases. She has not been on standing diuretic. Note prior LVEDP was elevated by cath 07/2016. Weight is up 4lb from 02/2017. She has been unable to take spironolactone due to "body burning." Will add Lasix 20mg  daily. She is concerned about taking this due to being at work all day but is agreeable to low dose. She is not interested in further titrating HF meds today (states she was actually hoping we would be able to start weaning her off these). I discussed that CHF carries high mortality/morbidity and these meds would likely be lifelong. Also complicating  the picture are many other symptoms as noted above. Device interrogation done for her fatigue/intermittent lightheadedness showed no significant abnormalities (brief 6 beats of atrial tach but no sustained events or device malfunction). Optivol not available on her specific device. If symptoms do not improve with diuresis, would consider pulm eval given prior issues with allergies/sinuses. Could also consider CPX as  previously suggested by Dr. [name]. She is not tachycardic, tachypneic or hypoxic and has no signs of DVT on exam. Doubt ischemia given normal cors in 07/2016. 2. NICM/Chronic systolic CHF - as above. Discussed 2g sodium/2L fluid restriction. Also stressed importance of daily weights and notifying our office based on weight parameter increases. 3. Dyslipidemia - further monitoring per primary care. Does not appear to be followed in our office. 4. Obesity - given nonspecific fatigue, dyspnea, and obesity, would recommend sleep study to further exclude OSA.  Disposition: F/u with Dr. Rudi Coco team APP in 7-10 days. Would recommend arranging BMET at that time if she's been able to tolerate Lasix. Also has planned device f/u in system with recall for EP.    Medication Adjustments/Labs and Tests Ordered: Current medicines are reviewed at length with the patient today.  Concerns regarding medicines are outlined above. Medication changes, Labs and Tests ordered today are summarized above and listed in the Patient Instructions accessible in Encounters.   Signed, Charlie Pitter, PA-C  05/17/2017 8:22 AM    [facility] [address], [place], [place]  [address] Phone: [phone]; Fax: [phone]

## 2017-05-17 ENCOUNTER — Ambulatory Visit (INDEPENDENT_AMBULATORY_CARE_PROVIDER_SITE_OTHER): Payer: BLUE CROSS/BLUE SHIELD | Admitting: Physician Assistant

## 2017-05-17 ENCOUNTER — Encounter: Payer: Self-pay | Admitting: Physician Assistant

## 2017-05-17 ENCOUNTER — Ambulatory Visit (INDEPENDENT_AMBULATORY_CARE_PROVIDER_SITE_OTHER): Payer: BLUE CROSS/BLUE SHIELD | Admitting: *Deleted

## 2017-05-17 ENCOUNTER — Telehealth: Payer: Self-pay | Admitting: *Deleted

## 2017-05-17 VITALS — BP 130/82 | HR 68 | Ht 68.0 in | Wt 208.8 lb

## 2017-05-17 DIAGNOSIS — M62830 Muscle spasm of back: Secondary | ICD-10-CM | POA: Diagnosis not present

## 2017-05-17 DIAGNOSIS — R0602 Shortness of breath: Secondary | ICD-10-CM

## 2017-05-17 DIAGNOSIS — I428 Other cardiomyopathies: Secondary | ICD-10-CM

## 2017-05-17 DIAGNOSIS — I5022 Chronic systolic (congestive) heart failure: Secondary | ICD-10-CM

## 2017-05-17 DIAGNOSIS — E785 Hyperlipidemia, unspecified: Secondary | ICD-10-CM | POA: Diagnosis not present

## 2017-05-17 DIAGNOSIS — R079 Chest pain, unspecified: Secondary | ICD-10-CM | POA: Diagnosis not present

## 2017-05-17 DIAGNOSIS — M9901 Segmental and somatic dysfunction of cervical region: Secondary | ICD-10-CM | POA: Diagnosis not present

## 2017-05-17 DIAGNOSIS — R5383 Other fatigue: Secondary | ICD-10-CM

## 2017-05-17 DIAGNOSIS — E6609 Other obesity due to excess calories: Secondary | ICD-10-CM

## 2017-05-17 DIAGNOSIS — M9903 Segmental and somatic dysfunction of lumbar region: Secondary | ICD-10-CM | POA: Diagnosis not present

## 2017-05-17 DIAGNOSIS — M9902 Segmental and somatic dysfunction of thoracic region: Secondary | ICD-10-CM | POA: Diagnosis not present

## 2017-05-17 LAB — CUP PACEART INCLINIC DEVICE CHECK
HighPow Impedance: 49 Ohm
Implantable Lead Implant Date: 20090320
Implantable Lead Location: 753859
Implantable Lead Location: 753860
Implantable Lead Model: 5076
Implantable Pulse Generator Implant Date: 20160307
Lead Channel Impedance Value: 495 Ohm
Lead Channel Impedance Value: 526 Ohm
Lead Channel Pacing Threshold Amplitude: 0.9 V
Lead Channel Pacing Threshold Pulse Width: 0.4 ms
Lead Channel Sensing Intrinsic Amplitude: 1.4 mV
Lead Channel Setting Pacing Amplitude: 2 V
Lead Channel Setting Pacing Pulse Width: 0.4 ms
MDC IDC LEAD IMPLANT DT: 20090320
MDC IDC LEAD SERIAL: 182504
MDC IDC MSMT LEADCHNL RA PACING THRESHOLD AMPLITUDE: 0.4 V
MDC IDC MSMT LEADCHNL RA PACING THRESHOLD PULSEWIDTH: 0.4 ms
MDC IDC MSMT LEADCHNL RV SENSING INTR AMPL: 20.2 mV
MDC IDC SESS DTM: 20180725040000
MDC IDC SET LEADCHNL RV PACING AMPLITUDE: 2.5 V
MDC IDC SET LEADCHNL RV SENSING SENSITIVITY: 0.6 mV
Pulse Gen Serial Number: 111826

## 2017-05-17 LAB — BASIC METABOLIC PANEL
BUN / CREAT RATIO: 10 (ref 9–23)
BUN: 9 mg/dL (ref 6–24)
CALCIUM: 9.3 mg/dL (ref 8.7–10.2)
CO2: 26 mmol/L (ref 20–29)
CREATININE: 0.86 mg/dL (ref 0.57–1.00)
Chloride: 103 mmol/L (ref 96–106)
GFR calc Af Amer: 87 mL/min/{1.73_m2} (ref >59)
GFR calc non Af Amer: 75 mL/min/{1.73_m2} (ref >59)
GLUCOSE: 76 mg/dL (ref 65–99)
Potassium: 4.5 mmol/L (ref 3.5–5.2)
Sodium: 143 mmol/L (ref 134–144)

## 2017-05-17 LAB — MAGNESIUM: Magnesium: 2.2 mg/dL (ref 1.6–2.3)

## 2017-05-17 LAB — CBC
HEMATOCRIT: 34.2 % (ref 34.0–46.6)
HEMOGLOBIN: 11.5 g/dL (ref 11.1–15.9)
MCH: 29.9 pg (ref 26.6–33.0)
MCHC: 33.6 g/dL (ref 31.5–35.7)
MCV: 89 fL (ref 79–97)
Platelets: 206 10*3/uL (ref 150–379)
RBC: 3.85 x10E6/uL (ref 3.77–5.28)
RDW: 13 % (ref 12.3–15.4)
WBC: 5.2 10*3/uL (ref 3.4–10.8)

## 2017-05-17 LAB — TSH: TSH: 1.59 u[IU]/mL (ref 0.450–4.500)

## 2017-05-17 LAB — PRO B NATRIURETIC PEPTIDE: NT-PRO BNP: 284 pg/mL (ref 0–287)

## 2017-05-17 MED ORDER — FUROSEMIDE 20 MG PO TABS: 20.0000 mg | ORAL_TABLET | Freq: Every day | ORAL | 1 refills | Status: DC

## 2017-05-17 NOTE — Patient Instructions (Addendum)
Medication Instructions:  Your physician has recommended you make the following change in your medication:  1.  START Lasix 20 mg taking 1 tablet daily   Labwork: TODAY:  BMET, MAG, CBC, TSH, & PRO BNP  Testing/Procedures: Your physician has recommended that you have a sleep study. This test records several body functions during sleep, including: brain activity, eye movement, oxygen and carbon dioxide blood levels, heart rate and rhythm, breathing rate and rhythm, the flow of air through your mouth and nose, snoring, body muscle movements, and chest and belly movement.   Follow-Up: Your physician recommends that you schedule a follow-up appointment in: 7-10 [place] APP ON HER CARE TEAM    Any Other Special Instructions Will Be Listed Below (If Applicable).  For patients with congestive heart failure, we give them these special instructions:  1. Follow a low-salt diet - you are allowed no more than 2,000mg  of sodium per day. Watch your fluid intake. In general, you should not be taking in more than 2 liters of fluid per day (no more than 8 glasses per day). This includes sources of water in foods like soup, coffee, tea, milk, etc. 2. Weigh yourself on the same scale at same time of day and keep a log. 3. Call your doctor: (Anytime you feel any of the following symptoms)  - 3lb weight gain overnight or 5lb within a few days - Shortness of breath, with or without a dry hacking cough  - Swelling in the hands, feet or stomach  - If you have to sleep on extra pillows at night in order to breathe   IT IS IMPORTANT TO LET YOUR DOCTOR KNOW EARLY ON IF YOU ARE HAVING SYMPTOMS SO WE CAN HELP YOU!     Sleep Studies A sleep study (polysomnogram) is a series of tests done while you are sleeping. It can show how well you sleep. This can help your health care provider diagnose a sleep disorder and show how severe your sleep disorder is. A sleep study may lead to treatment that will  help you sleep better and prevent other medical problems caused by poor sleep. If you have a sleep disorder, you may also be at risk for:  Sleep-related accidents.  High blood pressure.  Heart disease.  Stroke.  Other medical conditions.  Sleep disorders are common. Your health care provider may suspect a sleep disorder if you:  Have loud snoring most nights.  Have brief periods when you stop breathing at night.  Feel sleepy on most days.  Fall asleep suddenly during the day.  Have trouble falling asleep or staying asleep.  Feel like you need to move your legs when trying to fall asleep.  Have dreams that seem very real shortly after falling asleep.  Feel like you cannot move when you first wake up.  Which tests will I need to have? Most sleep studies last all night and include these tests:  Recordings of your brain activity.  Recordings of your eye movements.  Recording of your heart rate and rhythm.  Blood pressure readings.  Readings of the amount of oxygen in your blood.  Measurements of your chest and belly movement as you breathe during sleep.  If you have signs of the sleep disorder called sleep apnea during your test, you may get a mask to wear for the second half of the night.  The mask provides continuous positive airway pressure (CPAP). This may improve sleep apnea significantly.  You will  then have all tests done again with the mask in place to see if your measurements and recordings change.  How are sleep studies done? Most sleep studies are done over one full night of sleep.  You will arrive at the study center in the evening and can go home in the morning.  Bring your pajamas and toothbrush.  Do not have caffeine on the day of your sleep study.  Your health care provider will let you know if you need to stop taking any of your regular medicines before the test.  To do the tests included in a polysomnogram, you will have:  Round, sticky  patches with sensors attached to recording wires (electrodes) placed on your scalp, face, chest, and limbs.  Wires from all the electrodes and sensors run from your bed to a computer. The wires can be taken off and put back on if you need to get out of bed to go to the bathroom.  A sensor placed over your nose to measure airflow.  A finger clip put on one finger to measure your blood oxygen level.  A belt around your belly and a belt around your chest to measure breathing movements.  Where are sleep studies done? Sleep studies are done at sleep centers. A sleep center may be inside a hospital, office, or clinic. The room where you have the study may look like a hospital room or a hotel room. The health care providers doing the study may come in and out of the room during the study. Most of the time, they will be in another room monitoring your test. How is information from sleep studies helpful? A polysomnogram can be used along with your medical history and a physical exam to diagnose conditions, such as:  Sleep apnea.  Restless legs syndrome.  Sleep-related seizure disorders.  Sleep-related movement disorders.  A medical doctor who specializes in sleep will evaluate your sleep study. The specialist will share the results with your primary health care provider. Treatments based on your sleep study may include:  Improving your sleep habits (sleep hygiene).  Wearing a CPAP mask.  Wearing an oral device at night to improve breathing and reduce snoring.  Taking medicine for: ? Restless legs syndrome. ? Sleep-related seizure disorder. ? Sleep-related movement disorder.  This information is not intended to replace advice given to you by your health care provider. Make sure you discuss any questions you have with your health care provider. Document Released: 04/16/2003 Document Revised: 06/05/2016 Document Reviewed: 12/16/2013 Elsevier Interactive Patient Education  [employer].   If you need a refill on your cardiac medications before your next appointment, please call your pharmacy.

## 2017-05-17 NOTE — Progress Notes (Signed)
add on- ICD check in clinic. Normal device function. Thresholds and sensing consistent with previous device measurements. Impedance trends stable over time. No evidence of any ventricular arrhythmias. 2 1:1 episodes, longest 6 beats. Histogram distribution appropriate for patient and level of activity. No changes made this session. Device programmed at appropriate safety margins. Device programmed to optimize intrinsic conduction. Estimated longevity 10.71yrs. Pt enrolled in remote follow-up. Remote check 10/24 and ROV recall with RU 11/2017

## 2017-05-17 NOTE — Telephone Encounter (Signed)
No sleep study ordered. Patient refused

## 2017-05-17 NOTE — Addendum Note (Signed)
Addended by: Gaetano Net on: 05/17/2017 08:59 AM   Modules accepted: Orders

## 2017-05-17 NOTE — Telephone Encounter (Signed)
-----   Message from Jeanann Lewandowsky, [place] sent at 05/17/2017  9:00 AM EDT ----- Disregard! Pt refused the sleep study.  If she decides to have it, she will call us

## 2017-05-18 ENCOUNTER — Telehealth: Payer: Self-pay | Admitting: Nurse Practitioner

## 2017-05-18 DIAGNOSIS — I428 Other cardiomyopathies: Secondary | ICD-10-CM

## 2017-05-18 NOTE — Telephone Encounter (Signed)
Reviewed lab results and plan of care with patient. She states she did note increased urination yesterday but has not taken lasix yet today. I advised her to call back in 1 week to give [place] an update on her symptoms. She states she has a feeling of pressure in her upper chest and esophagus. I reviewed Dayna Dunn's notes from her office visit yesterday and advised her to monitor symptoms of fluid overload and if the lasix improves those symptoms. I advised her to call back sooner if she has questions or concerns. She is scheduled for repeat bmet on Friday Aug. 3. She thanked me for the call.

## 2017-05-18 NOTE — Telephone Encounter (Signed)
-----   Message from Charlie Pitter, [place] sent at 05/18/2017  8:39 AM EDT ----- Please let patient know labs are normal. Etiology of her symptoms remain unclear, would continue with trial of Lasix as planned. I see the patient pushed her f/u out longer than recommended. Will need BMET in 1 week then since we started Lasix. If no improvement in sx on Lasix after 5-7 days would ask her to call us to place pulmonology referral. Melina Copa PA-C

## 2017-05-23 ENCOUNTER — Telehealth: Payer: Self-pay | Admitting: Physician Assistant

## 2017-05-23 NOTE — Telephone Encounter (Signed)
Yes, please place referral. Can you also see if you can get a hold of the report from recent Chest Xray at outside hospital and let me know what it said? Thank you. Zoye Chandra PA-C

## 2017-05-23 NOTE — Telephone Encounter (Signed)
Returned pts call and left her a message to call back. jw 05/23/17

## 2017-05-23 NOTE — Telephone Encounter (Signed)
Pt called to report that her symptoms, chest pressure and shortness of breath, are still the same, after starting Lasix.   In her o/v, it was noted that she would call and report and if it was no better, that we would refer her to Pulmonology.  I will check with Dayna to see if it is ok to go ahead and make that referral and to see if that is what we would do for the continued chest pressure?

## 2017-05-23 NOTE — Telephone Encounter (Signed)
New message    Pt c/o medication issue:  1. Name of Medication: furosemide (LASIX) 20 MG tablet  2. How are you currently taking this medication (dosage and times per day)? Take 1 tablet (20 mg total) by mouth daily.  3. Are you having a reaction (difficulty breathing--STAT)? No   4. What is your medication issue?  Patient states no changes- still having chest discomfort    Pt c/o of Chest Pain: STAT if CP now or developed within 24 hours  1. Are you having CP right now? Per patient - chest pressure   2. Are you experiencing any other symptoms (ex. SOB, nausea, vomiting, sweating)? No   3. How long have you been experiencing CP? Per patient - pressure for about 3 weeks now   4. Is your CP continuous or coming and going? Continuous    5. Have you taken Nitroglycerin? No  ?

## 2017-05-26 ENCOUNTER — Other Ambulatory Visit: Payer: BLUE CROSS/BLUE SHIELD | Admitting: *Deleted

## 2017-05-26 DIAGNOSIS — I428 Other cardiomyopathies: Secondary | ICD-10-CM | POA: Diagnosis not present

## 2017-05-26 LAB — BASIC METABOLIC PANEL
BUN / CREAT RATIO: 13 (ref 9–23)
BUN: 12 mg/dL (ref 6–24)
CHLORIDE: 104 mmol/L (ref 96–106)
CO2: 25 mmol/L (ref 20–29)
Calcium: 9.6 mg/dL (ref 8.7–10.2)
Creatinine, Ser: 0.92 mg/dL (ref 0.57–1.00)
GFR calc non Af Amer: 69 mL/min/{1.73_m2} (ref >59)
GFR, EST AFRICAN AMERICAN: 80 mL/min/{1.73_m2} (ref >59)
Glucose: 77 mg/dL (ref 65–99)
Potassium: 4.2 mmol/L (ref 3.5–5.2)
SODIUM: 144 mmol/L (ref 134–144)

## 2017-05-29 ENCOUNTER — Other Ambulatory Visit: Payer: Self-pay | Admitting: *Deleted

## 2017-05-29 DIAGNOSIS — R0602 Shortness of breath: Secondary | ICD-10-CM

## 2017-05-29 NOTE — Telephone Encounter (Signed)
Pt's dtr returning call regarding lab results-pls call [phone]

## 2017-06-06 ENCOUNTER — Other Ambulatory Visit: Payer: Self-pay | Admitting: Physician Assistant

## 2017-06-06 MED ORDER — LEVOTHYROXINE SODIUM 88 MCG PO TABS: 88.0000 ug | ORAL_TABLET | Freq: Every day | ORAL | 1 refills | Status: DC

## 2017-06-09 ENCOUNTER — Ambulatory Visit: Payer: BLUE CROSS/BLUE SHIELD | Admitting: Physician Assistant

## 2017-06-14 DIAGNOSIS — M9901 Segmental and somatic dysfunction of cervical region: Secondary | ICD-10-CM | POA: Diagnosis not present

## 2017-06-14 DIAGNOSIS — M62830 Muscle spasm of back: Secondary | ICD-10-CM | POA: Diagnosis not present

## 2017-06-14 DIAGNOSIS — M9902 Segmental and somatic dysfunction of thoracic region: Secondary | ICD-10-CM | POA: Diagnosis not present

## 2017-06-14 DIAGNOSIS — M9903 Segmental and somatic dysfunction of lumbar region: Secondary | ICD-10-CM | POA: Diagnosis not present

## 2017-06-28 ENCOUNTER — Ambulatory Visit (INDEPENDENT_AMBULATORY_CARE_PROVIDER_SITE_OTHER): Payer: BLUE CROSS/BLUE SHIELD | Admitting: Family Medicine

## 2017-06-28 ENCOUNTER — Encounter: Payer: Self-pay | Admitting: Family Medicine

## 2017-06-28 ENCOUNTER — Telehealth: Payer: Self-pay | Admitting: Family Medicine

## 2017-06-28 VITALS — BP 111/71 | HR 67 | Temp 97.9°F | Ht 68.0 in | Wt 210.0 lb

## 2017-06-28 DIAGNOSIS — N949 Unspecified condition associated with female genital organs and menstrual cycle: Secondary | ICD-10-CM | POA: Diagnosis not present

## 2017-06-28 DIAGNOSIS — L298 Other pruritus: Secondary | ICD-10-CM | POA: Diagnosis not present

## 2017-06-28 DIAGNOSIS — N898 Other specified noninflammatory disorders of vagina: Secondary | ICD-10-CM

## 2017-06-28 LAB — WET PREP FOR TRICH, YEAST, CLUE
CLUE CELL EXAM: NEGATIVE
TRICHOMONAS EXAM: NEGATIVE
YEAST EXAM: NEGATIVE

## 2017-06-28 NOTE — Assessment & Plan Note (Signed)
Left lumpectomy 04/05/2016: IDC grade 3, 2.3 cm, associated high-grade DCIS, margins negative, 0/1 lymph node, T2 N0 stage II a, ER 0%, PR 0%, HER-2 negative ratio 1.38 Patient refused adjuvant chemotherapy (patient fully understands that she is at very high risk of recurrence) Received adjuvant radiation from 05/03/2016 to 06/16/2016  Surveillance of breast cancer: 1. Breast exams 06/29/17: Benign 2. Annual mammograms 02/27/17: No abnormalities. Density cat C  Return to clinic in one year for surveillance checkups and follow-up

## 2017-06-28 NOTE — Progress Notes (Signed)
Subjective: VV:OHYWVPX symptoms PCP: Terald Sleeper, PA-C [name] [name] [name] [name] is a 56 y.o. female presenting to clinic today for:  1. Vaginal Discharge Patient reports that discharge started 3-4 days ago.  She notes that discharge appears thin and white.  No recent abx use. She denies vaginal odors.  She endorses vaginal pruritis and vaginal burning.  Denies abnormal vaginal bleeding, dysuria, hematuria, pelvic pain, nausea, vomiting, fevers, dyspareunia, post coital bleeding, sore throat, rashes.   She has changed toilet paper with no improvement.   Reports history of trichomonas with current partner about 3 years ago.  She is sexually active and does not use condoms.  Contraception: post menopausal.     Allergies  Allergen Reactions  . Avelox [Moxifloxacin Hcl In Nacl] Shortness Of Breath    Shortness of breath  . Clindamycin/Lincomycin Shortness Of Breath and Diarrhea    Breathing/diarrhea    . Kiwi Extract Anaphylaxis    Makes pt. Feel like her throat is closing   . Bactrim [Sulfamethoxazole-Trimethoprim] Swelling  . Levofloxacin Other (See Comments)    JOINT PAIN JOINT PAIN  . Moxifloxacin Other (See Comments)    Breathing problems Breathing problems  . Spironolactone Other (See Comments)    burning  . Sulfa Antibiotics Swelling   Past Medical History:  Diagnosis Date  . AICD (automatic cardioverter/defibrillator) present 09   [employer]- replaced 2016  . Arthritis   . Bradycardia   . Breast cancer ([place]) 02/24/2016   s/p lumpectomy and radiation but refused chemotherapy  . Chronic systolic CHF (congestive heart failure) ([place])   . Cough due to angiotensin-converting enzyme inhibitor    But patient chooses to continue ACE  . Dizziness    Dizziness with standing after squatting, March, 2012  . Dyslipidemia    LDL elevation,Patient does not want statin  . Family history of breast cancer   . GERD (gastroesophageal reflux disease)   . Hypothyroidism   . ICD  (implantable cardiac defibrillator) battery depletion    Dr. [name], June, 2009(artifactual atrial tachycardia response events addressed by reprogramming in parentheses  . Leg swelling    right leg  . Nonischemic cardiomyopathy ([place])    Etiology unknown, diagnosed 2008, patient has never had catheterization or nuclear study as of March, 2011  . TIA (transient ischemic attack)    No CT or MRI abnormality, aspirin therapy   Family History  Problem Relation Age of Onset  . Heart attack Mother   . Cancer Mother        breast  . Breast cancer Mother 57  . Hypertension Sister   . Hypertension Brother   . Cancer Maternal Aunt        breast  . Breast cancer Maternal Aunt   . Cancer Paternal Grandmother        possible cancer, unknown type  . Stroke Neg Hx   . Diabetes Neg Hx    Social Hx: non smoker.Current medications reviewed.   ROS: Per HPI  Objective: Office vital signs reviewed. BP 111/71 (BP Location: Right Arm)   Pulse 67   Temp 97.9 F (36.6 C) (Oral)   Ht 5\' 8"  (1.727 m)   Wt 210 lb (95.3 kg)   BMI 31.93 kg/m   Physical Examination:  General: Awake, alert, well nourished, No acute distress HEENT: Normal    Throat: moist mucus membranes, no erythema, no tonsillar exudate.  Airway is patent Cardio: regular rate Pulm: normal work of breathing on room air GI: soft, non-tender, non-distended,  bowel sounds present x4, no hepatomegaly, no splenomegaly, no masses GU: external vaginal tissue atrophic, cervix midline, no punctate lesions on cervix appreciated, moderate thin cream colored discharge from cervical os, no bleeding, no cervical motion tenderness, no abdominal/ adnexal masses  **Genital exam performed with the assistance of nurse Bullins.  No results found for this or any previous visit (from the past 24 hour(s)).  Assessment/ Plan: 56 y.o. female   1. Vaginal pruritus Patient with symptoms concerning for possible bacterial vaginosis versus candidal infection  versus STI. Discharge could be physiologic. She had no external vaginal lesions though vaginal mucosa was atrophic. Differential diagnosis could include vulvar lichen sclerosis or dermatitis secondary to semen. On exam, no odors were noted, making bacterial vaginosis less likely. Discharge did not appear typical white chunky discharge associated with yeast infection. She does have a history of trichomonas in the past. For this reason STI labs were obtained via swab. She declined HIV, RPR, hepatitis testing. Will contact patient by her home telephone number once results are available. She would like [place] to leave a voicemail on that phone number if she is not able to answer. - WET PREP FOR TRICH, YEAST, CLUE - GC/Chlamydia Probe Amp  2. Vaginal burning See above - GC/Chlamydia Probe Amp   Orders Placed This Encounter  Procedures  . GC/Chlamydia Probe Amp    Janora Norlander, DO [place] [phone]

## 2017-06-28 NOTE — Telephone Encounter (Signed)
Lm  Come by for wet prep

## 2017-06-28 NOTE — Patient Instructions (Addendum)
It was a pleasure meeting you today [name] [name]. I will contact you will the results of your labs.  If anything is abnormal, I will call you.    Healthy vaginal hygiene practices   -  Avoid sleeper pajamas. Nightgowns allow air to circulate.  Sleep without underpants whenever possible.  -  Wear cotton underpants during the day. Double-rinse underwear after washing to avoid residual irritants. Do not use fabric softeners for underwear and swimsuits.  - Avoid tights, leotards, leggings, "skinny" jeans, and other tight-fitting clothing. Skirts and loose-fitting pants allow air to circulate.  - Avoid pantyliners.  Instead use tampons or cotton pads.  - Daily warm bathing is helpful:     - Soak in clean water (no soap) for 10 to 15 minutes. Adding vinegar or baking soda to the water has not been specifically studied and may not be better than clean water alone.      - Use soap to wash regions other than the genital area just before getting out of the tub. Limit use of any soap on genital areas. Use fragance-free soaps.     - Rinse the genital area well and gently pat dry.  Don't rub.  Hair dryer to assist with drying can be used only if on cool setting.     - Do not use bubble baths or perfumed soaps.  - Do not use any feminine sprays, douches or powders.  These contain chemicals that will irritate the skin.  - If the genital area is tender or swollen, cool compresses may relieve the discomfort. Unscented wet wipes can be used instead of toilet paper for wiping.   - Emollients, such as Vaseline, may help protect skin and can be applied to the irritated area.  - Always remember to wipe front-to-back after bowel movements. Pat dry after urination.  - Do not sit in wet swimsuits for long periods of time after swimming

## 2017-06-29 ENCOUNTER — Ambulatory Visit (HOSPITAL_BASED_OUTPATIENT_CLINIC_OR_DEPARTMENT_OTHER): Payer: BLUE CROSS/BLUE SHIELD | Admitting: Hematology and Oncology

## 2017-06-29 DIAGNOSIS — C50212 Malignant neoplasm of upper-inner quadrant of left female breast: Secondary | ICD-10-CM

## 2017-06-29 DIAGNOSIS — Z171 Estrogen receptor negative status [ER-]: Secondary | ICD-10-CM | POA: Diagnosis not present

## 2017-06-29 LAB — GC/CHLAMYDIA PROBE AMP
Chlamydia trachomatis, NAA: NEGATIVE
NEISSERIA GONORRHOEAE BY PCR: NEGATIVE

## 2017-06-29 NOTE — Progress Notes (Signed)
Patient Care Team: Theodoro Clock as PCP - General (General Practice) Nicholas Lose, MD as Consulting Physician (Hematology and Oncology) Kyung Rudd, MD as Consulting Physician (Radiation Oncology) [name], MD as Consulting Physician (General Surgery)  DIAGNOSIS:  Encounter Diagnosis  Name Primary?  . Malignant neoplasm of upper-inner quadrant of left breast in female, estrogen receptor negative ([name] [name])     SUMMARY OF ONCOLOGIC HISTORY: Oncology History         Breast cancer of upper-inner quadrant of left female breast ([name] [name])   02/24/2016 Mammogram    Left breast mass 11:00 position 4 cm from nipple, 2.4 x 1.6 x 1.9 cm, single axillary lymph node noted, T2 N0 stage IIA clinical stage ([place])      02/24/2016 Initial Diagnosis    Moderate to poorly differentiated IDC, lymph node biopsy negative, ER 0%, PR 0%, HER-2, 2+ by IHC, fish negative, ratio 1.2, Ki-67 59%       04/05/2016 Surgery    Left lumpectomy [name]): IDC grade 3, 2.3 cm, associated high-grade DCIS, margins negative, 0/1 lymph node, T2 N0 stage II a, ER 0%, PR 0%, HER-2 negative ratio 1.38       Chemotherapy    Patient refused adjuvant chemotherapy      05/03/2016 - 06/16/2016 Radiation Therapy    Adjuvant radiation [name]). Left breast boost // 10 Gy with 5 fractions at a dose of 2 Gy/fraction.  Left breast// 50.4 Gy with 28 fractions at a dose of 1.8 Gy/ fraction      10/28/2016 Genetic Testing    Patient has genetic testing done for personal and family history of breast cancer. ATM [id number] VUS found on genetic testing.  The Breast/GYN gene panel offered by [facility] includes sequencing and rearrangement analysis for the following 23 genes:  ATM, BARD1, BRCA1, BRCA2, BRIP1, CDH1, CHEK2, EPCAM, FANCC, MLH1, MSH2, MSH6, MUTYH, NBN, NF1, PALB2, PMS2, POLD1, PTEN, RAD51C, RAD51D, RECQL, and TP53.   Negative genetic testing for the MSH2 inversion analysis ([name]).        CHIEF COMPLIANT:  Surveillance of breast cancer  INTERVAL HISTORY: [name] [name] is a 47 year with above-mentioned history left breast cancer treated with lumpectomy and radiation. She refused chemotherapy. She is currently on surveillance. She reports no new problems or concerns. She denies any lumps or nodules in the breasts.  REVIEW OF SYSTEMS:   Constitutional: Denies fevers, chills or abnormal weight loss Eyes: Denies blurriness of vision Ears, nose, mouth, throat, and face: Denies mucositis or sore throat Respiratory: Denies cough, dyspnea or wheezes Cardiovascular: Denies palpitation, chest discomfort Gastrointestinal:  Denies nausea, heartburn or change in bowel habits Skin: Denies abnormal skin rashes Lymphatics: Denies new lymphadenopathy or easy bruising Neurological:Denies numbness, tingling or new weaknesses Behavioral/Psych: Mood is stable, no new changes  Extremities: No lower extremity edema Breast:  denies any pain or lumps or nodules in either breasts All other systems were reviewed with the patient and are negative.  I have reviewed the past medical history, past surgical history, social history and family history with the patient and they are unchanged from previous note.  ALLERGIES:  is allergic to avelox [moxifloxacin hcl in nacl]; clindamycin/lincomycin; kiwi extract; bactrim [sulfamethoxazole-trimethoprim]; levofloxacin; moxifloxacin; spironolactone; and sulfa antibiotics.  MEDICATIONS:  Current Outpatient Prescriptions  Medication Sig Dispense Refill  . aspirin EC 81 MG tablet Take 162 mg by mouth daily.     . carvedilol (COREG) 12.5 MG tablet TAKE ONE TABLET BY MOUTH TWICE DAILY WITH  A  MEAL 180 tablet 3  . Cholecalciferol (VITAMIN D3) 2000 units TABS Take 2,000 Units by mouth daily.    . cyclobenzaprine (FLEXERIL) 10 MG tablet Take 10 mg by mouth as needed for muscle spasms.     . fluticasone (FLONASE) 50 MCG/ACT nasal spray Place 2 sprays into both nostrils daily. 16 g 6  .  furosemide (LASIX) 20 MG tablet Take 1 tablet (20 mg total) by mouth daily. 90 tablet 1  . levothyroxine (SYNTHROID, LEVOTHROID) 88 MCG tablet Take 1 tablet (88 mcg total) by mouth daily. 90 tablet 1  . omeprazole (PRILOSEC) 20 MG capsule Take 20 mg by mouth daily as needed.    . Probiotic Product (PROBIOTIC PO) Take 2 tablets by mouth daily.     . sacubitril-valsartan (ENTRESTO) 49-51 MG Take 1 tablet by mouth 2 (two) times daily. 60 tablet 3   No current facility-administered medications for this visit.     PHYSICAL EXAMINATION: ECOG PERFORMANCE STATUS: 1 - Symptomatic but completely ambulatory  There were no vitals filed for this visit. There were no vitals filed for this visit.  GENERAL:alert, no distress and comfortable SKIN: skin color, texture, turgor are normal, no rashes or significant lesions EYES: normal, Conjunctiva are pink and non-injected, sclera clear OROPHARYNX:no exudate, no erythema and lips, buccal mucosa, and tongue normal  NECK: supple, thyroid normal size, non-tender, without nodularity LYMPH:  no palpable lymphadenopathy in the cervical, axillary or inguinal LUNGS: clear to auscultation and percussion with normal breathing effort HEART: regular rate & rhythm and no murmurs and no lower extremity edema ABDOMEN:abdomen soft, non-tender and normal bowel sounds MUSCULOSKELETAL:no cyanosis of digits and no clubbing  NEURO: alert & oriented x 3 with fluent speech, no focal motor/sensory deficits EXTREMITIES: No lower extremity edema BREAST: No palpable masses or nodules in either right or left breasts. No palpable axillary supraclavicular or infraclavicular adenopathy no breast tenderness or nipple discharge. (exam performed in the presence of a chaperone)  LABORATORY DATA:  I have reviewed the data as listed   Chemistry      Component Value Date/Time   NA 144 05/26/2017 0924   K 4.2 05/26/2017 0924   CL 104 05/26/2017 0924   CO2 25 05/26/2017 0924   BUN 12  05/26/2017 0924   CREATININE 0.92 05/26/2017 0924   CREATININE 0.93 08/02/2016 1128      Component Value Date/Time   CALCIUM 9.6 05/26/2017 0924       Lab Results  Component Value Date   WBC 5.2 05/17/2017   HGB 11.5 05/17/2017   HCT 34.2 05/17/2017   MCV 89 05/17/2017   PLT 206 05/17/2017   NEUTROABS 1,989 08/02/2016    ASSESSMENT & PLAN:  Breast cancer of upper-inner quadrant of left female breast ([place]) Left lumpectomy 04/05/2016: IDC grade 3, 2.3 cm, associated high-grade DCIS, margins negative, 0/1 lymph node, T2 N0 stage II a, ER 0%, PR 0%, HER-2 negative ratio 1.38 Patient refused adjuvant chemotherapy (patient fully understands that she is at very high risk of recurrence) Received adjuvant radiation from 05/03/2016 to 06/16/2016  Surveillance of breast cancer: 1. Breast exams 06/29/17: Benign 2. Annual mammograms 02/27/17: No abnormalities. Density cat C  Return to clinic in one year for surveillance checkups and follow-up   I spent 25 minutes talking to the patient of which more than half was spent in counseling and coordination of care.  No orders of the defined types were placed in this encounter.  The patient has a good  understanding of the overall plan. she agrees with it. she will call with any problems that may develop before the next visit here.   Rulon Eisenmenger, MD 06/29/17

## 2017-07-04 ENCOUNTER — Ambulatory Visit (INDEPENDENT_AMBULATORY_CARE_PROVIDER_SITE_OTHER): Payer: BLUE CROSS/BLUE SHIELD | Admitting: Physician Assistant

## 2017-07-04 ENCOUNTER — Encounter: Payer: Self-pay | Admitting: Physician Assistant

## 2017-07-04 VITALS — BP 122/80 | HR 60 | Ht 68.0 in | Wt 213.1 lb

## 2017-07-04 DIAGNOSIS — I5022 Chronic systolic (congestive) heart failure: Secondary | ICD-10-CM

## 2017-07-04 DIAGNOSIS — Z95810 Presence of automatic (implantable) cardiac defibrillator: Secondary | ICD-10-CM

## 2017-07-04 MED ORDER — ASPIRIN EC 81 MG PO TBEC: 81.0000 mg | DELAYED_RELEASE_TABLET | Freq: Every day | ORAL | 3 refills | Status: DC

## 2017-07-04 NOTE — Patient Instructions (Signed)
Medication Instructions:  1. DECREASE ASPIRIN TO 81 MG DAILY  Labwork: NONE ORDERED TODAY  Testing/Procedures: NONE ORDERED TODAY  Follow-Up: Your physician wants you to follow-up in: 6 MONTHS WITH DR. Johann Capers will receive a reminder letter in the mail two months in advance. If you don't receive a letter, please call our office to schedule the follow-up appointment.   Any Other Special Instructions Will Be Listed Below (If Applicable).     If you need a refill on your cardiac medications before your next appointment, please call your pharmacy.

## 2017-07-04 NOTE — Progress Notes (Deleted)
Cardiology Office Note:    Date:  07/04/2017   ID:  [name] [name], DOB 1960-11-24, MRN [id number]  PCP:  [name] Sleeper, PA-C  Cardiologist:  [name] [name] Kitchen  Referring MD: [name] Sleeper, PA-C   No chief complaint on file. ***  History of Present Illness:    [name] [name] is a 56 y.o. female with a hx of nonischemic cardiomyopathy with EF previously 20-25%, bradycardia, hypothyroidism, HL, prior TIA, status post ICD for primary prevention. Echo 12/08: EF 30-35%. ***  [name] ***  Prior CV studies:   The following studies were reviewed today:  Cardiac Catheterization 08/05/16 Normal coronary arteries EF 25-35  Nuclear stress test 07/27/16 Intermediate risk stress nuclear study with mild inferior ischemia; EF 39 with global hypokinesis and moderate LVE.  Echocardiogram 07/13/16 EF 30, diffuse HK, grade 1 diastolic dysfunction, mild MR, mildly reduced RVSF, PASP 19  Echocardiogram 11/28/15  Echo 11/19/14  Echo 06/10/13  Past Medical History:  Diagnosis Date  . AICD (automatic cardioverter/defibrillator) present 09   [employer]- replaced 2016  . Arthritis   . Bradycardia   . Breast cancer ([place]) 02/24/2016   s/p lumpectomy and radiation but refused chemotherapy  . Chronic systolic CHF (congestive heart failure) ([place])   . Cough due to angiotensin-converting enzyme inhibitor    But patient chooses to continue ACE  . Dizziness    Dizziness with standing after squatting, March, 2012  . Dyslipidemia    LDL elevation,Patient does not want statin  . Family history of breast cancer   . GERD (gastroesophageal reflux disease)   . Hypothyroidism   . ICD (implantable cardiac defibrillator) battery depletion    Dr. Caryl [name], June, 2009(artifactual atrial tachycardia response events addressed by reprogramming in parentheses  . Leg swelling    right leg  . Nonischemic cardiomyopathy ([place])    Etiology unknown, diagnosed 2008, patient has never had catheterization or nuclear study as of  March, 2011  . TIA (transient ischemic attack)    No CT or MRI abnormality, aspirin therapy    Past Surgical History:  Procedure Laterality Date  . BREAST BIOPSY Left 02/24/2016   U/S Core  . BREAST BIOPSY Left 02/24/2016   U/S Core  . BREAST LUMPECTOMY Left 04/05/2016  . BREAST LUMPECTOMY WITH RADIOACTIVE SEED AND SENTINEL LYMPH NODE BIOPSY Left 04/05/2016   Procedure: BREAST LUMPECTOMY WITH RADIOACTIVE SEED AND SENTINEL LYMPH NODE BIOPSY;  Surgeon: [name] Overall, MD;  Location: [facility];  Service: General;  Laterality: Left;  . CARDIAC CATHETERIZATION N/A 08/05/2016   Procedure: Left Heart Cath and Coronary Angiography;  Surgeon: [name] M Martinique, MD;  Location: [facility];  Service: Cardiovascular;  Laterality: N/A;  . CARDIAC DEFIBRILLATOR PLACEMENT  09   [employer] no remote  . COLONOSCOPY    . CYST EXCISION Left    back of leg-lt   . IMPLANTABLE CARDIOVERTER DEFIBRILLATOR GENERATOR CHANGE N/A 12/29/2014   Procedure: IMPLANTABLE CARDIOVERTER DEFIBRILLATOR GENERATOR CHANGE;  Surgeon: [name] Sprang, MD;  Location[facility] CATH [facility];  Service: Cardiovascular;  Laterality: N/A;  . LATERAL EPICONDYLE RELEASE  10/11/2012   Procedure: TENNIS ELBOW RELEASE;  Surgeon: [name] Lights, MD;  Location: [facility];  Service: Orthopedics;  Laterality: Right;  RIGHT ELBOW: TENOTOMY ELBOW LATERAL EPICONDYLITIS TENNIS ELBOW: RADIAL TUNNEL RELEASE  . PORT-A-CATH REMOVAL  04/13/2016   Procedure: MINOR REMOVAL PORT-A-CATH;  Surgeon: [name], MD;  Location: [facility];  Service: General;;  . PORTACATH PLACEMENT  Right 04/05/2016   Procedure: INSERTION PORT-A-CATH WITH [place];  Surgeon: [name] Overall, MD;  Location: [facility];  Service: General;  Laterality: Right;  right IJ  . TOTAL THYROIDECTOMY  2001  . TUBAL LIGATION  94  . UPPER GI ENDOSCOPY      Current Medications: No outpatient prescriptions have been marked as taking for the 07/04/17 encounter (Appointment) with  [name] [name] T, PA-C.     Allergies:   Avelox [moxifloxacin hcl in nacl]; Clindamycin/lincomycin; Kiwi extract; Bactrim [sulfamethoxazole-trimethoprim]; Levofloxacin; Moxifloxacin; Spironolactone; and Sulfa antibiotics   Social History   Social History  . Marital status: Single    Spouse name: N/A  . Number of children: N/A  . Years of education: N/A   Occupational History  . Widow    Social History Main Topics  . Smoking status: Never Smoker  . Smokeless tobacco: Never Used  . Alcohol use No  . Drug use: No  . Sexual activity: Not on file   Other Topics Concern  . Not on file   Social History Narrative  . No narrative on file     Family Hx: The patient's family history includes Breast cancer in her maternal aunt; Breast cancer (age of onset: 60) in her mother; Cancer in her maternal aunt, mother, and paternal grandmother; Heart attack in her mother; Hypertension in her brother and sister. There is no history of Stroke or Diabetes.  ROS:   Please see the history of present illness.    ROS All other systems reviewed and are negative.   EKGs/Labs/Other Test Reviewed:    EKG:  EKG is *** ordered today.  The ekg ordered today demonstrates ***  Recent Labs: 05/17/2017: Hemoglobin 11.5; Magnesium 2.2; NT-Pro BNP 284; Platelets 206; TSH 1.590 05/26/2017: BUN 12; Creatinine, Ser 0.92; Potassium 4.2; Sodium 144   Recent Lipid Panel No results found for: CHOL, TRIG, HDL, CHOLHDL, LDLCALC, LDLDIRECT  Physical Exam:    VS:  There were no vitals taken for this visit.    Wt Readings from Last 3 Encounters:  06/29/17 213 lb (96.6 kg)  06/28/17 210 lb (95.3 kg)  05/17/17 208 lb 12.8 oz (94.7 kg)     ***Physical Exam  ASSESSMENT:    No diagnosis found. PLAN:    In order of problems listed above:  No diagnosis found.***  Dispo:  No Follow-up on file.   Medication Adjustments/Labs and Tests Ordered: Current medicines are reviewed at length with the patient today.   Concerns regarding medicines are outlined above.  Tests Ordered: No orders of the defined types were placed in this encounter.  Medication Changes: No orders of the defined types were placed in this encounter.   Signed, [name] Dopp, PA-C  07/04/2017 8:09 AM    [facility] [address], [place], [place]  [address] Phone: [phone]; Fax: [phone]

## 2017-07-04 NOTE — Progress Notes (Signed)
Cardiology Office Note:    Date:  07/04/2017   ID:  [name] [name], DOB 11-01-1960, MRN [id number]  PCP:  Terald Sleeper, PA-C  Cardiologist:  Dr. Ena Dawley   Electrophysiologist: Dr. [name]     Referring MD: Terald Sleeper, PA-C   Chief Complaint  Patient presents with  . Congestive Heart Failure    follow up    History of Present Illness:    [name] [name] is a 56 y.o. female with a hx of nonischemic cardiomyopathy with EF previously 20-25%,Systolic HF, bradycardia, hypothyroidism, HL, prior TIA, status post ICD for primary prevention, breast CA status post lumpectomy and radiation, prior TIA, multiple medication intolerances (Spironolactone caused "body burning," could not afford BiDil, questionable abdominal pain with Entresto). Last seen by Melina Copa, PA-C in 7/18.  She had recently been to the emergency room at [facility] for worsening shortness of breath.  Lasix was added due to suspected volume excess.  CPX testing has been considered in the past due to issues with shortness of breath.  The patient did call in shortly after her appointment and noted continued chest pain and shortness of breath.  She was set up for a CPX test and referral to Pulmonology.   [name] [name] returns for follow up.  She is here alone.  She notes improved breathing.  She works at [employer].  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  This work is fairly physical.  She denies significant dyspnea on exertion with this.  She denies any further chest pain.  She feels that the Lasix did help.  She denies paroxysmal nocturnal dyspnea, edema, syncope, ICD discharges.    Prior CV studies:   The following studies were reviewed today:  Cardiac Catheterization 08/05/16 Normal coronary arteries EF 25-35  Nuclear stress test 07/27/16 Intermediate risk stress nuclear study with mild inferior ischemia; EF 39 with global hypokinesis and moderate LVE.  Echocardiogram  07/13/16 EF 30, diffuse HK, grade 1 diastolic dysfunction, mild MR, mildly reduced RVSF, PASP 19  Echocardiogram 11/28/15 EF 30-35, diffuse HK, mild MVP involving anterior leaflet, mild MR  Echo 11/19/14 EF 35-40, diffuse HK, grade 1 diastolic dysfunction, MVP with mild MR, mildly reduced RVSF  Echo 06/10/13 Mild LVH, EF 45   Past Medical History:  Diagnosis Date  . AICD (automatic cardioverter/defibrillator) present 09   [employer]- replaced 2016  . Arthritis   . Bradycardia   . Breast cancer ([place]) 02/24/2016   s/p lumpectomy and radiation but refused chemotherapy  . Chronic systolic CHF (congestive heart failure) ([place])   . Cough due to angiotensin-converting enzyme inhibitor    But patient chooses to continue ACE  . Dizziness    Dizziness with standing after squatting, March, 2012  . Dyslipidemia    LDL elevation,Patient does not want statin  . Family history of breast cancer   . GERD (gastroesophageal reflux disease)   . Hypothyroidism   . ICD (implantable cardiac defibrillator) battery depletion    Dr. [name], June, 2009(artifactual atrial tachycardia response events addressed by reprogramming in parentheses  . Leg swelling    right leg  . Nonischemic cardiomyopathy ([place])    Etiology unknown, diagnosed 2008, patient has never had catheterization or nuclear study as of March, 2011  . TIA (transient ischemic attack)    No CT or MRI abnormality, aspirin therapy    Past Surgical History:  Procedure Laterality Date  . BREAST BIOPSY Left 02/24/2016  U/S Core  . BREAST BIOPSY Left 02/24/2016   U/S Core  . BREAST LUMPECTOMY Left 04/05/2016  . BREAST LUMPECTOMY WITH RADIOACTIVE SEED AND SENTINEL LYMPH NODE BIOPSY Left 04/05/2016   Procedure: BREAST LUMPECTOMY WITH RADIOACTIVE SEED AND SENTINEL LYMPH NODE BIOPSY;  Surgeon: [name], MD;  Location: [facility];  Service: General;  Laterality: Left;  . CARDIAC CATHETERIZATION N/A 08/05/2016   Procedure: Left Heart Cath and  Coronary Angiography;  Surgeon: Peter M Martinique, MD;  Location: [place] [facility];  Service: Cardiovascular;  Laterality: N/A;  . CARDIAC DEFIBRILLATOR PLACEMENT  09   [employer] no remote  . COLONOSCOPY    . CYST EXCISION Left    back of leg-lt   . IMPLANTABLE CARDIOVERTER DEFIBRILLATOR GENERATOR CHANGE N/A 12/29/2014   Procedure: IMPLANTABLE CARDIOVERTER DEFIBRILLATOR GENERATOR CHANGE;  Surgeon: [name], MD;  Location: [facility] CATH [facility];  Service: Cardiovascular;  Laterality: N/A;  . LATERAL EPICONDYLE RELEASE  10/11/2012   Procedure: TENNIS ELBOW RELEASE;  Surgeon: [name], MD;  Location: [place];  Service: Orthopedics;  Laterality: Right;  RIGHT ELBOW: TENOTOMY ELBOW LATERAL EPICONDYLITIS TENNIS ELBOW: RADIAL TUNNEL RELEASE  . PORT-A-CATH REMOVAL  04/13/2016   Procedure: MINOR REMOVAL PORT-A-CATH;  Surgeon: [name], MD;  Location: [place];  Service: General;;  . PORTACATH PLACEMENT Right 04/05/2016   Procedure: INSERTION PORT-A-CATH WITH [place];  Surgeon: [name], MD;  Location: [place];  Service: General;  Laterality: Right;  right IJ  . TOTAL THYROIDECTOMY  2001  . TUBAL LIGATION  94  . UPPER GI ENDOSCOPY      Current Medications: Current Meds  Medication Sig  . carvedilol (COREG) 12.5 MG tablet TAKE ONE TABLET BY MOUTH TWICE DAILY WITH  A  MEAL  . Cholecalciferol (VITAMIN D3) 2000 units TABS Take 2,000 Units by mouth daily.  . cyclobenzaprine (FLEXERIL) 10 MG tablet Take 10 mg by mouth as needed for muscle spasms.   . fluticasone (FLONASE) 50 MCG/ACT nasal spray Place 2 sprays into both nostrils daily.  . furosemide (LASIX) 20 MG tablet Take 1 tablet (20 mg total) by mouth daily.  [name] levothyroxine (SYNTHROID, LEVOTHROID) 88 MCG tablet Take 1 tablet (88 mcg total) by mouth daily.  [name] omeprazole (PRILOSEC) 20 MG capsule Take 20 mg by mouth daily as needed (HEARTBURN).   . Probiotic Product (PROBIOTIC PO) Take 2 tablets by mouth  daily.   . sacubitril-valsartan (ENTRESTO) 49-51 MG Take 1 tablet by mouth 2 (two) times daily.  . [DISCONTINUED] aspirin EC 81 MG tablet Take 162 mg by mouth daily.      Allergies:   Avelox [moxifloxacin hcl in nacl]; Clindamycin/lincomycin; Kiwi extract; Bactrim [sulfamethoxazole-trimethoprim]; Levofloxacin; Moxifloxacin; Spironolactone; and Sulfa antibiotics   Social History   Social History  . Marital status: Single    Spouse name: N/A  . Number of children: N/A  . Years of education: N/A   Occupational History  . Widow    Social History Main Topics  . Smoking status: Never Smoker  . Smokeless tobacco: Never Used  . Alcohol use No  . Drug use: No  . Sexual activity: Not Asked   Other Topics Concern  . None   Social History Narrative  . None     Family Hx: The patient's family history includes Breast cancer in her maternal aunt; Breast cancer (age of onset: 54) in her mother; Cancer in her maternal aunt, mother, and paternal grandmother; Heart attack in her mother; Hypertension  in her brother and sister. There is no history of Stroke or Diabetes.  ROS:   Please see the history of present illness.    ROS All other systems reviewed and are negative.   EKGs/Labs/Other Test Reviewed:    EKG:  EKG is  ordered today.  The ekg ordered today demonstrates NSR, HR 60, normal axis, NSSTTW changes, QTc 424 ms  Recent Labs: 05/17/2017: Hemoglobin 11.5; Magnesium 2.2; NT-Pro BNP 284; Platelets 206; TSH 1.590 05/26/2017: BUN 12; Creatinine, Ser 0.92; Potassium 4.2; Sodium 144   Recent Lipid Panel No results found for: CHOL, TRIG, HDL, CHOLHDL, LDLCALC, LDLDIRECT  Physical Exam:    VS:  BP 122/80   Pulse 60   Ht 5\' 8"  (1.727 m)   Wt 213 lb 1.9 oz (96.7 kg)   BMI 32.40 kg/m     Wt Readings from Last 3 Encounters:  07/04/17 213 lb 1.9 oz (96.7 kg)  06/29/17 213 lb (96.6 kg)  06/28/17 210 lb (95.3 kg)     Physical Exam  Constitutional: She is oriented to person, place,  and time. She appears well-developed and well-nourished. No distress.  HENT:  Head: Normocephalic and atraumatic.  Eyes: No scleral icterus.  Neck: No JVD present.  Cardiovascular: Normal rate and regular rhythm.   No murmur heard. Pulmonary/Chest: She has no rales.  Abdominal: There is no tenderness.  Musculoskeletal: She exhibits no edema.  Neurological: She is alert and oriented to person, place, and time.  Skin: Skin is warm and dry.  Psychiatric: She has a normal mood and affect.    ASSESSMENT:    1. Chronic systolic CHF (congestive heart failure) ([place])   2. Dual implantable cardioverter-defibrillator in situ    PLAN:    In order of problems listed above:  1. Chronic systolic CHF (congestive heart failure) ([facility])  NICM.  EF ~30.  Cardiac Catheterization in 2017 demonstrated normal coronary arteries.  She has had recurrent issues with shortness of breath.  However, since starting on low dose Furosemide, she has felt better.  She is NYHA 2.  We can hold off on CPX testing or referral to Pulmonology.  This can be considered in the future if needed.  She is on ASA for prior hx of TIA.  There is no reason to be on more than 81 mg.  Ok to reduce to ASA 81 mg QD.  Continue Entresto, Carvedilol.    2. Dual implantable cardioverter-defibrillator in situ FU with EP as planned.   Dispo:  Return in about 6 months (around 01/01/2018) for Routine Follow Up w/ Dr. [name].   Medication Adjustments/Labs and Tests Ordered: Current medicines are reviewed at length with the patient today.  Concerns regarding medicines are outlined above.  Tests Ordered: Orders Placed This Encounter  Procedures  . EKG 12-Lead   Medication Changes: Meds ordered this encounter  Medications  . aspirin EC 81 MG tablet    Sig: Take 1 tablet (81 mg total) by mouth daily.    Dispense:  90 tablet    Refill:  3    Order Specific Question:   Supervising Provider    Answer:   Thayer Headings [[id number]]     Signed, Richardson Dopp, PA-C  07/04/2017 12:37 PM    [facility] [place], [place], [place]  [address] Phone: [phone]; Fax: [phone]

## 2017-07-12 ENCOUNTER — Ambulatory Visit (INDEPENDENT_AMBULATORY_CARE_PROVIDER_SITE_OTHER): Payer: BLUE CROSS/BLUE SHIELD | Admitting: Physician Assistant

## 2017-07-12 ENCOUNTER — Encounter: Payer: Self-pay | Admitting: Physician Assistant

## 2017-07-12 VITALS — BP 101/64 | HR 68 | Temp 97.6°F | Ht 68.0 in | Wt 211.0 lb

## 2017-07-12 DIAGNOSIS — J0100 Acute maxillary sinusitis, unspecified: Secondary | ICD-10-CM | POA: Diagnosis not present

## 2017-07-12 DIAGNOSIS — J40 Bronchitis, not specified as acute or chronic: Secondary | ICD-10-CM | POA: Diagnosis not present

## 2017-07-12 MED ORDER — DOXYCYCLINE HYCLATE 100 MG PO TABS: 100.0000 mg | ORAL_TABLET | Freq: Two times a day (BID) | ORAL | 0 refills | Status: DC

## 2017-07-12 MED ORDER — PREDNISONE 10 MG (21) PO TBPK: ORAL_TABLET | ORAL | 0 refills | Status: DC

## 2017-07-12 MED ORDER — HYDROCODONE-HOMATROPINE 5-1.5 MG/5ML PO SYRP: 5.0000 mL | ORAL_SOLUTION | Freq: Four times a day (QID) | ORAL | 0 refills | Status: DC | PRN

## 2017-07-12 NOTE — Patient Instructions (Signed)
In a few days you may receive a survey in the mail or online from [employer] regarding your visit with us today. Please take a moment to fill this out. Your feedback is very important to our whole office. It can help us better understand your needs as well as improve your experience and satisfaction. Thank you for taking your time to complete it. We care about you.  Sabra Sessler, PA-C  

## 2017-07-12 NOTE — Progress Notes (Signed)
BP 101/64   Pulse 68   Temp 97.6 F (36.4 C) (Oral)   Ht 5\' 8"  (1.727 m)   Wt 211 lb (95.7 kg)   BMI 32.08 kg/m    Subjective:    Patient ID: [name] [name], female    DOB: March 30, 1961, 56 y.o.   MRN: [id number]  HPI: [name] [name] is a 56 y.o. female presenting on 07/12/2017 for Sinus Problem (drainage down throat, pressure in face) and Headache  This patient has had many days of sinus headache and postnasal drainage. There is copious drainage at times. Denies any fever at this time. There has been a history of sinus infections in the past.  No history of sinus surgery. Over the past week she has also had significant amount of cough and wheezing at times. She has had bronchitis also in the past. It is not productive at this time. She does have a significant amount of fatigue and chills at times.  Relevant past medical, surgical, family and social history reviewed and updated as indicated. Allergies and medications reviewed and updated.  Past Medical History:  Diagnosis Date  . AICD (automatic cardioverter/defibrillator) present 09   [employer]- replaced 2016  . Arthritis   . Bradycardia   . Breast cancer ([place]) 02/24/2016   s/p lumpectomy and radiation but refused chemotherapy  . Chronic systolic CHF (congestive heart failure) ([place])   . Cough due to angiotensin-converting enzyme inhibitor    But patient chooses to continue ACE  . Dizziness    Dizziness with standing after squatting, March, 2012  . Dyslipidemia    LDL elevation,Patient does not want statin  . Family history of breast cancer   . GERD (gastroesophageal reflux disease)   . Hypothyroidism   . ICD (implantable cardiac defibrillator) battery depletion    Dr. [name], June, 2009(artifactual atrial tachycardia response events addressed by reprogramming in parentheses  . Leg swelling    right leg  . Nonischemic cardiomyopathy ([place])    Etiology unknown, diagnosed 2008, patient has never had catheterization or nuclear  study as of March, 2011  . TIA (transient ischemic attack)    No CT or MRI abnormality, aspirin therapy    Past Surgical History:  Procedure Laterality Date  . BREAST BIOPSY Left 02/24/2016   U/S Core  . BREAST BIOPSY Left 02/24/2016   U/S Core  . BREAST LUMPECTOMY Left 04/05/2016  . BREAST LUMPECTOMY WITH RADIOACTIVE SEED AND SENTINEL LYMPH NODE BIOPSY Left 04/05/2016   Procedure: BREAST LUMPECTOMY WITH RADIOACTIVE SEED AND SENTINEL LYMPH NODE BIOPSY;  Surgeon: [name], MD;  Location: [place];  Service: General;  Laterality: Left;  . CARDIAC CATHETERIZATION N/A 08/05/2016   Procedure: Left Heart Cath and Coronary Angiography;  Surgeon: Peter M Martinique, MD;  Location: [place] [facility];  Service: Cardiovascular;  Laterality: N/A;  . CARDIAC DEFIBRILLATOR PLACEMENT  09   [employer] no remote  . COLONOSCOPY    . CYST EXCISION Left    back of leg-lt   . IMPLANTABLE CARDIOVERTER DEFIBRILLATOR GENERATOR CHANGE N/A 12/29/2014   Procedure: IMPLANTABLE CARDIOVERTER DEFIBRILLATOR GENERATOR CHANGE;  Surgeon: [name], MD;  Location: [facility] CATH LAB;  Service: Cardiovascular;  Laterality: N/A;  . LATERAL EPICONDYLE RELEASE  10/11/2012   Procedure: TENNIS ELBOW RELEASE;  Surgeon: [name], MD;  Location: [place];  Service: Orthopedics;  Laterality: Right;  RIGHT ELBOW: TENOTOMY ELBOW LATERAL EPICONDYLITIS TENNIS ELBOW: RADIAL TUNNEL RELEASE  . PORT-A-CATH REMOVAL  04/13/2016  Procedure: MINOR REMOVAL PORT-A-CATH;  Surgeon: [name], MD;  Location: [place];  Service: General;;  . PORTACATH PLACEMENT Right 04/05/2016   Procedure: INSERTION PORT-A-CATH WITH [place];  Surgeon: [name], MD;  Location: [place];  Service: General;  Laterality: Right;  right IJ  . TOTAL THYROIDECTOMY  2001  . TUBAL LIGATION  94  . UPPER GI ENDOSCOPY      Review of Systems  Constitutional: Positive for activity change, chills and fatigue. Negative for appetite  change and fever.  HENT: Positive for congestion, postnasal drip, sinus pain, sinus pressure and sore throat.   Eyes: Negative.   Respiratory: Positive for cough and wheezing. Negative for shortness of breath.   Cardiovascular: Negative.  Negative for chest pain, palpitations and leg swelling.  Gastrointestinal: Negative.   Genitourinary: Negative.   Musculoskeletal: Negative.   Skin: Negative.   Neurological: Positive for headaches.    Allergies as of 07/12/2017      Reactions   Avelox [moxifloxacin Hcl In Nacl] Shortness Of Breath   Shortness of breath   Clindamycin/lincomycin Shortness Of Breath, Diarrhea   Breathing/diarrhea   Kiwi Extract Anaphylaxis   Makes pt. Feel like her throat is closing   Bactrim [sulfamethoxazole-trimethoprim] Swelling   Levofloxacin Other (See Comments)   JOINT PAIN JOINT PAIN   Moxifloxacin Other (See Comments)   Breathing problems Breathing problems   Spironolactone Other (See Comments)   burning   Sulfa Antibiotics Swelling      Medication List       Accurate as of 07/12/17 12:13 PM. Always use your most recent med list.          aspirin EC 81 MG tablet Take 1 tablet (81 mg total) by mouth daily.   carvedilol 12.5 MG tablet Commonly known as:  COREG TAKE ONE TABLET BY MOUTH TWICE DAILY WITH  A  MEAL   cyclobenzaprine 10 MG tablet Commonly known as:  FLEXERIL Take 10 mg by mouth as needed for muscle spasms.   doxycycline 100 MG tablet Commonly known as:  VIBRA-TABS Take 1 tablet (100 mg total) by mouth 2 (two) times daily. 1 po bid   fluticasone 50 MCG/ACT nasal spray Commonly known as:  FLONASE Place 2 sprays into both nostrils daily.   furosemide 20 MG tablet Commonly known as:  LASIX Take 1 tablet (20 mg total) by mouth daily.   HYDROcodone-homatropine 5-1.5 MG/5ML syrup Commonly known as:  HYCODAN Take 5-10 mLs by mouth every 6 (six) hours as needed.   levothyroxine 88 MCG tablet Commonly known as:  SYNTHROID,  LEVOTHROID Take 1 tablet (88 mcg total) by mouth daily.   omeprazole 20 MG capsule Commonly known as:  PRILOSEC Take 20 mg by mouth daily as needed (HEARTBURN).   predniSONE 10 MG (21) Tbpk tablet Commonly known as:  STERAPRED UNI-PAK 21 TAB As directed x 6 days   PROBIOTIC PO Take 2 tablets by mouth daily.   sacubitril-valsartan 49-51 MG Commonly known as:  ENTRESTO Take 1 tablet by mouth 2 (two) times daily.   Vitamin D3 2000 units Tabs Take 2,000 Units by mouth daily.            Discharge Care Instructions        Start     Ordered   07/12/17 0000  predniSONE (STERAPRED UNI-PAK 21 TAB) 10 MG (21) TBPK tablet    Question:  Supervising Provider  Answer:  Timmothy Euler   07/12/17 0846   07/12/17 0000  doxycycline (VIBRA-TABS) 100 MG tablet  2 times daily    Question:  Supervising Provider  Answer:  Timmothy Euler   07/12/17 0846   07/12/17 0000  HYDROcodone-homatropine (HYCODAN) 5-1.5 MG/5ML syrup  Every 6 hours PRN    Question:  Supervising Provider  Answer:  Timmothy Euler   07/12/17 0846         Objective:    BP 101/64   Pulse 68   Temp 97.6 F (36.4 C) (Oral)   Ht 5\' 8"  (1.727 m)   Wt 211 lb (95.7 kg)   BMI 32.08 kg/m   Allergies  Allergen Reactions  . Avelox [Moxifloxacin Hcl In Nacl] Shortness Of Breath    Shortness of breath  . Clindamycin/Lincomycin Shortness Of Breath and Diarrhea    Breathing/diarrhea    . Kiwi Extract Anaphylaxis    Makes pt. Feel like her throat is closing   . Bactrim [Sulfamethoxazole-Trimethoprim] Swelling  . Levofloxacin Other (See Comments)    JOINT PAIN JOINT PAIN  . Moxifloxacin Other (See Comments)    Breathing problems Breathing problems  . Spironolactone Other (See Comments)    burning  . Sulfa Antibiotics Swelling    Physical Exam  Constitutional: She is oriented to person, place, and time. She appears well-developed and well-nourished.  HENT:  Head: Normocephalic and atraumatic.  Right  Ear: Tympanic membrane and external ear normal. No middle ear effusion.  Left Ear: Tympanic membrane and external ear normal.  No middle ear effusion.  Nose: Mucosal edema and rhinorrhea present. Right sinus exhibits no maxillary sinus tenderness. Left sinus exhibits no maxillary sinus tenderness.  Mouth/Throat: Uvula is midline. Posterior oropharyngeal erythema present.  Eyes: Pupils are equal, round, and reactive to light. Conjunctivae and EOM are normal. Right eye exhibits no discharge. Left eye exhibits no discharge.  Neck: Normal range of motion.  Cardiovascular: Normal rate, regular rhythm and normal heart sounds.   Pulmonary/Chest: Effort normal. No respiratory distress. She has wheezes.  Abdominal: Soft.  Lymphadenopathy:    She has no cervical adenopathy.  Neurological: She is alert and oriented to person, place, and time.  Skin: Skin is warm and dry.  Psychiatric: She has a normal mood and affect.        Assessment & Plan:   1. Bronchitis - predniSONE (STERAPRED UNI-PAK 21 TAB) 10 MG (21) TBPK tablet; As directed x 6 days  Dispense: 21 tablet; Refill: 0 - doxycycline (VIBRA-TABS) 100 MG tablet; Take 1 tablet (100 mg total) by mouth 2 (two) times daily. 1 po bid  Dispense: 20 tablet; Refill: 0 - HYDROcodone-homatropine (HYCODAN) 5-1.5 MG/5ML syrup; Take 5-10 mLs by mouth every 6 (six) hours as needed.  Dispense: 240 mL; Refill: 0  2. Acute non-recurrent maxillary sinusitis    Current Outpatient Prescriptions:  .  aspirin EC 81 MG tablet, Take 1 tablet (81 mg total) by mouth daily., Disp: 90 tablet, Rfl: 3 .  carvedilol (COREG) 12.5 MG tablet, TAKE ONE TABLET BY MOUTH TWICE DAILY WITH  A  MEAL, Disp: 180 tablet, Rfl: 3 .  Cholecalciferol (VITAMIN D3) 2000 units TABS, Take 2,000 Units by mouth daily., Disp: , Rfl:  .  cyclobenzaprine (FLEXERIL) 10 MG tablet, Take 10 mg by mouth as needed for muscle spasms. , Disp: , Rfl:  .  fluticasone (FLONASE) 50 MCG/ACT nasal spray, Place  2 sprays into both nostrils daily., Disp: 16 g, Rfl: 6 .  furosemide (LASIX) 20 MG tablet, Take 1 tablet (20 mg total) by mouth  daily., Disp: 90 tablet, Rfl: 1 .  levothyroxine (SYNTHROID, LEVOTHROID) 88 MCG tablet, Take 1 tablet (88 mcg total) by mouth daily., Disp: 90 tablet, Rfl: 1 .  omeprazole (PRILOSEC) 20 MG capsule, Take 20 mg by mouth daily as needed (HEARTBURN). , Disp: , Rfl:  .  Probiotic Product (PROBIOTIC PO), Take 2 tablets by mouth daily. , Disp: , Rfl:  .  sacubitril-valsartan (ENTRESTO) 49-51 MG, Take 1 tablet by mouth 2 (two) times daily., Disp: 60 tablet, Rfl: 3 .  doxycycline (VIBRA-TABS) 100 MG tablet, Take 1 tablet (100 mg total) by mouth 2 (two) times daily. 1 po bid, Disp: 20 tablet, Rfl: 0 .  HYDROcodone-homatropine (HYCODAN) 5-1.5 MG/5ML syrup, Take 5-10 mLs by mouth every 6 (six) hours as needed., Disp: 240 mL, Rfl: 0 .  predniSONE (STERAPRED UNI-PAK 21 TAB) 10 MG (21) TBPK tablet, As directed x 6 days, Disp: 21 tablet, Rfl: 0 Continue all other maintenance medications as listed above.  Follow up plan: Return if symptoms worsen or fail to improve.  Educational handout given for Painter PA-C [place] [address]  [place], [place] [address] [phone]   07/12/2017, 12:13 PM

## 2017-07-17 ENCOUNTER — Telehealth: Payer: Self-pay | Admitting: Physician Assistant

## 2017-07-17 MED ORDER — ALBUTEROL SULFATE HFA 108 (90 BASE) MCG/ACT IN AERS
2.0000 | INHALATION_SPRAY | Freq: Four times a day (QID) | RESPIRATORY_TRACT | 2 refills | Status: DC | PRN
Start: 2017-07-17 — End: 2018-12-21

## 2017-07-17 NOTE — Telephone Encounter (Signed)
Sent albuterol to pharmacy. Use 2 puffs 4 times a day or cough or wheeze.

## 2017-07-17 NOTE — Telephone Encounter (Signed)
Patient is still complaining with the fullness in her chest and drainage. Patient is also still experiencing a dry cough. Please advise

## 2017-07-17 NOTE — Telephone Encounter (Signed)
Detailed message left for patient with recommendations. 

## 2017-07-19 ENCOUNTER — Telehealth: Payer: Self-pay | Admitting: Cardiology

## 2017-07-19 DIAGNOSIS — I5022 Chronic systolic (congestive) heart failure: Secondary | ICD-10-CM

## 2017-07-19 NOTE — Telephone Encounter (Signed)
Pt is NOT calling in with active chest pain, she is calling in with reoccurring symptoms of sob and chest tightness, since she saw Scott on 9/11.  Pt states that at her visit with Nicki Reaper, she reported that she was feeling better, with no sob and chest tightness.  Pt said being her symptoms improved at that visit, both her and Nicki Reaper decided to hold off on referring the pt to Pulmonology.  Pt states that within the week after seeing Scott, her complaints of sob and chest tightness reoccurred.  Pt states she tried self-managing this issue, as well as following up with her PCP.  Pt states her PCP saw her 2 times for this issue, since her visit with Nicki Reaper on 9/11.  Pt reports that she has been on antibiotics, steroids, and inhalers for this issue, and reports no improvement. Pt states she would like to go back to Scott's recommendation to be referred to Pulmonology, if he felt this was appropriate.  Pt would like this information passed onto Scott to review and advise on.  Informed the pt that I will route this message to [facility] [place] for further review and recommendation and follow-up with the pt thereafter. Pt verbalized understanding and agrees with this plan.

## 2017-07-19 NOTE — Telephone Encounter (Signed)
Spoke with the pt and informed her that per Richardson Dopp PA-C, being she has systolic CHF, and recommends that we order CPX.  Informed the pt that a CPX is done in the Hospital.  Informed the pt that I will send a message to our [facility] Schedulers to call the pt back and arrange for testing.  Pt verbalized understanding and agrees with this plan.

## 2017-07-19 NOTE — Telephone Encounter (Signed)
Since she has systolic congestive heart failure, I think starting with a Cardiopulmonary Stress Test would be helpful. Please arrange.  Richardson Dopp, PA-C    07/19/2017 5:09 PM

## 2017-07-19 NOTE — Telephone Encounter (Signed)
New message     Pt c/o of Chest Pain: STAT if CP now or developed within 24 hours  1. Are you having CP right now? tightness  2. Are you experiencing any other symptoms (ex. SOB, nausea, vomiting, sweating)?  Slight SOB  3. How long have you been experiencing CP?  Since 9/12  4. Is your CP continuous or coming and going? continual  5. Have you taken Nitroglycerin? No   ?

## 2017-07-20 NOTE — Telephone Encounter (Signed)
Pts CPX is scheduled for 10/8 at 0900.  Pt made aware of appt date and time.

## 2017-07-26 DIAGNOSIS — R110 Nausea: Secondary | ICD-10-CM | POA: Diagnosis not present

## 2017-07-26 DIAGNOSIS — Z853 Personal history of malignant neoplasm of breast: Secondary | ICD-10-CM | POA: Diagnosis not present

## 2017-07-26 DIAGNOSIS — R079 Chest pain, unspecified: Secondary | ICD-10-CM | POA: Diagnosis not present

## 2017-07-26 DIAGNOSIS — F459 Somatoform disorder, unspecified: Secondary | ICD-10-CM | POA: Diagnosis not present

## 2017-07-31 ENCOUNTER — Other Ambulatory Visit (HOSPITAL_COMMUNITY): Payer: Self-pay | Admitting: *Deleted

## 2017-07-31 ENCOUNTER — Ambulatory Visit (HOSPITAL_COMMUNITY): Payer: BLUE CROSS/BLUE SHIELD | Attending: Cardiology

## 2017-07-31 DIAGNOSIS — I5022 Chronic systolic (congestive) heart failure: Secondary | ICD-10-CM

## 2017-08-02 ENCOUNTER — Encounter: Payer: Self-pay | Admitting: Physician Assistant

## 2017-08-02 ENCOUNTER — Telehealth: Payer: Self-pay | Admitting: *Deleted

## 2017-08-02 NOTE — Telephone Encounter (Signed)
-----   Message from Liliane Shi, [place] sent at 08/02/2017 11:25 AM EDT ----- Please call the patient. The cardiopulmonary stress test suggests exercise intolerance related to weight and deconditioning is contributing to shortness of breath.  There is no evidence of cardiopulmonary limitation.  Recommend gradually increasing activity to help with shortness of breath.  She can follow up with primary care if symptoms continue.  Based upon this test, I do not think Pulmonary evaluation is needed at this time.  Please fax a copy of this study result to her PCP:  Terald Sleeper, PA-C  Thanks! Richardson Dopp, PA-C    08/02/2017 11:15 AM

## 2017-08-02 NOTE — Telephone Encounter (Signed)
Left message to go over CPX results and recommendations.

## 2017-08-02 NOTE — Telephone Encounter (Signed)
Pt called back and told operator to please lmom on her phone that she is on her way to work. As per pt request I have left results and recommendations on her phone. If she has any questions she can cal back [phone]. I will forward results to PCP as well.

## 2017-08-02 NOTE — Telephone Encounter (Signed)
New Message     Leave results on voicemail pt is headed to work , will call back around if she has question

## 2017-08-04 ENCOUNTER — Telehealth: Payer: Self-pay | Admitting: Cardiology

## 2017-08-04 NOTE — Telephone Encounter (Signed)
Have you heard of this side effect with Entresto?

## 2017-08-04 NOTE — Telephone Encounter (Signed)
Pt calling to report to Dr [name] that over the last week she has had a stomach ache and reports having "a knot in my stomach." Pt reports that she thinks its Entresto causing her stomach ache, for she states she held it yesterday and her symptoms improved.  Pt states she has no diarrhea or N/V, just a stomach ache.  Pt has no other complaints from a cardiac perspective.  Pt would like for Dr [name] to advise on either stopping this med, taking the dose down, or switching her to a different regimen.  Informed the pt that Dr [name] is out of the office today, but I will route this message to her to review and advise on, and follow-up with the pt shortly thereafter.  Pt verbalized understanding and agrees with this plan.

## 2017-08-04 NOTE — Telephone Encounter (Signed)
°  New Prob  Pt reports episodes of abdominal pain and feels this may be related to her Entresto medication. Requesting to speak to a RN regarding possibly weaning off if this is the cause. Please call.

## 2017-08-07 NOTE — Telephone Encounter (Signed)
I have not seen this side effect with Entresto.

## 2017-08-08 NOTE — Telephone Encounter (Signed)
I have not either - would advise pt to take [facility] with food to see if this helps.

## 2017-08-10 DIAGNOSIS — Z853 Personal history of malignant neoplasm of breast: Secondary | ICD-10-CM | POA: Diagnosis not present

## 2017-08-10 DIAGNOSIS — R109 Unspecified abdominal pain: Secondary | ICD-10-CM | POA: Diagnosis not present

## 2017-08-16 ENCOUNTER — Ambulatory Visit (INDEPENDENT_AMBULATORY_CARE_PROVIDER_SITE_OTHER): Payer: BLUE CROSS/BLUE SHIELD | Admitting: *Deleted

## 2017-08-16 DIAGNOSIS — I428 Other cardiomyopathies: Secondary | ICD-10-CM

## 2017-08-16 NOTE — Progress Notes (Signed)
Remote ICD transmission.   

## 2017-08-18 ENCOUNTER — Encounter: Payer: Self-pay | Admitting: Cardiology

## 2017-08-18 LAB — CUP PACEART REMOTE DEVICE CHECK
Battery Remaining Longevity: 120 mo
Battery Remaining Percentage: 100 %
HIGH POWER IMPEDANCE MEASURED VALUE: 49 Ohm
Implantable Lead Implant Date: 20090320
Implantable Lead Location: 753860
Implantable Lead Model: 5076
Implantable Pulse Generator Implant Date: 20160307
Lead Channel Impedance Value: 517 Ohm
Lead Channel Pacing Threshold Pulse Width: 0.4 ms
Lead Channel Pacing Threshold Pulse Width: 0.4 ms
Lead Channel Setting Pacing Amplitude: 2.5 V
Lead Channel Setting Pacing Pulse Width: 0.4 ms
Lead Channel Setting Sensing Sensitivity: 0.6 mV
MDC IDC LEAD IMPLANT DT: 20090320
MDC IDC LEAD LOCATION: 753859
MDC IDC LEAD SERIAL: 182504
MDC IDC MSMT LEADCHNL RA PACING THRESHOLD AMPLITUDE: 0.4 V
MDC IDC MSMT LEADCHNL RV IMPEDANCE VALUE: 492 Ohm
MDC IDC MSMT LEADCHNL RV PACING THRESHOLD AMPLITUDE: 0.9 V
MDC IDC PG SERIAL: 111826
MDC IDC SESS DTM: 20181024101900
MDC IDC SET LEADCHNL RA PACING AMPLITUDE: 2 V
MDC IDC STAT BRADY RA PERCENT PACED: 1 %
MDC IDC STAT BRADY RV PERCENT PACED: 0 %

## 2017-08-18 NOTE — Progress Notes (Signed)
Letter  

## 2017-09-11 DIAGNOSIS — Z853 Personal history of malignant neoplasm of breast: Secondary | ICD-10-CM | POA: Diagnosis not present

## 2017-09-11 DIAGNOSIS — R1010 Upper abdominal pain, unspecified: Secondary | ICD-10-CM | POA: Diagnosis not present

## 2017-09-11 DIAGNOSIS — Z09 Encounter for follow-up examination after completed treatment for conditions other than malignant neoplasm: Secondary | ICD-10-CM | POA: Diagnosis not present

## 2017-09-21 ENCOUNTER — Other Ambulatory Visit: Payer: Self-pay | Admitting: *Deleted

## 2017-09-21 MED ORDER — LEVOTHYROXINE SODIUM 88 MCG PO TABS
88.0000 ug | ORAL_TABLET | Freq: Every day | ORAL | 1 refills | Status: DC
Start: 2017-09-21 — End: 2018-06-12

## 2017-10-17 IMAGING — DX DG CHEST 1V PORT
1 series · 1 of 1 positions shown · non-contrast
Comparison: December 22, 2014 chest radiograph and chest CT

CLINICAL DATA: Port-A-Cath placement

EXAM:
CHEST:  1 V ; intraoperative fluoroscopy

[chest ap]
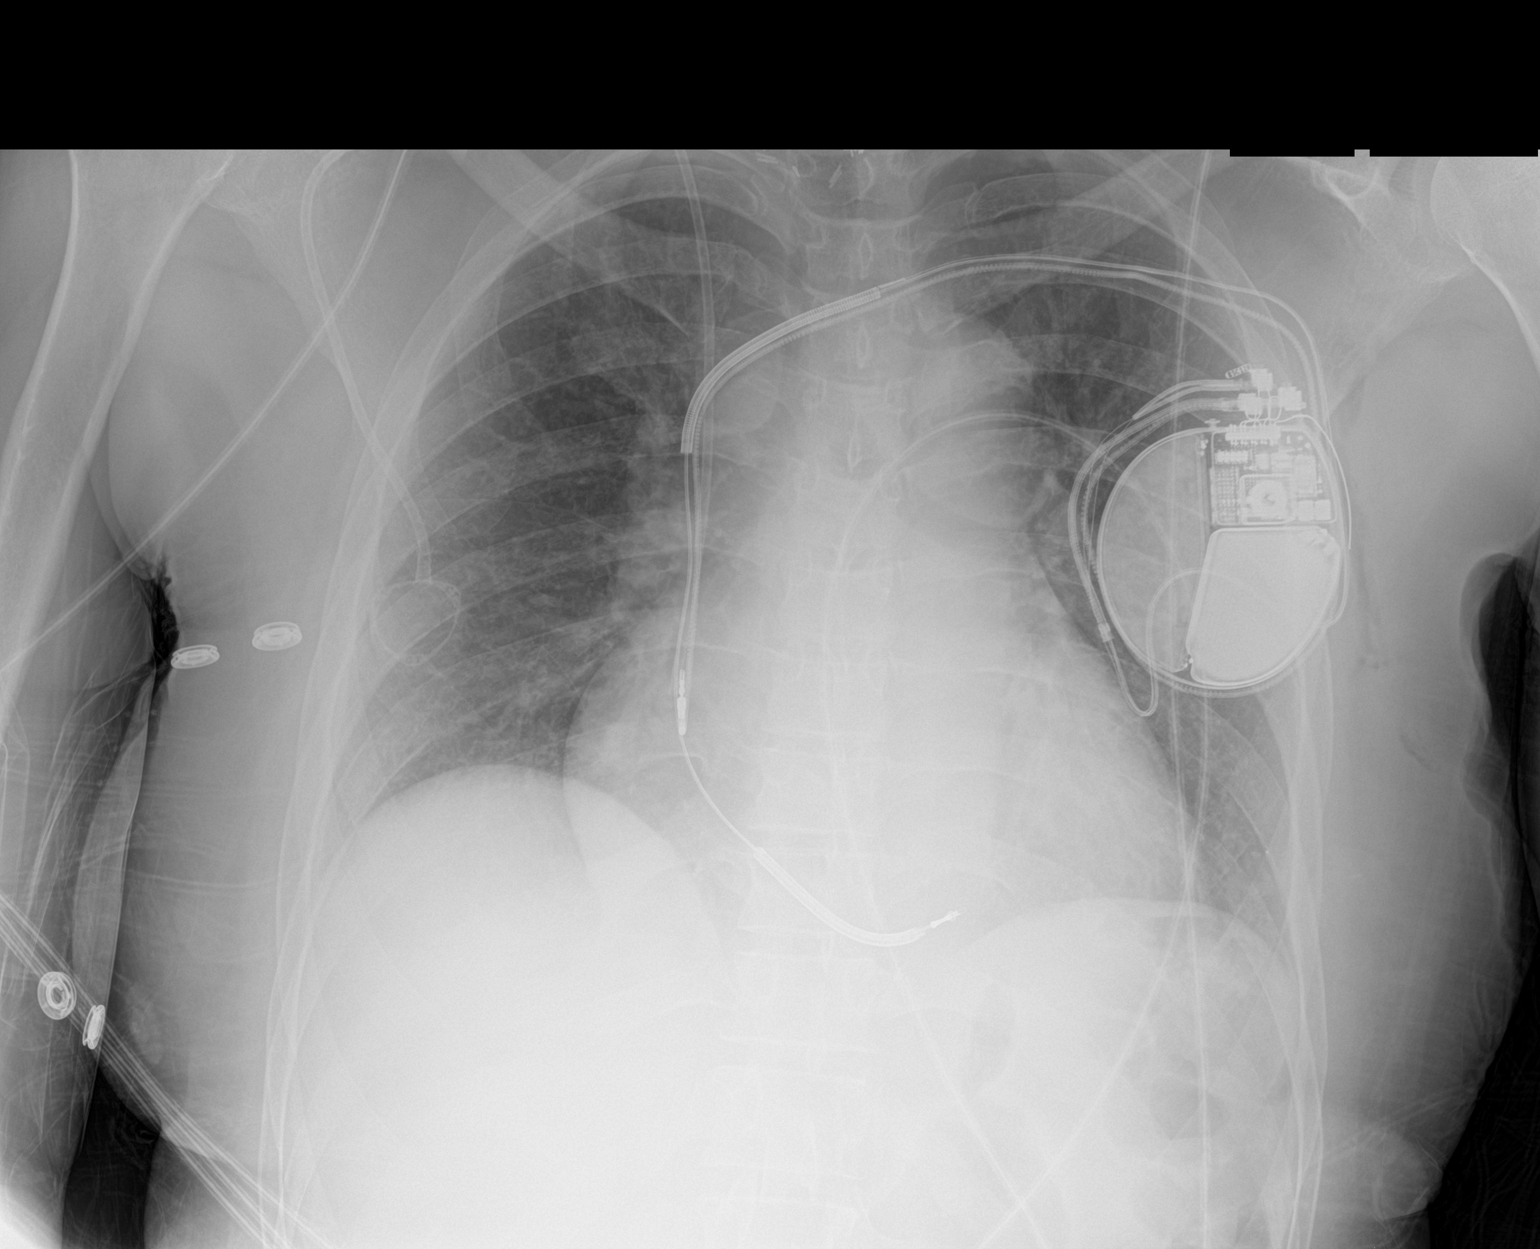

[1 of 1 positions shown; findings below may reference images not displayed]

FINDINGS: Port-A-Cath tip is at the cavoatrial junction. No pneumothorax.
There is no appreciable edema or consolidation. Heart is mildly
enlarged with pulmonary vascularity within normal limits. Pacemaker
leads are attached to the right atrium right ventricle. There are
surgical clips at the cervical -thoracic junction in the midline. No
adenopathy evident.
IMPRESSION: Port-A-Cath tip at cavoatrial junction. No edema or consolidation.
Heart mildly enlarged. No pneumothorax.

## 2017-11-07 ENCOUNTER — Encounter: Payer: Self-pay | Admitting: Nurse Practitioner

## 2017-11-07 ENCOUNTER — Ambulatory Visit: Payer: BLUE CROSS/BLUE SHIELD | Admitting: Nurse Practitioner

## 2017-11-07 VITALS — BP 113/73 | HR 72 | Temp 97.2°F | Ht 68.0 in | Wt 224.0 lb

## 2017-11-07 DIAGNOSIS — J0100 Acute maxillary sinusitis, unspecified: Secondary | ICD-10-CM | POA: Diagnosis not present

## 2017-11-07 MED ORDER — AZITHROMYCIN 250 MG PO TABS: ORAL_TABLET | ORAL | 0 refills | Status: DC

## 2017-11-07 NOTE — Patient Instructions (Signed)
1. Take meds as prescribed 2. Use a cool mist humidifier especially during the winter months and when heat has been humid. 3. Use saline nose sprays frequently 4. Saline irrigations of the nose can be very helpful if done frequently.  * 4X daily for 1 week*  * Use of a nettie pot can be helpful with this. Follow directions with this* 5. Drink plenty of fluids 6. Keep thermostat turn down low 7.For any cough or congestion  Use plain Mucinex- regular strength or max strength is fine or try      some claritin.   8. For fever or aces or pains- take tylenol or ibuprofen appropriate for age and weight.  * for fevers greater than 101 orally you may alternate ibuprofen and tylenol every  3 hours.

## 2017-11-07 NOTE — Progress Notes (Signed)
   Subjective:    Patient ID: [name] [name], female    DOB: December 01, 1960, 57 y.o.   MRN: [id number]  HPI  Patient come sin today c/o congestion x4 weeks and now has developed a cough. Facial pressure.   Review of Systems  Constitutional: Negative for chills and fever.  HENT: Positive for congestion, rhinorrhea and sinus pressure. Negative for sore throat and trouble swallowing.   Respiratory: Negative.  Negative for cough and shortness of breath.   Cardiovascular: Negative.   Genitourinary: Negative.   Neurological: Negative.   Psychiatric/Behavioral: Negative.   All other systems reviewed and are negative.      Objective:   Physical Exam  Constitutional: She is oriented to person, place, and time. She appears well-developed and well-nourished. No distress.  HENT:  Right Ear: Hearing, tympanic membrane, external ear and ear canal normal.  Left Ear: Hearing, tympanic membrane, external ear and ear canal normal.  Nose: Mucosal edema and rhinorrhea present. Right sinus exhibits maxillary sinus tenderness. Left sinus exhibits maxillary sinus tenderness.  Mouth/Throat: Oropharynx is clear and moist and mucous membranes are normal.  Neck: Normal range of motion. Neck supple.  Cardiovascular: Normal rate and regular rhythm.  Pulmonary/Chest: Effort normal and breath sounds normal.  Abdominal: Soft. Bowel sounds are normal.  Lymphadenopathy:    She has no cervical adenopathy.  Neurological: She is alert and oriented to person, place, and time.  Skin: Skin is warm.  Psychiatric: She has a normal mood and affect. Her behavior is normal. Judgment and thought content normal.   BP 113/73   Pulse 72   Temp (!) 97.2 F (36.2 C) (Oral)   Ht 5\' 8"  (1.727 m)   Wt 224 lb (101.6 kg)   BMI 34.06 kg/m         Assessment & Plan:  1. Acute maxillary sinusitis, recurrence not specified 1. Take meds as prescribed 2. Use a cool mist humidifier especially during the winter months and when heat  has been humid. 3. Use saline nose sprays frequently 4. Saline irrigations of the nose can be very helpful if done frequently.  * 4X daily for 1 week*  * Use of a nettie pot can be helpful with this. Follow directions with this* 5. Drink plenty of fluids 6. Keep thermostat turn down low 7.For any cough or congestion  Use plain Mucinex- regular strength or max strength is fine- or can try claritin    8. For fever or aces or pains- take tylenol or ibuprofen appropriate for age and weight.  * for fevers greater than 101 orally you may alternate ibuprofen and tylenol every  3 hours.   continue flonase as rx - azithromycin (ZITHROMAX Z-PAK) 250 MG tablet; As directed  Dispense: 6 tablet; Refill: 0  Mary-Margaret Hassell Done, FNP

## 2017-11-13 ENCOUNTER — Telehealth: Payer: Self-pay | Admitting: Physician Assistant

## 2017-11-13 ENCOUNTER — Other Ambulatory Visit: Payer: Self-pay | Admitting: Physician Assistant

## 2017-11-13 MED ORDER — AMOXICILLIN-POT CLAVULANATE 875-125 MG PO TABS: 1.0000 | ORAL_TABLET | Freq: Two times a day (BID) | ORAL | 0 refills | Status: DC

## 2017-11-13 MED ORDER — PREDNISONE 10 MG (21) PO TBPK: ORAL_TABLET | ORAL | 0 refills | Status: DC

## 2017-11-13 NOTE — Telephone Encounter (Signed)
Augmentin and sterapred sent

## 2017-11-14 NOTE — Telephone Encounter (Signed)
Detailed message left for patient with medications that were sent into pharmacy.

## 2017-11-15 ENCOUNTER — Ambulatory Visit (INDEPENDENT_AMBULATORY_CARE_PROVIDER_SITE_OTHER): Payer: BLUE CROSS/BLUE SHIELD | Admitting: *Deleted

## 2017-11-15 DIAGNOSIS — I428 Other cardiomyopathies: Secondary | ICD-10-CM | POA: Diagnosis not present

## 2017-11-15 NOTE — Progress Notes (Signed)
Remote ICD transmission.   

## 2017-11-16 ENCOUNTER — Encounter: Payer: Self-pay | Admitting: Cardiology

## 2017-11-29 DIAGNOSIS — F458 Other somatoform disorders: Secondary | ICD-10-CM | POA: Diagnosis not present

## 2017-12-09 LAB — CUP PACEART REMOTE DEVICE CHECK
Battery Remaining Longevity: 120 mo
Battery Remaining Percentage: 100 %
Brady Statistic RA Percent Paced: 0 %
HIGH POWER IMPEDANCE MEASURED VALUE: 51 Ohm
Implantable Lead Implant Date: 20090320
Implantable Lead Location: 753859
Implantable Lead Model: 5076
Implantable Lead Serial Number: 182504
Lead Channel Impedance Value: 530 Ohm
Lead Channel Impedance Value: 561 Ohm
Lead Channel Pacing Threshold Pulse Width: 0.4 ms
Lead Channel Setting Pacing Amplitude: 2.5 V
Lead Channel Setting Sensing Sensitivity: 0.6 mV
MDC IDC LEAD IMPLANT DT: 20090320
MDC IDC LEAD LOCATION: 753860
MDC IDC MSMT LEADCHNL RA PACING THRESHOLD AMPLITUDE: 0.4 V
MDC IDC MSMT LEADCHNL RV PACING THRESHOLD AMPLITUDE: 0.9 V
MDC IDC MSMT LEADCHNL RV PACING THRESHOLD PULSEWIDTH: 0.4 ms
MDC IDC PG IMPLANT DT: 20160307
MDC IDC SESS DTM: 20190123050100
MDC IDC SET LEADCHNL RA PACING AMPLITUDE: 2 V
MDC IDC SET LEADCHNL RV PACING PULSEWIDTH: 0.4 ms
MDC IDC STAT BRADY RV PERCENT PACED: 0 %
Pulse Gen Serial Number: 111826

## 2017-12-13 DIAGNOSIS — K224 Dyskinesia of esophagus: Secondary | ICD-10-CM | POA: Diagnosis not present

## 2017-12-13 DIAGNOSIS — F458 Other somatoform disorders: Secondary | ICD-10-CM | POA: Diagnosis not present

## 2017-12-13 DIAGNOSIS — Z95810 Presence of automatic (implantable) cardiac defibrillator: Secondary | ICD-10-CM | POA: Diagnosis not present

## 2017-12-13 DIAGNOSIS — K219 Gastro-esophageal reflux disease without esophagitis: Secondary | ICD-10-CM | POA: Diagnosis not present

## 2017-12-17 NOTE — Progress Notes (Addendum)
Cardiology Office Note Date:  12/18/2017  Patient ID:  [name] [name], [name] [name] 1961/09/11, MRN [id number] PCP:  Terald Sleeper, PA-C  Cardiologist:  Dr. [name] Electrophysiologist: Dr. [name]   Chief Complaint: annual EP/device visit  History of Present Illness: [name] [name] is a 57 y.o. female with history of NICM, chronic CHF (systolic) w/ICD, hypothyroidism, TIA, breast Ca treated with lumpectomy/radiation tx (declined chemo), multiple medication intolerances, (Spironolactone caused "body burning," could not afford BiDil, questionable abdominal pain with Entresto?).  She comes in today to be seen for Dr. [name], last seen by him in Feb 2018, at that time mentions NSVT, no changes were mad to her tx.  In general she never really feels well.  She continues to work, reports her job as keeping her moving all day.    She thinks the [name] is making her feel tired all of the time.  No near syncope or syncope, not weak or dizzy, just no energy.  She reports her exertional capacity at her baseline, no better or worse over the last few years,  No symptoms of PND or orthopnea.  No palpitations.  She has waking waning c/o CP that she dates back years and has been attributed to GI etiology.  She reports having a barium swallow done recently with a "twisted" esophagus.  She reports sometimes with belching that she will bring up food that she had eaten a day or two prior, and feels like her food is always stuck in her chest.  Note she has a cath 08/05/16 with normal coronaries.   Device information: [employer]i dual chamber ICD, implanted 2009, gen change 2016, Dr. [name]   Past Medical History:  Diagnosis Date  . AICD (automatic cardioverter/defibrillator) present 09   [employer]- replaced 2016  . Arthritis   . Bradycardia   . Breast cancer ([place]) 02/24/2016   s/p lumpectomy and radiation but refused chemotherapy  . Chronic systolic CHF (congestive heart failure) ([place])   . Cough due to  angiotensin-converting enzyme inhibitor    But patient chooses to continue ACE  . Dizziness    Dizziness with standing after squatting, March, 2012  . Dyslipidemia    LDL elevation,Patient does not want statin  . Family history of breast cancer   . GERD (gastroesophageal reflux disease)   . H/O shortness of breath    CPX 10/18:  No evidence of cardiopulmonary limitation. Suspect exercise intolerance related to weight and deconditioning.   [name] Hypothyroidism   . ICD (implantable cardiac defibrillator) battery depletion    Dr. [name], June, 2009(artifactual atrial tachycardia response events addressed by reprogramming in parentheses  . Leg swelling    right leg  . Nonischemic cardiomyopathy ([place])    Etiology unknown, diagnosed 2008, patient has never had catheterization or nuclear study as of March, 2011  . TIA (transient ischemic attack)    No CT or MRI abnormality, aspirin therapy    Past Surgical History:  Procedure Laterality Date  . BREAST BIOPSY Left 02/24/2016   U/S Core  . BREAST BIOPSY Left 02/24/2016   U/S Core  . BREAST LUMPECTOMY Left 04/05/2016  . BREAST LUMPECTOMY WITH RADIOACTIVE SEED AND SENTINEL LYMPH NODE BIOPSY Left 04/05/2016   Procedure: BREAST LUMPECTOMY WITH RADIOACTIVE SEED AND SENTINEL LYMPH NODE BIOPSY;  Surgeon: [name], MD;  Location: [facility];  Service: General;  Laterality: Left;  . CARDIAC CATHETERIZATION N/A 08/05/2016   Procedure: Left Heart Cath and Coronary Angiography;  Surgeon: Peter M Martinique, MD;  Location:  [facility] INVASIVE CV LAB;  Service: Cardiovascular;  Laterality: N/A;  . CARDIAC DEFIBRILLATOR PLACEMENT  09   [employer] no remote  . COLONOSCOPY    . CYST EXCISION Left    back of leg-lt   . IMPLANTABLE CARDIOVERTER DEFIBRILLATOR GENERATOR CHANGE N/A 12/29/2014   Procedure: IMPLANTABLE CARDIOVERTER DEFIBRILLATOR GENERATOR CHANGE;  Surgeon: [name], MD;  Location: [facility] CATH LAB;  Service: Cardiovascular;  Laterality: N/A;  . LATERAL  EPICONDYLE RELEASE  10/11/2012   Procedure: TENNIS ELBOW RELEASE;  Surgeon: [name], MD;  Location: [facility];  Service: Orthopedics;  Laterality: Right;  RIGHT ELBOW: TENOTOMY ELBOW LATERAL EPICONDYLITIS TENNIS ELBOW: RADIAL TUNNEL RELEASE  . PORT-A-CATH REMOVAL  04/13/2016   Procedure: MINOR REMOVAL PORT-A-CATH;  Surgeon: [name], MD;  Location: [place];  Service: General;;  . PORTACATH PLACEMENT Right 04/05/2016   Procedure: INSERTION PORT-A-CATH WITH [place];  Surgeon: [name], MD;  Location: [facility];  Service: General;  Laterality: Right;  right IJ  . TOTAL THYROIDECTOMY  2001  . TUBAL LIGATION  94  . UPPER GI ENDOSCOPY      Current Outpatient Medications  Medication Sig Dispense Refill  . albuterol (PROVENTIL HFA;VENTOLIN HFA) 108 (90 Base) MCG/ACT inhaler Inhale 2 puffs into the lungs every 6 (six) hours as needed for wheezing or shortness of breath. 1 Inhaler 2  . amitriptyline (ELAVIL) 10 MG tablet Take 10 mg by mouth daily.     [name] aspirin EC 81 MG tablet Take 1 tablet (81 mg total) by mouth daily. 90 tablet 3  . carvedilol (COREG) 12.5 MG tablet TAKE ONE TABLET BY MOUTH TWICE DAILY WITH  A  MEAL 180 tablet 3  . dicyclomine (BENTYL) 10 MG capsule Take 10 mg by mouth as directed.    . fluticasone (FLONASE) 50 MCG/ACT nasal spray Place 2 sprays into both nostrils daily. 16 g 6  . furosemide (LASIX) 20 MG tablet Take 1 tablet (20 mg total) by mouth daily. 90 tablet 1  . levothyroxine (SYNTHROID, LEVOTHROID) 88 MCG tablet Take 1 tablet (88 mcg total) by mouth daily. 90 tablet 1  . omeprazole (PRILOSEC) 40 MG capsule Take 40 mg by mouth daily.    . Probiotic Product (PROBIOTIC PO) Take 2 tablets by mouth daily.     . sacubitril-valsartan (ENTRESTO) 49-51 MG Take 1 tablet by mouth 2 (two) times daily. 60 tablet 3   No current facility-administered medications for this visit.     Allergies:   Avelox [moxifloxacin hcl in nacl];  Clindamycin/lincomycin; Kiwi extract; Bactrim [sulfamethoxazole-trimethoprim]; Levofloxacin; Moxifloxacin; Spironolactone; and Sulfa antibiotics   Social History:  The patient  reports that  has never smoked. she has never used smokeless tobacco. She reports that she does not drink alcohol or use drugs.   Family History:  The patient's family history includes Breast cancer in her maternal aunt; Breast cancer (age of onset: 68) in her mother; Cancer in her maternal aunt, mother, and paternal grandmother; Heart attack in her mother; Hypertension in her brother and sister.  ROS:  Please see the history of present illness.  All other systems are reviewed and otherwise negative.   PHYSICAL EXAM:  VS:  BP 110/68   Pulse 80   Ht 5\' 8"  (1.727 m)   Wt 221 lb 6.4 oz (100.4 kg)   SpO2 95%   BMI 33.66 kg/m  BMI: Body mass index is 33.66 kg/m. Well nourished, well developed, in no acute distress  HEENT: normocephalic,  atraumatic  Neck: no JVD, carotid bruits or masses Cardiac:  RRR; no significant murmurs, no rubs, or gallops Lungs:  CTA b/l, no wheezing, rhonchi or rales  Abd: soft, nontender MS: no deformity or  atrophy Ext: no edema  Skin: warm and dry, no rash Neuro:  No gross deficits appreciated Psych: euthymic mood, full affect   ICD site is stable, no tethering or discomfort   EKG:  Not done today ICD interrogation done today and reviewed by myself: battery and lead testing are good, NSVT, <1% Vpacing  Cardiac Catheterization 08/05/16 Normal coronary arteries EF 25-35  Nuclear stress test 07/27/16 Intermediate risk stress nuclear study with mild inferior ischemia; EF 39 with global hypokinesis and moderate LVE.  Echocardiogram 07/13/16 EF 30, diffuse HK, grade 1 diastolic dysfunction, mild MR, mildly reduced RVSF, PASP 19  Echocardiogram 11/28/15 EF 30-35, diffuse HK, mild MVP involving anterior leaflet, mild MR  Recent Labs: 05/17/2017: Hemoglobin 11.5; Magnesium 2.2;  NT-Pro BNP 284; Platelets 206; TSH 1.590 05/26/2017: BUN 12; Creatinine, Ser 0.92; Potassium 4.2; Sodium 144  No results found for requested labs within last 8760 hours.   CrCl cannot be calculated (Patient's most recent lab result is older than the maximum 21 days allowed.).   Wt Readings from Last 3 Encounters:  12/18/17 221 lb 6.4 oz (100.4 kg)  11/07/17 224 lb (101.6 kg)  07/12/17 211 lb (95.7 kg)     Other studies reviewed: Additional studies/records reviewed today include: summarized above  ASSESSMENT AND PLAN:  1. ICD     Intact function, no changes made     NSVT only  2. NICM, chronic CHF (systolic)     Down 3 lbs from last visit     Exam does not suggest fluid OL      On BB/Entresto, diuretic tx    Disposition: F/u with every 3 month remote device checks, in-clinic EP/deviec visit in 1 year, sooner if needed.  She sees Dr. [name] in a couple weeks, she prefers to discuss her Entresto/meds then.  No changes for now. She had labs done a couple months ago for a wellness check, she will bring for Dr. [name] to review as well.  Current medicines are reviewed at length with the patient today.  The patient did not have any concerns regarding medicines.  Venetia Night, PA-C 12/18/2017 1:32 PM     [place]  [phone] (office)  [phone] (fax)

## 2017-12-18 ENCOUNTER — Encounter: Payer: Self-pay | Admitting: Physician Assistant

## 2017-12-18 ENCOUNTER — Ambulatory Visit: Payer: BLUE CROSS/BLUE SHIELD | Admitting: Physician Assistant

## 2017-12-18 VITALS — BP 110/68 | HR 80 | Ht 68.0 in | Wt 221.4 lb

## 2017-12-18 DIAGNOSIS — I5022 Chronic systolic (congestive) heart failure: Secondary | ICD-10-CM | POA: Diagnosis not present

## 2017-12-18 DIAGNOSIS — Z95810 Presence of automatic (implantable) cardiac defibrillator: Secondary | ICD-10-CM | POA: Diagnosis not present

## 2017-12-18 DIAGNOSIS — I428 Other cardiomyopathies: Secondary | ICD-10-CM | POA: Diagnosis not present

## 2017-12-18 NOTE — Patient Instructions (Addendum)
Medication Instructions:   Your physician recommends that you continue on your current medications as directed. Please refer to the Current Medication list given to you today.    If you need a refill on your cardiac medications before your next appointment, please call your pharmacy.  Labwork: NONE ORDERED  TODAY    Testing/Procedures: NONE ORDERED  TODAY    Follow-Up:   Your physician wants you to follow-up in: [place] will receive a reminder letter in the mail two months in advance. If you don't receive a letter, please call our office to schedule the follow-up appointment.     Remote monitoring is used to monitor your Pacemaker of ICD from home. This monitoring reduces the number of office visits required to check your device to one time per year. It allows [place] to keep an eye on the functioning of your device to ensure it is working properly. You are scheduled for a device check from home on .  02-14-18 You may send your transmission at any time that day. If you have a wireless device, the transmission will be sent automatically. After your physician reviews your transmission, you will receive a postcard with your next transmission date.     Any Other Special Instructions Will Be Listed Below (If Applicable).  MAKE SURE  YOU BRING LABS TO APPOINTMENT WITH DR [name]

## 2017-12-22 ENCOUNTER — Other Ambulatory Visit: Payer: Self-pay | Admitting: Physician Assistant

## 2018-01-01 ENCOUNTER — Telehealth: Payer: Self-pay | Admitting: *Deleted

## 2018-01-01 ENCOUNTER — Encounter: Payer: Self-pay | Admitting: Cardiology

## 2018-01-01 ENCOUNTER — Ambulatory Visit: Payer: BLUE CROSS/BLUE SHIELD | Admitting: Cardiology

## 2018-01-01 VITALS — BP 102/80 | HR 96 | Ht 68.0 in | Wt 222.0 lb

## 2018-01-01 DIAGNOSIS — I5022 Chronic systolic (congestive) heart failure: Secondary | ICD-10-CM

## 2018-01-01 DIAGNOSIS — I428 Other cardiomyopathies: Secondary | ICD-10-CM | POA: Diagnosis not present

## 2018-01-01 MED ORDER — SACUBITRIL-VALSARTAN 24-26 MG PO TABS: 1.0000 | ORAL_TABLET | Freq: Two times a day (BID) | ORAL | 4 refills | Status: DC

## 2018-01-01 NOTE — Progress Notes (Signed)
Cardiology Office Note Date:  01/01/2018  Patient ID:  [name], [name] November 05, 1960, MRN [id number] PCP:  Terald Sleeper, PA-C  Cardiologist:  Dr. [name] Electrophysiologist: Dr. [name]   Chief Complaint: annual EP/device visit  History of Present Illness: [name] [name] is a 57 y.o. female with history of NICM, chronic CHF (systolic) w/ICD, hypothyroidism, TIA, breast Ca treated with lumpectomy/radiation tx (declined chemo), multiple medication intolerances, (Spironolactone caused "body burning," could not afford BiDil, questionable abdominal pain with Entresto?). She was seen by EP on 12/21/17, ICD checked and functioning properly. H/o nsVTs on ICD.  Dr. [name], last seen by him in Feb 2018, at that time mentions NSVT, no changes were mad to her tx. She felt [name] [name] is making her feel tired all of the time.  No near syncope or syncope, not weak or dizzy, just no energy.  She reports her exertional capacity at her baseline, no better or worse over the last few years,  No symptoms of PND or orthopnea.  No palpitations.  She has waking waning c/o CP that she dates back years and has been attributed to GI etiology.  She reports having a barium swallow done recently with a "twisted" esophagus.  She reports sometimes with belching that she will bring up food that she had eaten a day or two prior, and feels like her food is always stuck in her chest.  Note she has a cath 08/05/16 with normal coronaries.  01/01/2018 - 1 year follow up with me, she denies CP or SOB, but feels fatigued all the time. She blames Entresto. No palpitations, dizziness or syncope. Works 12 hour shifts and is lifting heavy objects, able to finish work but no energy. No LE edema, orthopnea, PND.   Device information: Dual chamber ICD, implanted 2009, gen change 2016, Dr. [name]  Past Medical History:  Diagnosis Date  . AICD (automatic cardioverter/defibrillator) present 09   [employer]- replaced 2016  . Arthritis   .  Bradycardia   . Breast cancer ([place]) 02/24/2016   s/p lumpectomy and radiation but refused chemotherapy  . Chronic systolic CHF (congestive heart failure) ([place])   . Cough due to angiotensin-converting enzyme inhibitor    But patient chooses to continue ACE  . Dizziness    Dizziness with standing after squatting, March, 2012  . Dyslipidemia    LDL elevation,Patient does not want statin  . Family history of breast cancer   . GERD (gastroesophageal reflux disease)   . H/O shortness of breath    CPX 10/18:  No evidence of cardiopulmonary limitation. Suspect exercise intolerance related to weight and deconditioning.   [name] Hypothyroidism   . ICD (implantable cardiac defibrillator) battery depletion    Dr. [name], June, 2009(artifactual atrial tachycardia response events addressed by reprogramming in parentheses  . Leg swelling    right leg  . Nonischemic cardiomyopathy ([place])    Etiology unknown, diagnosed 2008, patient has never had catheterization or nuclear study as of March, 2011  . TIA (transient ischemic attack)    No CT or MRI abnormality, aspirin therapy    Past Surgical History:  Procedure Laterality Date  . BREAST BIOPSY Left 02/24/2016   U/S Core  . BREAST BIOPSY Left 02/24/2016   U/S Core  . BREAST LUMPECTOMY Left 04/05/2016  . BREAST LUMPECTOMY WITH RADIOACTIVE SEED AND SENTINEL LYMPH NODE BIOPSY Left 04/05/2016   Procedure: BREAST LUMPECTOMY WITH RADIOACTIVE SEED AND SENTINEL LYMPH NODE BIOPSY;  Surgeon: [name], MD;  Location: [facility];  Service: General;  Laterality: Left;  . CARDIAC CATHETERIZATION N/A 08/05/2016   Procedure: Left Heart Cath and Coronary Angiography;  Surgeon: Peter M Martinique, MD;  Location: [facility];  Service: Cardiovascular;  Laterality: N/A;  . CARDIAC DEFIBRILLATOR PLACEMENT  09   [employer] no remote  . COLONOSCOPY    . CYST EXCISION Left    back of leg-lt   . IMPLANTABLE CARDIOVERTER DEFIBRILLATOR GENERATOR CHANGE N/A 12/29/2014    Procedure: IMPLANTABLE CARDIOVERTER DEFIBRILLATOR GENERATOR CHANGE;  Surgeon: [name], MD;  Location: [facility] CATH LAB;  Service: Cardiovascular;  Laterality: N/A;  . LATERAL EPICONDYLE RELEASE  10/11/2012   Procedure: TENNIS ELBOW RELEASE;  Surgeon: [name], MD;  Location: [facility];  Service: Orthopedics;  Laterality: Right;  RIGHT ELBOW: TENOTOMY ELBOW LATERAL EPICONDYLITIS TENNIS ELBOW: RADIAL TUNNEL RELEASE  . PORT-A-CATH REMOVAL  04/13/2016   Procedure: MINOR REMOVAL PORT-A-CATH;  Surgeon: [name], MD;  Location: [facility];  Service: General;;  . PORTACATH PLACEMENT Right 04/05/2016   Procedure: INSERTION PORT-A-CATH WITH [place];  Surgeon: [name], MD;  Location: [facility];  Service: General;  Laterality: Right;  right IJ  . TOTAL THYROIDECTOMY  2001  . TUBAL LIGATION  94  . UPPER GI ENDOSCOPY      Current Outpatient Medications  Medication Sig Dispense Refill  . albuterol (PROVENTIL HFA;VENTOLIN HFA) 108 (90 Base) MCG/ACT inhaler Inhale 2 puffs into the lungs every 6 (six) hours as needed for wheezing or shortness of breath. 1 Inhaler 2  . amitriptyline (ELAVIL) 10 MG tablet Take 10 mg by mouth daily.     [name] aspirin EC 81 MG tablet Take 1 tablet (81 mg total) by mouth daily. 90 tablet 3  . carvedilol (COREG) 12.5 MG tablet TAKE ONE TABLET BY MOUTH TWICE DAILY WITH  A  MEAL 180 tablet 3  . dicyclomine (BENTYL) 10 MG capsule Take 10 mg by mouth as directed.    . fluticasone (FLONASE) 50 MCG/ACT nasal spray Place 2 sprays into both nostrils daily. 16 g 6  . furosemide (LASIX) 20 MG tablet TAKE 1 TABLET BY MOUTH ONCE DAILY 90 tablet 3  . levothyroxine (SYNTHROID, LEVOTHROID) 88 MCG tablet Take 1 tablet (88 mcg total) by mouth daily. 90 tablet 1  . omeprazole (PRILOSEC) 40 MG capsule Take 40 mg by mouth daily.    . Probiotic Product (PROBIOTIC PO) Take 2 tablets by mouth daily.     . sacubitril-valsartan (ENTRESTO) 24-26 MG Take 1 tablet by mouth 2  (two) times daily. 60 tablet 4   No current facility-administered medications for this visit.     Allergies:   Avelox [moxifloxacin hcl in nacl]; Clindamycin/lincomycin; Kiwi extract; Bactrim [sulfamethoxazole-trimethoprim]; Levofloxacin; Moxifloxacin; Spironolactone; and Sulfa antibiotics   Social History:  The patient  reports that  has never smoked. she has never used smokeless tobacco. She reports that she does not drink alcohol or use drugs.   Family History:  The patient's family history includes Breast cancer in her maternal aunt; Breast cancer (age of onset: 80) in her mother; Cancer in her maternal aunt, mother, and paternal grandmother; Heart attack in her mother; Hypertension in her brother and sister.  ROS:  Please see the history of present illness.  All other systems are reviewed and otherwise negative.   PHYSICAL EXAM:  VS:  BP 102/80 (BP Location: Left Arm, Patient Position: Sitting, Cuff Size: Normal)   Pulse 96   Ht 5\' 8"  (1.727 m)  Wt 222 lb (100.7 kg)   SpO2 97%   BMI 33.75 kg/m  BMI: Body mass index is 33.75 kg/m. Well nourished, well developed, in no acute distress  HEENT: normocephalic, atraumatic  Neck: no JVD, carotid bruits or masses Cardiac:  RRR; no significant murmurs, no rubs, or gallops Lungs:  CTA b/l, no wheezing, rhonchi or rales  Abd: soft, nontender MS: no deformity or  atrophy Ext: no edema  Skin: warm and dry, no rash Neuro:  No gross deficits appreciated Psych: euthymic mood, full affect   ICD site is stable, no tethering or discomfort  EKG:  Done today, SR, non-specific ST T wave abnormalities. Unchanged from prior. Personally reviewed.   Cardiac Catheterization 08/05/16 Normal coronary arteries EF 25-35  Nuclear stress test 07/27/16 Intermediate risk stress nuclear study with mild inferior ischemia; EF 39 with global hypokinesis and moderate LVE.  Echocardiogram 07/13/16 EF 30, diffuse HK, grade 1 diastolic dysfunction, mild MR,  mildly reduced RVSF, PASP 19  Echocardiogram 11/28/15 EF 30-35, diffuse HK, mild MVP involving anterior leaflet, mild MR  Recent Labs: 05/17/2017: Hemoglobin 11.5; Magnesium 2.2; NT-Pro BNP 284; Platelets 206; TSH 1.590 05/26/2017: BUN 12; Creatinine, Ser 0.92; Potassium 4.2; Sodium 144  No results found for requested labs within last 8760 hours.   CrCl cannot be calculated (Patient's most recent lab result is older than the maximum 21 days allowed.).   Wt Readings from Last 3 Encounters:  01/01/18 222 lb (100.7 kg)  12/18/17 221 lb 6.4 oz (100.4 kg)  11/07/17 224 lb (101.6 kg)    Other studies reviewed: Additional studies/records reviewed today include: summarized above    ASSESSMENT AND PLAN:  1.  NICM, chronic CHF (systolic)     Euvolemic, stable weight, continue carvedilol /Entresto, low dose lasix 20 mg po daily. We will decresae the dose of Entresto as her BP low with profound fatigue, we will repeat echocardiogram, the last one 2 years. Ago. 2. S/p ICD placement - functioning properly 3. Hypertensio - crather low 4. HLP - followed by PCP.    Disposition: F/u in 3 months.   Signed, Ena Dawley, MD 01/01/2018 3:26 PM     [facility] [address] [place]  [place] [address] [phone] (office)  [phone] (fax)

## 2018-01-01 NOTE — Telephone Encounter (Signed)
01/01/18 ECHO--F/U--PATINT SAID SHE WIL CALL BACK TO SCHEDULE.  Received: Today  Message Contents  Juliette Alcide, LPN

## 2018-01-01 NOTE — Patient Instructions (Signed)
Medication Instructions:   DECREASE YOUR ENTRESTO TO 24/26 MG BY MOUTH TWICE DAILY     Testing/Procedures:  Your physician has requested that you have an echocardiogram. Echocardiography is a painless test that uses sound waves to create images of your heart. It provides your doctor with information about the size and shape of your heart and how well your heart's chambers and valves are working. This procedure takes approximately one hour. There are no restrictions for this procedure.     Follow-Up:  3 MONTHS WITH AN EXTENDER ON DR NELSON'S TEAM      If you need a refill on your cardiac medications before your next appointment, please call your pharmacy.

## 2018-01-02 ENCOUNTER — Telehealth: Payer: Self-pay | Admitting: *Deleted

## 2018-01-02 NOTE — Telephone Encounter (Signed)
-----   Message from Philbert Riser sent at 01/02/2018  8:19 AM EDT ----- Regarding: RE: ECHO PER DR Berta Minor, She said she will call us back, had to check her schedule. ----- Message ----- From: [name], LPN Sent: 2/55/0016   5:02 PM To: [facility] Subject: ECHO PER DR NELSON                             THIS PT WAS SEEN IN CLINIC TODAY BY DR [name] AND SHE ORDERED FOR HER TO HAVE AN ECHO DONE AND 3 MONTH FOLLOW-UP WITH APP.  CAN YOU PLEASE CALL HER AND SCHEDULE BOTH? I SENT HER TO CHECK OUT BUT I DON'T KNOW WHAT HAPPENED.   THANKS FOR ALL YOU DO,  IVY

## 2018-01-15 DIAGNOSIS — Z1331 Encounter for screening for depression: Secondary | ICD-10-CM | POA: Diagnosis not present

## 2018-01-15 DIAGNOSIS — Z008 Encounter for other general examination: Secondary | ICD-10-CM | POA: Diagnosis not present

## 2018-01-15 DIAGNOSIS — Z719 Counseling, unspecified: Secondary | ICD-10-CM | POA: Diagnosis not present

## 2018-01-15 DIAGNOSIS — Z1339 Encounter for screening examination for other mental health and behavioral disorders: Secondary | ICD-10-CM | POA: Diagnosis not present

## 2018-01-22 ENCOUNTER — Other Ambulatory Visit: Payer: Self-pay | Admitting: Cardiology

## 2018-01-22 DIAGNOSIS — I429 Cardiomyopathy, unspecified: Secondary | ICD-10-CM

## 2018-01-24 ENCOUNTER — Other Ambulatory Visit: Payer: Self-pay

## 2018-01-24 ENCOUNTER — Ambulatory Visit (HOSPITAL_COMMUNITY): Payer: BLUE CROSS/BLUE SHIELD | Attending: Cardiology

## 2018-01-24 DIAGNOSIS — E785 Hyperlipidemia, unspecified: Secondary | ICD-10-CM | POA: Insufficient documentation

## 2018-01-24 DIAGNOSIS — I340 Nonrheumatic mitral (valve) insufficiency: Secondary | ICD-10-CM | POA: Insufficient documentation

## 2018-01-24 DIAGNOSIS — Z853 Personal history of malignant neoplasm of breast: Secondary | ICD-10-CM | POA: Insufficient documentation

## 2018-01-24 DIAGNOSIS — I5022 Chronic systolic (congestive) heart failure: Secondary | ICD-10-CM

## 2018-01-25 ENCOUNTER — Other Ambulatory Visit: Payer: Self-pay | Admitting: Adult Health

## 2018-01-25 DIAGNOSIS — Z853 Personal history of malignant neoplasm of breast: Secondary | ICD-10-CM

## 2018-01-29 ENCOUNTER — Telehealth: Payer: Self-pay | Admitting: Cardiology

## 2018-01-29 NOTE — Telephone Encounter (Signed)
Echo results reviewed with patient who verbalized understanding. I asked about the 3 mo follow-up appointment with an APP on Dr. Francesca Oman team and she states the schedule was not available at the time of her visit. I scheduled her appointment with Lyda Jester, PA on 6/12. She thanked me for the call.

## 2018-01-29 NOTE — Telephone Encounter (Signed)
New message  ° ° °Patient calling back for echo results.   °

## 2018-01-30 DIAGNOSIS — Z01419 Encounter for gynecological examination (general) (routine) without abnormal findings: Secondary | ICD-10-CM | POA: Diagnosis not present

## 2018-01-30 DIAGNOSIS — Z1151 Encounter for screening for human papillomavirus (HPV): Secondary | ICD-10-CM | POA: Diagnosis not present

## 2018-01-30 DIAGNOSIS — Z6832 Body mass index (BMI) 32.0-32.9, adult: Secondary | ICD-10-CM | POA: Diagnosis not present

## 2018-02-13 DIAGNOSIS — M722 Plantar fascial fibromatosis: Secondary | ICD-10-CM | POA: Diagnosis not present

## 2018-02-13 DIAGNOSIS — M79671 Pain in right foot: Secondary | ICD-10-CM | POA: Diagnosis not present

## 2018-02-14 ENCOUNTER — Ambulatory Visit (INDEPENDENT_AMBULATORY_CARE_PROVIDER_SITE_OTHER): Payer: BLUE CROSS/BLUE SHIELD | Admitting: *Deleted

## 2018-02-14 DIAGNOSIS — I428 Other cardiomyopathies: Secondary | ICD-10-CM

## 2018-02-14 NOTE — Progress Notes (Signed)
Remote ICD transmission.   

## 2018-02-15 LAB — CUP PACEART REMOTE DEVICE CHECK
Battery Remaining Longevity: 114 mo
Battery Remaining Percentage: 100 %
Brady Statistic RA Percent Paced: 0 %
Brady Statistic RV Percent Paced: 0 %
Date Time Interrogation Session: 20190424100600
HighPow Impedance: 51 Ohm
Implantable Lead Implant Date: 20090320
Implantable Lead Implant Date: 20090320
Implantable Lead Location: 753859
Implantable Lead Location: 753860
Implantable Lead Model: 158
Implantable Lead Model: 5076
Implantable Lead Serial Number: 182504
Implantable Pulse Generator Implant Date: 20160307
Lead Channel Impedance Value: 532 Ohm
Lead Channel Impedance Value: 552 Ohm
Lead Channel Pacing Threshold Amplitude: 0.4 V
Lead Channel Pacing Threshold Amplitude: 0.9 V
Lead Channel Pacing Threshold Pulse Width: 0.4 ms
Lead Channel Pacing Threshold Pulse Width: 0.4 ms
Lead Channel Setting Pacing Amplitude: 2 V
Lead Channel Setting Pacing Amplitude: 2.5 V
Lead Channel Setting Pacing Pulse Width: 0.4 ms
Lead Channel Setting Sensing Sensitivity: 0.6 mV
Pulse Gen Serial Number: 111826

## 2018-02-16 ENCOUNTER — Encounter: Payer: Self-pay | Admitting: Cardiology

## 2018-03-07 ENCOUNTER — Ambulatory Visit
Admission: RE | Admit: 2018-03-07 | Discharge: 2018-03-07 | Disposition: A | Discharge status: Home / Self Care | Payer: BLUE CROSS/BLUE SHIELD | Source: Ambulatory Visit | Attending: Adult Health | Admitting: Adult Health

## 2018-03-07 DIAGNOSIS — R922 Inconclusive mammogram: Secondary | ICD-10-CM | POA: Diagnosis not present

## 2018-03-07 DIAGNOSIS — Z853 Personal history of malignant neoplasm of breast: Secondary | ICD-10-CM

## 2018-03-07 HISTORY — DX: Personal history of irradiation: Z92.3

## 2018-03-27 DIAGNOSIS — M722 Plantar fascial fibromatosis: Secondary | ICD-10-CM | POA: Diagnosis not present

## 2018-04-04 ENCOUNTER — Encounter: Payer: Self-pay | Admitting: Cardiology

## 2018-04-04 ENCOUNTER — Ambulatory Visit: Payer: BLUE CROSS/BLUE SHIELD | Admitting: Cardiology

## 2018-04-04 VITALS — BP 110/84 | HR 79 | Ht 68.0 in | Wt 223.0 lb

## 2018-04-04 DIAGNOSIS — I5022 Chronic systolic (congestive) heart failure: Secondary | ICD-10-CM

## 2018-04-04 LAB — BASIC METABOLIC PANEL
BUN / CREAT RATIO: 14 (ref 9–23)
BUN: 11 mg/dL (ref 6–24)
CO2: 26 mmol/L (ref 20–29)
Calcium: 9.1 mg/dL (ref 8.7–10.2)
Chloride: 106 mmol/L (ref 96–106)
Creatinine, Ser: 0.78 mg/dL (ref 0.57–1.00)
GFR calc Af Amer: 98 mL/min/{1.73_m2} (ref >59)
GFR, EST NON AFRICAN AMERICAN: 85 mL/min/{1.73_m2} (ref >59)
Glucose: 82 mg/dL (ref 65–99)
POTASSIUM: 4.1 mmol/L (ref 3.5–5.2)
SODIUM: 143 mmol/L (ref 134–144)

## 2018-04-04 LAB — PRO B NATRIURETIC PEPTIDE: NT-Pro BNP: 209 pg/mL (ref 0–287)

## 2018-04-04 NOTE — Progress Notes (Signed)
04/04/2018 [name] [name]   July 31, 1961  [id number]  Primary Physician Terald Sleeper, PA-C Primary Cardiologist: Dr. [name]  Electrophysiologist: Dr. [name]   Reason for Visit/CC: f/u for NICM/ Chronic Systolic HF  HPI:  [name] [name] is a 57 y.o. female who is being seen today for chronic systolic HF and NICM. She has an ICD for primary prevention, followed by Dr. [name]. Also with h/o hypothyroidism, TIA, breast cancer w/ lumpectomy (declined chemo) and multiple medication intolerances (Spironolactone caused body burning sensation, could not afford Bidil and had fatigue with high does of Entresto).   She is here today for 3 month f/u. Last seen by Dr. [name] 12/2017 and complained of fatigue, which pt contributed to higher dose of Entresto. Dr. [name] reduced the dose and instructed pt to get repeat echo. Echo done 01/2018 showed EF at 30-35%. No significant change from previous.   Today in f/u, pt notes improvement in symptoms since reducing Entresto dose. She notes less fatigue and improved exercise tolerance. Her BP has remained stable and controlled. BP today is 110/84. She denies CP. No dyspnea. She also denies orthopnea and PND but is complaining of increased abdominal distention. She notes full compliance with Lasix and restricts sodium. She is not compliant with daily weights. Feels as though her pants are fitting tighter. No LEE.   Current Meds  Medication Sig  . albuterol (PROVENTIL HFA;VENTOLIN HFA) 108 (90 Base) MCG/ACT inhaler Inhale 2 puffs into the lungs every 6 (six) hours as needed for wheezing or shortness of breath.  [name] amitriptyline (ELAVIL) 10 MG tablet Take 10 mg by mouth daily.   [name] aspirin EC 81 MG tablet Take 1 tablet (81 mg total) by mouth daily.  . carvedilol (COREG) 12.5 MG tablet TAKE 1 TABLET BY MOUTH  TWICE A DAY WITH A MEAL  . dicyclomine (BENTYL) 10 MG capsule Take 10 mg by mouth as directed.  . fluticasone (FLONASE) 50 MCG/ACT nasal spray Place 2 sprays into both  nostrils daily.  . furosemide (LASIX) 20 MG tablet TAKE 1 TABLET BY MOUTH ONCE DAILY  . levothyroxine (SYNTHROID, LEVOTHROID) 88 MCG tablet Take 1 tablet (88 mcg total) by mouth daily.  [name] omeprazole (PRILOSEC) 40 MG capsule Take 40 mg by mouth daily.  . Probiotic Product (PROBIOTIC PO) Take 2 tablets by mouth daily.   . sacubitril-valsartan (ENTRESTO) 24-26 MG Take 1 tablet by mouth 2 (two) times daily.   Allergies  Allergen Reactions  . Avelox [Moxifloxacin Hcl In Nacl] Shortness Of Breath    Shortness of breath  . Clindamycin/Lincomycin Shortness Of Breath and Diarrhea    Breathing/diarrhea    . Kiwi Extract Anaphylaxis    Makes pt. Feel like her throat is closing   . Bactrim [Sulfamethoxazole-Trimethoprim] Swelling  . Levofloxacin Other (See Comments)    JOINT PAIN JOINT PAIN  . Moxifloxacin Other (See Comments)    Breathing problems Breathing problems  . Spironolactone Other (See Comments)    burning  . Sulfa Antibiotics Swelling   Past Medical History:  Diagnosis Date  . AICD (automatic cardioverter/defibrillator) present 09   [employer]- replaced 2016  . Arthritis   . Bradycardia   . Breast cancer ([place]) 02/24/2016   s/p lumpectomy and radiation but refused chemotherapy  . Chronic systolic CHF (congestive heart failure) ([place])   . Cough due to angiotensin-converting enzyme inhibitor    But patient chooses to continue ACE  . Dizziness    Dizziness with standing after squatting, March, 2012  .  Dyslipidemia    LDL elevation,Patient does not want statin  . Family history of breast cancer   . GERD (gastroesophageal reflux disease)   . H/O shortness of breath    CPX 10/18:  No evidence of cardiopulmonary limitation. Suspect exercise intolerance related to weight and deconditioning.   [name] Hypothyroidism   . ICD (implantable cardiac defibrillator) battery depletion    Dr. [name], June, 2009(artifactual atrial tachycardia response events addressed by reprogramming in  parentheses  . Leg swelling    right leg  . Nonischemic cardiomyopathy ([place])    Etiology unknown, diagnosed 2008, patient has never had catheterization or nuclear study as of March, 2011  . Personal history of radiation therapy 2017  . TIA (transient ischemic attack)    No CT or MRI abnormality, aspirin therapy   Family History  Problem Relation Age of Onset  . Heart attack Mother   . Cancer Mother        breast  . Breast cancer Mother 16  . Hypertension Sister   . Hypertension Brother   . Cancer Maternal Aunt        breast  . Breast cancer Maternal Aunt   . Cancer Paternal Grandmother        possible cancer, unknown type  . Stroke Neg Hx   . Diabetes Neg Hx    Past Surgical History:  Procedure Laterality Date  . BREAST BIOPSY Left 02/24/2016   U/S Core  . BREAST BIOPSY Left 02/24/2016   U/S Core  . BREAST LUMPECTOMY Left 04/05/2016  . BREAST LUMPECTOMY WITH RADIOACTIVE SEED AND SENTINEL LYMPH NODE BIOPSY Left 04/05/2016   Procedure: BREAST LUMPECTOMY WITH RADIOACTIVE SEED AND SENTINEL LYMPH NODE BIOPSY;  Surgeon: [name], MD;  Location: [facility];  Service: General;  Laterality: Left;  . CARDIAC CATHETERIZATION N/A 08/05/2016   Procedure: Left Heart Cath and Coronary Angiography;  Surgeon: Peter M Martinique, MD;  Location: [facility];  Service: Cardiovascular;  Laterality: N/A;  . CARDIAC DEFIBRILLATOR PLACEMENT  09   [employer] no remote  . COLONOSCOPY    . CYST EXCISION Left    back of leg-lt   . IMPLANTABLE CARDIOVERTER DEFIBRILLATOR GENERATOR CHANGE N/A 12/29/2014   Procedure: IMPLANTABLE CARDIOVERTER DEFIBRILLATOR GENERATOR CHANGE;  Surgeon: [name], MD;  Location: [facility] CATH LAB;  Service: Cardiovascular;  Laterality: N/A;  . LATERAL EPICONDYLE RELEASE  10/11/2012   Procedure: TENNIS ELBOW RELEASE;  Surgeon: [name], MD;  Location: [facility];  Service: Orthopedics;  Laterality: Right;  RIGHT ELBOW: TENOTOMY ELBOW LATERAL  EPICONDYLITIS TENNIS ELBOW: RADIAL TUNNEL RELEASE  . PORT-A-CATH REMOVAL  04/13/2016   Procedure: MINOR REMOVAL PORT-A-CATH;  Surgeon: [name], MD;  Location: [facility];  Service: General;;  . PORTACATH PLACEMENT Right 04/05/2016   Procedure: INSERTION PORT-A-CATH WITH [place];  Surgeon: [name], MD;  Location: [facility];  Service: General;  Laterality: Right;  right IJ  . TOTAL THYROIDECTOMY  2001  . TUBAL LIGATION  94  . UPPER GI ENDOSCOPY     Social History   Socioeconomic History  . Marital status: Single    Spouse name: Not on file  . Number of children: Not on file  . Years of education: Not on file  . Highest education level: Not on file  Occupational History  . 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 Financial resource strain: Not on file  . Food insecurity:    Worry: Not on file  Inability: Not on file  . Transportation needs:    Medical: Not on file    Non-medical: Not on file  Tobacco Use  . Smoking status: Never Smoker  . Smokeless tobacco: Never Used  Substance and Sexual Activity  . Alcohol use: No  . Drug use: No  . Sexual activity: Not on file  Lifestyle  . Physical activity:    Days per week: Not on file    Minutes per session: Not on file  . Stress: Not on file  Relationships  . Social connections:    Talks on phone: Not on file    Gets together: Not on file    Attends religious service: Not on file    Active member of club or organization: Not on file    Attends meetings of clubs or organizations: Not on file    Relationship status: Not on file  . Intimate partner violence:    Fear of current or ex partner: Not on file    Emotionally abused: Not on file    Physically abused: Not on file    Forced sexual activity: Not on file  Other Topics Concern  . Not on file  Social History Narrative  . Not on file     Review of Systems: General: negative for chills, fever, night sweats or weight changes.  Cardiovascular: negative for  chest pain, dyspnea on exertion, edema, orthopnea, palpitations, paroxysmal nocturnal dyspnea or shortness of breath Dermatological: negative for rash Respiratory: negative for cough or wheezing Urologic: negative for hematuria Abdominal: negative for nausea, vomiting, diarrhea, bright red blood per rectum, melena, or hematemesis Neurologic: negative for visual changes, syncope, or dizziness All other systems reviewed and are otherwise negative except as noted above.   Physical Exam:  Blood pressure 110/84, pulse 79, height 5\' 8"  (1.727 m), weight 223 lb (101.2 kg).  General appearance: alert, cooperative and no distress Neck: no carotid bruit and no JVD Lungs: clear to auscultation bilaterally Heart: regular rate and rhythm, S1, S2 normal, no murmur, click, rub or gallop Extremities: extremities normal, atraumatic, no cyanosis or edema Pulses: 2+ and symmetric Skin: Skin color, texture, turgor normal. No rashes or lesions Neurologic: Grossly normal  EKG not performed -- personally reviewed   ASSESSMENT AND PLAN:   1. NICM/Chronic Systolic HF: EF stable at 30-35%, by recent echo. No change. She has an ICD in place for primary prevention. She is on a BB and Entresto. Unable to tolerate spironolactone. She feels better on the lower dose of Entresto. Less fatigue. BP remains stable. She denies dyspnea. She looks euvolemic on exam however she notes increased abdominal distention. We will check a BNP today to check volume status and will check a BMP for baseline assessment of renal function and electrolytes, in the event that we may need to adjust diuretics. For now, continue Lasix 20 mg daily and continue with low salt diet. Pt encouraged to start monitoring weight daily and to call our office if > 3 lb weight gain in 24 hrs or > 5 lb in 1 week.   Follow-Up w/ Dr. [name] in 6 months.   Deontre Allsup Ladoris Gene, MHS [facility] 04/04/2018 10:42 AM

## 2018-04-04 NOTE — Patient Instructions (Addendum)
Medication Instructions:  Your physician recommends that you continue on your current medications as directed. Please refer to the Current Medication list given to you today.   Labwork: TODAY:  BMET & PRO BNP  Testing/Procedures: None ordered  Follow-Up: Your physician wants you to follow-up in: [place] DR. Johann Capers will receive a reminder letter in the mail two months in advance. If you don't receive a letter, please call our office to schedule the follow-up appointment.   Any Other Special Instructions Will Be Listed Below (If Applicable). Try to check your weight at least 1 time a week, if you noticed a 5 lb increase in a week, call our office.    If you need a refill on your cardiac medications before your next appointment, please call your pharmacy.

## 2018-05-04 ENCOUNTER — Ambulatory Visit: Payer: BLUE CROSS/BLUE SHIELD | Admitting: Physician Assistant

## 2018-05-04 ENCOUNTER — Encounter: Payer: Self-pay | Admitting: Physician Assistant

## 2018-05-04 VITALS — BP 110/77 | HR 70 | Temp 98.7°F | Ht 68.0 in | Wt 216.2 lb

## 2018-05-04 DIAGNOSIS — Z0000 Encounter for general adult medical examination without abnormal findings: Secondary | ICD-10-CM | POA: Diagnosis not present

## 2018-05-04 DIAGNOSIS — E039 Hypothyroidism, unspecified: Secondary | ICD-10-CM | POA: Diagnosis not present

## 2018-05-04 DIAGNOSIS — J329 Chronic sinusitis, unspecified: Secondary | ICD-10-CM

## 2018-05-04 MED ORDER — AMOXICILLIN 500 MG PO CAPS: 500.0000 mg | ORAL_CAPSULE | Freq: Three times a day (TID) | ORAL | 0 refills | Status: DC

## 2018-05-04 NOTE — Progress Notes (Signed)
BP 110/77   Pulse 70   Temp 98.7 F (37.1 C) (Oral)   Ht _0  (1.727 m)   Wt 216 lb 3.2 oz (98.1 kg)   BMI 32.87 kg/m    Subjective:    Patient ID: [name] [name], female    DOB: [birth year], 57 y.o.   MRN: [id number]  HPI: [name] [name] is a 57 y.o. female presenting on 05/04/2018 for Fatigue and Nausea  Patient comes in for symptoms of nausea and fatigue.  She states she is ran some mild fevers at times.  She has not vomited.  She has not had any diarrhea.  She states her sinuses do feel a lot of pressure.  She denies any blood.  She is little bit of burning in her bronchial tubes but no wheezing.  She does not have a productive cough.  She is having more cough over the last couple of days.  Past Medical History:  Diagnosis Date  . AICD (automatic cardioverter/defibrillator) present 09   [employer]- replaced 2016  . Arthritis   . Bradycardia   . Breast cancer ([place]) 02/24/2016   s/p lumpectomy and radiation but refused chemotherapy  . Chronic systolic CHF (congestive heart failure) ([place])   . Cough due to angiotensin-converting enzyme inhibitor    But patient chooses to continue ACE  . Dizziness    Dizziness with standing after squatting, March, 2012  . Dyslipidemia    LDL elevation,Patient does not want statin  . Family history of breast cancer   . GERD (gastroesophageal reflux disease)   . H/O shortness of breath    CPX 10/18:  No evidence of cardiopulmonary limitation. Suspect exercise intolerance related to weight and deconditioning.   [name] Hypothyroidism   . ICD (implantable cardiac defibrillator) battery depletion    Dr. [name], June, 2009(artifactual atrial tachycardia response events addressed by reprogramming in parentheses  . Leg swelling    right leg  . Nonischemic cardiomyopathy ([place])    Etiology unknown, diagnosed 2008, patient has never had catheterization or nuclear study as of March, 2011  . Personal history of radiation therapy 2017  . TIA (transient  ischemic attack)    No CT or MRI abnormality, aspirin therapy   Relevant past medical, surgical, family and social history reviewed and updated as indicated. Interim medical history since our last visit reviewed. Allergies and medications reviewed and updated. DATA REVIEWED: CHART IN EPIC  Family History reviewed for pertinent findings.  Review of Systems  Constitutional: Positive for chills, fatigue and fever. Negative for activity change and appetite change.  HENT: Positive for congestion, postnasal drip, sinus pressure and sore throat.   Eyes: Negative.   Respiratory: Positive for cough. Negative for wheezing.   Cardiovascular: Negative.  Negative for chest pain, palpitations and leg swelling.  Gastrointestinal: Negative.   Genitourinary: Negative.   Musculoskeletal: Negative.   Skin: Negative.   Neurological: Positive for headaches.    Allergies as of 05/04/2018      Reactions   Avelox [moxifloxacin Hcl In Nacl] Shortness Of Breath   Shortness of breath   Clindamycin/lincomycin Shortness Of Breath, Diarrhea   Breathing/diarrhea   Kiwi Extract Anaphylaxis   Makes pt. Feel like her throat is closing   Bactrim [sulfamethoxazole-trimethoprim] Swelling   Levofloxacin Other (See Comments)   JOINT PAIN JOINT PAIN   Moxifloxacin Other (See Comments)   Breathing problems Breathing problems   Spironolactone Other (See Comments)   burning   Sulfa Antibiotics Swelling  Medication List        Accurate as of 05/04/18  3:32 PM. Always use your most recent med list.          albuterol 108 (90 Base) MCG/ACT inhaler Commonly known as:  PROVENTIL HFA;VENTOLIN HFA Inhale 2 puffs into the lungs every 6 (six) hours as needed for wheezing or shortness of breath.   amitriptyline 10 MG tablet Commonly known as:  ELAVIL Take 10 mg by mouth daily.   amoxicillin 500 MG capsule Commonly known as:  AMOXIL Take 1 capsule (500 mg total) by mouth 3 (three) times daily.   aspirin EC  81 MG tablet Take 1 tablet (81 mg total) by mouth daily.   carvedilol 12.5 MG tablet Commonly known as:  COREG TAKE 1 TABLET BY MOUTH  TWICE A DAY WITH A MEAL   dicyclomine 10 MG capsule Commonly known as:  BENTYL Take 10 mg by mouth as directed.   fluticasone 50 MCG/ACT nasal spray Commonly known as:  FLONASE Place 2 sprays into both nostrils daily.   furosemide 20 MG tablet Commonly known as:  LASIX TAKE 1 TABLET BY MOUTH ONCE DAILY   levothyroxine 88 MCG tablet Commonly known as:  SYNTHROID, LEVOTHROID Take 1 tablet (88 mcg total) by mouth daily.   omeprazole 40 MG capsule Commonly known as:  PRILOSEC Take 40 mg by mouth daily.   PROBIOTIC PO Take 2 tablets by mouth daily.   sacubitril-valsartan 24-26 MG Commonly known as:  ENTRESTO Take 1 tablet by mouth 2 (two) times daily.          Objective:    BP 110/77   Pulse 70   Temp 98.7 F (37.1 C) (Oral)   Ht 5' 8" (1.727 m)   Wt 216 lb 3.2 oz (98.1 kg)   BMI 32.87 kg/m   Allergies  Allergen Reactions  . Avelox [Moxifloxacin Hcl In Nacl] Shortness Of Breath    Shortness of breath  . Clindamycin/Lincomycin Shortness Of Breath and Diarrhea    Breathing/diarrhea    . Kiwi Extract Anaphylaxis    Makes pt. Feel like her throat is closing   . Bactrim [Sulfamethoxazole-Trimethoprim] Swelling  . Levofloxacin Other (See Comments)    JOINT PAIN JOINT PAIN  . Moxifloxacin Other (See Comments)    Breathing problems Breathing problems  . Spironolactone Other (See Comments)    burning  . Sulfa Antibiotics Swelling    Wt Readings from Last 3 Encounters:  05/04/18 216 lb 3.2 oz (98.1 kg)  04/04/18 223 lb (101.2 kg)  01/01/18 222 lb (100.7 kg)    Physical Exam  Constitutional: She is oriented to person, place, and time. She appears well-developed and well-nourished.  HENT:  Head: Normocephalic and atraumatic.  Right Ear: A middle ear effusion is present.  Left Ear: A middle ear effusion is present.    Nose: Mucosal edema present. Right sinus exhibits no frontal sinus tenderness. Left sinus exhibits no frontal sinus tenderness.  Mouth/Throat: Posterior oropharyngeal erythema present. No oropharyngeal exudate or tonsillar abscesses.  Eyes: Pupils are equal, round, and reactive to light. Conjunctivae and EOM are normal.  Neck: Normal range of motion.  Cardiovascular: Normal rate, regular rhythm, normal heart sounds and intact distal pulses.  Pulmonary/Chest: Effort normal and breath sounds normal.  Abdominal: Soft. Bowel sounds are normal.  Neurological: She is alert and oriented to person, place, and time. She has normal reflexes.  Skin: Skin is warm and dry. No rash noted.  Psychiatric: She has a normal  mood and affect. Her behavior is normal. Judgment and thought content normal.  Nursing note and vitals reviewed.   Results for orders placed or performed in visit on 04/04/18  Pro b natriuretic peptide (BNP)  Result Value Ref Range   NT-Pro BNP 209 0 - 287 pg/mL  Basic metabolic panel  Result Value Ref Range   Glucose 82 65 - 99 mg/dL   BUN 11 6 - 24 mg/dL   Creatinine, Ser 0.78 0.57 - 1.00 mg/dL   GFR calc non Af Amer 85 >59 mL/min/1.73   GFR calc Af Amer 98 >59 mL/min/1.73   BUN/Creatinine Ratio 14 9 - 23   Sodium 143 134 - 144 mmol/L   Potassium 4.1 3.5 - 5.2 mmol/L   Chloride 106 96 - 106 mmol/L   CO2 26 20 - 29 mmol/L   Calcium 9.1 8.7 - 10.2 mg/dL      Assessment & Plan:   1. Well adult exam - CBC with Differential/Platelet - CMP14+EGFR - Lipid panel - TSH  2. Hypothyroidism, unspecified type - CBC with Differential/Platelet - CMP14+EGFR - Lipid panel - TSH  3. Sinusitis, unspecified chronicity, unspecified location - amoxicillin (AMOXIL) 500 MG capsule; Take 1 capsule (500 mg total) by mouth 3 (three) times daily.  Dispense: 30 capsule; Refill: 0   Continue all other maintenance medications as listed above.  Follow up plan: No follow-ups on  file.  Educational handout given for Cane Savannah PA-C [place] [address]  [place], [place] [address] [phone]   05/04/2018, 3:32 PM

## 2018-05-05 LAB — CMP14+EGFR
ALK PHOS: 54 IU/L (ref 39–117)
ALT: 8 IU/L (ref 0–32)
AST: 12 IU/L (ref 0–40)
Albumin/Globulin Ratio: 1.7 (ref 1.2–2.2)
Albumin: 4 g/dL (ref 3.5–5.5)
BUN/Creatinine Ratio: 13 (ref 9–23)
BUN: 12 mg/dL (ref 6–24)
CHLORIDE: 105 mmol/L (ref 96–106)
CO2: 27 mmol/L (ref 20–29)
CREATININE: 0.89 mg/dL (ref 0.57–1.00)
Calcium: 9.3 mg/dL (ref 8.7–10.2)
GFR calc Af Amer: 83 mL/min/{1.73_m2} (ref >59)
GFR calc non Af Amer: 72 mL/min/{1.73_m2} (ref >59)
GLUCOSE: 77 mg/dL (ref 65–99)
Globulin, Total: 2.3 g/dL (ref 1.5–4.5)
Potassium: 4.5 mmol/L (ref 3.5–5.2)
Sodium: 145 mmol/L — ABNORMAL HIGH (ref 134–144)
Total Protein: 6.3 g/dL (ref 6.0–8.5)

## 2018-05-05 LAB — CBC WITH DIFFERENTIAL/PLATELET
Basophils Absolute: 0 10*3/uL (ref 0.0–0.2)
Basos: 1 %
EOS (ABSOLUTE): 0.4 10*3/uL (ref 0.0–0.4)
Eos: 10 %
Hematocrit: 36 % (ref 34.0–46.6)
Hemoglobin: 11.8 g/dL (ref 11.1–15.9)
IMMATURE GRANS (ABS): 0 10*3/uL (ref 0.0–0.1)
Immature Granulocytes: 0 %
LYMPHS: 43 %
Lymphocytes Absolute: 1.7 10*3/uL (ref 0.7–3.1)
MCH: 29.5 pg (ref 26.6–33.0)
MCHC: 32.8 g/dL (ref 31.5–35.7)
MCV: 90 fL (ref 79–97)
MONOCYTES: 6 %
Monocytes Absolute: 0.2 10*3/uL (ref 0.1–0.9)
NEUTROS ABS: 1.6 10*3/uL (ref 1.4–7.0)
Neutrophils: 40 %
PLATELETS: 209 10*3/uL (ref 150–450)
RBC: 4 x10E6/uL (ref 3.77–5.28)
RDW: 13.8 % (ref 12.3–15.4)
WBC: 3.9 10*3/uL (ref 3.4–10.8)

## 2018-05-05 LAB — LIPID PANEL
CHOLESTEROL TOTAL: 209 mg/dL — AB (ref 100–199)
Chol/HDL Ratio: 3.2 ratio (ref 0.0–4.4)
HDL: 66 mg/dL (ref >39)
LDL Calculated: 129 mg/dL — ABNORMAL HIGH (ref 0–99)
Triglycerides: 69 mg/dL (ref 0–149)
VLDL CHOLESTEROL CAL: 14 mg/dL (ref 5–40)

## 2018-05-05 LAB — TSH: TSH: 3.37 u[IU]/mL (ref 0.450–4.500)

## 2018-05-10 ENCOUNTER — Other Ambulatory Visit: Payer: Self-pay | Admitting: Physician Assistant

## 2018-05-10 ENCOUNTER — Telehealth: Payer: Self-pay | Admitting: Physician Assistant

## 2018-05-10 MED ORDER — FLUCONAZOLE 150 MG PO TABS: 150.0000 mg | ORAL_TABLET | Freq: Once | ORAL | 0 refills | Status: AC

## 2018-05-10 MED ORDER — AZITHROMYCIN 250 MG PO TABS: ORAL_TABLET | ORAL | 0 refills | Status: DC

## 2018-05-10 NOTE — Telephone Encounter (Signed)
Pt states she is still having congestion and feels like she is gagging on drainage and gets nauseated. She also has yeast infection now from the amoxicillin. Can we send in diflucan for yeast and a different antibiotic for her congestion? Please advise.

## 2018-05-10 NOTE — Telephone Encounter (Signed)
Left message on VM that requested medications were sent into pharmacy and to call back with any further questions or concerns.

## 2018-05-10 NOTE — Telephone Encounter (Signed)
Zithromax is sent and Diflucan is sent.

## 2018-05-11 ENCOUNTER — Telehealth: Payer: Self-pay | Admitting: Cardiology

## 2018-05-11 MED ORDER — CARVEDILOL 6.25 MG PO TABS: 6.2500 mg | ORAL_TABLET | Freq: Two times a day (BID) | ORAL | 0 refills | Status: DC

## 2018-05-11 NOTE — Telephone Encounter (Signed)
Please ask to retry entresto 26/24 mg PO BID and decrease carvedilol to 6.25 mg po BID, if she still feel the same we will d/c entresto and switch to losartan

## 2018-05-11 NOTE — Telephone Encounter (Signed)
Left the pt a message to call the office back, to receive recommendations per Dr [name].

## 2018-05-11 NOTE — Telephone Encounter (Signed)
Dr. [name], pt has complaints of feeling fatigued, tired, and sluggish while taking Entresto 24-26 mg po bid.  She is also on carvedilol 12.5 mg po bid. Pt is unsure which med is causing her symptoms. Pt reports that the time line of events are as mentioned below:  She started feeling super fatigued and sluggish last Tuesday 05/01/18 at work. She states she felt run down and had no energy at all. Pt states her symptoms carried over from Tuesday 7/9 until Saturday 7/13.  Pt reports on Sunday 7/14 she stopped taking Entresto all together.   Pt states she has been off of Entresto since Sunday 7/14, and her symptoms have much improved.  Pt states she went to see her PCP last Friday 7/12, and her BP was 110/77 and HR was 70 (you can see note in Epic). Pt would like advise on what she should do as far as med management.   Should she remain off of Entresto, or should you advise on decreasing her carvedilol, or advising on a different regimen all together?  Informed the pt that I would route this message to you for further review and recommendation and follow-up with her shortly thereafter.  Pt just saw Ellen Henri PA-C on 04/04/18, where she had similar complaints of feeling fatigued.  Please advise!

## 2018-05-11 NOTE — Telephone Encounter (Signed)
New message    Pt c/o medication issue:  1. Name of Medication: sacubitril-valsartan (ENTRESTO) 24-26 MG  2. How are you currently taking this medication (dosage and times per day)? Take 1 tablet by mouth 2 (two) times daily.  3. Are you having a reaction (difficulty breathing--STAT)?Patient states she feels sluggish   4. What is your medication issue? Patient wants to discontinue taking med

## 2018-05-11 NOTE — Telephone Encounter (Signed)
Follow up     Return call to patient

## 2018-05-11 NOTE — Telephone Encounter (Signed)
Spoke with the pt and informed her that Dr [name] recommends that she retry Entresto 24/26 mg po bid, but let's decrease her carvedilol to 6.25 mg po bid, and see if that improves or not.  Informed the pt that if she still feels the same, we will d/c Entresto and switch her to losartan.  Informed the pt that I will send in only a month supply of the decreased carvedilol 6.25 mg to her confirmed pharmacy of choice, to see if she tolerates this appropriately. Pt verbalized understanding and agrees with this plan.

## 2018-05-12 DIAGNOSIS — N760 Acute vaginitis: Secondary | ICD-10-CM | POA: Diagnosis not present

## 2018-05-12 DIAGNOSIS — J209 Acute bronchitis, unspecified: Secondary | ICD-10-CM | POA: Diagnosis not present

## 2018-05-14 DIAGNOSIS — R072 Precordial pain: Secondary | ICD-10-CM | POA: Diagnosis not present

## 2018-05-14 DIAGNOSIS — R634 Abnormal weight loss: Secondary | ICD-10-CM | POA: Diagnosis not present

## 2018-05-14 DIAGNOSIS — R110 Nausea: Secondary | ICD-10-CM | POA: Diagnosis not present

## 2018-05-16 ENCOUNTER — Ambulatory Visit (INDEPENDENT_AMBULATORY_CARE_PROVIDER_SITE_OTHER): Payer: BLUE CROSS/BLUE SHIELD | Admitting: *Deleted

## 2018-05-16 DIAGNOSIS — I428 Other cardiomyopathies: Secondary | ICD-10-CM | POA: Diagnosis not present

## 2018-05-16 NOTE — Progress Notes (Signed)
Remote ICD transmission.   

## 2018-05-17 DIAGNOSIS — I10 Essential (primary) hypertension: Secondary | ICD-10-CM | POA: Diagnosis not present

## 2018-05-17 DIAGNOSIS — R630 Anorexia: Secondary | ICD-10-CM | POA: Diagnosis not present

## 2018-05-17 DIAGNOSIS — E079 Disorder of thyroid, unspecified: Secondary | ICD-10-CM | POA: Diagnosis not present

## 2018-05-17 DIAGNOSIS — Z7951 Long term (current) use of inhaled steroids: Secondary | ICD-10-CM | POA: Diagnosis not present

## 2018-05-17 DIAGNOSIS — K2970 Gastritis, unspecified, without bleeding: Secondary | ICD-10-CM | POA: Diagnosis not present

## 2018-05-17 DIAGNOSIS — K2950 Unspecified chronic gastritis without bleeding: Secondary | ICD-10-CM | POA: Diagnosis not present

## 2018-05-17 DIAGNOSIS — R112 Nausea with vomiting, unspecified: Secondary | ICD-10-CM | POA: Diagnosis not present

## 2018-05-17 DIAGNOSIS — K219 Gastro-esophageal reflux disease without esophagitis: Secondary | ICD-10-CM | POA: Diagnosis not present

## 2018-05-17 DIAGNOSIS — Z881 Allergy status to other antibiotic agents status: Secondary | ICD-10-CM | POA: Diagnosis not present

## 2018-05-17 DIAGNOSIS — Z95810 Presence of automatic (implantable) cardiac defibrillator: Secondary | ICD-10-CM | POA: Diagnosis not present

## 2018-05-17 DIAGNOSIS — Z8673 Personal history of transient ischemic attack (TIA), and cerebral infarction without residual deficits: Secondary | ICD-10-CM | POA: Diagnosis not present

## 2018-05-17 DIAGNOSIS — Z853 Personal history of malignant neoplasm of breast: Secondary | ICD-10-CM | POA: Diagnosis not present

## 2018-05-17 DIAGNOSIS — Z7982 Long term (current) use of aspirin: Secondary | ICD-10-CM | POA: Diagnosis not present

## 2018-05-17 DIAGNOSIS — R634 Abnormal weight loss: Secondary | ICD-10-CM | POA: Diagnosis not present

## 2018-05-17 DIAGNOSIS — Z79899 Other long term (current) drug therapy: Secondary | ICD-10-CM | POA: Diagnosis not present

## 2018-05-17 DIAGNOSIS — Z888 Allergy status to other drugs, medicaments and biological substances status: Secondary | ICD-10-CM | POA: Diagnosis not present

## 2018-05-17 DIAGNOSIS — Z91018 Allergy to other foods: Secondary | ICD-10-CM | POA: Diagnosis not present

## 2018-05-17 DIAGNOSIS — Z882 Allergy status to sulfonamides status: Secondary | ICD-10-CM | POA: Diagnosis not present

## 2018-06-08 LAB — CUP PACEART REMOTE DEVICE CHECK
Battery Remaining Longevity: 114 mo
Battery Remaining Percentage: 100 %
Brady Statistic RA Percent Paced: 0 %
HighPow Impedance: 54 Ohm
Implantable Lead Implant Date: 20090320
Implantable Lead Location: 753859
Implantable Lead Location: 753860
Implantable Lead Model: 158
Implantable Lead Model: 5076
Implantable Lead Serial Number: 182504
Lead Channel Impedance Value: 544 Ohm
Lead Channel Pacing Threshold Amplitude: 0.9 V
Lead Channel Setting Pacing Amplitude: 2 V
Lead Channel Setting Pacing Pulse Width: 0.4 ms
MDC IDC LEAD IMPLANT DT: 20090320
MDC IDC MSMT LEADCHNL RA PACING THRESHOLD AMPLITUDE: 0.4 V
MDC IDC MSMT LEADCHNL RA PACING THRESHOLD PULSEWIDTH: 0.4 ms
MDC IDC MSMT LEADCHNL RV IMPEDANCE VALUE: 536 Ohm
MDC IDC MSMT LEADCHNL RV PACING THRESHOLD PULSEWIDTH: 0.4 ms
MDC IDC PG IMPLANT DT: 20160307
MDC IDC PG SERIAL: 111826
MDC IDC SESS DTM: 20190722233000
MDC IDC SET LEADCHNL RV PACING AMPLITUDE: 2.5 V
MDC IDC SET LEADCHNL RV SENSING SENSITIVITY: 0.6 mV
MDC IDC STAT BRADY RV PERCENT PACED: 0 %

## 2018-06-12 ENCOUNTER — Other Ambulatory Visit: Payer: Self-pay | Admitting: Physician Assistant

## 2018-06-14 ENCOUNTER — Other Ambulatory Visit: Payer: Self-pay | Admitting: Cardiology

## 2018-06-14 MED ORDER — CARVEDILOL 6.25 MG PO TABS: 6.2500 mg | ORAL_TABLET | Freq: Two times a day (BID) | ORAL | 3 refills | Status: DC

## 2018-06-22 DIAGNOSIS — B354 Tinea corporis: Secondary | ICD-10-CM | POA: Diagnosis not present

## 2018-06-22 DIAGNOSIS — J309 Allergic rhinitis, unspecified: Secondary | ICD-10-CM | POA: Diagnosis not present

## 2018-06-26 ENCOUNTER — Other Ambulatory Visit: Payer: Self-pay | Admitting: Cardiology

## 2018-06-26 DIAGNOSIS — I5022 Chronic systolic (congestive) heart failure: Secondary | ICD-10-CM

## 2018-06-27 ENCOUNTER — Telehealth: Payer: Self-pay | Admitting: Hematology and Oncology

## 2018-06-27 ENCOUNTER — Inpatient Hospital Stay: Payer: BLUE CROSS/BLUE SHIELD | Attending: Hematology and Oncology | Admitting: Hematology and Oncology

## 2018-06-27 DIAGNOSIS — Z923 Personal history of irradiation: Secondary | ICD-10-CM

## 2018-06-27 DIAGNOSIS — R1310 Dysphagia, unspecified: Secondary | ICD-10-CM | POA: Diagnosis not present

## 2018-06-27 DIAGNOSIS — Z853 Personal history of malignant neoplasm of breast: Secondary | ICD-10-CM | POA: Diagnosis not present

## 2018-06-27 DIAGNOSIS — Z171 Estrogen receptor negative status [ER-]: Secondary | ICD-10-CM

## 2018-06-27 DIAGNOSIS — R0789 Other chest pain: Secondary | ICD-10-CM | POA: Diagnosis not present

## 2018-06-27 DIAGNOSIS — C50212 Malignant neoplasm of upper-inner quadrant of left female breast: Secondary | ICD-10-CM

## 2018-06-27 NOTE — Telephone Encounter (Signed)
Gave pt avs and calendar  °

## 2018-06-27 NOTE — Assessment & Plan Note (Signed)
Left lumpectomy 04/05/2016: IDC grade 3, 2.3 cm, associated high-grade DCIS, margins negative, 0/1 lymph node, T2 N0 stage II a, ER 0%, PR 0%, HER-2 negative ratio 1.38 Patient refused adjuvant chemotherapy (patient fully understands that she is at very high risk of recurrence) Received adjuvant radiation from 05/03/2016 to 06/16/2016  Surveillance of breast cancer: 1. Breast exams  06/27/2018: Benign 2. Annual mammograms  03/07/2018: No abnormalities. Density cat C  Return to clinic in one year for surveillance checkups and follow-up with long-term survivorship

## 2018-06-27 NOTE — Progress Notes (Signed)
Patient Care Team: Theodoro Clock as PCP - General (General Practice) Nicholas Lose, MD as Consulting Physician (Hematology and Oncology) Kyung Rudd, MD as Consulting Physician (Radiation Oncology) [name], MD as Consulting Physician (General Surgery)  DIAGNOSIS:  Encounter Diagnosis  Name Primary?  . Malignant neoplasm of upper-inner quadrant of left breast in female, estrogen receptor negative ([place])     SUMMARY OF ONCOLOGIC HISTORY: Oncology History         Breast cancer of upper-inner quadrant of left female breast ([place])   02/24/2016 Mammogram    Left breast mass 11:00 position 4 cm from nipple, 2.4 x 1.6 x 1.9 cm, single axillary lymph node noted, T2 N0 stage IIA clinical stage ([place])    02/24/2016 Initial Diagnosis    Moderate to poorly differentiated IDC, lymph node biopsy negative, ER 0%, PR 0%, HER-2, 2+ by IHC, fish negative, ratio 1.2, Ki-67 59%     04/05/2016 Surgery    Left lumpectomy [name]): IDC grade 3, 2.3 cm, associated high-grade DCIS, margins negative, 0/1 lymph node, T2 N0 stage II a, ER 0%, PR 0%, HER-2 negative ratio 1.38     Chemotherapy    Patient refused adjuvant chemotherapy    05/03/2016 - 06/16/2016 Radiation Therapy    Adjuvant radiation [name]). Left breast boost // 10 Gy with 5 fractions at a dose of 2 Gy/fraction.  Left breast// 50.4 Gy with 28 fractions at a dose of 1.8 Gy/ fraction    10/28/2016 Genetic Testing    Patient has genetic testing done for personal and family history of breast cancer. ATM [id number] VUS found on genetic testing.  The Breast/GYN gene panel offered by [facility] includes sequencing and rearrangement analysis for the following 23 genes:  ATM, BARD1, BRCA1, BRCA2, BRIP1, CDH1, CHEK2, EPCAM, FANCC, MLH1, MSH2, MSH6, MUTYH, NBN, NF1, PALB2, PMS2, POLD1, PTEN, RAD51C, RAD51D, RECQL, and TP53.   Negative genetic testing for the MSH2 inversion analysis ([name]).      CHIEF COMPLIANT: Follow-up of breast  cancer, multiple complaints including chest pressure, dysphagia  INTERVAL HISTORY: [name] [name] is a 57 year old with above-mentioned history of left breast cancer triple negative disease who underwent lumpectomy with diffuse adjuvant chemotherapy.  She did radiation and is currently on surveillance.  Since July she has been having multiple problems including chest pressure as well as difficulty with swallowing.  She had upper endoscopy which was apparently normal.  She is also seen her cardiologist and had a stress test which was normal.  She is also complaining of a rash on her legs bilaterally with itching which could be eczema.  She is got sinus congestion and is going to see an ENT doctor.  REVIEW OF SYSTEMS:   Constitutional: Denies fevers, chills or abnormal weight loss Eyes: Denies blurriness of vision Ears, nose, mouth, throat, and face: Sinus congestion Respiratory: Denies cough, dyspnea or wheezes Cardiovascular: Denies palpitation, chest discomfort Gastrointestinal: Acid reflux and difficulty swallowing Skin: Denies abnormal skin rashes Lymphatics: Denies new lymphadenopathy or easy bruising Neurological:Denies numbness, tingling or new weaknesses Behavioral/Psych: Mood is stable, no new changes  Extremities: No lower extremity edema Breast:  denies any pain or lumps or nodules in either breasts All other systems were reviewed with the patient and are negative.  I have reviewed the past medical history, past surgical history, social history and family history with the patient and they are unchanged from previous note.  ALLERGIES:  is allergic to avelox [moxifloxacin hcl in nacl]; clindamycin/lincomycin; kiwi extract;  bactrim [sulfamethoxazole-trimethoprim]; levofloxacin; moxifloxacin; spironolactone; and sulfa antibiotics.  MEDICATIONS:  Current Outpatient Medications  Medication Sig Dispense Refill  . albuterol (PROVENTIL HFA;VENTOLIN HFA) 108 (90 Base) MCG/ACT inhaler Inhale 2  puffs into the lungs every 6 (six) hours as needed for wheezing or shortness of breath. 1 Inhaler 2  . amitriptyline (ELAVIL) 10 MG tablet Take 10 mg by mouth daily.     [name] aspirin EC 81 MG tablet Take 1 tablet (81 mg total) by mouth daily. 90 tablet 3  . azithromycin (ZITHROMAX Z-PAK) 250 MG tablet Take as directed 6 each 0  . carvedilol (COREG) 6.25 MG tablet Take 1 tablet (6.25 mg total) by mouth 2 (two) times daily. 180 tablet 3  . dicyclomine (BENTYL) 10 MG capsule Take 10 mg by mouth as directed.    . fluticasone (FLONASE) 50 MCG/ACT nasal spray Place 2 sprays into both nostrils daily. 16 g 6  . furosemide (LASIX) 20 MG tablet TAKE 1 TABLET BY MOUTH ONCE DAILY 90 tablet 3  . levothyroxine (SYNTHROID, LEVOTHROID) 88 MCG tablet TAKE 1 TABLET BY MOUTH  DAILY 90 tablet 3  . omeprazole (PRILOSEC) 40 MG capsule Take 40 mg by mouth daily.    . Probiotic Product (PROBIOTIC PO) Take 2 tablets by mouth daily.     . sacubitril-valsartan (ENTRESTO) 24-26 MG Take 1 tablet by mouth 2 (two) times daily. 60 tablet 4   No current facility-administered medications for this visit.     PHYSICAL EXAMINATION: ECOG PERFORMANCE STATUS: 1 - Symptomatic but completely ambulatory  Vitals:   06/27/18 1025  BP: 116/73  Pulse: 85  Resp: 18  Temp: 98.5 F (36.9 C)  SpO2: 98%   Filed Weights   06/27/18 1025  Weight: 212 lb 11.2 oz (96.5 kg)    GENERAL:alert, no distress and comfortable SKIN: skin color, texture, turgor are normal, no rashes or significant lesions EYES: normal, Conjunctiva are pink and non-injected, sclera clear OROPHARYNX:no exudate, no erythema and lips, buccal mucosa, and tongue normal  NECK: supple, thyroid normal size, non-tender, without nodularity LYMPH:  no palpable lymphadenopathy in the cervical, axillary or inguinal LUNGS: clear to auscultation and percussion with normal breathing effort HEART: regular rate & rhythm and no murmurs and no lower extremity edema ABDOMEN:abdomen  soft, non-tender and normal bowel sounds MUSCULOSKELETAL:no cyanosis of digits and no clubbing  NEURO: alert & oriented x 3 with fluent speech, no focal motor/sensory deficits EXTREMITIES: No lower extremity edema BREAST: No palpable masses or nodules in either right or left breasts. No palpable axillary supraclavicular or infraclavicular adenopathy no breast tenderness or nipple discharge. (exam performed in the presence of a chaperone)  LABORATORY DATA:  I have reviewed the data as listed CMP Latest Ref Rng & Units 05/04/2018 04/04/2018 05/26/2017  Glucose 65 - 99 mg/dL 77 82 77  BUN 6 - 24 mg/dL _0 Creatinine 0.57 - 1.00 mg/dL 0.89 0.78 0.92  Sodium 134 - 144 mmol/L 145(H) 143 144  Potassium 3.5 - 5.2 mmol/L 4.5 4.1 4.2  Chloride 96 - 106 mmol/L 105 106 104  CO2 20 - 29 mmol/L _1 Calcium 8.7 - 10.2 mg/dL 9.3 9.1 9.6  Total Protein 6.0 - 8.5 g/dL 6.3 - -  Total Bilirubin 0.0 - 1.2 mg/dL <0.2 - -  Alkaline Phos 39 - 117 IU/L 54 - -  AST 0 - 40 IU/L 12 - -  ALT 0 - 32 IU/L 8 - -    Lab Results  Component Value Date   WBC 3.9 05/04/2018   HGB 11.8 05/04/2018   HCT 36.0 05/04/2018   MCV 90 05/04/2018   PLT 209 05/04/2018   NEUTROABS 1.6 05/04/2018    ASSESSMENT & PLAN:  Breast cancer of upper-inner quadrant of left female breast ([place]) Left lumpectomy 04/05/2016: IDC grade 3, 2.3 cm, associated high-grade DCIS, margins negative, 0/1 lymph node, T2 N0 stage II a, ER 0%, PR 0%, HER-2 negative ratio 1.38 Patient refused adjuvant chemotherapy (patient fully understands that she is at very high risk of recurrence) Received adjuvant radiation from 05/03/2016 to 06/16/2016  Surveillance of breast cancer: 1. Breast exams  06/27/2018: Benign 2. Annual mammograms  03/07/2018: No abnormalities. Density cat C  Chest and swallowing symptoms: Her primary care physician and gastroenterology and cardiology have been working her up.  She is an appointment with ENT.  I discussed with  her that if no clear-cut etiology could be identified then we may consider doing a CT scan of the chest. She will call us after her ENT appointments and treatments to inform us if she does not get any improvement then we may order the CT scan.  Return to clinic in one year for surveillance checkups and follow-up with long-term survivorship   No orders of the defined types were placed in this encounter.  The patient has a good understanding of the overall plan. she agrees with it. she will call with any problems that may develop before the next visit here.   Harriette Ohara, MD 06/27/18

## 2018-06-29 ENCOUNTER — Ambulatory Visit: Payer: BLUE CROSS/BLUE SHIELD | Admitting: Hematology and Oncology

## 2018-07-11 DIAGNOSIS — J310 Chronic rhinitis: Secondary | ICD-10-CM | POA: Diagnosis not present

## 2018-07-11 DIAGNOSIS — R070 Pain in throat: Secondary | ICD-10-CM | POA: Diagnosis not present

## 2018-07-11 DIAGNOSIS — K219 Gastro-esophageal reflux disease without esophagitis: Secondary | ICD-10-CM | POA: Diagnosis not present

## 2018-07-11 DIAGNOSIS — J342 Deviated nasal septum: Secondary | ICD-10-CM | POA: Diagnosis not present

## 2018-08-15 ENCOUNTER — Ambulatory Visit (INDEPENDENT_AMBULATORY_CARE_PROVIDER_SITE_OTHER): Payer: BLUE CROSS/BLUE SHIELD | Admitting: *Deleted

## 2018-08-15 DIAGNOSIS — I428 Other cardiomyopathies: Secondary | ICD-10-CM | POA: Diagnosis not present

## 2018-08-15 NOTE — Progress Notes (Signed)
Remote ICD transmission.   

## 2018-08-17 ENCOUNTER — Telehealth: Payer: Self-pay | Admitting: Physician Assistant

## 2018-08-17 NOTE — Telephone Encounter (Signed)
Patient complains of muscle spasms at night and chills at night, chest fullness for 3 months.  Patient would like to have thyroid rechecked

## 2018-08-19 ENCOUNTER — Other Ambulatory Visit: Payer: Self-pay | Admitting: Physician Assistant

## 2018-08-19 DIAGNOSIS — R5383 Other fatigue: Secondary | ICD-10-CM

## 2018-08-19 NOTE — Telephone Encounter (Signed)
This is okay to come and do, I will place an order

## 2018-08-20 ENCOUNTER — Other Ambulatory Visit: Payer: BLUE CROSS/BLUE SHIELD

## 2018-08-20 DIAGNOSIS — R5383 Other fatigue: Secondary | ICD-10-CM | POA: Diagnosis not present

## 2018-08-20 NOTE — Telephone Encounter (Signed)
Pt aware.

## 2018-08-21 LAB — THYROID PANEL WITH TSH
FREE THYROXINE INDEX: 2.1 (ref 1.2–4.9)
T3 UPTAKE RATIO: 24 % (ref 24–39)
T4, Total: 8.6 ug/dL (ref 4.5–12.0)
TSH: 2.27 u[IU]/mL (ref 0.450–4.500)

## 2018-08-22 DIAGNOSIS — K219 Gastro-esophageal reflux disease without esophagitis: Secondary | ICD-10-CM | POA: Diagnosis not present

## 2018-08-22 DIAGNOSIS — R070 Pain in throat: Secondary | ICD-10-CM | POA: Diagnosis not present

## 2018-09-04 ENCOUNTER — Encounter: Payer: Self-pay | Admitting: Family

## 2018-09-04 ENCOUNTER — Ambulatory Visit: Payer: BLUE CROSS/BLUE SHIELD | Admitting: Family

## 2018-09-04 VITALS — BP 119/75 | HR 62 | Temp 98.5°F | Ht 68.0 in | Wt 217.0 lb

## 2018-09-04 DIAGNOSIS — J019 Acute sinusitis, unspecified: Secondary | ICD-10-CM | POA: Diagnosis not present

## 2018-09-04 MED ORDER — AMOXICILLIN-POT CLAVULANATE 875-125 MG PO TABS: 1.0000 | ORAL_TABLET | Freq: Two times a day (BID) | ORAL | 0 refills | Status: DC

## 2018-09-04 NOTE — Patient Instructions (Signed)

## 2018-09-04 NOTE — Progress Notes (Signed)
   Subjective:    Patient ID: [name] [name], female    DOB: [birth year], 57 y.o.   MRN: [id number]  Chief Complaint  Patient presents with  . Sinus Problem    Sinus Problem  This is a new problem. The current episode started 1 to 4 weeks ago. The problem has been waxing and waning since onset. Her pain is at a severity of 8/10. The pain is mild. Associated symptoms include congestion, coughing, headaches, a hoarse voice, sinus pressure and sneezing. Pertinent negatives include no chills, ear pain or sore throat. Past treatments include oral decongestants. The treatment provided mild relief.      Review of Systems  Constitutional: Negative for chills.  HENT: Positive for congestion, hoarse voice, sinus pressure and sneezing. Negative for ear pain and sore throat.   Respiratory: Positive for cough.   Neurological: Positive for headaches.  All other systems reviewed and are negative.      Objective:   Physical Exam  Constitutional: She is oriented to person, place, and time. She appears well-developed and well-nourished. No distress.  HENT:  Head: Normocephalic and atraumatic.  Right Ear: External ear normal.  Left Ear: External ear normal.  Nose: Mucosal edema and rhinorrhea present. Right sinus exhibits maxillary sinus tenderness. Left sinus exhibits maxillary sinus tenderness.  Mouth/Throat: Posterior oropharyngeal erythema present.  Eyes: Pupils are equal, round, and reactive to light.  Neck: Normal range of motion. Neck supple. No thyromegaly present.  Cardiovascular: Normal rate, regular rhythm, normal heart sounds and intact distal pulses.  No murmur heard. Pulmonary/Chest: Effort normal and breath sounds normal. No respiratory distress. She has no wheezes.  Abdominal: Soft. Bowel sounds are normal. She exhibits no distension. There is no tenderness.  Musculoskeletal: Normal range of motion. She exhibits no edema or tenderness.  Neurological: She is alert and oriented to  person, place, and time. She has normal reflexes. No cranial nerve deficit.  Skin: Skin is warm and dry.  Psychiatric: She has a normal mood and affect. Her behavior is normal. Judgment and thought content normal.  Vitals reviewed.     BP 119/75   Pulse 62   Temp 98.5 F (36.9 C) (Oral)   Ht 5\' 8"  (1.727 m)   Wt 217 lb (98.4 kg)   BMI 32.99 kg/m      Assessment & Plan:  [name] [name] comes in today with chief complaint of Sinus Problem   Diagnosis and orders addressed:  1. Acute sinusitis, recurrence not specified, unspecified location - Take meds as prescribed - Use a cool mist humidifier  -Use saline nose sprays frequently -Force fluids -For any cough or congestion  Use plain Mucinex- regular strength or max strength is fine -For fever or aces or pains- take tylenol or ibuprofen. -Throat lozenges if help -RTO if symptoms worsen or do not improve  - amoxicillin-clavulanate (AUGMENTIN) 875-125 MG tablet; Take 1 tablet by mouth 2 (two) times daily.  Dispense: 14 tablet; Refill: 0  Evelina Dun, FNP

## 2018-09-09 DIAGNOSIS — M25561 Pain in right knee: Secondary | ICD-10-CM | POA: Diagnosis not present

## 2018-09-09 DIAGNOSIS — Z6832 Body mass index (BMI) 32.0-32.9, adult: Secondary | ICD-10-CM | POA: Diagnosis not present

## 2018-09-10 IMAGING — MG 2D DIGITAL DIAGNOSTIC BILATERAL MAMMOGRAM WITH CAD AND ADJUNCT T
9 of 16 series · 9 of 36 positions shown · non-contrast
Comparison: Previous exam(s).

CLINICAL DATA: Patient with history of left breast lumpectomy 6247.

EXAM:
2D DIGITAL DIAGNOSTIC BILATERAL MAMMOGRAM WITH CAD AND ADJUNCT TOMO

[L CC (1 of 2)]
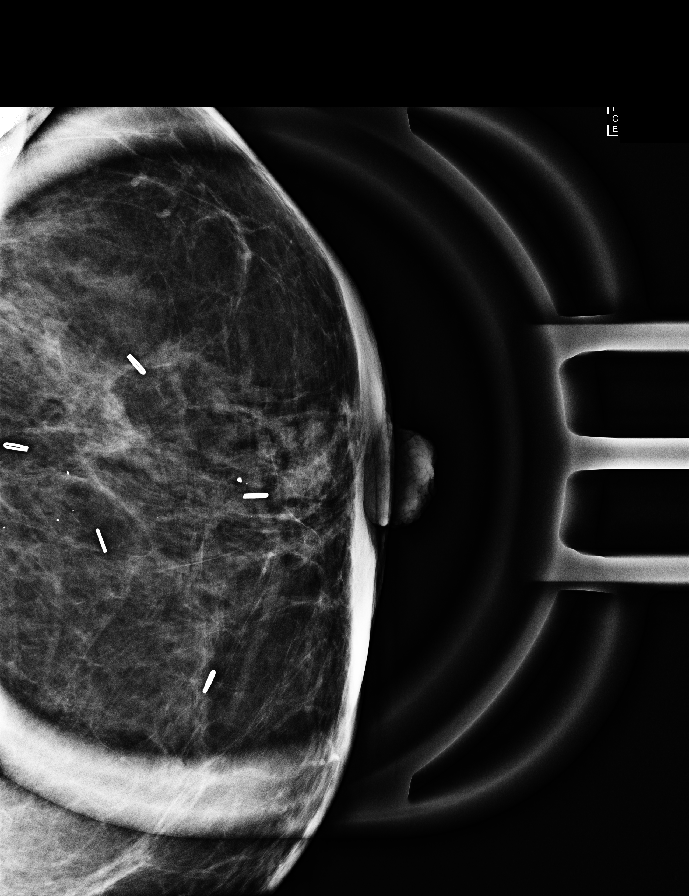

[L MLO (1 of 2)]
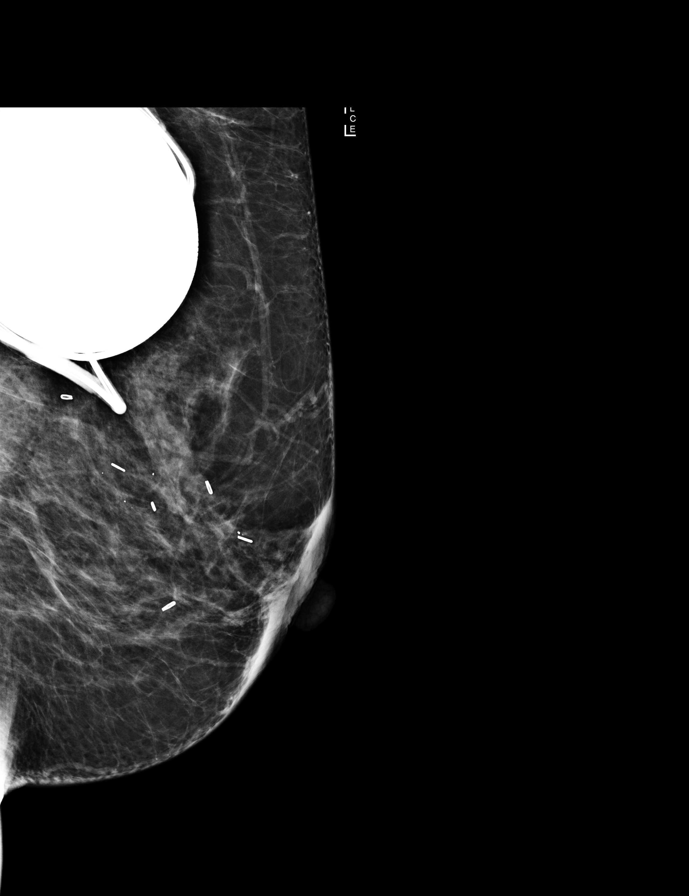

[R MLO]
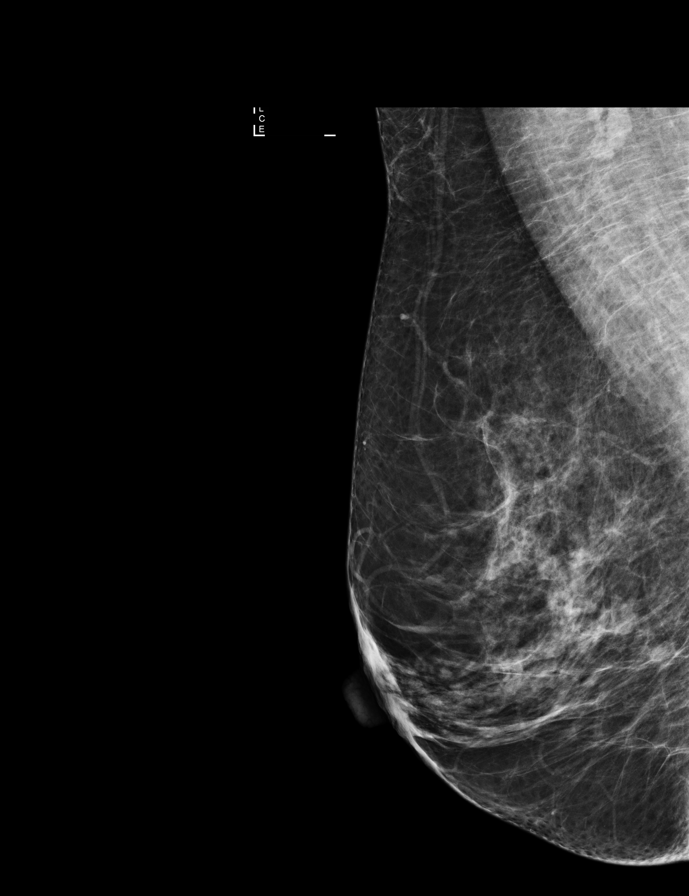

[R CC]
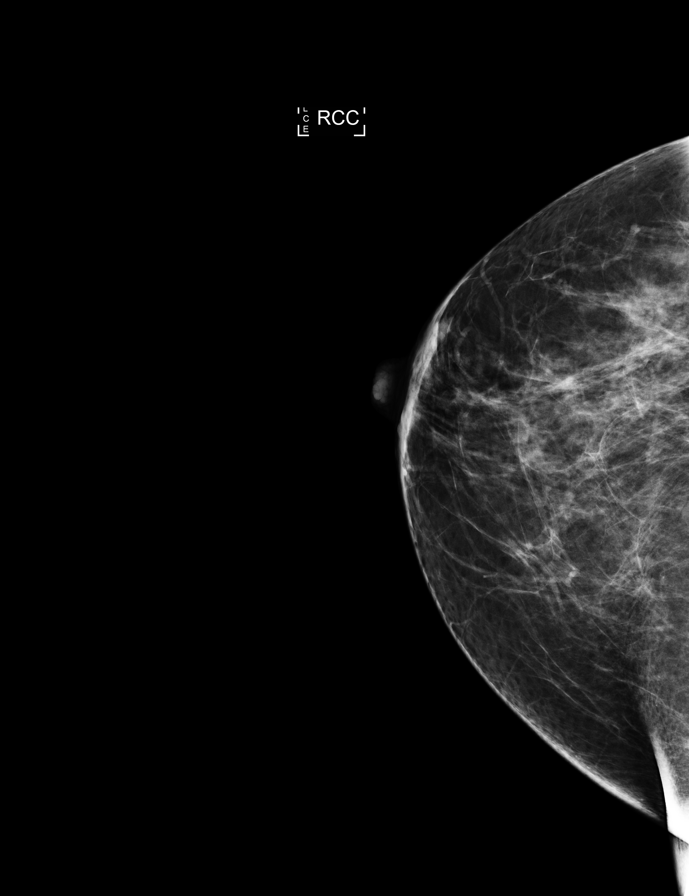

[L MLO synth-2D (1 of 2)]
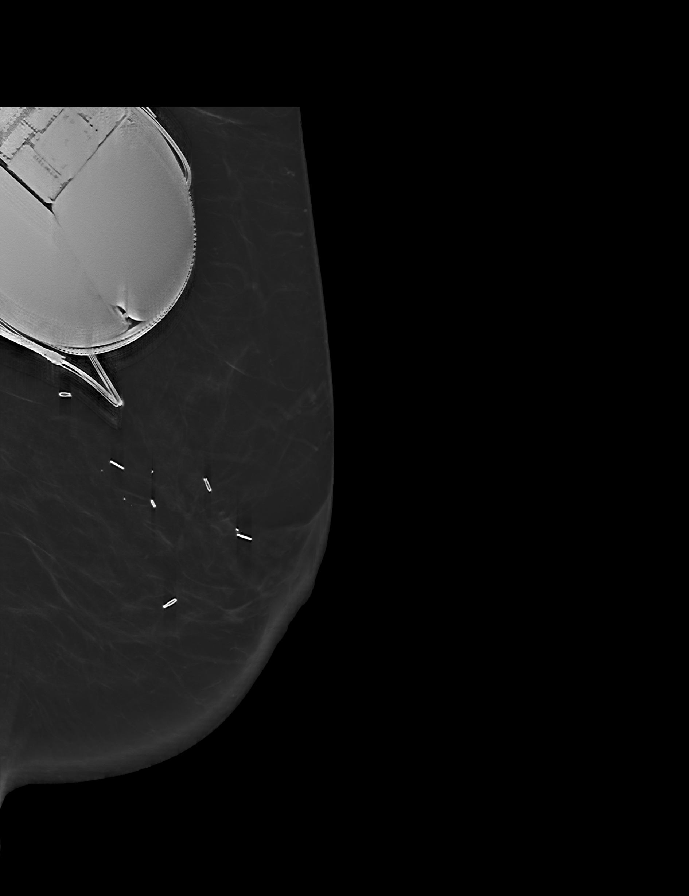

[L MLO synth-2D (2 of 2)]
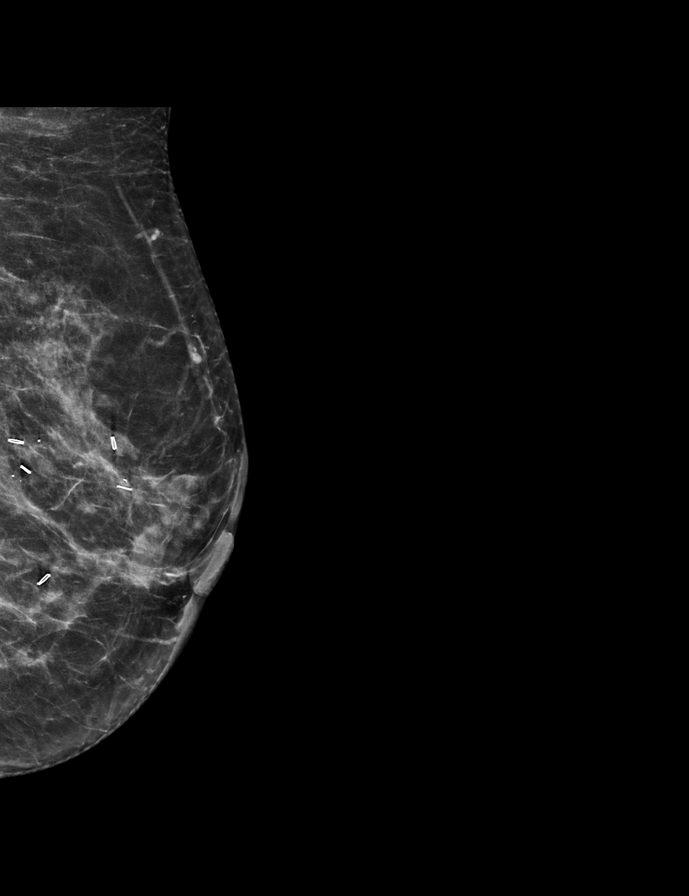

[L CC (2 of 2)]
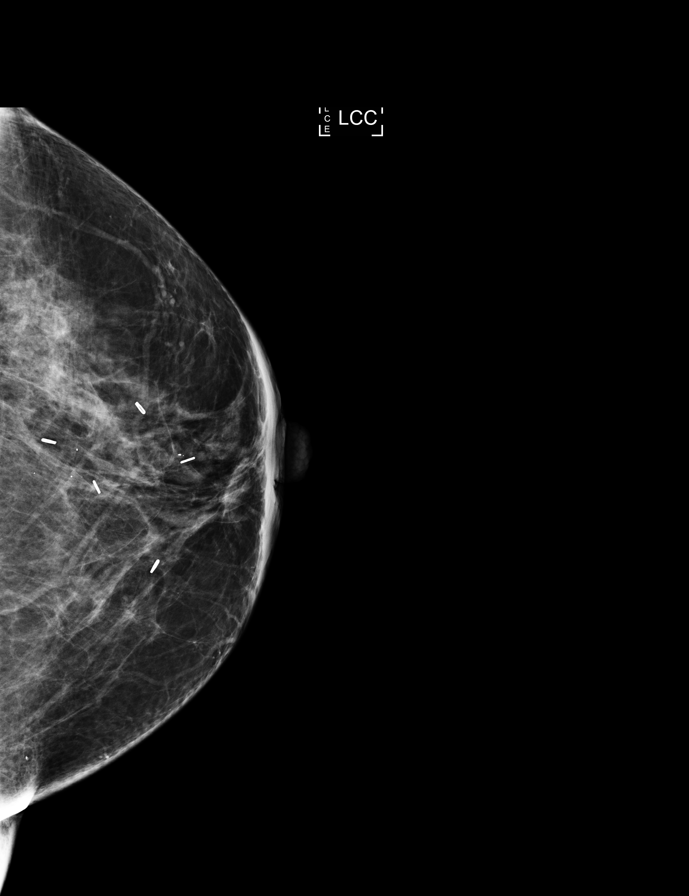

[L MLO (2 of 2)]
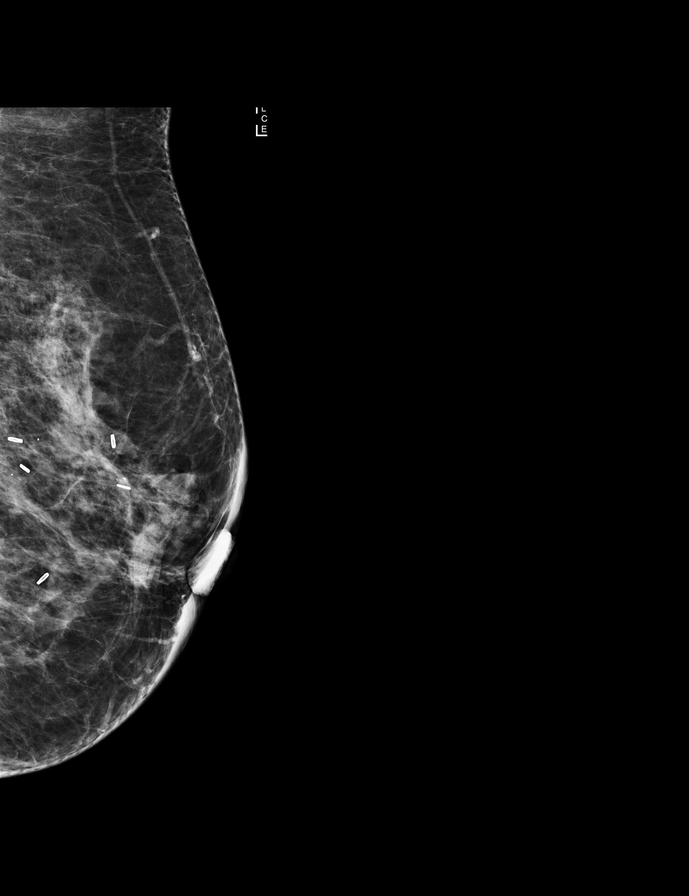

[R CC synth-2D]
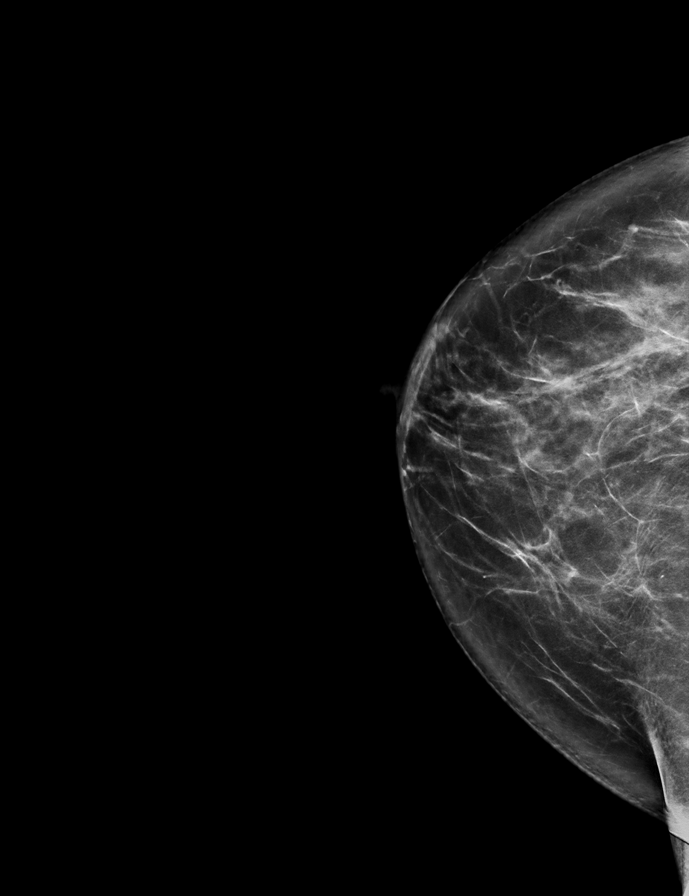

[9 of 36 positions shown; findings below may reference images not displayed]

ACR Breast Density Category c: The breast tissue is heterogeneously
dense, which may obscure small masses.
FINDINGS: Interval postlumpectomy changes left breast. No concerning masses,
calcifications or nonsurgical distortion identified within either
breast.

Mammographic images were processed with CAD.
IMPRESSION: No mammographic evidence for malignancy

RECOMMENDATION:
Bilateral diagnostic mammography in 1 year

I have discussed the findings and recommendations with the patient.
Results were also provided in writing at the conclusion of the
visit. If applicable, a reminder letter will be sent to the patient
regarding the next appointment.

BI-RADS CATEGORY  2: Benign.

## 2018-09-14 DIAGNOSIS — M1711 Unilateral primary osteoarthritis, right knee: Secondary | ICD-10-CM | POA: Diagnosis not present

## 2018-09-14 DIAGNOSIS — M25561 Pain in right knee: Secondary | ICD-10-CM | POA: Diagnosis not present

## 2018-10-20 LAB — CUP PACEART REMOTE DEVICE CHECK
Implantable Lead Implant Date: 20090320
Implantable Lead Location: 753860
Implantable Lead Model: 158
Implantable Lead Model: 5076
Implantable Pulse Generator Implant Date: 20160307
MDC IDC LEAD IMPLANT DT: 20090320
MDC IDC LEAD LOCATION: 753859
MDC IDC LEAD SERIAL: 182504
MDC IDC SESS DTM: 20191228175325
Pulse Gen Serial Number: 111826

## 2018-10-31 DIAGNOSIS — H9202 Otalgia, left ear: Secondary | ICD-10-CM | POA: Diagnosis not present

## 2018-10-31 DIAGNOSIS — H6982 Other specified disorders of Eustachian tube, left ear: Secondary | ICD-10-CM | POA: Diagnosis not present

## 2018-10-31 DIAGNOSIS — J00 Acute nasopharyngitis [common cold]: Secondary | ICD-10-CM | POA: Diagnosis not present

## 2018-10-31 DIAGNOSIS — Z6832 Body mass index (BMI) 32.0-32.9, adult: Secondary | ICD-10-CM | POA: Diagnosis not present

## 2018-11-06 DIAGNOSIS — K219 Gastro-esophageal reflux disease without esophagitis: Secondary | ICD-10-CM | POA: Diagnosis not present

## 2018-11-06 DIAGNOSIS — H9122 Sudden idiopathic hearing loss, left ear: Secondary | ICD-10-CM | POA: Diagnosis not present

## 2018-11-06 DIAGNOSIS — H9042 Sensorineural hearing loss, unilateral, left ear, with unrestricted hearing on the contralateral side: Secondary | ICD-10-CM | POA: Diagnosis not present

## 2018-11-14 ENCOUNTER — Ambulatory Visit (INDEPENDENT_AMBULATORY_CARE_PROVIDER_SITE_OTHER): Payer: BLUE CROSS/BLUE SHIELD

## 2018-11-14 DIAGNOSIS — Z1339 Encounter for screening examination for other mental health and behavioral disorders: Secondary | ICD-10-CM | POA: Diagnosis not present

## 2018-11-14 DIAGNOSIS — I5022 Chronic systolic (congestive) heart failure: Secondary | ICD-10-CM

## 2018-11-14 DIAGNOSIS — I428 Other cardiomyopathies: Secondary | ICD-10-CM | POA: Diagnosis not present

## 2018-11-14 DIAGNOSIS — E669 Obesity, unspecified: Secondary | ICD-10-CM | POA: Diagnosis not present

## 2018-11-14 DIAGNOSIS — Z1331 Encounter for screening for depression: Secondary | ICD-10-CM | POA: Diagnosis not present

## 2018-11-14 DIAGNOSIS — Z008 Encounter for other general examination: Secondary | ICD-10-CM | POA: Diagnosis not present

## 2018-11-15 LAB — CUP PACEART REMOTE DEVICE CHECK
HighPow Impedance: 51 Ohm
Implantable Lead Implant Date: 20090320
Implantable Lead Location: 753859
Implantable Lead Model: 158
Implantable Lead Serial Number: 182504
Implantable Pulse Generator Implant Date: 20160307
Lead Channel Impedance Value: 566 Ohm
Lead Channel Pacing Threshold Amplitude: 0.9 V
Lead Channel Pacing Threshold Pulse Width: 0.4 ms
Lead Channel Setting Pacing Amplitude: 2 V
Lead Channel Setting Sensing Sensitivity: 0.6 mV
MDC IDC LEAD IMPLANT DT: 20090320
MDC IDC LEAD LOCATION: 753860
MDC IDC MSMT BATTERY REMAINING LONGEVITY: 102 mo
MDC IDC MSMT BATTERY REMAINING PERCENTAGE: 100 %
MDC IDC MSMT LEADCHNL RA IMPEDANCE VALUE: 503 Ohm
MDC IDC MSMT LEADCHNL RA PACING THRESHOLD AMPLITUDE: 0.4 V
MDC IDC MSMT LEADCHNL RA PACING THRESHOLD PULSEWIDTH: 0.4 ms
MDC IDC PG SERIAL: 111826
MDC IDC SESS DTM: 20200122075100
MDC IDC SET LEADCHNL RV PACING AMPLITUDE: 2.5 V
MDC IDC SET LEADCHNL RV PACING PULSEWIDTH: 0.4 ms
MDC IDC STAT BRADY RA PERCENT PACED: 0 %
MDC IDC STAT BRADY RV PERCENT PACED: 0 %

## 2018-11-15 NOTE — Progress Notes (Signed)
Remote ICD transmission.   

## 2018-11-16 ENCOUNTER — Encounter: Payer: Self-pay | Admitting: Cardiology

## 2018-11-28 DIAGNOSIS — H9312 Tinnitus, left ear: Secondary | ICD-10-CM | POA: Diagnosis not present

## 2018-11-28 DIAGNOSIS — H9042 Sensorineural hearing loss, unilateral, left ear, with unrestricted hearing on the contralateral side: Secondary | ICD-10-CM | POA: Diagnosis not present

## 2018-12-17 DIAGNOSIS — M9902 Segmental and somatic dysfunction of thoracic region: Secondary | ICD-10-CM | POA: Diagnosis not present

## 2018-12-17 DIAGNOSIS — M9901 Segmental and somatic dysfunction of cervical region: Secondary | ICD-10-CM | POA: Diagnosis not present

## 2018-12-17 DIAGNOSIS — M9903 Segmental and somatic dysfunction of lumbar region: Secondary | ICD-10-CM | POA: Diagnosis not present

## 2018-12-17 DIAGNOSIS — M62830 Muscle spasm of back: Secondary | ICD-10-CM | POA: Diagnosis not present

## 2018-12-18 ENCOUNTER — Encounter: Payer: BLUE CROSS/BLUE SHIELD | Admitting: Internal Medicine

## 2018-12-21 ENCOUNTER — Ambulatory Visit: Payer: BLUE CROSS/BLUE SHIELD | Admitting: Family Medicine

## 2018-12-21 ENCOUNTER — Encounter: Payer: Self-pay | Admitting: Family Medicine

## 2018-12-21 VITALS — BP 132/74 | HR 60 | Temp 97.8°F | Ht 68.0 in | Wt 216.6 lb

## 2018-12-21 DIAGNOSIS — M5432 Sciatica, left side: Secondary | ICD-10-CM

## 2018-12-21 MED ORDER — METHYLPREDNISOLONE ACETATE 80 MG/ML IJ SUSP
80.0000 mg | Freq: Once | INTRAMUSCULAR | Status: AC
  Administered 2018-12-21: 80 mg via INTRAMUSCULAR

## 2018-12-21 NOTE — Progress Notes (Signed)
BP 132/74   Pulse 60   Temp 97.8 F (36.6 C) (Oral)   Ht 5\' 8"  (1.727 m)   Wt 216 lb 9.6 oz (98.2 kg)   BMI 32.93 kg/m    Subjective:    Patient ID: [name] [name], female    DOB: 20-Sep-1961, 58 y.o.   MRN: [id number]  HPI: [name] [name] is a 58 y.o. female presenting on 12/21/2018 for Back Pain (x 2 weeks) and Flank Pain (left . Patient states it shoots down leg)   HPI Left lower back pain radiating around to the groin and left leg Patient comes in complaining of 2 weeks worth of left lower back pain that radiates around sometimes to her left groin and left anterior thigh but most of the pain is centered around her left lower back/buttocks.  She cannot recall any specific traumatic incident.  She has been taking Aleve over the past couple weeks and it is just not helping.  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  Relevant past medical, surgical, family and social history reviewed and updated as indicated. Interim medical history since our last visit reviewed. Allergies and medications reviewed and updated.  Review of Systems  Constitutional: Negative for chills and fever.  Eyes: Negative for visual disturbance.  Respiratory: Negative for chest tightness and shortness of breath.   Cardiovascular: Negative for chest pain and leg swelling.  Gastrointestinal: Negative for abdominal pain.  Genitourinary: Negative for dysuria.  Musculoskeletal: Positive for back pain and myalgias. Negative for gait problem, neck pain and neck stiffness.  Skin: Negative for rash.  Neurological: Negative for light-headedness and headaches.  Psychiatric/Behavioral: Negative for agitation and behavioral problems.  All other systems reviewed and are negative.   Per HPI unless specifically indicated above   Allergies as of 12/21/2018      Reactions   Avelox  [moxifloxacin Hcl In Nacl] Shortness Of Breath   Shortness of breath   Clindamycin/lincomycin Shortness Of Breath, Diarrhea   Breathing/diarrhea   Kiwi Extract Anaphylaxis   Makes pt. Feel like her throat is closing   Bactrim [sulfamethoxazole-trimethoprim] Swelling   Levofloxacin Other (See Comments)   JOINT PAIN JOINT PAIN   Moxifloxacin Other (See Comments)   Breathing problems Breathing problems   Spironolactone Other (See Comments)   burning   Sulfa Antibiotics Swelling      Medication List       Accurate as of December 21, 2018  3:06 PM. Always use your most recent med list.        aspirin EC 81 MG tablet Take 1 tablet (81 mg total) by mouth daily.   carvedilol 6.25 MG tablet Commonly known as:  COREG Take 1 tablet (6.25 mg total) by mouth 2 (two) times daily.   dicyclomine 10 MG capsule Commonly known as:  BENTYL Take 10 mg by mouth as directed.   ENTRESTO 24-26 MG Generic drug:  sacubitril-valsartan TAKE 1 TABLET BY MOUTH TWICE DAILY   famotidine 40 MG tablet Commonly known as:  PEPCID Take 40 mg by mouth at bedtime.   furosemide 20 MG tablet Commonly known as:  LASIX TAKE 1 TABLET BY MOUTH ONCE DAILY   ipratropium 0.06 % nasal spray Commonly known as:  ATROVENT USE 2 DOSES IN EACH NOSTRIL TWICE DAILY AS NEEDED FOR DRAINAGE   levothyroxine 88  MCG tablet Commonly known as:  SYNTHROID, LEVOTHROID TAKE 1 TABLET BY MOUTH  DAILY   omeprazole 40 MG capsule Commonly known as:  PRILOSEC Take 40 mg by mouth daily.   PROBIOTIC PO Take 2 tablets by mouth daily.          Objective:    BP 132/74   Pulse 60   Temp 97.8 F (36.6 C) (Oral)   Ht 5\' 8"  (1.727 m)   Wt 216 lb 9.6 oz (98.2 kg)   BMI 32.93 kg/m   Wt Readings from Last 3 Encounters:  12/21/18 216 lb 9.6 oz (98.2 kg)  09/04/18 217 lb (98.4 kg)  06/27/18 212 lb 11.2 oz (96.5 kg)    Physical Exam Vitals signs and nursing note reviewed.  Constitutional:      General: She is not in  acute distress.    Appearance: She is well-developed. She is not diaphoretic.  Eyes:     Conjunctiva/sclera: Conjunctivae normal.  Cardiovascular:     Rate and Rhythm: Normal rate and regular rhythm.     Heart sounds: Normal heart sounds.  Musculoskeletal: Normal range of motion.     Lumbar back: She exhibits tenderness (Left lower back/buttock tenderness, no radiation and negative straight leg raise). She exhibits normal range of motion, no bony tenderness, no swelling, no edema and no deformity.  Skin:    General: Skin is warm and dry.     Findings: No rash.  Neurological:     Mental Status: She is alert and oriented to person, place, and time.     Coordination: Coordination normal.  Psychiatric:        Behavior: Behavior normal.        Assessment & Plan:   Problem List Items Addressed This Visit    None    Visit Diagnoses    Left sciatic nerve pain    -  Primary   Relevant Medications   methylPREDNISolone acetate (DEPO-MEDROL) injection 80 mg (Completed) (Start on 12/21/2018  3:15 PM)       Follow up plan: Return if symptoms worsen or fail to improve.  Counseling provided for all of the vaccine components No orders of the defined types were placed in this encounter.   Caryl Pina, MD [facility] 12/21/2018, 3:06 PM

## 2018-12-26 DIAGNOSIS — M9902 Segmental and somatic dysfunction of thoracic region: Secondary | ICD-10-CM | POA: Diagnosis not present

## 2018-12-26 DIAGNOSIS — M62830 Muscle spasm of back: Secondary | ICD-10-CM | POA: Diagnosis not present

## 2018-12-26 DIAGNOSIS — M9903 Segmental and somatic dysfunction of lumbar region: Secondary | ICD-10-CM | POA: Diagnosis not present

## 2018-12-26 DIAGNOSIS — M9901 Segmental and somatic dysfunction of cervical region: Secondary | ICD-10-CM | POA: Diagnosis not present

## 2018-12-28 ENCOUNTER — Other Ambulatory Visit: Payer: Self-pay | Admitting: Physician Assistant

## 2019-01-08 ENCOUNTER — Telehealth: Payer: Self-pay

## 2019-01-08 NOTE — Telephone Encounter (Signed)
LVM for pt regarding her upcoming 3/23 appt with Dr [name].

## 2019-01-09 NOTE — Telephone Encounter (Signed)
Spoke with pt to assess her needs. She states she is doing well and has no needs at this time. She states she does not need any refills at this time.   This would be pt's yearly follow up. She agrees to postpone her annual follow up due to COVID-19 outbreak. She understands she will receive a call from scheduling in the future to reschedule. She understands she may call the office in the meantime if anything changes.

## 2019-01-14 ENCOUNTER — Telehealth: Payer: BLUE CROSS/BLUE SHIELD | Admitting: Family Medicine

## 2019-01-14 ENCOUNTER — Encounter: Payer: BLUE CROSS/BLUE SHIELD | Admitting: Internal Medicine

## 2019-01-14 ENCOUNTER — Telehealth: Payer: Self-pay | Admitting: Physician Assistant

## 2019-01-17 ENCOUNTER — Telehealth: Payer: Self-pay | Admitting: Cardiology

## 2019-01-17 NOTE — Telephone Encounter (Signed)
Based on the chart review please schedule a telemedicine visit.  Thank you,  Ena Dawley, MD

## 2019-01-17 NOTE — Telephone Encounter (Signed)
Left the pt a message to call the office back to discuss her upcoming appt with Dr [name] for 4/2, and arranging this as a telephone visit or WebEx visit.

## 2019-01-21 ENCOUNTER — Telehealth: Payer: Self-pay

## 2019-01-21 ENCOUNTER — Telehealth: Payer: Self-pay | Admitting: Cardiology

## 2019-01-21 NOTE — Telephone Encounter (Signed)
Left the pt another VM to call the office back with simple instructions to ask the operator to connect her to myself, for we will need to talk to each other on the phone, before arranging this type of visit, for it takes instruction on how to help her prepare.

## 2019-01-21 NOTE — Telephone Encounter (Signed)
Left the pt another VM to call the office back with simple instructions to ask the operator to connect her to myself, for we will need to talk to each other on the phone, before arranging this type of visit, for it takes instruction on how to help her prepare.  

## 2019-01-21 NOTE — Telephone Encounter (Signed)
Virtual Visit Pre-Appointment Phone Call  Steps For Call: Pt will be [place] ON 01/24/19.  1. Confirm consent - "In the setting of the current Covid19 crisis, you are scheduled for a (phone ) visit with your Dr. [name] on Thursday 01/24/19 at 11 am.  Just as we do with many in-office visits, in order for you to participate in this visit, we must obtain consent.  If you'd like, I can send this to your mychart (if signed up) or email for you to review.  Otherwise, I can obtain your verbal consent now.  All virtual visits are billed to your insurance company just like a normal visit would be.  By agreeing to a virtual visit, we'd like you to understand that the technology does not allow for your provider to perform an examination, and thus may limit your provider's ability to fully assess your condition.  Finally, though the technology is pretty good, we cannot assure that it will always work on either your or our end, and in the setting of a video visit, we may have to convert it to a phone-only visit.  In either situation, we cannot ensure that we have a secure connection.  Are you willing to proceed?"  2. Give patient instructions for WebEx download to smartphone as below if video visit  3. Advise patient to be prepared with any vital sign or heart rhythm information, their current medicines, and a piece of paper and pen handy for any instructions they may receive the day of their visit  4. Inform patient they will receive a phone call 15 minutes prior to their appointment time (may be from unknown caller ID) so they should be prepared to answer  5. Confirm that appointment type is correct in Epic appointment notes ( telephone)    TELEPHONE CALL NOTE  [place] has been deemed a candidate for a follow-up tele-health visit to limit community exposure during the Covid-19 pandemic. I spoke with the patient via phone to ensure availability of phone/video source, confirm  preferred email & phone number, and discuss instructions and expectations.  I reminded [name] [name] to be prepared with any vital sign and/or heart rhythm information that could potentially be obtained via home monitoring, at the time of her visit. I reminded [name] [name] to expect a phone call at the time of her visit if her visit.  Did the patient verbally acknowledge consent to treatment? PT GAVE VERBAL CONSENT ON 01/21/19 AT 3:12 PM TO DO A TELEPHONE VISIT WITH DR NELSON ON 01/24/19 AT 11 AM.  PT STATES SHE DOES NOT HAVE A SMART PHONE OR COMPUTER AT THIS TIME.  PT STATES SHE DOES NOT HAVE A BP CUFF, BUT SHE DOES HAVE SCALES, AND SHE WILL WEIGH HERSELF THAT MORNING, WRITE THIS DOWN, AND WILL BE AVAILABLE FOR THE CMA TO CALL HER PRIOR TO THE VISIT, FOR THE ROOMING PROCESS.  [name], LPN 6/96/2952 8:41 PM     CONSENT FOR TELE-HEALTH VISIT - PLEASE REVIEW  I hereby voluntarily request, consent and authorize [facility] and its employed or contracted physicians, physician assistants, nurse practitioners or other licensed health care professionals (the Practitioner), to provide me with telemedicine health care services (the "Services") as deemed necessary by the treating Practitioner. I acknowledge and consent to receive the Services by the Practitioner via telemedicine. I understand that the telemedicine visit will involve communicating with the Practitioner through live audiovisual communication technology and  the disclosure of certain medical information by electronic transmission. I acknowledge that I have been given the opportunity to request an in-person assessment or other available alternative prior to the telemedicine visit and am voluntarily participating in the telemedicine visit.  I understand that I have the right to withhold or withdraw my consent to the use of telemedicine in the course of my care at any time, without affecting my right to future care or treatment, and that the  Practitioner or I may terminate the telemedicine visit at any time. I understand that I have the right to inspect all information obtained and/or recorded in the course of the telemedicine visit and may receive copies of available information for a reasonable fee.  I understand that some of the potential risks of receiving the Services via telemedicine include:  [name] Delay or interruption in medical evaluation due to technological equipment failure or disruption; . Information transmitted may not be sufficient (e.g. poor resolution of images) to allow for appropriate medical decision making by the Practitioner; and/or  . In rare instances, security protocols could fail, causing a breach of personal health information.  Furthermore, I acknowledge that it is my responsibility to provide information about my medical history, conditions and care that is complete and accurate to the best of my ability. I acknowledge that Practitioner's advice, recommendations, and/or decision may be based on factors not within their control, such as incomplete or inaccurate data provided by me or distortions of diagnostic images or specimens that may result from electronic transmissions. I understand that the practice of medicine is not an exact science and that Practitioner makes no warranties or guarantees regarding treatment outcomes. I acknowledge that I will receive a copy of this consent concurrently upon execution via email to the email address I last provided but may also request a printed copy by calling the office of [place].    I understand that my insurance will be billed for this visit.   I have read or had this consent read to me. . I understand the contents of this consent, which adequately explains the benefits and risks of the Services being provided via telemedicine.  . I have been provided ample opportunity to ask questions regarding this consent and the Services and have had my questions answered to my  satisfaction. . I give my informed consent for the services to be provided through the use of telemedicine in my medical care  By participating in this telemedicine visit I agree to the above.

## 2019-01-21 NOTE — Telephone Encounter (Signed)
Follow Up:    Pt says the Virtual Visit will be ine with her.

## 2019-01-21 NOTE — Telephone Encounter (Signed)
Left the pt another message to call the office back in regards to scheduling her VIDEO visit vs TELEPHONE visit with Dr [name], for 01/24/19.  Pt has been left several messages regarding this and has yet to return a call back.

## 2019-01-21 NOTE — Telephone Encounter (Signed)
Called pt to verify she's prepared for her upcoming evisit 01/24/2019 with [name]. Left voicemail asking pt to call the office.

## 2019-01-21 NOTE — Telephone Encounter (Signed)
3:14 PM  Note         Virtual Visit Pre-Appointment Phone Call  Steps For Call: Pt will be [place] ON 01/24/19.  1. Confirm consent - "In the setting of the current Covid19 crisis, you are scheduled for a (phone ) visit with your Dr. [name] on Thursday 01/24/19 at 11 am.  Just as we do with many in-office visits, in order for you to participate in this visit, we must obtain consent.  If you'd like, I can send this to your mychart (if signed up) or email for you to review.  Otherwise, I can obtain your verbal consent now.  All virtual visits are billed to your insurance company just like a normal visit would be.  By agreeing to a virtual visit, we'd like you to understand that the technology does not allow for your provider to perform an examination, and thus may limit your provider's ability to fully assess your condition.  Finally, though the technology is pretty good, we cannot assure that it will always work on either your or our end, and in the setting of a video visit, we may have to convert it to a phone-only visit.  In either situation, we cannot ensure that we have a secure connection.  Are you willing to proceed?"  2. Give patient instructions for WebEx download to smartphone as below if video visit  3. Advise patient to be prepared with any vital sign or heart rhythm information, their current medicines, and a piece of paper and pen handy for any instructions they may receive the day of their visit  4. Inform patient they will receive a phone call 15 minutes prior to their appointment time (may be from unknown caller ID) so they should be prepared to answer  5. Confirm that appointment type is correct in Epic appointment notes ( telephone)    TELEPHONE CALL NOTE  [place] has been deemed a candidate for a follow-up tele-health visit to limit community exposure during the Covid-19 pandemic. I spoke with the patient via phone to ensure  availability of phone/video source, confirm preferred email & phone number, and discuss instructions and expectations.  I reminded [name] [name] to be prepared with any vital sign and/or heart rhythm information that could potentially be obtained via home monitoring, at the time of her visit. I reminded [name] [name] to expect a phone call at the time of her visit if her visit.  Did the patient verbally acknowledge consent to treatment? PT GAVE VERBAL CONSENT ON 01/21/19 AT 3:12 PM TO DO A TELEPHONE VISIT WITH DR NELSON ON 01/24/19 AT 11 AM.  PT STATES SHE DOES NOT HAVE A SMART PHONE OR COMPUTER AT THIS TIME.  PT STATES SHE DOES NOT HAVE A BP CUFF, BUT SHE DOES HAVE SCALES, AND SHE WILL WEIGH HERSELF THAT MORNING, WRITE THIS DOWN, AND WILL BE AVAILABLE FOR THE CMA TO CALL HER PRIOR TO THE VISIT, FOR THE ROOMING PROCESS.  [name], LPN 12/05/9415 4:08 PM     CONSENT FOR TELE-HEALTH VISIT - PLEASE REVIEW  I hereby voluntarily request, consent and authorize [facility] and its employed or contracted physicians, physician assistants, nurse practitioners or other licensed health care professionals (the Practitioner), to provide me with telemedicine health care services (the "Services") as deemed necessary by the treating Practitioner.I acknowledge and consent to receive the Services by the Practitioner via telemedicine. I understand that the telemedicine visit  will involve communicating with the Practitioner through live audiovisual communication technology and the disclosure of certain medical information by electronic transmission. I acknowledge that I have been given the opportunity to request an in-person assessment or other available alternative prior to the telemedicine visit and am voluntarily participating in the telemedicine visit.  I understand that I have the right to withhold or withdraw my consent to the use of telemedicine in the course of my care at any time, without affecting my  right to future care or treatment, and that the Practitioner or I may terminate the telemedicine visit at any time. I understand that I have the right to inspect all information obtained and/or recorded in the course of the telemedicine visit and may receive copies of available information for a reasonable fee.  I understand that some of thepotential risksof receiving the Services via telemedicine include:  Delay or interruption in medical evaluation due to technological equipment failure or disruption;  Information transmitted may not be sufficient (e.g. poor resolution of images) to allow for appropriate medical decision making by the Practitioner; and/or   In rare instances, security protocols could fail, causing a breach of personal health information.  Furthermore, I acknowledge that it is my responsibility to provide information about my medical history, conditions and care that is complete and accurate to the best of my ability. I acknowledge that Practitioner's advice, recommendations, and/or decision may be based on factors not within their control, such as incomplete or inaccurate data provided by me or distortions of diagnostic images or specimens that may result from electronic transmissions. I understand that the practice of medicine is not an exact science and that Practitioner makes no warranties or guarantees regarding treatment outcomes. I acknowledge that I will receive a copy of this consent concurrently upon execution via email to the email address I last provided but may also request a printed copy by calling the office of [place].    I understand that my insurance will be billed for this visit.   I have read or had this consent read to me.  I understand the contents of this consent, which adequately explains the benefits and risks of the Services being provided via telemedicine.   I have been provided ample opportunity to ask questions regarding this consent and the  Services and have had my questions answered to my satisfaction.  I give my informed consent for the services to be provided through the use of telemedicine in my medical care  By participating in this telemedicine visit I agree to the above.

## 2019-01-22 ENCOUNTER — Telehealth: Payer: Self-pay

## 2019-01-22 ENCOUNTER — Other Ambulatory Visit: Payer: Self-pay

## 2019-01-22 ENCOUNTER — Telehealth (INDEPENDENT_AMBULATORY_CARE_PROVIDER_SITE_OTHER): Payer: BLUE CROSS/BLUE SHIELD | Admitting: Physician Assistant

## 2019-01-22 DIAGNOSIS — M545 Low back pain, unspecified: Secondary | ICD-10-CM

## 2019-01-22 DIAGNOSIS — M25552 Pain in left hip: Secondary | ICD-10-CM

## 2019-01-22 MED ORDER — CYCLOBENZAPRINE HCL 10 MG PO TABS: 10.0000 mg | ORAL_TABLET | Freq: Three times a day (TID) | ORAL | 0 refills | Status: DC | PRN

## 2019-01-22 MED ORDER — MELOXICAM 15 MG PO TABS: 15.0000 mg | ORAL_TABLET | Freq: Every day | ORAL | 0 refills | Status: DC

## 2019-01-22 NOTE — Telephone Encounter (Signed)
I called and left patient a message offering virtual visit for tomorrow or Thursday with Dr. [name].

## 2019-01-22 NOTE — Progress Notes (Signed)
Telephone visit  Subjective: CC: Recheck on low back pain.  PCP: Terald Sleeper, PA-C FUX:NATFT [name] [name] is a 58 y.o. female calls for telephone consult today. Patient provides verbal consent for consult held via phone.  Patient is identified with 2 separate identifiers.  At this time the entire area is on COVID-19 social distance seen and stay home orders.  Patient is of higher risk and therefore we are performing this by a virtual method.  Location of provider: Home Location of patient: Room  Others present for call: no  She was seen a few weeks ago by Dr. Warrick Parisian for lumbar pain with left sciatica symptoms.  She did take an injection of methylprednisolone.  She states it did not make much of a difference.  She has started seeing her chiropractor.  She states that the sciatica has improved but she still is experiencing a lot of lumbar and left hip pain.  It will bother her in the fold of the left hip.  It will make her have more pain when she twists and turns and when she walks.  She has not had any injury to this area.    ROS: Per HPI  Allergies  Allergen Reactions  . Avelox [Moxifloxacin Hcl In Nacl] Shortness Of Breath    Shortness of breath  . Clindamycin/Lincomycin Shortness Of Breath and Diarrhea    Breathing/diarrhea    . Kiwi Extract Anaphylaxis    Makes pt. Feel like her throat is closing   . Bactrim [Sulfamethoxazole-Trimethoprim] Swelling  . Levofloxacin Other (See Comments)    JOINT PAIN JOINT PAIN  . Moxifloxacin Other (See Comments)    Breathing problems Breathing problems  . Spironolactone Other (See Comments)    burning  . Sulfa Antibiotics Swelling   Past Medical History:  Diagnosis Date  . AICD (automatic cardioverter/defibrillator) present 09   [employer]- replaced 2016  . Arthritis   . Bradycardia   . Breast cancer ([place]) 02/24/2016   s/p lumpectomy and radiation but refused chemotherapy  . Chronic systolic CHF (congestive heart failure)  ([place])   . Cough due to angiotensin-converting enzyme inhibitor    But patient chooses to continue ACE  . Dizziness    Dizziness with standing after squatting, March, 2012  . Dyslipidemia    LDL elevation,Patient does not want statin  . Family history of breast cancer   . GERD (gastroesophageal reflux disease)   . H/O shortness of breath    CPX 10/18:  No evidence of cardiopulmonary limitation. Suspect exercise intolerance related to weight and deconditioning.   [name] Hypothyroidism   . ICD (implantable cardiac defibrillator) battery depletion    Dr. [name], June, 2009(artifactual atrial tachycardia response events addressed by reprogramming in parentheses  . Leg swelling    right leg  . Nonischemic cardiomyopathy ([place])    Etiology unknown, diagnosed 2008, patient has never had catheterization or nuclear study as of March, 2011  . Personal history of radiation therapy 2017  . TIA (transient ischemic attack)    No CT or MRI abnormality, aspirin therapy    Current Outpatient Medications:  .  aspirin EC 81 MG tablet, Take 1 tablet (81 mg total) by mouth daily., Disp: 90 tablet, Rfl: 3 .  carvedilol (COREG) 6.25 MG tablet, Take 1 tablet (6.25 mg total) by mouth 2 (two) times daily., Disp: 180 tablet, Rfl: 3 .  cyclobenzaprine (FLEXERIL) 10 MG tablet, Take 1 tablet (10 mg total) by mouth 3 (three) times daily as needed for muscle  spasms., Disp: 60 tablet, Rfl: 0 .  dicyclomine (BENTYL) 10 MG capsule, Take 10 mg by mouth as directed., Disp: , Rfl:  .  ENTRESTO 24-26 MG, TAKE 1 TABLET BY MOUTH TWICE DAILY, Disp: 60 tablet, Rfl: 8 .  famotidine (PEPCID) 40 MG tablet, Take 40 mg by mouth at bedtime., Disp: , Rfl: 5 .  furosemide (LASIX) 20 MG tablet, Take 1 tablet by mouth once daily, Disp: 90 tablet, Rfl: 0 .  ipratropium (ATROVENT) 0.06 % nasal spray, USE 2 DOSES IN EACH NOSTRIL TWICE DAILY AS NEEDED FOR DRAINAGE, Disp: , Rfl: 5 .  levothyroxine (SYNTHROID, LEVOTHROID) 88 MCG tablet, TAKE 1 TABLET  BY MOUTH  DAILY, Disp: 90 tablet, Rfl: 3 .  meloxicam (MOBIC) 15 MG tablet, Take 1 tablet (15 mg total) by mouth daily., Disp: 30 tablet, Rfl: 0 .  omeprazole (PRILOSEC) 40 MG capsule, Take 40 mg by mouth daily., Disp: , Rfl:  .  Probiotic Product (PROBIOTIC PO), Take 2 tablets by mouth daily. , Disp: , Rfl:   Assessment/ Plan: 57 y.o. female   1. Lumbar pain - meloxicam (MOBIC) 15 MG tablet; Take 1 tablet (15 mg total) by mouth daily.  Dispense: 30 tablet; Refill: 0 - cyclobenzaprine (FLEXERIL) 10 MG tablet; Take 1 tablet (10 mg total) by mouth 3 (three) times daily as needed for muscle spasms.  Dispense: 60 tablet; Refill: 0 - DG Lumbar Spine 2-3 Views; Future  2. Hip pain, acute, left - meloxicam (MOBIC) 15 MG tablet; Take 1 tablet (15 mg total) by mouth daily.  Dispense: 30 tablet; Refill: 0 - DG Hip Unilat W OR W/O Pelvis 2-3 Views Left; Future  Start time: 7:59 AM End time: 8:13 AM  Meds ordered this encounter  Medications  . meloxicam (MOBIC) 15 MG tablet    Sig: Take 1 tablet (15 mg total) by mouth daily.    Dispense:  30 tablet    Refill:  0    Order Specific Question:   Supervising Provider    Answer:   Janora Norlander [[id number]]  . cyclobenzaprine (FLEXERIL) 10 MG tablet    Sig: Take 1 tablet (10 mg total) by mouth 3 (three) times daily as needed for muscle spasms.    Dispense:  60 tablet    Refill:  0    Order Specific Question:   Supervising Provider    Answer:   Janora Norlander [[id number]]    Particia Nearing PA-C [place] [phone]

## 2019-01-23 DIAGNOSIS — M62830 Muscle spasm of back: Secondary | ICD-10-CM | POA: Diagnosis not present

## 2019-01-23 DIAGNOSIS — M9903 Segmental and somatic dysfunction of lumbar region: Secondary | ICD-10-CM | POA: Diagnosis not present

## 2019-01-23 DIAGNOSIS — M9901 Segmental and somatic dysfunction of cervical region: Secondary | ICD-10-CM | POA: Diagnosis not present

## 2019-01-23 DIAGNOSIS — M9902 Segmental and somatic dysfunction of thoracic region: Secondary | ICD-10-CM | POA: Diagnosis not present

## 2019-01-24 ENCOUNTER — Other Ambulatory Visit: Payer: Self-pay | Admitting: *Deleted

## 2019-01-24 ENCOUNTER — Encounter: Payer: Self-pay | Admitting: *Deleted

## 2019-01-24 ENCOUNTER — Telehealth (INDEPENDENT_AMBULATORY_CARE_PROVIDER_SITE_OTHER): Payer: BLUE CROSS/BLUE SHIELD | Admitting: Cardiology

## 2019-01-24 ENCOUNTER — Other Ambulatory Visit: Payer: Self-pay

## 2019-01-24 DIAGNOSIS — M9903 Segmental and somatic dysfunction of lumbar region: Secondary | ICD-10-CM | POA: Diagnosis not present

## 2019-01-24 DIAGNOSIS — E782 Mixed hyperlipidemia: Secondary | ICD-10-CM

## 2019-01-24 DIAGNOSIS — I428 Other cardiomyopathies: Secondary | ICD-10-CM | POA: Diagnosis not present

## 2019-01-24 DIAGNOSIS — I5043 Acute on chronic combined systolic (congestive) and diastolic (congestive) heart failure: Secondary | ICD-10-CM

## 2019-01-24 DIAGNOSIS — M62830 Muscle spasm of back: Secondary | ICD-10-CM | POA: Diagnosis not present

## 2019-01-24 DIAGNOSIS — M9901 Segmental and somatic dysfunction of cervical region: Secondary | ICD-10-CM | POA: Diagnosis not present

## 2019-01-24 DIAGNOSIS — M9902 Segmental and somatic dysfunction of thoracic region: Secondary | ICD-10-CM | POA: Diagnosis not present

## 2019-01-24 DIAGNOSIS — Z95810 Presence of automatic (implantable) cardiac defibrillator: Secondary | ICD-10-CM | POA: Diagnosis not present

## 2019-01-24 NOTE — Patient Instructions (Addendum)
Medication Instructions:   Your physician recommends that you continue on your current medications as directed. Please refer to the Current Medication list given to you today.  If you need a refill on your cardiac medications before your next appointment, please call your pharmacy.     Lab work:  PRIOR TO YOUR OFFICE VISIT WITH DR [name] ON 05/17/19--YOU WILL COME HERE TO OUR OFFICE-- WE WILL CHECK A CMET, PRO-BNP, CBC W DIFF, TSH, AND LIPIDS--PLEASE COME FASTING TO THIS LAB APPOINTMENT.  OUR SCHEDULING TEAM WILL BE CALLING YOU SOON TO ARRANGE THIS APPOINTMENT  If you have labs (blood work) drawn today and your tests are completely normal, you will receive your results only by: [name] MyChart Message (if you have MyChart) OR . A paper copy in the mail If you have any lab test that is abnormal or we need to change your treatment, we will call you to review the results.    Testing/Procedures:  Your physician has requested that you have an echocardiogram. Echocardiography is a painless test that uses sound waves to create images of your heart. It provides your doctor with information about the size and shape of your heart and how well your heart's chambers and valves are working. This procedure takes approximately one hour. There are no restrictions for this procedure.  THIS SHOULD BE SCHEDULED PRIOR TO YOUR OFFICE VISIT APPOINTMENT WITH DR [name] ON 05/17/19.  THIS WILL BE SCHEDULED THE SAME DAY AS YOUR LAB APPOINTMENT HERE IN OUR OFFICE.  OUR SCHEDULING DEPARTMENT WILL CALL YOU SHORTLY TO ARRANGE THIS.     Follow-Up:  WITH DR [name] ON 05/17/19- YOU WILL NEED TO HAVE YOUR LAB APPOINTMENT AND ECHO APPOINTMENT SCHEDULED PRIOR TO THIS FOLLOW-UP APPOINMENT--SCHEDULING WILL CALL YOU TO ARRANGE YOUR FOLLOW-UP APPOINTMENT WITH DR [name] ON 7/24 AS WELL AS YOUR ECHO AND LAB APPOINTMENT THAT WILL BE ARRANGED PRIOR TO THAT 05/17/19 APPOINTMENT.

## 2019-01-24 NOTE — Progress Notes (Signed)
Virtual Visit via Telephone Note    Evaluation Performed:  Follow-up visit  This visit type was conducted due to national recommendations for restrictions regarding the COVID-19 Pandemic (e.g. social distancing).  This format is felt to be most appropriate for this patient at this time.  All issues noted in this document were discussed and addressed.  No physical exam was performed (except for noted visual exam findings with Video Visits).  Please refer to the patient's chart (MyChart message for video visits and phone note for telephone visits) for the patient's consent to telehealth for [facility].  Date:  01/24/2019   ID:  [name] [name], DOB 1961/01/14, MRN [id number]  Patient Location:  home  Provider location:   [address]  PCP:  Terald Sleeper, PA-C  Cardiologist: Ena Dawley, MD Electrophysiologist:  None   Chief Complaint:  1 year follow up  History of Present Illness:    [name] [name] is a 58 y.o. female who presents via audio/video conferencing for a telehealth visit today.    The patient does not have symptoms concerning for COVID-19 infection (fever, chills, cough, or new shortness of breath).   58 y.o. female who is being seen today for chronic systolic HF and NICM. She has an ICD for primary prevention, followed by Dr. [name]. Also with h/o hypothyroidism, TIA, breast cancer w/ lumpectomy (declined chemo) and multiple medication intolerances (Spironolactone caused body burning sensation, could not afford Bidil and had fatigue with high does of Entresto).  Echo done 01/2018 showed EF at 30-35%. No significant change from previous.   03/2018 -  pt notes improvement in symptoms since reducing Entresto dose. She notes less fatigue and improved exercise tolerance. Her BP has remained stable and controlled. BP today is 110/84. She denies CP. No dyspnea. She also denies orthopnea and PND but is complaining of increased abdominal distention. She notes full compliance with  Lasix and restricts sodium. She is not compliant with daily weights. Feels as though her pants are fitting tighter. No LEE.   01/24/2019 -today she states that she has been doing well she denies any palpitations, ICD firing, no recent chest pain shortness of breath, no lower extremity edema orthopnea proximal nocturnal dyspnea.  She continues to experience dizziness and slight nausea after she takes Entresto.  No syncope.  Prior CV studies:   The following studies were reviewed today:  TTE: 01/25/2019 - Left ventricle: The cavity size was moderately dilated. Systolic   function was moderately to severely reduced. The estimated   ejection fraction was in the range of 30% to 35%. Diffuse   hypokinesis. Left ventricular diastolic function parameters were   normal. - Mitral valve: Mild prolapse of anterior leaflet and retricted   posterior leaflet There was mild regurgitation. - Impressions: GLS low normal -15.4.  Impressions: - GLS low normal -15.4.  Past Medical History:  Diagnosis Date  . AICD (automatic cardioverter/defibrillator) present 09   [employer]- replaced 2016  . Arthritis   . Bradycardia   . Breast cancer ([place]) 02/24/2016   s/p lumpectomy and radiation but refused chemotherapy  . Chronic systolic CHF (congestive heart failure) ([place])   . Cough due to angiotensin-converting enzyme inhibitor    But patient chooses to continue ACE  . Dizziness    Dizziness with standing after squatting, March, 2012  . Dyslipidemia    LDL elevation,Patient does not want statin  . Family history of breast cancer   . GERD (gastroesophageal reflux disease)   .  H/O shortness of breath    CPX 10/18:  No evidence of cardiopulmonary limitation. Suspect exercise intolerance related to weight and deconditioning.   [name] Hypothyroidism   . ICD (implantable cardiac defibrillator) battery depletion    Dr. [name], June, 2009(artifactual atrial tachycardia response events addressed by reprogramming in  parentheses  . Leg swelling    right leg  . Nonischemic cardiomyopathy ([place])    Etiology unknown, diagnosed 2008, patient has never had catheterization or nuclear study as of March, 2011  . Personal history of radiation therapy 2017  . TIA (transient ischemic attack)    No CT or MRI abnormality, aspirin therapy   Past Surgical History:  Procedure Laterality Date  . BREAST BIOPSY Left 02/24/2016   U/S Core  . BREAST BIOPSY Left 02/24/2016   U/S Core  . BREAST LUMPECTOMY Left 04/05/2016  . BREAST LUMPECTOMY WITH RADIOACTIVE SEED AND SENTINEL LYMPH NODE BIOPSY Left 04/05/2016   Procedure: BREAST LUMPECTOMY WITH RADIOACTIVE SEED AND SENTINEL LYMPH NODE BIOPSY;  Surgeon: [name], MD;  Location: [facility];  Service: General;  Laterality: Left;  . CARDIAC CATHETERIZATION N/A 08/05/2016   Procedure: Left Heart Cath and Coronary Angiography;  Surgeon: Peter M Martinique, MD;  Location: [facility];  Service: Cardiovascular;  Laterality: N/A;  . CARDIAC DEFIBRILLATOR PLACEMENT  09   [employer] no remote  . COLONOSCOPY    . CYST EXCISION Left    back of leg-lt   . IMPLANTABLE CARDIOVERTER DEFIBRILLATOR GENERATOR CHANGE N/A 12/29/2014   Procedure: IMPLANTABLE CARDIOVERTER DEFIBRILLATOR GENERATOR CHANGE;  Surgeon: [name], MD;  Location: [facility] CATH LAB;  Service: Cardiovascular;  Laterality: N/A;  . LATERAL EPICONDYLE RELEASE  10/11/2012   Procedure: TENNIS ELBOW RELEASE;  Surgeon: [name], MD;  Location: [facility];  Service: Orthopedics;  Laterality: Right;  RIGHT ELBOW: TENOTOMY ELBOW LATERAL EPICONDYLITIS TENNIS ELBOW: RADIAL TUNNEL RELEASE  . PORT-A-CATH REMOVAL  04/13/2016   Procedure: MINOR REMOVAL PORT-A-CATH;  Surgeon: [name], MD;  Location: [facility];  Service: General;;  . PORTACATH PLACEMENT Right 04/05/2016   Procedure: INSERTION PORT-A-CATH WITH [place];  Surgeon: [name], MD;  Location: [facility];  Service: General;  Laterality:  Right;  right IJ  . TOTAL THYROIDECTOMY  2001  . TUBAL LIGATION  94  . UPPER GI ENDOSCOPY      Current Meds  Medication Sig  . amitriptyline (ELAVIL) 10 MG tablet Take 10 mg by mouth at bedtime.  [name] aspirin EC 81 MG tablet Take 1 tablet (81 mg total) by mouth daily.  . carvedilol (COREG) 6.25 MG tablet Take 1 tablet (6.25 mg total) by mouth 2 (two) times daily.  . cyclobenzaprine (FLEXERIL) 10 MG tablet Take 1 tablet (10 mg total) by mouth 3 (three) times daily as needed for muscle spasms.  [name] dicyclomine (BENTYL) 10 MG capsule Take 10 mg by mouth as directed.  [name] ENTRESTO 24-26 MG TAKE 1 TABLET BY MOUTH TWICE DAILY  . furosemide (LASIX) 20 MG tablet Take 1 tablet by mouth once daily  . ipratropium (ATROVENT) 0.06 % nasal spray USE 2 DOSES IN EACH NOSTRIL TWICE DAILY AS NEEDED FOR DRAINAGE  . levothyroxine (SYNTHROID, LEVOTHROID) 88 MCG tablet TAKE 1 TABLET BY MOUTH  DAILY  . meloxicam (MOBIC) 15 MG tablet Take 1 tablet (15 mg total) by mouth daily.  [name] omeprazole (PRILOSEC) 40 MG capsule Take 40 mg by mouth daily.  . [DISCONTINUED] famotidine (PEPCID) 40 MG tablet Take 40 mg  by mouth at bedtime.     Allergies:   Avelox [moxifloxacin hcl in nacl]; Clindamycin/lincomycin; Kiwi extract; Bactrim [sulfamethoxazole-trimethoprim]; Levofloxacin; Moxifloxacin; Spironolactone; and Sulfa antibiotics   Social History   Tobacco Use  . Smoking status: Never Smoker  . Smokeless tobacco: Never Used  Substance Use Topics  . Alcohol use: No  . Drug use: No     Family Hx: The patient's family history includes Breast cancer in her maternal aunt; Breast cancer (age of onset: 60) in her mother; Cancer in her maternal aunt, mother, and paternal grandmother; Heart attack in her mother; Hypertension in her brother and sister. There is no history of Stroke or Diabetes.  ROS:   Please see the history of present illness.    All other systems reviewed and are negative.  Labs/Other Tests and Data Reviewed:     Recent Labs: 04/04/2018: NT-Pro BNP 209 05/04/2018: ALT 8; BUN 12; Creatinine, Ser 0.89; Hemoglobin 11.8; Platelets 209; Potassium 4.5; Sodium 145 08/20/2018: TSH 2.270   Recent Lipid Panel Lab Results  Component Value Date/Time   CHOL 209 (H) 05/04/2018 11:55 AM   TRIG 69 05/04/2018 11:55 AM   HDL 66 05/04/2018 11:55 AM   CHOLHDL 3.2 05/04/2018 11:55 AM   LDLCALC 129 (H) 05/04/2018 11:55 AM   Wt Readings from Last 3 Encounters:  01/24/19 205 lb (93 kg)  12/21/18 216 lb 9.6 oz (98.2 kg)  09/04/18 217 lb (98.4 kg)    Objective:    Vital Signs:  Ht 5\' 8"  (1.727 m)   Wt 205 lb (93 kg)   BMI 31.17 kg/m     ASSESSMENT & PLAN:    1.  NICM/Chronic Systolic HF: EF stable at 30-35%, by recent echo.  She appears euvolemic, no ICD firing, still mild intolerance to [facility], will continue current dose, we will see her in person in 4 months and repeat an echo and labs prior to that appointment including CBC, BNP, CMP, TSH, lipids.    2.  Hyperlipidemia -she has mildly elevated LDL however she is now on a diet and started to exercise we will repeat prior to the next visit.   3.  Status post ICD placement -no ICD firing till date, no infection.  COVID-19 Education: The signs and symptoms of COVID-19 were discussed with the patient and how to seek care for testing (follow up with PCP or arrange E-visit).  The importance of social distancing was discussed today.  Patient Risk:   After full review of this patient's clinical status, I feel that they are at least moderate risk at this time.  Time:   Today, I have spent 25 minutes with the patient with telehealth technology discussing CHF, COVID-19.     Medication Adjustments/Labs and Tests Ordered: Current medicines are reviewed at length with the patient today.  Concerns regarding medicines are outlined above.  Tests Ordered: No orders of the defined types were placed in this encounter.  Medication Changes: No orders of the defined  types were placed in this encounter.   Disposition:  Follow up in 4 month(s)  Signed, Ena Dawley, MD  01/24/2019 10:47 AM    [place]

## 2019-01-24 NOTE — Addendum Note (Signed)
Addended by: [name] on: 01/24/2019 11:34 AM   Modules accepted: Orders

## 2019-01-25 ENCOUNTER — Telehealth: Payer: Self-pay | Admitting: *Deleted

## 2019-01-25 NOTE — Telephone Encounter (Signed)
-----   Message from Holley Dexter sent at 01/25/2019  2:41 PM EDT ----- Regarding: RE: APPTS NEEDED FROM TODAYS VIRTUAL VISIT WITH DR Sudie Grumbling, I have left Patient a detailed voice mail with appts dates time. I also mailed the patient an AVS and July calendar with appt dates and time. Thanks, renee ----- Message ----- From: [name], LPN Sent: 10/29/8307  40:76 AM EDT To: [facility] Subject: APPTS NEEDED FROM TODAYS VIRTUAL VISIT WITH #  DR [name] DID A VIRTUAL VISIT ON THIS PT TODAY.  SHE WILL NEED TO BE SCHEDULED TO SEE DR NELSON ON 05/17/19.  PLEASE CALL AND SCHEDULE HER.   SHE WILL NEED AN ECHO AND LAB APPOINTMENT SAME DAY, BUT ARRANGED PRIOR TO HER 7/24 OFFICE VISIT APPOINTMENT WITH DR [name].  PLEASE CALL AND SCHEDULE.   PT AWARE YOU WILL CALL AND SCHEDULE THESE APPTS TODAY. PLEASE ARRANGE?  THANKS, IVY

## 2019-01-28 ENCOUNTER — Telehealth: Payer: Self-pay | Admitting: *Deleted

## 2019-01-28 DIAGNOSIS — M62830 Muscle spasm of back: Secondary | ICD-10-CM | POA: Diagnosis not present

## 2019-01-28 DIAGNOSIS — M9902 Segmental and somatic dysfunction of thoracic region: Secondary | ICD-10-CM | POA: Diagnosis not present

## 2019-01-28 DIAGNOSIS — R1012 Left upper quadrant pain: Secondary | ICD-10-CM | POA: Diagnosis not present

## 2019-01-28 DIAGNOSIS — M9903 Segmental and somatic dysfunction of lumbar region: Secondary | ICD-10-CM | POA: Diagnosis not present

## 2019-01-28 DIAGNOSIS — M9901 Segmental and somatic dysfunction of cervical region: Secondary | ICD-10-CM | POA: Diagnosis not present

## 2019-01-28 NOTE — Telephone Encounter (Signed)
Pts echo with lab same day is now scheduled for 7/27.  Pts follow-up appt with Dr [name] is now  scheduled for 8/24, for the pt states she could not make it to visit scheduled for 7/24 with Dr [name], due to work obligations.  Pt made aware of new appt dates and times by [facility] scheduling.

## 2019-01-28 NOTE — Telephone Encounter (Signed)
-----   Message from Holley Dexter sent at 01/25/2019  2:41 PM EDT ----- Regarding: RE: APPTS NEEDED FROM TODAYS VIRTUAL VISIT WITH DR Sudie Grumbling, I have left Patient a detailed voice mail with appts dates time. I also mailed the patient an AVS and July calendar with appt dates and time. Thanks, renee ----- Message ----- From: [name], LPN Sent: 04/26/813  48:18 AM EDT To: [facility] Subject: APPTS NEEDED FROM TODAYS VIRTUAL VISIT WITH #  DR [name] DID A VIRTUAL VISIT ON THIS PT TODAY.  SHE WILL NEED TO BE SCHEDULED TO SEE DR NELSON ON 05/17/19.  PLEASE CALL AND SCHEDULE HER.   SHE WILL NEED AN ECHO AND LAB APPOINTMENT SAME DAY, BUT ARRANGED PRIOR TO HER 7/24 OFFICE VISIT APPOINTMENT WITH DR [name].  PLEASE CALL AND SCHEDULE.   PT AWARE YOU WILL CALL AND SCHEDULE THESE APPTS TODAY. PLEASE ARRANGE?  THANKS, Myrtha Tonkovich

## 2019-01-29 DIAGNOSIS — M9901 Segmental and somatic dysfunction of cervical region: Secondary | ICD-10-CM | POA: Diagnosis not present

## 2019-01-29 DIAGNOSIS — M9902 Segmental and somatic dysfunction of thoracic region: Secondary | ICD-10-CM | POA: Diagnosis not present

## 2019-01-29 DIAGNOSIS — M62830 Muscle spasm of back: Secondary | ICD-10-CM | POA: Diagnosis not present

## 2019-01-29 DIAGNOSIS — M9903 Segmental and somatic dysfunction of lumbar region: Secondary | ICD-10-CM | POA: Diagnosis not present

## 2019-02-06 DIAGNOSIS — M9902 Segmental and somatic dysfunction of thoracic region: Secondary | ICD-10-CM | POA: Diagnosis not present

## 2019-02-06 DIAGNOSIS — M9903 Segmental and somatic dysfunction of lumbar region: Secondary | ICD-10-CM | POA: Diagnosis not present

## 2019-02-06 DIAGNOSIS — M62830 Muscle spasm of back: Secondary | ICD-10-CM | POA: Diagnosis not present

## 2019-02-06 DIAGNOSIS — M9901 Segmental and somatic dysfunction of cervical region: Secondary | ICD-10-CM | POA: Diagnosis not present

## 2019-02-07 ENCOUNTER — Other Ambulatory Visit: Payer: Self-pay | Admitting: Adult Health

## 2019-02-07 DIAGNOSIS — Z853 Personal history of malignant neoplasm of breast: Secondary | ICD-10-CM

## 2019-02-07 DIAGNOSIS — M9903 Segmental and somatic dysfunction of lumbar region: Secondary | ICD-10-CM | POA: Diagnosis not present

## 2019-02-07 DIAGNOSIS — M9901 Segmental and somatic dysfunction of cervical region: Secondary | ICD-10-CM | POA: Diagnosis not present

## 2019-02-07 DIAGNOSIS — M62830 Muscle spasm of back: Secondary | ICD-10-CM | POA: Diagnosis not present

## 2019-02-07 DIAGNOSIS — M9902 Segmental and somatic dysfunction of thoracic region: Secondary | ICD-10-CM | POA: Diagnosis not present

## 2019-02-11 ENCOUNTER — Other Ambulatory Visit: Payer: BLUE CROSS/BLUE SHIELD

## 2019-02-11 DIAGNOSIS — Z6832 Body mass index (BMI) 32.0-32.9, adult: Secondary | ICD-10-CM | POA: Diagnosis not present

## 2019-02-11 DIAGNOSIS — M549 Dorsalgia, unspecified: Secondary | ICD-10-CM | POA: Diagnosis not present

## 2019-02-12 DIAGNOSIS — M62830 Muscle spasm of back: Secondary | ICD-10-CM | POA: Diagnosis not present

## 2019-02-12 DIAGNOSIS — M9901 Segmental and somatic dysfunction of cervical region: Secondary | ICD-10-CM | POA: Diagnosis not present

## 2019-02-12 DIAGNOSIS — M9902 Segmental and somatic dysfunction of thoracic region: Secondary | ICD-10-CM | POA: Diagnosis not present

## 2019-02-12 DIAGNOSIS — M9903 Segmental and somatic dysfunction of lumbar region: Secondary | ICD-10-CM | POA: Diagnosis not present

## 2019-02-13 ENCOUNTER — Ambulatory Visit (INDEPENDENT_AMBULATORY_CARE_PROVIDER_SITE_OTHER): Payer: BLUE CROSS/BLUE SHIELD | Admitting: *Deleted

## 2019-02-13 ENCOUNTER — Other Ambulatory Visit: Payer: Self-pay

## 2019-02-13 DIAGNOSIS — I428 Other cardiomyopathies: Secondary | ICD-10-CM | POA: Diagnosis not present

## 2019-02-13 LAB — CUP PACEART REMOTE DEVICE CHECK
Battery Remaining Longevity: 96 mo
Battery Remaining Percentage: 100 %
Brady Statistic RA Percent Paced: 0 %
Brady Statistic RV Percent Paced: 0 %
Date Time Interrogation Session: 20200422101111
HighPow Impedance: 51 Ohm
Implantable Lead Implant Date: 20090320
Implantable Lead Implant Date: 20090320
Implantable Lead Location: 753859
Implantable Lead Location: 753860
Implantable Lead Model: 158
Implantable Lead Model: 5076
Implantable Lead Serial Number: 182504
Implantable Pulse Generator Implant Date: 20160307
Lead Channel Impedance Value: 486 Ohm
Lead Channel Impedance Value: 524 Ohm
Lead Channel Pacing Threshold Amplitude: 0.4 V
Lead Channel Pacing Threshold Amplitude: 0.9 V
Lead Channel Pacing Threshold Pulse Width: 0.4 ms
Lead Channel Pacing Threshold Pulse Width: 0.4 ms
Lead Channel Setting Pacing Amplitude: 2 V
Lead Channel Setting Pacing Amplitude: 2.5 V
Lead Channel Setting Pacing Pulse Width: 0.4 ms
Lead Channel Setting Sensing Sensitivity: 0.6 mV
Pulse Gen Serial Number: 111826

## 2019-02-20 DIAGNOSIS — M9902 Segmental and somatic dysfunction of thoracic region: Secondary | ICD-10-CM | POA: Diagnosis not present

## 2019-02-20 DIAGNOSIS — M62830 Muscle spasm of back: Secondary | ICD-10-CM | POA: Diagnosis not present

## 2019-02-20 DIAGNOSIS — M9901 Segmental and somatic dysfunction of cervical region: Secondary | ICD-10-CM | POA: Diagnosis not present

## 2019-02-20 DIAGNOSIS — M9903 Segmental and somatic dysfunction of lumbar region: Secondary | ICD-10-CM | POA: Diagnosis not present

## 2019-02-21 ENCOUNTER — Encounter: Payer: Self-pay | Admitting: Cardiology

## 2019-02-21 NOTE — Progress Notes (Signed)
Remote ICD transmission.   

## 2019-02-25 DIAGNOSIS — M9901 Segmental and somatic dysfunction of cervical region: Secondary | ICD-10-CM | POA: Diagnosis not present

## 2019-02-25 DIAGNOSIS — M62830 Muscle spasm of back: Secondary | ICD-10-CM | POA: Diagnosis not present

## 2019-02-25 DIAGNOSIS — M9902 Segmental and somatic dysfunction of thoracic region: Secondary | ICD-10-CM | POA: Diagnosis not present

## 2019-02-25 DIAGNOSIS — M9903 Segmental and somatic dysfunction of lumbar region: Secondary | ICD-10-CM | POA: Diagnosis not present

## 2019-03-06 DIAGNOSIS — M9901 Segmental and somatic dysfunction of cervical region: Secondary | ICD-10-CM | POA: Diagnosis not present

## 2019-03-06 DIAGNOSIS — M62830 Muscle spasm of back: Secondary | ICD-10-CM | POA: Diagnosis not present

## 2019-03-06 DIAGNOSIS — M9903 Segmental and somatic dysfunction of lumbar region: Secondary | ICD-10-CM | POA: Diagnosis not present

## 2019-03-06 DIAGNOSIS — M9902 Segmental and somatic dysfunction of thoracic region: Secondary | ICD-10-CM | POA: Diagnosis not present

## 2019-03-07 DIAGNOSIS — M9901 Segmental and somatic dysfunction of cervical region: Secondary | ICD-10-CM | POA: Diagnosis not present

## 2019-03-07 DIAGNOSIS — M62830 Muscle spasm of back: Secondary | ICD-10-CM | POA: Diagnosis not present

## 2019-03-07 DIAGNOSIS — M9902 Segmental and somatic dysfunction of thoracic region: Secondary | ICD-10-CM | POA: Diagnosis not present

## 2019-03-07 DIAGNOSIS — M9903 Segmental and somatic dysfunction of lumbar region: Secondary | ICD-10-CM | POA: Diagnosis not present

## 2019-03-16 DIAGNOSIS — Z6832 Body mass index (BMI) 32.0-32.9, adult: Secondary | ICD-10-CM | POA: Diagnosis not present

## 2019-03-16 DIAGNOSIS — M79641 Pain in right hand: Secondary | ICD-10-CM | POA: Diagnosis not present

## 2019-03-26 ENCOUNTER — Other Ambulatory Visit: Payer: Self-pay

## 2019-03-26 ENCOUNTER — Ambulatory Visit
Admission: RE | Admit: 2019-03-26 | Discharge: 2019-03-26 | Disposition: A | Discharge status: Home / Self Care | Payer: BLUE CROSS/BLUE SHIELD | Source: Ambulatory Visit | Attending: Adult Health | Admitting: Adult Health

## 2019-03-26 DIAGNOSIS — R928 Other abnormal and inconclusive findings on diagnostic imaging of breast: Secondary | ICD-10-CM | POA: Diagnosis not present

## 2019-03-26 DIAGNOSIS — Z853 Personal history of malignant neoplasm of breast: Secondary | ICD-10-CM | POA: Diagnosis not present

## 2019-04-05 ENCOUNTER — Other Ambulatory Visit: Payer: Self-pay | Admitting: Cardiology

## 2019-04-05 DIAGNOSIS — I5022 Chronic systolic (congestive) heart failure: Secondary | ICD-10-CM

## 2019-04-06 ENCOUNTER — Other Ambulatory Visit: Payer: Self-pay | Admitting: Physician Assistant

## 2019-04-18 DIAGNOSIS — M4716 Other spondylosis with myelopathy, lumbar region: Secondary | ICD-10-CM | POA: Diagnosis not present

## 2019-04-18 DIAGNOSIS — M545 Low back pain: Secondary | ICD-10-CM | POA: Diagnosis not present

## 2019-04-19 ENCOUNTER — Other Ambulatory Visit: Payer: Self-pay | Admitting: Physician Assistant

## 2019-04-19 DIAGNOSIS — M4716 Other spondylosis with myelopathy, lumbar region: Secondary | ICD-10-CM | POA: Insufficient documentation

## 2019-04-22 NOTE — Telephone Encounter (Signed)
lmtcb and schedule appointment.

## 2019-04-22 NOTE — Telephone Encounter (Signed)
Jones. Needs to have labwork drawn that has been ordered. Last labs 2019

## 2019-04-24 ENCOUNTER — Other Ambulatory Visit: Payer: Self-pay | Admitting: Cardiology

## 2019-05-02 DIAGNOSIS — I10 Essential (primary) hypertension: Secondary | ICD-10-CM | POA: Diagnosis not present

## 2019-05-02 DIAGNOSIS — I2510 Atherosclerotic heart disease of native coronary artery without angina pectoris: Secondary | ICD-10-CM | POA: Diagnosis not present

## 2019-05-02 DIAGNOSIS — Z01812 Encounter for preprocedural laboratory examination: Secondary | ICD-10-CM | POA: Diagnosis not present

## 2019-05-02 DIAGNOSIS — Z950 Presence of cardiac pacemaker: Secondary | ICD-10-CM | POA: Diagnosis not present

## 2019-05-02 DIAGNOSIS — E039 Hypothyroidism, unspecified: Secondary | ICD-10-CM | POA: Diagnosis not present

## 2019-05-02 DIAGNOSIS — Z1159 Encounter for screening for other viral diseases: Secondary | ICD-10-CM | POA: Diagnosis not present

## 2019-05-05 ENCOUNTER — Other Ambulatory Visit: Payer: Self-pay | Admitting: Physician Assistant

## 2019-05-07 DIAGNOSIS — Z881 Allergy status to other antibiotic agents status: Secondary | ICD-10-CM | POA: Diagnosis not present

## 2019-05-07 DIAGNOSIS — Z888 Allergy status to other drugs, medicaments and biological substances status: Secondary | ICD-10-CM | POA: Diagnosis not present

## 2019-05-07 DIAGNOSIS — I10 Essential (primary) hypertension: Secondary | ICD-10-CM | POA: Diagnosis not present

## 2019-05-07 DIAGNOSIS — E669 Obesity, unspecified: Secondary | ICD-10-CM | POA: Diagnosis not present

## 2019-05-07 DIAGNOSIS — Z860100 Personal history of colon polyps, unspecified: Secondary | ICD-10-CM | POA: Insufficient documentation

## 2019-05-07 DIAGNOSIS — Z882 Allergy status to sulfonamides status: Secondary | ICD-10-CM | POA: Diagnosis not present

## 2019-05-07 DIAGNOSIS — D122 Benign neoplasm of ascending colon: Secondary | ICD-10-CM | POA: Diagnosis not present

## 2019-05-07 DIAGNOSIS — Z79899 Other long term (current) drug therapy: Secondary | ICD-10-CM | POA: Diagnosis not present

## 2019-05-07 DIAGNOSIS — K219 Gastro-esophageal reflux disease without esophagitis: Secondary | ICD-10-CM | POA: Diagnosis not present

## 2019-05-07 DIAGNOSIS — K648 Other hemorrhoids: Secondary | ICD-10-CM | POA: Diagnosis not present

## 2019-05-07 DIAGNOSIS — Z803 Family history of malignant neoplasm of breast: Secondary | ICD-10-CM | POA: Diagnosis not present

## 2019-05-07 DIAGNOSIS — Z91018 Allergy to other foods: Secondary | ICD-10-CM | POA: Diagnosis not present

## 2019-05-07 DIAGNOSIS — Z1211 Encounter for screening for malignant neoplasm of colon: Secondary | ICD-10-CM | POA: Diagnosis not present

## 2019-05-07 DIAGNOSIS — Z7982 Long term (current) use of aspirin: Secondary | ICD-10-CM | POA: Diagnosis not present

## 2019-05-07 DIAGNOSIS — Z853 Personal history of malignant neoplasm of breast: Secondary | ICD-10-CM | POA: Diagnosis not present

## 2019-05-13 ENCOUNTER — Other Ambulatory Visit: Payer: BLUE CROSS/BLUE SHIELD

## 2019-05-13 ENCOUNTER — Other Ambulatory Visit (HOSPITAL_COMMUNITY): Payer: BLUE CROSS/BLUE SHIELD

## 2019-05-15 ENCOUNTER — Ambulatory Visit (INDEPENDENT_AMBULATORY_CARE_PROVIDER_SITE_OTHER): Payer: BC Managed Care – PPO | Admitting: *Deleted

## 2019-05-15 ENCOUNTER — Ambulatory Visit (HOSPITAL_COMMUNITY): Payer: BC Managed Care – PPO | Attending: Cardiology

## 2019-05-15 ENCOUNTER — Other Ambulatory Visit: Payer: BC Managed Care – PPO | Admitting: *Deleted

## 2019-05-15 ENCOUNTER — Other Ambulatory Visit: Payer: Self-pay

## 2019-05-15 DIAGNOSIS — I5022 Chronic systolic (congestive) heart failure: Secondary | ICD-10-CM

## 2019-05-15 DIAGNOSIS — R0609 Other forms of dyspnea: Secondary | ICD-10-CM | POA: Diagnosis not present

## 2019-05-15 DIAGNOSIS — I428 Other cardiomyopathies: Secondary | ICD-10-CM

## 2019-05-15 DIAGNOSIS — E782 Mixed hyperlipidemia: Secondary | ICD-10-CM | POA: Diagnosis not present

## 2019-05-15 DIAGNOSIS — E039 Hypothyroidism, unspecified: Secondary | ICD-10-CM | POA: Diagnosis not present

## 2019-05-15 DIAGNOSIS — I5043 Acute on chronic combined systolic (congestive) and diastolic (congestive) heart failure: Secondary | ICD-10-CM

## 2019-05-15 DIAGNOSIS — R9430 Abnormal result of cardiovascular function study, unspecified: Secondary | ICD-10-CM | POA: Diagnosis not present

## 2019-05-15 DIAGNOSIS — Z95810 Presence of automatic (implantable) cardiac defibrillator: Secondary | ICD-10-CM

## 2019-05-15 LAB — COMPREHENSIVE METABOLIC PANEL
ALT: 20 IU/L (ref 0–32)
AST: 19 IU/L (ref 0–40)
Albumin/Globulin Ratio: 2.3 — ABNORMAL HIGH (ref 1.2–2.2)
Albumin: 4.1 g/dL (ref 3.8–4.9)
Alkaline Phosphatase: 51 IU/L (ref 39–117)
BUN/Creatinine Ratio: 20 (ref 9–23)
BUN: 17 mg/dL (ref 6–24)
Bilirubin Total: <0.2 mg/dL (ref 0.0–1.2)
CO2: 23 mmol/L (ref 20–29)
Calcium: 8.7 mg/dL (ref 8.7–10.2)
Chloride: 109 mmol/L — ABNORMAL HIGH (ref 96–106)
Creatinine, Ser: 0.87 mg/dL (ref 0.57–1.00)
GFR calc Af Amer: 84 mL/min/{1.73_m2} (ref >59)
GFR calc non Af Amer: 73 mL/min/{1.73_m2} (ref >59)
Globulin, Total: 1.8 g/dL (ref 1.5–4.5)
Glucose: 82 mg/dL (ref 65–99)
Potassium: 4.2 mmol/L (ref 3.5–5.2)
Sodium: 145 mmol/L — ABNORMAL HIGH (ref 134–144)
Total Protein: 5.9 g/dL — ABNORMAL LOW (ref 6.0–8.5)

## 2019-05-15 LAB — CUP PACEART REMOTE DEVICE CHECK
Battery Remaining Longevity: 96 mo
Battery Remaining Percentage: 100 %
Brady Statistic RA Percent Paced: 0 %
Brady Statistic RV Percent Paced: 0 %
Date Time Interrogation Session: 20200722041400
HighPow Impedance: 52 Ohm
Implantable Lead Implant Date: 20090320
Implantable Lead Implant Date: 20090320
Implantable Lead Location: 753859
Implantable Lead Location: 753860
Implantable Lead Model: 158
Implantable Lead Model: 5076
Implantable Lead Serial Number: 182504
Implantable Pulse Generator Implant Date: 20160307
Lead Channel Impedance Value: 514 Ohm
Lead Channel Impedance Value: 515 Ohm
Lead Channel Pacing Threshold Amplitude: 0.4 V
Lead Channel Pacing Threshold Amplitude: 0.9 V
Lead Channel Pacing Threshold Pulse Width: 0.4 ms
Lead Channel Pacing Threshold Pulse Width: 0.4 ms
Lead Channel Setting Pacing Amplitude: 2 V
Lead Channel Setting Pacing Amplitude: 2.5 V
Lead Channel Setting Pacing Pulse Width: 0.4 ms
Lead Channel Setting Sensing Sensitivity: 0.6 mV
Pulse Gen Serial Number: 111826

## 2019-05-15 LAB — CBC WITH DIFFERENTIAL/PLATELET
Basophils Absolute: 0.1 10*3/uL (ref 0.0–0.2)
Basos: 1 %
EOS (ABSOLUTE): 0.5 10*3/uL — ABNORMAL HIGH (ref 0.0–0.4)
Eos: 9 %
Hematocrit: 33.4 % — ABNORMAL LOW (ref 34.0–46.6)
Hemoglobin: 10.9 g/dL — ABNORMAL LOW (ref 11.1–15.9)
Immature Grans (Abs): 0 10*3/uL (ref 0.0–0.1)
Immature Granulocytes: 0 %
Lymphocytes Absolute: 1.6 10*3/uL (ref 0.7–3.1)
Lymphs: 32 %
MCH: 30.5 pg (ref 26.6–33.0)
MCHC: 32.6 g/dL (ref 31.5–35.7)
MCV: 94 fL (ref 79–97)
Monocytes Absolute: 0.4 10*3/uL (ref 0.1–0.9)
Monocytes: 8 %
Neutrophils Absolute: 2.5 10*3/uL (ref 1.4–7.0)
Neutrophils: 50 %
Platelets: 208 10*3/uL (ref 150–450)
RBC: 3.57 x10E6/uL — ABNORMAL LOW (ref 3.77–5.28)
RDW: 13.1 % (ref 11.7–15.4)
WBC: 5.1 10*3/uL (ref 3.4–10.8)

## 2019-05-15 LAB — PRO B NATRIURETIC PEPTIDE: NT-Pro BNP: 154 pg/mL (ref 0–287)

## 2019-05-15 LAB — TSH: TSH: 1.02 u[IU]/mL (ref 0.450–4.500)

## 2019-05-15 LAB — LIPID PANEL
Chol/HDL Ratio: 3.2 ratio (ref 0.0–4.4)
Cholesterol, Total: 207 mg/dL — ABNORMAL HIGH (ref 100–199)
HDL: 64 mg/dL (ref >39)
LDL Calculated: 127 mg/dL — ABNORMAL HIGH (ref 0–99)
Triglycerides: 81 mg/dL (ref 0–149)
VLDL Cholesterol Cal: 16 mg/dL (ref 5–40)

## 2019-05-16 DIAGNOSIS — M545 Low back pain: Secondary | ICD-10-CM | POA: Diagnosis not present

## 2019-05-16 NOTE — Progress Notes (Signed)
Her LVEF remains low at 30%, if her BP is above 120 mmHg, I would start her on spironolactone 12.5 mg po daily.

## 2019-05-17 ENCOUNTER — Ambulatory Visit: Payer: BLUE CROSS/BLUE SHIELD | Admitting: Cardiology

## 2019-05-20 ENCOUNTER — Other Ambulatory Visit (HOSPITAL_COMMUNITY): Payer: BLUE CROSS/BLUE SHIELD

## 2019-05-20 ENCOUNTER — Other Ambulatory Visit: Payer: BLUE CROSS/BLUE SHIELD

## 2019-05-29 NOTE — Progress Notes (Signed)
Remote ICD transmission.   

## 2019-06-03 DIAGNOSIS — M47816 Spondylosis without myelopathy or radiculopathy, lumbar region: Secondary | ICD-10-CM | POA: Diagnosis not present

## 2019-06-03 DIAGNOSIS — M47817 Spondylosis without myelopathy or radiculopathy, lumbosacral region: Secondary | ICD-10-CM | POA: Diagnosis not present

## 2019-06-03 DIAGNOSIS — M4316 Spondylolisthesis, lumbar region: Secondary | ICD-10-CM | POA: Diagnosis not present

## 2019-06-03 DIAGNOSIS — M5126 Other intervertebral disc displacement, lumbar region: Secondary | ICD-10-CM | POA: Diagnosis not present

## 2019-06-03 DIAGNOSIS — M5416 Radiculopathy, lumbar region: Secondary | ICD-10-CM | POA: Diagnosis not present

## 2019-06-03 DIAGNOSIS — M5186 Other intervertebral disc disorders, lumbar region: Secondary | ICD-10-CM | POA: Diagnosis not present

## 2019-06-17 ENCOUNTER — Ambulatory Visit: Payer: BLUE CROSS/BLUE SHIELD | Admitting: Cardiology

## 2019-06-19 NOTE — Assessment & Plan Note (Signed)
Left lumpectomy 04/05/2016: IDC grade 3, 2.3 cm, associated high-grade DCIS, margins negative, 0/1 lymph node, T2 N0 stage II a, ER 0%, PR 0%, HER-2 negative ratio 1.38 Patient refused adjuvant chemotherapy (patient fully understands that she is at very high risk of recurrence) Received adjuvant radiation from 05/03/2016 to 06/16/2016  Surveillance of breast cancer: 1. Breast exams 06/27/2018: Benign 2. Annual mammograms6/11/2018: No abnormalities. Density cat B  Chest and swallowing symptoms: Her primary care physician and gastroenterology and cardiology have been working her up.  She is an appointment with ENT.  I discussed with her that if no clear-cut etiology could be identified then we may consider doing a CT scan of the chest. She will call us after her ENT appointments and treatments to inform us if she does not get any improvement then we may order the CT scan.  Return to clinic in one year for surveillance checkups and follow-up with long-term survivorship

## 2019-06-20 ENCOUNTER — Telehealth: Payer: Self-pay | Admitting: *Deleted

## 2019-06-20 NOTE — Telephone Encounter (Signed)
° ° °  Please MAIL paperwork for placard to home address on file per patient.

## 2019-06-20 NOTE — Telephone Encounter (Signed)
Left a message for the pt to call back, to inform her that Dr. [name] completed her handicap placard paperwork, and we just need to know if she wants to come pick this up from the office, or have this mailed to her home address.  She can tell the operator when she calls back how she prefers getting these forms.  Operator can then send me a message back informing me how she prefers getting this paperwork.  Pt does not have to speak to me on this matter.

## 2019-06-20 NOTE — Telephone Encounter (Signed)
Will mail this out to the pt today.

## 2019-06-21 ENCOUNTER — Telehealth: Payer: Self-pay | Admitting: Hematology and Oncology

## 2019-06-21 DIAGNOSIS — M545 Low back pain: Secondary | ICD-10-CM | POA: Diagnosis not present

## 2019-06-21 DIAGNOSIS — M5416 Radiculopathy, lumbar region: Secondary | ICD-10-CM | POA: Diagnosis not present

## 2019-06-21 NOTE — Telephone Encounter (Signed)
Patient called back to request a phone visit for 9/2

## 2019-06-21 NOTE — Telephone Encounter (Signed)
I tried to call again I left a message regarding video visit

## 2019-06-25 ENCOUNTER — Telehealth: Payer: Self-pay | Admitting: Hematology and Oncology

## 2019-06-25 NOTE — Telephone Encounter (Signed)
Left voicemail to confirm appt and verify info. °

## 2019-06-25 NOTE — Progress Notes (Signed)
HEMATOLOGY-ONCOLOGY TELEPHONE VISIT PROGRESS NOTE  I connected with [name] [name] on 06/26/2019 at 10:15 AM EDT by telephone and verified that I am speaking with the correct person using two identifiers.  I discussed the limitations, risks, security and privacy concerns of performing an evaluation and management service by telephone and the availability of in person appointments.  I also discussed with the patient that there may be a patient responsible charge related to this service. The patient expressed understanding and agreed to proceed.   History of Present Illness: [name] [name] is a 57 y.o. female with above-mentioned history of triple negative left breast cancer who underwent a lumpectomy, adjuvant chemotherapy, radiation, and is currently on surveillance. I last saw her a year ago. Mammogram on 03/26/19 showed no evidence of malignancy bilaterally. She presents over the phone today for annual follow-up.  Severe Back pain due to arthritis and disc bulge.  Oncology History Overview Note      Breast cancer of upper-inner quadrant of left female breast ([place])  02/24/2016 Mammogram   Left breast mass 11:00 position 4 cm from nipple, 2.4 x 1.6 x 1.9 cm, single axillary lymph node noted, T2 N0 stage IIA clinical stage ([place])   02/24/2016 Initial Diagnosis   Moderate to poorly differentiated IDC, lymph node biopsy negative, ER 0%, PR 0%, HER-2, 2+ by IHC, fish negative, ratio 1.2, Ki-67 59%    04/05/2016 Surgery   Left lumpectomy [name]): IDC grade 3, 2.3 cm, associated high-grade DCIS, margins negative, 0/1 lymph node, T2 N0 stage II a, ER 0%, PR 0%, HER-2 negative ratio 1.38    Chemotherapy   Patient refused adjuvant chemotherapy   05/03/2016 - 06/16/2016 Radiation Therapy   Adjuvant radiation [name]). Left breast boost // 10 Gy with 5 fractions at a dose of 2 Gy/fraction.  Left breast// 50.4 Gy with 28 fractions at a dose of 1.8 Gy/ fraction   10/28/2016 Genetic Testing   Patient has  genetic testing done for personal and family history of breast cancer. ATM [id number] VUS found on genetic testing.  The Breast/GYN gene panel offered by [facility] includes sequencing and rearrangement analysis for the following 23 genes:  ATM, BARD1, BRCA1, BRCA2, BRIP1, CDH1, CHEK2, EPCAM, FANCC, MLH1, MSH2, MSH6, MUTYH, NBN, NF1, PALB2, PMS2, POLD1, PTEN, RAD51C, RAD51D, RECQL, and TP53.   Negative genetic testing for the MSH2 inversion analysis ([name]).      Observations/Objective:  Back pain issues   Assessment Plan:  Breast cancer of upper-inner quadrant of left female breast ([place]) Left lumpectomy 04/05/2016: IDC grade 3, 2.3 cm, associated high-grade DCIS, margins negative, 0/1 lymph node, T2 N0 stage II a, ER 0%, PR 0%, HER-2 negative ratio 1.38 Patient refused adjuvant chemotherapy (patient fully understands that she is at very high risk of recurrence) Received adjuvant radiation from 05/03/2016 to 06/16/2016  Surveillance of breast cancer: 1. Breast exams 06/27/2018: Benign 2. Annual mammograms6/11/2018: No abnormalities. Density cat B  Back Pain: seeing Ortho Scan: Disc issues L4-5 and arthritis Last Friday steroid epidural  Chest and swallowing symptoms: (GERD) GI and ENT has helped her   Return to clinic in one year for surveillance checkups and follow-up    I discussed the assessment and treatment plan with the patient. The patient was provided an opportunity to ask questions and all were answered. The patient agreed with the plan and demonstrated an understanding of the instructions. The patient was advised to call back or seek an in-person evaluation if the symptoms worsen or  if the condition fails to improve as anticipated.   I provided 15 minutes of non-face-to-face time during this encounter.   Rulon Eisenmenger, MD 06/26/2019    I, Molly Dorshimer, am acting as scribe for Nicholas Lose, MD.  I have reviewed the above documentation for accuracy and  completeness, and I agree with the above.

## 2019-06-26 ENCOUNTER — Inpatient Hospital Stay: Payer: BC Managed Care – PPO | Attending: Hematology and Oncology | Admitting: Hematology and Oncology

## 2019-06-26 DIAGNOSIS — C50212 Malignant neoplasm of upper-inner quadrant of left female breast: Secondary | ICD-10-CM

## 2019-06-26 DIAGNOSIS — Z171 Estrogen receptor negative status [ER-]: Secondary | ICD-10-CM

## 2019-06-27 ENCOUNTER — Telehealth: Payer: Self-pay | Admitting: Hematology and Oncology

## 2019-06-27 NOTE — Telephone Encounter (Signed)
I left a message regarding schedule  

## 2019-06-30 ENCOUNTER — Other Ambulatory Visit: Payer: Self-pay | Admitting: Physician Assistant

## 2019-07-10 NOTE — Progress Notes (Deleted)
Cardiology Office Note:    Date:  07/10/2019   ID:  [name] [name], DOB 1961-06-28, MRN [id number]  PCP:  Terald Sleeper, PA-C  Cardiologist:  No primary care provider on file.  Electrophysiologist:  None   Referring MD: Terald Sleeper, PA-C   No chief complaint on file.  History of Present Illness:    [name] [name] is a 58 y.o. female with a hx of  chronic systolic HF and NICM. She has an ICD for primary prevention, followed by Dr. [name]. Also with h/o hypothyroidism, TIA, breast cancer w/ lumpectomy (declined chemo) and multiple medication intolerances (Spironolactone caused body burning sensation, could not afford Bidil and had fatigue with high does of Entresto).  Echo done 01/2018 showed EF at 30-35%. No significant change from previous.   03/2018 -  pt notes improvement in symptoms since reducing Entresto dose. She notes less fatigue and improved exercise tolerance. Her BP has remained stable and controlled. BP today is 110/84. She denies CP. No dyspnea. She also denies orthopnea and PND but is complaining of increased abdominal distention. She notes full compliance with Lasix and restricts sodium. She is not compliant with daily weights. Feels as though her pants are fitting tighter. No LEE.   01/24/2019 -today she states that she has been doing well she denies any palpitations, ICD firing, no recent chest pain shortness of breath, no lower extremity edema orthopnea proximal nocturnal dyspnea.  She continues to experience dizziness and slight nausea after she takes Entresto.  No syncope.  07/11/2019 - the patient is coming after 6 months,    Prior CV studies:   The following studies were reviewed today:  TTE: 01/25/2019 - Left ventricle: The cavity size was moderately dilated. Systolic   function was moderately to severely reduced. The estimated   ejection fraction was in the range of 30% to 35%. Diffuse   hypokinesis. Left ventricular diastolic function parameters were   normal. -  Mitral valve: Mild prolapse of anterior leaflet and retricted   posterior leaflet There was mild regurgitation. - Impressions: GLS low normal -15.4.  Impressions: - GLS low normal -15.4.   Past Medical History:  Diagnosis Date  . AICD (automatic cardioverter/defibrillator) present 09   [employer]- replaced 2016  . Arthritis   . Bradycardia   . Breast cancer ([place]) 02/24/2016   s/p lumpectomy and radiation but refused chemotherapy  . Chronic systolic CHF (congestive heart failure) ([place])   . Cough due to angiotensin-converting enzyme inhibitor    But patient chooses to continue ACE  . Dizziness    Dizziness with standing after squatting, March, 2012  . Dyslipidemia    LDL elevation,Patient does not want statin  . Family history of breast cancer   . GERD (gastroesophageal reflux disease)   . H/O shortness of breath    CPX 10/18:  No evidence of cardiopulmonary limitation. Suspect exercise intolerance related to weight and deconditioning.   [name] Hypothyroidism   . ICD (implantable cardiac defibrillator) battery depletion    Dr. [name], June, 2009(artifactual atrial tachycardia response events addressed by reprogramming in parentheses  . Leg swelling    right leg  . Nonischemic cardiomyopathy ([place])    Etiology unknown, diagnosed 2008, patient has never had catheterization or nuclear study as of March, 2011  . Personal history of radiation therapy 2017  . TIA (transient ischemic attack)    No CT or MRI abnormality, aspirin therapy    Past Surgical History:  Procedure Laterality Date  .  BREAST BIOPSY Left 02/24/2016   U/S Core  . BREAST BIOPSY Left 02/24/2016   U/S Core  . BREAST LUMPECTOMY Left 04/05/2016   invasive ductal   . BREAST LUMPECTOMY WITH RADIOACTIVE SEED AND SENTINEL LYMPH NODE BIOPSY Left 04/05/2016   Procedure: BREAST LUMPECTOMY WITH RADIOACTIVE SEED AND SENTINEL LYMPH NODE BIOPSY;  Surgeon: [name], MD;  Location: [facility];  Service: General;  Laterality:  Left;  . CARDIAC CATHETERIZATION N/A 08/05/2016   Procedure: Left Heart Cath and Coronary Angiography;  Surgeon: Peter M Martinique, MD;  Location: [facility];  Service: Cardiovascular;  Laterality: N/A;  . CARDIAC DEFIBRILLATOR PLACEMENT  09   [employer] no remote  . COLONOSCOPY    . CYST EXCISION Left    back of leg-lt   . IMPLANTABLE CARDIOVERTER DEFIBRILLATOR GENERATOR CHANGE N/A 12/29/2014   Procedure: IMPLANTABLE CARDIOVERTER DEFIBRILLATOR GENERATOR CHANGE;  Surgeon: [name], MD;  Location: [facility];  Service: Cardiovascular;  Laterality: N/A;  . LATERAL EPICONDYLE RELEASE  10/11/2012   Procedure: TENNIS ELBOW RELEASE;  Surgeon: [name], MD;  Location: [facility];  Service: Orthopedics;  Laterality: Right;  RIGHT ELBOW: TENOTOMY ELBOW LATERAL EPICONDYLITIS TENNIS ELBOW: RADIAL TUNNEL RELEASE  . PORT-A-CATH REMOVAL  04/13/2016   Procedure: MINOR REMOVAL PORT-A-CATH;  Surgeon: [name], MD;  Location: [facility];  Service: General;;  . PORTACATH PLACEMENT Right 04/05/2016   Procedure: INSERTION PORT-A-CATH WITH [place];  Surgeon: [name], MD;  Location: [facility];  Service: General;  Laterality: Right;  right IJ  . TOTAL THYROIDECTOMY  2001  . TUBAL LIGATION  94  . UPPER GI ENDOSCOPY      Current Medications: No outpatient medications have been marked as taking for the 07/11/19 encounter (Appointment) with Dorothy Spark, MD.     Allergies:   Avelox [moxifloxacin hcl in nacl], Clindamycin/lincomycin, Kiwi extract, Bactrim [sulfamethoxazole-trimethoprim], Levofloxacin, Moxifloxacin, Spironolactone, and Sulfa antibiotics   Social History   Socioeconomic History  . Marital status: Widowed    Spouse name: Not on file  . Number of children: Not on file  . Years of education: Not on file  . Highest education level: Not on file  Occupational History  . 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 Financial resource strain: Not on  file  . Food insecurity    Worry: Not on file    Inability: Not on file  . Transportation needs    Medical: Not on file    Non-medical: Not on file  Tobacco Use  . Smoking status: Never Smoker  . Smokeless tobacco: Never Used  Substance and Sexual Activity  . Alcohol use: No  . Drug use: No  . Sexual activity: Not on file  Lifestyle  . Physical activity    Days per week: Not on file    Minutes per session: Not on file  . Stress: Not on file  Relationships  . Social Herbalist on phone: Not on file    Gets together: Not on file    Attends religious service: Not on file    Active member of club or organization: Not on file    Attends meetings of clubs or organizations: Not on file    Relationship status: Not on file  Other Topics Concern  . Not on file  Social History Narrative  . Not on file     Family History: The patient's family history includes Breast cancer in her maternal aunt;  Breast cancer (age of onset: 27) in her mother; Cancer in her maternal aunt, mother, and paternal grandmother; Heart attack in her mother; Hypertension in her brother and sister. There is no history of Stroke or Diabetes.  ROS:   Please see the history of present illness.    All other systems reviewed and are negative.  EKGs/Labs/Other Studies Reviewed:    The following studies were reviewed today:  EKG:  EKG is ordered today.  The ekg ordered today demonstrates ***  Recent Labs: 05/15/2019: ALT 20; BUN 17; Creatinine, Ser 0.87; Hemoglobin 10.9; NT-Pro BNP 154; Platelets 208; Potassium 4.2; Sodium 145; TSH 1.020  Recent Lipid Panel    Component Value Date/Time   CHOL 207 (H) 05/15/2019 1034   TRIG 81 05/15/2019 1034   HDL 64 05/15/2019 1034   CHOLHDL 3.2 05/15/2019 1034   LDLCALC 127 (H) 05/15/2019 1034    Physical Exam:    VS:  There were no vitals taken for this visit.    Wt Readings from Last 3 Encounters:  01/24/19 205 lb (93 kg)  12/21/18 216 lb 9.6 oz (98.2  kg)  09/04/18 217 lb (98.4 kg)     GEN: Well nourished, well developed in no acute distress HEENT: Normal NECK: No JVD; No carotid bruits LYMPHATICS: No lymphadenopathy CARDIAC: RRR, no murmurs, rubs, gallops RESPIRATORY:  Clear to auscultation without rales, wheezing or rhonchi  ABDOMEN: Soft, non-tender, non-distended MUSCULOSKELETAL:  No edema; No deformity  SKIN: Warm and dry NEUROLOGIC:  Alert and oriented x 3 PSYCHIATRIC:  Normal affect   ASSESSMENT:    No diagnosis found. PLAN:    In order of problems listed above:  1.  NICM/Chronic Systolic HF: EF stable at 30-35%, by recent echo.  She appears euvolemic, no ICD firing, still mild intolerance to [facility], will continue current dose, we will see her in person in 4 months and repeat an echo and labs prior to that appointment including CBC, BNP, CMP, TSH, lipids.    2.  Hyperlipidemia -she has mildly elevated LDL however she is now on a diet and started to exercise we will repeat prior to the next visit.   3.  Status post ICD placement -no ICD firing till date, no infection.   Medication Adjustments/Labs and Tests Ordered: Current medicines are reviewed at length with the patient today.  Concerns regarding medicines are outlined above.  No orders of the defined types were placed in this encounter.  No orders of the defined types were placed in this encounter.   There are no Patient Instructions on file for this visit.   Signed, Ena Dawley, MD  07/10/2019 10:11 PM    [place]

## 2019-07-11 ENCOUNTER — Ambulatory Visit: Payer: BC Managed Care – PPO | Admitting: Cardiology

## 2019-07-11 ENCOUNTER — Other Ambulatory Visit: Payer: Self-pay | Admitting: Physician Assistant

## 2019-07-11 ENCOUNTER — Telehealth: Payer: Self-pay | Admitting: Cardiology

## 2019-07-11 ENCOUNTER — Other Ambulatory Visit: Payer: Self-pay

## 2019-07-11 NOTE — Telephone Encounter (Signed)
Follow up   The patient states that her A1C test she is having done at her pcp office. Please call if needed.

## 2019-07-11 NOTE — Telephone Encounter (Signed)
New message     Pt want to know if she can have a HgbA1c test when she comes on Monday for her follow up.  Please call and let her know

## 2019-07-11 NOTE — Telephone Encounter (Signed)
Informed the pt that I placed a note in her appt notes that she would like to have her hemoglobin A1C checked at her OV appt on 9/21 with Dr. [name].  Informed the pt that we will check this lab at the time of her appt.  Pt verbalized understanding and agrees with this plan.

## 2019-07-12 ENCOUNTER — Encounter: Payer: Self-pay | Admitting: Physician Assistant

## 2019-07-12 ENCOUNTER — Ambulatory Visit (INDEPENDENT_AMBULATORY_CARE_PROVIDER_SITE_OTHER): Payer: BC Managed Care – PPO | Admitting: Physician Assistant

## 2019-07-12 VITALS — BP 117/79 | HR 73 | Temp 98.2°F | Ht 68.0 in | Wt 216.0 lb

## 2019-07-12 DIAGNOSIS — M47816 Spondylosis without myelopathy or radiculopathy, lumbar region: Secondary | ICD-10-CM | POA: Diagnosis not present

## 2019-07-12 DIAGNOSIS — R7309 Other abnormal glucose: Secondary | ICD-10-CM

## 2019-07-12 DIAGNOSIS — M5126 Other intervertebral disc displacement, lumbar region: Secondary | ICD-10-CM | POA: Diagnosis not present

## 2019-07-12 DIAGNOSIS — D649 Anemia, unspecified: Secondary | ICD-10-CM

## 2019-07-12 DIAGNOSIS — M5136 Other intervertebral disc degeneration, lumbar region: Secondary | ICD-10-CM

## 2019-07-12 LAB — CBC WITH DIFFERENTIAL/PLATELET
Basophils Absolute: 0 10*3/uL (ref 0.0–0.2)
Basos: 1 %
EOS (ABSOLUTE): 0.2 10*3/uL (ref 0.0–0.4)
Eos: 5 %
Hematocrit: 34 % (ref 34.0–46.6)
Hemoglobin: 11.3 g/dL (ref 11.1–15.9)
Immature Grans (Abs): 0 10*3/uL (ref 0.0–0.1)
Immature Granulocytes: 0 %
Lymphocytes Absolute: 2.2 10*3/uL (ref 0.7–3.1)
Lymphs: 50 %
MCH: 29.9 pg (ref 26.6–33.0)
MCHC: 33.2 g/dL (ref 31.5–35.7)
MCV: 90 fL (ref 79–97)
Monocytes Absolute: 0.3 10*3/uL (ref 0.1–0.9)
Monocytes: 6 %
Neutrophils Absolute: 1.7 10*3/uL (ref 1.4–7.0)
Neutrophils: 38 %
Platelets: 250 10*3/uL (ref 150–450)
RBC: 3.78 x10E6/uL (ref 3.77–5.28)
RDW: 12.6 % (ref 11.7–15.4)
WBC: 4.4 10*3/uL (ref 3.4–10.8)

## 2019-07-12 LAB — BAYER DCA HB A1C WAIVED: HB A1C (BAYER DCA - WAIVED): 5.8 % (ref <7.0)

## 2019-07-12 MED ORDER — GABAPENTIN 100 MG PO CAPS: 100.0000 mg | ORAL_CAPSULE | Freq: Every day | ORAL | 3 refills | Status: DC

## 2019-07-15 ENCOUNTER — Other Ambulatory Visit: Payer: Self-pay

## 2019-07-15 ENCOUNTER — Encounter: Payer: Self-pay | Admitting: Cardiology

## 2019-07-15 ENCOUNTER — Ambulatory Visit: Payer: BC Managed Care – PPO | Admitting: Cardiology

## 2019-07-15 VITALS — BP 122/80 | HR 71 | Ht 68.0 in | Wt 219.0 lb

## 2019-07-15 DIAGNOSIS — E782 Mixed hyperlipidemia: Secondary | ICD-10-CM | POA: Diagnosis not present

## 2019-07-15 DIAGNOSIS — Z95810 Presence of automatic (implantable) cardiac defibrillator: Secondary | ICD-10-CM | POA: Diagnosis not present

## 2019-07-15 DIAGNOSIS — D649 Anemia, unspecified: Secondary | ICD-10-CM | POA: Insufficient documentation

## 2019-07-15 DIAGNOSIS — M47816 Spondylosis without myelopathy or radiculopathy, lumbar region: Secondary | ICD-10-CM | POA: Insufficient documentation

## 2019-07-15 NOTE — Progress Notes (Signed)
BP 117/79   Pulse 73   Temp 98.2 F (36.8 C) (Temporal)   Ht 5\' 8"  (1.727 m)   Wt 216 lb (98 kg)   SpO2 97%   BMI 32.84 kg/m    Subjective:    Patient ID: [name] [name], female    DOB: [birth year], 58 y.o.   MRN: [id number]  HPI: [name] [name] is a 58 y.o. female presenting on 07/12/2019 for Medical Management of Chronic Issues (would like labs also ), Hyperlipidemia, and Hypothyroidism  This patient comes in for periodic recheck on her chronic medical conditions.  They do include degenerative disc disease with myelopathy, chronic heart failure, GERD, history of breast cancer, reported hyperglycemia.  She states that there are several people in her family that have diabetes.  And she would like to have labs performed to make sure that hers is obtained.  In previous readings her glucose has been normal.  She did have a mammogram that showed some disc bulging.  She is up-to-date on her colonoscopy and mammogram.  And she still goes to gynecology for her annual exam.  She is also still followed by cardiology on a regular basis  Past Medical History:  Diagnosis Date  . AICD (automatic cardioverter/defibrillator) present 09   [employer]- replaced 2016  . Arthritis   . Bradycardia   . Breast cancer ([place]) 02/24/2016   s/p lumpectomy and radiation but refused chemotherapy  . Chronic systolic CHF (congestive heart failure) ([place])   . Cough due to angiotensin-converting enzyme inhibitor    But patient chooses to continue ACE  . Dizziness    Dizziness with standing after squatting, March, 2012  . Dyslipidemia    LDL elevation,Patient does not want statin  . Family history of breast cancer   . GERD (gastroesophageal reflux disease)   . H/O shortness of breath    CPX 10/18:  No evidence of cardiopulmonary limitation. Suspect exercise intolerance related to weight and deconditioning.   [name] Hypothyroidism   . ICD (implantable cardiac defibrillator) battery depletion    Dr. [name], June,  2009(artifactual atrial tachycardia response events addressed by reprogramming in parentheses  . Leg swelling    right leg  . Nonischemic cardiomyopathy ([place])    Etiology unknown, diagnosed 2008, patient has never had catheterization or nuclear study as of March, 2011  . Personal history of radiation therapy 2017  . TIA (transient ischemic attack)    No CT or MRI abnormality, aspirin therapy   Relevant past medical, surgical, family and social history reviewed and updated as indicated. Interim medical history since our last visit reviewed. Allergies and medications reviewed and updated. DATA REVIEWED: CHART IN EPIC  Family History reviewed for pertinent findings.  Review of Systems  Constitutional: Positive for fatigue.  HENT: Negative.   Eyes: Negative.   Respiratory: Negative.   Gastrointestinal: Negative.   Genitourinary: Negative.   Musculoskeletal: Positive for arthralgias.  Neurological: Negative.     Allergies as of 07/12/2019      Reactions   Avelox [moxifloxacin Hcl In Nacl] Shortness Of Breath   Shortness of breath   Clindamycin/lincomycin Shortness Of Breath, Diarrhea   Breathing/diarrhea   Kiwi Extract Anaphylaxis   Makes pt. Feel like her throat is closing   Bactrim [sulfamethoxazole-trimethoprim] Swelling   Levofloxacin Other (See Comments)   JOINT PAIN JOINT PAIN   Moxifloxacin Other (See Comments)   Breathing problems Breathing problems   Spironolactone Other (See Comments)   burning   Sulfa Antibiotics Swelling  Medication List       Accurate as of July 12, 2019 11:59 PM. If you have any questions, ask your nurse or doctor.        STOP taking these medications   amitriptyline 10 MG tablet Commonly known as: ELAVIL Stopped by: Terald Sleeper, PA-C   ciclopirox 8 % solution Commonly known as: PENLAC Stopped by: Terald Sleeper, PA-C   cyclobenzaprine 10 MG tablet Commonly known as: FLEXERIL Stopped by: Terald Sleeper, PA-C    meloxicam 15 MG tablet Commonly known as: MOBIC Stopped by: Terald Sleeper, PA-C     TAKE these medications   aspirin EC 81 MG tablet Take 1 tablet (81 mg total) by mouth daily.   carvedilol 6.25 MG tablet Commonly known as: COREG TAKE 1 TABLET BY MOUTH TWO  TIMES DAILY   dicyclomine 10 MG capsule Commonly known as: BENTYL Take 10 mg by mouth as directed.   Entresto 24-26 MG Generic drug: sacubitril-valsartan Take 1 tablet by mouth twice daily   furosemide 20 MG tablet Commonly known as: LASIX Take 1 tablet by mouth once daily   gabapentin 100 MG capsule Commonly known as: NEURONTIN Take 1-3 capsules (100-300 mg total) by mouth at bedtime. Started by: Terald Sleeper, PA-C   ipratropium 0.06 % nasal spray Commonly known as: ATROVENT USE 2 DOSES IN EACH NOSTRIL TWICE DAILY AS NEEDED FOR DRAINAGE   levothyroxine 88 MCG tablet Commonly known as: SYNTHROID TAKE 1 TABLET BY MOUTH  DAILY   omeprazole 40 MG capsule Commonly known as: PRILOSEC Take 40 mg by mouth daily.          Objective:    BP 117/79   Pulse 73   Temp 98.2 F (36.8 C) (Temporal)   Ht 5\' 8"  (1.727 m)   Wt 216 lb (98 kg)   SpO2 97%   BMI 32.84 kg/m   Allergies  Allergen Reactions  . Avelox [Moxifloxacin Hcl In Nacl] Shortness Of Breath    Shortness of breath  . Clindamycin/Lincomycin Shortness Of Breath and Diarrhea    Breathing/diarrhea    . Kiwi Extract Anaphylaxis    Makes pt. Feel like her throat is closing   . Bactrim [Sulfamethoxazole-Trimethoprim] Swelling  . Levofloxacin Other (See Comments)    JOINT PAIN JOINT PAIN  . Moxifloxacin Other (See Comments)    Breathing problems Breathing problems  . Spironolactone Other (See Comments)    burning  . Sulfa Antibiotics Swelling    Wt Readings from Last 3 Encounters:  07/15/19 219 lb (99.3 kg)  07/12/19 216 lb (98 kg)  01/24/19 205 lb (93 kg)    Physical Exam Constitutional:      General: She is not in acute distress.     Appearance: Normal appearance. She is well-developed.  HENT:     Head: Normocephalic and atraumatic.  Cardiovascular:     Rate and Rhythm: Normal rate.  Pulmonary:     Effort: Pulmonary effort is normal.  Skin:    General: Skin is warm and dry.     Findings: No rash.  Neurological:     Mental Status: She is alert and oriented to person, place, and time.     Deep Tendon Reflexes: Reflexes are normal and symmetric.     Results for orders placed or performed in visit on 07/12/19  Bayer DCA Hb A1c Waived  Result Value Ref Range   HB A1C (BAYER DCA - WAIVED) 5.8 <7.0 %  CBC with Differential/Platelet  Result Value Ref Range   WBC 4.4 3.4 - 10.8 x10E3/uL   RBC 3.78 3.77 - 5.28 x10E6/uL   Hemoglobin 11.3 11.1 - 15.9 g/dL   Hematocrit 34.0 34.0 - 46.6 %   MCV 90 79 - 97 fL   MCH 29.9 26.6 - 33.0 pg   MCHC 33.2 31.5 - 35.7 g/dL   RDW 12.6 11.7 - 15.4 %   Platelets 250 150 - 450 x10E3/uL   Neutrophils 38 Not Estab. %   Lymphs 50 Not Estab. %   Monocytes 6 Not Estab. %   Eos 5 Not Estab. %   Basos 1 Not Estab. %   Neutrophils Absolute 1.7 1.4 - 7.0 x10E3/uL   Lymphocytes Absolute 2.2 0.7 - 3.1 x10E3/uL   Monocytes Absolute 0.3 0.1 - 0.9 x10E3/uL   EOS (ABSOLUTE) 0.2 0.0 - 0.4 x10E3/uL   Basophils Absolute 0.0 0.0 - 0.2 x10E3/uL   Immature Granulocytes 0 Not Estab. %   Immature Grans (Abs) 0.0 0.0 - 0.1 x10E3/uL      Assessment & Plan:   1. L4-L5 disc bulge - gabapentin (NEURONTIN) 100 MG capsule; Take 1-3 capsules (100-300 mg total) by mouth at bedtime. (Patient not taking: Reported on 07/15/2019)  Dispense: 90 capsule; Refill: 3  2. Lumbar arthropathy - gabapentin (NEURONTIN) 100 MG capsule; Take 1-3 capsules (100-300 mg total) by mouth at bedtime. (Patient not taking: Reported on 07/15/2019)  Dispense: 90 capsule; Refill: 3  3. Elevated glucose - Bayer DCA Hb A1c Waived  4. Anemia, unspecified type - CBC with Differential/Platelet   Continue all other maintenance  medications as listed above.  Follow up plan: No follow-ups on file.  Educational handout given for [place] PA-C [place] [address]  [place], [place] [address] [phone]   07/15/2019, 10:58 PM

## 2019-07-15 NOTE — Progress Notes (Signed)
Cardiology Office Note:    Date:  07/15/2019   ID:  SHAGUANA CHAI, DOB Dec 15, 1960, MRN [id number]  PCP:  Terald Sleeper, PA-C  Cardiologist:  No primary care provider on file.  Electrophysiologist:  None   Referring MD: Terald Sleeper, PA-C   Chief complain: back pain  History of Present Illness:    [name] [name] is a 58 y.o. female with a hx of  chronic systolic HF and NICM. She has an ICD for primary prevention, followed by Dr. [name]. Also with h/o hypothyroidism, TIA, breast cancer w/ lumpectomy (declined chemo) and multiple medication intolerances (Spironolactone caused body burning sensation, could not afford Bidil and had fatigue with high does of Entresto).  Echo done 01/2018 showed EF at 30-35%. No significant change from previous.   03/2018 -  pt notes improvement in symptoms since reducing Entresto dose. She notes less fatigue and improved exercise tolerance. Her BP has remained stable and controlled. BP today is 110/84. She denies CP. No dyspnea. She also denies orthopnea and PND but is complaining of increased abdominal distention. She notes full compliance with Lasix and restricts sodium. She is not compliant with daily weights. Feels as though her pants are fitting tighter. No LEE.   01/24/2019 -today she states that she has been doing well she denies any palpitations, ICD firing, no recent chest pain shortness of breath, no lower extremity edema orthopnea proximal nocturnal dyspnea.  She continues to experience dizziness and slight nausea after she takes Entresto.  No syncope.  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   Prior CV studies:   The following studies were reviewed today:  TTE: 05/15/2019 -  1. The left ventricle has severely reduced systolic function, with an ejection fraction of 25-30%. The cavity size was mildly  dilated. Left ventricular diastolic Doppler parameters are consistent with impaired relaxation. Left ventrical global  hypokinesis without regional wall motion abnormalities.  2. The right ventricle has normal systolic function. The cavity was mildly enlarged. There is no increase in right ventricular wall thickness. Right ventricular systolic pressure is normal with an estimated pressure of 27.4 mmHg.  3. Left atrial size was mildly dilated.  4. The mitral valve is myxomatous. Mild thickening of the mitral valve leaflet. Mitral valve regurgitation is mild by color flow Doppler. The MR jet is posteriorly-directed.  5. The aorta is normal in size and structure.  6. When compared to the prior study: Compared to April 2019, LV global longitudinal strain appears unchanged.  7. The average left ventricular global longitudinal strain is -16.3 %.  Past Medical History:  Diagnosis Date  . AICD (automatic cardioverter/defibrillator) present 09   [employer]- replaced 2016  . Arthritis   . Bradycardia   . Breast cancer ([place]) 02/24/2016   s/p lumpectomy and radiation but refused chemotherapy  . Chronic systolic CHF (congestive heart failure) ([place])   . Cough due to angiotensin-converting enzyme inhibitor    But patient chooses to continue ACE  . Dizziness    Dizziness with standing after squatting, March, 2012  . Dyslipidemia    LDL elevation,Patient does not want statin  . Family history of breast cancer   . GERD (gastroesophageal reflux disease)   . H/O shortness of breath    CPX 10/18:  No evidence of cardiopulmonary limitation. Suspect exercise intolerance related to  weight and deconditioning.   [name] Hypothyroidism   . ICD (implantable cardiac defibrillator) battery depletion    Dr. [name], June, 2009(artifactual atrial tachycardia response events addressed by reprogramming in parentheses  . Leg swelling    right leg  . Nonischemic cardiomyopathy ([place])    Etiology unknown, diagnosed 2008,  patient has never had catheterization or nuclear study as of March, 2011  . Personal history of radiation therapy 2017  . TIA (transient ischemic attack)    No CT or MRI abnormality, aspirin therapy    Past Surgical History:  Procedure Laterality Date  . BREAST BIOPSY Left 02/24/2016   U/S Core  . BREAST BIOPSY Left 02/24/2016   U/S Core  . BREAST LUMPECTOMY Left 04/05/2016   invasive ductal   . BREAST LUMPECTOMY WITH RADIOACTIVE SEED AND SENTINEL LYMPH NODE BIOPSY Left 04/05/2016   Procedure: BREAST LUMPECTOMY WITH RADIOACTIVE SEED AND SENTINEL LYMPH NODE BIOPSY;  Surgeon: [name], MD;  Location: [facility];  Service: General;  Laterality: Left;  . CARDIAC CATHETERIZATION N/A 08/05/2016   Procedure: Left Heart Cath and Coronary Angiography;  Surgeon: Peter M Martinique, MD;  Location: [facility];  Service: Cardiovascular;  Laterality: N/A;  . CARDIAC DEFIBRILLATOR PLACEMENT  09   [employer] no remote  . COLONOSCOPY    . CYST EXCISION Left    back of leg-lt   . IMPLANTABLE CARDIOVERTER DEFIBRILLATOR GENERATOR CHANGE N/A 12/29/2014   Procedure: IMPLANTABLE CARDIOVERTER DEFIBRILLATOR GENERATOR CHANGE;  Surgeon: [name], MD;  Location: [facility] CATH [facility];  Service: Cardiovascular;  Laterality: N/A;  . LATERAL EPICONDYLE RELEASE  10/11/2012   Procedure: TENNIS ELBOW RELEASE;  Surgeon: [name], MD;  Location: [facility];  Service: Orthopedics;  Laterality: Right;  RIGHT ELBOW: TENOTOMY ELBOW LATERAL EPICONDYLITIS TENNIS ELBOW: RADIAL TUNNEL RELEASE  . PORT-A-CATH REMOVAL  04/13/2016   Procedure: MINOR REMOVAL PORT-A-CATH;  Surgeon: [name], MD;  Location: [facility];  Service: General;;  . PORTACATH PLACEMENT Right 04/05/2016   Procedure: INSERTION PORT-A-CATH WITH [place];  Surgeon: [name], MD;  Location: [facility];  Service: General;  Laterality: Right;  right IJ  . TOTAL THYROIDECTOMY  2001  . TUBAL LIGATION  94  . UPPER GI ENDOSCOPY       Current Medications: Current Meds  Medication Sig  . aspirin EC 81 MG tablet Take 1 tablet (81 mg total) by mouth daily.  . carvedilol (COREG) 6.25 MG tablet TAKE 1 TABLET BY MOUTH TWO  TIMES DAILY  . dicyclomine (BENTYL) 10 MG capsule Take 10 mg by mouth as directed.  [name] ENTRESTO 24-26 MG Take 1 tablet by mouth twice daily  . fluticasone (FLONASE) 50 MCG/ACT nasal spray Place 2 sprays into both nostrils daily.  . furosemide (LASIX) 20 MG tablet Take 1 tablet by mouth once daily  . levothyroxine (SYNTHROID) 88 MCG tablet TAKE 1 TABLET BY MOUTH  DAILY  . omeprazole (PRILOSEC) 40 MG capsule Take 40 mg by mouth daily.     Allergies:   Avelox [moxifloxacin hcl in nacl], Clindamycin/lincomycin, Kiwi extract, Bactrim [sulfamethoxazole-trimethoprim], Levofloxacin, Moxifloxacin, Spironolactone, and Sulfa antibiotics   Social History   Socioeconomic History  . Marital status: Widowed    Spouse name: Not on file  . Number of children: Not on file  . Years of education: Not on file  . Highest education level: Not on file  Occupational History  . 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 Financial resource strain: Not on file  .  Food insecurity    Worry: Not on file    Inability: Not on file  . Transportation needs    Medical: Not on file    Non-medical: Not on file  Tobacco Use  . Smoking status: Never Smoker  . Smokeless tobacco: Never Used  Substance and Sexual Activity  . Alcohol use: No  . Drug use: No  . Sexual activity: Not on file  Lifestyle  . Physical activity    Days per week: Not on file    Minutes per session: Not on file  . Stress: Not on file  Relationships  . Social Herbalist on phone: Not on file    Gets together: Not on file    Attends religious service: Not on file    Active member of club or organization: Not on file    Attends meetings of clubs or organizations: Not on file    Relationship status: Not on file  Other Topics Concern  . Not on file   Social History Narrative  . Not on file     Family History: The patient's family history includes Breast cancer in her maternal aunt; Breast cancer (age of onset: 52) in her mother; Cancer in her maternal aunt, mother, and paternal grandmother; Heart attack in her mother; Hypertension in her brother and sister. There is no history of Stroke or Diabetes.  ROS:   Please see the history of present illness.    All other systems reviewed and are negative.  EKGs/Labs/Other Studies Reviewed:    The following studies were reviewed today: TTE: 05/15/2019  1. The left ventricle has severely reduced systolic function, with an ejection fraction of 25-30%. The cavity size was mildly dilated. Left ventricular diastolic Doppler parameters are consistent with impaired relaxation. Left ventrical global  hypokinesis without regional wall motion abnormalities.  2. The right ventricle has normal systolic function. The cavity was mildly enlarged. There is no increase in right ventricular wall thickness. Right ventricular systolic pressure is normal with an estimated pressure of 27.4 mmHg.  3. Left atrial size was mildly dilated.  4. The mitral valve is myxomatous. Mild thickening of the mitral valve leaflet. Mitral valve regurgitation is mild by color flow Doppler. The MR jet is posteriorly-directed.  5. The aorta is normal in size and structure.  6. When compared to the prior study: Compared to April 2019, LV global longitudinal strain appears unchanged.  7. The average left ventricular global longitudinal strain is -16.3 %.   EKG:  EKG is ordered today.  The ekg ordered today demonstrates SR, normal ECG, personally reviewed  Recent Labs: 05/15/2019: ALT 20; BUN 17; Creatinine, Ser 0.87; NT-Pro BNP 154; Potassium 4.2; Sodium 145; TSH 1.020 07/12/2019: Hemoglobin 11.3; Platelets 250  Recent Lipid Panel    Component Value Date/Time   CHOL 207 (H) 05/15/2019 1034   TRIG 81 05/15/2019 1034   HDL 64  05/15/2019 1034   CHOLHDL 3.2 05/15/2019 1034   LDLCALC 127 (H) 05/15/2019 1034    Physical Exam:    VS:  BP 122/80   Pulse 71   Ht 5\' 8"  (1.727 m)   Wt 219 lb (99.3 kg)   SpO2 99%   BMI 33.30 kg/m     Wt Readings from Last 3 Encounters:  07/15/19 219 lb (99.3 kg)  07/12/19 216 lb (98 kg)  01/24/19 205 lb (93 kg)     GEN: Well nourished, well developed in no acute distress HEENT: Normal NECK: No JVD;  No carotid bruits LYMPHATICS: No lymphadenopathy CARDIAC: RRR, no murmurs, rubs, gallops RESPIRATORY:  Clear to auscultation without rales, wheezing or rhonchi  ABDOMEN: Soft, non-tender, non-distended MUSCULOSKELETAL:  No edema; No deformity  SKIN: Warm and dry NEUROLOGIC:  Alert and oriented x 3 PSYCHIATRIC:  Normal affect   ASSESSMENT:    1. Mixed hyperlipidemia   2. S/P ICD (internal cardiac defibrillator) procedure    PLAN:    In order of problems listed above:  1.  NICM/Chronic Systolic HF: EF slightly down from 30-35% to 25-30% on echo in 04/2019.  She appears euvolemic, no ICD firing, tolerates low dose  Entresto, will continue current dose, follow up in 6 months.  2.  Hyperlipidemia -she has mildly elevated LDL, she refuses to take statins, we will focus on diet and exercise.  3.  Status post ICD placement -no ICD firing till date, no infection.  4. Hypertension - well controlled.  Medication Adjustments/Labs and Tests Ordered: Current medicines are reviewed at length with the patient today.  Concerns regarding medicines are outlined above.  Orders Placed This Encounter  Procedures  . EKG 12-Lead   No orders of the defined types were placed in this encounter.   Patient Instructions  Medication Instructions:   Your physician recommends that you continue on your current medications as directed. Please refer to the Current Medication list given to you today.  If you need a refill on your cardiac medications before your next appointment, please call  your pharmacy.     Follow-Up: At [facility], you and your health needs are our priority.  As part of our continuing mission to provide you with exceptional heart care, we have created designated Provider Care Teams.  These Care Teams include your primary Cardiologist (physician) and Advanced Practice Providers (APPs -  Physician Assistants and Nurse Practitioners) who all work together to provide you with the care you need, when you need it. You will need a follow up appointment in 6 months DR. Maygen Sirico.  Please call our office 2 months in advance to schedule this appointment.  You may see DR. Taaj Hurlbut or one of the following Advanced Practice Providers on your designated Care Team:   Lyda Jester, PA-C Dayna Dunn, PA-C . Ermalinda Barrios, PA-C        Signed, Ena Dawley, MD  07/15/2019 8:57 AM    [place]

## 2019-07-15 NOTE — Patient Instructions (Signed)
Medication Instructions:   Your physician recommends that you continue on your current medications as directed. Please refer to the Current Medication list given to you today.  If you need a refill on your cardiac medications before your next appointment, please call your pharmacy.     Follow-Up: At [facility], you and your health needs are our priority.  As part of our continuing mission to provide you with exceptional heart care, we have created designated Provider Care Teams.  These Care Teams include your primary Cardiologist (physician) and Advanced Practice Providers (APPs -  Physician Assistants and Nurse Practitioners) who all work together to provide you with the care you need, when you need it. You will need a follow up appointment in 6 months DR. NELSON.  Please call our office 2 months in advance to schedule this appointment.  You may see DR. NELSON or one of the following Advanced Practice Providers on your designated Care Team:   Lyda Jester, PA-C Dayna Dunn, PA-C . Ermalinda Barrios, PA-C

## 2019-07-16 ENCOUNTER — Telehealth: Payer: Self-pay | Admitting: Physician Assistant

## 2019-07-16 ENCOUNTER — Encounter: Payer: Self-pay | Admitting: *Deleted

## 2019-07-16 NOTE — Telephone Encounter (Signed)
Aware of results. 

## 2019-08-15 ENCOUNTER — Ambulatory Visit (INDEPENDENT_AMBULATORY_CARE_PROVIDER_SITE_OTHER): Payer: BC Managed Care – PPO | Admitting: *Deleted

## 2019-08-15 DIAGNOSIS — I5022 Chronic systolic (congestive) heart failure: Secondary | ICD-10-CM | POA: Diagnosis not present

## 2019-08-15 LAB — CUP PACEART REMOTE DEVICE CHECK
Battery Remaining Longevity: 96 mo
Battery Remaining Percentage: 100 %
Brady Statistic RA Percent Paced: 0 %
Brady Statistic RV Percent Paced: 0 %
Date Time Interrogation Session: 20201022102816
HighPow Impedance: 49 Ohm
Implantable Lead Implant Date: 20090320
Implantable Lead Implant Date: 20090320
Implantable Lead Location: 753859
Implantable Lead Location: 753860
Implantable Lead Model: 158
Implantable Lead Model: 5076
Implantable Lead Serial Number: 182504
Implantable Pulse Generator Implant Date: 20160307
Lead Channel Impedance Value: 486 Ohm
Lead Channel Impedance Value: 516 Ohm
Lead Channel Pacing Threshold Amplitude: 0.4 V
Lead Channel Pacing Threshold Amplitude: 0.9 V
Lead Channel Pacing Threshold Pulse Width: 0.4 ms
Lead Channel Pacing Threshold Pulse Width: 0.4 ms
Lead Channel Setting Pacing Amplitude: 2 V
Lead Channel Setting Pacing Amplitude: 2.5 V
Lead Channel Setting Pacing Pulse Width: 0.4 ms
Lead Channel Setting Sensing Sensitivity: 0.6 mV
Pulse Gen Serial Number: 111826

## 2019-08-22 DIAGNOSIS — M545 Low back pain: Secondary | ICD-10-CM | POA: Diagnosis not present

## 2019-08-27 NOTE — Progress Notes (Signed)
Remote ICD transmission.   

## 2019-09-04 ENCOUNTER — Other Ambulatory Visit: Payer: Self-pay

## 2019-09-04 DIAGNOSIS — Z20822 Contact with and (suspected) exposure to covid-19: Secondary | ICD-10-CM

## 2019-09-05 ENCOUNTER — Encounter: Payer: Self-pay | Admitting: Family Medicine

## 2019-09-05 ENCOUNTER — Ambulatory Visit (INDEPENDENT_AMBULATORY_CARE_PROVIDER_SITE_OTHER): Payer: BC Managed Care – PPO | Admitting: Family Medicine

## 2019-09-05 DIAGNOSIS — Z20822 Contact with and (suspected) exposure to covid-19: Secondary | ICD-10-CM

## 2019-09-05 DIAGNOSIS — Z20828 Contact with and (suspected) exposure to other viral communicable diseases: Secondary | ICD-10-CM

## 2019-09-05 DIAGNOSIS — J069 Acute upper respiratory infection, unspecified: Secondary | ICD-10-CM | POA: Diagnosis not present

## 2019-09-05 NOTE — Progress Notes (Signed)
Virtual Visit via telephone Note Due to COVID-19 pandemic this visit was conducted virtually. This visit type was conducted due to national recommendations for restrictions regarding the COVID-19 Pandemic (e.g. social distancing, sheltering in place) in an effort to limit this patient's exposure and mitigate transmission in our community. All issues noted in this document were discussed and addressed.  A physical exam was not performed with this format.   I connected with [name] [name] on 09/05/2019 at 0755 by telephone and verified that I am speaking with the correct person using two identifiers. [name] [name] is currently located at home and no one is currently with them during visit. The provider, Monia Pouch, FNP is located in their office at time of visit.  I discussed the limitations, risks, security and privacy concerns of performing an evaluation and management service by telephone and the availability of in person appointments. I also discussed with the patient that there may be a patient responsible charge related to this service. The patient expressed understanding and agreed to proceed.  Subjective:  Patient ID: [name] [name], female    DOB: [birth year], 58 y.o.   MRN: [id number]  Chief Complaint:  URI   HPI: [name] [name] is a 58 y.o. female presenting on 09/05/2019 for URI   Pt reports feeling bad for about 4 days. States her symptoms started with congestion, sinus pressure, headaches, and sore throat. States she now has malaise / fatigue, decreased appetite, and chills. She denies elevated temperature at home. States she just does not feel good. States she has been taking Zyrtec without relief of symptoms. She was tested yesterday for COVID-19 and is awaiting results. She states someone at her place of employment did have COVID-19 but she was not directly exposed to them.     Relevant past medical, surgical, family, and social history reviewed and updated as indicated.  Allergies  and medications reviewed and updated.   Past Medical History:  Diagnosis Date  . AICD (automatic cardioverter/defibrillator) present 09   [employer]- replaced 2016  . Arthritis   . Bradycardia   . Breast cancer ([place]) 02/24/2016   s/p lumpectomy and radiation but refused chemotherapy  . Chronic systolic CHF (congestive heart failure) ([place])   . Cough due to angiotensin-converting enzyme inhibitor    But patient chooses to continue ACE  . Dizziness    Dizziness with standing after squatting, March, 2012  . Dyslipidemia    LDL elevation,Patient does not want statin  . Family history of breast cancer   . GERD (gastroesophageal reflux disease)   . H/O shortness of breath    CPX 10/18:  No evidence of cardiopulmonary limitation. Suspect exercise intolerance related to weight and deconditioning.   [name] Hypothyroidism   . ICD (implantable cardiac defibrillator) battery depletion    Dr. [name], June, 2009(artifactual atrial tachycardia response events addressed by reprogramming in parentheses  . Leg swelling    right leg  . Nonischemic cardiomyopathy ([place])    Etiology unknown, diagnosed 2008, patient has never had catheterization or nuclear study as of March, 2011  . Personal history of radiation therapy 2017  . TIA (transient ischemic attack)    No CT or MRI abnormality, aspirin therapy    Past Surgical History:  Procedure Laterality Date  . BREAST BIOPSY Left 02/24/2016   U/S Core  . BREAST BIOPSY Left 02/24/2016   U/S Core  . BREAST LUMPECTOMY Left 04/05/2016   invasive ductal   . BREAST LUMPECTOMY WITH RADIOACTIVE  SEED AND SENTINEL LYMPH NODE BIOPSY Left 04/05/2016   Procedure: BREAST LUMPECTOMY WITH RADIOACTIVE SEED AND SENTINEL LYMPH NODE BIOPSY;  Surgeon: [name], MD;  Location: [facility];  Service: General;  Laterality: Left;  . CARDIAC CATHETERIZATION N/A 08/05/2016   Procedure: Left Heart Cath and Coronary Angiography;  Surgeon: Peter M Martinique, MD;  Location: [facility];  Service: Cardiovascular;  Laterality: N/A;  . CARDIAC DEFIBRILLATOR PLACEMENT  09   [employer] no remote  . COLONOSCOPY    . CYST EXCISION Left    back of leg-lt   . IMPLANTABLE CARDIOVERTER DEFIBRILLATOR GENERATOR CHANGE N/A 12/29/2014   Procedure: IMPLANTABLE CARDIOVERTER DEFIBRILLATOR GENERATOR CHANGE;  Surgeon: [name], MD;  Location: [facility] CATH [facility];  Service: Cardiovascular;  Laterality: N/A;  . LATERAL EPICONDYLE RELEASE  10/11/2012   Procedure: TENNIS ELBOW RELEASE;  Surgeon: [name], MD;  Location: [facility];  Service: Orthopedics;  Laterality: Right;  RIGHT ELBOW: TENOTOMY ELBOW LATERAL EPICONDYLITIS TENNIS ELBOW: RADIAL TUNNEL RELEASE  . PORT-A-CATH REMOVAL  04/13/2016   Procedure: MINOR REMOVAL PORT-A-CATH;  Surgeon: [name], MD;  Location: [facility];  Service: General;;  . PORTACATH PLACEMENT Right 04/05/2016   Procedure: INSERTION PORT-A-CATH WITH [place];  Surgeon: [name], MD;  Location: [facility];  Service: General;  Laterality: Right;  right IJ  . TOTAL THYROIDECTOMY  2001  . TUBAL LIGATION  94  . UPPER GI ENDOSCOPY      Social History   Socioeconomic History  . Marital status: Widowed    Spouse name: Not on file  . Number of children: Not on file  . Years of education: Not on file  . Highest education level: Not on file  Occupational History  . 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 Financial resource strain: Not on file  . Food insecurity    Worry: Not on file    Inability: Not on file  . Transportation needs    Medical: Not on file    Non-medical: Not on file  Tobacco Use  . Smoking status: Never Smoker  . Smokeless tobacco: Never Used  Substance and Sexual Activity  . Alcohol use: No  . Drug use: No  . Sexual activity: Not on file  Lifestyle  . Physical activity    Days per week: Not on file    Minutes per session: Not on file  . Stress: Not on file  Relationships  . Social  Herbalist on phone: Not on file    Gets together: Not on file    Attends religious service: Not on file    Active member of club or organization: Not on file    Attends meetings of clubs or organizations: Not on file    Relationship status: Not on file  . Intimate partner violence    Fear of current or ex partner: Not on file    Emotionally abused: Not on file    Physically abused: Not on file    Forced sexual activity: Not on file  Other Topics Concern  . Not on file  Social History Narrative  . Not on file    Outpatient Encounter Medications as of 09/05/2019  Medication Sig  . aspirin EC 81 MG tablet Take 1 tablet (81 mg total) by mouth daily.  . carvedilol (COREG) 6.25 MG tablet TAKE 1 TABLET BY MOUTH TWO  TIMES DAILY  . dicyclomine (BENTYL) 10 MG capsule Take 10 mg by  mouth as directed.  [name] ENTRESTO 24-26 MG Take 1 tablet by mouth twice daily  . fluticasone (FLONASE) 50 MCG/ACT nasal spray Place 2 sprays into both nostrils daily.  . furosemide (LASIX) 20 MG tablet Take 1 tablet by mouth once daily  . levothyroxine (SYNTHROID) 88 MCG tablet TAKE 1 TABLET BY MOUTH  DAILY  . omeprazole (PRILOSEC) 40 MG capsule Take 40 mg by mouth daily.  . traMADol (ULTRAM) 50 MG tablet Take 50 mg by mouth daily as needed.  . [DISCONTINUED] gabapentin (NEURONTIN) 100 MG capsule Take 1-3 capsules (100-300 mg total) by mouth at bedtime. (Patient not taking: Reported on 07/15/2019)   No facility-administered encounter medications on file as of 09/05/2019.     Allergies  Allergen Reactions  . Avelox [Moxifloxacin Hcl In Nacl] Shortness Of Breath    Shortness of breath  . Clindamycin/Lincomycin Shortness Of Breath and Diarrhea    Breathing/diarrhea    . Kiwi Extract Anaphylaxis    Makes pt. Feel like her throat is closing   . Bactrim [Sulfamethoxazole-Trimethoprim] Swelling  . Levofloxacin Other (See Comments)    JOINT PAIN JOINT PAIN  . Moxifloxacin Other (See Comments)     Breathing problems Breathing problems  . Spironolactone Other (See Comments)    burning  . Sulfa Antibiotics Swelling    Review of Systems  Constitutional: Positive for activity change, appetite change, chills and fatigue. Negative for diaphoresis, fever and unexpected weight change.  HENT: Positive for congestion, postnasal drip, sinus pressure and sore throat. Negative for dental problem, drooling, ear discharge, ear pain, facial swelling, hearing loss, mouth sores, nosebleeds, rhinorrhea, sinus pain, sneezing, tinnitus, trouble swallowing and voice change.   Eyes: Negative.  Negative for photophobia and visual disturbance.  Respiratory: Positive for cough. Negative for chest tightness and shortness of breath.   Cardiovascular: Negative for chest pain, palpitations and leg swelling.  Gastrointestinal: Negative for abdominal distention, abdominal pain, anal bleeding, blood in stool, constipation, diarrhea, nausea, rectal pain and vomiting.  Endocrine: Negative.  Negative for cold intolerance, heat intolerance, polydipsia, polyphagia and polyuria.  Genitourinary: Negative for decreased urine volume, difficulty urinating, dysuria, frequency and urgency.  Musculoskeletal: Positive for myalgias. Negative for arthralgias, back pain, gait problem, joint swelling, neck pain and neck stiffness.  Skin: Negative.  Negative for color change and rash.  Allergic/Immunologic: Negative.   Neurological: Positive for headaches. Negative for dizziness, tremors, seizures, syncope, facial asymmetry, speech difficulty, weakness, light-headedness and numbness.  Hematological: Negative.   Psychiatric/Behavioral: Negative for agitation, confusion, hallucinations, sleep disturbance and suicidal ideas.  All other systems reviewed and are negative.        Observations/Objective: No vital signs or physical exam, this was a telephone or virtual health encounter.  Pt alert and oriented, answers all questions  appropriately, and able to speak in full sentences.    Assessment and Plan: [name] [name] was seen today for uri.  Diagnoses and all orders for this visit:  URI with cough and congestion Suspected 2019 novel coronavirus infection Reported symptoms concerning for COVID-19 infection. Pt was tested yesterday and is awaiting results. Long discussion pertaining to symptomatic care, sheltering in place, and prevention of infection spread. Pt was provided information via MyChart. Pt aware if symptoms worsen or persist to follow up. Pt aware of symptoms that require emergent evaluation or reevaluation.     Follow Up Instructions: Return if symptoms worsen or fail to improve.    I discussed the assessment and treatment plan with the patient. The patient was provided  an opportunity to ask questions and all were answered. The patient agreed with the plan and demonstrated an understanding of the instructions.   The patient was advised to call back or seek an in-person evaluation if the symptoms worsen or if the condition fails to improve as anticipated.  The above assessment and management plan was discussed with the patient. The patient verbalized understanding of and has agreed to the management plan. Patient is aware to call the clinic if they develop any new symptoms or if symptoms persist or worsen. Patient is aware when to return to the clinic for a follow-up visit. Patient educated on when it is appropriate to go to the emergency department.    I provided 15 minutes of non-face-to-face time during this encounter. The call started at 0755. The call ended at 0810. The other time was used for coordination of care.    Monia Pouch, FNP-C [facility] [address] [place], [place] [address] [phone] 09/05/2019

## 2019-09-06 LAB — NOVEL CORONAVIRUS, NAA: SARS-CoV-2, NAA: NOT DETECTED

## 2019-09-08 DIAGNOSIS — R509 Fever, unspecified: Secondary | ICD-10-CM | POA: Diagnosis not present

## 2019-09-08 DIAGNOSIS — R05 Cough: Secondary | ICD-10-CM | POA: Diagnosis not present

## 2019-09-08 DIAGNOSIS — Z6832 Body mass index (BMI) 32.0-32.9, adult: Secondary | ICD-10-CM | POA: Diagnosis not present

## 2019-09-08 DIAGNOSIS — J069 Acute upper respiratory infection, unspecified: Secondary | ICD-10-CM | POA: Diagnosis not present

## 2019-09-09 ENCOUNTER — Telehealth: Payer: Self-pay | Admitting: Cardiology

## 2019-09-09 NOTE — Telephone Encounter (Signed)
Spoke with the pt and she is calling in to schedule an appt today or tomorrow with Dr. [name] or an APP.  Pt states last Friday she developed symptoms of SOB, feeling fatigued, and felt pre-syncopal.  Pt states she went and got COVID, flu, and strep tested, and all came back negative.  Pt states that Urgent Care said her symptoms are more than likely cardiac related, and advised her to contact our office to schedule an appt.  Pt states she is currently at home, and was taken out of work until she see's Cardiology.  Scheduled the pt at our next available appt here in the office for tomorrow 11/17 at 0900 with Vin Bhagat PA-C.  Informed the pt that Dr. [name] is out of the office today and tomorrow.  Advised the pt that if her symptoms worsen between now and her appt tomorrow, she should seek immediate medical care, or report to [facility] ER.  Pt verbalized understanding and agrees with this plan.  Will send this message to Dr. [name] as a general FYI, to make her aware of plan.

## 2019-09-09 NOTE — Telephone Encounter (Signed)
° ° °  Pt c/o Shortness Of Breath: STAT if SOB developed within the last 24 hours or pt is noticeably SOB on the phone  1. Are you currently SOB (can you hear that pt is SOB on the phone)? no  2. How long have you been experiencing SOB? Since Tuesday night  3. Are you SOB when sitting or when up moving around? With movement  4. Are you currently experiencing any other symptoms? Fatigue, dizziness.  Patient reports that she started feeling SOB on Tuesday. She got sick and had to leave work Tuesday night and Friday night. The patient got tested for COVID and the Flu, and both tests came back negative. She went to Urgent Care yesterday and was told to get in touch with her Cardiologist because the SOB may be heart related. She has a release from work that will go until tomorrow, but she would like to be seen by Dr. [name] or someone on her care team for more evaluation

## 2019-09-10 ENCOUNTER — Ambulatory Visit
Admission: RE | Admit: 2019-09-10 | Discharge: 2019-09-10 | Disposition: A | Discharge status: Home / Self Care | Payer: BC Managed Care – PPO | Source: Ambulatory Visit | Attending: Nurse Practitioner | Admitting: Nurse Practitioner

## 2019-09-10 ENCOUNTER — Other Ambulatory Visit: Payer: Self-pay

## 2019-09-10 ENCOUNTER — Ambulatory Visit: Payer: BC Managed Care – PPO | Admitting: Physician Assistant

## 2019-09-10 ENCOUNTER — Ambulatory Visit: Payer: BC Managed Care – PPO | Admitting: Nurse Practitioner

## 2019-09-10 ENCOUNTER — Encounter: Payer: Self-pay | Admitting: Nurse Practitioner

## 2019-09-10 VITALS — BP 118/80 | HR 66 | Temp 98.5°F | Ht 68.0 in | Wt 211.4 lb

## 2019-09-10 DIAGNOSIS — I428 Other cardiomyopathies: Secondary | ICD-10-CM

## 2019-09-10 DIAGNOSIS — I5022 Chronic systolic (congestive) heart failure: Secondary | ICD-10-CM

## 2019-09-10 DIAGNOSIS — Z95810 Presence of automatic (implantable) cardiac defibrillator: Secondary | ICD-10-CM

## 2019-09-10 DIAGNOSIS — R531 Weakness: Secondary | ICD-10-CM | POA: Diagnosis not present

## 2019-09-10 DIAGNOSIS — R0602 Shortness of breath: Secondary | ICD-10-CM | POA: Diagnosis not present

## 2019-09-10 DIAGNOSIS — M7989 Other specified soft tissue disorders: Secondary | ICD-10-CM | POA: Diagnosis not present

## 2019-09-10 DIAGNOSIS — I429 Cardiomyopathy, unspecified: Secondary | ICD-10-CM | POA: Diagnosis not present

## 2019-09-10 DIAGNOSIS — Z9189 Other specified personal risk factors, not elsewhere classified: Secondary | ICD-10-CM

## 2019-09-10 MED ORDER — CARVEDILOL 6.25 MG PO TABS: 3.1250 mg | ORAL_TABLET | Freq: Two times a day (BID) | ORAL | 2 refills | Status: DC

## 2019-09-10 NOTE — Progress Notes (Signed)
CARDIOLOGY OFFICE NOTE  Date:  09/10/2019    [name] [name] Date of Birth: [birth year] Medical Record [id number]  PCP:  Terald Sleeper, PA-C  Cardiologist:  [name]  Chief Complaint  Patient presents with  . Cardiomyopathy  . Shortness of Breath    Seen for Dr. [name]    History of Present Illness: [name] [name] is a 59 y.o. female who presents today for a work in visit. Seen for Dr. [name].   She has a history of chronic systolic HF and NICM. She has an ICD for primary prevention, followed by Dr. [name]. Also with h/o hypothyroidism, TIA, breast cancer w/ lumpectomy (declined chemo) and multiple medication intolerances (Spironolactone caused body burning sensation, could not afford Bidil and had fatigue with high does of Entresto).  Echo done 01/2018 showed EF at 30-35%. No significant change from previous.   Last seen by Dr. [name] back in September - was doing ok.   Phone call earlier this week - "Spoke with the pt and she is calling in to schedule an appt today or tomorrow with Dr. [name] or an APP.  Pt states last Friday she developed symptoms of SOB, feeling fatigued, and felt pre-syncopal. Pt states she went and got COVID, flu, and strep tested, and all came back negative. Pt states that Urgent Care said her symptoms are more than likely cardiac related, and advised her to contact our office to schedule an appt. Pt states she is currently at home, and was taken out of work until she see's Cardiology. Scheduled the pt at our next available appt here in the office for tomorrow 11/17 at 0900 with Vin Bhagat PA-C. Informed the pt that Dr. [name] is out of the office today and tomorrow. Advised the pt that if her symptoms worsen between now and her appt tomorrow, she should seek immediate medical care, or report to [facility] ER.  Pt verbalized understanding and agrees with this plan. Will send this message to Dr. [name] as a general FYI, to make her aware of plan."  Thus added to my  schedule for today.    The patient does not have symptoms concerning for COVID-19 infection (fever, chills, cough, or new shortness of breath).   Comes in today. Here alone. She does not feel good. She works a pretty physical job. Has felt fine since her last visit here in September up until last Tuesday - she had onset of extreme weakness and fatigue - she went to get a COVID test - this was negative. She has continued to not feel well. Nauseated at times. Short of breath. No real cough. No fever. Notes more DOE. Worse at night. Weight is actually down. No swelling noted. Does not feel bloated. Has no appetite. She has been to Urgent Care - flu was negative and strep negative. No labs or CXR done - was told to come here thinking it was her heart. She notes she has never felt this bad.    Past Medical History:  Diagnosis Date  . AICD (automatic cardioverter/defibrillator) present 09   [employer]- replaced 2016  . Arthritis   . Bradycardia   . Breast cancer ([place]) 02/24/2016   s/p lumpectomy and radiation but refused chemotherapy  . Chronic systolic CHF (congestive heart failure) ([place])   . Cough due to angiotensin-converting enzyme inhibitor    But patient chooses to continue ACE  . Dizziness    Dizziness with standing after squatting, March, 2012  . Dyslipidemia  LDL elevation,Patient does not want statin  . Family history of breast cancer   . GERD (gastroesophageal reflux disease)   . H/O shortness of breath    CPX 10/18:  No evidence of cardiopulmonary limitation. Suspect exercise intolerance related to weight and deconditioning.   [name] Hypothyroidism   . ICD (implantable cardiac defibrillator) battery depletion    Dr. [name], June, 2009(artifactual atrial tachycardia response events addressed by reprogramming in parentheses  . Leg swelling    right leg  . Nonischemic cardiomyopathy ([place])    Etiology unknown, diagnosed 2008, patient has never had catheterization or nuclear study  as of March, 2011  . Personal history of radiation therapy 2017  . TIA (transient ischemic attack)    No CT or MRI abnormality, aspirin therapy    Past Surgical History:  Procedure Laterality Date  . BREAST BIOPSY Left 02/24/2016   U/S Core  . BREAST BIOPSY Left 02/24/2016   U/S Core  . BREAST LUMPECTOMY Left 04/05/2016   invasive ductal   . BREAST LUMPECTOMY WITH RADIOACTIVE SEED AND SENTINEL LYMPH NODE BIOPSY Left 04/05/2016   Procedure: BREAST LUMPECTOMY WITH RADIOACTIVE SEED AND SENTINEL LYMPH NODE BIOPSY;  Surgeon: [name], MD;  Location: [facility];  Service: General;  Laterality: Left;  . CARDIAC CATHETERIZATION N/A 08/05/2016   Procedure: Left Heart Cath and Coronary Angiography;  Surgeon: Peter M Martinique, MD;  Location: [facility] CV LAB;  Service: Cardiovascular;  Laterality: N/A;  . CARDIAC DEFIBRILLATOR PLACEMENT  09   [employer] no remote  . COLONOSCOPY    . CYST EXCISION Left    back of leg-lt   . IMPLANTABLE CARDIOVERTER DEFIBRILLATOR GENERATOR CHANGE N/A 12/29/2014   Procedure: IMPLANTABLE CARDIOVERTER DEFIBRILLATOR GENERATOR CHANGE;  Surgeon: [name], MD;  Location: [facility] CATH LAB;  Service: Cardiovascular;  Laterality: N/A;  . LATERAL EPICONDYLE RELEASE  10/11/2012   Procedure: TENNIS ELBOW RELEASE;  Surgeon: [name], MD;  Location: [facility];  Service: Orthopedics;  Laterality: Right;  RIGHT ELBOW: TENOTOMY ELBOW LATERAL EPICONDYLITIS TENNIS ELBOW: RADIAL TUNNEL RELEASE  . PORT-A-CATH REMOVAL  04/13/2016   Procedure: MINOR REMOVAL PORT-A-CATH;  Surgeon: [name], MD;  Location: [facility];  Service: General;;  . PORTACATH PLACEMENT Right 04/05/2016   Procedure: INSERTION PORT-A-CATH WITH [place];  Surgeon: [name], MD;  Location: [facility];  Service: General;  Laterality: Right;  right IJ  . TOTAL THYROIDECTOMY  2001  . TUBAL LIGATION  94  . UPPER GI ENDOSCOPY       Medications: Current Meds  Medication Sig  .  aspirin EC 81 MG tablet Take 1 tablet (81 mg total) by mouth daily.  . carvedilol (COREG) 6.25 MG tablet Take 0.5 tablets (3.125 mg total) by mouth 2 (two) times daily.  [name] dicyclomine (BENTYL) 10 MG capsule Take 10 mg by mouth as directed.  [name] ENTRESTO 24-26 MG Take 1 tablet by mouth twice daily  . fluticasone (FLONASE) 50 MCG/ACT nasal spray Place 2 sprays into both nostrils daily.  . furosemide (LASIX) 20 MG tablet Take 1 tablet by mouth once daily  . levothyroxine (SYNTHROID) 88 MCG tablet TAKE 1 TABLET BY MOUTH  DAILY  . omeprazole (PRILOSEC) 40 MG capsule Take 40 mg by mouth daily.  . traMADol (ULTRAM) 50 MG tablet Take 50 mg by mouth daily as needed.  . [DISCONTINUED] carvedilol (COREG) 6.25 MG tablet TAKE 1 TABLET BY MOUTH TWO  TIMES DAILY     Allergies: Allergies  Allergen  Reactions  . Avelox [Moxifloxacin Hcl In Nacl] Shortness Of Breath    Shortness of breath  . Clindamycin/Lincomycin Shortness Of Breath and Diarrhea    Breathing/diarrhea    . Kiwi Extract Anaphylaxis    Makes pt. Feel like her throat is closing   . Bactrim [Sulfamethoxazole-Trimethoprim] Swelling  . Levofloxacin Other (See Comments)    JOINT PAIN JOINT PAIN  . Moxifloxacin Other (See Comments)    Breathing problems Breathing problems  . Spironolactone Other (See Comments)    burning  . Sulfa Antibiotics Swelling    Social History: The patient  reports that she has never smoked. She has never used smokeless tobacco. She reports that she does not drink alcohol or use drugs.   Family History: The patient's family history includes Breast cancer in her maternal aunt; Breast cancer (age of onset: 4) in her mother; Cancer in her maternal aunt, mother, and paternal grandmother; Heart attack in her mother; Hypertension in her brother and sister.   Review of Systems: Please see the history of present illness.   All other systems are reviewed and negative.   Physical Exam: VS:  BP 118/80   Pulse 66    Ht 5\' 8"  (1.727 m)   Wt 211 lb 6.4 oz (95.9 kg)   SpO2 90%   BMI 32.14 kg/m  .  BMI Body mass index is 32.14 kg/m.  Wt Readings from Last 3 Encounters:  09/10/19 211 lb 6.4 oz (95.9 kg)  07/15/19 219 lb (99.3 kg)  07/12/19 216 lb (98 kg)    General: Alert and in no acute distress. Her weight is down 8 pounds since last visit. She looks fatigued.  HEENT: Normal.  Neck: Supple, no JVD, carotid bruits, or masses noted.  Cardiac: Regular rate and rhythm. No murmurs, rubs, or gallops. No edema.  Respiratory:  Lungs are clear to auscultation bilaterally with normal work of breathing.  GI: Soft and nontender.  MS: No deformity or atrophy. Gait and ROM intact.  Skin: Warm and dry. Color is normal.  Neuro:  Strength and sensation are intact and no gross focal deficits noted.  Psych: Alert, appropriate and with normal affect.   LABORATORY DATA:  EKG:  EKG is ordered today. This demonstrates NSR.  Lab Results  Component Value Date   WBC 4.4 07/12/2019   HGB 11.3 07/12/2019   HCT 34.0 07/12/2019   PLT 250 07/12/2019   GLUCOSE 82 05/15/2019   CHOL 207 (H) 05/15/2019   TRIG 81 05/15/2019   HDL 64 05/15/2019   LDLCALC 127 (H) 05/15/2019   ALT 20 05/15/2019   AST 19 05/15/2019   NA 145 (H) 05/15/2019   K 4.2 05/15/2019   CL 109 (H) 05/15/2019   CREATININE 0.87 05/15/2019   BUN 17 05/15/2019   CO2 23 05/15/2019   TSH 1.020 05/15/2019   INR 1.0 08/02/2016   HGBA1C 5.8 07/12/2019     BNP (last 3 results) No results for input(s): BNP in the last 8760 hours.  ProBNP (last 3 results) Recent Labs    05/15/19 1034  PROBNP 154     Other Studies Reviewed Today:  TTE: 05/15/2019 - 1. The left ventricle has severely reduced systolic function, with an ejection fraction of 25-30%. The cavity size was mildly dilated. Left ventricular diastolic Doppler parameters are consistent with impaired relaxation. Left ventrical global  hypokinesis without regional wall motion  abnormalities. 2. The right ventricle has normal systolic function. The cavity was mildly enlarged. There is no  increase in right ventricular wall thickness. Right ventricular systolic pressure is normal with an estimated pressure of 27.4 mmHg. 3. Left atrial size was mildly dilated. 4. The mitral valve is myxomatous. Mild thickening of the mitral valve leaflet. Mitral valve regurgitation is mild by color flow Doppler. The MR jet is posteriorly-directed. 5. The aorta is normal in size and structure. 6. When compared to the prior study: Compared to April 2019, LV global longitudinal strain appears unchanged. 7. The average left ventricular global longitudinal strain is -16.3 %.   Left Heart Cath and Coronary Angiography 2017  Conclusion    There is severe left ventricular systolic dysfunction.  LV end diastolic pressure is normal.  The left ventricular ejection fraction is 25-35% by visual estimate.   1. Normal coronary anatomy 2. Severe LV dysfunction 3. Elevated LV EDP  Plan: medical management.      Assessment/Plan:  1.  Weakness and fatigue with DOE/shortness of breath/presyncope - her weight is down - no swelling on exam - does not look to have acute heart failure - will check lab, include BNP, arrange for CXR and I have asked her to have repeat COVID testing tomorrow (wonder if she was tested too early??). Will keep out of work until seen back. BP just a little soft - will cut Coreg back and hold Lasix for now. Further disposition to follow. She is on aspirin.   2. NICM - has just had echo from July - EF is 25 to 30% - she has ICD in place - on Entresto and low dose beta blocker.   3. HLD - not discussed  4. Underlying ICD - no shocks  5. HTN - see above - cutting Coreg back today.   6. COVID-19 Education: The signs and symptoms of COVID-19 were discussed with the patient and how to seek care for testing (follow up with PCP or arrange E-visit).  The importance  of social distancing, staying at home, hand hygiene and wearing a mask when out in public were discussed today.  Current medicines are reviewed with the patient today.  The patient does not have concerns regarding medicines other than what has been noted above.  The following changes have been made:  See above.  Labs/ tests ordered today include:    Orders Placed This Encounter  Procedures  . DG Chest 2 View  . Basic metabolic panel  . CBC no Diff  . Hepatic function panel  . Pro b natriuretic peptide  . EKG 12/Charge capture     Disposition:   FU with me in one week. Note given to remain out of work until seen back.   Patient is agreeable to this plan and will call if any problems develop in the interim.   SignedTruitt Merle, NP  09/10/2019 3:59 PM  [facility] [address] [place], [place]  [address] Phone: [phone] Fax: [phone]

## 2019-09-10 NOTE — Patient Instructions (Addendum)
After Visit Summary:  We will be checking the following labs today - BMET, CBC, HPF and BNP  Please go to [facility] to [place] on the first floor for a chest Xray - you may walk in.    Repeat your COVID test tomorrow at [facility] - between 9AM to 4PM   Medication Instructions:    Continue with your current medicines. BUT  Decrease the Coreg to 1/2 a tablet twice a day  Hold the Lasix for now   If you need a refill on your cardiac medications before your next appointment, please call your pharmacy.     Testing/Procedures To Be Arranged:  N/A  Follow-Up:   See [place] back in a week    At [facility], you and your health needs are our priority.  As part of our continuing mission to provide you with exceptional heart care, we have created designated Provider Care Teams.  These Care Teams include your primary Cardiologist (physician) and Advanced Practice Providers (APPs -  Physician Assistants and Nurse Practitioners) who all work together to provide you with the care you need, when you need it.  Special Instructions:  . Stay safe, stay home, wash your hands for at least 20 seconds and wear a mask when out in public.  . It was good to talk with you today.  . Not to return to work until seen back in follow up.    Call the [place] office at [phone] if you have any questions, problems or concerns.

## 2019-09-11 ENCOUNTER — Other Ambulatory Visit: Payer: Self-pay

## 2019-09-11 DIAGNOSIS — Z20822 Contact with and (suspected) exposure to covid-19: Secondary | ICD-10-CM

## 2019-09-11 LAB — HEPATIC FUNCTION PANEL
ALT: 10 IU/L (ref 0–32)
AST: 14 IU/L (ref 0–40)
Albumin: 3.9 g/dL (ref 3.8–4.9)
Alkaline Phosphatase: 60 IU/L (ref 39–117)
Bilirubin Total: <0.2 mg/dL (ref 0.0–1.2)
Bilirubin, Direct: 0.07 mg/dL (ref 0.00–0.40)
Total Protein: 6.6 g/dL (ref 6.0–8.5)

## 2019-09-11 LAB — CBC
Hematocrit: 35.5 % (ref 34.0–46.6)
Hemoglobin: 12 g/dL (ref 11.1–15.9)
MCH: 30.3 pg (ref 26.6–33.0)
MCHC: 33.8 g/dL (ref 31.5–35.7)
MCV: 90 fL (ref 79–97)
Platelets: 189 10*3/uL (ref 150–450)
RBC: 3.96 x10E6/uL (ref 3.77–5.28)
RDW: 12.7 % (ref 11.7–15.4)
WBC: 6.2 10*3/uL (ref 3.4–10.8)

## 2019-09-11 LAB — BASIC METABOLIC PANEL
BUN/Creatinine Ratio: 19 (ref 9–23)
BUN: 21 mg/dL (ref 6–24)
CO2: 26 mmol/L (ref 20–29)
Calcium: 9.2 mg/dL (ref 8.7–10.2)
Chloride: 104 mmol/L (ref 96–106)
Creatinine, Ser: 1.08 mg/dL — ABNORMAL HIGH (ref 0.57–1.00)
GFR calc Af Amer: 65 mL/min/{1.73_m2} (ref >59)
GFR calc non Af Amer: 56 mL/min/{1.73_m2} — ABNORMAL LOW (ref >59)
Glucose: 90 mg/dL (ref 65–99)
Potassium: 4.7 mmol/L (ref 3.5–5.2)
Sodium: 141 mmol/L (ref 134–144)

## 2019-09-11 LAB — PRO B NATRIURETIC PEPTIDE: NT-Pro BNP: 189 pg/mL (ref 0–287)

## 2019-09-11 NOTE — Addendum Note (Signed)
Addended by: Burtis Junes on: 09/11/2019 10:20 AM   Modules accepted: Orders

## 2019-09-13 ENCOUNTER — Telehealth: Payer: Self-pay

## 2019-09-13 ENCOUNTER — Telehealth: Payer: Self-pay | Admitting: Emergency Medicine

## 2019-09-13 LAB — NOVEL CORONAVIRUS, NAA: SARS-CoV-2, NAA: NOT DETECTED

## 2019-09-13 LAB — SPECIMEN STATUS REPORT

## 2019-09-13 NOTE — Telephone Encounter (Signed)
LMOVM for pt to call about sending a transmission with her home monitor.

## 2019-09-13 NOTE — Telephone Encounter (Signed)
Lpm on home phone with Lori's recommendation.  I also lm on work phone.  Take extra Lasix 20 mg Friday, Saturday, Sunday.

## 2019-09-13 NOTE — Telephone Encounter (Signed)
I spoke to the patient and gave her lab results and CoVid testing result.  She said that last night she was nauseated and "heart was racing", but better today.   She is still experiencing SOB with exertion and was reminded of appt on 11/24.

## 2019-09-13 NOTE — Telephone Encounter (Signed)
Noted - please call her back - let's have her take an extra dose of her Lasix today, Saturday and Sunday.   Will see back as planned.   Burtis Junes, RN, [place] [address] [place], [place]  [address] [phone]

## 2019-09-13 NOTE — Telephone Encounter (Signed)
-----   Message from Burtis Junes, NP sent at 09/13/2019  7:02 AM EST ----- Can someone please touch base with her today - her COVID test came back negative.   I would like to know how she is feeling.   Thanks, [name]

## 2019-09-13 NOTE — Telephone Encounter (Signed)
-----   Message from Burtis Junes, NP sent at 09/13/2019 11:14 AM EST ----- Good morning,   I wanted to see if if was possible to check Adahlia's device.   Please see my last office note. She does not have COVID as we suspected but had low oxygen sat and now notes heart racing and persistent fatigue.   I'd like to know that her device is ok.   Thanks, [name]

## 2019-09-13 NOTE — Telephone Encounter (Signed)
LMOM  for  patient  to send manual transmission. Patient currently at work.

## 2019-09-13 NOTE — Progress Notes (Addendum)
CARDIOLOGY OFFICE NOTE  Date:  09/17/2019    [name] [name] Date of Birth: 11/16/1960 Medical Record [id number]  PCP:  Terald Sleeper, PA-C  Cardiologist:  [name]  Chief Complaint  Patient presents with  . Follow-up    History of Present Illness: [name] [name] is a 58 y.o. female who presents today for a one week check.  Seen for Dr. [name].   She has a history of chronic systolic HF and NICM. She has an ICD for primary prevention, followed by Dr. [name]. Also with h/o hypothyroidism, TIA, breast cancer w/ lumpectomy (declined chemo) and multiple medication intolerances (Spironolactone caused body burning sensation, could not afford Bidil and had fatigue with high does of Entresto).  Echo done 01/2018 showed EF at 30-35%. No significant change from previous.   Last seen by Dr. [name] back in September - was doing ok.   Phone call earlier this month - she was not feeling well - had been tested for COVID, flu and strep -  All negative. I ended up seeing her - she looked like she did not feel good. Very vague. Weight was down. No swelling.  She was not able to work. Was having more DOE. I was worried that she did have COVID - her sats were only 90% - we sent her for a CXR and checked labs.   The patient does not have symptoms concerning for COVID-19 infection (fever, chills, cough, or new shortness of breath).   Comes in today. Here alone. She does not feel well still. Weight is down. No swelling. More shortness of breath - with exertion. She feels a pressure sensation in her chest when she is short of breath. No real cough. Extreme fatigue. Some nausea at times. The extra Lasix has not helped. She is not able to work due to the extreme fatigue and dyspnea - has no energy.   Her device was checked a few days ago - this was basically ok.   Past Medical History:  Diagnosis Date  . AICD (automatic cardioverter/defibrillator) present 09   [employer]- replaced 2016  . Arthritis    . Bradycardia   . Breast cancer ([place]) 02/24/2016   s/p lumpectomy and radiation but refused chemotherapy  . Chronic systolic CHF (congestive heart failure) ([place])   . Cough due to angiotensin-converting enzyme inhibitor    But patient chooses to continue ACE  . Dizziness    Dizziness with standing after squatting, March, 2012  . Dyslipidemia    LDL elevation,Patient does not want statin  . Family history of breast cancer   . GERD (gastroesophageal reflux disease)   . H/O shortness of breath    CPX 10/18:  No evidence of cardiopulmonary limitation. Suspect exercise intolerance related to weight and deconditioning.   [name] Hypothyroidism   . ICD (implantable cardiac defibrillator) battery depletion    Dr. [name], June, 2009(artifactual atrial tachycardia response events addressed by reprogramming in parentheses  . Leg swelling    right leg  . Nonischemic cardiomyopathy ([place])    Etiology unknown, diagnosed 2008, patient has never had catheterization or nuclear study as of March, 2011  . Personal history of radiation therapy 2017  . TIA (transient ischemic attack)    No CT or MRI abnormality, aspirin therapy    Past Surgical History:  Procedure Laterality Date  . BREAST BIOPSY Left 02/24/2016   U/S Core  . BREAST BIOPSY Left 02/24/2016   U/S Core  . BREAST LUMPECTOMY Left  04/05/2016   invasive ductal   . BREAST LUMPECTOMY WITH RADIOACTIVE SEED AND SENTINEL LYMPH NODE BIOPSY Left 04/05/2016   Procedure: BREAST LUMPECTOMY WITH RADIOACTIVE SEED AND SENTINEL LYMPH NODE BIOPSY;  Surgeon: [name], MD;  Location: [facility];  Service: General;  Laterality: Left;  . CARDIAC CATHETERIZATION N/A 08/05/2016   Procedure: Left Heart Cath and Coronary Angiography;  Surgeon: Peter M Martinique, MD;  Location: [facility];  Service: Cardiovascular;  Laterality: N/A;  . CARDIAC DEFIBRILLATOR PLACEMENT  09   [employer] no remote  . COLONOSCOPY    . CYST EXCISION Left    back of leg-lt   .  IMPLANTABLE CARDIOVERTER DEFIBRILLATOR GENERATOR CHANGE N/A 12/29/2014   Procedure: IMPLANTABLE CARDIOVERTER DEFIBRILLATOR GENERATOR CHANGE;  Surgeon: [name], MD;  Location: [facility];  Service: Cardiovascular;  Laterality: N/A;  . LATERAL EPICONDYLE RELEASE  10/11/2012   Procedure: TENNIS ELBOW RELEASE;  Surgeon: [name], MD;  Location: [facility];  Service: Orthopedics;  Laterality: Right;  RIGHT ELBOW: TENOTOMY ELBOW LATERAL EPICONDYLITIS TENNIS ELBOW: RADIAL TUNNEL RELEASE  . PORT-A-CATH REMOVAL  04/13/2016   Procedure: MINOR REMOVAL PORT-A-CATH;  Surgeon: [name], MD;  Location: [facility];  Service: General;;  . PORTACATH PLACEMENT Right 04/05/2016   Procedure: INSERTION PORT-A-CATH WITH [place];  Surgeon: [name], MD;  Location: [facility];  Service: General;  Laterality: Right;  right IJ  . TOTAL THYROIDECTOMY  2001  . TUBAL LIGATION  94  . UPPER GI ENDOSCOPY       Medications: Current Meds  Medication Sig  . aspirin EC 81 MG tablet Take 1 tablet (81 mg total) by mouth daily.  . carvedilol (COREG) 6.25 MG tablet Take 0.5 tablets (3.125 mg total) by mouth 2 (two) times daily.  [name] dicyclomine (BENTYL) 10 MG capsule Take 10 mg by mouth as directed.  [name] ENTRESTO 24-26 MG Take 1 tablet by mouth twice daily  . fluticasone (FLONASE) 50 MCG/ACT nasal spray Place 2 sprays into both nostrils daily.  . furosemide (LASIX) 20 MG tablet Take 1 tablet by mouth once daily  . levothyroxine (SYNTHROID) 88 MCG tablet TAKE 1 TABLET BY MOUTH  DAILY  . omeprazole (PRILOSEC) 40 MG capsule Take 40 mg by mouth daily.  . traMADol (ULTRAM) 50 MG tablet Take 50 mg by mouth daily as needed.     Allergies: Allergies  Allergen Reactions  . Avelox [Moxifloxacin Hcl In Nacl] Shortness Of Breath    Shortness of breath  . Clindamycin/Lincomycin Shortness Of Breath and Diarrhea    Breathing/diarrhea    . Kiwi Extract Anaphylaxis    Makes pt. Feel like her throat  is closing   . Bactrim [Sulfamethoxazole-Trimethoprim] Swelling  . Levofloxacin Other (See Comments)    JOINT PAIN JOINT PAIN  . Moxifloxacin Other (See Comments)    Breathing problems Breathing problems  . Spironolactone Other (See Comments)    burning  . Sulfa Antibiotics Swelling    Social History: The patient  reports that she has never smoked. She has never used smokeless tobacco. She reports that she does not drink alcohol or use drugs.   Family History: The patient's family history includes Breast cancer in her maternal aunt; Breast cancer (age of onset: 34) in her mother; Cancer in her maternal aunt, mother, and paternal grandmother; Heart attack in her mother; Hypertension in her brother and sister.   Review of Systems: Please see the history of present illness.   All  other systems are reviewed and negative.   Physical Exam: VS:  BP 116/76   Pulse 68   Ht 5\' 8"  (1.727 m)   Wt 207 lb 12.8 oz (94.3 kg)   SpO2 98%   BMI 31.60 kg/m  .  BMI Body mass index is 31.6 kg/m.  Wt Readings from Last 3 Encounters:  09/17/19 207 lb 12.8 oz (94.3 kg)  09/10/19 211 lb 6.4 oz (95.9 kg)  07/15/19 219 lb (99.3 kg)    General: Alert and in no acute distress.  She still looks fatigued. Weight is down 4 more pounds. She has lost 12 since September.  HEENT: Normal.  Neck: Supple, no JVD, carotid bruits, or masses noted.  Cardiac: Regular rate and rhythm. No real S3 that I appreciate. No edema.  Respiratory:  Lungs are clear to auscultation bilaterally with normal work of breathing.  GI: Soft and nontender.  MS: No deformity or atrophy. Gait and ROM intact.  Skin: Warm and dry. Color is normal.  Neuro:  Strength and sensation are intact and no gross focal deficits noted.  Psych: Alert, appropriate and with normal affect.   LABORATORY DATA:  EKG:  EKG is not ordered today.   Lab Results  Component Value Date   WBC 6.2 09/10/2019   HGB 12.0 09/10/2019   HCT 35.5 09/10/2019    PLT 189 09/10/2019   GLUCOSE 90 09/10/2019   CHOL 207 (H) 05/15/2019   TRIG 81 05/15/2019   HDL 64 05/15/2019   LDLCALC 127 (H) 05/15/2019   ALT 10 09/10/2019   AST 14 09/10/2019   NA 141 09/10/2019   K 4.7 09/10/2019   CL 104 09/10/2019   CREATININE 1.08 (H) 09/10/2019   BUN 21 09/10/2019   CO2 26 09/10/2019   TSH 1.020 05/15/2019   INR 1.0 08/02/2016   HGBA1C 5.8 07/12/2019     BNP (last 3 results) No results for input(s): BNP in the last 8760 hours.  ProBNP (last 3 results) Recent Labs    05/15/19 1034 09/10/19 1602  PROBNP 154 189     Other Studies Reviewed Today:  TTE:05/15/2019 -1. The left ventricle has severely reduced systolic function, with an ejection fraction of 25-30%. The cavity size was mildly dilated. Left ventricular diastolic Doppler parameters are consistent with impaired relaxation. Left ventrical global  hypokinesis without regional wall motion abnormalities. 2. The right ventricle has normal systolic function. The cavity was mildly enlarged. There is no increase in right ventricular wall thickness. Right ventricular systolic pressure is normal with an estimated pressure of 27.4 mmHg. 3. Left atrial size was mildly dilated. 4. The mitral valve is myxomatous. Mild thickening of the mitral valve leaflet. Mitral valve regurgitation is mild by color flow Doppler. The MR jet is posteriorly-directed. 5. The aorta is normal in size and structure. 6. When compared to the prior study: Compared to April 2019, LV global longitudinal strain appears unchanged. 7. The average left ventricular global longitudinal strain is -16.3 %.   Left Heart Cath and Coronary Angiography 2017  Conclusion    There is severe left ventricular systolic dysfunction.  LV end diastolic pressure is normal.  The left ventricular ejection fraction is 25-35% by visual estimate.  1. Normal coronary anatomy 2. Severe LV dysfunction 3. Elevated LV EDP  Plan:  medical management.      Assessment/Plan:  1. Continued weakness and fatigue with DOE/shortness of breath/presyncope  - she is really no better - I worry she is developing worsening heart  failure but clinically without swelling but with progressive DOE - we are going to get a limited echo - repeat her lab - check D dimer - referring on to Advanced CHF. Further disposition to follow. Her symptoms are quite debilitating for her - she is not able to return to work - she is looking into short term disability.   2. NICM - has just had echo from July - EF is 25 to 30% - she has ICD in place - on Entresto and low dose beta blocker. She is not on ideal therapy due to side effects/intolerances - see #1.   3. HLD - not discussed  4. Underlying ICD - no shocks - recent transmission is basically ok.   5. HTN - BP is fine - I really do not want to cut anything else back.  6. Hypothyroid - rechecking lab.   7. COVID-19 Education: The signs and symptoms of COVID-19 were discussed with the patient and how to seek care for testing (follow up with PCP or arrange E-visit).  The importance of social distancing, staying at home, hand hygiene and wearing a mask when out in public were discussed today.  Current medicines are reviewed with the patient today.  The patient does not have concerns regarding medicines other than what has been noted above.  The following changes have been made:  See above.  Labs/ tests ordered today include:    Orders Placed This Encounter  Procedures  . Basic metabolic panel  . CBC with Differential  . Hepatic function panel  . TSH  . Pro b natriuretic peptide  . AMB referral to CHF clinic  . 2D ECHO LIMITED     Disposition:   Further disposition pending.   Patient is agreeable to this plan and will call if any problems develop in the interim.   SignedTruitt Merle, NP  09/17/2019 2:33 PM  [facility] [address]  [place], [place]  [address] Phone: [phone] Fax: [phone]      Addendum: We have proceeded with CT to rule out PE - this was negative. Echo has been updated as well - EF at 35 to 40%. Waiting on CHF referral and input from Dr. [name].   I have talked with [name] [name] today - 09/23/19' - she still feels bad - has intermittent spells of profound fatigue/feeling bad/short of breath. She is filing FMLA and looking into short term disability.   I have placed her out of work until October 07, 2019 as this work up continues.   Burtis Junes, RN, [facility] [address] [place], [place]  [address] [phone]

## 2019-09-13 NOTE — Telephone Encounter (Signed)
lpmtcb 11/20

## 2019-09-13 NOTE — Telephone Encounter (Signed)
Follow up:     Patient returning a call back. Patient states she is SOB when she is moving around. Please call patient back.

## 2019-09-14 LAB — C-REACTIVE PROTEIN: CRP: 3 mg/L (ref 0–10)

## 2019-09-14 LAB — SPECIMEN STATUS REPORT

## 2019-09-16 NOTE — Telephone Encounter (Signed)
lpmtcb to check if she took the advice from [place] to take the extra Lasix over the weekend. 11/23

## 2019-09-16 NOTE — Telephone Encounter (Signed)
Follow up    Nurse unavailable Please return call to patient

## 2019-09-16 NOTE — Telephone Encounter (Signed)
I spoke to the patient who is still experiencing exertional SOB and occasional "heart racing"  Even after her extra dose of Lasix, Friday, Saturday and Sunday.  She said that she is laying low until her appointment with [name] on 11/24.

## 2019-09-17 ENCOUNTER — Other Ambulatory Visit: Payer: Self-pay

## 2019-09-17 ENCOUNTER — Encounter: Payer: Self-pay | Admitting: Nurse Practitioner

## 2019-09-17 ENCOUNTER — Encounter (HOSPITAL_COMMUNITY): Payer: Self-pay | Admitting: Emergency Medicine

## 2019-09-17 ENCOUNTER — Other Ambulatory Visit: Payer: Self-pay | Admitting: Nurse Practitioner

## 2019-09-17 ENCOUNTER — Ambulatory Visit: Payer: BC Managed Care – PPO | Admitting: Nurse Practitioner

## 2019-09-17 VITALS — BP 116/76 | HR 68 | Ht 68.0 in | Wt 207.8 lb

## 2019-09-17 DIAGNOSIS — I5022 Chronic systolic (congestive) heart failure: Secondary | ICD-10-CM

## 2019-09-17 DIAGNOSIS — R0609 Other forms of dyspnea: Secondary | ICD-10-CM | POA: Diagnosis not present

## 2019-09-17 DIAGNOSIS — Z79899 Other long term (current) drug therapy: Secondary | ICD-10-CM | POA: Insufficient documentation

## 2019-09-17 DIAGNOSIS — E039 Hypothyroidism, unspecified: Secondary | ICD-10-CM | POA: Diagnosis not present

## 2019-09-17 DIAGNOSIS — R0600 Dyspnea, unspecified: Secondary | ICD-10-CM | POA: Insufficient documentation

## 2019-09-17 DIAGNOSIS — Z95810 Presence of automatic (implantable) cardiac defibrillator: Secondary | ICD-10-CM | POA: Diagnosis not present

## 2019-09-17 DIAGNOSIS — I428 Other cardiomyopathies: Secondary | ICD-10-CM | POA: Diagnosis not present

## 2019-09-17 DIAGNOSIS — R0602 Shortness of breath: Secondary | ICD-10-CM

## 2019-09-17 DIAGNOSIS — R531 Weakness: Secondary | ICD-10-CM

## 2019-09-17 DIAGNOSIS — Z7982 Long term (current) use of aspirin: Secondary | ICD-10-CM | POA: Diagnosis not present

## 2019-09-17 DIAGNOSIS — R7989 Other specified abnormal findings of blood chemistry: Secondary | ICD-10-CM

## 2019-09-17 LAB — BASIC METABOLIC PANEL
BUN/Creatinine Ratio: 14 (ref 9–23)
BUN: 15 mg/dL (ref 6–24)
CO2: 29 mmol/L (ref 20–29)
Calcium: 9.9 mg/dL (ref 8.7–10.2)
Chloride: 102 mmol/L (ref 96–106)
Creatinine, Ser: 1.08 mg/dL — ABNORMAL HIGH (ref 0.57–1.00)
GFR calc Af Amer: 65 mL/min/{1.73_m2} (ref >59)
GFR calc non Af Amer: 56 mL/min/{1.73_m2} — ABNORMAL LOW (ref >59)
Glucose: 93 mg/dL (ref 65–99)
Potassium: 4.4 mmol/L (ref 3.5–5.2)
Sodium: 138 mmol/L (ref 134–144)

## 2019-09-17 LAB — CBC WITH DIFFERENTIAL/PLATELET
Basophils Absolute: 0 10*3/uL (ref 0.0–0.2)
Basos: 0 %
EOS (ABSOLUTE): 0 10*3/uL (ref 0.0–0.4)
Eos: 1 %
Hematocrit: 35.7 % (ref 34.0–46.6)
Hemoglobin: 11.9 g/dL (ref 11.1–15.9)
Lymphocytes Absolute: 1.8 10*3/uL (ref 0.7–3.1)
Lymphs: 38 %
MCH: 29.9 pg (ref 26.6–33.0)
MCHC: 33.3 g/dL (ref 31.5–35.7)
MCV: 90 fL (ref 79–97)
Monocytes Absolute: 0.3 10*3/uL (ref 0.1–0.9)
Monocytes: 7 %
Neutrophils Absolute: 2.5 10*3/uL (ref 1.4–7.0)
Neutrophils: 54 %
Platelets: 202 10*3/uL (ref 150–450)
RBC: 3.98 x10E6/uL (ref 3.77–5.28)
RDW: 13.2 % (ref 11.7–15.4)
WBC: 4.7 10*3/uL (ref 3.4–10.8)

## 2019-09-17 LAB — HEPATIC FUNCTION PANEL
ALT: 10 IU/L (ref 0–32)
AST: 13 IU/L (ref 0–40)
Albumin: 4.1 g/dL (ref 3.8–4.9)
Alkaline Phosphatase: 61 IU/L (ref 39–117)
Bilirubin Total: 0.3 mg/dL (ref 0.0–1.2)
Bilirubin, Direct: <0.1 mg/dL (ref 0.00–0.40)
Total Protein: 6.3 g/dL (ref 6.0–8.5)

## 2019-09-17 LAB — D-DIMER, QUANTITATIVE: D-DIMER: <3.65 mg/L FEU — ABNORMAL HIGH (ref 0.00–0.49)

## 2019-09-17 NOTE — Patient Instructions (Addendum)
After Visit Summary:  We will be checking the following labs today - BMET, CBC with diff, HPF, BNP and TSh  STAT D Dimer   Medication Instructions:    Continue with your current medicines.    If you need a refill on your cardiac medications before your next appointment, please call your pharmacy.     Testing/Procedures To Be Arranged:  Limited echocardiogram   Follow-Up:   Referral to Advanced CHF clinic with Dr. Aundra Dubin or [place], you and your health needs are our priority.  As part of our continuing mission to provide you with exceptional heart care, we have created designated Provider Care Teams.  These Care Teams include your primary Cardiologist (physician) and Advanced Practice Providers (APPs -  Physician Assistants and Nurse Practitioners) who all work together to provide you with the care you need, when you need it.  Special Instructions:  . Stay safe, stay home, wash your hands for at least 20 seconds and wear a mask when out in public.  . It was good to talk with you today.    Call the [place] office at [phone] if you have any questions, problems or concerns.

## 2019-09-17 NOTE — ED Triage Notes (Signed)
Pt was sent by PCP for elevated D-dimer. Pt c/o SOB and chest pain x 2 weeks

## 2019-09-18 ENCOUNTER — Telehealth: Payer: Self-pay

## 2019-09-18 ENCOUNTER — Emergency Department (HOSPITAL_COMMUNITY)
Admission: EM | Admit: 2019-09-18 | Discharge: 2019-09-18 | Disposition: A | Discharge status: Home / Self Care | Payer: BC Managed Care – PPO | Attending: Emergency Medicine | Admitting: Emergency Medicine

## 2019-09-18 ENCOUNTER — Ambulatory Visit (HOSPITAL_BASED_OUTPATIENT_CLINIC_OR_DEPARTMENT_OTHER): Payer: BC Managed Care – PPO

## 2019-09-18 ENCOUNTER — Emergency Department (HOSPITAL_COMMUNITY): Payer: BC Managed Care – PPO

## 2019-09-18 DIAGNOSIS — Z95810 Presence of automatic (implantable) cardiac defibrillator: Secondary | ICD-10-CM

## 2019-09-18 DIAGNOSIS — R531 Weakness: Secondary | ICD-10-CM | POA: Diagnosis not present

## 2019-09-18 DIAGNOSIS — R0600 Dyspnea, unspecified: Secondary | ICD-10-CM

## 2019-09-18 DIAGNOSIS — I5022 Chronic systolic (congestive) heart failure: Secondary | ICD-10-CM

## 2019-09-18 DIAGNOSIS — R0609 Other forms of dyspnea: Secondary | ICD-10-CM

## 2019-09-18 DIAGNOSIS — R0602 Shortness of breath: Secondary | ICD-10-CM | POA: Diagnosis not present

## 2019-09-18 DIAGNOSIS — I428 Other cardiomyopathies: Secondary | ICD-10-CM

## 2019-09-18 LAB — BRAIN NATRIURETIC PEPTIDE: B Natriuretic Peptide: 37 pg/mL (ref 0.0–100.0)

## 2019-09-18 LAB — BASIC METABOLIC PANEL
Anion gap: 6 (ref 5–15)
BUN: 16 mg/dL (ref 6–20)
CO2: 28 mmol/L (ref 22–32)
Calcium: 9 mg/dL (ref 8.9–10.3)
Chloride: 105 mmol/L (ref 98–111)
Creatinine, Ser: 1.21 mg/dL — ABNORMAL HIGH (ref 0.44–1.00)
GFR calc Af Amer: 57 mL/min — ABNORMAL LOW (ref >60)
GFR calc non Af Amer: 49 mL/min — ABNORMAL LOW (ref >60)
Glucose, Bld: 102 mg/dL — ABNORMAL HIGH (ref 70–99)
Potassium: 3.8 mmol/L (ref 3.5–5.1)
Sodium: 139 mmol/L (ref 135–145)

## 2019-09-18 LAB — CBC WITH DIFFERENTIAL/PLATELET
Abs Immature Granulocytes: 0.02 10*3/uL (ref 0.00–0.07)
Basophils Absolute: 0 10*3/uL (ref 0.0–0.1)
Basophils Relative: 0 %
Eosinophils Absolute: 0.1 10*3/uL (ref 0.0–0.5)
Eosinophils Relative: 1 %
HCT: 38.3 % (ref 36.0–46.0)
Hemoglobin: 12 g/dL (ref 12.0–15.0)
Immature Granulocytes: 0 %
Lymphocytes Relative: 37 %
Lymphs Abs: 2.4 10*3/uL (ref 0.7–4.0)
MCH: 29.6 pg (ref 26.0–34.0)
MCHC: 31.3 g/dL (ref 30.0–36.0)
MCV: 94.6 fL (ref 80.0–100.0)
Monocytes Absolute: 0.4 10*3/uL (ref 0.1–1.0)
Monocytes Relative: 7 %
Neutro Abs: 3.6 10*3/uL (ref 1.7–7.7)
Neutrophils Relative %: 55 %
Platelets: 197 10*3/uL (ref 150–400)
RBC: 4.05 MIL/uL (ref 3.87–5.11)
RDW: 12.4 % (ref 11.5–15.5)
WBC: 6.5 10*3/uL (ref 4.0–10.5)
nRBC: 0 % (ref 0.0–0.2)

## 2019-09-18 LAB — TROPONIN I (HIGH SENSITIVITY)
Troponin I (High Sensitivity): 4 ng/L (ref <18)
Troponin I (High Sensitivity): 4 ng/L (ref <18)

## 2019-09-18 LAB — ECHOCARDIOGRAM LIMITED

## 2019-09-18 LAB — PRO B NATRIURETIC PEPTIDE: NT-Pro BNP: 130 pg/mL (ref 0–287)

## 2019-09-18 LAB — TSH: TSH: 1.64 u[IU]/mL (ref 0.450–4.500)

## 2019-09-18 MED ORDER — IOHEXOL 350 MG/ML SOLN
100.0000 mL | Freq: Once | INTRAVENOUS | Status: AC | PRN
  Administered 2019-09-18: 100 mL via INTRAVENOUS

## 2019-09-18 MED ORDER — PERFLUTREN LIPID MICROSPHERE
1.0000 mL | INTRAVENOUS | Status: AC | PRN
  Administered 2019-09-18: 1 mL via INTRAVENOUS

## 2019-09-18 MED ORDER — SODIUM CHLORIDE 0.9 % IV BOLUS
500.0000 mL | Freq: Once | INTRAVENOUS | Status: AC
  Administered 2019-09-18: 500 mL via INTRAVENOUS

## 2019-09-18 NOTE — ED Provider Notes (Signed)
[facility] EMERGENCY DEPARTMENT Provider Note   CSN: [id number] Arrival date & time: 09/17/19  2241     History   Chief Complaint Chief Complaint  Patient presents with  . Abnormal Labs    HPI [name] [name] is a 58 y.o. female.     Patient presents to the emergency department at the request of her doctor.  Patient has noticed over the last few weeks that she has been getting very short of breath with minimal exertion.  She has not noticed any chest pain with this exertion.  Patient has had multiple tests performed by her primary care doctor.  She was called tonight and told that her D-dimer was elevated and she should come to the ER for evaluation.  Patient has not had any weight gain or leg swelling associated with the shortness of breath.     Past Medical History:  Diagnosis Date  . AICD (automatic cardioverter/defibrillator) present 09   [employer]- replaced 2016  . Arthritis   . Bradycardia   . Breast cancer ([place]) 02/24/2016   s/p lumpectomy and radiation but refused chemotherapy  . Chronic systolic CHF (congestive heart failure) ([place])   . Cough due to angiotensin-converting enzyme inhibitor    But patient chooses to continue ACE  . Dizziness    Dizziness with standing after squatting, March, 2012  . Dyslipidemia    LDL elevation,Patient does not want statin  . Family history of breast cancer   . GERD (gastroesophageal reflux disease)   . H/O shortness of breath    CPX 10/18:  No evidence of cardiopulmonary limitation. Suspect exercise intolerance related to weight and deconditioning.   [name] Hypothyroidism   . ICD (implantable cardiac defibrillator) battery depletion    Dr. [name], June, 2009(artifactual atrial tachycardia response events addressed by reprogramming in parentheses  . Leg swelling    right leg  . Nonischemic cardiomyopathy ([place])    Etiology unknown, diagnosed 2008, patient has never had catheterization or nuclear study as of March, 2011  .  Personal history of radiation therapy 2017  . TIA (transient ischemic attack)    No CT or MRI abnormality, aspirin therapy    Patient Active Problem List   Diagnosis Date Noted  . Lumbar arthropathy 07/15/2019  . Anemia 07/15/2019  . History of colonic polyps 05/07/2019  . Genetic testing 11/22/2016  . Family history of breast cancer   . Breast cancer- radiation June 2016 08/05/2016  . Abnormal stress test 08/01/2016  . Cardiac LV ejection fraction 30-35% 07/18/2016  . Arm numbness 03/14/2016  . Facial numbness 03/14/2016  . Breast cancer of upper-inner quadrant of left female breast ([place]) 03/09/2016  . Chronic systolic CHF (congestive heart failure) ([place]) 02/26/2014  . Ejection fraction < 50%   . Itching   . Dizziness   . Bradycardia   . GERD (gastroesophageal reflux disease)   . Hypothyroidism   . Hyperlipidemia   . Leg swelling   . Cough due to angiotensin-converting enzyme inhibitor   . Dual implantable cardioverter-defibrillator in situ   . FOOT PAIN 01/20/2010  . History of TIA 2007 01/19/2010    Past Surgical History:  Procedure Laterality Date  . BREAST BIOPSY Left 02/24/2016   U/S Core  . BREAST BIOPSY Left 02/24/2016   U/S Core  . BREAST LUMPECTOMY Left 04/05/2016   invasive ductal   . BREAST LUMPECTOMY WITH RADIOACTIVE SEED AND SENTINEL LYMPH NODE BIOPSY Left 04/05/2016   Procedure: BREAST LUMPECTOMY WITH RADIOACTIVE SEED AND SENTINEL  LYMPH NODE BIOPSY;  Surgeon: [name], MD;  Location: [place];  Service: General;  Laterality: Left;  . CARDIAC CATHETERIZATION N/A 08/05/2016   Procedure: Left Heart Cath and Coronary Angiography;  Surgeon: Peter M Martinique, MD;  Location: [place] CV LAB;  Service: Cardiovascular;  Laterality: N/A;  . CARDIAC DEFIBRILLATOR PLACEMENT  09   [employer] no remote  . COLONOSCOPY    . CYST EXCISION Left    back of leg-lt   . IMPLANTABLE CARDIOVERTER DEFIBRILLATOR GENERATOR CHANGE N/A 12/29/2014   Procedure: IMPLANTABLE  CARDIOVERTER DEFIBRILLATOR GENERATOR CHANGE;  Surgeon: [name], MD;  Location: [facility] CATH LAB;  Service: Cardiovascular;  Laterality: N/A;  . LATERAL EPICONDYLE RELEASE  10/11/2012   Procedure: TENNIS ELBOW RELEASE;  Surgeon: [name], MD;  Location: [place];  Service: Orthopedics;  Laterality: Right;  RIGHT ELBOW: TENOTOMY ELBOW LATERAL EPICONDYLITIS TENNIS ELBOW: RADIAL TUNNEL RELEASE  . PORT-A-CATH REMOVAL  04/13/2016   Procedure: MINOR REMOVAL PORT-A-CATH;  Surgeon: [name], MD;  Location: [place];  Service: General;;  . PORTACATH PLACEMENT Right 04/05/2016   Procedure: INSERTION PORT-A-CATH WITH [place];  Surgeon: [name], MD;  Location: [place];  Service: General;  Laterality: Right;  right IJ  . TOTAL THYROIDECTOMY  2001  . TUBAL LIGATION  94  . UPPER GI ENDOSCOPY       OB History   No obstetric history on file.      Home Medications    Prior to Admission medications   Medication Sig Start Date End Date Taking? Authorizing Provider  aspirin EC 81 MG tablet Take 1 tablet (81 mg total) by mouth daily. 07/04/17   Richardson Dopp T, PA-C  carvedilol (COREG) 6.25 MG tablet Take 0.5 tablets (3.125 mg total) by mouth 2 (two) times daily. 09/10/19   Burtis Junes, NP  dicyclomine (BENTYL) 10 MG capsule Take 10 mg by mouth as directed.    [provider]  ENTRESTO 24-26 MG Take 1 tablet by mouth twice daily 04/09/19   Dorothy Spark, MD  fluticasone [facility]) 50 MCG/ACT nasal spray Place 2 sprays into both nostrils daily.    [provider]  furosemide (LASIX) 20 MG tablet Take 1 tablet by mouth once daily 07/12/19   Charlie Pitter, PA-C  levothyroxine (SYNTHROID) 88 MCG tablet TAKE 1 TABLET BY MOUTH  DAILY 07/02/19   Terald Sleeper, PA-C  omeprazole (PRILOSEC) 40 MG capsule Take 40 mg by mouth daily.    [provider]  traMADol (ULTRAM) 50 MG tablet Take 50 mg by mouth daily as needed. 08/27/19   [provider]    Family History Family History  Problem Relation Age of Onset  . Heart attack Mother   . Cancer Mother        breast  . Breast cancer Mother 39  . Hypertension Sister   . Hypertension Brother   . Cancer Maternal Aunt        breast  . Breast cancer Maternal Aunt   . Cancer Paternal Grandmother        possible cancer, unknown type  . Stroke Neg Hx   . Diabetes Neg Hx     Social History Social History   Tobacco Use  . Smoking status: Never Smoker  . Smokeless tobacco: Never Used  Substance Use Topics  . Alcohol use: No  . Drug use: No     Allergies   Avelox [moxifloxacin hcl in nacl],  Clindamycin/lincomycin, Kiwi extract, Bactrim [sulfamethoxazole-trimethoprim], Levofloxacin, Moxifloxacin, Spironolactone, and Sulfa antibiotics   Review of Systems Review of Systems  Respiratory: Positive for shortness of breath.   Cardiovascular: Negative for chest pain.  All other systems reviewed and are negative.    Physical Exam Updated Vital Signs BP 117/69   Pulse 68   Temp 99.1 F (37.3 C) (Oral)   Resp 15   Ht 5\' 8"  (1.727 m)   Wt 93.9 kg   SpO2 98%   BMI 31.47 kg/m   Physical Exam Vitals signs and nursing note reviewed.  Constitutional:      General: She is not in acute distress.    Appearance: Normal appearance. She is well-developed.  HENT:     Head: Normocephalic and atraumatic.     Right Ear: Hearing normal.     Left Ear: Hearing normal.     Nose: Nose normal.  Eyes:     Conjunctiva/sclera: Conjunctivae normal.     Pupils: Pupils are equal, round, and reactive to light.  Neck:     Musculoskeletal: Normal range of motion and neck supple.  Cardiovascular:     Rate and Rhythm: Regular rhythm.     Heart sounds: S1 normal and S2 normal. No murmur. No friction rub. No gallop.   Pulmonary:     Effort: Pulmonary effort is normal. No respiratory distress.     Breath sounds: Normal breath sounds.  Chest:     Chest wall: No tenderness.   Abdominal:     General: Bowel sounds are normal.     Palpations: Abdomen is soft.     Tenderness: There is no abdominal tenderness. There is no guarding or rebound. Negative signs include Murphy's sign and McBurney's sign.     Hernia: No hernia is present.  Musculoskeletal: Normal range of motion.  Skin:    General: Skin is warm and dry.     Findings: No rash.  Neurological:     Mental Status: She is alert and oriented to person, place, and time.     GCS: GCS eye subscore is 4. GCS verbal subscore is 5. GCS motor subscore is 6.     Cranial Nerves: No cranial nerve deficit.     Sensory: No sensory deficit.     Coordination: Coordination normal.  Psychiatric:        Speech: Speech normal.        Behavior: Behavior normal.        Thought Content: Thought content normal.      ED Treatments / Results  Labs (all labs ordered are listed, but only abnormal results are displayed) Labs Reviewed  BASIC METABOLIC PANEL - Abnormal; Notable for the following components:      Result Value   Glucose, Bld 102 (*)    Creatinine, Ser 1.21 (*)    GFR calc non Af Amer 49 (*)    GFR calc Af Amer 57 (*)    All other components within normal limits  CBC WITH DIFFERENTIAL/PLATELET  BRAIN NATRIURETIC PEPTIDE  TROPONIN I (HIGH SENSITIVITY)  TROPONIN I (HIGH SENSITIVITY)    EKG EKG Interpretation  Date/Time:  Tuesday September 17 2019 23:06:22 EST Ventricular Rate:  72 PR Interval:  182 QRS Duration: 90 QT Interval:  390 QTC Calculation: 427 R Axis:   12 Text Interpretation: Normal sinus rhythm Cannot rule out Anterior infarct , age undetermined Abnormal ECG No significant change was found Confirmed by Ezequiel Essex [id number]) on 09/17/2019 11:33:18 PM   Radiology Ct Angio  Chest Pe W Or Wo Contrast  Result Date: 09/18/2019 CLINICAL DATA:  Elevated D-dimer, shortness of breath and chest pain for 2 weeks EXAM: CT ANGIOGRAPHY CHEST WITH CONTRAST TECHNIQUE: Multidetector CT imaging of the  chest was performed using the standard protocol during bolus administration of intravenous contrast. Multiplanar CT image reconstructions and MIPs were obtained to evaluate the vascular anatomy. CONTRAST:  118mL OMNIPAQUE IOHEXOL 350 MG/ML SOLN COMPARISON:  CTA 12/12/2014 FINDINGS: Cardiovascular: Satisfactory opacification the pulmonary arteries to the segmental level. No pulmonary artery filling defects are identified. Central pulmonary arteries are normal caliber. No elevation of the RV/LV ratio (0.65). Mild cardiomegaly. Pacer/defibrillator battery pack within the soft tissue left chest wall with leads at the cardiac apex and right atrium. No pericardial effusion. Normal caliber thoracic aorta. Minimal atherosclerotic plaque. Normal 3 vessel branching of the arch. Proximal great vessels normally opacified. Mediastinum/Nodes: Surgical clips at the thoracic inlet correlate with reported history of thyroidectomy. No acute abnormality of the trachea or esophagus. No mediastinal, hilar or axillary adenopathy. Lungs/Pleura: No consolidation, features of edema, pneumothorax, or effusion. No suspicious pulmonary nodules or masses. Upper Abdomen: No acute abnormalities present in the visualized portions of the upper abdomen. Scattered colonic diverticula without focal pericolonic inflammation to suggest diverticulitis. Musculoskeletal: Multilevel degenerative changes are present in the imaged portions of the spine. No acute osseous abnormality or suspicious osseous lesion. No suspicious chest wall lesions. Postsurgical changes in the left breast, correlate with prior lumpectomy. Review of the MIP images confirms the above findings. IMPRESSION: 1. No evidence of pulmonary embolism. No acute intrathoracic process. 2. Postsurgical changes from prior left breast lumpectomy. 3. Prior thyroidectomy. 4. Colonic diverticulosis. 5. Cardiomegaly. 6.  Aortic Atherosclerosis (ICD10-I70.0). Electronically Signed   By: Lovena Le  M.D.   On: 09/18/2019 01:40    Procedures Procedures (including critical care time)  Medications Ordered in ED Medications  sodium chloride 0.9 % bolus 500 mL (has no administration in time range)  iohexol (OMNIPAQUE) 350 MG/ML injection 100 mL (100 mLs Intravenous Contrast Given 09/18/19 0119)     Initial Impression / Assessment and Plan / ED Course  I have reviewed the triage vital signs and the nursing notes.  Pertinent labs & imaging results that were available during my care of the patient were reviewed by me and considered in my medical decision making (see chart for details).        Patient presents to the emergency department for evaluation of shortness of breath.  She has had shortness of breath and a tightness in her chest associated with exertion for the last 2 weeks.  During outpatient work-up by PCP she had an elevated D-dimer and was referred to the ER.  Her work-up here is entirely reassuring.  She has normal vital signs.  This includes no tachycardia, tachypnea or hypoxia.  EKG does not show any evidence of ischemia or infarct.  Troponin negative x2.  BNP normal.  Chest is clear, no clinical concern for congestive heart failure or pneumonia.  CT angiography performed.  No PE or other pathology noted.  Patient reassured, continue outpatient work-up by PCP.  May need outpatient echo or cardiology follow-up.  Final Clinical Impressions(s) / ED Diagnoses   Final diagnoses:  DOE (dyspnea on exertion)    ED Discharge Orders    None       , Gwenyth Allegra, MD 09/18/19 [phone]

## 2019-09-18 NOTE — Telephone Encounter (Signed)
Called patient with CT results. Informed patient to proceed with echo as planned per Truitt Merle NP. Patient verbalized understanding.

## 2019-09-18 NOTE — Telephone Encounter (Signed)
-----   Message from Burtis Junes, NP sent at 09/18/2019  7:51 AM EST ----- Please call,   Noted that the CT is negative for PE - this is good. Still not able to explain the D dimer value.   Needs to proceed with echo as planned.   [name]

## 2019-09-23 ENCOUNTER — Telehealth: Payer: Self-pay | Admitting: Cardiology

## 2019-09-23 ENCOUNTER — Encounter: Payer: Self-pay | Admitting: Nurse Practitioner

## 2019-09-23 NOTE — Telephone Encounter (Signed)
LVM, Calling Kevan Rosebush, RN at HF clinic on behalf of Truitt Merle, NP.  Wanted to know where pt is in the que to be seen in clinic.  [name] has run out of options and Electrical engineer for bugging HF clinic.  Pt has lots of HF symptoms.

## 2019-09-23 NOTE — Telephone Encounter (Signed)
Will arrange for her to be out of work at least thru December 14 as her evaluation continues.  I have reached out to Dr. [name] for her to review our last 2 visits and the findings from [name] [name]'s testing.   We will see where we are with CHF referral/appointment.   Burtis Junes, RN, [place] [address] [place], [place]  [address] [phone]

## 2019-09-23 NOTE — Telephone Encounter (Signed)
New Message   Pt is calling and is needing a note for work to cover her being out until she gets her FMLA paperwork.  She would like to come to the office today to pick up the note for work     Please call back

## 2019-09-24 ENCOUNTER — Encounter: Payer: Self-pay | Admitting: *Deleted

## 2019-09-24 ENCOUNTER — Telehealth: Payer: Self-pay | Admitting: *Deleted

## 2019-09-24 DIAGNOSIS — I5022 Chronic systolic (congestive) heart failure: Secondary | ICD-10-CM

## 2019-09-24 DIAGNOSIS — R0602 Shortness of breath: Secondary | ICD-10-CM

## 2019-09-24 NOTE — Telephone Encounter (Signed)
-----   Message from Burtis Junes, NP sent at 09/24/2019 11:50 AM EST ----- Thanks so much - I will let you order under Dr. [name].   Thanks again,  [name] ----- Message ----- From: Dorothy Spark, MD Sent: 09/24/2019  11:25 AM EST To: [name], LPN, Burtis Junes, NP  I agree with CHF referral and please order cardiopulmonary testing (CPX). Thank you ----- Message ----- From: [name], LPN Sent: [id number]   7:23 AM EST To: Dorothy Spark, MD  Please review Lori's message below on mutual pt.  Please review last 2 OV notes and advise to Truitt Merle NP what she should do for this pt.  Thanks, Karlene Einstein  ----- Message ----- From: Burtis Junes, NP Sent: 09/23/2019  10:13 AM EST To: [name], LPN  Karlene Einstein,  Can you help - I sent Dr. [name] a message to review Terrilynn's last 2 visits.   I do not know what is wrong with her - nor what to do next. I have referred her to CHF clinic.   If you talk with her can you ask her to look at this?  Thanks [name]

## 2019-09-24 NOTE — Telephone Encounter (Signed)
Spoke with the pt and informed her that per Truitt Merle NP and Dr. [name], they agree to order for her to have a CPX done, to further evaluate her symptoms and ongoing SOB.  Pt education provided on what a CPX will look at.  Informed the pt that I will place the order in the system, and send a message to our [facility] schedulers to call the pt back and arrange for this test to be done.  Informed the pt that I will mail her, her general CPX instructions to her home address, for her to follow when preparing for this test.  Pt verbalized understanding and agrees with this plan.

## 2019-09-25 ENCOUNTER — Telehealth: Payer: Self-pay | Admitting: Nurse Practitioner

## 2019-09-25 NOTE — Telephone Encounter (Signed)
FMLA paperwork received from [employer]. Placing in box for Truitt Merle, NP to review. 09/25/19 vlm

## 2019-10-01 NOTE — Telephone Encounter (Signed)
-----   Message from Armando Gang sent at 10/01/2019 11:57 AM EST ----- Regarding: appt for Corinna Capra F/U with Arbutus Leas   and she forward the message regarding patient.  ----- Message ----- From: Claudia Pollock Sent: 10/01/2019  11:54 AM EST To: Armando Gang  See below ----- Message ----- From: Claudia Pollock Sent: 10/01/2019  11:04 AM EST To: Scarlette Calico, RN  PT is going back to work 12/14 she will not be able to quarantine , she thought the CPX and the visit w/ the doctor would be before 12/14, first ava cpx Jan, .. please advise ----- Message ----- From: Scarlette Calico, RN Sent: 09/27/2019   4:26 PM EST To: [name], welcome back, this is a new pt ref for chf per dr [name], they have also ordered a cpx, can you please sch that and then she needs new pt appt w/DB after that, thanks ----- Message ----- From: Burtis Junes, NP Sent: 09/17/2019   2:32 PM EST To: Scarlette Calico, RN  Hi Heather,   I have placed a referral for Evelene to be seen in the advanced CHF clinic.   [name]

## 2019-10-01 NOTE — Telephone Encounter (Deleted)
Message sent to Dr. [name] about this pt not being able to do this test at this time, due to being unable to self quarantine for this test and upcoming work schedule.  Per scheduler of CPX, the next available to do this test is in January.  Asked Dr. [name] to advise on whether this is acceptable for the pt to wait until January 2021 to have her CPX done, or should alternative testing be ordered.

## 2019-10-01 NOTE — Telephone Encounter (Signed)
Pts CPX is scheduled for 11/04/2019, and her new pt appt with Dr. [name] in CHF,  is scheduled for 11/11/2019 at 3:00 pm.  CHF clinic made the pt aware of appt dates and times.

## 2019-10-02 ENCOUNTER — Telehealth: Payer: Self-pay | Admitting: *Deleted

## 2019-10-02 ENCOUNTER — Encounter: Payer: Self-pay | Admitting: *Deleted

## 2019-10-02 NOTE — Telephone Encounter (Signed)
-----   Message from Scarlette Calico, RN sent at 10/02/2019 11:49 AM EST ----- Regarding: RE: appt for [name] [name] She's sch for cpx 1/11 and Dr [name] 1/18, thanks ----- Message ----- From: [name], LPN Sent: [id number]   2:18 PM EST To: Milas Kocher, RN, # Subject: FW: appt for [name] [name]                              Per Dr. [name], its ok with her if the pt has to wait until next available CPX appt in Jan 2021.  When you guys schedule, can you let us know the date of CPX and new pt appt with DB?  Thanks, Rayme Bui Dr. Francesca Oman nurse  ----- Message ----- From: Dorothy Spark, MD Sent: 10/01/2019   2:15 PM EST To: [name], LPN Subject: RE: appt for [name] [name] ok ----- Message ----- From: [name], LPN Sent: [id number]   2:07 PM EST To: Dorothy Spark, MD Subject: Melton Alar: appt for [name] [name]                              Dr. [name], pt cannot do CPX until January, because she is starting work back on 12/14, and cannot quarantine the amount of time required for this test.  Please advise if you want her to wait until January to have CPX done or advise on alternative testing.   Thanks, Karlene Einstein  ----- Message ----- From: Armando Gang Sent: 10/01/2019  11:57 AM EST To: [name], LPN Subject: appt for [name] [name]                                  F/U with [name] [name]   and she forward the message regarding patient.  ----- Message ----- From: Claudia Pollock Sent: 10/01/2019  11:54 AM EST To: Armando Gang  See below ----- Message ----- From: Claudia Pollock Sent: 10/01/2019  11:04 AM EST To: Scarlette Calico, RN  PT is going back to work 12/14 she will not be able to quarantine , she thought the CPX and the visit w/ the doctor would be before 12/14, first ava cpx Jan, .. please advise ----- Message ----- From: Scarlette Calico, RN Sent: 09/27/2019   4:26 PM EST To: [name], welcome back, this is a new pt ref for chf per  dr [name], they have also ordered a cpx, can you please sch that and then she needs new pt appt w/DB after that, thanks ----- Message ----- From: Burtis Junes, NP Sent: 09/17/2019   2:32 PM EST To: Scarlette Calico, RN  Hi Heather,   I have placed a referral for [name] to be seen in the advanced CHF clinic.   [name]

## 2019-10-02 NOTE — Telephone Encounter (Signed)
S/w pt is aware a return to work note per [name] is at office to be picked up.  Stated [name] filled out two sets of forms on Monday, December 7 for pt's FMLA and short term disability.

## 2019-10-07 ENCOUNTER — Ambulatory Visit (INDEPENDENT_AMBULATORY_CARE_PROVIDER_SITE_OTHER): Payer: BC Managed Care – PPO | Admitting: Physician Assistant

## 2019-10-07 ENCOUNTER — Encounter: Payer: Self-pay | Admitting: Physician Assistant

## 2019-10-07 DIAGNOSIS — J329 Chronic sinusitis, unspecified: Secondary | ICD-10-CM

## 2019-10-07 MED ORDER — CEFDINIR 300 MG PO CAPS: 300.0000 mg | ORAL_CAPSULE | Freq: Two times a day (BID) | ORAL | 0 refills | Status: DC

## 2019-10-07 MED ORDER — ALBUTEROL SULFATE HFA 108 (90 BASE) MCG/ACT IN AERS: 2.0000 | INHALATION_SPRAY | Freq: Four times a day (QID) | RESPIRATORY_TRACT | 0 refills | Status: DC | PRN

## 2019-10-07 MED ORDER — PREDNISONE 20 MG PO TABS: 20.0000 mg | ORAL_TABLET | Freq: Every day | ORAL | 0 refills | Status: DC

## 2019-10-07 NOTE — Progress Notes (Signed)
Telephone visit  Subjective: [id number] and cough PCP: Terald Sleeper, PA-C [id number] [name] [name] is a 58 y.o. female calls for telephone consult today. Patient provides verbal consent for consult held via phone.  Patient is identified with 2 separate identifiers.  At this time the entire area is on COVID-19 social distancing and stay home orders are in place.  Patient is of higher risk and therefore we are performing this by a virtual method.  Location of patient: home Location of provider: [facility] Others present for call: no  27 August 2014 the patient began with some symptoms of shortness of breath fatigue upper respiratory congestion.  She was seen through urgent care and found to be negative for flu, strep, Covid.  She was sent to her cardiologist.  Because I felt that it could be something cardiac related.  She is even gone as far as having a CT to rule out a blood clot in her lungs.  She does have follow-up with cardiology in the coming weeks.  This patient has had many weeks of sinus headache and postnasal drainage. There is copious drainage at times. Denies any fever at this time. There has been a history of sinus infections in the past.  No history of sinus surgery. There is cough at night. It has become more prevalent in recent days.    ROS: Per HPI  Allergies  Allergen Reactions  . Avelox [Moxifloxacin Hcl In Nacl] Shortness Of Breath    Shortness of breath  . Clindamycin/Lincomycin Shortness Of Breath and Diarrhea    Breathing/diarrhea    . Kiwi Extract Anaphylaxis    Makes pt. Feel like her throat is closing   . Bactrim [Sulfamethoxazole-Trimethoprim] Swelling  . Levofloxacin Other (See Comments)    JOINT PAIN JOINT PAIN  . Moxifloxacin Other (See Comments)    Breathing problems Breathing problems  . Spironolactone Other (See Comments)    burning  . Sulfa Antibiotics Swelling   Past Medical History:  Diagnosis Date  . AICD (automatic  cardioverter/defibrillator) present 09   [employer]- replaced 2016  . Arthritis   . Bradycardia   . Breast cancer ([place]) 02/24/2016   s/p lumpectomy and radiation but refused chemotherapy  . Chronic systolic CHF (congestive heart failure) ([place])   . Cough due to angiotensin-converting enzyme inhibitor    But patient chooses to continue ACE  . Dizziness    Dizziness with standing after squatting, March, 2012  . Dyslipidemia    LDL elevation,Patient does not want statin  . Family history of breast cancer   . GERD (gastroesophageal reflux disease)   . H/O shortness of breath    CPX 10/18:  No evidence of cardiopulmonary limitation. Suspect exercise intolerance related to weight and deconditioning.   [name] Hypothyroidism   . ICD (implantable cardiac defibrillator) battery depletion    Dr. [name], June, 2009(artifactual atrial tachycardia response events addressed by reprogramming in parentheses  . Leg swelling    right leg  . Nonischemic cardiomyopathy ([place])    Etiology unknown, diagnosed 2008, patient has never had catheterization or nuclear study as of March, 2011  . Personal history of radiation therapy 2017  . TIA (transient ischemic attack)    No CT or MRI abnormality, aspirin therapy    Current Outpatient Medications:  .  albuterol (VENTOLIN HFA) 108 (90 Base) MCG/ACT inhaler, Inhale 2 puffs into the lungs every 6 (six) hours as needed for wheezing or shortness of breath., Disp: 18 g, Rfl: 0 .  aspirin EC 81 MG tablet, Take 1 tablet (81 mg total) by mouth daily., Disp: 90 tablet, Rfl: 3 .  carvedilol (COREG) 6.25 MG tablet, Take 0.5 tablets (3.125 mg total) by mouth 2 (two) times daily., Disp: 180 tablet, Rfl: 2 .  cefdinir (OMNICEF) 300 MG capsule, Take 1 capsule (300 mg total) by mouth 2 (two) times daily. 1 po BID, Disp: 20 capsule, Rfl: 0 .  dicyclomine (BENTYL) 10 MG capsule, Take 10 mg by mouth as directed., Disp: , Rfl:  .  ENTRESTO 24-26 MG, Take 1 tablet by mouth twice  daily, Disp: 60 tablet, Rfl: 9 .  fluticasone (FLONASE) 50 MCG/ACT nasal spray, Place 2 sprays into both nostrils daily., Disp: , Rfl:  .  furosemide (LASIX) 20 MG tablet, Take 1 tablet by mouth once daily, Disp: 90 tablet, Rfl: 2 .  levothyroxine (SYNTHROID) 88 MCG tablet, TAKE 1 TABLET BY MOUTH  DAILY, Disp: 90 tablet, Rfl: 3 .  omeprazole (PRILOSEC) 40 MG capsule, Take 40 mg by mouth daily., Disp: , Rfl:  .  predniSONE (DELTASONE) 20 MG tablet, Take 1 tablet (20 mg total) by mouth daily with breakfast., Disp: 5 tablet, Rfl: 0 .  traMADol (ULTRAM) 50 MG tablet, Take 50 mg by mouth daily as needed., Disp: , Rfl:   Assessment/ Plan: 58 y.o. female   1. Sinusitis, unspecified chronicity, unspecified location - albuterol (VENTOLIN HFA) 108 (90 Base) MCG/ACT inhaler; Inhale 2 puffs into the lungs every 6 (six) hours as needed for wheezing or shortness of breath.  Dispense: 18 g; Refill: 0 - cefdinir (OMNICEF) 300 MG capsule; Take 1 capsule (300 mg total) by mouth 2 (two) times daily. 1 po BID  Dispense: 20 capsule; Refill: 0 - predniSONE (DELTASONE) 20 MG tablet; Take 1 tablet (20 mg total) by mouth daily with breakfast.  Dispense: 5 tablet; Refill: 0   No follow-ups on file.  Continue all other maintenance medications as listed above.  Start time: 8:49 AM End time: 9:00 AM  Meds ordered this encounter  Medications  . albuterol (VENTOLIN HFA) 108 (90 Base) MCG/ACT inhaler    Sig: Inhale 2 puffs into the lungs every 6 (six) hours as needed for wheezing or shortness of breath.    Dispense:  18 g    Refill:  0    Order Specific Question:   Supervising Provider    Answer:   Janora Norlander [id number]  . cefdinir (OMNICEF) 300 MG capsule    Sig: Take 1 capsule (300 mg total) by mouth 2 (two) times daily. 1 po BID    Dispense:  20 capsule    Refill:  0    Order Specific Question:   Supervising Provider    Answer:   Janora Norlander [id number]  . predniSONE (DELTASONE) 20 MG tablet     Sig: Take 1 tablet (20 mg total) by mouth daily with breakfast.    Dispense:  5 tablet    Refill:  0    Order Specific Question:   Supervising Provider    Answer:   Janora Norlander [id number]    Particia Nearing PA-C [place] [phone]

## 2019-10-20 ENCOUNTER — Encounter: Payer: Self-pay | Admitting: Physician Assistant

## 2019-10-25 DIAGNOSIS — U071 COVID-19: Secondary | ICD-10-CM

## 2019-10-25 DIAGNOSIS — J1282 Pneumonia due to coronavirus disease 2019: Secondary | ICD-10-CM

## 2019-10-25 HISTORY — DX: COVID-19: U07.1

## 2019-10-25 HISTORY — DX: Pneumonia due to coronavirus disease 2019: J12.82

## 2019-10-28 ENCOUNTER — Other Ambulatory Visit: Payer: Self-pay

## 2019-10-28 ENCOUNTER — Other Ambulatory Visit: Payer: Self-pay | Admitting: Physician Assistant

## 2019-10-28 MED ORDER — AMOXICILLIN 500 MG PO CAPS: 1000.0000 mg | ORAL_CAPSULE | Freq: Two times a day (BID) | ORAL | 0 refills | Status: DC

## 2019-10-31 ENCOUNTER — Encounter (HOSPITAL_COMMUNITY): Payer: Self-pay | Admitting: *Deleted

## 2019-10-31 ENCOUNTER — Emergency Department (HOSPITAL_COMMUNITY): Payer: BC Managed Care – PPO

## 2019-10-31 ENCOUNTER — Other Ambulatory Visit: Payer: Self-pay

## 2019-10-31 ENCOUNTER — Emergency Department (HOSPITAL_COMMUNITY)
Admission: EM | Admit: 2019-10-31 | Discharge: 2019-10-31 | Disposition: A | Discharge status: Home / Self Care | Payer: BC Managed Care – PPO | Attending: Emergency Medicine | Admitting: Emergency Medicine

## 2019-10-31 DIAGNOSIS — Z853 Personal history of malignant neoplasm of breast: Secondary | ICD-10-CM | POA: Insufficient documentation

## 2019-10-31 DIAGNOSIS — D649 Anemia, unspecified: Secondary | ICD-10-CM | POA: Diagnosis not present

## 2019-10-31 DIAGNOSIS — Z7982 Long term (current) use of aspirin: Secondary | ICD-10-CM | POA: Diagnosis not present

## 2019-10-31 DIAGNOSIS — E039 Hypothyroidism, unspecified: Secondary | ICD-10-CM | POA: Insufficient documentation

## 2019-10-31 DIAGNOSIS — U071 COVID-19: Secondary | ICD-10-CM

## 2019-10-31 DIAGNOSIS — J329 Chronic sinusitis, unspecified: Secondary | ICD-10-CM

## 2019-10-31 DIAGNOSIS — R5381 Other malaise: Secondary | ICD-10-CM | POA: Diagnosis not present

## 2019-10-31 DIAGNOSIS — Z79899 Other long term (current) drug therapy: Secondary | ICD-10-CM | POA: Diagnosis not present

## 2019-10-31 DIAGNOSIS — R0600 Dyspnea, unspecified: Secondary | ICD-10-CM | POA: Insufficient documentation

## 2019-10-31 DIAGNOSIS — R05 Cough: Secondary | ICD-10-CM | POA: Insufficient documentation

## 2019-10-31 DIAGNOSIS — R509 Fever, unspecified: Secondary | ICD-10-CM | POA: Diagnosis not present

## 2019-10-31 DIAGNOSIS — M7918 Myalgia, other site: Secondary | ICD-10-CM | POA: Diagnosis not present

## 2019-10-31 DIAGNOSIS — Z8673 Personal history of transient ischemic attack (TIA), and cerebral infarction without residual deficits: Secondary | ICD-10-CM | POA: Insufficient documentation

## 2019-10-31 DIAGNOSIS — W19XXXA Unspecified fall, initial encounter: Secondary | ICD-10-CM | POA: Diagnosis not present

## 2019-10-31 DIAGNOSIS — J1282 Pneumonia due to coronavirus disease 2019: Secondary | ICD-10-CM | POA: Diagnosis not present

## 2019-10-31 DIAGNOSIS — R438 Other disturbances of smell and taste: Secondary | ICD-10-CM | POA: Diagnosis not present

## 2019-10-31 DIAGNOSIS — R0902 Hypoxemia: Secondary | ICD-10-CM | POA: Diagnosis not present

## 2019-10-31 DIAGNOSIS — R0602 Shortness of breath: Secondary | ICD-10-CM | POA: Diagnosis not present

## 2019-10-31 LAB — CBC WITH DIFFERENTIAL/PLATELET
Abs Immature Granulocytes: 0.03 10*3/uL (ref 0.00–0.07)
Basophils Absolute: 0 10*3/uL (ref 0.0–0.1)
Basophils Relative: 0 %
Eosinophils Absolute: 0 10*3/uL (ref 0.0–0.5)
Eosinophils Relative: 0 %
HCT: 35.5 % — ABNORMAL LOW (ref 36.0–46.0)
Hemoglobin: 11.3 g/dL — ABNORMAL LOW (ref 12.0–15.0)
Immature Granulocytes: 1 %
Lymphocytes Relative: 15 %
Lymphs Abs: 1 10*3/uL (ref 0.7–4.0)
MCH: 30 pg (ref 26.0–34.0)
MCHC: 31.8 g/dL (ref 30.0–36.0)
MCV: 94.2 fL (ref 80.0–100.0)
Monocytes Absolute: 0.4 10*3/uL (ref 0.1–1.0)
Monocytes Relative: 6 %
Neutro Abs: 5 10*3/uL (ref 1.7–7.7)
Neutrophils Relative %: 78 %
Platelets: 242 10*3/uL (ref 150–400)
RBC: 3.77 MIL/uL — ABNORMAL LOW (ref 3.87–5.11)
RDW: 12.7 % (ref 11.5–15.5)
WBC: 6.4 10*3/uL (ref 4.0–10.5)
nRBC: 0 % (ref 0.0–0.2)

## 2019-10-31 LAB — COMPREHENSIVE METABOLIC PANEL
ALT: 15 U/L (ref 0–44)
AST: 22 U/L (ref 15–41)
Albumin: 3.4 g/dL — ABNORMAL LOW (ref 3.5–5.0)
Alkaline Phosphatase: 42 U/L (ref 38–126)
Anion gap: 9 (ref 5–15)
BUN: 15 mg/dL (ref 6–20)
CO2: 25 mmol/L (ref 22–32)
Calcium: 8.8 mg/dL — ABNORMAL LOW (ref 8.9–10.3)
Chloride: 100 mmol/L (ref 98–111)
Creatinine, Ser: 0.93 mg/dL (ref 0.44–1.00)
GFR calc Af Amer: >60 mL/min (ref >60)
GFR calc non Af Amer: >60 mL/min (ref >60)
Glucose, Bld: 101 mg/dL — ABNORMAL HIGH (ref 70–99)
Potassium: 4.2 mmol/L (ref 3.5–5.1)
Sodium: 134 mmol/L — ABNORMAL LOW (ref 135–145)
Total Bilirubin: 0.6 mg/dL (ref 0.3–1.2)
Total Protein: 7.1 g/dL (ref 6.5–8.1)

## 2019-10-31 LAB — C-REACTIVE PROTEIN: CRP: 8.5 mg/dL — ABNORMAL HIGH (ref <1.0)

## 2019-10-31 LAB — URINALYSIS, ROUTINE W REFLEX MICROSCOPIC
Bilirubin Urine: NEGATIVE
Glucose, UA: NEGATIVE mg/dL
Hgb urine dipstick: NEGATIVE
Ketones, ur: 5 mg/dL — AB
Leukocytes,Ua: NEGATIVE
Nitrite: NEGATIVE
Protein, ur: NEGATIVE mg/dL
Specific Gravity, Urine: 1.006 (ref 1.005–1.030)
pH: 6 (ref 5.0–8.0)

## 2019-10-31 LAB — LACTATE DEHYDROGENASE: LDH: 324 U/L — ABNORMAL HIGH (ref 98–192)

## 2019-10-31 LAB — HIV ANTIBODY (ROUTINE TESTING W REFLEX): HIV Screen 4th Generation wRfx: NONREACTIVE

## 2019-10-31 LAB — PROCALCITONIN: Procalcitonin: <0.1 ng/mL

## 2019-10-31 LAB — LACTIC ACID, PLASMA
Lactic Acid, Venous: 0.7 mmol/L (ref 0.5–1.9)
Lactic Acid, Venous: 0.7 mmol/L (ref 0.5–1.9)

## 2019-10-31 LAB — APTT: aPTT: 41 seconds — ABNORMAL HIGH (ref 24–36)

## 2019-10-31 LAB — FERRITIN: Ferritin: 174 ng/mL (ref 11–307)

## 2019-10-31 LAB — PROTIME-INR
INR: 1 (ref 0.8–1.2)
Prothrombin Time: 13.1 seconds (ref 11.4–15.2)

## 2019-10-31 LAB — FIBRINOGEN: Fibrinogen: 714 mg/dL — ABNORMAL HIGH (ref 210–475)

## 2019-10-31 LAB — ABO/RH: ABO/RH(D): A POS

## 2019-10-31 LAB — D-DIMER, QUANTITATIVE: D-Dimer, Quant: 2.46 ug/mL-FEU — ABNORMAL HIGH (ref 0.00–0.50)

## 2019-10-31 MED ORDER — SODIUM CHLORIDE 0.9 % IV BOLUS
1000.0000 mL | Freq: Once | INTRAVENOUS | Status: AC
  Administered 2019-10-31: 1000 mL via INTRAVENOUS

## 2019-10-31 MED ORDER — DOXYCYCLINE HYCLATE 100 MG PO CAPS: 100.0000 mg | ORAL_CAPSULE | Freq: Two times a day (BID) | ORAL | 0 refills | Status: DC

## 2019-10-31 MED ORDER — ACETAMINOPHEN 325 MG PO TABS
650.0000 mg | ORAL_TABLET | Freq: Once | ORAL | Status: AC
  Administered 2019-10-31: 650 mg via ORAL
  Filled 2019-10-31: qty 2

## 2019-10-31 MED ORDER — SODIUM CHLORIDE 0.9 % IV SOLN
2.0000 g | INTRAVENOUS | Status: DC
  Administered 2019-10-31: 2 g via INTRAVENOUS
  Filled 2019-10-31: qty 20

## 2019-10-31 MED ORDER — SODIUM CHLORIDE 0.9 % IV SOLN
500.0000 mg | INTRAVENOUS | Status: DC
  Administered 2019-10-31: 500 mg via INTRAVENOUS
  Filled 2019-10-31: qty 500

## 2019-10-31 NOTE — ED Triage Notes (Signed)
Pt arrived [facility]. Per patient, started feeling ill 12/31, got tested and was covid + a couple days after. Says she feels "give out" and very tired. Has had nausea, diarrhea, and coughing spells.

## 2019-10-31 NOTE — ED Notes (Signed)
Ambulated patient in room, SpO2=94-97%, appeared to have mildly increased WOB during ambulation.

## 2019-10-31 NOTE — Discharge Instructions (Signed)
Drink plenty of fluids.   Take acetaminophen as needed for fever or aching.  Return if symptoms are getting worse - especially if you are having trouble breathing.

## 2019-10-31 NOTE — ED Provider Notes (Signed)
[facility] EMERGENCY DEPARTMENT Provider Note   CSN: [id number] Arrival date & time: 10/31/19  0035   History Chief Complaint  Patient presents with  . Shortness of Breath    [name] [name] is a 59 y.o. female.  The history is provided by the patient.  Shortness of Breath She has history of nonischemic cardiomyopathy, hyperlipidemia with implanted defibrillator and comes in with generally not feeling well.  She has been taking care of her mother who was Covid positive, and had a positive COVID-19 test on January 4.  For the last 2 days, she states that she has had no energy whatsoever.  She has been running low-grade fevers up to 99 without chills or sweats.  She states that things do not taste right but still has her sense of taste and smell.  There is a slight cough which is nonproductive.  She is complaining of some mild dyspnea.  She denies nausea or vomiting but has had diarrhea.  She is complaining of generalized body aches.  She has used over-the-counter cough and cold medication without any benefit.  Past Medical History:  Diagnosis Date  . AICD (automatic cardioverter/defibrillator) present 09   [employer]- replaced 2016  . Arthritis   . Bradycardia   . Breast cancer ([place]) 02/24/2016   s/p lumpectomy and radiation but refused chemotherapy  . Chronic systolic CHF (congestive heart failure) ([place])   . Cough due to angiotensin-converting enzyme inhibitor    But patient chooses to continue ACE  . Dizziness    Dizziness with standing after squatting, March, 2012  . Dyslipidemia    LDL elevation,Patient does not want statin  . Family history of breast cancer   . GERD (gastroesophageal reflux disease)   . H/O shortness of breath    CPX 10/18:  No evidence of cardiopulmonary limitation. Suspect exercise intolerance related to weight and deconditioning.   [name] Hypothyroidism   . ICD (implantable cardiac defibrillator) battery depletion    Dr. [name], June, 2009(artifactual  atrial tachycardia response events addressed by reprogramming in parentheses  . Leg swelling    right leg  . Nonischemic cardiomyopathy ([place])    Etiology unknown, diagnosed 2008, patient has never had catheterization or nuclear study as of March, 2011  . Personal history of radiation therapy 2017  . TIA (transient ischemic attack)    No CT or MRI abnormality, aspirin therapy    Patient Active Problem List   Diagnosis Date Noted  . Lumbar arthropathy 07/15/2019  . Anemia 07/15/2019  . History of colonic polyps 05/07/2019  . Genetic testing 11/22/2016  . Family history of breast cancer   . Breast cancer- radiation June 2016 08/05/2016  . Abnormal stress test 08/01/2016  . Cardiac LV ejection fraction 30-35% 07/18/2016  . Arm numbness 03/14/2016  . Facial numbness 03/14/2016  . Breast cancer of upper-inner quadrant of left female breast ([place]) 03/09/2016  . Chronic systolic CHF (congestive heart failure) ([place]) 02/26/2014  . Ejection fraction < 50%   . Itching   . Dizziness   . Bradycardia   . GERD (gastroesophageal reflux disease)   . Hypothyroidism   . Hyperlipidemia   . Leg swelling   . Cough due to angiotensin-converting enzyme inhibitor   . Dual implantable cardioverter-defibrillator in situ   . FOOT PAIN 01/20/2010  . History of TIA 2007 01/19/2010    Past Surgical History:  Procedure Laterality Date  . BREAST BIOPSY Left 02/24/2016   U/S Core  . BREAST BIOPSY Left 02/24/2016  U/S Core  . BREAST LUMPECTOMY Left 04/05/2016   invasive ductal   . BREAST LUMPECTOMY WITH RADIOACTIVE SEED AND SENTINEL LYMPH NODE BIOPSY Left 04/05/2016   Procedure: BREAST LUMPECTOMY WITH RADIOACTIVE SEED AND SENTINEL LYMPH NODE BIOPSY;  Surgeon: [name], MD;  Location: [facility];  Service: General;  Laterality: Left;  . CARDIAC CATHETERIZATION N/A 08/05/2016   Procedure: Left Heart Cath and Coronary Angiography;  Surgeon: Peter M Martinique, MD;  Location: [facility];  Service:  Cardiovascular;  Laterality: N/A;  . CARDIAC DEFIBRILLATOR PLACEMENT  09   [employer] no remote  . COLONOSCOPY    . CYST EXCISION Left    back of leg-lt   . IMPLANTABLE CARDIOVERTER DEFIBRILLATOR GENERATOR CHANGE N/A 12/29/2014   Procedure: IMPLANTABLE CARDIOVERTER DEFIBRILLATOR GENERATOR CHANGE;  Surgeon: [name], MD;  Location: [facility] CATH LAB;  Service: Cardiovascular;  Laterality: N/A;  . LATERAL EPICONDYLE RELEASE  10/11/2012   Procedure: TENNIS ELBOW RELEASE;  Surgeon: [name], MD;  Location: [facility];  Service: Orthopedics;  Laterality: Right;  RIGHT ELBOW: TENOTOMY ELBOW LATERAL EPICONDYLITIS TENNIS ELBOW: RADIAL TUNNEL RELEASE  . PORT-A-CATH REMOVAL  04/13/2016   Procedure: MINOR REMOVAL PORT-A-CATH;  Surgeon: [name], MD;  Location: [facility];  Service: General;;  . PORTACATH PLACEMENT Right 04/05/2016   Procedure: INSERTION PORT-A-CATH WITH [place];  Surgeon: [name], MD;  Location: [facility];  Service: General;  Laterality: Right;  right IJ  . TOTAL THYROIDECTOMY  2001  . TUBAL LIGATION  94  . UPPER GI ENDOSCOPY       OB History   No obstetric history on file.     Family History  Problem Relation Age of Onset  . Heart attack Mother   . Cancer Mother        breast  . Breast cancer Mother 69  . Hypertension Sister   . Hypertension Brother   . Cancer Maternal Aunt        breast  . Breast cancer Maternal Aunt   . Cancer Paternal Grandmother        possible cancer, unknown type  . Stroke Neg Hx   . Diabetes Neg Hx     Social History   Tobacco Use  . Smoking status: Never Smoker  . Smokeless tobacco: Never Used  Substance Use Topics  . Alcohol use: No  . Drug use: No    Home Medications Prior to Admission medications   Medication Sig Start Date End Date Taking? Authorizing Provider  albuterol (VENTOLIN HFA) 108 (90 Base) MCG/ACT inhaler Inhale 2 puffs into the lungs every 6 (six) hours as needed for wheezing  or shortness of breath. 10/07/19   Terald Sleeper, PA-C  amoxicillin (AMOXIL) 500 MG capsule Take 2 capsules (1,000 mg total) by mouth 2 (two) times daily. 10/28/19   Terald Sleeper, PA-C  aspirin EC 81 MG tablet Take 1 tablet (81 mg total) by mouth daily. 07/04/17   Richardson Dopp T, PA-C  carvedilol (COREG) 6.25 MG tablet Take 0.5 tablets (3.125 mg total) by mouth 2 (two) times daily. 09/10/19   Burtis Junes, NP  dicyclomine (BENTYL) 10 MG capsule Take 10 mg by mouth as directed.    [provider]  ENTRESTO 24-26 MG Take 1 tablet by mouth twice daily 04/09/19   Dorothy Spark, MD  fluticasone [facility]) 50 MCG/ACT nasal spray Place 2 sprays into both nostrils daily.    [provider]  furosemide (  LASIX) 20 MG tablet Take 1 tablet by mouth once daily 07/12/19   Charlie Pitter, PA-C  levothyroxine (SYNTHROID) 88 MCG tablet TAKE 1 TABLET BY MOUTH  DAILY 07/02/19   Terald Sleeper, PA-C  omeprazole (PRILOSEC) 40 MG capsule Take 40 mg by mouth daily.    [provider]  predniSONE (DELTASONE) 20 MG tablet Take 1 tablet (20 mg total) by mouth daily with breakfast. 10/07/19   Terald Sleeper, PA-C  traMADol (ULTRAM) 50 MG tablet Take 50 mg by mouth daily as needed. 08/27/19   [provider]    Allergies    Avelox [moxifloxacin hcl in nacl], Clindamycin/lincomycin, Kiwi extract, Bactrim [sulfamethoxazole-trimethoprim], Levofloxacin, Moxifloxacin, Spironolactone, and Sulfa antibiotics  Review of Systems   Review of Systems  Respiratory: Positive for shortness of breath.   All other systems reviewed and are negative.   Physical Exam Updated Vital Signs BP 109/71 (BP Location: Right Arm)   Pulse 99   Temp (!) 102.4 F (39.1 C) (Oral)   Resp (!) 24   Ht 5\' 8"  (1.727 m)   Wt 93.9 kg   SpO2 (!) 89%   BMI 31.47 kg/m   Physical Exam Vitals and nursing note reviewed.   59 year old female, resting comfortably and in no acute distress. Vital signs are  significant for elevated temperature and respiratory rate. Oxygen saturation is 89%, which is hypoxic. Head is normocephalic and atraumatic. PERRLA, EOMI. Oropharynx is clear. Neck is nontender and supple without adenopathy or JVD. Back is nontender and there is no CVA tenderness. Lungs are clear without rales, wheezes, or rhonchi. Chest is nontender. Heart has regular rate and rhythm without murmur. Abdomen is soft, flat, nontender without masses or hepatosplenomegaly and peristalsis is normoactive. Extremities have no cyanosis or edema, full range of motion is present. Skin is warm and dry without rash. Neurologic: Mental status is normal, cranial nerves are intact, there are no motor or sensory deficits.  ED Results / Procedures / Treatments   Labs (all labs ordered are listed, but only abnormal results are displayed) Labs Reviewed  COMPREHENSIVE METABOLIC PANEL - Abnormal; Notable for the following components:      Result Value   Sodium 134 (*)    Glucose, Bld 101 (*)    Calcium 8.8 (*)    Albumin 3.4 (*)    All other components within normal limits  CBC WITH DIFFERENTIAL/PLATELET - Abnormal; Notable for the following components:   RBC 3.77 (*)    Hemoglobin 11.3 (*)    HCT 35.5 (*)    All other components within normal limits  APTT - Abnormal; Notable for the following components:   aPTT 41 (*)    All other components within normal limits  D-DIMER, QUANTITATIVE (NOT AT [facility]) - Abnormal; Notable for the following components:   D-Dimer, Quant 2.46 (*)    All other components within normal limits  C-REACTIVE PROTEIN - Abnormal; Notable for the following components:   CRP 8.5 (*)    All other components within normal limits  FIBRINOGEN - Abnormal; Notable for the following components:   Fibrinogen 714 (*)    All other components within normal limits  LACTATE DEHYDROGENASE - Abnormal; Notable for the following components:   LDH 324 (*)    All other components within normal  limits  CULTURE, BLOOD (ROUTINE X 2)  CULTURE, BLOOD (ROUTINE X 2)  URINE CULTURE  LACTIC ACID, PLASMA  LACTIC ACID, PLASMA  PROTIME-INR  FERRITIN  PROCALCITONIN  URINALYSIS, ROUTINE W REFLEX MICROSCOPIC  HIV ANTIBODY (ROUTINE TESTING W REFLEX)  ABO/RH    EKG EKG Interpretation  Date/Time:  Thursday October 31 2019 00:41:13 EST Ventricular Rate:  97 PR Interval:    QRS Duration: 96 QT Interval:  320 QTC Calculation: 407 R Axis:   21 Text Interpretation: Sinus rhythm Low voltage, precordial leads Borderline T abnormalities, anterior leads Baseline wander in lead(s) V4 When compared with ECG of 09/17/2019, No significant change was found Confirmed by Delora Fuel [id number]) on 10/31/2019 2:34:29 AM   Radiology DG Chest Port 1 View  Result Date: 10/31/2019 CLINICAL DATA:  59 year old female with cough. Positive COVID-19 EXAM: PORTABLE CHEST 1 VIEW COMPARISON:  Chest radiograph dated 09/10/2019. FINDINGS: Bilateral upper lobe and perihilar faint confluent densities most consistent with multifocal pneumonia. Clinical correlation is recommended. There is no pleural effusion or pneumothorax. Stable cardiac silhouette. Left pectoral AICD device. No acute osseous pathology. IMPRESSION: Findings most consistent with multifocal pneumonia. Clinical correlation is recommended. Electronically Signed   By: Anner Crete M.D.   On: 10/31/2019 02:02    Procedures Procedures   Medications Ordered in ED Medications  cefTRIAXone (ROCEPHIN) 2 g in sodium chloride 0.9 % 100 mL IVPB (0 g Intravenous Stopped 10/31/19 0316)  azithromycin (ZITHROMAX) 500 mg in sodium chloride 0.9 % 250 mL IVPB (500 mg Intravenous New Bag/Given 10/31/19 0317)  acetaminophen (TYLENOL) tablet 650 mg (650 mg Oral Given 10/31/19 0123)  sodium chloride 0.9 % bolus 1,000 mL (0 mLs Intravenous Stopped 10/31/19 0227)    ED Course  I have reviewed the triage vital signs and the nursing notes.  Pertinent labs & imaging results that  were available during my care of the patient were reviewed by me and considered in my medical decision making (see chart for details).  MDM Rules/Calculators/A&P COVID-19 illness with dyspnea and fatigue.  Although triage oxygen saturation was 89%, oxygen saturations were 92-94% while I was in the room evaluating the patient.  Because of fever, sepsis protocol was initiated, but will hold off on antibiotics.  Old records are reviewed, and she does have a prior visit November 2020 for dyspnea and elevated D-dimer with negative CT angiogram.  Chest x-ray is consistent with developing multifocal pneumonia, consistent with COVID-19 infection.  Labs show mild anemia which is unchanged from baseline, mild hyponatremia which is not clinically significant.  She is started on antibiotics for community-acquired pneumonia.  Lactic acid level is normal.  She was ambulated without oxygen and did not demonstrate any oxygen saturation.  She is therefore felt to be safe for discharge home.  She is discharged with prescription for doxycycline.  Elevated LDH, CRP, D-dimer felt to be secondary to her COVID-19 infection.  She is given strict return precautions should she develop worsening dyspnea.  [name] [name] was evaluated in Emergency Department on 10/31/2019 for the symptoms described in the history of present illness. She was evaluated in the context of the global COVID-19 pandemic, which necessitated consideration that the patient might be at risk for infection with the SARS-CoV-2 virus that causes COVID-19. Institutional protocols and algorithms that pertain to the evaluation of patients at risk for COVID-19 are in a state of rapid change based on information released by regulatory bodies including the CDC and federal and state organizations. These policies and algorithms were followed during the patient's care in the ED. LDH is elevated, also consistent with COVID-19 infection.  Final Clinical Impression(s) / ED  Diagnoses Final diagnoses:  Pneumonia due to  COVID-19 virus  Normochromic normocytic anemia    Rx / DC Orders ED Discharge Orders         Ordered    doxycycline (VIBRAMYCIN) 100 MG capsule  2 times daily     10/31/19 [id number]           Delora Fuel, MD [id number] [phone]

## 2019-11-01 ENCOUNTER — Other Ambulatory Visit (HOSPITAL_COMMUNITY)
Admission: RE | Admit: 2019-11-01 | Discharge: 2019-11-01 | Disposition: A | Discharge status: Home / Self Care | Payer: Self-pay | Source: Ambulatory Visit | Attending: Internal Medicine | Admitting: Internal Medicine

## 2019-11-01 LAB — URINE CULTURE: Culture: NO GROWTH

## 2019-11-02 DIAGNOSIS — I5022 Chronic systolic (congestive) heart failure: Secondary | ICD-10-CM

## 2019-11-02 DIAGNOSIS — Z1152 Encounter for screening for COVID-19: Secondary | ICD-10-CM

## 2019-11-02 DIAGNOSIS — Z9189 Other specified personal risk factors, not elsewhere classified: Secondary | ICD-10-CM

## 2019-11-02 DIAGNOSIS — Z95810 Presence of automatic (implantable) cardiac defibrillator: Secondary | ICD-10-CM

## 2019-11-02 DIAGNOSIS — I428 Other cardiomyopathies: Secondary | ICD-10-CM

## 2019-11-02 DIAGNOSIS — I5043 Acute on chronic combined systolic (congestive) and diastolic (congestive) heart failure: Secondary | ICD-10-CM

## 2019-11-04 ENCOUNTER — Telehealth (HOSPITAL_COMMUNITY): Payer: Self-pay | Admitting: *Deleted

## 2019-11-04 ENCOUNTER — Encounter (HOSPITAL_COMMUNITY): Payer: BC Managed Care – PPO

## 2019-11-04 NOTE — Telephone Encounter (Signed)
Patient scheduled for CPX 11/04/2019 at 2pm. Patient messaged Dr. [name] that she tested positive for COVID-19 on 10/30/2019. I contacted patient to inform her of policy and provide guidance for rescheduling of CPX in 20 days from positive result. She can be rescheduled on/after 11/19/2019, if patient is asymptomatic. Patient understood the process and policy and agreed to call the office when she is feeling better after 11/19/2019.    Landis Martins, MS, ACSM-RCEP Clinical Exercise Physiologist

## 2019-11-05 ENCOUNTER — Encounter: Payer: Self-pay | Admitting: Physician Assistant

## 2019-11-05 ENCOUNTER — Telehealth: Payer: Self-pay | Admitting: *Deleted

## 2019-11-05 NOTE — Telephone Encounter (Addendum)
[name] Spark, MD  [name], LPN  I am so sorry that you are not feeling well! Please watch your symptoms closely and let us know if you have any new dyspnea or worsening LE edema, palpitations.   I would recommend that you undergo an echo 2 weeks from your positive Covid test   I would postpone cardiopulmonary test and CHF clinic appointment till early February    I hope you feel better soon.   [name] with the pt and informed her that per Dr. [name], she should watch her symptoms closely and let us know if she has any new dyspnea or worsening LEE, palpitations.  Also informed the pt that Dr. [name] recommends that she undergo an echo 2 weeks from your positive covid test.  Informed the pt that I will place the order for the echo in the system and send a message to our [facility] schedulers to call her back and arrange her echo appt, and reschedule both her CPX and CHF Clinic appt for early Feb.  Pt verbalized understanding and agrees with this plan.

## 2019-11-05 NOTE — Telephone Encounter (Signed)
Pt is calling and requesting that Dr. [name] write her a letter to continue to be out of work, while she has COVID and awaiting on her cardiac test to be completed for this.  Pt is ordered to have an echo done in 2 weeks, as advised by Dr. [name], for follow-up of recent diagnosis of positive covid test on 10/30/19.  Pt states she is also going to reach out to her PCP as well for this.  Pt states she is to return to work starting tomorrow, but still has active symptoms. Pt states whichever MD takes her out of work while she recovers from this, will need to fill out FMLA forms, when her work faxes them accordingly.  Informed the pt that Dr. [name] is out of the office this week, but I will forward this request to her for further review and recommendation.  Informed the pt that I will follow-up with her shortly thereafter, once further advise provided.  Pt verbalized understanding and agrees with this plan.

## 2019-11-05 NOTE — Telephone Encounter (Signed)
pt needs echo in 2 weeks, and reschedule her CPX and CHF clinic appt for sometime in Feb due to [place]: Today Message Contents  Jerlyn Ly, LPN   Cc: Scarlette Calico, RN  Heather,  Need a appointment for feb for CHF and can you have Scheduler gave me a call to reschedule CPX test.  [phone]. Thanks.

## 2019-11-05 NOTE — Telephone Encounter (Signed)
To Whom It May Concern,  Mrs [name] [name] is currently under my care. In addition to advanced heart failure, she is now having active Covid 19 infection with symptoms.   I would recommend to stay away from work till 11/25/2019 when she will undergo extensive cardiac testing.  Please call us with any questions.  Ena Dawley, MD Cardiologist

## 2019-11-05 NOTE — Telephone Encounter (Signed)
Pts echo is scheduled for 11/15/19 at 2:50 pm.  Her new CHF Clinic is rescheduled for 12/11/19.  Awaiting reschedule of CPX test.  Pt made aware of appt dates and times.

## 2019-11-06 ENCOUNTER — Encounter: Payer: Self-pay | Admitting: *Deleted

## 2019-11-06 LAB — CULTURE, BLOOD (ROUTINE X 2)
Culture: NO GROWTH
Culture: NO GROWTH
Special Requests: ADEQUATE
Special Requests: ADEQUATE

## 2019-11-06 NOTE — Telephone Encounter (Signed)
Spoke with the pt and informed her that her work note written on behalf of Dr. [name], was written and sent to her active mychart account. Pt is also requesting a hard copy of this letter to be picked up at the office tomorrow.  Informed the pt that I will print this off and place this downstairs with our screening/registration team, so that she can have someone come and pick this up tomorrow.  Pt verbalized understanding and agrees with this plan. Pt was more than gracious for all the assistance provided.

## 2019-11-06 NOTE — Telephone Encounter (Signed)
Letter written and sent to the pts PCP and pt via her active mychart account.

## 2019-11-07 ENCOUNTER — Encounter: Payer: Self-pay | Admitting: *Deleted

## 2019-11-07 NOTE — Telephone Encounter (Signed)
Thanks Cyril Mourning, appt rescheduled to Feb.

## 2019-11-07 NOTE — Progress Notes (Signed)
Letter printed and will be left downstairs for the pts family member to pick up, due to pt having covid at this time.

## 2019-11-07 NOTE — Telephone Encounter (Signed)
RE: pt needs echo in 2 weeks, and reschedule her CPX and CHF clinic appt for sometime in Feb due to [place]: Today Message Contents  Jerlyn Ly, LPN  Spoke with CHF - the info has been seen to the [facility] that is doing the CPX - she will call the patient to schedule the test.

## 2019-11-11 ENCOUNTER — Encounter (HOSPITAL_COMMUNITY): Payer: BC Managed Care – PPO | Admitting: Internal Medicine

## 2019-11-13 LAB — CUP PACEART REMOTE DEVICE CHECK
Battery Remaining Longevity: 90 mo
Battery Remaining Percentage: 100 %
Brady Statistic RA Percent Paced: 0 %
Brady Statistic RV Percent Paced: 0 %
Date Time Interrogation Session: 20210120103800
HighPow Impedance: 48 Ohm
Implantable Lead Implant Date: 20090320
Implantable Lead Implant Date: 20090320
Implantable Lead Location: 753859
Implantable Lead Location: 753860
Implantable Lead Model: 158
Implantable Lead Model: 5076
Implantable Lead Serial Number: 182504
Implantable Pulse Generator Implant Date: 20160307
Lead Channel Impedance Value: 453 Ohm
Lead Channel Impedance Value: 508 Ohm
Lead Channel Pacing Threshold Amplitude: 0.4 V
Lead Channel Pacing Threshold Amplitude: 0.9 V
Lead Channel Pacing Threshold Pulse Width: 0.4 ms
Lead Channel Pacing Threshold Pulse Width: 0.4 ms
Lead Channel Setting Pacing Amplitude: 2 V
Lead Channel Setting Pacing Amplitude: 2.5 V
Lead Channel Setting Pacing Pulse Width: 0.4 ms
Lead Channel Setting Sensing Sensitivity: 0.6 mV
Pulse Gen Serial Number: 111826

## 2019-11-14 ENCOUNTER — Ambulatory Visit (INDEPENDENT_AMBULATORY_CARE_PROVIDER_SITE_OTHER): Payer: BC Managed Care – PPO | Admitting: *Deleted

## 2019-11-14 DIAGNOSIS — I5022 Chronic systolic (congestive) heart failure: Secondary | ICD-10-CM | POA: Diagnosis not present

## 2019-11-14 LAB — CUP PACEART REMOTE DEVICE CHECK
Battery Remaining Longevity: 90 mo
Battery Remaining Percentage: 100 %
Brady Statistic RA Percent Paced: 0 %
Brady Statistic RV Percent Paced: 0 %
Date Time Interrogation Session: 20210121073500
HighPow Impedance: 51 Ohm
Implantable Lead Implant Date: 20090320
Implantable Lead Implant Date: 20090320
Implantable Lead Location: 753859
Implantable Lead Location: 753860
Implantable Lead Model: 158
Implantable Lead Model: 5076
Implantable Lead Serial Number: 182504
Implantable Pulse Generator Implant Date: 20160307
Lead Channel Impedance Value: 486 Ohm
Lead Channel Impedance Value: 492 Ohm
Lead Channel Pacing Threshold Amplitude: 0.4 V
Lead Channel Pacing Threshold Amplitude: 0.9 V
Lead Channel Pacing Threshold Pulse Width: 0.4 ms
Lead Channel Pacing Threshold Pulse Width: 0.4 ms
Lead Channel Setting Pacing Amplitude: 2 V
Lead Channel Setting Pacing Amplitude: 2.5 V
Lead Channel Setting Pacing Pulse Width: 0.4 ms
Lead Channel Setting Sensing Sensitivity: 0.6 mV
Pulse Gen Serial Number: 111826

## 2019-11-15 ENCOUNTER — Ambulatory Visit (HOSPITAL_COMMUNITY): Payer: BC Managed Care – PPO | Attending: Cardiology

## 2019-11-15 ENCOUNTER — Other Ambulatory Visit: Payer: Self-pay

## 2019-11-15 DIAGNOSIS — Z9189 Other specified personal risk factors, not elsewhere classified: Secondary | ICD-10-CM | POA: Diagnosis not present

## 2019-11-15 DIAGNOSIS — Z1152 Encounter for screening for COVID-19: Secondary | ICD-10-CM

## 2019-11-15 DIAGNOSIS — I5043 Acute on chronic combined systolic (congestive) and diastolic (congestive) heart failure: Secondary | ICD-10-CM

## 2019-11-15 DIAGNOSIS — I428 Other cardiomyopathies: Secondary | ICD-10-CM

## 2019-11-15 DIAGNOSIS — I5022 Chronic systolic (congestive) heart failure: Secondary | ICD-10-CM | POA: Diagnosis not present

## 2019-11-15 DIAGNOSIS — Z95810 Presence of automatic (implantable) cardiac defibrillator: Secondary | ICD-10-CM

## 2019-11-15 MED ORDER — PERFLUTREN LIPID MICROSPHERE
1.0000 mL | INTRAVENOUS | Status: AC | PRN
  Administered 2019-11-15: 2 mL via INTRAVENOUS

## 2019-11-18 ENCOUNTER — Telehealth: Payer: Self-pay

## 2019-11-18 NOTE — Telephone Encounter (Signed)
The patient has been notified of the Echo result and verbalized understanding.  All questions (if any) were answered. Frederik Schmidt, RN 11/18/2019 8:57 AM   The patient would like an extended leave from work letter.  Please advise, thank you

## 2019-11-18 NOTE — Telephone Encounter (Signed)
-----   Message from [name], LPN sent at [id number]  5:31 PM EST -----  ----- Message ----- From: Dorothy Spark, MD Sent: 11/15/2019   4:25 PM EST To: [name], LPN  Her LVEF remains severely decreased at 25-30%. Plesae make sure she is taking her meds, she is also scheduled to see Dr [name] on 12/11/2019.

## 2019-11-19 ENCOUNTER — Encounter: Payer: Self-pay | Admitting: *Deleted

## 2019-11-19 NOTE — Telephone Encounter (Signed)
Dr. [name], pt needs another extended work excuse letter to provide her Employer.  We are still working her up post covid.  She is to see CHF clinic in Feb, along with having them do a CPX on her.  She just recently had an echo done by you, with reduced LVEF. Please advise.

## 2019-11-19 NOTE — Telephone Encounter (Signed)
Sent mychart message to the pt that Dr. [name] wrote her another work excuse letter, and this will be available to pick up downstairs at the office on 11/20/19.  Also informed her that this letter is available in her mychart as well to print off.

## 2019-11-19 NOTE — Telephone Encounter (Signed)
To Whom it may concern,  [name] is currently under my care and has scheduled follow-up visit in [facility] clinic on December 11, 2019, it is advised that she does not return back to work until her follow-up visit.  Please call us with any questions,  Ena Dawley, MD   Letter created and pt is aware this will be available for pickup at the office, tomorrow.

## 2019-11-19 NOTE — Progress Notes (Signed)
Letter written and pt is aware that it will be available for pick-up tomorrow at our office.

## 2019-11-19 NOTE — Telephone Encounter (Signed)
To Whom it may concern,  [name] is currently under my care and has scheduled follow-up visit in [facility] on December 11, 2019, it is advised that she does not return back to work until her follow-up visit.  Please call us with any questions,  Ena Dawley, MD

## 2019-11-19 NOTE — Telephone Encounter (Signed)
I thought I wrote her letter to go to work till she is seen in CHF clinic.

## 2019-11-19 NOTE — Telephone Encounter (Signed)
Dr. [name] you did, but it expired and her CHF clinic appt go pushed back to a later date due to covid.  She will need an updated one.

## 2019-11-20 ENCOUNTER — Encounter: Payer: Self-pay | Admitting: *Deleted

## 2019-11-20 NOTE — Telephone Encounter (Signed)
Work excuse letter written and will be taken downstairs for the pt to pick up at her earliest convenience.  Pt aware letter is available to pick-up.

## 2019-11-25 ENCOUNTER — Encounter: Payer: Self-pay | Admitting: Physician Assistant

## 2019-12-11 ENCOUNTER — Other Ambulatory Visit: Payer: Self-pay

## 2019-12-11 ENCOUNTER — Ambulatory Visit (HOSPITAL_COMMUNITY)
Admission: RE | Admit: 2019-12-11 | Discharge: 2019-12-11 | Disposition: A | Discharge status: Home / Self Care | Payer: BC Managed Care – PPO | Source: Ambulatory Visit | Attending: Internal Medicine | Admitting: Internal Medicine

## 2019-12-11 ENCOUNTER — Encounter (HOSPITAL_COMMUNITY): Payer: Self-pay | Admitting: Internal Medicine

## 2019-12-11 ENCOUNTER — Telehealth (HOSPITAL_COMMUNITY): Payer: Self-pay | Admitting: *Deleted

## 2019-12-11 VITALS — BP 142/88 | HR 76 | Ht 68.0 in | Wt 216.0 lb

## 2019-12-11 DIAGNOSIS — E039 Hypothyroidism, unspecified: Secondary | ICD-10-CM | POA: Diagnosis not present

## 2019-12-11 DIAGNOSIS — Z881 Allergy status to other antibiotic agents status: Secondary | ICD-10-CM | POA: Diagnosis not present

## 2019-12-11 DIAGNOSIS — Z95810 Presence of automatic (implantable) cardiac defibrillator: Secondary | ICD-10-CM | POA: Insufficient documentation

## 2019-12-11 DIAGNOSIS — Z7982 Long term (current) use of aspirin: Secondary | ICD-10-CM | POA: Diagnosis not present

## 2019-12-11 DIAGNOSIS — Z79899 Other long term (current) drug therapy: Secondary | ICD-10-CM | POA: Diagnosis not present

## 2019-12-11 DIAGNOSIS — I428 Other cardiomyopathies: Secondary | ICD-10-CM | POA: Insufficient documentation

## 2019-12-11 DIAGNOSIS — Z882 Allergy status to sulfonamides status: Secondary | ICD-10-CM | POA: Diagnosis not present

## 2019-12-11 DIAGNOSIS — I10 Essential (primary) hypertension: Secondary | ICD-10-CM | POA: Diagnosis not present

## 2019-12-11 DIAGNOSIS — R0683 Snoring: Secondary | ICD-10-CM | POA: Insufficient documentation

## 2019-12-11 DIAGNOSIS — Z8673 Personal history of transient ischemic attack (TIA), and cerebral infarction without residual deficits: Secondary | ICD-10-CM | POA: Diagnosis not present

## 2019-12-11 DIAGNOSIS — Z8616 Personal history of COVID-19: Secondary | ICD-10-CM | POA: Insufficient documentation

## 2019-12-11 DIAGNOSIS — I110 Hypertensive heart disease with heart failure: Secondary | ICD-10-CM | POA: Insufficient documentation

## 2019-12-11 DIAGNOSIS — E785 Hyperlipidemia, unspecified: Secondary | ICD-10-CM | POA: Diagnosis not present

## 2019-12-11 DIAGNOSIS — Z79890 Hormone replacement therapy (postmenopausal): Secondary | ICD-10-CM | POA: Insufficient documentation

## 2019-12-11 DIAGNOSIS — Z8249 Family history of ischemic heart disease and other diseases of the circulatory system: Secondary | ICD-10-CM | POA: Insufficient documentation

## 2019-12-11 DIAGNOSIS — Z853 Personal history of malignant neoplasm of breast: Secondary | ICD-10-CM | POA: Diagnosis not present

## 2019-12-11 DIAGNOSIS — I5022 Chronic systolic (congestive) heart failure: Secondary | ICD-10-CM | POA: Insufficient documentation

## 2019-12-11 DIAGNOSIS — Z923 Personal history of irradiation: Secondary | ICD-10-CM | POA: Insufficient documentation

## 2019-12-11 DIAGNOSIS — Z803 Family history of malignant neoplasm of breast: Secondary | ICD-10-CM | POA: Insufficient documentation

## 2019-12-11 MED ORDER — ENTRESTO 49-51 MG PO TABS: 1.0000 | ORAL_TABLET | Freq: Two times a day (BID) | ORAL | 6 refills | Status: DC

## 2019-12-11 NOTE — Telephone Encounter (Signed)
Faxed letter through epic to [facility] [place] (fax # [phone])  As requested.

## 2019-12-11 NOTE — Patient Instructions (Signed)
INCREASE Entresto to 49/51 mg, one tab twice a day  Your physician has recommended that you have a cardiopulmonary stress test (CPX). CPX testing is a non-invasive measurement of heart and lung function. It replaces a traditional treadmill stress test. This type of test provides a tremendous amount of information that relates not only to your present condition but also for future outcomes. This test combines measurements of you ventilation, respiratory gas exchange in the lungs, electrocardiogram (EKG), blood pressure and physical response before, during, and following an exercise protocol.  Your provider has recommended that you have a home sleep study.  [employer] is the company that does these test.  They will contact you by phone and must speak with you before they can ship the equipment.  Once they have spoken with you they will send the equipment right to your home with instructions on how to set it up.  Once you have completed the test you just dispose of the equipment, the information is automatically uploaded to [place] via blue-tooth technology.  IF you have any questions or issues with the equipment please call the company directly at [phone].  If your test is positive for sleep apnea and you need a home CPAP machine you will be contacted by Dr Theodosia Blender office [facility]) to set this up.  Your physician recommends that you schedule a follow-up appointment in: 4-6 weeks with Dr [name]  Do the following things EVERYDAY: 1) Weigh yourself in the morning before breakfast. Write it down and keep it in a log. 2) Take your medicines as prescribed 3) Eat low salt foods--Limit salt (sodium) to 2000 mg per day.  4) Stay as active as you can everyday 5) Limit all fluids for the day to less than 2 liters  At the [facility], you and your health needs are our priority. As part of our continuing mission to provide you with exceptional heart care, we have created designated  Provider Care Teams. These Care Teams include your primary Cardiologist (physician) and Advanced Practice Providers (APPs- Physician Assistants and Nurse Practitioners) who all work together to provide you with the care you need, when you need it.   You may see any of the following providers on your designated Care Team at your next follow up: [name] Dr Glori Bickers . Dr Loralie Champagne . Darrick Grinder, NP . Lyda Jester, PA . Audry Riles, PharmD   Please be sure to bring in all your medications bottles to every appointment.

## 2019-12-11 NOTE — Progress Notes (Signed)
CSW informed by CMA that pt has some financial concerns.  Pt reports that she has had to stop working due to medical concerns and that it has been difficult for her to get by with rent/utilities/etc.  Pt does not have any immediate concerns but is just worried about if she is unable to go back to work soon- was already provided with financial resources by [employer]   CSW provided active listening and encouraged her to call me if she is unable to pay bills or afford food in the future so we could further assist.  CSW will continue to follow through clinic and assist as needed  Jorge Ny, [facility] Desk#: [phone] Cell#: [phone]

## 2019-12-11 NOTE — Progress Notes (Signed)
ADVANCED HF CLINIC CONSULT NOTE  Referring Physician: Dr. [name] Primary Cardiologist: Dr. [name]  HPI:  59 y.o.female with breast cancer (s/p lumpectomy and XRT - refused chemo), hypothyroidism referred by Dr. [name] for further evaluation of systolic HF due to NICM. EF ~30%   She has long h/o NICM dating back to 2007. Cath in 2017 with normal cors and EF 25-30%. S/p s-ICD. Medical therapy has been limited by multiple medication intolerances (Spironolactone caused body burning sensation, could not afford Bidil and had fatigue with high does of Entresto).   Recently had COVID PNA in 1/21. Was treated at home. Her mother passed from [place].   Last echo 1/21 EF 25-30% RV normal   Says she is doing ok. Still with some SOB/DOE which she relates to [place]. Before COVID says she was getting more and more fatigued. Unable to continue working as she has a fairly strenuous/active job. No CP, edema, orthopnea or PND. She snores and wakes herself up. Can go to store but has a handicap sticker because she "needs to be closer"   On Entresto 24/26 bid, lasix 20, carvedilol 3.125 bid.    Review of Systems: [y] = yes, [ ]  = no   General: Weight gain [ ] ; Weight loss [ ] ; Anorexia [ ] ; Fatigue Blue.Reese ]; Fever [ ] ; Chills [ ] ; Weakness [ ]   Cardiac: Chest pain/pressure [ ] ; Resting SOB [ ] ; Exertional SOB Blue.Reese ]; Orthopnea [ ] ; Pedal Edema [ ] ; Palpitations [ ] ; Syncope [ ] ; Presyncope [ ] ; Paroxysmal nocturnal dyspnea[ ]   Pulmonary: Cough [ ] ; Wheezing[ ] ; Hemoptysis[ ] ; Sputum [ ] ; Snoring Blue.Reese ]  GI: Vomiting[ ] ; Dysphagia[ ] ; Melena[ ] ; Hematochezia [ ] ; Heartburn[ ] ; Abdominal pain [ ] ; Constipation [ ] ; Diarrhea [ ] ; BRBPR [ ]   GU: Hematuria[ ] ; Dysuria [ ] ; Nocturia[ ]   Vascular: Pain in legs with walking [ ] ; Pain in feet with lying flat [ ] ; Non-healing sores [ ] ; Stroke [ ] ; TIA [ ] ; Slurred speech [ ] ;  Neuro: Headaches[ ] ; Vertigo[ ] ; Seizures[ ] ; Paresthesias[ ] ;Blurred vision [ ] ; Diplopia [ ] ;  Vision changes [ ]   Ortho/Skin: Arthritis Blue.Reese ]; Joint pain Blue.Reese ]; Muscle pain [ ] ; Joint swelling [ ] ; Back Pain [ ] ; Rash [ ]   Psych: Depression[ ] ; Anxiety[ ]   Heme: Bleeding problems [ ] ; Clotting disorders [ ] ; Anemia [ ]   Endocrine: Diabetes [ ] ; Thyroid dysfunction[ ]    Past Medical History:  Diagnosis Date  . AICD (automatic cardioverter/defibrillator) present 09   [employer]- replaced 2016  . Arthritis   . Bradycardia   . Breast cancer ([place]) 02/24/2016   s/p lumpectomy and radiation but refused chemotherapy  . Chronic systolic CHF (congestive heart failure) ([place])   . Cough due to angiotensin-converting enzyme inhibitor    But patient chooses to continue ACE  . Dizziness    Dizziness with standing after squatting, March, 2012  . Dyslipidemia    LDL elevation,Patient does not want statin  . Family history of breast cancer   . GERD (gastroesophageal reflux disease)   . H/O shortness of breath    CPX 10/18:  No evidence of cardiopulmonary limitation. Suspect exercise intolerance related to weight and deconditioning.   [name] Hypothyroidism   . ICD (implantable cardiac defibrillator) battery depletion    Dr. [name], June, 2009(artifactual atrial tachycardia response events addressed by reprogramming in parentheses  . Leg swelling    right leg  . Nonischemic cardiomyopathy ([place])  Etiology unknown, diagnosed 2008, patient has never had catheterization or nuclear study as of March, 2011  . Personal history of radiation therapy 2017  . TIA (transient ischemic attack)    No CT or MRI abnormality, aspirin therapy    Current Outpatient Medications  Medication Sig Dispense Refill  . albuterol (VENTOLIN HFA) 108 (90 Base) MCG/ACT inhaler Inhale 2 puffs into the lungs every 6 (six) hours as needed for wheezing or shortness of breath. 18 g 0  . aspirin EC 81 MG tablet Take 1 tablet (81 mg total) by mouth daily. 90 tablet 3  . carvedilol (COREG) 6.25 MG tablet Take 0.5 tablets  (3.125 mg total) by mouth 2 (two) times daily. 180 tablet 2  . dicyclomine (BENTYL) 10 MG capsule Take 10 mg by mouth as directed.    [name] ENTRESTO 24-26 MG Take 1 tablet by mouth twice daily 60 tablet 9  . fluticasone (FLONASE) 50 MCG/ACT nasal spray Place 2 sprays into both nostrils daily.    . furosemide (LASIX) 20 MG tablet Take 1 tablet by mouth once daily 90 tablet 2  . levothyroxine (SYNTHROID) 88 MCG tablet TAKE 1 TABLET BY MOUTH  DAILY 90 tablet 3  . omeprazole (PRILOSEC) 40 MG capsule Take 40 mg by mouth daily.     No current facility-administered medications for this encounter.    Allergies  Allergen Reactions  . Avelox [Moxifloxacin Hcl In Nacl] Shortness Of Breath    Shortness of breath  . Clindamycin/Lincomycin Shortness Of Breath and Diarrhea    Breathing/diarrhea    . Kiwi Extract Anaphylaxis    Makes pt. Feel like her throat is closing   . Bactrim [Sulfamethoxazole-Trimethoprim] Swelling  . Levofloxacin Other (See Comments)    JOINT PAIN JOINT PAIN  . Moxifloxacin Other (See Comments)    Breathing problems Breathing problems  . Spironolactone Other (See Comments)    burning  . Sulfa Antibiotics Swelling      Social History   Socioeconomic History  . Marital status: Widowed    Spouse name: Not on file  . Number of children: Not on file  . Years of education: Not on file  . Highest education level: Not on file  Occupational History  . Occupation: Widow  Tobacco Use  . Smoking status: Never Smoker  . Smokeless tobacco: Never Used  Substance and Sexual Activity  . Alcohol use: No  . Drug use: No  . Sexual activity: Not on file  Other Topics Concern  . Not on file  Social History Narrative  . Not on file   Social Determinants of Health   Financial Resource Strain: Medium Risk  . Difficulty of Paying Living Expenses: Somewhat hard  Food Insecurity:   . Worried About Charity fundraiser in the Last Year: Not on file  . Ran Out of Food in the Last  Year: Not on file  Transportation Needs: No Transportation Needs  . Lack of Transportation (Medical): No  . Lack of Transportation (Non-Medical): No  Physical Activity: Insufficiently Active  . Days of Exercise per Week: 1 day  . Minutes of Exercise per Session: 10 min  Stress:   . Feeling of Stress : Not on file  Social Connections:   . Frequency of Communication with Friends and Family: Not on file  . Frequency of Social Gatherings with Friends and Family: Not on file  . Attends Religious Services: Not on file  . Active Member of Clubs or Organizations: Not on file  .  Attends Archivist Meetings: Not on file  . Marital Status: Not on file  Intimate Partner Violence:   . Fear of Current or Ex-Partner: Not on file  . Emotionally Abused: Not on file  . Physically Abused: Not on file  . Sexually Abused: Not on file      Family History  Problem Relation Age of Onset  . Heart attack Mother   . Cancer Mother        breast  . Breast cancer Mother 82  . Hypertension Sister   . Hypertension Brother   . Cancer Maternal Aunt        breast  . Breast cancer Maternal Aunt   . Cancer Paternal Grandmother        possible cancer, unknown type  . Stroke Neg Hx   . Diabetes Neg Hx     Vitals:   12/11/19 1147  BP: (!) 142/88  Pulse: 76  SpO2: 98%  Weight: 98 kg (216 lb)  Height: 5\' 8"  (1.727 m)    PHYSICAL EXAM: General:  Obese woman. No respiratory difficulty HEENT: normal Neck: supple. no JVD. Carotids 2+ bilat; no bruits. No lymphadenopathy or thryomegaly appreciated. Cor: PMI nondisplaced. Regular rate & rhythm. No rubs, gallops or murmurs. Lungs: clear Abdomen: obese soft, nontender, nondistended. No hepatosplenomegaly. No bruits or masses. Good bowel sounds. Extremities: no cyanosis, clubbing, rash, edema Neuro: alert & oriented x 3, cranial nerves grossly intact. moves all 4 extremities w/o difficulty. Affect pleasant.  ECG 10/31/19: NSR 97 QRS 89ms nonspecific  TW flattening Personally reviewed   ASSESSMENT & PLAN:  1. Chronic systolic HF - longstanding NICM dating back to 2007. Unclear etiology - Cath in 2017 with normal cors and EF 25-30%. S/p s-ICD - Echo 1/21 EF 25-30% RV normal  - NYHA III - Volume status ok. Continue lasix 20 daily - GDMT limited by multiple medication intolerances (Spironolactone caused body burning sensation, could not afford Bidil and had fatigue with high does of Entresto).  - Will attempt to titrate Entresto to 49/51 bid - Continue carvedilol 3.125 bid - Consider Farxiga or hydral/nitrates at next visit - Proceed with CPX to get better idea about functional capacity and for prognostication  2. HTN - BP remains high  - Increasing Entresto as above  3. Snoring - Will get sleep study  Glori Bickers, MD  12:18 PM

## 2019-12-11 NOTE — Progress Notes (Signed)
Patient Name:  [name] [name]         DOB: 07/18/1961      Height: 5 8    Weight: 216  Office Name:[facility]         Referring Provider:Bensimhon  Today's Date: 12/11/19  Date:   STOP BANG RISK ASSESSMENT S (snore) Have you been told that you snore?     YES   T (tired) Are you often tired, fatigued, or sleepy during the day?   YES  O (obstruction) Do you stop breathing, choke, or gasp during sleep? YES   P (pressure) Do you have or are you being treated for high blood pressure? NO   B (BMI) Is your body index greater than 35 kg/m? NO   A (age) Are you 10 years old or older? YES   N (neck) Do you have a neck circumference greater than 16 inches?   NO   G (gender) Are you a female? NO   TOTAL STOP/BANG "YES" ANSWERS                                                                        For Office Use Only              Procedure Order Form    YES to 3+ Stop Bang questions OR two clinical symptoms - patient qualifies for WatchPAT (CPT 95800)     Submit: This Form + Patient Face Sheet + Clinical Note via CloudPAT or Fax: [phone]         Clinical Notes: Will consult Sleep Specialist and refer for management of therapy due to patient increased risk of Sleep Apnea. Ordering a sleep study due to the following two clinical symptoms: Excessive daytime sleepiness G47.10  Loud snoring R06.83  I understand that I am proceeding with a home sleep apnea test as ordered by my treating physician. I understand that untreated sleep apnea is a serious cardiovascular risk factor and it is my responsibility to perform the test and seek management for sleep apnea. I will be contacted with the results and be managed for sleep apnea by a local sleep physician. I will be receiving equipment and further instructions from [facility]. I shall promptly ship back the equipment via the included mailing label. I understand my insurance will be billed for the test and as the patient I am  responsible for any insurance related out-of-pocket costs incurred. I have been provided with written instructions and can call for additional video or telephonic instruction, with 24-hour availability of qualified personnel to answer any questions: Patient Help Desk [phone].  Patient Signature ______________________________________________________   Date______________________ Patient Telemedicine Verbal Consent

## 2019-12-12 ENCOUNTER — Encounter (HOSPITAL_COMMUNITY): Payer: Self-pay

## 2019-12-12 NOTE — Telephone Encounter (Signed)
Pts CPX is scheduled for 12/19/19 at 1100.  Pt made aware of appt date and time by CHF clinic.

## 2019-12-13 ENCOUNTER — Telehealth (HOSPITAL_COMMUNITY): Payer: Self-pay | Admitting: Cardiology

## 2019-12-13 ENCOUNTER — Encounter (HOSPITAL_COMMUNITY): Payer: Self-pay

## 2019-12-13 NOTE — Telephone Encounter (Signed)
Order, OV note, stop bang and demographics all faxed to [employer] at [phone] via fax machine

## 2019-12-16 ENCOUNTER — Other Ambulatory Visit: Payer: Self-pay

## 2019-12-16 ENCOUNTER — Other Ambulatory Visit (HOSPITAL_COMMUNITY)
Admission: RE | Admit: 2019-12-16 | Discharge: 2019-12-16 | Disposition: A | Discharge status: Home / Self Care | Payer: BC Managed Care – PPO | Source: Ambulatory Visit | Attending: Internal Medicine | Admitting: Internal Medicine

## 2019-12-16 DIAGNOSIS — Z20822 Contact with and (suspected) exposure to covid-19: Secondary | ICD-10-CM | POA: Insufficient documentation

## 2019-12-16 DIAGNOSIS — Z01812 Encounter for preprocedural laboratory examination: Secondary | ICD-10-CM | POA: Insufficient documentation

## 2019-12-16 LAB — SARS CORONAVIRUS 2 (TAT 6-24 HRS): SARS Coronavirus 2: NEGATIVE

## 2019-12-17 ENCOUNTER — Telehealth: Payer: Self-pay

## 2019-12-17 NOTE — Telephone Encounter (Signed)
I spoke to the patient who would like to drop off her application for a handicap card and have Dr [name] complete at her convenience.  I told her that we would pass it along to Dr [name].  She verbalized understanding.

## 2019-12-19 ENCOUNTER — Ambulatory Visit (HOSPITAL_COMMUNITY): Payer: BC Managed Care – PPO | Attending: Internal Medicine

## 2019-12-19 ENCOUNTER — Other Ambulatory Visit (HOSPITAL_COMMUNITY): Payer: Self-pay | Admitting: *Deleted

## 2019-12-19 ENCOUNTER — Other Ambulatory Visit: Payer: Self-pay

## 2019-12-19 DIAGNOSIS — I5022 Chronic systolic (congestive) heart failure: Secondary | ICD-10-CM

## 2019-12-19 DIAGNOSIS — R0602 Shortness of breath: Secondary | ICD-10-CM | POA: Diagnosis not present

## 2019-12-19 DIAGNOSIS — E669 Obesity, unspecified: Secondary | ICD-10-CM | POA: Diagnosis not present

## 2019-12-19 DIAGNOSIS — Z6832 Body mass index (BMI) 32.0-32.9, adult: Secondary | ICD-10-CM | POA: Insufficient documentation

## 2019-12-25 ENCOUNTER — Telehealth: Payer: Self-pay | Admitting: Cardiology

## 2019-12-25 DIAGNOSIS — R1011 Right upper quadrant pain: Secondary | ICD-10-CM | POA: Diagnosis not present

## 2019-12-25 DIAGNOSIS — R1012 Left upper quadrant pain: Secondary | ICD-10-CM | POA: Diagnosis not present

## 2019-12-25 DIAGNOSIS — R1010 Upper abdominal pain, unspecified: Secondary | ICD-10-CM | POA: Diagnosis not present

## 2019-12-25 NOTE — Telephone Encounter (Signed)
[name] [name] is returning [name] phone call regarding results.

## 2019-12-25 NOTE — Telephone Encounter (Signed)
See CPX results

## 2019-12-26 ENCOUNTER — Telehealth: Payer: Self-pay | Admitting: *Deleted

## 2019-12-26 NOTE — Telephone Encounter (Signed)
Pts handicap placard form was filled out and signed by Dr. [name]. Pt is aware that she can come pick this up or I can mail this to her. Pt prefers having this mailed to her.  Mailing address confirmed.  Pt verbalized understanding and agrees with this plan. Pt appreciative for all the assistance provided.

## 2019-12-27 ENCOUNTER — Other Ambulatory Visit: Payer: Self-pay | Admitting: Cardiology

## 2020-01-02 ENCOUNTER — Ambulatory Visit: Payer: BC Managed Care – PPO | Attending: Internal Medicine

## 2020-01-02 DIAGNOSIS — Z23 Encounter for immunization: Secondary | ICD-10-CM

## 2020-01-02 NOTE — Progress Notes (Signed)
   Covid-19 Vaccination Clinic  Name:  KEAIRA DIVELBISS    MRN: [id number] DOB: 1961-01-18  01/02/2020  Ms. Vanduyne was observed post Covid-19 immunization for 30 minutes based on pre-vaccination screening without incident. She was provided with Vaccine Information Sheet and instruction to access the V-Safe system.   Ms. Kirgan was instructed to call 911 with any severe reactions post vaccine: [name] Difficulty breathing  . Swelling of face and throat  . A fast heartbeat  . A bad rash all over body  . Dizziness and weakness   Immunizations Administered    Name Date Dose VIS Date Route   Moderna COVID-19 Vaccine 01/02/2020  9:52 AM 0.5 mL 09/24/2019 Intramuscular   Manufacturer: Moderna   Lot: [id number]   [id number]

## 2020-01-03 DIAGNOSIS — R1010 Upper abdominal pain, unspecified: Secondary | ICD-10-CM | POA: Diagnosis not present

## 2020-01-03 DIAGNOSIS — K760 Fatty (change of) liver, not elsewhere classified: Secondary | ICD-10-CM | POA: Diagnosis not present

## 2020-01-12 ENCOUNTER — Other Ambulatory Visit: Payer: Self-pay | Admitting: Cardiology

## 2020-01-15 DIAGNOSIS — R1013 Epigastric pain: Secondary | ICD-10-CM | POA: Diagnosis not present

## 2020-01-15 DIAGNOSIS — K5641 Fecal impaction: Secondary | ICD-10-CM | POA: Diagnosis not present

## 2020-01-15 DIAGNOSIS — R1010 Upper abdominal pain, unspecified: Secondary | ICD-10-CM | POA: Diagnosis not present

## 2020-01-23 ENCOUNTER — Encounter (HOSPITAL_COMMUNITY): Payer: Self-pay | Admitting: Internal Medicine

## 2020-01-23 ENCOUNTER — Ambulatory Visit (HOSPITAL_COMMUNITY)
Admission: RE | Admit: 2020-01-23 | Discharge: 2020-01-23 | Disposition: A | Discharge status: Home / Self Care | Payer: BC Managed Care – PPO | Source: Ambulatory Visit | Attending: Internal Medicine | Admitting: Internal Medicine

## 2020-01-23 ENCOUNTER — Other Ambulatory Visit: Payer: Self-pay

## 2020-01-23 VITALS — BP 124/82 | HR 99 | Wt 224.2 lb

## 2020-01-23 DIAGNOSIS — I428 Other cardiomyopathies: Secondary | ICD-10-CM | POA: Insufficient documentation

## 2020-01-23 DIAGNOSIS — Z79899 Other long term (current) drug therapy: Secondary | ICD-10-CM | POA: Insufficient documentation

## 2020-01-23 DIAGNOSIS — Z7982 Long term (current) use of aspirin: Secondary | ICD-10-CM | POA: Insufficient documentation

## 2020-01-23 DIAGNOSIS — E785 Hyperlipidemia, unspecified: Secondary | ICD-10-CM | POA: Diagnosis not present

## 2020-01-23 DIAGNOSIS — I110 Hypertensive heart disease with heart failure: Secondary | ICD-10-CM | POA: Diagnosis not present

## 2020-01-23 DIAGNOSIS — E039 Hypothyroidism, unspecified: Secondary | ICD-10-CM | POA: Diagnosis not present

## 2020-01-23 DIAGNOSIS — R9431 Abnormal electrocardiogram [ECG] [EKG]: Secondary | ICD-10-CM | POA: Diagnosis not present

## 2020-01-23 DIAGNOSIS — R0683 Snoring: Secondary | ICD-10-CM | POA: Diagnosis not present

## 2020-01-23 DIAGNOSIS — Z853 Personal history of malignant neoplasm of breast: Secondary | ICD-10-CM | POA: Insufficient documentation

## 2020-01-23 DIAGNOSIS — Z95810 Presence of automatic (implantable) cardiac defibrillator: Secondary | ICD-10-CM | POA: Insufficient documentation

## 2020-01-23 DIAGNOSIS — I5022 Chronic systolic (congestive) heart failure: Secondary | ICD-10-CM

## 2020-01-23 DIAGNOSIS — Z79890 Hormone replacement therapy (postmenopausal): Secondary | ICD-10-CM | POA: Diagnosis not present

## 2020-01-23 DIAGNOSIS — I10 Essential (primary) hypertension: Secondary | ICD-10-CM | POA: Diagnosis not present

## 2020-01-23 DIAGNOSIS — Z8616 Personal history of COVID-19: Secondary | ICD-10-CM | POA: Insufficient documentation

## 2020-01-23 MED ORDER — ENTRESTO 97-103 MG PO TABS: 1.0000 | ORAL_TABLET | Freq: Two times a day (BID) | ORAL | 6 refills | Status: DC

## 2020-01-23 MED ORDER — FUROSEMIDE 20 MG PO TABS: 20.0000 mg | ORAL_TABLET | Freq: Every day | ORAL | 2 refills | Status: DC | PRN

## 2020-01-23 NOTE — Progress Notes (Signed)
ADVANCED HF CLINIC NOTE  Referring Physician: Dr. [name] Primary Cardiologist: Dr. [name]  HPI:  59 y.o.female with breast cancer (s/p lumpectomy and XRT - refused chemo), hypothyroidism referred by Dr. [name] for further evaluation of systolic HF due to NICM. EF ~30%   She has long h/o NICM dating back to 2007. Cath in 2017 with normal cors and EF 25-30%. S/p s-ICD. Medical therapy has been limited by multiple medication intolerances (Spironolactone caused body burning sensation, could not afford Bidil and had fatigue with high does of Entresto).   Recently had COVID PNA in 1/21. Was treated at home. Her mother passed from [place].   Last echo 1/21 EF 25-30% RV normal   I saw her for first visit in 2/21. Was feeling well. Entresto increased to 49/51/bid  We got CPX which showed normal functional capacity.   CPX 12/19/19 FVC 2.75 (87%)    FEV1 2.14 (86%)     FEV1/FVC 78 (98%)     MVV 105 (104%)   Resting HR: 85 Standing HR: 86 Peak HR: 161  (100% age predicted max HR)  BP rest: 128/84 Standing BP: 116/82 BP peak: 166/72  Peak VO2: 17.7 (97%% predicted peak VO2)  - adjusted for ibw peak VO2 is 24.7 ml/kg (ibw)/min VE/VCO2 slope: 29  Peak RER: 1.08  O2pulse: 11  (100% predicted O2pulse)   Here for f/u. Doing pretty well. Can do all activities without too much problem but still gets SOB when goes up hills. No edema, orthopnea or PND. Since COVID says occasionally her body will just take a deep breath involuntarily. No cough. Has a very physical job and wants to go back to work.    Past Medical History:  Diagnosis Date  . AICD (automatic cardioverter/defibrillator) present 09   [employer]- replaced 2016  . Arthritis   . Bradycardia   . Breast cancer ([place]) 02/24/2016   s/p lumpectomy and radiation but refused chemotherapy  . Chronic systolic CHF (congestive heart failure) ([place])   . Cough due to angiotensin-converting enzyme inhibitor    But patient chooses  to continue ACE  . Dizziness    Dizziness with standing after squatting, March, 2012  . Dyslipidemia    LDL elevation,Patient does not want statin  . Family history of breast cancer   . GERD (gastroesophageal reflux disease)   . H/O shortness of breath    CPX 10/18:  No evidence of cardiopulmonary limitation. Suspect exercise intolerance related to weight and deconditioning.   [name] Hypothyroidism   . ICD (implantable cardiac defibrillator) battery depletion    Dr. [name], June, 2009(artifactual atrial tachycardia response events addressed by reprogramming in parentheses  . Leg swelling    right leg  . Nonischemic cardiomyopathy ([place])    Etiology unknown, diagnosed 2008, patient has never had catheterization or nuclear study as of March, 2011  . Personal history of radiation therapy 2017  . TIA (transient ischemic attack)    No CT or MRI abnormality, aspirin therapy    Current Outpatient Medications  Medication Sig Dispense Refill  . albuterol (VENTOLIN HFA) 108 (90 Base) MCG/ACT inhaler Inhale 2 puffs into the lungs every 6 (six) hours as needed for wheezing or shortness of breath. 18 g 0  . amitriptyline (ELAVIL) 10 MG tablet Take 10 mg by mouth at bedtime.     [name] aspirin EC 81 MG tablet Take 1 tablet (81 mg total) by mouth daily. 90 tablet 3  . carvedilol (COREG) 6.25 MG tablet Take 0.5 tablets (  3.125 mg total) by mouth 2 (two) times daily. 90 tablet 2  . dicyclomine (BENTYL) 10 MG capsule Take 10 mg by mouth as directed.    . fluticasone (FLONASE) 50 MCG/ACT nasal spray Place 2 sprays into both nostrils daily.    . furosemide (LASIX) 20 MG tablet Take 1 tablet by mouth once daily 90 tablet 2  . levothyroxine (SYNTHROID) 88 MCG tablet TAKE 1 TABLET BY MOUTH  DAILY 90 tablet 3  . omeprazole (PRILOSEC) 40 MG capsule Take 40 mg by mouth daily.    . sacubitril-valsartan (ENTRESTO) 49-51 MG Take 1 tablet by mouth 2 (two) times daily. 60 tablet 6   No current facility-administered  medications for this encounter.    Allergies  Allergen Reactions  . Avelox [Moxifloxacin Hcl In Nacl] Shortness Of Breath    Shortness of breath  . Clindamycin/Lincomycin Shortness Of Breath and Diarrhea    Breathing/diarrhea    . Kiwi Extract Anaphylaxis    Makes pt. Feel like her throat is closing   . Bactrim [Sulfamethoxazole-Trimethoprim] Swelling  . Levofloxacin Other (See Comments)    JOINT PAIN JOINT PAIN  . Moxifloxacin Other (See Comments)    Breathing problems Breathing problems  . Simvastatin Other (See Comments)    Joint pain  . Spironolactone Other (See Comments)    burning  . Sulfa Antibiotics Swelling      Social History   Socioeconomic History  . Marital status: Widowed    Spouse name: Not on file  . Number of children: Not on file  . Years of education: Not on file  . Highest education level: Not on file  Occupational History  . Occupation: Widow  Tobacco Use  . Smoking status: Never Smoker  . Smokeless tobacco: Never Used  Substance and Sexual Activity  . Alcohol use: No  . Drug use: No  . Sexual activity: Not on file  Other Topics Concern  . Not on file  Social History Narrative  . Not on file   Social Determinants of Health   Financial Resource Strain: Medium Risk  . Difficulty of Paying Living Expenses: Somewhat hard  Food Insecurity:   . Worried About Charity fundraiser in the Last Year:   . Arboriculturist in the Last Year:   Transportation Needs: No Transportation Needs  . Lack of Transportation (Medical): No  . Lack of Transportation (Non-Medical): No  Physical Activity: Insufficiently Active  . Days of Exercise per Week: 1 day  . Minutes of Exercise per Session: 10 min  Stress:   . Feeling of Stress :   Social Connections:   . Frequency of Communication with Friends and Family:   . Frequency of Social Gatherings with Friends and Family:   . Attends Religious Services:   . Active Member of Clubs or Organizations:   .  Attends Archivist Meetings:   [name] Marital Status:   Intimate Partner Violence:   . Fear of Current or Ex-Partner:   . Emotionally Abused:   [name] Physically Abused:   . Sexually Abused:       Family History  Problem Relation Age of Onset  . Heart attack Mother   . Cancer Mother        breast  . Breast cancer Mother 5  . Hypertension Sister   . Hypertension Brother   . Cancer Maternal Aunt        breast  . Breast cancer Maternal Aunt   . Cancer Paternal Grandmother  possible cancer, unknown type  . Stroke Neg Hx   . Diabetes Neg Hx     Vitals:   01/23/20 1340  BP: 124/82  Pulse: 99  SpO2: 99%  Weight: 101.7 kg (224 lb 3.2 oz)    PHYSICAL EXAM: General:  Well appearing. No resp difficulty HEENT: normal Neck: supple. no JVD. Carotids 2+ bilat; no bruits. No lymphadenopathy or thryomegaly appreciated. Cor: PMI nondisplaced. Regular rate & rhythm. No rubs, gallops or murmurs. Lungs: clear Abdomen: obese soft, nontender, nondistended. No hepatosplenomegaly. No bruits or masses. Good bowel sounds. Extremities: no cyanosis, clubbing, rash, edema Neuro: alert & orientedx3, cranial nerves grossly intact. moves all 4 extremities w/o difficulty. Affect pleasant   ECG 01/23/20: NSR 89 QRS 23ms No ST-T wave abnormalities. Personally reviewed   ASSESSMENT & PLAN:  1. Chronic systolic HF - longstanding NICM dating back to 2007. Unclear etiology - Cath in 2017 with normal cors and EF 25-30%. S/p s-ICD - Echo 11/20 read as 35-40% (likely 30-35%)  - Echo 1/21 EF 25-30%  RV normal  (post-COVID) - NYHA III - CPX testing 2/21 with normal functional capacity when corrected for ibw. No obvious HF limitation - Volume status ok. Will stop lasix with increased Entresto. Can take as needed for swelling - GDMT limited by multiple medication intolerances (Spironolactone caused body burning sensation, could not afford Bidil  - Increase Entresto to 97/103 bid - Continue  carvedilol 3.125 bid - Consider Farxiga or hydral/nitrates at next visit  2. HTN - BP improved - watch BP as we increase Entresto  3. Snoring - Pending sleep study  Glori Bickers, MD  1:55 PM

## 2020-01-23 NOTE — Patient Instructions (Signed)
Increase Entresto to 97/103 mg Twice daily   Decrease Furosemide to AS NEEDED ONLY  Your physician recommends that you schedule a follow-up appointment in: 2-3 months  If you have any questions or concerns before your next appointment please send [place] a message through [place] or call our office at [phone].  At the [facility], you and your health needs are our priority. As part of our continuing mission to provide you with exceptional heart care, we have created designated Provider Care Teams. These Care Teams include your primary Cardiologist (physician) and Advanced Practice Providers (APPs- Physician Assistants and Nurse Practitioners) who all work together to provide you with the care you need, when you need it.   You may see any of the following providers on your designated Care Team at your next follow up: [name] Dr Glori Bickers . Dr Loralie Champagne . Darrick Grinder, NP . Lyda Jester, PA . Audry Riles, PharmD   Please be sure to bring in all your medications bottles to every appointment.

## 2020-01-23 NOTE — Addendum Note (Signed)
Encounter addended by: Scarlette Calico, RN on: 01/23/2020 2:20 PM  Actions taken: Letter saved

## 2020-01-23 NOTE — Addendum Note (Signed)
Encounter addended by: Scarlette Calico, RN on: 01/23/2020 2:14 PM  Actions taken: Order list changed, Clinical Note Signed

## 2020-01-30 DIAGNOSIS — M545 Low back pain: Secondary | ICD-10-CM | POA: Diagnosis not present

## 2020-02-04 ENCOUNTER — Ambulatory Visit: Payer: BC Managed Care – PPO | Attending: Internal Medicine

## 2020-02-04 DIAGNOSIS — Z23 Encounter for immunization: Secondary | ICD-10-CM

## 2020-02-04 NOTE — Progress Notes (Signed)
   Covid-19 Vaccination Clinic  Name:  [name] [name]    MRN: [id number] DOB: 1961-01-13  02/04/2020  [name] was observed post Covid-19 immunization for 15 minutes without incident. She was provided with Vaccine Information Sheet and instruction to access the V-Safe system.   [name] was instructed to call 911 with any severe reactions post vaccine: [name] Difficulty breathing  . Swelling of face and throat  . A fast heartbeat  . A bad rash all over body  . Dizziness and weakness   Immunizations Administered    Name Date Dose VIS Date Route   Moderna COVID-19 Vaccine 02/04/2020  8:36 AM 0.5 mL 09/24/2019 Intramuscular   Manufacturer: Moderna   Lot: [id number]   [id number]

## 2020-02-13 ENCOUNTER — Ambulatory Visit (INDEPENDENT_AMBULATORY_CARE_PROVIDER_SITE_OTHER): Payer: BC Managed Care – PPO | Admitting: *Deleted

## 2020-02-13 DIAGNOSIS — I5022 Chronic systolic (congestive) heart failure: Secondary | ICD-10-CM | POA: Diagnosis not present

## 2020-02-13 LAB — CUP PACEART REMOTE DEVICE CHECK
Battery Remaining Longevity: 90 mo
Battery Remaining Percentage: 100 %
Brady Statistic RA Percent Paced: 0 %
Brady Statistic RV Percent Paced: 0 %
Date Time Interrogation Session: 20210422000100
HighPow Impedance: 50 Ohm
Implantable Lead Implant Date: 20090320
Implantable Lead Implant Date: 20090320
Implantable Lead Location: 753859
Implantable Lead Location: 753860
Implantable Lead Model: 158
Implantable Lead Model: 5076
Implantable Lead Serial Number: 182504
Implantable Pulse Generator Implant Date: 20160307
Lead Channel Impedance Value: 516 Ohm
Lead Channel Impedance Value: 523 Ohm
Lead Channel Pacing Threshold Amplitude: 0.4 V
Lead Channel Pacing Threshold Amplitude: 0.9 V
Lead Channel Pacing Threshold Pulse Width: 0.4 ms
Lead Channel Pacing Threshold Pulse Width: 0.4 ms
Lead Channel Setting Pacing Amplitude: 2 V
Lead Channel Setting Pacing Amplitude: 2.5 V
Lead Channel Setting Pacing Pulse Width: 0.4 ms
Lead Channel Setting Sensing Sensitivity: 0.6 mV
Pulse Gen Serial Number: 111826

## 2020-02-14 NOTE — Progress Notes (Signed)
ICD Remote  

## 2020-02-17 ENCOUNTER — Encounter: Payer: Self-pay | Admitting: Physician Assistant

## 2020-02-17 NOTE — Progress Notes (Signed)
Cardiology Office Note    Date:  02/20/2020   ID:  [name] [name], [name] [name] 25-Aug-1961, MRN [id number]  PCP:  Janora Norlander, DO  Cardiologist:  Ena Dawley, MD / Adv HF - Dr. [name] Electrophysiologist:  [name], MD   Chief Complaint: f/u CHF  History of Present Illness:   [name] [name] is a 59 y.o. female with history of longstanding NICM (LVEF last 25-35% by cath [id number]), chronic systolic CHF, prior [employer] ICD (replaced 2016), normal coronary arteries on cath 07/2016, breast CA (s/p lumpectomy and radiation but refused chemotherapy), dyslipidemia, moderate MR, GERD, hypothyroidism, prior TIA who presents for f/u. She has been followed by Dr. [name] historically then more recently Dr. [name] as well. Medical therapy has been limited by multiple medication intolerances (Spironolactone caused body burning sensation, could not afford Bidil and had fatigue with high does of Entresto). She had COVID PNA in 1/21. Was treated at home. Her mother passed from [place]. Last echo 10/2019 showed EF 25-30%, grade 1 DD, global HK, mildly dilated LV, moderate MR, mild TR. CPX 11/2019 was normal. She saw Dr. [name] 01/23/20 and was doing well. Entresto was increased and Lasix was changed to PRN. Sleep study was ordered and is pending (snoring). Last labs personally reviewed 10/2019 Hgb 11.3, K 4.2, Na 134, Cr 0.93, albumin 3.4, AST/ALT OK, 08/2019 TSH wnl, 04/2019 LDL 127.  She returns for follow-up today doing well. It took her a while to recover from Covid. Her breathing is much better. She does report the odd sensation ever since Covid that her body prompts her to take a deep breath a few times a day. She has no edema or orthopnea. She is having orthopedic issues requiring cortisone injections which has limited her physical activity. As a result she's been out of work again. She thinks this has contributed to some gradual body weight gain (224lb at time of Dr. Letha Cape visit, 226lb  today). She had many excellent questions about HF classification and seeing NSVT on her device interrogation report on MyChart.    Past Medical History:  Diagnosis Date  . AICD (automatic cardioverter/defibrillator) present 09   [employer]- replaced 2016  . Arthritis   . Bradycardia   . Breast cancer ([place]) 02/24/2016   s/p lumpectomy and radiation but refused chemotherapy  . Chronic systolic CHF (congestive heart failure) ([place])   . Cough due to angiotensin-converting enzyme inhibitor   . Dizziness    Dizziness with standing after squatting, March, 2012  . Dyslipidemia    LDL elevation,Patient does not want statin  . Family history of breast cancer   . GERD (gastroesophageal reflux disease)   . H/O shortness of breath    CPX 10/18:  No evidence of cardiopulmonary limitation. Suspect exercise intolerance related to weight and deconditioning.   [name] Hypothyroidism   . ICD (implantable cardiac defibrillator) battery depletion    Dr. [name], June, 2009(artifactual atrial tachycardia response events addressed by reprogramming in parentheses  . Leg swelling    right leg  . Mitral regurgitation   . Nonischemic cardiomyopathy ([place])    Etiology unknown, diagnosed 2008  . Personal history of radiation therapy 2017  . Pneumonia due to COVID-19 virus 10/2019  . TIA (transient ischemic attack)    No CT or MRI abnormality, aspirin therapy    Past Surgical History:  Procedure Laterality Date  . BREAST BIOPSY Left 02/24/2016   U/S Core  . BREAST BIOPSY Left 02/24/2016   U/S Core  .  BREAST LUMPECTOMY Left 04/05/2016   invasive ductal   . BREAST LUMPECTOMY WITH RADIOACTIVE SEED AND SENTINEL LYMPH NODE BIOPSY Left 04/05/2016   Procedure: BREAST LUMPECTOMY WITH RADIOACTIVE SEED AND SENTINEL LYMPH NODE BIOPSY;  Surgeon: [name], MD;  Location: [facility];  Service: General;  Laterality: Left;  . CARDIAC CATHETERIZATION N/A 08/05/2016   Procedure: Left Heart Cath and Coronary Angiography;   Surgeon: Peter M Martinique, MD;  Location: [facility];  Service: Cardiovascular;  Laterality: N/A;  . CARDIAC DEFIBRILLATOR PLACEMENT  09   [employer] no remote  . COLONOSCOPY    . CYST EXCISION Left    back of leg-lt   . IMPLANTABLE CARDIOVERTER DEFIBRILLATOR GENERATOR CHANGE N/A 12/29/2014   Procedure: IMPLANTABLE CARDIOVERTER DEFIBRILLATOR GENERATOR CHANGE;  Surgeon: [name], MD;  Location: [facility];  Service: Cardiovascular;  Laterality: N/A;  . LATERAL EPICONDYLE RELEASE  10/11/2012   Procedure: TENNIS ELBOW RELEASE;  Surgeon: [name], MD;  Location: [facility];  Service: Orthopedics;  Laterality: Right;  RIGHT ELBOW: TENOTOMY ELBOW LATERAL EPICONDYLITIS TENNIS ELBOW: RADIAL TUNNEL RELEASE  . PORT-A-CATH REMOVAL  04/13/2016   Procedure: MINOR REMOVAL PORT-A-CATH;  Surgeon: [name], MD;  Location: [facility];  Service: General;;  . PORTACATH PLACEMENT Right 04/05/2016   Procedure: INSERTION PORT-A-CATH WITH [place];  Surgeon: [name], MD;  Location: [facility];  Service: General;  Laterality: Right;  right IJ  . TOTAL THYROIDECTOMY  2001  . TUBAL LIGATION  94  . UPPER GI ENDOSCOPY      Current Medications: Current Meds  Medication Sig  . albuterol (VENTOLIN HFA) 108 (90 Base) MCG/ACT inhaler Inhale 2 puffs into the lungs every 6 (six) hours as needed for wheezing or shortness of breath.  [name] amitriptyline (ELAVIL) 10 MG tablet Take 20 mg by mouth at bedtime.   [name] aspirin EC 81 MG tablet Take 1 tablet (81 mg total) by mouth daily.  . carvedilol (COREG) 6.25 MG tablet Take 0.5 tablets (3.125 mg total) by mouth 2 (two) times daily.  [name] dicyclomine (BENTYL) 10 MG capsule Take 10 mg by mouth as directed.  . fluticasone (FLONASE) 50 MCG/ACT nasal spray Place 2 sprays into both nostrils daily.  . furosemide (LASIX) 20 MG tablet Take 1 tablet (20 mg total) by mouth daily as needed.  [name] guaiFENesin (MUCINEX) 600 MG 12 hr tablet Take 600 mg  by mouth every 12 (twelve) hours.  [name] levothyroxine (SYNTHROID) 88 MCG tablet TAKE 1 TABLET BY MOUTH  DAILY  . omeprazole (PRILOSEC) 40 MG capsule Take 40 mg by mouth daily.  . sacubitril-valsartan (ENTRESTO) 97-103 MG Take 1 tablet by mouth 2 (two) times daily.      Allergies:   Avelox [moxifloxacin hcl in nacl], Clindamycin/lincomycin, Kiwi extract, Bactrim [sulfamethoxazole-trimethoprim], Levofloxacin, Moxifloxacin, Simvastatin, Spironolactone, and Sulfa antibiotics   Social History   Socioeconomic History  . Marital status: Widowed    Spouse name: Not on file  . Number of children: Not on file  . Years of education: Not on file  . Highest education level: Not on file  Occupational History  . Occupation: Widow  Tobacco Use  . Smoking status: Never Smoker  . Smokeless tobacco: Never Used  Substance and Sexual Activity  . Alcohol use: No  . Drug use: No  . Sexual activity: Not on file  Other Topics Concern  . Not on file  Social History Narrative  . Not on file   Social Determinants  of Health   Financial Resource Strain: Medium Risk  . Difficulty of Paying Living Expenses: Somewhat hard  Food Insecurity:   . Worried About Charity fundraiser in the Last Year:   . Arboriculturist in the Last Year:   Transportation Needs: No Transportation Needs  . Lack of Transportation (Medical): No  . Lack of Transportation (Non-Medical): No  Physical Activity: Insufficiently Active  . Days of Exercise per Week: 1 day  . Minutes of Exercise per Session: 10 min  Stress:   . Feeling of Stress :   Social Connections:   . Frequency of Communication with Friends and Family:   . Frequency of Social Gatherings with Friends and Family:   . Attends Religious Services:   . Active Member of Clubs or Organizations:   . Attends Archivist Meetings:   [name] Marital Status:      Family History:  The patient's family history includes Breast cancer in her maternal aunt; Breast cancer  (age of onset: 53) in her mother; Cancer in her maternal aunt, mother, and paternal grandmother; Heart attack in her mother; Hypertension in her brother and sister. There is no history of Stroke or Diabetes.  ROS:   Please see the history of present illness.  All other systems are reviewed and otherwise negative.    EKGs/Labs/Other Studies Reviewed:    Studies reviewed are outlined and summarized above. Reports included below if pertinent.  CPX 11/2019 Procedure: This patient underwent staged symptom-limited exercise treadmill testing using a modified Naughton protocol with expired gas analysis metabolic evaluation during exercise.   Demographics   Age: 89 Ht. (in.) 67 Wt. (lb) 214 BMI: 32.5   Predicted Peak VO2: 18.3   Gender: Female Ht (cm) 172.7 Wt. (kg) 97.1    Results   Pre-Exercise PFTs   FVC 2.75 (87%)     FEV1 2.14 (86%)      FEV1/FVC 78 (98%)      MVV 105 (104%)    Exercise Time:  11:00  Speed (mph): 3.0    Grade (%): 7.5    RPE: 17   Reason stopped: dyspnea (7/10)   Additional symptoms: None reported   Resting HR: 85 Standing HR: 86 Peak HR: 161  (100% age predicted max HR)   BP rest: 128/84 Standing BP: 116/82 BP peak: 166/72   Peak VO2: 17.7 (97%% predicted peak VO2)   VE/VCO2 slope: 29   OUES: 2.02   Peak RER: 1.08   Ventilatory Threshold: 13.0 (71% predicted or measured peak VO2)   Peak RR 38   Peak Ventilation: 56.8   VE/MVV: 54%   PETCO2 at peak: 38   O2pulse: 11  (100% predicted O2pulse)    Interpretation   Notes: Patient gave a very good effort. Pulse-oximetry remained 97-99% for the duration of exercise.   ECG: Resting ECG in sinus rhythm with occasional PVCs. HR response appropriate. There were frequent PVCs during exercise with no sustained arrhythmias or ST-T changes.   PFT: Pre-exercise spirometry was within normal limits. The MVV was normal.   CPX: Exercise testing with gas exchange  demonstrates a normal peak VO2 of 17.7 ml/kg/min (97% of the age/gender/weight matched sedentary norms). The RER of 1.08 indicates a near maximal effort. When adjusted to the patient's ideal body weight of 153.1 lb (69.5 kg) the peak VO2 is 24.7 ml/kg (ibw)/min (108% of the ibw-adjusted predicted). The VE/VCO2 slope is normal. The oxygen uptake efficiency slope (OUES) is normal. The VO2 at  the ventilatory threshold was normal at 71% of the predicted peak VO2. At peak exercise, the ventilation reached 54% of the measured MVV indicating ventilatory reserve remained. The O2pulse (a surrogate for stroke volume) increased with incremental exercise, reaching peak 11 ml/beat (100% predicted).    Conclusion: Exercise testing with gas exchange demonstrates normal functional capacity when compared to matched sedentary norms. There is no clear cardiopulmonary limitation. With corrections for ideal body weight, patient's body habitus appears the limiting factor. Overall CPX essentially unchanged from 2018.    Test, report and preliminary impression by:  Landis Martins, MS, ACSM-RCEP  12/19/2019 12:49 PM    Attending: Agree with above. Normal exercise test. No significant cardiopulmonary limitation to exercise noted despite significant LV dysfunction. Suspect obesity playing a role in her exertional symptoms. There were frequent PVCs during exercise. Consider evaluation for underlying sleep disordered breathing.   Glori Bickers, MD  10:39 PM   2D Echo 10/2019 1. Left ventricular ejection fraction, by visual estimation, is 25 to  30%. The left ventricle has severely decreased function. There is no left  ventricular hypertrophy.  2. Definity contrast agent was given IV to delineate the left ventricular  endocardial borders.  3. Elevated left atrial pressure.  4. Left ventricular diastolic parameters are consistent with Grade I  diastolic dysfunction (impaired relaxation).  5. Mildly dilated left  ventricular internal cavity size.  6. The left ventricle demonstrates global hypokinesis.  7. Global right ventricle has normal systolic function.The right  ventricular size is mildly enlarged.  8. Left atrial size was normal.  9. Right atrial size was normal.  10. The mitral valve is normal in structure. Moderate mitral valve  regurgitation. No evidence of mitral stenosis.  11. The tricuspid valve is normal in structure. Tricuspid valve  regurgitation is mild.  12. The aortic valve is tricuspid. Aortic valve regurgitation is not  visualized. No evidence of aortic valve sclerosis or stenosis.  13. The pulmonic valve was normal in structure. Pulmonic valve  regurgitation is not visualized.  14. A pacer wire is visualized.  15. The inferior vena cava is normal in size with greater than 50%  respiratory variability, suggesting right atrial pressure of 3 mmHg.  16. Definity used; severe global reduction in LV systolic function; grade  1 diastolic dysfunction; mild LVE; mild RVE; probable moderate MR (not  well assessed); mild TR.     EKG:  EKG is not ordered today but personally reviewed from 01/23/20 showing NSR 89bpm, cannot exclude prior anterior infarct, no acute STT changes  Recent Labs: 09/17/2019: NT-Pro BNP 130; TSH 1.640 09/18/2019: B Natriuretic Peptide 37.0 10/31/2019: ALT 15; BUN 15; Creatinine, Ser 0.93; Hemoglobin 11.3; Platelets 242; Potassium 4.2; Sodium 134  Recent Lipid Panel    Component Value Date/Time   CHOL 207 (H) 05/15/2019 1034   TRIG 81 05/15/2019 1034   HDL 64 05/15/2019 1034   CHOLHDL 3.2 05/15/2019 1034   LDLCALC 127 (H) 05/15/2019 1034    PHYSICAL EXAM:    VS:  BP 122/80   Pulse 76   Ht 5\' 8"  (1.727 m)   Wt 226 lb 12.8 oz (102.9 kg)   SpO2 97%   BMI 34.48 kg/m   BMI: Body mass index is 34.48 kg/m.  GEN: Well nourished, well developed AAF, in no acute distress HEENT: normocephalic, atraumatic Neck: no JVD, carotid bruits, or masses Cardiac:  RRR; no murmurs, rubs, or gallops, no edema  Respiratory:  clear to auscultation bilaterally, normal work of breathing GI:  soft, nontender, nondistended, + BS MS: no deformity or atrophy Skin: warm and dry, no rash Neuro:  Alert and Oriented x 3, Strength and sensation are intact, follows commands Psych: euthymic mood, full affect  Wt Readings from Last 3 Encounters:  02/20/20 226 lb 12.8 oz (102.9 kg)  01/23/20 224 lb 3.2 oz (101.7 kg)  12/11/19 216 lb (98 kg)     ASSESSMENT & PLAN:   1. NICM/Chronic systolic CHF - tolerating Entresto dose increase well. She is NYHA class II at this time. Continue present regimen. Check BMET given Entresto dose increase. Dr. [name] plans to consider Wilder Glade or hydral/nitrates at his next visit. She sees him in June. She has not needed to take her PRN Lasix. Regarding her weight gain we did discuss the need to adjust her calorie intake when she finds she's able to do less activity from an orthopedic standpoint. She does not appear volume overloaded. 2. H/o ICD - she has not had EP follow-up since 2019, will arrange. No ICD discharges reported. 3. Moderate mitral regurgitation - continue to follow clinically at this time. Most recent echo was 10/2019. 4. Essential HTN - blood pressure controlled today. Continue present regimen. 5. NSVT - seen on recent device interrogation. Dr. [name] did not recommend any new changes to device. Continue BB. Check TSH, Mg, K today.  Disposition: F/u with Dr. [name] in June as scheduled. Will request overdue EP f/u with Dr. [name]. Since CHF team has been primarily adjusting medications, will plan f/u 1 year with Dr. [name] in person.  Medication Adjustments/Labs and Tests Ordered: Current medicines are reviewed at length with the patient today.  Concerns regarding medicines are outlined above. Medication changes, Labs and Tests ordered today are summarized above and listed in the Patient Instructions accessible in  Encounters.   Signed, Charlie Pitter, PA-C  02/20/2020 11:11 AM    [address], [place], [place]  [address] Phone: [phone]; Fax: [phone]

## 2020-02-19 DIAGNOSIS — M461 Sacroiliitis, not elsewhere classified: Secondary | ICD-10-CM | POA: Diagnosis not present

## 2020-02-20 ENCOUNTER — Other Ambulatory Visit: Payer: Self-pay

## 2020-02-20 ENCOUNTER — Encounter: Payer: Self-pay | Admitting: Physician Assistant

## 2020-02-20 ENCOUNTER — Ambulatory Visit: Payer: BC Managed Care – PPO | Admitting: Physician Assistant

## 2020-02-20 VITALS — BP 122/80 | HR 76 | Ht 68.0 in | Wt 226.8 lb

## 2020-02-20 DIAGNOSIS — I5022 Chronic systolic (congestive) heart failure: Secondary | ICD-10-CM | POA: Diagnosis not present

## 2020-02-20 DIAGNOSIS — I428 Other cardiomyopathies: Secondary | ICD-10-CM | POA: Diagnosis not present

## 2020-02-20 DIAGNOSIS — I340 Nonrheumatic mitral (valve) insufficiency: Secondary | ICD-10-CM

## 2020-02-20 DIAGNOSIS — I10 Essential (primary) hypertension: Secondary | ICD-10-CM

## 2020-02-20 DIAGNOSIS — Z95810 Presence of automatic (implantable) cardiac defibrillator: Secondary | ICD-10-CM | POA: Diagnosis not present

## 2020-02-20 DIAGNOSIS — I472 Ventricular tachycardia: Secondary | ICD-10-CM

## 2020-02-20 DIAGNOSIS — I4729 Other ventricular tachycardia: Secondary | ICD-10-CM

## 2020-02-20 LAB — BASIC METABOLIC PANEL
BUN/Creatinine Ratio: 16 (ref 9–23)
BUN: 13 mg/dL (ref 6–24)
CO2: 21 mmol/L (ref 20–29)
Calcium: 9.3 mg/dL (ref 8.7–10.2)
Chloride: 106 mmol/L (ref 96–106)
Creatinine, Ser: 0.79 mg/dL (ref 0.57–1.00)
GFR calc Af Amer: 95 mL/min/{1.73_m2} (ref >59)
GFR calc non Af Amer: 82 mL/min/{1.73_m2} (ref >59)
Glucose: 121 mg/dL — ABNORMAL HIGH (ref 65–99)
Potassium: 4.3 mmol/L (ref 3.5–5.2)
Sodium: 140 mmol/L (ref 134–144)

## 2020-02-20 LAB — MAGNESIUM: Magnesium: 2.1 mg/dL (ref 1.6–2.3)

## 2020-02-20 LAB — TSH: TSH: 0.248 u[IU]/mL — ABNORMAL LOW (ref 0.450–4.500)

## 2020-02-20 NOTE — Patient Instructions (Signed)
Medication Instructions:  Your physician recommends that you continue on your current medications as directed. Please refer to the Current Medication list given to you today.  *If you need a refill on your cardiac medications before your next appointment, please call your pharmacy*   Lab Work: TODAY:  BMET, MAG, & TSH  If you have labs (blood work) drawn today and your tests are completely normal, you will receive your results only by: [name] MyChart Message (if you have MyChart) OR . A paper copy in the mail If you have any lab test that is abnormal or we need to change your treatment, we will call you to review the results.   Testing/Procedures: None ordered   Follow-Up: At [facility], you and your health needs are our priority.  As part of our continuing mission to provide you with exceptional heart care, we have created designated Provider Care Teams.  These Care Teams include your primary Cardiologist (physician) and Advanced Practice Providers (APPs -  Physician Assistants and Nurse Practitioners) who all work together to provide you with the care you need, when you need it.  We recommend signing up for the patient portal called "MyChart".  Sign up information is provided on this After Visit Summary.  MyChart is used to connect with patients for Virtual Visits (Telemedicine).  Patients are able to view lab/test results, encounter notes, upcoming appointments, etc.  Non-urgent messages can be sent to your provider as well.   To learn more about what you can do with MyChart, go to [email or web address].    Your next appointment:   12 month(s)  The format for your next appointment:   In Person  Provider:   You may see Ena Dawley, MD or one of the following Advanced Practice Providers on your designated Care Team:    Melina Copa, PA-C  Ermalinda Barrios, PA-C    Other Instructions You are overdue to see Dr. [name].  I will have someone call you to arrange an appointment.

## 2020-02-21 ENCOUNTER — Telehealth: Payer: Self-pay | Admitting: Family Medicine

## 2020-02-24 ENCOUNTER — Other Ambulatory Visit: Payer: Self-pay | Admitting: Family Medicine

## 2020-02-24 MED ORDER — LEVOTHYROXINE SODIUM 75 MCG PO TABS: 75.0000 ug | ORAL_TABLET | Freq: Every day | ORAL | 0 refills | Status: DC

## 2020-02-24 NOTE — Telephone Encounter (Signed)
Synthroid reduced to 25mcg.  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  Can you please get her an appt to see me in the next 4-6 weeks so we can repeat thyroid panel, est care?

## 2020-02-24 NOTE — Telephone Encounter (Signed)
Patient aware and apt scheduled.

## 2020-03-12 DIAGNOSIS — M545 Low back pain: Secondary | ICD-10-CM | POA: Diagnosis not present

## 2020-03-18 ENCOUNTER — Other Ambulatory Visit: Payer: Self-pay | Admitting: Adult Health

## 2020-03-18 DIAGNOSIS — Z9889 Other specified postprocedural states: Secondary | ICD-10-CM

## 2020-03-18 DIAGNOSIS — Z853 Personal history of malignant neoplasm of breast: Secondary | ICD-10-CM

## 2020-03-24 DIAGNOSIS — Z01419 Encounter for gynecological examination (general) (routine) without abnormal findings: Secondary | ICD-10-CM | POA: Diagnosis not present

## 2020-03-24 DIAGNOSIS — Z6834 Body mass index (BMI) 34.0-34.9, adult: Secondary | ICD-10-CM | POA: Diagnosis not present

## 2020-03-24 DIAGNOSIS — Z1151 Encounter for screening for human papillomavirus (HPV): Secondary | ICD-10-CM | POA: Diagnosis not present

## 2020-03-27 ENCOUNTER — Other Ambulatory Visit: Payer: Self-pay

## 2020-03-27 ENCOUNTER — Encounter: Payer: Self-pay | Admitting: Family Medicine

## 2020-03-27 ENCOUNTER — Ambulatory Visit: Payer: BC Managed Care – PPO | Admitting: Family Medicine

## 2020-03-27 VITALS — BP 104/75 | HR 95 | Temp 97.4°F | Ht 68.0 in | Wt 223.0 lb

## 2020-03-27 DIAGNOSIS — T20212A Burn of second degree of left ear [any part, except ear drum], initial encounter: Secondary | ICD-10-CM | POA: Diagnosis not present

## 2020-03-27 DIAGNOSIS — I5022 Chronic systolic (congestive) heart failure: Secondary | ICD-10-CM | POA: Diagnosis not present

## 2020-03-27 DIAGNOSIS — E890 Postprocedural hypothyroidism: Secondary | ICD-10-CM | POA: Diagnosis not present

## 2020-03-27 MED ORDER — MUPIROCIN 2 % EX OINT: 1.0000 "application " | TOPICAL_OINTMENT | Freq: Two times a day (BID) | CUTANEOUS | 0 refills | Status: DC
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
## 2020-03-27 NOTE — Patient Instructions (Addendum)
You had labs performed today.  You will be contacted with the results of the labs once they are available, usually in the next 3 business days for routine lab work.  If you have an active my chart account, they will be released to your MyChart.  If you prefer to have these labs released to you via telephone, please let us know.  If you had a pap smear or biopsy performed, expect to be contacted in about 7-10 days.  Burn Care, Adult A burn is an injury to the skin or the tissues under the skin. There are three types of burns:  First degree. These burns may cause the skin to be red and slightly swollen.  Second degree. These burns are very painful and cause the skin to be very red. The skin may also leak fluid, look shiny, and develop blisters.  Third degree. These burns cause permanent damage. They either turn the skin white or black and make it look charred, dry, and leathery. Taking care of your burn properly can help to prevent pain and infection. It can also help the burn to heal more quickly. What are the risks? Complications from burns include:  Damage to the skin.  Reduced blood flow near the injury.  Dead tissue.  Scarring.  Problems with movement, if the burn happened near a joint or on the hands or feet. Severe burns can lead to problems that affect the whole body, such as:  Fluid loss.  Less blood circulating in the body.  Inability to maintain a normal core body temperature (thermoregulation).  Infection.  Shock.  Problems breathing. How to care for a first-degree burn Right after a burn:  Rinse or soak the burn under cool water until the pain stops. Do not put ice on your burn. This can cause more damage.  Lightly cover the burn with a sterile cloth (dressing). Burn care  Follow instructions from your health care provider about: ? How to clean and take care of the burn. ? When to change and remove the dressing.  Check your burn every day for signs of  infection. Check for: ? More redness, swelling, or pain. ? Warmth. ? Pus or a bad smell. Medicine  Take over-the-counter and prescription medicines only as told by your health care provider.  If you were prescribed antibiotic medicine, take or apply it as told by your health care provider. Do not stop using the antibiotic even if your condition improves. General instructions  To prevent infection, do not put butter, oil, or other home remedies on your burn.  Do not rub your burn, even when you are cleaning it.  Protect your burn from the sun. How to care for a second-degree burn Right after a burn:  Rinse or soak the burn under cool water. Do this for several minutes. Do not put ice on your burn. This can cause more damage.  Lightly cover the burn with a sterile cloth (dressing). Burn care  Raise (elevate) the injured area above the level of your heart while sitting or lying down.  Follow instructions from your health care provider about: ? How to clean and take care of the burn. ? When to change and remove the dressing.  Check your burn every day for signs of infection. Check for: ? More redness, swelling, or pain. ? Warmth. ? Pus or a bad smell. Medicine   Take over-the-counter and prescription medicines only as told by your health care provider.  If you were prescribed  antibiotic medicine, take or apply it as told by your health care provider. Do not stop using the antibiotic even if your condition improves. General instructions  To prevent infection: ? Do not put butter, oil, or other home remedies on the burn. ? Do not scratch or pick at the burn. ? Do not break any blisters. ? Do not peel skin.  Do not rub your burn, even when you are cleaning it.  Protect your burn from the sun. How to care for a third-degree burn Right after a burn:  Lightly cover the burn with gauze.  Seek immediate medical attention. Burn care  Raise (elevate) the injured area  above the level of your heart while sitting or lying down.  Drink enough fluid to keep your urine clear or pale yellow.  Rest as told by your health care provider. Do not participate in sports or other physical activities until your health care provider approves.  Follow instructions from your health care provider about: ? How to clean and take care of the burn. ? When to change and remove the dressing.  Check your burn every day for signs of infection. Check for: ? More redness, swelling, or pain. ? Warmth. ? Pus or a bad smell. Medicine  Take over-the-counter and prescription medicines only as told by your health care provider.  If you were prescribed antibiotic medicine, take or apply it as told by your health care provider. Do not stop using the antibiotic even if your condition improves. General instructions  To prevent infection: ? Do not put butter, oil, or other home remedies on the burn. ? Do not scratch or pick at the burn. ? Do not break any blisters. ? Do not peel skin. ? Do not rub your burn, even when you are cleaning it.  Protect your burn from the sun.  Keep all follow-up visits as told by your health care provider. This is important. Contact a health care provider if:  Your condition does not improve.  Your condition gets worse.  You have a fever.  Your burn changes in appearance or develops black or red spots.  Your burn feels warm to the touch.  Your pain is not controlled with medicine. Get help right away if:  You have redness, swelling, or pain at the site of the burn.  You have fluid, blood, or pus coming from your burn.  You have red streaks near the burn.  You have severe pain. This information is not intended to replace advice given to you by your health care provider. Make sure you discuss any questions you have with your health care provider. Document Revised: 09/22/2017 Document Reviewed: 03/29/2016 Elsevier Patient Education  [place].

## 2020-03-28 LAB — THYROID PANEL WITH TSH
Free Thyroxine Index: 2.3 (ref 1.2–4.9)
T3 Uptake Ratio: 24 % (ref 24–39)
T4, Total: 9.5 ug/dL (ref 4.5–12.0)
TSH: 2.95 u[IU]/mL (ref 0.450–4.500)

## 2020-04-01 ENCOUNTER — Other Ambulatory Visit: Payer: Self-pay

## 2020-04-01 ENCOUNTER — Other Ambulatory Visit: Payer: Self-pay | Admitting: Family Medicine

## 2020-04-01 ENCOUNTER — Ambulatory Visit
Admission: RE | Admit: 2020-04-01 | Discharge: 2020-04-01 | Disposition: A | Discharge status: Home / Self Care | Payer: BC Managed Care – PPO | Source: Ambulatory Visit | Attending: Adult Health | Admitting: Adult Health

## 2020-04-01 DIAGNOSIS — Z9889 Other specified postprocedural states: Secondary | ICD-10-CM

## 2020-04-01 DIAGNOSIS — Z853 Personal history of malignant neoplasm of breast: Secondary | ICD-10-CM | POA: Diagnosis not present

## 2020-04-01 DIAGNOSIS — R928 Other abnormal and inconclusive findings on diagnostic imaging of breast: Secondary | ICD-10-CM | POA: Diagnosis not present

## 2020-04-01 MED ORDER — LEVOTHYROXINE SODIUM 75 MCG PO TABS: 75.0000 ug | ORAL_TABLET | Freq: Every day | ORAL | 0 refills | Status: DC

## 2020-04-02 DIAGNOSIS — R1011 Right upper quadrant pain: Secondary | ICD-10-CM | POA: Diagnosis not present

## 2020-04-02 DIAGNOSIS — R1012 Left upper quadrant pain: Secondary | ICD-10-CM | POA: Diagnosis not present

## 2020-04-06 ENCOUNTER — Other Ambulatory Visit: Payer: Self-pay

## 2020-04-06 ENCOUNTER — Ambulatory Visit (HOSPITAL_COMMUNITY)
Admission: RE | Admit: 2020-04-06 | Discharge: 2020-04-06 | Disposition: A | Discharge status: Home / Self Care | Payer: BC Managed Care – PPO | Source: Ambulatory Visit | Attending: Internal Medicine | Admitting: Internal Medicine

## 2020-04-06 ENCOUNTER — Encounter (HOSPITAL_COMMUNITY): Payer: Self-pay | Admitting: Internal Medicine

## 2020-04-06 VITALS — BP 128/90 | HR 86 | Wt 223.0 lb

## 2020-04-06 DIAGNOSIS — Z8249 Family history of ischemic heart disease and other diseases of the circulatory system: Secondary | ICD-10-CM | POA: Diagnosis not present

## 2020-04-06 DIAGNOSIS — I10 Essential (primary) hypertension: Secondary | ICD-10-CM

## 2020-04-06 DIAGNOSIS — E039 Hypothyroidism, unspecified: Secondary | ICD-10-CM | POA: Insufficient documentation

## 2020-04-06 DIAGNOSIS — M1990 Unspecified osteoarthritis, unspecified site: Secondary | ICD-10-CM | POA: Insufficient documentation

## 2020-04-06 DIAGNOSIS — Z7982 Long term (current) use of aspirin: Secondary | ICD-10-CM | POA: Insufficient documentation

## 2020-04-06 DIAGNOSIS — I110 Hypertensive heart disease with heart failure: Secondary | ICD-10-CM | POA: Diagnosis not present

## 2020-04-06 DIAGNOSIS — Z882 Allergy status to sulfonamides status: Secondary | ICD-10-CM | POA: Diagnosis not present

## 2020-04-06 DIAGNOSIS — E785 Hyperlipidemia, unspecified: Secondary | ICD-10-CM | POA: Diagnosis not present

## 2020-04-06 DIAGNOSIS — R0683 Snoring: Secondary | ICD-10-CM | POA: Insufficient documentation

## 2020-04-06 DIAGNOSIS — M549 Dorsalgia, unspecified: Secondary | ICD-10-CM | POA: Insufficient documentation

## 2020-04-06 DIAGNOSIS — Z8616 Personal history of COVID-19: Secondary | ICD-10-CM | POA: Diagnosis not present

## 2020-04-06 DIAGNOSIS — Z881 Allergy status to other antibiotic agents status: Secondary | ICD-10-CM | POA: Insufficient documentation

## 2020-04-06 DIAGNOSIS — Z95810 Presence of automatic (implantable) cardiac defibrillator: Secondary | ICD-10-CM | POA: Insufficient documentation

## 2020-04-06 DIAGNOSIS — Z803 Family history of malignant neoplasm of breast: Secondary | ICD-10-CM | POA: Insufficient documentation

## 2020-04-06 DIAGNOSIS — Z853 Personal history of malignant neoplasm of breast: Secondary | ICD-10-CM | POA: Insufficient documentation

## 2020-04-06 DIAGNOSIS — I5022 Chronic systolic (congestive) heart failure: Secondary | ICD-10-CM | POA: Diagnosis not present

## 2020-04-06 DIAGNOSIS — I428 Other cardiomyopathies: Secondary | ICD-10-CM | POA: Diagnosis not present

## 2020-04-06 DIAGNOSIS — Z8701 Personal history of pneumonia (recurrent): Secondary | ICD-10-CM | POA: Insufficient documentation

## 2020-04-06 DIAGNOSIS — Z79899 Other long term (current) drug therapy: Secondary | ICD-10-CM | POA: Diagnosis not present

## 2020-04-06 MED ORDER — BENZONATATE 100 MG PO CAPS: 100.0000 mg | ORAL_CAPSULE | Freq: Four times a day (QID) | ORAL | 0 refills | Status: DC | PRN

## 2020-04-06 NOTE — Progress Notes (Signed)
ADVANCED HF CLINIC NOTE  Referring Physician: Dr. [name] Primary Cardiologist: Dr. [name]  HPI:  59 y.o.female with breast cancer (s/p lumpectomy and XRT - refused chemo), hypothyroidism referred by Dr. [name] for further evaluation of systolic HF due to NICM. EF ~30%   She has long h/o NICM dating back to 2007. Cath in 2017 with normal cors and EF 25-30%. S/p s-ICD. Medical therapy has been limited by multiple medication intolerances (Spironolactone caused body burning sensation, could not afford Bidil and had fatigue with high does of Entresto).   Recently had COVID PNA in 1/21. Was treated at home. Her mother passed from [place].   Last echo 1/21 EF 25-30% RV normal   I saw her for first visit in 2/21. Was feeling well. Entresto increased to 49/51 bid  We got CPX which showed normal functional capacity.   CPX 12/19/19 FVC 2.75 (87%)    FEV1 2.14 (86%)     FEV1/FVC 78 (98%)     MVV 105 (104%)   Resting HR: 85 Standing HR: 86 Peak HR: 161  (100% age predicted max HR)  BP rest: 128/84 Standing BP: 116/82 BP peak: 166/72  Peak VO2: 17.7 (97%% predicted peak VO2)  - adjusted for ibw peak VO2 is 24.7 ml/kg (ibw)/min VE/VCO2 slope: 29  Peak RER: 1.08  O2pulse: 11  (100% predicted O2pulse)   Here for f/u. Doing well from a HF perspective. Only real complaint is dry hacking cough. No edema, orthopnea or PND. Struggling with back pain. Walking up to 25 mins/day now. No SOB.    ICD interrogated personally in clinic: No VT/AF Activity level 0.6hr/day 35 episodes of NSVT Personally reviewed     Past Medical History:  Diagnosis Date   AICD (automatic cardioverter/defibrillator) present 09   [employer]- replaced 2016   Arthritis    Bradycardia    Breast cancer ([place]) 02/24/2016   s/p lumpectomy and radiation but refused chemotherapy   Chronic systolic CHF (congestive heart failure) (HCC)    Cough due to angiotensin-converting enzyme inhibitor     Dizziness    Dizziness with standing after squatting, March, 2012   Dyslipidemia    LDL elevation,Patient does not want statin   Family history of breast cancer    GERD (gastroesophageal reflux disease)    H/O shortness of breath    CPX 10/18:  No evidence of cardiopulmonary limitation. Suspect exercise intolerance related to weight and deconditioning.    Hypothyroidism    ICD (implantable cardiac defibrillator) battery depletion    Dr. [name], June, 2009(artifactual atrial tachycardia response events addressed by reprogramming in parentheses   Leg swelling    right leg   Mitral regurgitation    Nonischemic cardiomyopathy ([place])    Etiology unknown, diagnosed 2008   Personal history of radiation therapy 2017   Pneumonia due to COVID-19 virus 10/2019   TIA (transient ischemic attack)    No CT or MRI abnormality, aspirin therapy    Current Outpatient Medications  Medication Sig Dispense Refill   albuterol (VENTOLIN HFA) 108 (90 Base) MCG/ACT inhaler Inhale 2 puffs into the lungs every 6 (six) hours as needed for wheezing or shortness of breath. 18 g 0   amitriptyline (ELAVIL) 10 MG tablet Take 10 mg by mouth at bedtime.      aspirin EC 81 MG tablet Take 1 tablet (81 mg total) by mouth daily. 90 tablet 3   carvedilol (COREG) 6.25 MG tablet Take 0.5 tablets (3.125 mg total) by mouth 2 (two)  times daily. 90 tablet 2   dicyclomine (BENTYL) 10 MG capsule Take 10 mg by mouth 3 (three) times daily before meals.      fluticasone (FLONASE) 50 MCG/ACT nasal spray Place 2 sprays into both nostrils daily.     furosemide (LASIX) 20 MG tablet Take 1 tablet (20 mg total) by mouth daily as needed. 90 tablet 2   levothyroxine (SYNTHROID) 75 MCG tablet Take 1 tablet (75 mcg total) by mouth daily. Schedule OV with Dr Darnell Level 90 tablet 0   mupirocin ointment (BACTROBAN) 2 % Apply 1 application topically 2 (two) times daily. x10 days 22 g 0   omeprazole (PRILOSEC) 40 MG capsule Take 40 mg by  mouth daily.     sacubitril-valsartan (ENTRESTO) 97-103 MG Take 1 tablet by mouth 2 (two) times daily. 60 tablet 6   No current facility-administered medications for this encounter.    Allergies  Allergen Reactions   Avelox [Moxifloxacin Hcl In Nacl] Shortness Of Breath    Shortness of breath   Clindamycin/Lincomycin Shortness Of Breath and Diarrhea    Breathing/diarrhea     Kiwi Extract Anaphylaxis    Makes pt. Feel like her throat is closing    Bactrim [Sulfamethoxazole-Trimethoprim] Swelling   Levofloxacin Other (See Comments)    JOINT PAIN JOINT PAIN   Moxifloxacin Other (See Comments)    Breathing problems Breathing problems   Simvastatin Other (See Comments)    Joint pain   Spironolactone Other (See Comments)    burning   Sulfa Antibiotics Swelling      Social History   Socioeconomic History   Marital status: Widowed    Spouse name: Not on file   Number of children: Not on file   Years of education: Not on file   Highest education level: Not on file  Occupational History   Occupation: Widow  Tobacco Use   Smoking status: Never Smoker   Smokeless tobacco: Never Used  Scientific laboratory technician Use: Never used  Substance and Sexual Activity   Alcohol use: No   Drug use: No   Sexual activity: Not on file  Other Topics Concern   Not on file  Social History Narrative   Not on file   Social Determinants of Health   Financial Resource Strain: Medium Risk   Difficulty of Paying Living Expenses: Somewhat hard  Food Insecurity:    Worried About Charity fundraiser in the Last Year:    Arboriculturist in the Last Year:   Transportation Needs: No Transportation Needs   Lack of Transportation (Medical): No   Lack of Transportation (Non-Medical): No  Physical Activity: Insufficiently Active   Days of Exercise per Week: 1 day   Minutes of Exercise per Session: 10 min  Stress:    Feeling of Stress :   Social Connections:     Frequency of Communication with Friends and Family:    Frequency of Social Gatherings with Friends and Family:    Attends Religious Services:    Active Member of Clubs or Organizations:    Attends Music therapist:    Marital Status:   Intimate Partner Violence:    Fear of Current or Ex-Partner:    Emotionally Abused:    Physically Abused:    Sexually Abused:       Family History  Problem Relation Age of Onset   Heart attack Mother    Cancer Mother        breast   Breast  cancer Mother 42   Hypertension Sister    Hypertension Brother    Cancer Maternal Aunt        breast   Breast cancer Maternal Aunt    Cancer Paternal Grandmother        possible cancer, unknown type   Stroke Neg Hx    Diabetes Neg Hx     Vitals:   04/06/20 1156  BP: 128/90  Pulse: 86  SpO2: 100%  Weight: 101.2 kg (223 lb)    PHYSICAL EXAM: General:  Well appearing. No resp difficulty HEENT: normal Neck: supple. no JVD. Carotids 2+ bilat; no bruits. No lymphadenopathy or thryomegaly appreciated. Cor: PMI nondisplaced. Regular rate & rhythm. No rubs, gallops or murmurs. Lungs: clear Abdomen: obese soft, nontender, nondistended. No hepatosplenomegaly. No bruits or masses. Good bowel sounds. Extremities: no cyanosis, clubbing, rash, edema Neuro: alert & orientedx3, cranial nerves grossly intact. moves all 4 extremities w/o difficulty. Affect pleasant  ECG 01/23/20: NSR 89 QRS 10ms No ST-T wave abnormalities. Personally reviewed   ASSESSMENT & PLAN:  1. Chronic systolic HF - longstanding NICM dating back to 2007. Unclear etiology - Cath in 2017 with normal cors and EF 25-30%. S/p s-ICD - Echo 11/20 read as 35-40% (likely 30-35%)  - Echo 1/21 EF 25-30%  RV normal  (post-COVID) - Improved NYHA II - CPX testing 2/21 with normal functional capacity when corrected for ibw. No obvious HF limitation - Volume status good. Off lasix with Entresto.  - GDMT limited by  multiple medication intolerances (Spironolactone caused body burning sensation, could not afford Bidil  - Continue Entresto to 97/103 bid - Continue carvedilol 3.125 bid - We discussed Rarxiga but would like to avoid given her propensity for yeast infections.  - Suspect cough may be related to [facility] but tolerable for now   2. HTN - Blood pressure well controlled. Continue current regimen.  3. Snoring - Pending sleep study. Will reorder  F/u in Clinic in 6 months with echo   Glori Bickers, MD  12:36 PM

## 2020-04-06 NOTE — Patient Instructions (Addendum)
No labs done today.    Dr. [name] did send in [place] to help with your cough. Please follow the directions as instructed on the bottle.   No other medication changes were made. Please continue all current medications as needed.  Your provider has recommended that you have a home sleep study.  [employer] is the company that does these test.  They will contact you by phone and must speak with you before they can ship the equipment.  Once they have spoken with you they will send the equipment right to your home with instructions on how to set it up.  Once you have completed the test you just dispose of the equipment, the information is automatically uploaded to [place] via [employer] technology.  IF you have any questions or issues with the equipment please call the company directly at [phone].  If your test is positive for sleep apnea and you need a home CPAP machine you will be contacted by Dr Theodosia Blender office [facility]) to set this up.  Your physician recommends that you schedule a follow-up appointment in: 6 months with an echo prior to your exam. Our office will contact you at a later date to schedule an appointment or you can contact our office in November to schedule a December appointment.  If you have any questions or concerns before your next appointment please send [place] a message through [place] or call our office at [phone].    TO LEAVE A MESSAGE FOR THE NURSE SELECT OPTION 2, PLEASE LEAVE A MESSAGE INCLUDING: . YOUR NAME . DATE OF BIRTH . CALL BACK NUMBER . REASON FOR CALL**this is important as we prioritize the call backs  [place] AS LONG AS YOU CALL BEFORE 4:00 PM   At the [facility], you and your health needs are our priority. As part of our continuing mission to provide you with exceptional heart care, we have created designated Provider Care Teams. These Care Teams include your primary Cardiologist  (physician) and Advanced Practice Providers (APPs- Physician Assistants and Nurse Practitioners) who all work together to provide you with the care you need, when you need it.   You may see any of the following providers on your designated Care Team at your next follow up: [name] Dr Glori Bickers . Dr Loralie Champagne . Darrick Grinder, NP . Lyda Jester, PA . Audry Riles, PharmD   Please be sure to bring in all your medications bottles to every appointment.

## 2020-04-06 NOTE — Addendum Note (Signed)
Encounter addended by: Shonna Chock, CMA on: 1/90/1222 1:01 PM  Actions taken: Pharmacy for encounter modified, Clinical Note Signed, Order list changed, Diagnosis association updated

## 2020-04-09 DIAGNOSIS — M545 Low back pain: Secondary | ICD-10-CM | POA: Diagnosis not present

## 2020-04-11 DIAGNOSIS — R0981 Nasal congestion: Secondary | ICD-10-CM | POA: Diagnosis not present

## 2020-04-14 ENCOUNTER — Encounter: Payer: Self-pay | Admitting: Family Medicine

## 2020-04-16 DIAGNOSIS — R0989 Other specified symptoms and signs involving the circulatory and respiratory systems: Secondary | ICD-10-CM | POA: Diagnosis not present

## 2020-04-18 ENCOUNTER — Other Ambulatory Visit: Payer: Self-pay

## 2020-04-18 ENCOUNTER — Ambulatory Visit (HOSPITAL_COMMUNITY)
Admission: RE | Admit: 2020-04-18 | Discharge: 2020-04-18 | Disposition: A | Discharge status: Home / Self Care | Payer: BC Managed Care – PPO | Source: Ambulatory Visit | Attending: Family Medicine | Admitting: Family Medicine

## 2020-04-18 ENCOUNTER — Encounter (HOSPITAL_COMMUNITY): Payer: Self-pay

## 2020-04-18 VITALS — BP 119/92 | HR 99 | Temp 98.8°F | Resp 18

## 2020-04-18 DIAGNOSIS — R059 Cough, unspecified: Secondary | ICD-10-CM

## 2020-04-18 DIAGNOSIS — R0981 Nasal congestion: Secondary | ICD-10-CM | POA: Diagnosis not present

## 2020-04-18 DIAGNOSIS — R05 Cough: Secondary | ICD-10-CM

## 2020-04-18 MED ORDER — LEVOCETIRIZINE DIHYDROCHLORIDE 5 MG PO TABS: 5.0000 mg | ORAL_TABLET | Freq: Every evening | ORAL | 0 refills | Status: DC

## 2020-04-18 MED ORDER — MONTELUKAST SODIUM 10 MG PO TABS: 10.0000 mg | ORAL_TABLET | Freq: Every day | ORAL | 0 refills | Status: DC

## 2020-04-18 MED ORDER — FLUTICASONE PROPIONATE 50 MCG/ACT NA SUSP: 2.0000 | Freq: Every day | NASAL | 2 refills | Status: DC

## 2020-04-18 NOTE — ED Triage Notes (Signed)
Pt present sinus issues with chest congestion. Symptoms started over a week ago.

## 2020-04-18 NOTE — Discharge Instructions (Addendum)
I would add in [place] if the medications are not helping that I have prescribed  I have sent in zyrtec D and flonase to your pharmacy  I have also sent in Singulair to help with the allergic rhinitis that you are experiencing  You may continue the Amox-Clav as prescribed  Follow up with this office or with primary care as needed

## 2020-04-18 NOTE — ED Provider Notes (Signed)
[place]   [id number] 04/18/20 Arrival Time: 1238  KD:TOIZ THROAT  SUBJECTIVE: History from: patient.  [name] [name] is a 59 y.o. female who presents with abrupt onset of nasal congestion, headache, fatigue the last week and a half. Reports that she has just started Augmentin from another urgent care. Reports that she has had a course of prednisone already. Reports that she is still coughing, and just not feeling well. Reports the nasal congestion is also an issue at this time. She also reports that she has Hycodan cough syrup from the other urgent care, and reports that this is helping with her cough.  Denies fever, chills, fatigue, ear pain, sinus pain, rhinorrhea, SOB, wheezing, chest pain, nausea, rash, changes in bowel or bladder habits.     ROS: As per HPI.  All other pertinent ROS negative.     Past Medical History:  Diagnosis Date  . AICD (automatic cardioverter/defibrillator) present 09   [employer]- replaced 2016  . Arthritis   . Bradycardia   . Breast cancer ([place]) 02/24/2016   s/p lumpectomy and radiation but refused chemotherapy  . Chronic systolic CHF (congestive heart failure) ([place])   . Cough due to angiotensin-converting enzyme inhibitor   . Dizziness    Dizziness with standing after squatting, March, 2012  . Dyslipidemia    LDL elevation,Patient does not want statin  . Family history of breast cancer   . GERD (gastroesophageal reflux disease)   . H/O shortness of breath    CPX 10/18:  No evidence of cardiopulmonary limitation. Suspect exercise intolerance related to weight and deconditioning.   [name] Hypothyroidism   . ICD (implantable cardiac defibrillator) battery depletion    Dr. [name], June, 2009(artifactual atrial tachycardia response events addressed by reprogramming in parentheses  . Leg swelling    right leg  . Mitral regurgitation   . Nonischemic cardiomyopathy ([place])    Etiology unknown, diagnosed 2008  . Personal history of  radiation therapy 2017  . Pneumonia due to COVID-19 virus 10/2019  . TIA (transient ischemic attack)    No CT or MRI abnormality, aspirin therapy   Past Surgical History:  Procedure Laterality Date  . BREAST BIOPSY Left 02/24/2016   U/S Core  . BREAST BIOPSY Left 02/24/2016   U/S Core  . BREAST LUMPECTOMY Left 04/05/2016   invasive ductal   . BREAST LUMPECTOMY WITH RADIOACTIVE SEED AND SENTINEL LYMPH NODE BIOPSY Left 04/05/2016   Procedure: BREAST LUMPECTOMY WITH RADIOACTIVE SEED AND SENTINEL LYMPH NODE BIOPSY;  Surgeon: [name], MD;  Location: [facility];  Service: General;  Laterality: Left;  . CARDIAC CATHETERIZATION N/A 08/05/2016   Procedure: Left Heart Cath and Coronary Angiography;  Surgeon: Peter M Martinique, MD;  Location: [place] CV LAB;  Service: Cardiovascular;  Laterality: N/A;  . CARDIAC DEFIBRILLATOR PLACEMENT  09   [employer] no remote  . COLONOSCOPY    . CYST EXCISION Left    back of leg-lt   . IMPLANTABLE CARDIOVERTER DEFIBRILLATOR GENERATOR CHANGE N/A 12/29/2014   Procedure: IMPLANTABLE CARDIOVERTER DEFIBRILLATOR GENERATOR CHANGE;  Surgeon: [name], MD;  Location: [facility] CATH LAB;  Service: Cardiovascular;  Laterality: N/A;  . LATERAL EPICONDYLE RELEASE  10/11/2012   Procedure: TENNIS ELBOW RELEASE;  Surgeon: [name], MD;  Location: [facility];  Service: Orthopedics;  Laterality: Right;  RIGHT ELBOW: TENOTOMY ELBOW LATERAL EPICONDYLITIS TENNIS ELBOW: RADIAL TUNNEL RELEASE  . PORT-A-CATH REMOVAL  04/13/2016   Procedure: MINOR REMOVAL PORT-A-CATH;  Surgeon: Rodman Key  Georgette Dover, MD;  Location: [place];  Service: General;;  . PORTACATH PLACEMENT Right 04/05/2016   Procedure: INSERTION PORT-A-CATH WITH [place];  Surgeon: [name], MD;  Location: [facility];  Service: General;  Laterality: Right;  right IJ  . TOTAL THYROIDECTOMY  2001  . TUBAL LIGATION  94  . UPPER GI ENDOSCOPY     Allergies  Allergen Reactions  . Avelox  [Moxifloxacin Hcl In [place]] Shortness Of Breath    Shortness of breath  . Clindamycin/Lincomycin Shortness Of Breath and Diarrhea    Breathing/diarrhea    . Kiwi Extract Anaphylaxis    Makes pt. Feel like her throat is closing   . Bactrim [Sulfamethoxazole-Trimethoprim] Swelling  . Levofloxacin Other (See Comments)    JOINT PAIN JOINT PAIN  . Moxifloxacin Other (See Comments)    Breathing problems Breathing problems  . Simvastatin Other (See Comments)    Joint pain  . Spironolactone Other (See Comments)    burning  . Sulfa Antibiotics Swelling   No current facility-administered medications on file prior to encounter.   Current Outpatient Medications on File Prior to Encounter  Medication Sig Dispense Refill  . albuterol (VENTOLIN HFA) 108 (90 Base) MCG/ACT inhaler Inhale 2 puffs into the lungs every 6 (six) hours as needed for wheezing or shortness of breath. 18 g 0  . amitriptyline (ELAVIL) 10 MG tablet Take 10 mg by mouth at bedtime.     [name] aspirin EC 81 MG tablet Take 1 tablet (81 mg total) by mouth daily. 90 tablet 3  . benzonatate (TESSALON PERLES) 100 MG capsule Take 1 capsule (100 mg total) by mouth every 6 (six) hours as needed for cough. 30 capsule 0  . carvedilol (COREG) 6.25 MG tablet Take 0.5 tablets (3.125 mg total) by mouth 2 (two) times daily. 90 tablet 2  . dicyclomine (BENTYL) 10 MG capsule Take 10 mg by mouth 3 (three) times daily before meals.     . furosemide (LASIX) 20 MG tablet Take 1 tablet (20 mg total) by mouth daily as needed. 90 tablet 2  . levothyroxine (SYNTHROID) 75 MCG tablet Take 1 tablet (75 mcg total) by mouth daily. Schedule OV with Dr Darnell Level 90 tablet 0  . mupirocin ointment (BACTROBAN) 2 % Apply 1 application topically 2 (two) times daily. x10 days 22 g 0  . omeprazole (PRILOSEC) 40 MG capsule Take 40 mg by mouth daily.    . sacubitril-valsartan (ENTRESTO) 97-103 MG Take 1 tablet by mouth 2 (two) times daily. 60 tablet 6   Social History    Socioeconomic History  . Marital status: Widowed    Spouse name: Not on file  . Number of children: Not on file  . Years of education: Not on file  . Highest education level: Not on file  Occupational History  . Occupation: Widow  Tobacco Use  . Smoking status: Never Smoker  . Smokeless tobacco: Never Used  Vaping Use  . Vaping Use: Never used  Substance and Sexual Activity  . Alcohol use: No  . Drug use: No  . Sexual activity: Not on file  Other Topics Concern  . Not on file  Social History Narrative  . Not on file   Social Determinants of Health   Financial Resource Strain: Medium Risk  . Difficulty of Paying Living Expenses: Somewhat hard  Food Insecurity:   . Worried About Charity fundraiser in the Last Year:   . [place] in the Last Year:  Transportation Needs: No Transportation Needs  . Lack of Transportation (Medical): No  . Lack of Transportation (Non-Medical): No  Physical Activity: Insufficiently Active  . Days of Exercise per Week: 1 day  . Minutes of Exercise per Session: 10 min  Stress:   . Feeling of Stress :   Social Connections:   . Frequency of Communication with Friends and Family:   . Frequency of Social Gatherings with Friends and Family:   . Attends Religious Services:   . Active Member of Clubs or Organizations:   . Attends Archivist Meetings:   [name] Marital Status:   Intimate Partner Violence:   . Fear of Current or Ex-Partner:   . Emotionally Abused:   [name] Physically Abused:   . Sexually Abused:    Family History  Problem Relation Age of Onset  . Heart attack Mother   . Cancer Mother        breast  . Breast cancer Mother 41  . Hypertension Sister   . Hypertension Brother   . Cancer Maternal Aunt        breast  . Breast cancer Maternal Aunt   . Cancer Paternal Grandmother        possible cancer, unknown type  . Stroke Neg Hx   . Diabetes Neg Hx     OBJECTIVE:  Vitals:   04/18/20 1315  BP: (!) 119/92   Pulse: 99  Resp: 18  Temp: 98.8 F (37.1 C)  TempSrc: Oral  SpO2: 100%     General appearance: alert; appears fatigued, but nontoxic, speaking in full sentences and managing own secretions HEENT: NCAT; Ears: EACs clear, TMs pearly gray with visible cone of light, without erythema; Eyes: PERRL, EOMI grossly; Nose: no obvious rhinorrhea; Throat: oropharynx clear, tonsils 1+ and mildly erythematous without white tonsillar exudates, uvula midline Neck: supple without LAD Lungs: CTA bilaterally without adventitious breath sounds; cough absent Heart: regular rate and rhythm.  Radial pulses 2+ symmetrical bilaterally Skin: warm and dry Psychological: alert and cooperative; normal mood and affect  LABS: No results found for this or any previous visit (from the past 24 hour(s)).   ASSESSMENT & PLAN:  1. Nasal congestion   2. Cough     Meds ordered this encounter  Medications  . levocetirizine (XYZAL) 5 MG tablet    Sig: Take 1 tablet (5 mg total) by mouth every evening.    Dispense:  30 tablet    Refill:  0    Order Specific Question:   Supervising Provider    Answer:   Chase Picket [id number]  . fluticasone (FLONASE) 50 MCG/ACT nasal spray    Sig: Place 2 sprays into both nostrils daily.    Dispense:  9.9 mL    Refill:  2    Order Specific Question:   Supervising Provider    Answer:   Chase Picket [id number]  . montelukast (SINGULAIR) 10 MG tablet    Sig: Take 1 tablet (10 mg total) by mouth at bedtime.    Dispense:  30 tablet    Refill:  0    Order Specific Question:   Supervising Provider    Answer:   Chase Picket [id number]     Treated with the above therapy based on length of symptoms Continue Augmentin regimen as prescribed from previous provider Push fluids and get rest Drink warm or cool liquids, use throat lozenges, or popsicles to help alleviate symptoms Take OTC ibuprofen or tylenol as needed for pain Follow  up with PCP if symptoms persist Return  or go to ER if you have any new or worsening symptoms such as fever, chills, nausea, vomiting, worsening sore throat, cough, abdominal pain, chest pain, changes in bowel or bladder habits.   Reviewed expectations re: course of current medical issues. Questions answered. Outlined signs and symptoms indicating need for more acute intervention. Patient verbalized understanding. After Visit Summary given.           Faustino Congress, NP 04/19/20 1217

## 2020-04-21 ENCOUNTER — Emergency Department (HOSPITAL_COMMUNITY): Payer: BC Managed Care – PPO

## 2020-04-21 ENCOUNTER — Observation Stay (HOSPITAL_COMMUNITY)
Admission: EM | Admit: 2020-04-21 | Discharge: 2020-04-22 | Disposition: A | Discharge status: Home / Self Care | Payer: BC Managed Care – PPO | Attending: Family Medicine | Admitting: Family Medicine

## 2020-04-21 ENCOUNTER — Encounter (HOSPITAL_COMMUNITY): Payer: Self-pay | Admitting: Emergency Medicine

## 2020-04-21 ENCOUNTER — Other Ambulatory Visit: Payer: Self-pay

## 2020-04-21 DIAGNOSIS — I5022 Chronic systolic (congestive) heart failure: Secondary | ICD-10-CM | POA: Insufficient documentation

## 2020-04-21 DIAGNOSIS — Z95810 Presence of automatic (implantable) cardiac defibrillator: Secondary | ICD-10-CM | POA: Diagnosis not present

## 2020-04-21 DIAGNOSIS — R079 Chest pain, unspecified: Secondary | ICD-10-CM | POA: Diagnosis not present

## 2020-04-21 DIAGNOSIS — R0789 Other chest pain: Secondary | ICD-10-CM | POA: Diagnosis not present

## 2020-04-21 DIAGNOSIS — J90 Pleural effusion, not elsewhere classified: Secondary | ICD-10-CM | POA: Diagnosis not present

## 2020-04-21 DIAGNOSIS — Z8679 Personal history of other diseases of the circulatory system: Secondary | ICD-10-CM | POA: Insufficient documentation

## 2020-04-21 DIAGNOSIS — Z20822 Contact with and (suspected) exposure to covid-19: Secondary | ICD-10-CM | POA: Insufficient documentation

## 2020-04-21 DIAGNOSIS — Z7982 Long term (current) use of aspirin: Secondary | ICD-10-CM | POA: Insufficient documentation

## 2020-04-21 DIAGNOSIS — I2699 Other pulmonary embolism without acute cor pulmonale: Principal | ICD-10-CM | POA: Diagnosis present

## 2020-04-21 DIAGNOSIS — Z853 Personal history of malignant neoplasm of breast: Secondary | ICD-10-CM | POA: Diagnosis not present

## 2020-04-21 DIAGNOSIS — Z79899 Other long term (current) drug therapy: Secondary | ICD-10-CM | POA: Insufficient documentation

## 2020-04-21 DIAGNOSIS — R0602 Shortness of breath: Secondary | ICD-10-CM | POA: Diagnosis not present

## 2020-04-21 HISTORY — DX: Other pulmonary embolism without acute cor pulmonale: I26.99

## 2020-04-21 LAB — BASIC METABOLIC PANEL
Anion gap: 10 (ref 5–15)
BUN: 9 mg/dL (ref 6–20)
CO2: 26 mmol/L (ref 22–32)
Calcium: 9.3 mg/dL (ref 8.9–10.3)
Chloride: 106 mmol/L (ref 98–111)
Creatinine, Ser: 1 mg/dL (ref 0.44–1.00)
GFR calc Af Amer: >60 mL/min (ref >60)
GFR calc non Af Amer: >60 mL/min (ref >60)
Glucose, Bld: 90 mg/dL (ref 70–99)
Potassium: 3.7 mmol/L (ref 3.5–5.1)
Sodium: 142 mmol/L (ref 135–145)

## 2020-04-21 LAB — CBC
HCT: 36.6 % (ref 36.0–46.0)
Hemoglobin: 11.4 g/dL — ABNORMAL LOW (ref 12.0–15.0)
MCH: 29.7 pg (ref 26.0–34.0)
MCHC: 31.1 g/dL (ref 30.0–36.0)
MCV: 95.3 fL (ref 80.0–100.0)
Platelets: 206 10*3/uL (ref 150–400)
RBC: 3.84 MIL/uL — ABNORMAL LOW (ref 3.87–5.11)
RDW: 13 % (ref 11.5–15.5)
WBC: 6 10*3/uL (ref 4.0–10.5)
nRBC: 0 % (ref 0.0–0.2)

## 2020-04-21 LAB — HEPATIC FUNCTION PANEL
ALT: 16 U/L (ref 0–44)
AST: 16 U/L (ref 15–41)
Albumin: 3.8 g/dL (ref 3.5–5.0)
Alkaline Phosphatase: 57 U/L (ref 38–126)
Bilirubin, Direct: <0.1 mg/dL (ref 0.0–0.2)
Total Bilirubin: 0.4 mg/dL (ref 0.3–1.2)
Total Protein: 7.1 g/dL (ref 6.5–8.1)

## 2020-04-21 LAB — D-DIMER, QUANTITATIVE: D-Dimer, Quant: 3.95 ug/mL-FEU — ABNORMAL HIGH (ref 0.00–0.50)

## 2020-04-21 LAB — BRAIN NATRIURETIC PEPTIDE: B Natriuretic Peptide: 45 pg/mL (ref 0.0–100.0)

## 2020-04-21 LAB — SARS CORONAVIRUS 2 BY RT PCR (HOSPITAL ORDER, PERFORMED IN ~~LOC~~ HOSPITAL LAB): SARS Coronavirus 2: NEGATIVE

## 2020-04-21 LAB — TROPONIN I (HIGH SENSITIVITY)
Troponin I (High Sensitivity): 3 ng/L (ref <18)
Troponin I (High Sensitivity): 3 ng/L (ref <18)

## 2020-04-21 MED ORDER — AMOXICILLIN-POT CLAVULANATE 875-125 MG PO TABS
1.0000 | ORAL_TABLET | Freq: Two times a day (BID) | ORAL | Status: DC
  Administered 2020-04-21 – 2020-04-22 (×2): 1 via ORAL
  Filled 2020-04-21 (×2): qty 1

## 2020-04-21 MED ORDER — FUROSEMIDE 20 MG PO TABS: 20.0000 mg | ORAL_TABLET | Freq: Every day | ORAL | Status: DC | PRN

## 2020-04-21 MED ORDER — ENOXAPARIN SODIUM 100 MG/ML ~~LOC~~ SOLN
100.0000 mg | Freq: Two times a day (BID) | SUBCUTANEOUS | Status: DC
  Administered 2020-04-21 – 2020-04-22 (×2): 100 mg via SUBCUTANEOUS
  Filled 2020-04-21 (×2): qty 1

## 2020-04-21 MED ORDER — DICYCLOMINE HCL 10 MG PO CAPS
10.0000 mg | ORAL_CAPSULE | Freq: Three times a day (TID) | ORAL | Status: DC
  Administered 2020-04-22 (×3): 10 mg via ORAL
  Filled 2020-04-21 (×3): qty 1

## 2020-04-21 MED ORDER — LEVOCETIRIZINE DIHYDROCHLORIDE 5 MG PO TABS: 5.0000 mg | ORAL_TABLET | Freq: Every evening | ORAL | Status: DC

## 2020-04-21 MED ORDER — FLUTICASONE PROPIONATE 50 MCG/ACT NA SUSP
2.0000 | Freq: Every day | NASAL | Status: DC
  Administered 2020-04-22: 2 via NASAL
  Filled 2020-04-21: qty 16

## 2020-04-21 MED ORDER — AMITRIPTYLINE HCL 10 MG PO TABS
10.0000 mg | ORAL_TABLET | ORAL | Status: AC
  Administered 2020-04-21: 10 mg via ORAL
  Filled 2020-04-21 (×2): qty 1

## 2020-04-21 MED ORDER — PANTOPRAZOLE SODIUM 40 MG PO TBEC
40.0000 mg | DELAYED_RELEASE_TABLET | Freq: Every day | ORAL | Status: DC
  Administered 2020-04-22: 40 mg via ORAL
  Filled 2020-04-21: qty 1

## 2020-04-21 MED ORDER — SODIUM CHLORIDE 0.9% FLUSH
3.0000 mL | Freq: Once | INTRAVENOUS | Status: AC
  Administered 2020-04-21: 3 mL via INTRAVENOUS

## 2020-04-21 MED ORDER — ACETAMINOPHEN 325 MG PO TABS: 650.0000 mg | ORAL_TABLET | Freq: Four times a day (QID) | ORAL | Status: DC | PRN

## 2020-04-21 MED ORDER — ONDANSETRON HCL 4 MG PO TABS: 4.0000 mg | ORAL_TABLET | Freq: Four times a day (QID) | ORAL | Status: DC | PRN

## 2020-04-21 MED ORDER — POLYETHYLENE GLYCOL 3350 17 G PO PACK: 17.0000 g | PACK | Freq: Every day | ORAL | Status: DC | PRN

## 2020-04-21 MED ORDER — ACETAMINOPHEN 650 MG RE SUPP: 650.0000 mg | Freq: Four times a day (QID) | RECTAL | Status: DC | PRN

## 2020-04-21 MED ORDER — LORAZEPAM 2 MG/ML IJ SOLN
0.5000 mg | Freq: Once | INTRAMUSCULAR | Status: AC
  Administered 2020-04-21: 0.5 mg via INTRAVENOUS
  Filled 2020-04-21: qty 1

## 2020-04-21 MED ORDER — AMITRIPTYLINE HCL 10 MG PO TABS: 10.0000 mg | ORAL_TABLET | ORAL | Status: DC

## 2020-04-21 MED ORDER — SACUBITRIL-VALSARTAN 97-103 MG PO TABS
1.0000 | ORAL_TABLET | Freq: Two times a day (BID) | ORAL | Status: DC
  Administered 2020-04-21 – 2020-04-22 (×2): 1 via ORAL
  Filled 2020-04-21 (×6): qty 1

## 2020-04-21 MED ORDER — LEVOTHYROXINE SODIUM 75 MCG PO TABS
75.0000 ug | ORAL_TABLET | Freq: Every day | ORAL | Status: DC
  Administered 2020-04-22: 75 ug via ORAL
  Filled 2020-04-21: qty 1

## 2020-04-21 MED ORDER — CARVEDILOL 3.125 MG PO TABS
3.1250 mg | ORAL_TABLET | Freq: Two times a day (BID) | ORAL | Status: DC
  Administered 2020-04-21 – 2020-04-22 (×2): 3.125 mg via ORAL
  Filled 2020-04-21 (×2): qty 1

## 2020-04-21 MED ORDER — IOHEXOL 350 MG/ML SOLN
100.0000 mL | Freq: Once | INTRAVENOUS | Status: AC | PRN
  Administered 2020-04-21: 100 mL via INTRAVENOUS

## 2020-04-21 MED ORDER — ONDANSETRON HCL 4 MG/2ML IJ SOLN: 4.0000 mg | Freq: Four times a day (QID) | INTRAMUSCULAR | Status: DC | PRN

## 2020-04-21 NOTE — ED Provider Notes (Signed)
[facility] EMERGENCY DEPARTMENT Provider Note   CSN: [id number] Arrival date & time: 04/21/20  1504     History Chief Complaint  Patient presents with   Chest Pain    [name] [name] is a 59 y.o. female.  Patient complains of some shortness of breath and chest discomfort off and on for 3 weeks  The history is provided by the patient. No language interpreter was used.  Chest Pain Pain location:  L chest Pain quality: aching   Pain radiates to:  Does not radiate Pain severity:  Mild Onset quality:  Sudden Timing:  Intermittent Progression:  Waxing and waning Chronicity:  New Context: breathing   Relieved by:  Nothing Worsened by:  Nothing Ineffective treatments:  None tried Associated symptoms: no abdominal pain, no back pain, no cough, no fatigue and no headache        Past Medical History:  Diagnosis Date   AICD (automatic cardioverter/defibrillator) present 09   [employer]- replaced 2016   Arthritis    Bradycardia    Breast cancer ([place]) 02/24/2016   s/p lumpectomy and radiation but refused chemotherapy   Chronic systolic CHF (congestive heart failure) (HCC)    Cough due to angiotensin-converting enzyme inhibitor    Dizziness    Dizziness with standing after squatting, March, 2012   Dyslipidemia    LDL elevation,Patient does not want statin   Family history of breast cancer    GERD (gastroesophageal reflux disease)    H/O shortness of breath    CPX 10/18:  No evidence of cardiopulmonary limitation. Suspect exercise intolerance related to weight and deconditioning.    Hypothyroidism    ICD (implantable cardiac defibrillator) battery depletion    Dr. [name], June, 2009(artifactual atrial tachycardia response events addressed by reprogramming in parentheses   Leg swelling    right leg   Mitral regurgitation    Nonischemic cardiomyopathy ([place])    Etiology unknown, diagnosed 2008   Personal history of radiation therapy 2017   Pneumonia  due to COVID-19 virus 10/2019   TIA (transient ischemic attack)    No CT or MRI abnormality, aspirin therapy    Patient Active Problem List   Diagnosis Date Noted   Pulmonary embolism ([place]) 04/21/2020   Post-surgical hypothyroidism 03/27/2020   Lumbar arthropathy 07/15/2019   Anemia 07/15/2019   History of colonic polyps 05/07/2019   Genetic testing 11/22/2016   Family history of breast cancer    Breast cancer- radiation June 2016 08/05/2016   Abnormal stress test 08/01/2016   Cardiac LV ejection fraction 30-35% 07/18/2016   Arm numbness 03/14/2016   Facial numbness 03/14/2016   Breast cancer of upper-inner quadrant of left female breast ([place]) 93/23/5573   Chronic systolic CHF (congestive heart failure) (HCC) 02/26/2014   Ejection fraction < 50%    Itching    Dizziness    Bradycardia    GERD (gastroesophageal reflux disease)    Hypothyroidism    Hyperlipidemia    Leg swelling    Cough due to angiotensin-converting enzyme inhibitor    Dual implantable cardioverter-defibrillator in situ    FOOT PAIN 01/20/2010   History of TIA 2007 01/19/2010    Past Surgical History:  Procedure Laterality Date   BREAST BIOPSY Left 02/24/2016   U/S Core   BREAST BIOPSY Left 02/24/2016   U/S Core   BREAST LUMPECTOMY Left 04/05/2016   invasive ductal    BREAST LUMPECTOMY WITH RADIOACTIVE SEED AND SENTINEL LYMPH NODE BIOPSY Left 04/05/2016   Procedure: BREAST LUMPECTOMY  WITH RADIOACTIVE SEED AND SENTINEL LYMPH NODE BIOPSY;  Surgeon: [name], MD;  Location: [place];  Service: General;  Laterality: Left;   CARDIAC CATHETERIZATION N/A 08/05/2016   Procedure: Left Heart Cath and Coronary Angiography;  Surgeon: Peter M Martinique, MD;  Location: [place] CV [facility];  Service: Cardiovascular;  Laterality: N/A;   CARDIAC DEFIBRILLATOR PLACEMENT  09   [employer] no remote   COLONOSCOPY     CYST EXCISION Left    back of leg-lt    IMPLANTABLE CARDIOVERTER  DEFIBRILLATOR GENERATOR CHANGE N/A 12/29/2014   Procedure: IMPLANTABLE CARDIOVERTER DEFIBRILLATOR GENERATOR CHANGE;  Surgeon: [name], MD;  Location: [facility] CATH LAB;  Service: Cardiovascular;  Laterality: N/A;   LATERAL EPICONDYLE RELEASE  10/11/2012   Procedure: TENNIS ELBOW RELEASE;  Surgeon: [name], MD;  Location: [place];  Service: Orthopedics;  Laterality: Right;  RIGHT ELBOW: TENOTOMY ELBOW LATERAL EPICONDYLITIS TENNIS ELBOW: RADIAL TUNNEL RELEASE   PORT-A-CATH REMOVAL  04/13/2016   Procedure: MINOR REMOVAL PORT-A-CATH;  Surgeon: [name], MD;  Location: [place];  Service: General;;   PORTACATH PLACEMENT Right 04/05/2016   Procedure: INSERTION PORT-A-CATH WITH [place];  Surgeon: [name], MD;  Location: [place];  Service: General;  Laterality: Right;  right IJ   TOTAL THYROIDECTOMY  2001   TUBAL LIGATION  94   UPPER GI ENDOSCOPY       OB History   No obstetric history on file.     Family History  Problem Relation Age of Onset   Heart attack Mother    Cancer Mother        breast   Breast cancer Mother 17   Hypertension Sister    Hypertension Brother    Cancer Maternal Aunt        breast   Breast cancer Maternal Aunt    Cancer Paternal Grandmother        possible cancer, unknown type   Stroke Neg Hx    Diabetes Neg Hx     Social History   Tobacco Use   Smoking status: Never Smoker   Smokeless tobacco: Never Used  Vaping Use   Vaping Use: Never used  Substance Use Topics   Alcohol use: No   Drug use: No    Home Medications Prior to Admission medications   Medication Sig Start Date End Date Taking? Authorizing Provider  acetaminophen (TYLENOL) 500 MG tablet Take 500 mg by mouth every 6 (six) hours as needed for mild pain or moderate pain.   Yes [provider]  albuterol (VENTOLIN HFA) 108 (90 Base) MCG/ACT inhaler Inhale 2 puffs into the lungs every 6 (six) hours as needed for wheezing  or shortness of breath. 10/07/19  Yes Terald Sleeper, PA-C  amitriptyline (ELAVIL) 10 MG tablet Take 10 mg by mouth every other day. At bedtime 12/17/19  Yes [provider]  amoxicillin-clavulanate (AUGMENTIN) 875-125 MG tablet Take 1 tablet by mouth 2 (two) times daily. 10 day course starting on 04/16/2020 04/16/20  Yes [provider]  aspirin EC 81 MG tablet Take 1 tablet (81 mg total) by mouth daily. 07/04/17  Yes Weaver, Scott T, PA-C  brompheniramine-pseudoephedrine-DM 30-2-10 MG/5ML syrup Take 5 mLs by mouth 4 (four) times daily as needed (for cough).  04/11/20  Yes [provider]  carvedilol (COREG) 6.25 MG tablet Take 0.5 tablets (3.125 mg total) by mouth 2 (two) times daily. 01/13/20  Yes Burtis Junes, NP  dicyclomine (BENTYL) 10  MG capsule Take 10 mg by mouth 3 (three) times daily before meals.    Yes [provider]  fluticasone (FLONASE) 50 MCG/ACT nasal spray Place 2 sprays into both nostrils daily. 04/18/20  Yes Faustino Congress, NP  furosemide (LASIX) 20 MG tablet Take 1 tablet (20 mg total) by mouth daily as needed. Patient taking differently: Take 20 mg by mouth daily as needed for fluid.  01/23/20  Yes Bensimhon, Shaune Pascal, MD  levocetirizine (XYZAL) 5 MG tablet Take 1 tablet (5 mg total) by mouth every evening. 04/18/20  Yes Faustino Congress, NP  levothyroxine (SYNTHROID) 75 MCG tablet Take 1 tablet (75 mcg total) by mouth daily. Schedule OV with Dr Darnell Level Patient taking differently: Take 75 mcg by mouth daily.  04/01/20  Yes Gottschalk, Ashly M, DO  omeprazole (PRILOSEC) 40 MG capsule Take 40 mg by mouth daily.   Yes [provider]  sacubitril-valsartan (ENTRESTO) 97-103 MG Take 1 tablet by mouth 2 (two) times daily. 01/23/20  Yes Bensimhon, Shaune Pascal, MD  benzonatate (TESSALON PERLES) 100 MG capsule Take 1 capsule (100 mg total) by mouth every 6 (six) hours as needed for cough. Patient not taking: Reported on 04/21/2020 04/06/20 04/06/21   Bensimhon, Shaune Pascal, MD  montelukast (SINGULAIR) 10 MG tablet Take 1 tablet (10 mg total) by mouth at bedtime. 04/18/20   Faustino Congress, NP  mupirocin ointment (BACTROBAN) 2 % Apply 1 application topically 2 (two) times daily. x10 days Patient not taking: Reported on 04/21/2020 03/27/20   Janora Norlander, DO  predniSONE (DELTASONE) 10 MG tablet Take 10 mg by mouth 2 (two) times daily. Patient not taking: Reported on 04/21/2020 04/11/20   [provider]    Allergies    Avelox [moxifloxacin hcl in nacl], Clindamycin/lincomycin, Kiwi extract, Bactrim [sulfamethoxazole-trimethoprim], Levofloxacin, Simvastatin, and Spironolactone  Review of Systems   Review of Systems  Constitutional: Negative for appetite change and fatigue.  HENT: Negative for congestion, ear discharge and sinus pressure.   Eyes: Negative for discharge.  Respiratory: Negative for cough.        Short of breath  Cardiovascular: Positive for chest pain.  Gastrointestinal: Negative for abdominal pain and diarrhea.  Genitourinary: Negative for frequency and hematuria.  Musculoskeletal: Negative for back pain.  Skin: Negative for rash.  Neurological: Negative for seizures and headaches.  Psychiatric/Behavioral: Negative for hallucinations.    Physical Exam Updated Vital Signs BP 124/77    Pulse 66    Temp 97.6 F (36.4 C) (Oral)    Resp 18    Ht 5\' 8"  (1.727 m)    Wt 101.2 kg    SpO2 100%    BMI 33.91 kg/m   Physical Exam Vitals reviewed.  Constitutional:      Appearance: She is well-developed.  HENT:     Head: Normocephalic.     Nose: Nose normal.  Eyes:     General: No scleral icterus.    Conjunctiva/sclera: Conjunctivae normal.  Neck:     Thyroid: No thyromegaly.  Cardiovascular:     Rate and Rhythm: Normal rate and regular rhythm.     Heart sounds: No murmur heard.  No friction rub. No gallop.   Pulmonary:     Breath sounds: No stridor. No wheezing or rales.  Chest:     Chest wall: No  tenderness.  Abdominal:     General: There is no distension.     Tenderness: There is no abdominal tenderness. There is no rebound.  Musculoskeletal:  General: Normal range of motion.     Cervical back: Neck supple.  Lymphadenopathy:     Cervical: No cervical adenopathy.  Skin:    Findings: No erythema or rash.  Neurological:     Mental Status: She is alert and oriented to person, place, and time.     Motor: No abnormal muscle tone.     Coordination: Coordination normal.  Psychiatric:        Behavior: Behavior normal.     ED Results / Procedures / Treatments   Labs (all labs ordered are listed, but only abnormal results are displayed) Labs Reviewed  CBC - Abnormal; Notable for the following components:      Result Value   RBC 3.84 (*)    Hemoglobin 11.4 (*)    All other components within normal limits  D-DIMER, QUANTITATIVE (NOT AT [facility]) - Abnormal; Notable for the following components:   D-Dimer, Quant 3.95 (*)    All other components within normal limits  SARS CORONAVIRUS 2 BY RT PCR (HOSPITAL ORDER, [facility])  BASIC METABOLIC PANEL  HEPATIC FUNCTION PANEL  BRAIN NATRIURETIC PEPTIDE  TROPONIN I (HIGH SENSITIVITY)  TROPONIN I (HIGH SENSITIVITY)    EKG None  Radiology DG Chest 2 View  Result Date: 04/21/2020 CLINICAL DATA:  Chest pain. EXAM: CHEST - 2 VIEW COMPARISON:  October 31, 2019. FINDINGS: Stable cardiomediastinal silhouette. No pneumothorax or pleural effusion is noted. Left-sided pacemaker is unchanged in position. Both lungs are clear. The visualized skeletal structures are unremarkable. IMPRESSION: No active cardiopulmonary disease. Electronically Signed   By: Marijo Conception M.D.   On: 04/21/2020 15:55   CT Angio Chest PE W and/or Wo Contrast  Result Date: 04/21/2020 CLINICAL DATA:  Chest pain and shortness of breath. EXAM: CT ANGIOGRAPHY CHEST WITH CONTRAST TECHNIQUE: Multidetector CT imaging of the chest was performed  using the standard protocol during bolus administration of intravenous contrast. Multiplanar CT image reconstructions and MIPs were obtained to evaluate the vascular anatomy. CONTRAST:  183mL OMNIPAQUE IOHEXOL 350 MG/ML SOLN COMPARISON:  Chest x-ray from same day. CTA chest dated September 18, 2019. FINDINGS: Cardiovascular: Satisfactory opacification of the pulmonary arteries to the segmental level. Eccentric, more subacute to chronic appearing small segmental pulmonary emboli in the right upper lobe (series 7, images 68-72) and subsegmental pulmonary emboli in the left lower lobe (series 4, images 54 and 57). Unchanged left chest wall pacemaker and mild cardiomegaly. No pericardial effusion. No thoracic aortic aneurysm or dissection. Mediastinum/Nodes: No enlarged mediastinal, hilar, or axillary lymph nodes. Prior thyroidectomy. Trachea and esophagus demonstrate no significant findings. Lungs/Pleura: Lungs are clear. No pleural effusion or pneumothorax. Upper Abdomen: No acute abnormality. 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 Musculoskeletal: No chest wall abnormality. No acute or significant osseous findings. Review of the MIP images confirms the above findings. IMPRESSION: 1. Eccentric, more subacute to chronic appearing small segmental pulmonary emboli in the right upper lobe and subsegmental pulmonary emboli in the left lower lobe. Overall clot burden is extremely small. 2. 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 Electronically Signed   By: Titus Dubin M.D.   On: 04/21/2020 17:15    Procedures Procedures (including critical care time)  Medications Ordered in ED Medications  enoxaparin (LOVENOX) injection 100 mg (has no administration in time range)  sodium chloride flush (NS) 0.9 % injection 3 mL (3 mLs Intravenous Given 04/21/20 1604)  LORazepam (ATIVAN)  injection 0.5  mg (0.5 mg Intravenous Given 04/21/20 1604)  iohexol (OMNIPAQUE) 350 MG/ML injection 100 mL (100 mLs Intravenous Contrast Given 04/21/20 1636)    ED Course  I have reviewed the triage vital signs and the nursing notes.  Pertinent labs & imaging results that were available during my care of the patient were reviewed by me and considered in my medical decision making (see chart for details).    MDM Rules/Calculators/A&P                         Patient with shortness of breath and pulmonary embolus on CT scan.  She will be admitted to medicine        This patient presents to the ED for concern of short of breath, this involves an extensive number of treatment options, and is a complaint that carries with it a high risk of complications and morbidity.  The differential diagnosis includes PE pneumonia   Lab Tests:   I Ordered, reviewed, and interpreted labs, which included CBC and chemistries which show mild anemia.  D-dimer elevated  Medicines ordered:   I ordered medication normal saline for dehydration  Imaging Studies ordered:   I ordered imaging studies which included CT chest and  I independently visualized and interpreted imaging which showed small PE  Additional history obtained:   Additional history obtained from family  Previous records obtained and reviewed.  Consultations Obtained:   I consulted hospitalist and discussed lab and imaging findings  Reevaluation:  After the interventions stated above, I reevaluated the patient and found unchanged  Critical Interventions:     Final Clinical Impression(s) / ED Diagnoses Final diagnoses:  Pulmonary embolus, right [facility])    Rx / [place] Orders ED Discharge Orders    None       Milton Ferguson, MD 04/21/20 2023

## 2020-04-21 NOTE — ED Triage Notes (Signed)
Pt c/o chest pain, sob, cough. Pt fell from standing position to lying in lobby entering the waiting room; pt reports she has been seen by urgent care recently

## 2020-04-21 NOTE — Progress Notes (Signed)
[place] for Lovenox Indication: pulmonary embolus  Allergies  Allergen Reactions  . Avelox [Moxifloxacin Hcl In Nacl] Shortness Of Breath    Shortness of breath  . Clindamycin/Lincomycin Shortness Of Breath and Diarrhea    Breathing/diarrhea    . Kiwi Extract Anaphylaxis    Makes pt. Feel like her throat is closing   . Bactrim [Sulfamethoxazole-Trimethoprim] Swelling  . Levofloxacin Other (See Comments)    JOINT PAIN JOINT PAIN  . Simvastatin Other (See Comments)    Joint pain  . Spironolactone Other (See Comments)    burning    Patient Measurements: Height: 5\' 8"  (172.7 cm) Weight: 101.2 kg (223 lb) IBW/kg (Calculated) : 63.9  Vital Signs: Temp: 97.6 F (36.4 C) (06/29 1509) Temp Source: Oral (06/29 1509) BP: 157/83 (06/29 1830) Pulse Rate: 69 (06/29 1830)  Labs: Recent Labs    04/21/20 1510 04/21/20 1655  HGB 11.4*  --   HCT 36.6  --   PLT 206  --   CREATININE 1.00  --   TROPONINIHS 3 3    Estimated Creatinine Clearance: 75.4 mL/min (by C-G formula based on SCr of 1 mg/dL).  Assessment: 59 year old female to begin Lovenox for PE Scr stable  Goal of Therapy:  Anti-Xa level 0.6-1 units/ml 4hrs after LMWH dose given Monitor platelets by anticoagulation protocol: Yes   Plan:  Lovenox 100 mg sq Q 12 hours Follow up CBC  Thank you Anette Guarneri, PharmD  04/21/2020,7:05 PM

## 2020-04-21 NOTE — H&P (Signed)
History and Physical    [name] [name] HWE:[id number] DOB: [birth year] DOA: 04/21/2020  PCP: Janora Norlander, DO   Patient coming from: Home  I have personally briefly reviewed patient's old medical records in [place]  Chief Complaint: Chest pain.  HPI: [name] [name] is a 59 y.o. female with medical history significant for systolic congestive heart failure, ICD, breast cancer 2017 status post lumpectomy and radiotherapy, hypothyroidism. Patient presented to the ED with complaints of central chest pains over the past 3 weeks.  She reports mild associated difficulty breathing.  She reports a cough, and postnasal drip and sinus issues.  Patient has been to 3 urgent cares in the interim, she was prescribed Augmentin which she is still taking, and antihistamines, 5-day steroid course which she has been compliant with.  No lower extremity swelling.  She denies any recent trips.  She is not on any hormonal medications.  No personal or family history of blood clots in lungs or legs.  Patient was diagnosed with COVID-19 pneumonia 10/2019, she did not require hospitalization.  She has completed her COVID-19 vaccines.  ED Course:, Blood pressure 120s to 130s, heart rate 60s to 70s, O2 sats greater than 97% on room air.  D-dimer elevated 3.9.  BNP 45.  Troponin within normal limits.  Unremarkable CBC BMP.  CT chest shows subacute to chronic appearing small segmental PE in right upper lobe and subsegmental PE in left lower lobe.  Extremely small overall clot burden.  Hospitalist to admit for pulmonary embolism.  Review of Systems: As per HPI all other systems reviewed and negative.  Past Medical History:  Diagnosis Date  . AICD (automatic cardioverter/defibrillator) present 09   [employer]- replaced 2016  . Arthritis   . Bradycardia   . Breast cancer ([place]) 02/24/2016   s/p lumpectomy and radiation but refused chemotherapy  . Chronic systolic CHF (congestive heart failure) ([place])   .  Cough due to angiotensin-converting enzyme inhibitor   . Dizziness    Dizziness with standing after squatting, March, 2012  . Dyslipidemia    LDL elevation,Patient does not want statin  . Family history of breast cancer   . GERD (gastroesophageal reflux disease)   . H/O shortness of breath    CPX 10/18:  No evidence of cardiopulmonary limitation. Suspect exercise intolerance related to weight and deconditioning.   [name] Hypothyroidism   . ICD (implantable cardiac defibrillator) battery depletion    Dr. [name], June, 2009(artifactual atrial tachycardia response events addressed by reprogramming in parentheses  . Leg swelling    right leg  . Mitral regurgitation   . Nonischemic cardiomyopathy ([place])    Etiology unknown, diagnosed 2008  . Personal history of radiation therapy 2017  . Pneumonia due to COVID-19 virus 10/2019  . TIA (transient ischemic attack)    No CT or MRI abnormality, aspirin therapy    Past Surgical History:  Procedure Laterality Date  . BREAST BIOPSY Left 02/24/2016   U/S Core  . BREAST BIOPSY Left 02/24/2016   U/S Core  . BREAST LUMPECTOMY Left 04/05/2016   invasive ductal   . BREAST LUMPECTOMY WITH RADIOACTIVE SEED AND SENTINEL LYMPH NODE BIOPSY Left 04/05/2016   Procedure: BREAST LUMPECTOMY WITH RADIOACTIVE SEED AND SENTINEL LYMPH NODE BIOPSY;  Surgeon: [name], MD;  Location: [facility];  Service: General;  Laterality: Left;  . CARDIAC CATHETERIZATION N/A 08/05/2016   Procedure: Left Heart Cath and Coronary Angiography;  Surgeon: Peter M Martinique, MD;  Location: [facility] INVASIVE CV  LAB;  Service: Cardiovascular;  Laterality: N/A;  . CARDIAC DEFIBRILLATOR PLACEMENT  09   [employer] no remote  . COLONOSCOPY    . CYST EXCISION Left    back of leg-lt   . IMPLANTABLE CARDIOVERTER DEFIBRILLATOR GENERATOR CHANGE N/A 12/29/2014   Procedure: IMPLANTABLE CARDIOVERTER DEFIBRILLATOR GENERATOR CHANGE;  Surgeon: [name], MD;  Location: [facility] CATH LAB;  Service:  Cardiovascular;  Laterality: N/A;  . LATERAL EPICONDYLE RELEASE  10/11/2012   Procedure: TENNIS ELBOW RELEASE;  Surgeon: [name], MD;  Location: [facility];  Service: Orthopedics;  Laterality: Right;  RIGHT ELBOW: TENOTOMY ELBOW LATERAL EPICONDYLITIS TENNIS ELBOW: RADIAL TUNNEL RELEASE  . PORT-A-CATH REMOVAL  04/13/2016   Procedure: MINOR REMOVAL PORT-A-CATH;  Surgeon: [name], MD;  Location: [facility];  Service: General;;  . PORTACATH PLACEMENT Right 04/05/2016   Procedure: INSERTION PORT-A-CATH WITH [place];  Surgeon: [name], MD;  Location: [facility];  Service: General;  Laterality: Right;  right IJ  . TOTAL THYROIDECTOMY  2001  . TUBAL LIGATION  94  . UPPER GI ENDOSCOPY       reports that she has never smoked. She has never used smokeless tobacco. She reports that she does not drink alcohol and does not use drugs.  Allergies  Allergen Reactions  . Avelox [Moxifloxacin Hcl In Nacl] Shortness Of Breath    Shortness of breath  . Clindamycin/Lincomycin Shortness Of Breath and Diarrhea    Breathing/diarrhea    . Kiwi Extract Anaphylaxis    Makes pt. Feel like her throat is closing   . Bactrim [Sulfamethoxazole-Trimethoprim] Swelling  . Levofloxacin Other (See Comments)    JOINT PAIN JOINT PAIN  . Simvastatin Other (See Comments)    Joint pain  . Spironolactone Other (See Comments)    burning    Family History  Problem Relation Age of Onset  . Heart attack Mother   . Cancer Mother        breast  . Breast cancer Mother 37  . Hypertension Sister   . Hypertension Brother   . Cancer Maternal Aunt        breast  . Breast cancer Maternal Aunt   . Cancer Paternal Grandmother        possible cancer, unknown type  . Stroke Neg Hx   . Diabetes Neg Hx     Prior to Admission medications   Medication Sig Start Date End Date Taking? Authorizing Provider  acetaminophen (TYLENOL) 500 MG tablet Take 500 mg by mouth every 6 (six) hours as  needed for mild pain or moderate pain.   Yes [provider]  albuterol (VENTOLIN HFA) 108 (90 Base) MCG/ACT inhaler Inhale 2 puffs into the lungs every 6 (six) hours as needed for wheezing or shortness of breath. 10/07/19  Yes Terald Sleeper, PA-C  amitriptyline (ELAVIL) 10 MG tablet Take 10 mg by mouth every other day. At bedtime 12/17/19  Yes [provider]  amoxicillin-clavulanate (AUGMENTIN) 875-125 MG tablet Take 1 tablet by mouth 2 (two) times daily. 10 day course starting on 04/16/2020 04/16/20  Yes [provider]  aspirin EC 81 MG tablet Take 1 tablet (81 mg total) by mouth daily. 07/04/17  Yes Weaver, Scott T, PA-C  brompheniramine-pseudoephedrine-DM 30-2-10 MG/5ML syrup Take 5 mLs by mouth 4 (four) times daily as needed (for cough).  04/11/20  Yes [provider]  carvedilol (COREG) 6.25 MG tablet Take 0.5 tablets (3.125 mg total) by mouth 2 (two) times daily.  01/13/20  Yes Burtis Junes, NP  dicyclomine (BENTYL) 10 MG capsule Take 10 mg by mouth 3 (three) times daily before meals.    Yes [provider]  fluticasone (FLONASE) 50 MCG/ACT nasal spray Place 2 sprays into both nostrils daily. 04/18/20  Yes Faustino Congress, NP  furosemide (LASIX) 20 MG tablet Take 1 tablet (20 mg total) by mouth daily as needed. Patient taking differently: Take 20 mg by mouth daily as needed for fluid.  01/23/20  Yes Bensimhon, Shaune Pascal, MD  levocetirizine (XYZAL) 5 MG tablet Take 1 tablet (5 mg total) by mouth every evening. 04/18/20  Yes Faustino Congress, NP  levothyroxine (SYNTHROID) 75 MCG tablet Take 1 tablet (75 mcg total) by mouth daily. Schedule OV with Dr Darnell Level Patient taking differently: Take 75 mcg by mouth daily.  04/01/20  Yes Gottschalk, Ashly M, DO  omeprazole (PRILOSEC) 40 MG capsule Take 40 mg by mouth daily.   Yes [provider]  sacubitril-valsartan (ENTRESTO) 97-103 MG Take 1 tablet by mouth 2 (two) times daily. 01/23/20  Yes Bensimhon, Shaune Pascal, MD  benzonatate (TESSALON PERLES) 100 MG capsule Take 1 capsule (100 mg total) by mouth every 6 (six) hours as needed for cough. Patient not taking: Reported on 04/21/2020 04/06/20 04/06/21  Bensimhon, Shaune Pascal, MD  montelukast (SINGULAIR) 10 MG tablet Take 1 tablet (10 mg total) by mouth at bedtime. 04/18/20   Faustino Congress, NP  mupirocin ointment (BACTROBAN) 2 % Apply 1 application topically 2 (two) times daily. x10 days Patient not taking: Reported on 04/21/2020 03/27/20   Janora Norlander, DO  predniSONE (DELTASONE) 10 MG tablet Take 10 mg by mouth 2 (two) times daily. Patient not taking: Reported on 04/21/2020 04/11/20   [provider]    Physical Exam: Vitals:   04/21/20 1830 04/21/20 1900 04/21/20 1930 04/21/20 2000  BP: (!) 157/83 (!) 130/54 130/81 124/77  Pulse: 69 77 76 66  Resp: 20 16 (!) 23 18  Temp:      TempSrc:      SpO2: 99% 100% 97% 100%  Weight:      Height:        Constitutional: NAD, calm, comfortable Vitals:   04/21/20 1830 04/21/20 1900 04/21/20 1930 04/21/20 2000  BP: (!) 157/83 (!) 130/54 130/81 124/77  Pulse: 69 77 76 66  Resp: 20 16 (!) 23 18  Temp:      TempSrc:      SpO2: 99% 100% 97% 100%  Weight:      Height:       Eyes: PERRL, lids and conjunctivae normal ENMT: Mucous membranes are moist. Posterior pharynx clear of any exudate or lesions.  Neck: normal, supple, no masses, no thyromegaly Respiratory: clear to auscultation bilaterally, no wheezing, no crackles. Normal respiratory effort. No accessory muscle use.  Cardiovascular: Regular rate and rhythm, no murmurs / rubs / gallops. No extremity edema. 2+ pedal pulses.  Abdomen: no tenderness, no masses palpated. No hepatosplenomegaly. Bowel sounds positive.  Musculoskeletal: no clubbing / cyanosis. No joint deformity upper and lower extremities. Good ROM, no contractures.  Skin: no rashes, lesions, ulcers. No induration Neurologic: No Apparent cranial nerve abnormality, moving all  extremities spontaneously. Psychiatric: Normal judgment and insight. Alert and oriented x 3. Normal mood.   Labs on Admission: I have personally reviewed following labs and imaging studies  CBC: Recent Labs  Lab 04/21/20 1510  WBC 6.0  HGB 11.4*  HCT 36.6  MCV 95.3  PLT 206  Basic Metabolic Panel: Recent Labs  Lab 04/21/20 1510  NA 142  K 3.7  CL 106  CO2 26  GLUCOSE 90  BUN 9  CREATININE 1.00  CALCIUM 9.3   Liver Function Tests: Recent Labs  Lab 04/21/20 1510  AST 16  ALT 16  ALKPHOS 57  BILITOT 0.4  PROT 7.1  ALBUMIN 3.8   BNP (last 3 results) Recent Labs    05/15/19 1034 09/10/19 1602 09/17/19 1440  PROBNP 154 189 130   Urine analysis:    Component Value Date/Time   COLORURINE STRAW (A) 10/31/2019 [place] 10/31/2019 0420   APPEARANCEUR Clear 07/13/2016 1607   LABSPEC 1.006 10/31/2019 0420   PHURINE 6.0 10/31/2019 0420   GLUCOSEU NEGATIVE 10/31/2019 0420   HGBUR NEGATIVE 10/31/2019 0420   BILIRUBINUR NEGATIVE 10/31/2019 0420   BILIRUBINUR Negative 07/13/2016 1607   KETONESUR 5 (A) 10/31/2019 0420   PROTEINUR NEGATIVE 10/31/2019 0420   NITRITE NEGATIVE 10/31/2019 0420   LEUKOCYTESUR NEGATIVE 10/31/2019 0420    Radiological Exams on Admission: DG Chest 2 View  Result Date: 04/21/2020 CLINICAL DATA:  Chest pain. EXAM: CHEST - 2 VIEW COMPARISON:  October 31, 2019. FINDINGS: Stable cardiomediastinal silhouette. No pneumothorax or pleural effusion is noted. Left-sided pacemaker is unchanged in position. Both lungs are clear. The visualized skeletal structures are unremarkable. IMPRESSION: No active cardiopulmonary disease. Electronically Signed   By: Marijo Conception M.D.   On: 04/21/2020 15:55   CT Angio Chest PE W and/or Wo Contrast  Result Date: 04/21/2020 CLINICAL DATA:  Chest pain and shortness of breath. EXAM: CT ANGIOGRAPHY CHEST WITH CONTRAST TECHNIQUE: Multidetector CT imaging of the chest was performed using the standard  protocol during bolus administration of intravenous contrast. Multiplanar CT image reconstructions and MIPs were obtained to evaluate the vascular anatomy. CONTRAST:  157mL OMNIPAQUE IOHEXOL 350 MG/ML SOLN COMPARISON:  Chest x-ray from same day. CTA chest dated September 18, 2019. FINDINGS: Cardiovascular: Satisfactory opacification of the pulmonary arteries to the segmental level. Eccentric, more subacute to chronic appearing small segmental pulmonary emboli in the right upper lobe (series 7, images 68-72) and subsegmental pulmonary emboli in the left lower lobe (series 4, images 54 and 57). Unchanged left chest wall pacemaker and mild cardiomegaly. No pericardial effusion. No thoracic aortic aneurysm or dissection. Mediastinum/Nodes: No enlarged mediastinal, hilar, or axillary lymph nodes. Prior thyroidectomy. Trachea and esophagus demonstrate no significant findings. Lungs/Pleura: Lungs are clear. No pleural effusion or pneumothorax. Upper Abdomen: No acute abnormality. 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 Musculoskeletal: No chest wall abnormality. No acute or significant osseous findings. Review of the MIP images confirms the above findings. IMPRESSION: 1. Eccentric, more subacute to chronic appearing small segmental pulmonary emboli in the right upper lobe and subsegmental pulmonary emboli in the left lower lobe. Overall clot burden is extremely small. 2. 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 Electronically Signed   By: Titus Dubin M.D.   On: 04/21/2020 17:15    EKG: Independently reviewed.  Sinus rhythm, QTC 441. No Significant change from prior.  Assessment/Plan Active Problems:   Pulmonary embolism (HCC)   Pulmonary embolism-ongoing chest pain of 3 weeks duration, CT chest shows subacute to chronic PE right upper lobe, left lower lobe.  Small clot burden.  Stable  vitals without tachycardia or hypoxia.  Troponin and BNP unremarkable.  At this time her PE appears unprovoked.  But she has a  history of breast cancer, Covid pneumonia 1//2021. -Lovenox pharmacy to dose -Recent echo 10/2019, hence deferred echo for now, as clot burden is small. -Lower extremity Dopplers bilateral  Systolic congestive heart failure, ICD status- last echo 2021, EF 25 to 30%, with grade 1 diastolic dysfunction.  Follows with Dr. [name], per notes multiple medication intolerances. -Patient on 20 mg Lasix as needed -Resume carvedilol, Entresto  Hypothyroidism-  -Resume Synthroid.  History of breast cancer- 2017.  Follows with Dr. Lindi Adie.  Status post left lumpectomy, radiation therapy, patient refused adjuvant chemotherapy-hence very high risk for recurrence.  Currently she is under surveillance.  Recent sinusitis, postnasal drip -Complete course of Augmentin  DVT prophylaxis: Lovenox Code Status: Full code Family Communication: None at bedside. Disposition Plan:  1- 2 days Consults called:  None  Admission status:   Obs, tele    Bethena Roys MD [facility]  04/21/2020, 9:59 PM

## 2020-04-22 ENCOUNTER — Observation Stay (HOSPITAL_BASED_OUTPATIENT_CLINIC_OR_DEPARTMENT_OTHER): Payer: BC Managed Care – PPO

## 2020-04-22 ENCOUNTER — Observation Stay (HOSPITAL_COMMUNITY): Payer: BC Managed Care – PPO

## 2020-04-22 DIAGNOSIS — I2699 Other pulmonary embolism without acute cor pulmonale: Secondary | ICD-10-CM | POA: Diagnosis not present

## 2020-04-22 DIAGNOSIS — R079 Chest pain, unspecified: Secondary | ICD-10-CM

## 2020-04-22 LAB — ECHOCARDIOGRAM COMPLETE
Height: 68 in
Weight: 3560 oz

## 2020-04-22 MED ORDER — APIXABAN 5 MG PO TABS: 5.0000 mg | ORAL_TABLET | Freq: Two times a day (BID) | ORAL | Status: DC

## 2020-04-22 MED ORDER — APIXABAN 5 MG PO TABS: 10.0000 mg | ORAL_TABLET | Freq: Two times a day (BID) | ORAL | Status: DC

## 2020-04-22 MED ORDER — ELIQUIS DVT/PE STARTER PACK 5 MG PO TBPK
ORAL_TABLET | ORAL | 0 refills | Status: DC
Start: 2020-04-23 — End: 2020-05-22

## 2020-04-22 MED ORDER — APIXABAN 5 MG PO TABS
10.0000 mg | ORAL_TABLET | Freq: Once | ORAL | Status: AC
  Administered 2020-04-22: 10 mg via ORAL
  Filled 2020-04-22: qty 2

## 2020-04-22 MED ORDER — ASPIRIN EC 81 MG PO TBEC
81.0000 mg | DELAYED_RELEASE_TABLET | Freq: Every day | ORAL | 3 refills | Status: DC
Start: 2020-04-22 — End: 2020-10-05

## 2020-04-22 MED ORDER — APIXABAN 5 MG PO TABS
5.0000 mg | ORAL_TABLET | Freq: Two times a day (BID) | ORAL | 5 refills | Status: DC
Start: 2020-05-22 — End: 2020-12-01

## 2020-04-22 MED ORDER — PERFLUTREN LIPID MICROSPHERE
1.0000 mL | INTRAVENOUS | Status: AC | PRN
  Administered 2020-04-22: 1 mL via INTRAVENOUS
  Filled 2020-04-22: qty 10

## 2020-04-22 NOTE — Discharge Instructions (Signed)
- 1)You are taking Apixaban/Eliquis which is your blood thinners so please Avoid ibuprofen/Advil/Aleve/Motrin/Goody Powders/Naproxen/BC powders/Meloxicam/Diclofenac/Indomethacin and other Nonsteroidal anti-inflammatory medications as these will make you more likely to bleed and can cause stomach ulcers, can also cause Kidney problems.  2) please call if very dark stools, maroon or mahogany colored stools, or bright red blood in your stool, please also call if nosebleeds or blood in your urine  3) Very low-salt diet advised 4)Weigh yourself daily, call if you gain more than 3 pounds in 1 day or more than 5 pounds in 1 week as your diuretic medications may need to be adjusted 5)Limit your Fluid  intake to no more than 60 ounces (1.8 Liters) per day 6) please continue heart medications and keep your appointment with your heart failure specialist Dr. Pierre Bali   Information on my medicine - ELIQUIS (apixaban)  This medication education was reviewed with me or my healthcare representative as part of my discharge preparation.   Why was Eliquis prescribed for you? Eliquis was prescribed to treat blood clots that may have been found in the veins of your legs (deep vein thrombosis) or in your lungs (pulmonary embolism) and to reduce the risk of them occurring again.  What do You need to know about Eliquis ? The starting dose is 10 mg (two 5 mg tablets) taken TWICE daily for the FIRST SEVEN (7) DAYS, thenthe dose is reduced to ONE 5 mg tablet taken TWICE daily.  Eliquis may be taken with or without food.   Try to take the dose about the same time in the morning and in the evening. If you have difficulty swallowing the tablet whole please discuss with your pharmacist how to take the medication safely.  Take Eliquis exactly as prescribed and DO NOT stop taking Eliquis without talking to the doctor who prescribed the medication.  Stopping may increase your risk of developing a new blood clot.   Refill your prescription before you run out.  After discharge, you should have regular check-up appointments with your healthcare provider that is prescribing your Eliquis.    What do you do if you miss a dose? If a dose of ELIQUIS is not taken at the scheduled time, take it as soon as possible on the same day and twice-daily administration should be resumed. The dose should not be doubled to make up for a missed dose.  Important Safety Information A possible side effect of Eliquis is bleeding. You should call your healthcare provider right away if you experience any of the following: ? Bleeding from an injury or your nose that does not stop. ? Unusual colored urine (red or dark brown) or unusual colored stools (red or black). ? Unusual bruising for unknown reasons. ? A serious fall or if you hit your head (even if there is no bleeding).  Some medicines may interact with Eliquis and might increase your risk of bleeding or clotting while on Eliquis. To help avoid this, consult your healthcare provider or pharmacist prior to using any new prescription or non-prescription medications, including herbals, vitamins, non-steroidal anti-inflammatory drugs (NSAIDs) and supplements.  This website has more information on Eliquis (apixaban): http://www.eliquis.com/eliquis/home    -1)You are taking Apixaban/Eliquis which is your blood thinners so please Avoid ibuprofen/Advil/Aleve/Motrin/Goody Powders/Naproxen/BC powders/Meloxicam/Diclofenac/Indomethacin and other Nonsteroidal anti-inflammatory medications as these will make you more likely to bleed and can cause stomach ulcers, can also cause Kidney problems.  2) please call if very dark stools, maroon or mahogany colored stools, or bright  red blood in your stool, please also call if nosebleeds or blood in your urine  3) Very low-salt diet advised 4)Weigh yourself daily, call if you gain more than 3 pounds in 1 day or more than 5 pounds in 1 week  as your diuretic medications may need to be adjusted 5)Limit your Fluid  intake to no more than 60 ounces (1.8 Liters) per day 6) please continue heart medications and keep your appointment with your heart failure specialist Dr. Pierre Bali

## 2020-04-22 NOTE — Discharge Summary (Signed)
[name] [name], is a 59 y.o. female  DOB 02-20-1961  MRN [id number].  Admission date:  04/21/2020  Admitting Physician  Bethena Roys, MD  Discharge Date:  04/22/2020   Primary MD  Janora Norlander, DO  Recommendations for primary care physician for things to follow:   1)You are taking Apixaban/Eliquis which is your blood thinners so please Avoid ibuprofen/Advil/Aleve/Motrin/Goody Powders/Naproxen/BC powders/Meloxicam/Diclofenac/Indomethacin and other Nonsteroidal anti-inflammatory medications as these will make you more likely to bleed and can cause stomach ulcers, can also cause Kidney problems.  2) please call if very dark stools, maroon or mahogany colored stools, or bright red blood in your stool, please also call if nosebleeds or blood in your urine  3) Very low-salt diet advised 4)Weigh yourself daily, call if you gain more than 3 pounds in 1 day or more than 5 pounds in 1 week as your diuretic medications may need to be adjusted 5)Limit your Fluid  intake to no more than 60 ounces (1.8 Liters) per day 6) please continue heart medications and keep your appointment with your heart failure specialist Dr. Pierre Bali  Admission Diagnosis  Pulmonary embolism ([place]) [I26.99] Pulmonary embolus, right [facility]) [I26.99]  Discharge Diagnosis  Pulmonary embolism ([place]) [I26.99] Pulmonary embolus, right ([place]) [I26.99]   Active Problems:   Pulmonary embolism ([place])    Past Medical History:  Diagnosis Date  . AICD (automatic cardioverter/defibrillator) present 09   [employer]- replaced 2016  . Arthritis   . Bradycardia   . Breast cancer ([place]) 02/24/2016   s/p lumpectomy and radiation but refused chemotherapy  . Chronic systolic CHF (congestive heart failure) ([place])   . Cough due to angiotensin-converting enzyme inhibitor   . Dizziness    Dizziness with standing after squatting, March, 2012    . Dyslipidemia    LDL elevation,Patient does not want statin  . Family history of breast cancer   . GERD (gastroesophageal reflux disease)   . H/O shortness of breath    CPX 10/18:  No evidence of cardiopulmonary limitation. Suspect exercise intolerance related to weight and deconditioning.   [name] Hypothyroidism   . ICD (implantable cardiac defibrillator) battery depletion    Dr. [name], June, 2009(artifactual atrial tachycardia response events addressed by reprogramming in parentheses  . Leg swelling    right leg  . Mitral regurgitation   . Nonischemic cardiomyopathy ([place])    Etiology unknown, diagnosed 2008  . Personal history of radiation therapy 2017  . Pneumonia due to COVID-19 virus 10/2019  . TIA (transient ischemic attack)    No CT or MRI abnormality, aspirin therapy    Past Surgical History:  Procedure Laterality Date  . BREAST BIOPSY Left 02/24/2016   U/S Core  . BREAST BIOPSY Left 02/24/2016   U/S Core  . BREAST LUMPECTOMY Left 04/05/2016   invasive ductal   . BREAST LUMPECTOMY WITH RADIOACTIVE SEED AND SENTINEL LYMPH NODE BIOPSY Left 04/05/2016   Procedure: BREAST LUMPECTOMY WITH RADIOACTIVE SEED AND SENTINEL LYMPH NODE BIOPSY;  Surgeon: [name], MD;  Location: [facility] OR;  Service: General;  Laterality: Left;  . CARDIAC CATHETERIZATION N/A 08/05/2016   Procedure: Left Heart Cath and Coronary Angiography;  Surgeon: Peter M Martinique, MD;  Location: [facility] CV LAB;  Service: Cardiovascular;  Laterality: N/A;  . CARDIAC DEFIBRILLATOR PLACEMENT  09   [employer] no remote  . COLONOSCOPY    . CYST EXCISION Left    back of leg-lt   . IMPLANTABLE CARDIOVERTER DEFIBRILLATOR GENERATOR CHANGE N/A 12/29/2014   Procedure: IMPLANTABLE CARDIOVERTER DEFIBRILLATOR GENERATOR CHANGE;  Surgeon: [name], MD;  Location: [facility] CATH LAB;  Service: Cardiovascular;  Laterality: N/A;  . LATERAL EPICONDYLE RELEASE  10/11/2012   Procedure: TENNIS ELBOW RELEASE;  Surgeon: [name], MD;  Location: [facility];  Service: Orthopedics;  Laterality: Right;  RIGHT ELBOW: TENOTOMY ELBOW LATERAL EPICONDYLITIS TENNIS ELBOW: RADIAL TUNNEL RELEASE  . PORT-A-CATH REMOVAL  04/13/2016   Procedure: MINOR REMOVAL PORT-A-CATH;  Surgeon: [name], MD;  Location: [place];  Service: General;;  . PORTACATH PLACEMENT Right 04/05/2016   Procedure: INSERTION PORT-A-CATH WITH [place];  Surgeon: [name], MD;  Location: [place];  Service: General;  Laterality: Right;  right IJ  . TOTAL THYROIDECTOMY  2001  . TUBAL LIGATION  94  . UPPER GI ENDOSCOPY        HPI  from the history and physical done on the day of admission:    HPI: [name] [name] is a 59 y.o. female with medical history significant for systolic congestive heart failure, ICD, breast cancer 2017 status post lumpectomy and radiotherapy, hypothyroidism. Patient presented to the ED with complaints of central chest pains over the past 3 weeks.  She reports mild associated difficulty breathing.  She reports a cough, and postnasal drip and sinus issues.  Patient has been to 3 urgent cares in the interim, she was prescribed Augmentin which she is still taking, and antihistamines, 5-day steroid course which she has been compliant with.  No lower extremity swelling.  She denies any recent trips.  She is not on any hormonal medications.  No personal or family history of blood clots in lungs or legs.  Patient was diagnosed with COVID-19 pneumonia 10/2019, she did not require hospitalization.  She has completed her COVID-19 vaccines.  ED Course:, Blood pressure 120s to 130s, heart rate 60s to 70s, O2 sats greater than 97% on room air.  D-dimer elevated 3.9.  BNP 45.  Troponin within normal limits.  Unremarkable CBC BMP.  CT chest shows subacute to chronic appearing small segmental PE in right upper lobe and subsegmental PE in left lower lobe.  Extremely small overall clot burden.  Hospitalist to admit for pulmonary  embolism.  Review of Systems: As per HPI all other systems reviewed and negative.    Hospital Course:       Pulmonary embolism-no further chest pains or dyspnea, - CTA chest with subacute to chronic PE right upper lobe, left lower lobe. -Repeat echo without heart strain, EF back to 35 to 40% (EF of 35 to 40% back in September 18, 2019, then EF had dropped  in January 2021 when patient was being treated for Covid pneumonia) -Lower extremity Dopplers without DVT -Patient's PE appears unprovoked however patient was recently treated for Covid pneumonia, patient also has a history of Breast ca -Patient was discharged on Eliquis for full anticoagulation, will require Eliquis for at least 6 months may be lifelong  HFrEF--patient with combined systolic and diastolic dysfunction CHF  status post AICD -repeat echo today with EF back to 35 to 40% (EF of 35 to 40% back in September 18, 2019, then EF had dropped in January 2021 when patient was being treated for Covid pneumonia) -.  Follows with Dr. [name], per notes multiple medication intolerances. -Patient on 20 mg Lasix as needed -Resume carvedilol, Entresto as outlined in discharge instructions/med rec  Hypothyroidism-  -Resume Synthroid.  History of breast cancer- 2017.  Follows with Dr. Lindi Adie.  Status post left lumpectomy, radiation therapy, patient refused adjuvant chemotherapy-- Currently she is under surveillance.  Recent sinusitis, postnasal drip -Okay to stop Augmentin  Discharge Condition: stable  Follow UP--- PCP and cardiologist as advised  Diet and Activity recommendation:  As advised  Discharge Instructions    Discharge Instructions    Call MD for:  difficulty breathing, headache or visual disturbances   Complete by: As directed    Call MD for:  extreme fatigue   Complete by: As directed    Call MD for:  hives   Complete by: As directed    Call MD for:  persistant dizziness or light-headedness   Complete by: As  directed    Call MD for:  persistant nausea and vomiting   Complete by: As directed    Call MD for:  severe uncontrolled pain   Complete by: As directed    Call MD for:  temperature >100.4   Complete by: As directed    Diet - low sodium heart healthy   Complete by: As directed    Discharge instructions   Complete by: As directed    1)You are taking Apixaban/Eliquis which is your blood thinners so please Avoid ibuprofen/Advil/Aleve/Motrin/Goody Powders/Naproxen/BC powders/Meloxicam/Diclofenac/Indomethacin and other Nonsteroidal anti-inflammatory medications as these will make you more likely to bleed and can cause stomach ulcers, can also cause Kidney problems.  2) please call if very dark stools, maroon or mahogany colored stools, or bright red blood in your stool, please also call if nosebleeds or blood in your urine  3) Very low-salt diet advised 4)Weigh yourself daily, call if you gain more than 3 pounds in 1 day or more than 5 pounds in 1 week as your diuretic medications may need to be adjusted 5)Limit your Fluid  intake to no more than 60 ounces (1.8 Liters) per day 6) please continue heart medications and keep your appointment with your heart failure specialist Dr. Pierre Bali   Increase activity slowly   Complete by: As directed         Discharge Medications     Allergies as of 04/22/2020      Reactions   Avelox [moxifloxacin Hcl In Nacl] Shortness Of Breath   Shortness of breath   Clindamycin/lincomycin Shortness Of Breath, Diarrhea   Breathing/diarrhea   Kiwi Extract Anaphylaxis   Makes pt. Feel like her throat is closing   Bactrim [sulfamethoxazole-trimethoprim] Swelling   Levofloxacin Other (See Comments)   JOINT PAIN JOINT PAIN   Simvastatin Other (See Comments)   Joint pain   Spironolactone Other (See Comments)   burning      Medication List    STOP taking these medications   amoxicillin-clavulanate 875-125 MG tablet Commonly known as: AUGMENTIN     benzonatate 100 MG capsule Commonly known as: Tessalon Perles   brompheniramine-pseudoephedrine-DM 30-2-10 MG/5ML syrup   mupirocin ointment 2 % Commonly known as: Bactroban   predniSONE 10 MG tablet Commonly known as: DELTASONE     TAKE these medications   acetaminophen 500  MG tablet Commonly known as: TYLENOL Take 500 mg by mouth every 6 (six) hours as needed for mild pain or moderate pain.   albuterol 108 (90 Base) MCG/ACT inhaler Commonly known as: VENTOLIN HFA Inhale 2 puffs into the lungs every 6 (six) hours as needed for wheezing or shortness of breath.   amitriptyline 10 MG tablet Commonly known as: ELAVIL Take 10 mg by mouth every other day. At bedtime   aspirin EC 81 MG tablet Take 1 tablet (81 mg total) by mouth daily with breakfast. What changed: when to take this   carvedilol 6.25 MG tablet Commonly known as: COREG Take 0.5 tablets (3.125 mg total) by mouth 2 (two) times daily.   dicyclomine 10 MG capsule Commonly known as: BENTYL Take 10 mg by mouth 3 (three) times daily before meals.   Eliquis DVT/PE Starter Pack Generic drug: Apixaban Starter Pack (10mg  and 5mg ) Take as directed on package: start with two-5mg  tablets twice daily for 7 days. On day 8, switch to one-5mg  tablet twice daily. -Start on April 23, 2020 Start taking on: April 23, 2020   apixaban 5 MG Tabs tablet Commonly known as: Eliquis Take 1 tablet (5 mg total) by mouth 2 (two) times daily. Start this prescription in about a month after you complete the initial 30-day starter pack dose pack Start taking on: May 22, 2020   Entresto 97-103 MG Generic drug: sacubitril-valsartan Take 1 tablet by mouth 2 (two) times daily.   fluticasone 50 MCG/ACT nasal spray Commonly known as: FLONASE Place 2 sprays into both nostrils daily.   furosemide 20 MG tablet Commonly known as: LASIX Take 1 tablet (20 mg total) by mouth daily as needed. What changed: reasons to take this   levocetirizine 5 MG  tablet Commonly known as: Xyzal Take 1 tablet (5 mg total) by mouth every evening.   levothyroxine 75 MCG tablet Commonly known as: SYNTHROID Take 1 tablet (75 mcg total) by mouth daily. Schedule OV with Dr Darnell Level What changed: additional instructions   montelukast 10 MG tablet Commonly known as: Singulair Take 1 tablet (10 mg total) by mouth at bedtime.   omeprazole 40 MG capsule Commonly known as: PRILOSEC Take 40 mg by mouth daily.       Major procedures and Radiology Reports - PLEASE review detailed and final reports for all details, in brief -   DG Chest 2 View  Result Date: 04/21/2020 CLINICAL DATA:  Chest pain. EXAM: CHEST - 2 VIEW COMPARISON:  October 31, 2019. FINDINGS: Stable cardiomediastinal silhouette. No pneumothorax or pleural effusion is noted. Left-sided pacemaker is unchanged in position. Both lungs are clear. The visualized skeletal structures are unremarkable. IMPRESSION: No active cardiopulmonary disease. Electronically Signed   By: Marijo Conception M.D.   On: 04/21/2020 15:55   CT Angio Chest PE W and/or Wo Contrast  Result Date: 04/21/2020 CLINICAL DATA:  Chest pain and shortness of breath. EXAM: CT ANGIOGRAPHY CHEST WITH CONTRAST TECHNIQUE: Multidetector CT imaging of the chest was performed using the standard protocol during bolus administration of intravenous contrast. Multiplanar CT image reconstructions and MIPs were obtained to evaluate the vascular anatomy. CONTRAST:  117mL OMNIPAQUE IOHEXOL 350 MG/ML SOLN COMPARISON:  Chest x-ray from same day. CTA chest dated September 18, 2019. FINDINGS: Cardiovascular: Satisfactory opacification of the pulmonary arteries to the segmental level. Eccentric, more subacute to chronic appearing small segmental pulmonary emboli in the right upper lobe (series 7, images 68-72) and subsegmental pulmonary emboli in the left lower lobe (  series 4, images 54 and 57). Unchanged left chest wall pacemaker and mild cardiomegaly. No pericardial  effusion. No thoracic aortic aneurysm or dissection. Mediastinum/Nodes: No enlarged mediastinal, hilar, or axillary lymph nodes. Prior thyroidectomy. Trachea and esophagus demonstrate no significant findings. Lungs/Pleura: Lungs are clear. No pleural effusion or pneumothorax. Upper Abdomen: No acute abnormality. 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 Musculoskeletal: No chest wall abnormality. No acute or significant osseous findings. Review of the MIP images confirms the above findings. IMPRESSION: 1. Eccentric, more subacute to chronic appearing small segmental pulmonary emboli in the right upper lobe and subsegmental pulmonary emboli in the left lower lobe. Overall clot burden is extremely small. 2. 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 Electronically Signed   By: Titus Dubin M.D.   On: 04/21/2020 17:15   US Venous Img Lower Bilateral (DVT)  Result Date: 04/22/2020 CLINICAL DATA:  Recent chest CTA demonstrated very small pulmonary emboli of uncertain age. EXAM: BILATERAL LOWER EXTREMITY VENOUS DOPPLER ULTRASOUND TECHNIQUE: Gray-scale sonography with graded compression, as well as color Doppler and duplex ultrasound were performed to evaluate the lower extremity deep venous systems from the level of the common femoral vein and including the common femoral, femoral, profunda femoral, popliteal and calf veins including the posterior tibial, peroneal and gastrocnemius veins when visible. The superficial great saphenous vein was also interrogated. Spectral Doppler was utilized to evaluate flow at rest and with distal augmentation maneuvers in the common femoral, femoral and popliteal veins. COMPARISON:  Chest CTA 04/21/2020 FINDINGS: RIGHT LOWER EXTREMITY Common Femoral Vein: No evidence of thrombus. Normal compressibility, respiratory phasicity and response to augmentation.  Saphenofemoral Junction: No evidence of thrombus. Normal compressibility and flow on color Doppler imaging. Profunda Femoral Vein: No evidence of thrombus. Normal compressibility and flow on color Doppler imaging. Femoral Vein: No evidence of thrombus. Normal compressibility, respiratory phasicity and response to augmentation. Popliteal Vein: No evidence of thrombus. Normal compressibility, respiratory phasicity and response to augmentation. Calf Veins: Visualized right deep calf veins are patent without thrombus. Superficial Great Saphenous Vein: No evidence of thrombus. Normal compressibility. Venous Reflux:  None. Other Findings:  None. LEFT LOWER EXTREMITY Common Femoral Vein: No evidence of thrombus. Normal compressibility, respiratory phasicity and response to augmentation. Saphenofemoral Junction: No evidence of thrombus. Normal compressibility and flow on color Doppler imaging. Profunda Femoral Vein: No evidence of thrombus. Normal compressibility and flow on color Doppler imaging. Femoral Vein: No evidence of thrombus. Normal compressibility, respiratory phasicity and response to augmentation. Popliteal Vein: No evidence of thrombus. Normal compressibility, respiratory phasicity and response to augmentation. Calf Veins: Visualized left deep calf veins are patent without thrombus. Superficial Great Saphenous Vein: No evidence of thrombus. Normal compressibility. Venous Reflux:  None. Other Findings:  None. IMPRESSION: No evidence of deep venous thrombosis in either lower extremity. Electronically Signed   By: Markus Daft M.D.   On: 04/22/2020 09:54   MM DIAG BREAST TOMO BILATERAL  Result Date: 04/01/2020 CLINICAL DATA:  59 year old female status post malignant left lumpectomy with chemotherapy and radiation in 2017. No current symptoms. EXAM: DIGITAL DIAGNOSTIC BILATERAL MAMMOGRAM WITH CAD AND TOMO COMPARISON:  Previous exam(s). ACR Breast Density Category b: There are scattered areas of fibroglandular  density. FINDINGS: Stable postsurgical changes are noted in the upper inner left breast. No new or suspicious findings are identified in either breast. The parenchymal pattern is stable. Mammographic images were processed with CAD. IMPRESSION: 1. No mammographic evidence  of malignancy in either breast. 2. Left breast posttreatment changes. RECOMMENDATION: Diagnostic mammogram is suggested in 1 year. (Code:[id number]-B-01Y) I have discussed the findings and recommendations with the patient. If applicable, a reminder letter will be sent to the patient regarding the next appointment. BI-RADS CATEGORY  2: Benign. Electronically Signed   By: Kristopher Oppenheim M.D.   On: 04/01/2020 16:26   ECHOCARDIOGRAM COMPLETE  Result Date: 04/22/2020    ECHOCARDIOGRAM REPORT   Patient Name:   ALEXZANDRA BILTON Date of Exam: 04/22/2020 Medical Rec #:  [id number]    Height:       68.0 in Accession #:    [id number]   Weight:       222.5 lb Date of Birth:  [birth year]     BSA:          2.138 m Patient Age:    28 years     BP:           116/68 mmHg Patient Gender: F            HR:           70 bpm. Exam Location:  [facility] Procedure: 2D Echo Indications:    Pulmonary Embolus 415.19 / I26.99  History:        Patient has prior history of Echocardiogram examinations, most                 recent 11/15/2019. TIA; Risk Factors:Dyslipidemia and Non-Smoker.                 Bradycardia, Breast Cancer.  Sonographer:    Leavy Cella RDCS (AE) Referring Phys: [id number] Jex Strausbaugh IMPRESSIONS  1. Left ventricular ejection fraction, by estimation, is 35 to 40%. The left ventricle has normal function. The left ventricle has no regional wall motion abnormalities. Left ventricular diastolic parameters are indeterminate.  2. Right ventricular systolic function is normal. The right ventricular size is normal.  3. The mitral valve is normal in structure. No evidence of mitral valve regurgitation. No evidence of mitral stenosis.  4. The aortic valve is tricuspid. Aortic  valve regurgitation is not visualized. No aortic stenosis is present.  5. The inferior vena cava is normal in size with greater than 50% respiratory variability, suggesting right atrial pressure of 3 mmHg. FINDINGS  Left Ventricle: Left ventricular ejection fraction, by estimation, is 35 to 40%. The left ventricle has normal function. The left ventricle has no regional wall motion abnormalities. Definity contrast agent was given IV to delineate the left ventricular  endocardial borders. The left ventricular internal cavity size was normal in size. There is no left ventricular hypertrophy. Left ventricular diastolic parameters are indeterminate. Right Ventricle: The right ventricular size is normal. No increase in right ventricular wall thickness. Right ventricular systolic function is normal. Left Atrium: Left atrial size was normal in size. Right Atrium: Right atrial size was normal in size. Pericardium: There is no evidence of pericardial effusion. Mitral Valve: The mitral valve is normal in structure. No evidence of mitral valve regurgitation. No evidence of mitral valve stenosis. Tricuspid Valve: The tricuspid valve is normal in structure. Tricuspid valve regurgitation is not demonstrated. No evidence of tricuspid stenosis. Aortic Valve: The aortic valve is tricuspid. Aortic valve regurgitation is not visualized. No aortic stenosis is present. Aortic valve mean gradient measures 3.1 mmHg. Aortic valve peak gradient measures 5.5 mmHg. Aortic valve area, by VTI measures 1.80 cm. Pulmonic Valve: The pulmonic valve was not well visualized. Pulmonic valve regurgitation  is not visualized. No evidence of pulmonic stenosis. Aorta: The aortic root is normal in size and structure. Pulmonary Artery: Indeterminant PASP, inadequate TR jet. Venous: The inferior vena cava is normal in size with greater than 50% respiratory variability, suggesting right atrial pressure of 3 mmHg. IAS/Shunts: No atrial level shunt detected by  color flow Doppler.  LEFT VENTRICLE PLAX 2D LVIDd:         5.78 cm  Diastology LVIDs:         4.38 cm  LV e' lateral:   7.18 cm/s LV PW:         1.10 cm  LV E/e' lateral: 7.5 LV IVS:        0.99 cm  LV e' medial:    5.66 cm/s LVOT diam:     2.00 cm  LV E/e' medial:  9.5 LV SV:         39 LV SV Index:   18 LVOT Area:     3.14 cm  RIGHT VENTRICLE RV S prime:     12.10 cm/s TAPSE (M-mode): 1.8 cm LEFT ATRIUM             Index       RIGHT ATRIUM           Index LA diam:        3.10 cm 1.45 cm/m  RA Area:     14.80 cm LA Vol (A2C):   56.5 ml 26.42 ml/m RA Volume:   47.50 ml  22.21 ml/m LA Vol (A4C):   44.5 ml 20.81 ml/m LA Biplane Vol: 49.3 ml 23.05 ml/m  AORTIC VALVE AV Area (Vmax):    1.87 cm AV Area (Vmean):   1.77 cm AV Area (VTI):     1.80 cm AV Vmax:           117.03 cm/s AV Vmean:          84.156 cm/s AV VTI:            0.219 m AV Peak Grad:      5.5 mmHg AV Mean Grad:      3.1 mmHg LVOT Vmax:         69.85 cm/s LVOT Vmean:        47.548 cm/s LVOT VTI:          0.126 m LVOT/AV VTI ratio: 0.57  AORTA Ao Root diam: 3.20 cm MITRAL VALVE MV Area (PHT): 3.53 cm    SHUNTS MV Decel Time: 215 msec    Systemic VTI:  0.13 m MV E velocity: 53.80 cm/s  Systemic Diam: 2.00 cm MV A velocity: 55.80 cm/s MV E/A ratio:  0.96 Carlyle Dolly MD Electronically signed by Carlyle Dolly MD Signature Date/Time: 04/22/2020/3:18:14 PM    Final     Micro Results   Recent Results (from the past 240 hour(s))  SARS Coronavirus 2 by RT PCR (hospital order, performed in [facility] hospital lab) Nasopharyngeal Nasopharyngeal Swab     Status: None   Collection Time: 04/21/20  3:33 PM   Specimen: Nasopharyngeal Swab  Result Value Ref Range Status   SARS Coronavirus 2 NEGATIVE NEGATIVE Final    Comment: (NOTE) SARS-CoV-2 target nucleic acids are NOT DETECTED.  The SARS-CoV-2 RNA is generally detectable in upper and lower respiratory specimens during the acute phase of infection. The lowest concentration of SARS-CoV-2  viral copies this assay can detect is 250 copies / mL. A negative result does not preclude SARS-CoV-2 infection and should not be  used as the sole basis for treatment or other patient management decisions.  A negative result may occur with improper specimen collection / handling, submission of specimen other than nasopharyngeal swab, presence of viral mutation(s) within the areas targeted by this assay, and inadequate number of viral copies (<250 copies / mL). A negative result must be combined with clinical observations, patient history, and epidemiological information.  Fact Sheet for Patients:   StrictlyIdeas.no  Fact Sheet for Healthcare Providers: BankingDealers.co.za  This test is not yet approved or  cleared by the [place] FDA and has been authorized for detection and/or diagnosis of SARS-CoV-2 by FDA under an Emergency Use Authorization (EUA).  This EUA will remain in effect (meaning this test can be used) for the duration of the COVID-19 declaration under Section 564(b)(1) of the Act, 21 U.S.C. section 360bbb-3(b)(1), unless the authorization is terminated or revoked sooner.  Performed at [facility], [address], [place] [address]        Today   Subjective    Sulema Braid today has no new complaints -Dyspnea on exertion, post ambulation no hypoxia, no chest pains          Patient has been seen and examined prior to discharge   Objective   Blood pressure 116/68, pulse 70, temperature 98.4 F (36.9 C), temperature source Oral, resp. rate 18, height 5\' 8"  (1.727 m), weight 100.9 kg, SpO2 99 %.   Intake/Output Summary (Last 24 hours) at 04/22/2020 1823 Last data filed at 04/22/2020 1700 Gross per 24 hour  Intake 480 ml  Output --  Net 480 ml    Exam Gen:- Awake Alert, no acute distress  HEENT:- [place].AT, No sclera icterus Neck-Supple Neck,No JVD,.  Lungs-  CTAB , good air movement bilaterally   CV- S1, S2 normal, regular Abd-  +ve B.Sounds, Abd Soft, No tenderness,    Extremity/Skin:- No  edema,   good pulses Psych-affect is appropriate, oriented x3 Neuro-no new focal deficits, no tremors    Data Review   CBC w Diff:  Lab Results  Component Value Date   WBC 6.0 04/21/2020   HGB 11.4 (L) 04/21/2020   HGB 11.9 09/17/2019   HCT 36.6 04/21/2020   HCT 35.7 09/17/2019   PLT 206 04/21/2020   PLT 202 09/17/2019   LYMPHOPCT 15 10/31/2019   MONOPCT 6 10/31/2019   EOSPCT 0 10/31/2019   BASOPCT 0 10/31/2019    CMP:  Lab Results  Component Value Date   NA 142 04/21/2020   NA 140 02/20/2020   K 3.7 04/21/2020   CL 106 04/21/2020   CO2 26 04/21/2020   BUN 9 04/21/2020   BUN 13 02/20/2020   CREATININE 1.00 04/21/2020   CREATININE 0.93 08/02/2016   PROT 7.1 04/21/2020   PROT 6.3 09/17/2019   ALBUMIN 3.8 04/21/2020   ALBUMIN 4.1 09/17/2019   BILITOT 0.4 04/21/2020   BILITOT 0.3 09/17/2019   ALKPHOS 57 04/21/2020   AST 16 04/21/2020   ALT 16 04/21/2020  .   Total Discharge time is about 33 minutes  Roxan Hockey M.D on 04/22/2020 at 6:23 PM  Go to [email or web address] -  for contact info  [facility] - Office  [phone]

## 2020-04-22 NOTE — Progress Notes (Signed)
*  PRELIMINARY RESULTS* Echocardiogram 2D Echocardiogram has been performed.  Leavy Cella 04/22/2020, 12:20 PM

## 2020-04-22 NOTE — Progress Notes (Signed)
ANTICOAGULATION CONSULT NOTE - Initial Consult  Pharmacy Consult for apixaban Indication: pulmonary embolus  Allergies  Allergen Reactions  . Avelox [Moxifloxacin Hcl In Nacl] Shortness Of Breath    Shortness of breath  . Clindamycin/Lincomycin Shortness Of Breath and Diarrhea    Breathing/diarrhea    . Kiwi Extract Anaphylaxis    Makes pt. Feel like her throat is closing   . Bactrim [Sulfamethoxazole-Trimethoprim] Swelling  . Levofloxacin Other (See Comments)    JOINT PAIN JOINT PAIN  . Simvastatin Other (See Comments)    Joint pain  . Spironolactone Other (See Comments)    burning    Patient Measurements: Height: 5\' 8"  (172.7 cm) Weight: 100.9 kg (222 lb 8 oz) IBW/kg (Calculated) : 63.9   Vital Signs: Temp: 98.4 F (36.9 C) (06/30 0835) Temp Source: Oral (06/30 0835) BP: 116/68 (06/30 0835) Pulse Rate: 70 (06/30 0835)  Labs: Recent Labs    04/21/20 1510 04/21/20 1655  HGB 11.4*  --   HCT 36.6  --   PLT 206  --   CREATININE 1.00  --   TROPONINIHS 3 3    Estimated Creatinine Clearance: 75.3 mL/min (by C-G formula based on SCr of 1 mg/dL).   Medical History: Past Medical History:  Diagnosis Date  . AICD (automatic cardioverter/defibrillator) present 09   [employer]- replaced 2016  . Arthritis   . Bradycardia   . Breast cancer ([place]) 02/24/2016   s/p lumpectomy and radiation but refused chemotherapy  . Chronic systolic CHF (congestive heart failure) ([place])   . Cough due to angiotensin-converting enzyme inhibitor   . Dizziness    Dizziness with standing after squatting, March, 2012  . Dyslipidemia    LDL elevation,Patient does not want statin  . Family history of breast cancer   . GERD (gastroesophageal reflux disease)   . H/O shortness of breath    CPX 10/18:  No evidence of cardiopulmonary limitation. Suspect exercise intolerance related to weight and deconditioning.   [name] Hypothyroidism   . ICD (implantable cardiac defibrillator) battery  depletion    Dr. [name], June, 2009(artifactual atrial tachycardia response events addressed by reprogramming in parentheses  . Leg swelling    right leg  . Mitral regurgitation   . Nonischemic cardiomyopathy ([place])    Etiology unknown, diagnosed 2008  . Personal history of radiation therapy 2017  . Pneumonia due to COVID-19 virus 10/2019  . TIA (transient ischemic attack)    No CT or MRI abnormality, aspirin therapy    Medications:  Medications Prior to Admission  Medication Sig Dispense Refill Last Dose  . acetaminophen (TYLENOL) 500 MG tablet Take 500 mg by mouth every 6 (six) hours as needed for mild pain or moderate pain.   04/20/2020 at Unknown time  . albuterol (VENTOLIN HFA) 108 (90 Base) MCG/ACT inhaler Inhale 2 puffs into the lungs every 6 (six) hours as needed for wheezing or shortness of breath. 18 g 0 Past Week at Unknown time  . amitriptyline (ELAVIL) 10 MG tablet Take 10 mg by mouth every other day. At bedtime   04/19/2020 at Unknown time  . amoxicillin-clavulanate (AUGMENTIN) 875-125 MG tablet Take 1 tablet by mouth 2 (two) times daily. 10 day course starting on 04/16/2020   04/20/2020 at Unknown time  . aspirin EC 81 MG tablet Take 1 tablet (81 mg total) by mouth daily. 90 tablet 3 04/20/2020 at Unknown time  . brompheniramine-pseudoephedrine-DM 30-2-10 MG/5ML syrup Take 5 mLs by mouth 4 (four) times daily as needed (for cough).  04/17/2020 at Unknown time  . carvedilol (COREG) 6.25 MG tablet Take 0.5 tablets (3.125 mg total) by mouth 2 (two) times daily. 90 tablet 2 04/20/2020 at 2300  . dicyclomine (BENTYL) 10 MG capsule Take 10 mg by mouth 3 (three) times daily before meals.    04/20/2020 at Unknown time  . fluticasone (FLONASE) 50 MCG/ACT nasal spray Place 2 sprays into both nostrils daily. 9.9 mL 2 04/21/2020 at Unknown time  . furosemide (LASIX) 20 MG tablet Take 1 tablet (20 mg total) by mouth daily as needed. (Patient taking differently: Take 20 mg by mouth daily as needed  for fluid. ) 90 tablet 2 04/20/2020 at Unknown time  . levocetirizine (XYZAL) 5 MG tablet Take 1 tablet (5 mg total) by mouth every evening. 30 tablet 0 04/20/2020 at Unknown time  . levothyroxine (SYNTHROID) 75 MCG tablet Take 1 tablet (75 mcg total) by mouth daily. Schedule OV with Dr Darnell Level (Patient taking differently: Take 75 mcg by mouth daily. ) 90 tablet 0 04/21/2020 at Unknown time  . omeprazole (PRILOSEC) 40 MG capsule Take 40 mg by mouth daily.   04/21/2020 at Unknown time  . sacubitril-valsartan (ENTRESTO) 97-103 MG Take 1 tablet by mouth 2 (two) times daily. 60 tablet 6 04/20/2020 at Unknown time  . benzonatate (TESSALON PERLES) 100 MG capsule Take 1 capsule (100 mg total) by mouth every 6 (six) hours as needed for cough. (Patient not taking: Reported on 04/21/2020) 30 capsule 0 Not Taking at Unknown time  . montelukast (SINGULAIR) 10 MG tablet Take 1 tablet (10 mg total) by mouth at bedtime. 30 tablet 0   . mupirocin ointment (BACTROBAN) 2 % Apply 1 application topically 2 (two) times daily. x10 days (Patient not taking: Reported on 04/21/2020) 22 g 0 Not Taking at Unknown time  . predniSONE (DELTASONE) 10 MG tablet Take 10 mg by mouth 2 (two) times daily. (Patient not taking: Reported on 04/21/2020)   Completed Course at Unknown time     Goal of Therapy:   Monitor platelets by anticoagulation protocol: Yes   Plan:  Stop lovenox. Start apixaban 10 mg BID x 7 days followed by apixaban 5 mg BID thereafter. Monitor H&H and s/s of bleeding.  Revonda Standard Telesa Jeancharles 04/22/2020,4:33 PM

## 2020-04-22 NOTE — Progress Notes (Signed)
IV removed, 2x2 gauze and paper tape applied to site, patient tolerated well.  Reviewed AVS with patient and patient's daughter who verbalized understanding.  Patient taken to front lobby via wheelchair and transported home by her daughter.

## 2020-04-24 ENCOUNTER — Encounter: Payer: BC Managed Care – PPO | Admitting: Internal Medicine

## 2020-04-30 ENCOUNTER — Telehealth: Payer: Self-pay | Admitting: Family Medicine

## 2020-04-30 NOTE — Telephone Encounter (Signed)
Pt was in the hospital for blood clots last week and needing a hospital follow up. Pt states before the end of the month.

## 2020-04-30 NOTE — Telephone Encounter (Signed)
Appt made for 05/22/20

## 2020-05-01 ENCOUNTER — Other Ambulatory Visit: Payer: Self-pay | Admitting: Family Medicine

## 2020-05-06 DIAGNOSIS — R0982 Postnasal drip: Secondary | ICD-10-CM | POA: Diagnosis not present

## 2020-05-06 DIAGNOSIS — R05 Cough: Secondary | ICD-10-CM | POA: Diagnosis not present

## 2020-05-06 DIAGNOSIS — J310 Chronic rhinitis: Secondary | ICD-10-CM | POA: Diagnosis not present

## 2020-05-14 ENCOUNTER — Ambulatory Visit (INDEPENDENT_AMBULATORY_CARE_PROVIDER_SITE_OTHER): Payer: BC Managed Care – PPO | Admitting: *Deleted

## 2020-05-14 DIAGNOSIS — I428 Other cardiomyopathies: Secondary | ICD-10-CM | POA: Diagnosis not present

## 2020-05-14 LAB — CUP PACEART REMOTE DEVICE CHECK
Battery Remaining Longevity: 84 mo
Battery Remaining Percentage: 100 %
Brady Statistic RA Percent Paced: 0 %
Brady Statistic RV Percent Paced: 0 %
Date Time Interrogation Session: 20210721142300
HighPow Impedance: 47 Ohm
Implantable Lead Implant Date: 20090320
Implantable Lead Implant Date: 20090320
Implantable Lead Location: 753859
Implantable Lead Location: 753860
Implantable Lead Model: 158
Implantable Lead Model: 5076
Implantable Lead Serial Number: 182504
Implantable Pulse Generator Implant Date: 20160307
Lead Channel Impedance Value: 452 Ohm
Lead Channel Impedance Value: 514 Ohm
Lead Channel Pacing Threshold Amplitude: 0.4 V
Lead Channel Pacing Threshold Amplitude: 0.9 V
Lead Channel Pacing Threshold Pulse Width: 0.4 ms
Lead Channel Pacing Threshold Pulse Width: 0.4 ms
Lead Channel Setting Pacing Amplitude: 2 V
Lead Channel Setting Pacing Amplitude: 2.5 V
Lead Channel Setting Pacing Pulse Width: 0.4 ms
Lead Channel Setting Sensing Sensitivity: 0.6 mV
Pulse Gen Serial Number: 111826

## 2020-05-18 NOTE — Progress Notes (Signed)
Remote ICD transmission.   

## 2020-05-22 ENCOUNTER — Ambulatory Visit: Payer: BC Managed Care – PPO | Admitting: Family Medicine

## 2020-05-22 ENCOUNTER — Other Ambulatory Visit: Payer: Self-pay

## 2020-05-22 ENCOUNTER — Encounter: Payer: Self-pay | Admitting: Family Medicine

## 2020-05-22 VITALS — BP 107/65 | HR 99 | Temp 98.0°F | Ht 68.0 in | Wt 228.8 lb

## 2020-05-22 DIAGNOSIS — I2699 Other pulmonary embolism without acute cor pulmonale: Secondary | ICD-10-CM

## 2020-05-22 NOTE — Progress Notes (Signed)
Subjective: CC: PE PCP: Janora Norlander, DO OQH:UTMLY Jerilynn Mages Mcweeney is a 59 y.o. female presenting to clinic today for:  1. PE, right and left Patient with bilateral pulmonary embolism with low clot burden.  Uncertain as to what the inciting event was.  She is 4 years from her cancer diagnosis and status post lumpectomy, radiation therapy.  She is under surveillance by Dr. Lindi Adie for this.  No preceding travel, medications which may have induced.  She was several months from her Covid infection.  She reports that overall she seems to be doing better.  She has completed a month of anticoagulation.  She denies any hematochezia, melena, hematuria or vaginal bleeding.  No epistaxis.  No chest pain.  She does still have some dyspnea with moderate exertion.  No hemoptysis.   ROS: Per HPI  Allergies  Allergen Reactions  . Avelox [Moxifloxacin Hcl In Nacl] Shortness Of Breath    Shortness of breath  . Clindamycin/Lincomycin Shortness Of Breath and Diarrhea    Breathing/diarrhea    . Kiwi Extract Anaphylaxis    Makes pt. Feel like her throat is closing   . Bactrim [Sulfamethoxazole-Trimethoprim] Swelling  . Levofloxacin Other (See Comments)    JOINT PAIN JOINT PAIN  . Simvastatin Other (See Comments)    Joint pain  . Spironolactone Other (See Comments)    burning   Past Medical History:  Diagnosis Date  . AICD (automatic cardioverter/defibrillator) present 09   [employer]- replaced 2016  . Arthritis   . Bradycardia   . Breast cancer ([place]) 02/24/2016   s/p lumpectomy and radiation but refused chemotherapy  . Chronic systolic CHF (congestive heart failure) ([place])   . Cough due to angiotensin-converting enzyme inhibitor   . Dizziness    Dizziness with standing after squatting, March, 2012  . Dyslipidemia    LDL elevation,Patient does not want statin  . Family history of breast cancer   . GERD (gastroesophageal reflux disease)   . H/O shortness of breath    CPX 10/18:  No  evidence of cardiopulmonary limitation. Suspect exercise intolerance related to weight and deconditioning.   [name] Hypothyroidism   . ICD (implantable cardiac defibrillator) battery depletion    Dr. [name], June, 2009(artifactual atrial tachycardia response events addressed by reprogramming in parentheses  . Leg swelling    right leg  . Mitral regurgitation   . Nonischemic cardiomyopathy ([place])    Etiology unknown, diagnosed 2008  . Personal history of radiation therapy 2017  . Pneumonia due to COVID-19 virus 10/2019  . TIA (transient ischemic attack)    No CT or MRI abnormality, aspirin therapy    Current Outpatient Medications:  .  acetaminophen (TYLENOL) 500 MG tablet, Take 500 mg by mouth every 6 (six) hours as needed for mild pain or moderate pain., Disp: , Rfl:  .  albuterol (VENTOLIN HFA) 108 (90 Base) MCG/ACT inhaler, Inhale 2 puffs into the lungs every 6 (six) hours as needed for wheezing or shortness of breath., Disp: 18 g, Rfl: 0 .  amitriptyline (ELAVIL) 10 MG tablet, Take 10 mg by mouth every other day. At bedtime, Disp: , Rfl:  .  apixaban (ELIQUIS) 5 MG TABS tablet, Take 1 tablet (5 mg total) by mouth 2 (two) times daily. Start this prescription in about a month after you complete the initial 30-day starter pack dose pack, Disp: 60 tablet, Rfl: 5 .  Apixaban Starter Pack, 10mg  and 5mg , (ELIQUIS DVT/PE STARTER PACK), Take as directed on package: start with  two-5mg  tablets twice daily for 7 days. On day 8, switch to one-5mg  tablet twice daily. -Start on April 23, 2020, Disp: 1 each, Rfl: 0 .  aspirin EC 81 MG tablet, Take 1 tablet (81 mg total) by mouth daily with breakfast., Disp: 90 tablet, Rfl: 3 .  carvedilol (COREG) 6.25 MG tablet, Take 0.5 tablets (3.125 mg total) by mouth 2 (two) times daily., Disp: 90 tablet, Rfl: 2 .  dicyclomine (BENTYL) 10 MG capsule, Take 10 mg by mouth 3 (three) times daily before meals. , Disp: , Rfl:  .  fluticasone (FLONASE) 50 MCG/ACT nasal spray, Place  2 sprays into both nostrils daily., Disp: 9.9 mL, Rfl: 2 .  furosemide (LASIX) 20 MG tablet, Take 1 tablet (20 mg total) by mouth daily as needed. (Patient taking differently: Take 20 mg by mouth daily as needed for fluid. ), Disp: 90 tablet, Rfl: 2 .  levocetirizine (XYZAL) 5 MG tablet, Take 1 tablet (5 mg total) by mouth every evening., Disp: 30 tablet, Rfl: 0 .  levothyroxine (SYNTHROID) 75 MCG tablet, TAKE 1 TABLET BY MOUTH  DAILY, Disp: 90 tablet, Rfl: 1 .  omeprazole (PRILOSEC) 40 MG capsule, Take 40 mg by mouth daily., Disp: , Rfl:  .  sacubitril-valsartan (ENTRESTO) 97-103 MG, Take 1 tablet by mouth 2 (two) times daily., Disp: 60 tablet, Rfl: 6 .  montelukast (SINGULAIR) 10 MG tablet, Take 1 tablet (10 mg total) by mouth at bedtime., Disp: 30 tablet, Rfl: 0 Social History   Socioeconomic History  . Marital status: Widowed    Spouse name: Not on file  . Number of children: Not on file  . Years of education: Not on file  . Highest education level: Not on file  Occupational History  . Occupation: Widow  Tobacco Use  . Smoking status: Never Smoker  . Smokeless tobacco: Never Used  Vaping Use  . Vaping Use: Never used  Substance and Sexual Activity  . Alcohol use: No  . Drug use: No  . Sexual activity: Not on file  Other Topics Concern  . Not on file  Social History Narrative  . Not on file   Social Determinants of Health   Financial Resource Strain: Medium Risk  . Difficulty of Paying Living Expenses: Somewhat hard  Food Insecurity:   . Worried About Charity fundraiser in the Last Year:   . Arboriculturist in the Last Year:   Transportation Needs: No Transportation Needs  . Lack of Transportation (Medical): No  . Lack of Transportation (Non-Medical): No  Physical Activity: Insufficiently Active  . Days of Exercise per Week: 1 day  . Minutes of Exercise per Session: 10 min  Stress:   . Feeling of Stress :   Social Connections:   . Frequency of Communication with  Friends and Family:   . Frequency of Social Gatherings with Friends and Family:   . Attends Religious Services:   . Active Member of Clubs or Organizations:   . Attends Archivist Meetings:   [name] Marital Status:   Intimate Partner Violence:   . Fear of Current or Ex-Partner:   . Emotionally Abused:   [name] Physically Abused:   . Sexually Abused:    Family History  Problem Relation Age of Onset  . Heart attack Mother   . Cancer Mother        breast  . Breast cancer Mother 35  . Hypertension Sister   . Hypertension Brother   . Cancer  Maternal Aunt        breast  . Breast cancer Maternal Aunt   . Cancer Paternal Grandmother        possible cancer, unknown type  . Stroke Neg Hx   . Diabetes Neg Hx     Objective: Office vital signs reviewed. BP 107/65   Pulse 99   Temp 98 F (36.7 C)   Ht 5\' 8"  (1.727 m)   Wt (!) 228 lb 12.8 oz (103.8 kg)   SpO2 97%   BMI 34.79 kg/m   Physical Examination:  General: Awake, alert, well nourished, No acute distress HEENT: Normal, sclera white, MMM Cardio: regular rate and rhythm, S1S2 heard, no murmurs appreciated Pulm: clear to auscultation bilaterally, no wheezes, rhonchi or rales; normal work of breathing on room air  Assessment/ Plan: 59 y.o. female   Right pulmonary embolus (HCC)  Continue Eliquis 5 mg twice daily.  I gave her a coupon card today.  I have confirmed with the pharmacy that they do have a prescription that was sent over by the hospitalist and will be ready for pickup today.  I will reach out to her current oncologist, Dr. Lindi Adie, to see if perhaps they may be able to see her for this pulmonary embolism as well.  I would be interested to see if she has any underlying coagulopathies.  The timing of her Covid infection does not quite fit her current state.  She has been free from breast cancer to my knowledge for a while.  There are no other apparent inciting events.  No orders of the defined types were placed in  this encounter.  No orders of the defined types were placed in this encounter.    Janora Norlander, DO [place] [phone]

## 2020-05-27 ENCOUNTER — Telehealth: Payer: Self-pay | Admitting: Hematology and Oncology

## 2020-05-27 ENCOUNTER — Encounter: Payer: Self-pay | Admitting: Hematology and Oncology

## 2020-05-27 NOTE — Telephone Encounter (Signed)
Scheduled appt per 8/2 sch msg - unable to reach pt . Left message for patient with appt date and time

## 2020-06-01 ENCOUNTER — Ambulatory Visit: Payer: BC Managed Care – PPO | Admitting: Hematology and Oncology

## 2020-06-03 ENCOUNTER — Encounter: Payer: Self-pay | Admitting: Family Medicine

## 2020-06-04 NOTE — Telephone Encounter (Signed)
Dr. Darnell Level, patient called in to make appointment and we have no appointments available for her to be seen.  She wants to see you only.  Do you have somewhere in your schedule you'd like [place] to work her in?  She is aware you are out of the office until tomorrow.

## 2020-06-05 ENCOUNTER — Ambulatory Visit: Payer: BC Managed Care – PPO | Admitting: Family Medicine

## 2020-06-05 ENCOUNTER — Other Ambulatory Visit: Payer: Self-pay

## 2020-06-05 ENCOUNTER — Encounter: Payer: Self-pay | Admitting: Family Medicine

## 2020-06-05 VITALS — BP 112/78 | HR 78 | Temp 97.8°F | Ht 68.0 in | Wt 226.0 lb

## 2020-06-05 DIAGNOSIS — L299 Pruritus, unspecified: Secondary | ICD-10-CM

## 2020-06-05 MED ORDER — PREDNISONE 10 MG (21) PO TBPK: ORAL_TABLET | ORAL | 0 refills | Status: DC

## 2020-06-05 NOTE — Progress Notes (Signed)
Subjective: CC: Follow-up pruritus PCP: Janora Norlander, DO [name] [name] is a 59 y.o. female presenting to clinic today for:  1.  Pruritus Patient reports about a 2-week history of generalized pruritus.  Symptoms do seem to be worse after being in the heat or hot bath.  She is not seen any actual skin abruptions.  She wonders if this is at all related to her use of Eliquis.  However, she has been on Eliquis for several months following a pulmonary embolism.  She has had no other changes in medications, diet.  She denies any oral swelling, oral itching or facial swelling.  No change in breathing.  She is taking Claritin but this is not helping.   ROS: Per HPI  Allergies  Allergen Reactions  . Avelox [Moxifloxacin Hcl In Nacl] Shortness Of Breath    Shortness of breath  . Clindamycin/Lincomycin Shortness Of Breath and Diarrhea    Breathing/diarrhea    . Kiwi Extract Anaphylaxis    Makes pt. Feel like her throat is closing   . Bactrim [Sulfamethoxazole-Trimethoprim] Swelling  . Levofloxacin Other (See Comments)    JOINT PAIN JOINT PAIN  . Simvastatin Other (See Comments)    Joint pain  . Spironolactone Other (See Comments)    burning   Past Medical History:  Diagnosis Date  . AICD (automatic cardioverter/defibrillator) present 09   [employer]- replaced 2016  . Arthritis   . Bradycardia   . Breast cancer ([place]) 02/24/2016   s/p lumpectomy and radiation but refused chemotherapy  . Chronic systolic CHF (congestive heart failure) ([place])   . Cough due to angiotensin-converting enzyme inhibitor   . Dizziness    Dizziness with standing after squatting, March, 2012  . Dyslipidemia    LDL elevation,Patient does not want statin  . Family history of breast cancer   . GERD (gastroesophageal reflux disease)   . H/O shortness of breath    CPX 10/18:  No evidence of cardiopulmonary limitation. Suspect exercise intolerance related to weight and deconditioning.   [name]  Hypothyroidism   . ICD (implantable cardiac defibrillator) battery depletion    Dr. [name], June, 2009(artifactual atrial tachycardia response events addressed by reprogramming in parentheses  . Leg swelling    right leg  . Mitral regurgitation   . Nonischemic cardiomyopathy ([place])    Etiology unknown, diagnosed 2008  . Personal history of radiation therapy 2017  . Pneumonia due to COVID-19 virus 10/2019  . TIA (transient ischemic attack)    No CT or MRI abnormality, aspirin therapy    Current Outpatient Medications:  .  acetaminophen (TYLENOL) 500 MG tablet, Take 500 mg by mouth every 6 (six) hours as needed for mild pain or moderate pain., Disp: , Rfl:  .  albuterol (VENTOLIN HFA) 108 (90 Base) MCG/ACT inhaler, Inhale 2 puffs into the lungs every 6 (six) hours as needed for wheezing or shortness of breath., Disp: 18 g, Rfl: 0 .  amitriptyline (ELAVIL) 10 MG tablet, Take 10 mg by mouth every other day. At bedtime, Disp: , Rfl:  .  apixaban (ELIQUIS) 5 MG TABS tablet, Take 1 tablet (5 mg total) by mouth 2 (two) times daily. Start this prescription in about a month after you complete the initial 30-day starter pack dose pack, Disp: 60 tablet, Rfl: 5 .  aspirin EC 81 MG tablet, Take 1 tablet (81 mg total) by mouth daily with breakfast., Disp: 90 tablet, Rfl: 3 .  carvedilol (COREG) 6.25 MG tablet, Take 0.5 tablets (  3.125 mg total) by mouth 2 (two) times daily., Disp: 90 tablet, Rfl: 2 .  dicyclomine (BENTYL) 10 MG capsule, Take 10 mg by mouth 3 (three) times daily before meals. , Disp: , Rfl:  .  fluticasone (FLONASE) 50 MCG/ACT nasal spray, Place 2 sprays into both nostrils daily., Disp: 9.9 mL, Rfl: 2 .  furosemide (LASIX) 20 MG tablet, Take 1 tablet (20 mg total) by mouth daily as needed. (Patient taking differently: Take 20 mg by mouth daily as needed for fluid. ), Disp: 90 tablet, Rfl: 2 .  levocetirizine (XYZAL) 5 MG tablet, Take 1 tablet (5 mg total) by mouth every evening., Disp: 30  tablet, Rfl: 0 .  levothyroxine (SYNTHROID) 75 MCG tablet, TAKE 1 TABLET BY MOUTH  DAILY, Disp: 90 tablet, Rfl: 1 .  omeprazole (PRILOSEC) 40 MG capsule, Take 40 mg by mouth daily., Disp: , Rfl:  .  sacubitril-valsartan (ENTRESTO) 97-103 MG, Take 1 tablet by mouth 2 (two) times daily., Disp: 60 tablet, Rfl: 6 Social History   Socioeconomic History  . Marital status: Widowed    Spouse name: Not on file  . Number of children: Not on file  . Years of education: Not on file  . Highest education level: Not on file  Occupational History  . Occupation: Widow  Tobacco Use  . Smoking status: Never Smoker  . Smokeless tobacco: Never Used  Vaping Use  . Vaping Use: Never used  Substance and Sexual Activity  . Alcohol use: No  . Drug use: No  . Sexual activity: Not on file  Other Topics Concern  . Not on file  Social History Narrative  . Not on file   Social Determinants of Health   Financial Resource Strain: Medium Risk  . Difficulty of Paying Living Expenses: Somewhat hard  Food Insecurity:   . Worried About Charity fundraiser in the Last Year:   . Arboriculturist in the Last Year:   Transportation Needs: No Transportation Needs  . Lack of Transportation (Medical): No  . Lack of Transportation (Non-Medical): No  Physical Activity: Insufficiently Active  . Days of Exercise per Week: 1 day  . Minutes of Exercise per Session: 10 min  Stress:   . Feeling of Stress :   Social Connections:   . Frequency of Communication with Friends and Family:   . Frequency of Social Gatherings with Friends and Family:   . Attends Religious Services:   . Active Member of Clubs or Organizations:   . Attends Archivist Meetings:   [name] Marital Status:   Intimate Partner Violence:   . Fear of Current or Ex-Partner:   . Emotionally Abused:   [name] Physically Abused:   . Sexually Abused:    Family History  Problem Relation Age of Onset  . Heart attack Mother   . Cancer Mother        breast   . Breast cancer Mother 23  . Hypertension Sister   . Hypertension Brother   . Cancer Maternal Aunt        breast  . Breast cancer Maternal Aunt   . Cancer Paternal Grandmother        possible cancer, unknown type  . Stroke Neg Hx   . Diabetes Neg Hx     Objective: Office vital signs reviewed. BP 112/78   Pulse 78   Temp 97.8 F (36.6 C) (Temporal)   Ht _0  (1.727 m)   Wt 226 lb (102.5 kg)  SpO2 96%   BMI 34.36 kg/m   Physical Examination:  General: Awake, alert, well nourished, No acute distress HEENT: Normal; no facial swelling.  No oral mucosal edema Cardio: regular rate   Pulm: Normal work of breathing on room air.  No wheezes or respiratory distress appreciated Skin: No appreciable lesions, rashes or swelling  Assessment/ Plan: 59 y.o. female   1. Pruritic disorder Uncertain etiology.  I do not see an actual rash on this patient's body.  I cannot find in the literature any evidence of Eliquis being causative.  I did consult with our clinical pharmacist and she was able to find 2 case studies where the patient had generalized pruritus approximately 2 hours following Eliquis dosing.  However, given the fact that she was on Eliquis for several months before this started I am not entirely sure that this is actually related to Eliquis use.  I am going to evaluate for possible cholangitis given increased pruritus after bathing.  May need to consider obtaining of right upper quadrant ultrasound pending these lab results.  In the interim I am going to place her on oral prednisone to see if perhaps this might help.  Continue oral antihistamine. - CBC with Differential - CMP14+EGFR - Protime-INR - predniSONE (STERAPRED UNI-PAK 21 TAB) 10 MG (21) TBPK tablet; As directed x 6 days  Dispense: 21 tablet; Refill: 0   Orders Placed This Encounter  Procedures  . CBC with Differential  . CMP14+EGFR  . Protime-INR   No orders of the defined types were placed in this  encounter.    Janora Norlander, DO [place] [phone]

## 2020-06-05 NOTE — Patient Instructions (Addendum)
I'm looking for cholangitis as possible etiology of your symptoms. I have sent in prednisone to see if this will help Continue the Claritin.

## 2020-06-06 LAB — CBC WITH DIFFERENTIAL/PLATELET
Basophils Absolute: 0.1 10*3/uL (ref 0.0–0.2)
Basos: 1 %
EOS (ABSOLUTE): 0.2 10*3/uL (ref 0.0–0.4)
Eos: 5 %
Hematocrit: 33.9 % — ABNORMAL LOW (ref 34.0–46.6)
Hemoglobin: 11.4 g/dL (ref 11.1–15.9)
Immature Grans (Abs): 0 10*3/uL (ref 0.0–0.1)
Immature Granulocytes: 0 %
Lymphocytes Absolute: 1.6 10*3/uL (ref 0.7–3.1)
Lymphs: 34 %
MCH: 30.6 pg (ref 26.6–33.0)
MCHC: 33.6 g/dL (ref 31.5–35.7)
MCV: 91 fL (ref 79–97)
Monocytes Absolute: 0.4 10*3/uL (ref 0.1–0.9)
Monocytes: 8 %
Neutrophils Absolute: 2.4 10*3/uL (ref 1.4–7.0)
Neutrophils: 52 %
Platelets: 234 10*3/uL (ref 150–450)
RBC: 3.72 x10E6/uL — ABNORMAL LOW (ref 3.77–5.28)
RDW: 12.9 % (ref 11.7–15.4)
WBC: 4.7 10*3/uL (ref 3.4–10.8)

## 2020-06-06 LAB — CMP14+EGFR
ALT: 9 IU/L (ref 0–32)
AST: 14 IU/L (ref 0–40)
Albumin/Globulin Ratio: 1.8 (ref 1.2–2.2)
Albumin: 4.2 g/dL (ref 3.8–4.9)
Alkaline Phosphatase: 63 IU/L (ref 48–121)
BUN/Creatinine Ratio: 10 — ABNORMAL LOW (ref 12–28)
BUN: 9 mg/dL (ref 8–27)
Bilirubin Total: 0.2 mg/dL (ref 0.0–1.2)
CO2: 24 mmol/L (ref 20–29)
Calcium: 9.3 mg/dL (ref 8.7–10.3)
Chloride: 107 mmol/L — ABNORMAL HIGH (ref 96–106)
Creatinine, Ser: 0.9 mg/dL (ref 0.57–1.00)
GFR calc Af Amer: 80 mL/min/{1.73_m2} (ref >59)
GFR calc non Af Amer: 70 mL/min/{1.73_m2} (ref >59)
Globulin, Total: 2.4 g/dL (ref 1.5–4.5)
Glucose: 93 mg/dL (ref 65–99)
Potassium: 4.2 mmol/L (ref 3.5–5.2)
Sodium: 142 mmol/L (ref 134–144)
Total Protein: 6.6 g/dL (ref 6.0–8.5)

## 2020-06-06 LAB — PROTIME-INR
INR: 0.9 (ref 0.9–1.2)
Prothrombin Time: 9.8 s (ref 9.1–12.0)

## 2020-06-09 ENCOUNTER — Other Ambulatory Visit: Payer: Self-pay | Admitting: Family Medicine

## 2020-06-09 ENCOUNTER — Encounter: Payer: Self-pay | Admitting: Family Medicine

## 2020-06-09 DIAGNOSIS — I2699 Other pulmonary embolism without acute cor pulmonale: Secondary | ICD-10-CM

## 2020-06-09 MED ORDER — RIVAROXABAN 20 MG PO TABS: 20.0000 mg | ORAL_TABLET | Freq: Every day | ORAL | 1 refills | Status: DC

## 2020-06-11 DIAGNOSIS — M4716 Other spondylosis with myelopathy, lumbar region: Secondary | ICD-10-CM | POA: Diagnosis not present

## 2020-06-11 DIAGNOSIS — M461 Sacroiliitis, not elsewhere classified: Secondary | ICD-10-CM | POA: Diagnosis not present

## 2020-06-16 ENCOUNTER — Telehealth: Payer: Self-pay | Admitting: Hematology and Oncology

## 2020-06-16 DIAGNOSIS — L501 Idiopathic urticaria: Secondary | ICD-10-CM | POA: Diagnosis not present

## 2020-06-16 NOTE — Telephone Encounter (Signed)
Rescheduled per 8/24 sch message. Pt is aware of appt time and date.

## 2020-06-25 ENCOUNTER — Ambulatory Visit: Payer: BC Managed Care – PPO | Admitting: Hematology and Oncology

## 2020-06-29 NOTE — Progress Notes (Signed)
Patient Care Team: Janora Norlander, DO as PCP - General (Family Medicine) [name], MD as PCP - Electrophysiology (Cardiology) Dorothy Spark, MD as PCP - Cardiology (Cardiology) Nicholas Lose, MD as Consulting Physician (Hematology and Oncology) Kyung Rudd, MD as Consulting Physician (Radiation Oncology) [name], MD as Consulting Physician (General Surgery)  DIAGNOSIS:    ICD-10-CM   1. Malignant neoplasm of upper-inner quadrant of left breast in female, estrogen receptor negative ([place])  C50.212    Z17.1     SUMMARY OF ONCOLOGIC HISTORY: Oncology History Overview Note      Breast cancer of upper-inner quadrant of left female breast ([place])  02/24/2016 Mammogram   Left breast mass 11:00 position 4 cm from nipple, 2.4 x 1.6 x 1.9 cm, single axillary lymph node noted, T2 N0 stage IIA clinical stage ([place])   02/24/2016 Initial Diagnosis   Moderate to poorly differentiated IDC, lymph node biopsy negative, ER 0%, PR 0%, HER-2, 2+ by IHC, fish negative, ratio 1.2, Ki-67 59%    04/05/2016 Surgery   Left lumpectomy [name]): IDC grade 3, 2.3 cm, associated high-grade DCIS, margins negative, 0/1 lymph node, T2 N0 stage II a, ER 0%, PR 0%, HER-2 negative ratio 1.38    Chemotherapy   Patient refused adjuvant chemotherapy   05/03/2016 - 06/16/2016 Radiation Therapy   Adjuvant radiation [name]). Left breast boost // 10 Gy with 5 fractions at a dose of 2 Gy/fraction.  Left breast// 50.4 Gy with 28 fractions at a dose of 1.8 Gy/ fraction   10/28/2016 Genetic Testing   Patient has genetic testing done for personal and family history of breast cancer. ATM [id number] VUS found on genetic testing.  The Breast/GYN gene panel offered by [facility] includes sequencing and rearrangement analysis for the following 23 genes:  ATM, BARD1, BRCA1, BRCA2, BRIP1, CDH1, CHEK2, EPCAM, FANCC, MLH1, MSH2, MSH6, MUTYH, NBN, NF1, PALB2, PMS2, POLD1, PTEN, RAD51C, RAD51D, RECQL, and TP53.   Negative  genetic testing for the MSH2 inversion analysis ([name]).      CHIEF COMPLIANT: Follow-up of left breast cancer  INTERVAL HISTORY: [name] [name] is a 59 y.o. with above-mentioned history of triple negative left breast cancer who underwent a lumpectomy, adjuvant chemotherapy, radiation, and is currently on surveillance. Mammogram on 04/01/20 showed no evidence of malignancy bilaterally. She presents to the clinic today for annual follow-up.    ALLERGIES:  is allergic to avelox [moxifloxacin hcl in nacl], clindamycin/lincomycin, kiwi extract, bactrim [sulfamethoxazole-trimethoprim], levofloxacin, simvastatin, and spironolactone.  MEDICATIONS:  Current Outpatient Medications  Medication Sig Dispense Refill  . acetaminophen (TYLENOL) 500 MG tablet Take 500 mg by mouth every 6 (six) hours as needed for mild pain or moderate pain.    [name] albuterol (VENTOLIN HFA) 108 (90 Base) MCG/ACT inhaler Inhale 2 puffs into the lungs every 6 (six) hours as needed for wheezing or shortness of breath. 18 g 0  . apixaban (ELIQUIS) 5 MG TABS tablet Take 1 tablet (5 mg total) by mouth 2 (two) times daily. Start this prescription in about a month after you complete the initial 30-day starter pack dose pack 60 tablet 5  . aspirin EC 81 MG tablet Take 1 tablet (81 mg total) by mouth daily with breakfast. 90 tablet 3  . carvedilol (COREG) 6.25 MG tablet Take 0.5 tablets (3.125 mg total) by mouth 2 (two) times daily. 90 tablet 2  . dicyclomine (BENTYL) 10 MG capsule Take 10 mg by mouth 3 (three) times daily before meals.     [name]  fluticasone (FLONASE) 50 MCG/ACT nasal spray Place 2 sprays into both nostrils daily. 9.9 mL 2  . furosemide (LASIX) 20 MG tablet Take 1 tablet (20 mg total) by mouth daily as needed. (Patient taking differently: Take 20 mg by mouth daily as needed for fluid. ) 90 tablet 2  . levothyroxine (SYNTHROID) 75 MCG tablet TAKE 1 TABLET BY MOUTH  DAILY 90 tablet 1  . loratadine (CLARITIN) 10 MG tablet  Take 10 mg by mouth daily.    [name] omeprazole (PRILOSEC) 40 MG capsule Take 40 mg by mouth daily.    . predniSONE (STERAPRED UNI-PAK 21 TAB) 10 MG (21) TBPK tablet As directed x 6 days 21 tablet 0  . rivaroxaban (XARELTO) 20 MG TABS tablet Take 1 tablet (20 mg total) by mouth daily with supper. Put on file. Patient bringing in coupon in 2 weeks. 90 tablet 1  . sacubitril-valsartan (ENTRESTO) 97-103 MG Take 1 tablet by mouth 2 (two) times daily. 60 tablet 6   No current facility-administered medications for this visit.    PHYSICAL EXAMINATION: ECOG PERFORMANCE STATUS: 1 - Symptomatic but completely ambulatory  Vitals:   06/30/20 1517  BP: 107/84  Pulse: 94  Resp: 19  Temp: 97.6 F (36.4 C)  SpO2: 97%   Filed Weights   06/30/20 1517  Weight: 233 lb 1.6 oz (105.7 kg)    BREAST: No palpable masses or nodules in either right or left breasts. No palpable axillary supraclavicular or infraclavicular adenopathy no breast tenderness or nipple discharge. (exam performed in the presence of a chaperone)  LABORATORY DATA:  I have reviewed the data as listed CMP Latest Ref Rng & Units 06/05/2020 04/21/2020 02/20/2020  Glucose 65 - 99 mg/dL 93 90 121(H)  BUN 8 - 27 mg/dL _0 Creatinine 0.57 - 1.00 mg/dL 0.90 1.00 0.79  Sodium 134 - 144 mmol/L 142 142 140  Potassium 3.5 - 5.2 mmol/L 4.2 3.7 4.3  Chloride 96 - 106 mmol/L 107(H) 106 106  CO2 20 - 29 mmol/L _1 Calcium 8.7 - 10.3 mg/dL 9.3 9.3 9.3  Total Protein 6.0 - 8.5 g/dL 6.6 7.1 -  Total Bilirubin 0.0 - 1.2 mg/dL 0.2 0.4 -  Alkaline Phos 48 - 121 IU/L 63 57 -  AST 0 - 40 IU/L 14 16 -  ALT 0 - 32 IU/L 9 16 -    Lab Results  Component Value Date   WBC 4.7 06/05/2020   HGB 11.4 06/05/2020   HCT 33.9 (L) 06/05/2020   MCV 91 06/05/2020   PLT 234 06/05/2020   NEUTROABS 2.4 06/05/2020    ASSESSMENT & PLAN:  Breast cancer of upper-inner quadrant of left female breast ([place]) Left lumpectomy 04/05/2016: IDC grade 3, 2.3 cm,  associated high-grade DCIS, margins negative, 0/1 lymph node, T2 N0 stage II a, ER 0%, PR 0%, HER-2 negative ratio 1.38 Patient refused adjuvant chemotherapy (patient fully understands that she is at very high risk of recurrence) Received adjuvant radiation from 05/03/2016 to 06/16/2016  Surveillance of breast cancer: 1. Breast exams9/04/2020: Benign 2. Annual mammograms6/06/2020: No abnormalities. Density cat B  Back Pain: seeing Ortho Scan: Disc issues L4-5 and arthritis   Hospitalization: 04/21/2020-04/22/2020: PE: Discharged on Eliquis (had COVID-19 pneumonia January 2021 did not require hospitalization)  Treatment plan:  Lab testing for lupus anticoagulant Anticoagulation for 1 year unless lupus anticoagulant test comes back positive.  I discussed with her that because she has no family history I do not recommend doing  extensive inherited anticoagulation work-ups. MyChart virtual visit in 2 days to review the results of the blood work done today.    No orders of the defined types were placed in this encounter.  The patient has a good understanding of the overall plan. she agrees with it. she will call with any problems that may develop before the next visit here.  Total time spent: 20 mins including face to face time and time spent for planning, charting and coordination of care  Nicholas Lose, MD 06/30/2020  I, Cloyde Reams Dorshimer, am acting as scribe for Dr. Nicholas Lose.  I have reviewed the above documentation for accuracy and completeness, and I agree with the above.

## 2020-06-30 ENCOUNTER — Telehealth: Payer: Self-pay | Admitting: Hematology and Oncology

## 2020-06-30 ENCOUNTER — Other Ambulatory Visit: Payer: Self-pay

## 2020-06-30 ENCOUNTER — Inpatient Hospital Stay: Payer: BC Managed Care – PPO | Attending: Hematology and Oncology | Admitting: Hematology and Oncology

## 2020-06-30 ENCOUNTER — Encounter: Payer: Self-pay | Admitting: Hematology and Oncology

## 2020-06-30 ENCOUNTER — Inpatient Hospital Stay: Payer: BC Managed Care – PPO

## 2020-06-30 DIAGNOSIS — Z8616 Personal history of COVID-19: Secondary | ICD-10-CM | POA: Diagnosis not present

## 2020-06-30 DIAGNOSIS — Z7952 Long term (current) use of systemic steroids: Secondary | ICD-10-CM | POA: Diagnosis not present

## 2020-06-30 DIAGNOSIS — C50212 Malignant neoplasm of upper-inner quadrant of left female breast: Secondary | ICD-10-CM | POA: Insufficient documentation

## 2020-06-30 DIAGNOSIS — Z7901 Long term (current) use of anticoagulants: Secondary | ICD-10-CM | POA: Insufficient documentation

## 2020-06-30 DIAGNOSIS — Z171 Estrogen receptor negative status [ER-]: Secondary | ICD-10-CM

## 2020-06-30 DIAGNOSIS — Z923 Personal history of irradiation: Secondary | ICD-10-CM | POA: Diagnosis not present

## 2020-06-30 DIAGNOSIS — Z79899 Other long term (current) drug therapy: Secondary | ICD-10-CM | POA: Insufficient documentation

## 2020-06-30 DIAGNOSIS — Z9221 Personal history of antineoplastic chemotherapy: Secondary | ICD-10-CM | POA: Insufficient documentation

## 2020-06-30 DIAGNOSIS — M549 Dorsalgia, unspecified: Secondary | ICD-10-CM | POA: Diagnosis not present

## 2020-06-30 DIAGNOSIS — D6862 Lupus anticoagulant syndrome: Secondary | ICD-10-CM | POA: Insufficient documentation

## 2020-06-30 DIAGNOSIS — Z7982 Long term (current) use of aspirin: Secondary | ICD-10-CM | POA: Insufficient documentation

## 2020-06-30 MED ORDER — LEVOCETIRIZINE DIHYDROCHLORIDE 5 MG PO TABS: 5.0000 mg | ORAL_TABLET | Freq: Every evening | ORAL | Status: DC

## 2020-06-30 MED ORDER — HYDROXYZINE HCL 10 MG PO TABS
10.0000 mg | ORAL_TABLET | Freq: Every evening | ORAL | 0 refills | Status: DC | PRN
Start: 2020-06-30 — End: 2020-07-24

## 2020-06-30 NOTE — Telephone Encounter (Signed)
Scheduled appts per 9/7 los. Gave pt a print out of AVS.

## 2020-06-30 NOTE — Assessment & Plan Note (Signed)
Left lumpectomy 04/05/2016: IDC grade 3, 2.3 cm, associated high-grade DCIS, margins negative, 0/1 lymph node, T2 N0 stage II a, ER 0%, PR 0%, HER-2 negative ratio 1.38 Patient refused adjuvant chemotherapy (patient fully understands that she is at very high risk of recurrence) Received adjuvant radiation from 05/03/2016 to 06/16/2016  Surveillance of breast cancer: 1. Breast exams9/04/2020: Benign 2. Annual mammograms6/06/2020: No abnormalities. Density cat B  Back Pain: seeing Ortho Scan: Disc issues L4-5 and arthritis   Hospitalization: 04/21/2020-04/22/2020: PE: Discharged on Eliquis (had COVID-19 pneumonia January 2021 did not require hospitalization) Treatment plan: Anticoagulation for 1 year  Return to clinic in one year for surveillance checkups and follow-up

## 2020-07-01 LAB — BETA-2-GLYCOPROTEIN I ABS, IGG/M/A
Beta-2 Glyco I IgG: <9 GPI IgG units (ref 0–20)
Beta-2-Glycoprotein I IgA: <9 GPI IgA units (ref 0–25)
Beta-2-Glycoprotein I IgM: <9 GPI IgM units (ref 0–32)

## 2020-07-01 LAB — CARDIOLIPIN ANTIBODIES, IGG, IGM, IGA
Anticardiolipin IgA: <9 APL U/mL (ref 0–11)
Anticardiolipin IgG: <9 GPL U/mL (ref 0–14)
Anticardiolipin IgM: <9 MPL U/mL (ref 0–12)

## 2020-07-01 NOTE — Progress Notes (Signed)
HEMATOLOGY-ONCOLOGY TELEPHONE VISIT PROGRESS NOTE  I connected with [name] [name] on 07/02/2020 at  2:30 PM EDT by MyChart video conference and verified that I am speaking with the correct person using two identifiers.  I discussed the limitations, risks, security and privacy concerns of performing an evaluation and management service by MyChart and the availability of in person appointments.  I also discussed with the patient that there may be a patient responsible charge related to this service. The patient expressed understanding and agreed to proceed.  Patient's Location: Home Physician Location: Clinic  CHIEF COMPLIANT: Follow-up of PE on Elqiuis  INTERVAL HISTORY: [name] [name] is a 59 y.o. female with above-mentioned history of triple negative left breast cancer who underwent a lumpectomy, adjuvant chemotherapy, radiation, and is currently on surveillance. She also has a history of PE and is currently on anticoagulation with Eliquis. She presents over MyChart today to discuss her labs.   Oncology History Overview Note      Breast cancer of upper-inner quadrant of left female breast ([place])  02/24/2016 Mammogram   Left breast mass 11:00 position 4 cm from nipple, 2.4 x 1.6 x 1.9 cm, single axillary lymph node noted, T2 N0 stage IIA clinical stage ([place])   02/24/2016 Initial Diagnosis   Moderate to poorly differentiated IDC, lymph node biopsy negative, ER 0%, PR 0%, HER-2, 2+ by IHC, fish negative, ratio 1.2, Ki-67 59%    04/05/2016 Surgery   Left lumpectomy [name]): IDC grade 3, 2.3 cm, associated high-grade DCIS, margins negative, 0/1 lymph node, T2 N0 stage II a, ER 0%, PR 0%, HER-2 negative ratio 1.38    Chemotherapy   Patient refused adjuvant chemotherapy   05/03/2016 - 06/16/2016 Radiation Therapy   Adjuvant radiation [name]). Left breast boost // 10 Gy with 5 fractions at a dose of 2 Gy/fraction.  Left breast// 50.4 Gy with 28 fractions at a dose of 1.8 Gy/ fraction   10/28/2016  Genetic Testing   Patient has genetic testing done for personal and family history of breast cancer. ATM c.6543C>T VUS found on genetic testing.  The Breast/GYN gene panel offered by [facility] includes sequencing and rearrangement analysis for the following 23 genes:  ATM, BARD1, BRCA1, BRCA2, BRIP1, CDH1, CHEK2, EPCAM, FANCC, MLH1, MSH2, MSH6, MUTYH, NBN, NF1, PALB2, PMS2, POLD1, PTEN, RAD51C, RAD51D, RECQL, and TP53.   Negative genetic testing for the MSH2 inversion analysis ([name]).     Observations/Objective:  There were no vitals filed for this visit. There is no height or weight on file to calculate BMI.  I have reviewed the data as listed CMP Latest Ref Rng & Units 06/05/2020 04/21/2020 02/20/2020  Glucose 65 - 99 mg/dL 93 90 121(H)  BUN 8 - 27 mg/dL '9 9 13  ' Creatinine 0.57 - 1.00 mg/dL 0.90 1.00 0.79  Sodium 134 - 144 mmol/L 142 142 140  Potassium 3.5 - 5.2 mmol/L 4.2 3.7 4.3  Chloride 96 - 106 mmol/L 107(H) 106 106  CO2 20 - 29 mmol/L '24 26 21  ' Calcium 8.7 - 10.3 mg/dL 9.3 9.3 9.3  Total Protein 6.0 - 8.5 g/dL 6.6 7.1 -  Total Bilirubin 0.0 - 1.2 mg/dL 0.2 0.4 -  Alkaline Phos 48 - 121 IU/L 63 57 -  AST 0 - 40 IU/L 14 16 -  ALT 0 - 32 IU/L 9 16 -    Lab Results  Component Value Date   WBC 4.7 06/05/2020   HGB 11.4 06/05/2020   HCT 33.9 (L) 06/05/2020  MCV 91 06/05/2020   PLT 234 06/05/2020   NEUTROABS 2.4 06/05/2020      Assessment Plan:  Breast cancer of upper-inner quadrant of left female breast ([place]) Left lumpectomy 04/05/2016: IDC grade 3, 2.3 cm, associated high-grade DCIS, margins negative, 0/1 lymph node, T2 N0 stage II a, ER 0%, PR 0%, HER-2 negative ratio 1.38 Patient refused adjuvant chemotherapy (patient fully understands that she is at very high risk of recurrence) Received adjuvant radiation from 05/03/2016 to 06/16/2016  Surveillance of breast cancer: 1. Breast exams9/04/2020: Benign 2. Annual mammograms6/06/2020: No abnormalities. Density  catB  Back Pain: seeing Ortho Scan: Disc issues L4-5 and arthritis   Hospitalization: 04/21/2020-04/22/2020: PE: Discharged on Eliquis (had COVID-19 pneumonia January 2021 did not require hospitalization)  Treatment plan:  Lupus anticoagulant: - 06/30/2020 Recommendation: Anticoagulation for 1 year completes 04/22/2021  Return to clinic in 1 year for follow-up    I discussed the assessment and treatment plan with the patient. The patient was provided an opportunity to ask questions and all were answered. The patient agreed with the plan and demonstrated an understanding of the instructions. The patient was advised to call back or seek an in-person evaluation if the symptoms worsen or if the condition fails to improve as anticipated.   I provided 12 minutes of face-to-face MyChart video visit time during this encounter.    Rulon Eisenmenger, MD 07/02/2020   I, Molly Dorshimer, am acting as scribe for Nicholas Lose, MD.  I have reviewed the above documentation for accuracy and completeness, and I agree with the above.

## 2020-07-02 ENCOUNTER — Encounter: Payer: Self-pay | Admitting: Hematology and Oncology

## 2020-07-02 ENCOUNTER — Inpatient Hospital Stay (HOSPITAL_BASED_OUTPATIENT_CLINIC_OR_DEPARTMENT_OTHER): Payer: BC Managed Care – PPO | Admitting: Hematology and Oncology

## 2020-07-02 DIAGNOSIS — C50212 Malignant neoplasm of upper-inner quadrant of left female breast: Secondary | ICD-10-CM

## 2020-07-02 DIAGNOSIS — Z171 Estrogen receptor negative status [ER-]: Secondary | ICD-10-CM

## 2020-07-02 LAB — DRVVT CONFIRM: dRVVT Confirm: 1.2 ratio (ref 0.8–1.2)

## 2020-07-02 LAB — LUPUS ANTICOAGULANT PANEL
DRVVT: 54.5 s — ABNORMAL HIGH (ref 0.0–47.0)
PTT Lupus Anticoagulant: 37.7 s (ref 0.0–51.9)

## 2020-07-02 LAB — DRVVT MIX: dRVVT Mix: 43.6 s — ABNORMAL HIGH (ref 0.0–40.4)

## 2020-07-02 NOTE — Assessment & Plan Note (Signed)
Left lumpectomy 04/05/2016: IDC grade 3, 2.3 cm, associated high-grade DCIS, margins negative, 0/1 lymph node, T2 N0 stage II a, ER 0%, PR 0%, HER-2 negative ratio 1.38 Patient refused adjuvant chemotherapy (patient fully understands that she is at very high risk of recurrence) Received adjuvant radiation from 05/03/2016 to 06/16/2016  Surveillance of breast cancer: 1. Breast exams9/04/2020: Benign 2. Annual mammograms6/06/2020: No abnormalities. Density catB  Back Pain: seeing Ortho Scan: Disc issues L4-5 and arthritis   Hospitalization: 04/21/2020-04/22/2020: PE: Discharged on Eliquis (had COVID-19 pneumonia January 2021 did not require hospitalization)  Treatment plan:  Lupus anticoagulant: - 06/30/2020 Recommendation: Anticoagulation for 1 year  Return to clinic in 1 year for follow-up

## 2020-07-03 ENCOUNTER — Other Ambulatory Visit: Payer: Self-pay

## 2020-07-03 ENCOUNTER — Telehealth: Payer: Self-pay | Admitting: Hematology and Oncology

## 2020-07-03 NOTE — Telephone Encounter (Signed)
Scheduled appt per 9/10 los. Left voicemail with appt date and time.

## 2020-07-07 DIAGNOSIS — L821 Other seborrheic keratosis: Secondary | ICD-10-CM | POA: Diagnosis not present

## 2020-07-07 DIAGNOSIS — L501 Idiopathic urticaria: Secondary | ICD-10-CM | POA: Diagnosis not present

## 2020-07-07 DIAGNOSIS — L814 Other melanin hyperpigmentation: Secondary | ICD-10-CM | POA: Diagnosis not present

## 2020-07-13 ENCOUNTER — Encounter: Payer: Self-pay | Admitting: Family Medicine

## 2020-07-23 DIAGNOSIS — I428 Other cardiomyopathies: Secondary | ICD-10-CM | POA: Insufficient documentation

## 2020-07-23 DIAGNOSIS — M461 Sacroiliitis, not elsewhere classified: Secondary | ICD-10-CM | POA: Diagnosis not present

## 2020-07-24 ENCOUNTER — Encounter: Payer: Self-pay | Admitting: Internal Medicine

## 2020-07-24 ENCOUNTER — Ambulatory Visit (INDEPENDENT_AMBULATORY_CARE_PROVIDER_SITE_OTHER): Payer: BC Managed Care – PPO | Admitting: Internal Medicine

## 2020-07-24 ENCOUNTER — Other Ambulatory Visit: Payer: Self-pay

## 2020-07-24 VITALS — BP 104/72 | HR 74 | Ht 68.0 in | Wt 230.6 lb

## 2020-07-24 DIAGNOSIS — I428 Other cardiomyopathies: Secondary | ICD-10-CM | POA: Diagnosis not present

## 2020-07-24 DIAGNOSIS — Z95810 Presence of automatic (implantable) cardiac defibrillator: Secondary | ICD-10-CM

## 2020-07-24 MED ORDER — FUROSEMIDE 20 MG PO TABS: 20.0000 mg | ORAL_TABLET | Freq: Every day | ORAL | 3 refills | Status: DC | PRN

## 2020-07-24 NOTE — Patient Instructions (Signed)
Medication Instructions:   Your physician recommends that you continue on your current medications as directed. Please refer to the Current Medication list given to you today.  *If you need a refill on your cardiac medications before your next appointment, please call your pharmacy*   Lab Work: None ordered.  If you have labs (blood work) drawn today and your tests are completely normal, you will receive your results only by:  [facility] (if you have MyChart) OR  A paper copy in the mail If you have any lab test that is abnormal or we need to change your treatment, we will call you to review the results.   Testing/Procedures: None ordered.    Follow-Up: At [facility], you and your health needs are our priority.  As part of our continuing mission to provide you with exceptional heart care, we have created designated Provider Care Teams.  These Care Teams include your primary Cardiologist (physician) and Advanced Practice Providers (APPs -  Physician Assistants and Nurse Practitioners) who all work together to provide you with the care you need, when you need it.  We recommend signing up for the patient portal called "MyChart".  Sign up information is provided on this After Visit Summary.  MyChart is used to connect with patients for Virtual Visits (Telemedicine).  Patients are able to view lab/test results, encounter notes, upcoming appointments, etc.  Non-urgent messages can be sent to your provider as well.   To learn more about what you can do with MyChart, go to [email or web address].    Your next appointment:   12 month(s)  The format for your next appointment:   In Person  Provider:   Tommye Standard PA-C

## 2020-07-24 NOTE — Progress Notes (Signed)
Patient Care Team: Janora Norlander, DO as PCP - General (Family Medicine) [name], MD as PCP - Electrophysiology (Cardiology) Dorothy Spark, MD as PCP - Cardiology (Cardiology) Nicholas Lose, MD as Consulting Physician (Hematology and Oncology) Kyung Rudd, MD as Consulting Physician (Radiation Oncology) [name], MD as Consulting Physician (General Surgery)   HPI  [name] [name] is a 59 y.o. female Seen in followup for NICM s/p ICD implantation with generator replacement [employer]  3.16  DATE TEST EF   8/14 TTE 30-40 %   1/15 TTE 35-40 %   10/17 Cath 25-30 Normal CA  1/21 Echo  25-30%    She is intercurrently been diagnosed with breast cancer and underwent lumpectomy but declined adjuvant chemotherapy; she did have radiation therapy.   She had been on Entresto.    Intercurrent hosp for PE; on anticoagulation and without bleeding; also with COVID  The patient denies chest pain, nocturnal dyspnea, orthopnea or peripheral edema.  There have been no palpitations, lightheadedness or syncope  Some sob .      Date Cr K Mg Hgb  10/17  0.93 4.0      8/21 0.9 4.2  11.4      Records and Results Reviewed As above   Past Medical History:  Diagnosis Date  . AICD (automatic cardioverter/defibrillator) present 09   [employer]- replaced 2016  . Arthritis   . Bradycardia   . Breast cancer ([place]) 02/24/2016   s/p lumpectomy and radiation but refused chemotherapy  . Chronic systolic CHF (congestive heart failure) ([place])   . Cough due to angiotensin-converting enzyme inhibitor   . Dizziness    Dizziness with standing after squatting, March, 2012  . Dyslipidemia    LDL elevation,Patient does not want statin  . Family history of breast cancer   . GERD (gastroesophageal reflux disease)   . H/O shortness of breath    CPX 10/18:  No evidence of cardiopulmonary limitation. Suspect exercise intolerance related to weight and deconditioning.     [name] Hypothyroidism   . ICD (implantable cardiac defibrillator) battery depletion    Dr. [name], June, 2009(artifactual atrial tachycardia response events addressed by reprogramming in parentheses  . Leg swelling    right leg  . Mitral regurgitation   . Nonischemic cardiomyopathy ([place])    Etiology unknown, diagnosed 2008  . Personal history of radiation therapy 2017  . Pneumonia due to COVID-19 virus 10/2019  . TIA (transient ischemic attack)    No CT or MRI abnormality, aspirin therapy    Past Surgical History:  Procedure Laterality Date  . BREAST BIOPSY Left 02/24/2016   U/S Core  . BREAST BIOPSY Left 02/24/2016   U/S Core  . BREAST LUMPECTOMY Left 04/05/2016   invasive ductal   . BREAST LUMPECTOMY WITH RADIOACTIVE SEED AND SENTINEL LYMPH NODE BIOPSY Left 04/05/2016   Procedure: BREAST LUMPECTOMY WITH RADIOACTIVE SEED AND SENTINEL LYMPH NODE BIOPSY;  Surgeon: [name], MD;  Location: [facility];  Service: General;  Laterality: Left;  . CARDIAC CATHETERIZATION N/A 08/05/2016   Procedure: Left Heart Cath and Coronary Angiography;  Surgeon: Peter M Martinique, MD;  Location: [facility];  Service: Cardiovascular;  Laterality: N/A;  . CARDIAC DEFIBRILLATOR PLACEMENT  09   [employer] no remote  . COLONOSCOPY    . CYST EXCISION Left    back of leg-lt   . IMPLANTABLE CARDIOVERTER DEFIBRILLATOR GENERATOR CHANGE N/A 12/29/2014   Procedure: IMPLANTABLE CARDIOVERTER DEFIBRILLATOR GENERATOR  CHANGE;  Surgeon: [name], MD;  Location: [facility] CATH LAB;  Service: Cardiovascular;  Laterality: N/A;  . LATERAL EPICONDYLE RELEASE  10/11/2012   Procedure: TENNIS ELBOW RELEASE;  Surgeon: [name], MD;  Location: [facility];  Service: Orthopedics;  Laterality: Right;  RIGHT ELBOW: TENOTOMY ELBOW LATERAL EPICONDYLITIS TENNIS ELBOW: RADIAL TUNNEL RELEASE  . PORT-A-CATH REMOVAL  04/13/2016   Procedure: MINOR REMOVAL PORT-A-CATH;  Surgeon: [name], MD;  Location: [facility];  Service: General;;  . PORTACATH PLACEMENT Right 04/05/2016   Procedure: INSERTION PORT-A-CATH WITH [place];  Surgeon: [name], MD;  Location: [facility];  Service: General;  Laterality: Right;  right IJ  . TOTAL THYROIDECTOMY  2001  . TUBAL LIGATION  94  . UPPER GI ENDOSCOPY      Current Outpatient Medications  Medication Sig Dispense Refill  . acetaminophen (TYLENOL) 500 MG tablet Take 500 mg by mouth every 6 (six) hours as needed for mild pain or moderate pain.    [name] albuterol (VENTOLIN HFA) 108 (90 Base) MCG/ACT inhaler Inhale 2 puffs into the lungs every 6 (six) hours as needed for wheezing or shortness of breath. 18 g 0  . apixaban (ELIQUIS) 5 MG TABS tablet Take 1 tablet (5 mg total) by mouth 2 (two) times daily. Start this prescription in about a month after you complete the initial 30-day starter pack dose pack 60 tablet 5  . aspirin EC 81 MG tablet Take 1 tablet (81 mg total) by mouth daily with breakfast. 90 tablet 3  . carvedilol (COREG) 6.25 MG tablet Take 0.5 tablets (3.125 mg total) by mouth 2 (two) times daily. 90 tablet 2  . dicyclomine (BENTYL) 10 MG capsule Take 10 mg by mouth 3 (three) times daily before meals.     . fluticasone (FLONASE) 50 MCG/ACT nasal spray Place 2 sprays into both nostrils daily. 9.9 mL 2  . furosemide (LASIX) 20 MG tablet Take 1 tablet (20 mg total) by mouth daily as needed. 90 tablet 2  . hydrOXYzine (ATARAX/VISTARIL) 25 MG tablet Take 25 mg by mouth at bedtime.    [name] levothyroxine (SYNTHROID) 75 MCG tablet TAKE 1 TABLET BY MOUTH  DAILY 90 tablet 1  . omeprazole (PRILOSEC) 40 MG capsule Take 40 mg by mouth daily.    . sacubitril-valsartan (ENTRESTO) 97-103 MG Take 1 tablet by mouth 2 (two) times daily. 60 tablet 6   No current facility-administered medications for this visit.    Allergies  Allergen Reactions  . Avelox [Moxifloxacin Hcl In Nacl] Shortness Of Breath    Shortness of breath  . Clindamycin/Lincomycin Shortness Of  Breath and Diarrhea    Breathing/diarrhea    . Kiwi Extract Anaphylaxis    Makes pt. Feel like her throat is closing   . Bactrim [Sulfamethoxazole-Trimethoprim] Swelling  . Levofloxacin Other (See Comments)    JOINT PAIN JOINT PAIN  . Simvastatin Other (See Comments)    Joint pain  . Spironolactone Other (See Comments)    burning      Review of Systems negative except from HPI and PMH  Physical Exam BP 104/72   Pulse 74   Ht 5\' 8"  (1.727 m)   Wt 230 lb 9.6 oz (104.6 kg)   SpO2 97%   BMI 35.06 kg/m  Well developed and well nourished in no acute distress HENT normal Neck supple with JVP-flat Clear Device pocket well healed; without hematoma or erythema.  There is no tethering  Regular rate and  rhythm, no  gallop No  murmur Abd-soft with active BS No Clubbing cyanosis  edema Skin-warm and dry A & Oriented  Grossly normal sensory and motor function  ECG    Assessment and  Plan  NICM  CHF chronic systolic  ICD [employer] The patient's device was interrogated.  The information was reviewed. No changes were made in the programming.     Breast Cancer   Nonsustained VT    PE-acute  NICM  But without CHF  Continue current meds  Nonsustained VT present but brief  On anticoagulation without bleeding- Dr Lindi Adie is involved wit the anticoagulation and his input will be very valuable for determining the length of therapy       Current medicines are reviewed at length with the patient today .  The patient does not have concerns regarding medicines. We spent more than 50% of our >25 min visit in face to face counseling regarding the above

## 2020-07-28 ENCOUNTER — Encounter: Payer: Self-pay | Admitting: Family Medicine

## 2020-07-28 DIAGNOSIS — R5383 Other fatigue: Secondary | ICD-10-CM | POA: Diagnosis not present

## 2020-07-28 DIAGNOSIS — R059 Cough, unspecified: Secondary | ICD-10-CM | POA: Diagnosis not present

## 2020-07-28 DIAGNOSIS — R0981 Nasal congestion: Secondary | ICD-10-CM | POA: Diagnosis not present

## 2020-07-31 ENCOUNTER — Other Ambulatory Visit: Payer: Self-pay | Admitting: Family Medicine

## 2020-07-31 ENCOUNTER — Other Ambulatory Visit: Payer: Self-pay | Admitting: Physician Assistant

## 2020-08-03 ENCOUNTER — Other Ambulatory Visit: Payer: Self-pay

## 2020-08-03 ENCOUNTER — Ambulatory Visit (INDEPENDENT_AMBULATORY_CARE_PROVIDER_SITE_OTHER): Payer: BC Managed Care – PPO | Admitting: Family

## 2020-08-03 ENCOUNTER — Encounter: Payer: Self-pay | Admitting: Family Medicine

## 2020-08-03 ENCOUNTER — Encounter: Payer: Self-pay | Admitting: Family

## 2020-08-03 ENCOUNTER — Ambulatory Visit (INDEPENDENT_AMBULATORY_CARE_PROVIDER_SITE_OTHER): Payer: BC Managed Care – PPO

## 2020-08-03 VITALS — BP 112/74 | HR 88 | Temp 97.1°F | Ht 68.0 in | Wt 230.0 lb

## 2020-08-03 DIAGNOSIS — R0602 Shortness of breath: Secondary | ICD-10-CM

## 2020-08-03 DIAGNOSIS — J069 Acute upper respiratory infection, unspecified: Secondary | ICD-10-CM

## 2020-08-03 DIAGNOSIS — R21 Rash and other nonspecific skin eruption: Secondary | ICD-10-CM | POA: Diagnosis not present

## 2020-08-03 DIAGNOSIS — R5383 Other fatigue: Secondary | ICD-10-CM | POA: Diagnosis not present

## 2020-08-03 DIAGNOSIS — R059 Cough, unspecified: Secondary | ICD-10-CM | POA: Diagnosis not present

## 2020-08-03 MED ORDER — DOXYCYCLINE HYCLATE 100 MG PO TABS: 100.0000 mg | ORAL_TABLET | Freq: Two times a day (BID) | ORAL | 0 refills | Status: DC

## 2020-08-03 MED ORDER — ALBUTEROL SULFATE HFA 108 (90 BASE) MCG/ACT IN AERS: 2.0000 | INHALATION_SPRAY | Freq: Four times a day (QID) | RESPIRATORY_TRACT | 2 refills | Status: DC | PRN

## 2020-08-03 NOTE — Progress Notes (Signed)
Subjective:    Patient ID: RAYLEIGH GILLYARD, female    DOB: 09-02-1961, 59 y.o.   MRN: [id number]  Chief Complaint  Patient presents with  . Fatigue    symptoms started over 3 weeks ago   . Shortness of Breath  . Cough    dry cough    PT presents to the office today with cough, SOB, and fatigue. She was seen at the Urgent Care on 07/28/20 and was given Augmentin, prednisone dose pack, cough syrup, and had a negative COVID test. She reports her symptoms have not improved at all and may have worsen since that visit.  Shortness of Breath This is a new problem. The current episode started in the past 7 days. The problem occurs constantly. The problem has been gradually worsening. Associated symptoms include headaches. Pertinent negatives include no chest pain (tightness), ear pain, fever, leg pain, leg swelling, orthopnea, rhinorrhea, sore throat, sputum production, swollen glands, vomiting or wheezing. The treatment provided no relief.  Cough Associated symptoms include headaches and shortness of breath. Pertinent negatives include no chest pain (tightness), ear pain, fever, rhinorrhea, sore throat or wheezing.      Review of Systems  Constitutional: Negative for fever.  HENT: Negative for ear pain, rhinorrhea and sore throat.   Respiratory: Positive for cough and shortness of breath. Negative for sputum production and wheezing.   Cardiovascular: Negative for chest pain (tightness), orthopnea and leg swelling.  Gastrointestinal: Negative for vomiting.  Neurological: Positive for headaches.  All other systems reviewed and are negative.      Objective:   Physical Exam Vitals reviewed.  Constitutional:      General: She is not in acute distress.    Appearance: She is well-developed.  HENT:     Head: Normocephalic and atraumatic.     Right Ear: External ear normal.  Eyes:     Pupils: Pupils are equal, round, and reactive to light.  Neck:     Thyroid: No thyromegaly.    Cardiovascular:     Rate and Rhythm: Normal rate and regular rhythm.     Heart sounds: Normal heart sounds. No murmur heard.   Pulmonary:     Effort: Pulmonary effort is normal. No respiratory distress.     Breath sounds: Examination of the right-upper field reveals decreased breath sounds. Examination of the right-middle field reveals decreased breath sounds. Decreased breath sounds present. No wheezing.  Abdominal:     General: Bowel sounds are normal. There is no distension.     Palpations: Abdomen is soft.     Tenderness: There is no abdominal tenderness.  Musculoskeletal:        General: No tenderness. Normal range of motion.     Cervical back: Normal range of motion and neck supple.  Skin:    General: Skin is warm and dry.  Neurological:     Mental Status: She is alert and oriented to person, place, and time.     Cranial Nerves: No cranial nerve deficit.     Deep Tendon Reflexes: Reflexes are normal and symmetric.  Psychiatric:        Behavior: Behavior normal.        Thought Content: Thought content normal.        Judgment: Judgment normal.       BP 112/74   Pulse 88   Temp (!) 97.1 F (36.2 C) (Temporal)   Ht 5\' 8"  (1.727 m)   Wt 230 lb (104.3 kg)   SpO2 99%  BMI 34.97 kg/m      Assessment & Plan:  ROBERTA ANGELL comes in today with chief complaint of Fatigue (symptoms started over 3 weeks ago ), Shortness of Breath, and Cough (dry cough )   Diagnosis and orders addressed:  1. SOB (shortness of breath) - DG Chest 2 View; Future - doxycycline (VIBRA-TABS) 100 MG tablet; Take 1 tablet (100 mg total) by mouth 2 (two) times daily.  Dispense: 20 tablet; Refill: 0 - albuterol (VENTOLIN HFA) 108 (90 Base) MCG/ACT inhaler; Inhale 2 puffs into the lungs every 6 (six) hours as needed for wheezing or shortness of breath.  Dispense: 8 g; Refill: 2  2. Fatigue, unspecified type - doxycycline (VIBRA-TABS) 100 MG tablet; Take 1 tablet (100 mg total) by mouth 2 (two)  times daily.  Dispense: 20 tablet; Refill: 0 - albuterol (VENTOLIN HFA) 108 (90 Base) MCG/ACT inhaler; Inhale 2 puffs into the lungs every 6 (six) hours as needed for wheezing or shortness of breath.  Dispense: 8 g; Refill: 2  3. URI with cough and congestion - doxycycline (VIBRA-TABS) 100 MG tablet; Take 1 tablet (100 mg total) by mouth 2 (two) times daily.  Dispense: 20 tablet; Refill: 0 - albuterol (VENTOLIN HFA) 108 (90 Base) MCG/ACT inhaler; Inhale 2 puffs into the lungs every 6 (six) hours as needed for wheezing or shortness of breath.  Dispense: 8 g; Refill: 2  Evelina Dun, FNP

## 2020-08-03 NOTE — Patient Instructions (Signed)
Acute Bronchitis, Adult  Acute bronchitis is sudden or acute swelling of the air tubes (bronchi) in the lungs. Acute bronchitis causes these tubes to fill with mucus, which can make it hard to breathe. It can also cause coughing or wheezing. In adults, acute bronchitis usually goes away within 2 weeks. A cough caused by bronchitis may last up to 3 weeks. Smoking, allergies, and asthma can make the condition worse. What are the causes? This condition can be caused by germs and by substances that irritate the lungs, including:  Cold and flu viruses. The most common cause of this condition is the virus that causes the common cold.  Bacteria.  Substances that irritate the lungs, including: ? Smoke from cigarettes and other forms of tobacco. ? Dust and pollen. ? Fumes from chemical products, gases, or burned fuel. ? Other materials that pollute indoor or outdoor air.  Close contact with someone who has acute bronchitis. What increases the risk? The following factors may make you more likely to develop this condition:  A weak body's defense system, also called the immune system.  A condition that affects your lungs and breathing, such as asthma. What are the signs or symptoms? Common symptoms of this condition include:  Lung and breathing problems, such as: ? Coughing. This may bring up clear, yellow, or green mucus from your lungs (sputum). ? Wheezing. ? Having too much mucus in your lungs (chest congestion). ? Having shortness of breath.  A fever.  Chills.  Aches and pains, including: ? Tightness in your chest and other body aches. ? A sore throat. How is this diagnosed? This condition is usually diagnosed based on:  Your symptoms and medical history.  A physical exam. You may also have other tests, including tests to rule out other conditions, such pneumonia. These tests include:  A test of lung function.  Test of a mucus sample to look for the presence of  bacteria.  Tests to check the oxygen level in your blood.  Blood tests.  Chest X-ray. How is this treated? Most cases of acute bronchitis clear up over time without treatment. Your health care provider may recommend:  Drinking more fluids. This can thin your mucus, which may improve your breathing.  Taking a medicine for a fever or cough.  Using a device that gets medicine into your lungs (inhaler) to help improve breathing and control coughing.  Using a vaporizer or a humidifier. These are machines that add water to the air to help you breathe better. Follow these instructions at home: Activity  Get plenty of rest.  Return to your normal activities as told by your health care provider. Ask your health care provider what activities are safe for you. Lifestyle  Drink enough fluid to keep your urine pale yellow.  Do not drink alcohol.  Do not use any products that contain nicotine or tobacco, such as cigarettes, e-cigarettes, and chewing tobacco. If you need help quitting, ask your health care provider. Be aware that: ? Your bronchitis will get worse if you smoke or breathe in other people's smoke (secondhand smoke). ? Your lungs will heal faster if you quit smoking. General instructions   Take over-the-counter and prescription medicines only as told by your health care provider.  Use an inhaler, vaporizer, or humidifier as told by your health care provider.  If you have a sore throat, gargle with a salt-water mixture 3-4 times a day or as needed. To make a salt-water mixture, completely dissolve -1 tsp (3-6   g) of salt in 1 cup (237 mL) of warm water.  Keep all follow-up visits as told by your health care provider. This is important. How is this prevented? To lower your risk of getting this condition again:  Wash your hands often with soap and water. If soap and water are not available, use hand sanitizer.  Avoid contact with people who have cold symptoms.  Try not to  touch your mouth, nose, or eyes with your hands.  Avoid places where there are fumes from chemicals. Breathing these fumes will make your condition worse.  Get the flu shot every year. Contact a health care provider if:  Your symptoms do not improve after 2 weeks of treatment.  You vomit more than once or twice.  You have symptoms of dehydration such as: ? Dark urine. ? Dry skin or eyes. ? Increased thirst. ? Headaches. ? Confusion. ? Muscle cramps. Get help right away if you:  Cough up blood.  Feel pain in your chest.  Have severe shortness of breath.  Faint or keep feeling like you are going to faint.  Have a severe headache.  Have fever or chills that get worse. These symptoms may represent a serious problem that is an emergency. Do not wait to see if the symptoms will go away. Get medical help right away. Call your local emergency services (911 in the [place] Do not drive yourself to the hospital. Summary  Acute bronchitis is sudden (acute) inflammation of the air tubes (bronchi) between the windpipe and the lungs. In adults, acute bronchitis usually goes away within 2 weeks, although coughing may last 3 weeks or longer  Take over-the-counter and prescription medicines only as told by your health care provider.  Drink enough fluid to keep your urine pale yellow.  Contact a health care provider if your symptoms do not improve after 2 weeks of treatment.  Get help right away if you cough up blood, faint, or have chest pain or shortness of breath. This information is not intended to replace advice given to you by your health care provider. Make sure you discuss any questions you have with your health care provider. Document Revised: 06/24/2019 Document Reviewed: 05/03/2019 Elsevier Patient Education  2020 [employer]  

## 2020-08-13 ENCOUNTER — Ambulatory Visit (INDEPENDENT_AMBULATORY_CARE_PROVIDER_SITE_OTHER): Payer: BC Managed Care – PPO

## 2020-08-13 DIAGNOSIS — I428 Other cardiomyopathies: Secondary | ICD-10-CM

## 2020-08-13 LAB — CUP PACEART REMOTE DEVICE CHECK
Battery Remaining Longevity: 84 mo
Battery Remaining Percentage: 96 %
Brady Statistic RA Percent Paced: 1 %
Brady Statistic RV Percent Paced: 1 %
Date Time Interrogation Session: 20211021000200
HighPow Impedance: 58 Ohm
Implantable Lead Implant Date: 20090320
Implantable Lead Implant Date: 20090320
Implantable Lead Location: 753859
Implantable Lead Location: 753860
Implantable Lead Model: 158
Implantable Lead Model: 5076
Implantable Lead Serial Number: 182504
Implantable Pulse Generator Implant Date: 20160307
Lead Channel Impedance Value: 479 Ohm
Lead Channel Impedance Value: 501 Ohm
Lead Channel Pacing Threshold Amplitude: 0.5 V
Lead Channel Pacing Threshold Amplitude: 1 V
Lead Channel Pacing Threshold Pulse Width: 0.4 ms
Lead Channel Pacing Threshold Pulse Width: 0.4 ms
Lead Channel Setting Pacing Amplitude: 2 V
Lead Channel Setting Pacing Amplitude: 2.5 V
Lead Channel Setting Pacing Pulse Width: 0.4 ms
Lead Channel Setting Sensing Sensitivity: 0.6 mV
Pulse Gen Serial Number: 111826

## 2020-08-19 NOTE — Progress Notes (Signed)
Remote ICD transmission.   

## 2020-08-22 ENCOUNTER — Other Ambulatory Visit (HOSPITAL_COMMUNITY): Payer: Self-pay | Admitting: Internal Medicine

## 2020-09-28 ENCOUNTER — Other Ambulatory Visit (HOSPITAL_COMMUNITY): Payer: Self-pay | Admitting: Internal Medicine

## 2020-10-01 ENCOUNTER — Telehealth (HOSPITAL_COMMUNITY): Payer: Self-pay | Admitting: Pharmacist

## 2020-10-01 NOTE — Telephone Encounter (Signed)
Patient Advocate Encounter   Received notification from [facility] that prior authorization for Entresto is required.   PA submitted via fax.  Status is pending   Will continue to follow.  Xanthe Couillard, PharmD, BCPS, BCCP, CPP Heart Failure Clinic Pharmacist [phone]  

## 2020-10-01 NOTE — Telephone Encounter (Signed)
Advanced Heart Failure Patient Advocate Encounter  Prior Authorization for [name] [name] has been approved.    Effective dates: 10/01/20 through 10/01/21  Audry Riles, PharmD, BCPS, BCCP, CPP Heart Failure Clinic Pharmacist [phone]

## 2020-10-05 ENCOUNTER — Ambulatory Visit (INDEPENDENT_AMBULATORY_CARE_PROVIDER_SITE_OTHER): Payer: 59 | Admitting: Family Medicine

## 2020-10-05 ENCOUNTER — Encounter (HOSPITAL_COMMUNITY): Payer: BC Managed Care – PPO | Admitting: Internal Medicine

## 2020-10-05 ENCOUNTER — Other Ambulatory Visit: Payer: Self-pay

## 2020-10-05 VITALS — BP 113/67 | Temp 97.6°F | Wt 232.0 lb

## 2020-10-05 DIAGNOSIS — J301 Allergic rhinitis due to pollen: Secondary | ICD-10-CM

## 2020-10-05 DIAGNOSIS — E890 Postprocedural hypothyroidism: Secondary | ICD-10-CM | POA: Diagnosis not present

## 2020-10-05 DIAGNOSIS — R8290 Unspecified abnormal findings in urine: Secondary | ICD-10-CM

## 2020-10-05 DIAGNOSIS — Z7901 Long term (current) use of anticoagulants: Secondary | ICD-10-CM

## 2020-10-05 DIAGNOSIS — Z7185 Encounter for immunization safety counseling: Secondary | ICD-10-CM

## 2020-10-05 MED ORDER — AZELASTINE HCL 0.1 % NA SOLN: 1.0000 | Freq: Two times a day (BID) | NASAL | 12 refills | Status: DC

## 2020-10-05 NOTE — Progress Notes (Signed)
Subjective: CC: Follow-up hypothyroidism PCP: Janora Norlander, DO [name] [name] is a 59 y.o. female presenting to clinic today for:  1.  Hypothyroidism Patient reports compliance with levothyroxine 75 mcg daily.  No reports of tremor, change in voice, heart palpitations.  2.  Chronic anticoagulation/history of PE, breast cancer Patient continues to follow-up with Dr. Lindi Adie regularly.  She is continued on Eliquis but does not report any concerns for bleeding.  His plan was to keep her on this regimen until June of next year then she may discontinue.  She was checked for lupus anticoagulant but this was negative.  3.  Rhinorrhea Patient with ongoing allergic rhinitis despite use of Flonase, Claritin.  She reports postnasal drip.  4.  Urine odor Patient reports that she has been attempting to drink 60 ounces per day of fluid due to abnormal urine odor.  It was that worse that she never smell but that has since resolved.  She had some external burning as well and she had a GYN checkup of this but this was negative   ROS: Per HPI  Allergies  Allergen Reactions  . Avelox [Moxifloxacin Hcl In Nacl] Shortness Of Breath    Shortness of breath  . Clindamycin/Lincomycin Shortness Of Breath and Diarrhea    Breathing/diarrhea    . Kiwi Extract Anaphylaxis    Makes pt. Feel like her throat is closing   . Bactrim [Sulfamethoxazole-Trimethoprim] Swelling  . Levofloxacin Other (See Comments)    JOINT PAIN JOINT PAIN  . Simvastatin Other (See Comments)    Joint pain  . Spironolactone Other (See Comments)    burning   Past Medical History:  Diagnosis Date  . AICD (automatic cardioverter/defibrillator) present 09   [employer]- replaced 2016  . Arthritis   . Bradycardia   . Breast cancer ([place]) 02/24/2016   s/p lumpectomy and radiation but refused chemotherapy  . Chronic systolic CHF (congestive heart failure) ([place])   . Cough due to angiotensin-converting enzyme  inhibitor   . Dizziness    Dizziness with standing after squatting, March, 2012  . Dyslipidemia    LDL elevation,Patient does not want statin  . Family history of breast cancer   . GERD (gastroesophageal reflux disease)   . H/O shortness of breath    CPX 10/18:  No evidence of cardiopulmonary limitation. Suspect exercise intolerance related to weight and deconditioning.   [name] Hypothyroidism   . ICD (implantable cardiac defibrillator) battery depletion    Dr. [name], June, 2009(artifactual atrial tachycardia response events addressed by reprogramming in parentheses  . Leg swelling    right leg  . Mitral regurgitation   . Nonischemic cardiomyopathy ([place])    Etiology unknown, diagnosed 2008  . Personal history of radiation therapy 2017  . Pneumonia due to COVID-19 virus 10/2019  . TIA (transient ischemic attack)    No CT or MRI abnormality, aspirin therapy    Current Outpatient Medications:  .  acetaminophen (TYLENOL) 500 MG tablet, Take 500 mg by mouth every 6 (six) hours as needed for mild pain or moderate pain., Disp: , Rfl:  .  albuterol (VENTOLIN HFA) 108 (90 Base) MCG/ACT inhaler, Inhale 2 puffs into the lungs every 6 (six) hours as needed for wheezing or shortness of breath., Disp: 8 g, Rfl: 2 .  apixaban (ELIQUIS) 5 MG TABS tablet, Take 1 tablet (5 mg total) by mouth 2 (two) times daily. Start this prescription in about a month after you complete the initial 30-day starter pack dose  pack, Disp: 60 tablet, Rfl: 5 .  aspirin EC 81 MG tablet, Take 1 tablet (81 mg total) by mouth daily with breakfast., Disp: 90 tablet, Rfl: 3 .  carvedilol (COREG) 6.25 MG tablet, Take 0.5 tablets (3.125 mg total) by mouth 2 (two) times daily., Disp: 90 tablet, Rfl: 2 .  dicyclomine (BENTYL) 10 MG capsule, Take 10 mg by mouth 3 (three) times daily before meals. , Disp: , Rfl:  .  doxycycline (VIBRA-TABS) 100 MG tablet, Take 1 tablet (100 mg total) by mouth 2 (two) times daily., Disp: 20 tablet, Rfl: 0 .   ENTRESTO 97-103 MG, Take 1 tablet by mouth twice daily, Disp: 60 tablet, Rfl: 0 .  fluticasone (FLONASE) 50 MCG/ACT nasal spray, Place 2 sprays into both nostrils daily., Disp: 9.9 mL, Rfl: 2 .  furosemide (LASIX) 20 MG tablet, Take 1 tablet by mouth once daily, Disp: 30 tablet, Rfl: 3 .  hydrOXYzine (ATARAX/VISTARIL) 25 MG tablet, Take 25 mg by mouth at bedtime., Disp: , Rfl:  .  levothyroxine (EUTHYROX) 75 MCG tablet, Take 1 tablet (75 mcg total) by mouth daily before breakfast., Disp: 90 tablet, Rfl: 2 .  omeprazole (PRILOSEC) 40 MG capsule, Take 40 mg by mouth daily., Disp: , Rfl:  Social History   Socioeconomic History  . Marital status: Widowed    Spouse name: Not on file  . Number of children: Not on file  . Years of education: Not on file  . Highest education level: Not on file  Occupational History  . Occupation: Widow  Tobacco Use  . Smoking status: Never Smoker  . Smokeless tobacco: Never Used  Vaping Use  . Vaping Use: Never used  Substance and Sexual Activity  . Alcohol use: No  . Drug use: No  . Sexual activity: Not on file  Other Topics Concern  . Not on file  Social History Narrative  . Not on file   Social Determinants of Health   Financial Resource Strain: Medium Risk  . Difficulty of Paying Living Expenses: Somewhat hard  Food Insecurity: Not on file  Transportation Needs: No Transportation Needs  . Lack of Transportation (Medical): No  . Lack of Transportation (Non-Medical): No  Physical Activity: Insufficiently Active  . Days of Exercise per Week: 1 day  . Minutes of Exercise per Session: 10 min  Stress: Not on file  Social Connections: Not on file  Intimate Partner Violence: Not on file   Family History  Problem Relation Age of Onset  . Heart attack Mother   . Cancer Mother        breast  . Breast cancer Mother 58  . Hypertension Sister   . Hypertension Brother   . Cancer Maternal Aunt        breast  . Breast cancer Maternal Aunt   .  Cancer Paternal Grandmother        possible cancer, unknown type  . Stroke Neg Hx   . Diabetes Neg Hx     Objective: Office vital signs reviewed. BP 113/67   Temp 97.6 F (36.4 C)   Wt 232 lb (105.2 kg)   BMI 35.28 kg/m   Physical Examination:  General: Awake, alert, well nourished, No acute distress HEENT: No exophthalmos Cardio: regular rate and rhythm, S1S2 heard, soft systolic murmur appreciated Pulm: clear to auscultation bilaterally, no wheezes, rhonchi or rales; normal work of breathing on room air Extremities: warm, well perfused, No edema, cyanosis or clubbing; +2 pulses bilaterally  Assessment/ Plan: 59  y.o. female   Post-surgical hypothyroidism - Plan: Thyroid Panel With TSH  Seasonal allergic rhinitis due to pollen  Anticoagulated  Abnormal urine odor  Vaccine counseling  Asymptomatic.  Check thyroid panel.  Of added Astelin nasal spray for her rhinorrhea.  Okay to continue Claritin but may need to consider adding Xyzal at bedtime.  I would like to see her back in 6 months, at which point we will discontinue her Eliquis as per recommendations by Dr. Lindi Adie, who she will see next September.  Advised to get Covid booster.  I think while she is on anticoagulation she will be at the lowest risk she could possibly be for coagulopathy  She will let me know if the urinary odor returns at which point we should consider getting a urinalysis to further evaluate.  Encouraged to hydrate as able.  Follow-up as needed on this issue  No orders of the defined types were placed in this encounter.  No orders of the defined types were placed in this encounter.    Janora Norlander, DO [place] [phone]

## 2020-10-06 LAB — THYROID PANEL WITH TSH
Free Thyroxine Index: 2.8 (ref 1.2–4.9)
T3 Uptake Ratio: 26 % (ref 24–39)
T4, Total: 10.8 ug/dL (ref 4.5–12.0)
TSH: 1.37 u[IU]/mL (ref 0.450–4.500)

## 2020-10-14 ENCOUNTER — Other Ambulatory Visit: Payer: Self-pay

## 2020-10-14 ENCOUNTER — Ambulatory Visit (HOSPITAL_COMMUNITY)
Admission: RE | Admit: 2020-10-14 | Discharge: 2020-10-14 | Disposition: A | Discharge status: Home / Self Care | Payer: 59 | Source: Ambulatory Visit | Attending: Internal Medicine | Admitting: Internal Medicine

## 2020-10-14 ENCOUNTER — Encounter (HOSPITAL_COMMUNITY): Payer: Self-pay | Admitting: Internal Medicine

## 2020-10-14 VITALS — BP 120/80 | HR 70 | Wt 231.0 lb

## 2020-10-14 DIAGNOSIS — I110 Hypertensive heart disease with heart failure: Secondary | ICD-10-CM | POA: Diagnosis not present

## 2020-10-14 DIAGNOSIS — I5022 Chronic systolic (congestive) heart failure: Secondary | ICD-10-CM | POA: Insufficient documentation

## 2020-10-14 DIAGNOSIS — G4719 Other hypersomnia: Secondary | ICD-10-CM

## 2020-10-14 DIAGNOSIS — E785 Hyperlipidemia, unspecified: Secondary | ICD-10-CM | POA: Insufficient documentation

## 2020-10-14 DIAGNOSIS — Z7901 Long term (current) use of anticoagulants: Secondary | ICD-10-CM | POA: Insufficient documentation

## 2020-10-14 DIAGNOSIS — Z79899 Other long term (current) drug therapy: Secondary | ICD-10-CM | POA: Diagnosis not present

## 2020-10-14 DIAGNOSIS — Z923 Personal history of irradiation: Secondary | ICD-10-CM | POA: Diagnosis not present

## 2020-10-14 DIAGNOSIS — R0683 Snoring: Secondary | ICD-10-CM

## 2020-10-14 DIAGNOSIS — I428 Other cardiomyopathies: Secondary | ICD-10-CM | POA: Insufficient documentation

## 2020-10-14 DIAGNOSIS — I10 Essential (primary) hypertension: Secondary | ICD-10-CM

## 2020-10-14 DIAGNOSIS — Z8249 Family history of ischemic heart disease and other diseases of the circulatory system: Secondary | ICD-10-CM | POA: Insufficient documentation

## 2020-10-14 DIAGNOSIS — Z853 Personal history of malignant neoplasm of breast: Secondary | ICD-10-CM | POA: Insufficient documentation

## 2020-10-14 DIAGNOSIS — Z95810 Presence of automatic (implantable) cardiac defibrillator: Secondary | ICD-10-CM | POA: Insufficient documentation

## 2020-10-14 LAB — CBC
HCT: 34.2 % — ABNORMAL LOW (ref 36.0–46.0)
Hemoglobin: 11.1 g/dL — ABNORMAL LOW (ref 12.0–15.0)
MCH: 30.6 pg (ref 26.0–34.0)
MCHC: 32.5 g/dL (ref 30.0–36.0)
MCV: 94.2 fL (ref 80.0–100.0)
Platelets: 235 10*3/uL (ref 150–400)
RBC: 3.63 MIL/uL — ABNORMAL LOW (ref 3.87–5.11)
RDW: 13.3 % (ref 11.5–15.5)
WBC: 5.3 10*3/uL (ref 4.0–10.5)
nRBC: 0 % (ref 0.0–0.2)

## 2020-10-14 LAB — BASIC METABOLIC PANEL
Anion gap: 9 (ref 5–15)
BUN: 9 mg/dL (ref 6–20)
CO2: 26 mmol/L (ref 22–32)
Calcium: 9.1 mg/dL (ref 8.9–10.3)
Chloride: 107 mmol/L (ref 98–111)
Creatinine, Ser: 0.91 mg/dL (ref 0.44–1.00)
GFR, Estimated: >60 mL/min (ref >60)
Glucose, Bld: 88 mg/dL (ref 70–99)
Potassium: 4 mmol/L (ref 3.5–5.1)
Sodium: 142 mmol/L (ref 135–145)

## 2020-10-14 NOTE — Patient Instructions (Addendum)
Labs done today, your results will be available in MyChart, we will contact you for abnormal readings.  Your physician has recommended that you have a sleep study. This test records several body functions during sleep, including: brain activity, eye movement, oxygen and carbon dioxide blood levels, heart rate and rhythm, breathing rate and rhythm, the flow of air through your mouth and nose, snoring, body muscle movements, and chest and belly movement. Once approved by insurance we will call you.     Your physician recommends that you schedule a follow-up appointment in: 4 months with an echocardiogram   If you have any questions or concerns before your next appointment please send [place] a message through [place] or call our office at [phone].    TO LEAVE A MESSAGE FOR THE NURSE SELECT OPTION 2, PLEASE LEAVE A MESSAGE INCLUDING: . YOUR NAME . DATE OF BIRTH . CALL BACK NUMBER . REASON FOR CALL**this is important as we prioritize the call backs  [place] AS LONG AS YOU CALL BEFORE 4:00 PM  At the [facility], you and your health needs are our priority. As part of our continuing mission to provide you with exceptional heart care, we have created designated Provider Care Teams. These Care Teams include your primary Cardiologist (physician) and Advanced Practice Providers (APPs- Physician Assistants and Nurse Practitioners) who all work together to provide you with the care you need, when you need it.   You may see any of the following providers on your designated Care Team at your next follow up: [name] Dr Glori Bickers . Dr Loralie Champagne . Darrick Grinder, NP . Lyda Jester, PA . Audry Riles, PharmD   Please be sure to bring in all your medications bottles to every appointment.

## 2020-10-14 NOTE — Progress Notes (Signed)
ADVANCED HF CLINIC NOTE  Referring Physician: Dr. [name] Primary Cardiologist: Dr. [name] PCP: Dr. Lajuana Ripple  HPI:  [name] [name] is a 59 y.o.female with breast cancer (s/p lumpectomy and XRT - refused chemo), hypothyroidism referred by Dr. [name] for further evaluation of systolic HF due to NICM. EF ~30%   She has long h/o NICM dating back to 2007. Cath in 2017 with normal cors and EF 25-30%. S/p s-ICD. Medical therapy has been limited by multiple medication intolerances (Spironolactone caused body burning sensation, could not afford Bidil and had fatigue with high does of Entresto).   Recently had COVID PNA in 1/21. Was treated at home. Her mother passed from [place].   Last echo 1/21 EF 25-30% RV normal   I saw her for first visit in 2/21. Was feeling well. Entresto increased to 49/51 bid  We got CPX which showed normal functional capacity.   CPX 12/19/19 FVC 2.75 (87%)    FEV1 2.14 (86%)     FEV1/FVC 78 (98%)     MVV 105 (104%)   Resting HR: 85 Standing HR: 86 Peak HR: 161  (100% age predicted max HR)  BP rest: 128/84 Standing BP: 116/82 BP peak: 166/72  Peak VO2: 17.7 (97%% predicted peak VO2)  - adjusted for ibw peak VO2 is 24.7 ml/kg (ibw)/min VE/VCO2 slope: 29  Peak RER: 1.08  O2pulse: 11  (100% predicted O2pulse)   Seen in clinic on 6/21 for f/u. Doing well from a HF perspective. Only real complaint is dry hacking cough. No edema, orthopnea or PND. Struggling with back pain. Walking up to 25 mins/day now. No SOB.    Here today for follow up. Feeling OK. Still has dry hacking cough worse at night. SOB with carrying groceries. Not as active as she used to be due to lower back pain. No CP, dizziness, palpitations, edema, or PND/orthopnea. No bleeding issues, per Dr. Lindi Adie, will stop Eliquis in June of next year.   ICD interrogated personally in clinic: No VT/AF Activity level 0.6hr/day 35 episodes of NSVT Personally reviewed   Past Medical History:  Diagnosis  Date  . AICD (automatic cardioverter/defibrillator) present 09   [employer]- replaced 2016  . Arthritis   . Bradycardia   . Breast cancer ([place]) 02/24/2016   s/p lumpectomy and radiation but refused chemotherapy  . Chronic systolic CHF (congestive heart failure) ([place])   . Cough due to angiotensin-converting enzyme inhibitor   . Dizziness    Dizziness with standing after squatting, March, 2012  . Dyslipidemia    LDL elevation,Patient does not want statin  . Family history of breast cancer   . GERD (gastroesophageal reflux disease)   . H/O shortness of breath    CPX 10/18:  No evidence of cardiopulmonary limitation. Suspect exercise intolerance related to weight and deconditioning.   [name] Hypothyroidism   . ICD (implantable cardiac defibrillator) battery depletion    Dr. [name], June, 2009(artifactual atrial tachycardia response events addressed by reprogramming in parentheses  . Leg swelling    right leg  . Mitral regurgitation   . Nonischemic cardiomyopathy ([place])    Etiology unknown, diagnosed 2008  . Personal history of radiation therapy 2017  . Pneumonia due to COVID-19 virus 10/2019  . TIA (transient ischemic attack)    No CT or MRI abnormality, aspirin therapy    Current Outpatient Medications  Medication Sig Dispense Refill  . acetaminophen (TYLENOL) 500 MG tablet Take 500 mg by mouth every 6 (six) hours as needed for mild pain  or moderate pain.    [name] albuterol (VENTOLIN HFA) 108 (90 Base) MCG/ACT inhaler Inhale 2 puffs into the lungs every 6 (six) hours as needed for wheezing or shortness of breath. 8 g 2  . apixaban (ELIQUIS) 5 MG TABS tablet Take 1 tablet (5 mg total) by mouth 2 (two) times daily. Start this prescription in about a month after you complete the initial 30-day starter pack dose pack 60 tablet 5  . azelastine (ASTELIN) 0.1 % nasal spray Place 1 spray into both nostrils 2 (two) times daily. 30 mL 12  . carvedilol (COREG) 6.25 MG tablet Take 0.5 tablets  (3.125 mg total) by mouth 2 (two) times daily. 90 tablet 2  . dicyclomine (BENTYL) 10 MG capsule Take 10 mg by mouth 3 (three) times daily before meals.     [name] ENTRESTO 97-103 MG Take 1 tablet by mouth twice daily 60 tablet 0  . levothyroxine (EUTHYROX) 75 MCG tablet Take 1 tablet (75 mcg total) by mouth daily before breakfast. 90 tablet 2  . omeprazole (PRILOSEC) 40 MG capsule Take 40 mg by mouth daily.    . furosemide (LASIX) 20 MG tablet Take 1 tablet by mouth once daily (Patient not taking: Reported on 10/14/2020) 30 tablet 3   No current facility-administered medications for this encounter.    Allergies  Allergen Reactions  . Avelox [Moxifloxacin Hcl In Nacl] Shortness Of Breath    Shortness of breath  . Clindamycin/Lincomycin Shortness Of Breath and Diarrhea    Breathing/diarrhea    . Kiwi Extract Anaphylaxis    Makes pt. Feel like her throat is closing   . Bactrim [Sulfamethoxazole-Trimethoprim] Swelling  . Levofloxacin Other (See Comments)    JOINT PAIN JOINT PAIN  . Simvastatin Other (See Comments)    Joint pain  . Spironolactone Other (See Comments)    burning      Social History   Socioeconomic History  . Marital status: Widowed    Spouse name: Not on file  . Number of children: Not on file  . Years of education: Not on file  . Highest education level: Not on file  Occupational History  . Occupation: Widow  Tobacco Use  . Smoking status: Never Smoker  . Smokeless tobacco: Never Used  Vaping Use  . Vaping Use: Never used  Substance and Sexual Activity  . Alcohol use: No  . Drug use: No  . Sexual activity: Not on file  Other Topics Concern  . Not on file  Social History Narrative  . Not on file   Social Determinants of Health   Financial Resource Strain: Medium Risk  . Difficulty of Paying Living Expenses: Somewhat hard  Food Insecurity: Not on file  Transportation Needs: No Transportation Needs  . Lack of Transportation (Medical): No  . Lack of  Transportation (Non-Medical): No  Physical Activity: Insufficiently Active  . Days of Exercise per Week: 1 day  . Minutes of Exercise per Session: 10 min  Stress: Not on file  Social Connections: Not on file  Intimate Partner Violence: Not on file     Family History  Problem Relation Age of Onset  . Heart attack Mother   . Cancer Mother        breast  . Breast cancer Mother 103  . Hypertension Sister   . Hypertension Brother   . Cancer Maternal Aunt        breast  . Breast cancer Maternal Aunt   . Cancer Paternal Grandmother  possible cancer, unknown type  . Stroke Neg Hx   . Diabetes Neg Hx     Vitals:   10/14/20 1318 10/14/20 1403  BP: 140/90 120/80  Pulse: 70   SpO2: 99%   Weight: 104.8 kg (231 lb)      Wt Readings from Last 3 Encounters:  10/14/20 104.8 kg (231 lb)  10/05/20 105.2 kg (232 lb)  08/03/20 104.3 kg (230 lb)   PHYSICAL EXAM: General:  Well appearing. No resp difficulty HEENT: normal Neck: supple. no JVD. Carotids 2+ bilat; no bruits. No lymphadenopathy or thryomegaly appreciated. Cor: PMI nondisplaced. Regular rate & rhythm. No rubs, gallops or murmurs. Lungs: clear Abdomen: soft, nontender, nondistended. No hepatosplenomegaly. No bruits or masses. Good bowel sounds. Extremities: no cyanosis, clubbing, rash, edema Neuro: alert & orientedx3, cranial nerves grossly intact. moves all 4 extremities w/o difficulty. Affect pleasant  ECG 10/14/20:  Personally reviewed   ASSESSMENT & PLAN:  1. Chronic systolic HF - longstanding NICM dating back to 2007. Unclear etiology - Cath in 2017 with normal cors and EF 25-30%. S/p s-ICD - Echo 11/20 read as 35-40% (likely 30-35%)  - Echo 1/21 EF 25-30%  RV normal  (post-COVID) - CPX testing 2/21 with normal functional capacity when corrected for ibw. No obvious HF limitation - Improved NYHA II. Volume status good. Off lasix with Entresto.  - GDMT limited by multiple medication intolerances  (Spironolactone caused body burning sensation, could not afford Bidil  - Continue Entresto to 97/103 bid. - Continue carvedilol 3.125 bid. - We discussed Wilder Glade but would like to avoid given her propensity for yeast infections.  - See back in 4-6 months with repeat echo  2. HTN - Blood pressure well controlled. Continue current regimen.  3. Snoring - Pending sleep study. Will reorder.  F/u in Clinic in 6 months with echo   Glori Bickers, MD  2:52 PM

## 2020-10-21 ENCOUNTER — Encounter: Payer: Self-pay | Admitting: Family Medicine

## 2020-10-28 ENCOUNTER — Other Ambulatory Visit (HOSPITAL_COMMUNITY): Payer: Self-pay | Admitting: Internal Medicine

## 2020-11-04 ENCOUNTER — Other Ambulatory Visit: Payer: Self-pay

## 2020-11-04 ENCOUNTER — Encounter: Payer: Self-pay | Admitting: Family Medicine

## 2020-11-04 ENCOUNTER — Ambulatory Visit (INDEPENDENT_AMBULATORY_CARE_PROVIDER_SITE_OTHER): Payer: 59 | Admitting: Family Medicine

## 2020-11-04 VITALS — BP 109/70 | HR 79 | Temp 97.7°F | Ht 68.0 in | Wt 230.0 lb

## 2020-11-04 DIAGNOSIS — J3489 Other specified disorders of nose and nasal sinuses: Secondary | ICD-10-CM | POA: Diagnosis not present

## 2020-11-04 DIAGNOSIS — D485 Neoplasm of uncertain behavior of skin: Secondary | ICD-10-CM

## 2020-11-04 DIAGNOSIS — J301 Allergic rhinitis due to pollen: Secondary | ICD-10-CM | POA: Diagnosis not present

## 2020-11-04 DIAGNOSIS — L989 Disorder of the skin and subcutaneous tissue, unspecified: Secondary | ICD-10-CM

## 2020-11-04 MED ORDER — MONTELUKAST SODIUM 10 MG PO TABS: 10.0000 mg | ORAL_TABLET | Freq: Every day | ORAL | 3 refills | Status: DC

## 2020-11-04 NOTE — Progress Notes (Signed)
Subjective: CC: Allergic rhinitis PCP: Janora Norlander, DO YX:[id number] [name] [name] is a 60 y.o. female presenting to clinic today for:  1.  Allergic rhinitis Patient with ongoing sinus congestion, left-sided ear fullness and what seems to be excessive mucus production that is most prominent each morning when she gets up.  The symptoms are refractory to Astelin, Flonase and Claritin.  No known exposures.  She has not had any fevers or other concerning symptoms  2. skin lesion Patient reports a skin lesion on the left hand.  She has had multiple spots on her hands but this lesion over the last year seems to be more scaly.  She has not seen her dermatologist in [place] since the development of the lesion.  She wonders if it is a wart   ROS: Per HPI  Allergies  Allergen Reactions  . Avelox [Moxifloxacin Hcl In Nacl] Shortness Of Breath    Shortness of breath  . Clindamycin/Lincomycin Shortness Of Breath and Diarrhea    Breathing/diarrhea    . Kiwi Extract Anaphylaxis    Makes pt. Feel like her throat is closing   . Bactrim [Sulfamethoxazole-Trimethoprim] Swelling  . Levofloxacin Other (See Comments)    JOINT PAIN JOINT PAIN  . Simvastatin Other (See Comments)    Joint pain  . Spironolactone Other (See Comments)    burning   Past Medical History:  Diagnosis Date  . AICD (automatic cardioverter/defibrillator) present 09   [employer]- replaced 2016  . Arthritis   . Bradycardia   . Breast cancer ([place]) 02/24/2016   s/p lumpectomy and radiation but refused chemotherapy  . Chronic systolic CHF (congestive heart failure) ([place])   . Cough due to angiotensin-converting enzyme inhibitor   . Dizziness    Dizziness with standing after squatting, March, 2012  . Dyslipidemia    LDL elevation,Patient does not want statin  . Family history of breast cancer   . GERD (gastroesophageal reflux disease)   . H/O shortness of breath    CPX 10/18:  No evidence of cardiopulmonary  limitation. Suspect exercise intolerance related to weight and deconditioning.   [name] Hypothyroidism   . ICD (implantable cardiac defibrillator) battery depletion    Dr. [name], June, 2009(artifactual atrial tachycardia response events addressed by reprogramming in parentheses  . Leg swelling    right leg  . Mitral regurgitation   . Nonischemic cardiomyopathy ([place])    Etiology unknown, diagnosed 2008  . Personal history of radiation therapy 2017  . Pneumonia due to COVID-19 virus 10/2019  . TIA (transient ischemic attack)    No CT or MRI abnormality, aspirin therapy    Current Outpatient Medications:  .  acetaminophen (TYLENOL) 500 MG tablet, Take 500 mg by mouth every 6 (six) hours as needed for mild pain or moderate pain., Disp: , Rfl:  .  albuterol (VENTOLIN HFA) 108 (90 Base) MCG/ACT inhaler, Inhale 2 puffs into the lungs every 6 (six) hours as needed for wheezing or shortness of breath., Disp: 8 g, Rfl: 2 .  apixaban (ELIQUIS) 5 MG TABS tablet, Take 1 tablet (5 mg total) by mouth 2 (two) times daily. Start this prescription in about a month after you complete the initial 30-day starter pack dose pack, Disp: 60 tablet, Rfl: 5 .  azelastine (ASTELIN) 0.1 % nasal spray, Place 1 spray into both nostrils 2 (two) times daily., Disp: 30 mL, Rfl: 12 .  carvedilol (COREG) 6.25 MG tablet, Take 0.5 tablets (3.125 mg total) by mouth 2 (two) times daily.,  Disp: 90 tablet, Rfl: 2 .  dicyclomine (BENTYL) 10 MG capsule, Take 10 mg by mouth 3 (three) times daily before meals. , Disp: , Rfl:  .  ENTRESTO 97-103 MG, Take 1 tablet by mouth twice daily, Disp: 60 tablet, Rfl: 0 .  furosemide (LASIX) 20 MG tablet, Take 1 tablet by mouth once daily (Patient not taking: Reported on 10/14/2020), Disp: 30 tablet, Rfl: 3 .  levothyroxine (EUTHYROX) 75 MCG tablet, Take 1 tablet (75 mcg total) by mouth daily before breakfast., Disp: 90 tablet, Rfl: 2 .  omeprazole (PRILOSEC) 40 MG capsule, Take 40 mg by mouth daily.,  Disp: , Rfl:  Social History   Socioeconomic History  . Marital status: Widowed    Spouse name: Not on file  . Number of children: Not on file  . Years of education: Not on file  . Highest education level: Not on file  Occupational History  . Occupation: Widow  Tobacco Use  . Smoking status: Never Smoker  . Smokeless tobacco: Never Used  Vaping Use  . Vaping Use: Never used  Substance and Sexual Activity  . Alcohol use: No  . Drug use: No  . Sexual activity: Not on file  Other Topics Concern  . Not on file  Social History Narrative  . Not on file   Social Determinants of Health   Financial Resource Strain: Medium Risk  . Difficulty of Paying Living Expenses: Somewhat hard  Food Insecurity: Not on file  Transportation Needs: No Transportation Needs  . Lack of Transportation (Medical): No  . Lack of Transportation (Non-Medical): No  Physical Activity: Insufficiently Active  . Days of Exercise per Week: 1 day  . Minutes of Exercise per Session: 10 min  Stress: Not on file  Social Connections: Not on file  Intimate Partner Violence: Not on file   Family History  Problem Relation Age of Onset  . Heart attack Mother   . Cancer Mother        breast  . Breast cancer Mother 57  . Hypertension Sister   . Hypertension Brother   . Cancer Maternal Aunt        breast  . Breast cancer Maternal Aunt   . Cancer Paternal Grandmother        possible cancer, unknown type  . Stroke Neg Hx   . Diabetes Neg Hx     Objective: Office vital signs reviewed. BP 109/70   Pulse 79   Temp 97.7 F (36.5 C) (Temporal)   Ht 5\' 8"  (1.727 m)   Wt 230 lb (104.3 kg)   SpO2 94%   BMI 34.97 kg/m   Physical Examination:  General: Awake, alert, well nourished, No acute distress HEENT: Normal; TMs intact bilaterally.  Scant cerumen noted on the right external auditory canal.  Bilateral nares with some narrowing.  Left base of nare appears to have a polyp.  No active drainage.  No  tenderness to palpation to sinuses Skin: Rough, scaly, well-rounded lesion noted along the lateral aspect of the left hand just beneath the base of the thumb.  It is approximately 2 mm in radius.  Cryotherapy Procedure:  Risks and benefits of procedure were reviewed with the patient.  Written consent obtained and scanned into the chart.  Lesion of concern was identified and located on left hand.  Liquid nitrogen was applied to area of concern and extending out 1.5 millimeters beyond the border of the lesion.  Treated area was allowed to come back to room  temperature before treating it a second time.  Patient tolerated procedure well and there were no immediate complications.  Home care instructions were reviewed with the patient and a handout was provided.  Assessment/ Plan: 60 y.o. female   Seasonal allergic rhinitis due to pollen - Plan: montelukast (SINGULAIR) 10 MG tablet  Rhinorrhea  Skin lesion of hand  Ongoing rhinorrhea sinus congestion despite Claritin, Astelin and Flonase.  I have asked her to switch Flonase to Nasonex, I have added Singulair and she may continue Claritin.  If symptoms persist, we will plan to refer to Dr. Constance Holster in [place].  She does have what appears to be a polyp at the base of the left nare.  Believes in her hand had features of both actinic keratosis and seborrheic keratosis and therefore was treated with cryotherapy.  She tolerated this procedure without difficulty.  Home care instructions were reviewed with the patient and reasons for return discussed.  Follow-up as needed  No orders of the defined types were placed in this encounter.  No orders of the defined types were placed in this encounter.    Janora Norlander, DO [place] [phone]

## 2020-11-04 NOTE — Patient Instructions (Signed)
Use Claritin during day, Singulair at night and Nasonex daily.  If no improvement after 1 month, message me and I will refer [facility]  Cryoablation, Care After This sheet gives you information about how to care for yourself after your procedure. Your health care provider may also give you more specific instructions. If you have problems or questions, contact your health care provider. What can I expect after the procedure? After the procedure, it is common to have:  Soreness around the treatment area.  Mild pain and swelling in the treatment area. Follow these instructions at home: Treatment area care  If you have an incision, follow instructions from your health care provider about how to take care of it. Make sure you: ? Wash your hands with soap and water for at least 20 seconds before and after you change your bandage (dressing). If soap and water are not available, use hand sanitizer. ? Change your dressing as told by your health care provider. ? Leave stitches (sutures), skin glue, or adhesive strips in place. These skin closures may need to stay in place for 2 weeks or longer. If adhesive strip edges start to loosen and curl up, you may trim the loose edges. Do not remove adhesive strips completely unless your health care provider tells you to do that.  Check your treatment area every day for signs of infection. Check for: ? More redness, swelling, or pain. ? Fluid or blood. ? Warmth. ? Pus or a bad smell.  Keep the treated area clean, dry, and covered with a dressing until it has healed. Clean the area with soap and water or as told by your health care provider. If your bandage gets wet, change it right away.  Do not take baths, swim, or use a hot tub until your health care provider approves. Ask your health care provider if you may take showers. You may only be allowed to take sponge baths.   Activity  Follow instructions from your health care provider about  any activity limitations, including lifting heavy objects.  If you were given a sedative during the procedure, it can affect you for several hours. Do not drive or operate machinery until your health care provider says that it is safe.   General instructions  Take over-the-counter and prescription medicines only as told by your health care provider.  Do not use any products that contain nicotine or tobacco, such as cigarettes, e-cigarettes, and chewing tobacco. These can delay incision healing. If you need help quitting, ask your health care provider.  Keep all follow-up visits as told by your health care provider. This is important. Contact a health care provider if:  You have increased pain.  You have a fever.  You have nausea or vomiting.  You have any of these signs of infection: ? More redness, swelling, or pain around your treatment area. ? Fluid or blood coming from your treatment area. ? Warmth coming from your treatment area. ? Pus or a bad smell coming from your treatment area.  You do not have a bowel movement for 2 days. Get help right away if you have:  Severe pain.  Trouble swallowing or breathing.  Severe weakness or dizziness.  Chest pain or shortness of breath. These symptoms may represent a serious problem that is an emergency. Do not wait to see if the symptoms will go away. Get medical help right away. Call your local emergency services (911 in the [place] Do not drive yourself  to the hospital. Summary  After the procedure, it is common for the treatment area to be sore, mildly painful, and swollen.  If you have a dressing, change it as told by your health care provider.  Follow instructions from your health care provider about any activity limitations.  Contact a health care provider if you have increased pain or a fever. This information is not intended to replace advice given to you by your health care provider. Make sure you discuss any questions you  have with your health care provider. Document Revised: 07/18/2019 Document Reviewed: 07/18/2019 Elsevier Patient Education  2021 [employer].  Actinic Keratosis An actinic keratosis is a precancerous growth on the skin. If there is more than one growth, the condition is called actinic keratoses. Actinic keratoses appear most often on areas of skin that get a lot of sun exposure, including the scalp, face, ears, lips, upper back, forearms, and the backs of the hands. If left untreated, these growths may develop into a skin cancer called squamous cell carcinoma. It is important to have all these growths checked by a health care provider to determine the best treatment approach. What are the causes? Actinic keratoses are caused by getting too much ultraviolet (UV) radiation from the sun or other UV light sources. What increases the risk? You are more likely to develop this condition if you:  Have light-colored skin and blue eyes.  Have blond or red hair.  Spend a lot of time in the sun.  Do not protect your skin from the sun when outdoors.  Are an older person. The risk of developing an actinic keratosis increases with age. What are the signs or symptoms? Actinic keratoses feel like scaly, rough spots of skin. Symptoms of this condition include growths that may:  Be as small as a pinhead or as big as a quarter.  Itch, hurt, or feel sensitive.  Be skin-colored, light tan, dark tan, pink, or a combination of any of these colors. In most cases, the growths become red.  Have a small piece of pink or gray skin (skin tag) growing from them. It may be easier to notice actinic keratoses by feeling them, rather than seeing them. Sometimes, actinic keratoses disappear, but many reappear a few days to a few weeks later.   How is this diagnosed? This condition is usually diagnosed with a physical exam.  A tissue sample may be removed from the actinic keratosis and examined under a microscope  (biopsy). How is this treated? If needed, this condition may be treated by:  Scraping off the actinic keratosis (curettage).  Freezing the actinic keratosis with liquid nitrogen (cryosurgery). This causes the growth to eventually fall off the skin.  Applying medicated creams or gels to destroy the cells in the growth.  Applying chemicals to the actinic keratosis to make the outer layers of skin peel off (chemical peel).  Using photodynamic therapy. In this procedure, medicated cream is applied to the actinic keratosis. This cream increases your skin's sensitivity to light. Then, a strong light is aimed at the actinic keratosis to destroy cells in the growth. Follow these instructions at home: Skin care  Apply cool, wet cloths (cool compresses) to the affected areas.  Do not scratch your skin.  Check your skin regularly for any growths, especially growths that: ? Start to itch or bleed. ? Change in size, shape, or color. Caring for the treated area  Keep the treated area clean and dry as told by your health  care provider.  Do not apply any medicine, cream, or lotion to the treated area unless your health care provider tells you to do that.  Do not pick at blisters or try to break them open. This can cause infection and scarring.  If you have red or irritated skin after treatment, follow instructions from your health care provider about how to take care of the treated area. Make sure you: ? Wash your hands with soap and water before you change your bandage (dressing). If soap and water are not available, use hand sanitizer. ? Change your dressing as told by your health care provider.  If you have red or irritated skin after treatment, check your treated area every day for signs of infection. Check for: ? Redness, swelling, or pain. ? Fluid or blood. ? Warmth. ? Pus or a bad smell. General instructions  Take or apply over-the-counter and prescription medicines only as told by  your health care provider.  Return to your normal activities as told by your health care provider. Ask your health care provider what activities are safe for you.  Have a skin exam done every year by a health care provider who is a skin specialist (dermatologist).  Keep all follow-up visits as told by your health care provider. This is important. Lifestyle  Do not use any products that contain nicotine or tobacco, such as cigarettes and e-cigarettes. If you need help quitting, ask your health care provider.  Take steps to protect your skin from the sun. ? Try to avoid the sun between 10:00 a.m. and 4:00 p.m. This is when the UV light is the strongest. ? Use a sunscreen or sunblock with SPF 30 (sun protection factor 30) or greater. ? Apply sunscreen before you are exposed to sunlight and reapply as often as directed by the instructions on the sunscreen container. ? Always wear sunglasses that have UV protection, and always wear a hat and clothing to protect your skin from sunlight. ? When possible, avoid medicines that increase your sensitivity to sunlight. ? Do not use tanning beds or other indoor tanning devices. Contact a health care provider if:  You notice any changes or new growths on your skin.  You have swelling, pain, or more redness around your treated area.  You have fluid or blood coming from your treated area.  Your treated area feels warm to the touch.  You have pus or a bad smell coming from your treated area.  You have a fever.  You have a blister that becomes large and painful. Summary  An actinic keratosis is a precancerous growth on the skin. If there is more than one growth, the condition is called actinic keratoses. In some cases, if left untreated, these growths can develop into skin cancer.  Check your skin regularly for any growths, especially growths that start to itch or bleed, or change in size, shape, or color.  Take steps to protect your skin from  the sun.  Contact a health care provider if you notice any changes or new growths on your skin.  Keep all follow-up visits as told by your health care provider. This is important. This information is not intended to replace advice given to you by your health care provider. Make sure you discuss any questions you have with your health care provider. Document Revised: 02/20/2018 Document Reviewed: 02/20/2018 Elsevier Patient Education  2021 [employer].  Seborrheic Keratosis A seborrheic keratosis is a common, noncancerous (benign) skin growth. These growths are  velvety, waxy, rough, tan, brown, or black spots that appear on the skin. These skin growths can be flat or raised, and scaly. What are the causes? The cause of this condition is not known. What increases the risk? You are more likely to develop this condition if you:  Have a family history of seborrheic keratosis.  Are 50 or older.  Are pregnant.  Have had estrogen replacement therapy. What are the signs or symptoms? Symptoms of this condition include growths on the face, chest, shoulders, back, or other areas. These growths:  Are usually painless, but may become irritated and itchy.  Can be yellow, brown, black, or other colors.  Are slightly raised or have a flat surface.  Are sometimes rough or wart-like in texture.  Are often velvety or waxy on the surface.  Are round or oval-shaped.  Often occur in groups, but may occur as a single growth.   How is this diagnosed? This condition is diagnosed with a medical history and physical exam.  A sample of the growth may be tested (skin biopsy).  You may need to see a skin specialist (dermatologist). How is this treated? Treatment is not usually needed for this condition, unless the growths are irritated or bleed often.  You may also choose to have the growths removed if you do not like their appearance. ? Most commonly, these growths are treated with a procedure  in which liquid nitrogen is applied to "freeze" off the growth (cryosurgery). ? They may also be burned off with electricity (electrocautery) or removed by scraping (curettage). Follow these instructions at home:  Watch your growth for any changes.  Keep all follow-up visits as told by your health care provider. This is important.  Do not scratch or pick at the growth or growths. This can cause them to become irritated or infected. Contact a health care provider if:  You suddenly have many new growths.  Your growth bleeds, itches, or hurts.  Your growth suddenly becomes larger or changes color. Summary  A seborrheic keratosis is a common, noncancerous (benign) skin growth.  Treatment is not usually needed for this condition, unless the growths are irritated or bleed often.  Watch your growth for any changes.  Contact a health care provider if you suddenly have many new growths or your growth suddenly becomes larger or changes color.  Keep all follow-up visits as told by your health care provider. This is important. This information is not intended to replace advice given to you by your health care provider. Make sure you discuss any questions you have with your health care provider. Document Revised: 02/22/2018 Document Reviewed: 02/22/2018 Elsevier Patient Education  2021 [employer].

## 2020-11-05 ENCOUNTER — Encounter: Payer: Self-pay | Admitting: Family Medicine

## 2020-11-06 MED ORDER — CARVEDILOL 3.125 MG PO TABS: 3.1250 mg | ORAL_TABLET | Freq: Two times a day (BID) | ORAL | 0 refills | Status: DC

## 2020-11-12 ENCOUNTER — Ambulatory Visit (INDEPENDENT_AMBULATORY_CARE_PROVIDER_SITE_OTHER): Payer: 59

## 2020-11-12 DIAGNOSIS — I428 Other cardiomyopathies: Secondary | ICD-10-CM

## 2020-11-12 LAB — CUP PACEART REMOTE DEVICE CHECK
Battery Remaining Longevity: 84 mo
Battery Remaining Percentage: 96 %
Brady Statistic RA Percent Paced: 0 %
Brady Statistic RV Percent Paced: 0 %
Date Time Interrogation Session: 20220120090900
HighPow Impedance: 52 Ohm
Implantable Lead Implant Date: 20090320
Implantable Lead Implant Date: 20090320
Implantable Lead Location: 753859
Implantable Lead Location: 753860
Implantable Lead Model: 158
Implantable Lead Model: 5076
Implantable Lead Serial Number: 182504
Implantable Pulse Generator Implant Date: 20160307
Lead Channel Impedance Value: 495 Ohm
Lead Channel Impedance Value: 499 Ohm
Lead Channel Pacing Threshold Amplitude: 0.5 V
Lead Channel Pacing Threshold Amplitude: 1 V
Lead Channel Pacing Threshold Pulse Width: 0.4 ms
Lead Channel Pacing Threshold Pulse Width: 0.4 ms
Lead Channel Setting Pacing Amplitude: 2 V
Lead Channel Setting Pacing Amplitude: 2.5 V
Lead Channel Setting Pacing Pulse Width: 0.4 ms
Lead Channel Setting Sensing Sensitivity: 0.6 mV
Pulse Gen Serial Number: 111826

## 2020-11-16 ENCOUNTER — Other Ambulatory Visit: Payer: Self-pay | Admitting: Family Medicine

## 2020-11-16 ENCOUNTER — Encounter: Payer: Self-pay | Admitting: Family Medicine

## 2020-11-16 DIAGNOSIS — J310 Chronic rhinitis: Secondary | ICD-10-CM

## 2020-11-16 DIAGNOSIS — J329 Chronic sinusitis, unspecified: Secondary | ICD-10-CM

## 2020-11-23 ENCOUNTER — Other Ambulatory Visit (HOSPITAL_COMMUNITY): Payer: Self-pay | Admitting: Internal Medicine

## 2020-11-23 ENCOUNTER — Encounter (HOSPITAL_COMMUNITY): Payer: Self-pay

## 2020-11-25 NOTE — Progress Notes (Signed)
Remote ICD transmission.   

## 2020-12-01 ENCOUNTER — Other Ambulatory Visit: Payer: Self-pay

## 2020-12-01 NOTE — Telephone Encounter (Signed)
  Prescription Request  12/01/2020  What is the name of the medication or equipment? eliquis 5 mg twice a day  Have you contacted your pharmacy to request a refill? (if applicable) yes  Which pharmacy would you like this sent to? [facility]   Patient notified that their request is being sent to the clinical staff for review and that they should receive a response within 2 business days.

## 2020-12-02 ENCOUNTER — Encounter (HOSPITAL_BASED_OUTPATIENT_CLINIC_OR_DEPARTMENT_OTHER): Payer: 59 | Admitting: Cardiology

## 2020-12-02 MED ORDER — APIXABAN 5 MG PO TABS: 5.0000 mg | ORAL_TABLET | Freq: Two times a day (BID) | ORAL | 3 refills | Status: DC

## 2020-12-02 NOTE — Telephone Encounter (Signed)
Pt aware refill sent to pharmacy 

## 2020-12-07 ENCOUNTER — Other Ambulatory Visit: Payer: Self-pay

## 2020-12-07 ENCOUNTER — Ambulatory Visit (HOSPITAL_BASED_OUTPATIENT_CLINIC_OR_DEPARTMENT_OTHER): Payer: 59 | Attending: Internal Medicine | Admitting: Cardiology

## 2020-12-07 DIAGNOSIS — G4719 Other hypersomnia: Secondary | ICD-10-CM

## 2020-12-09 NOTE — Procedures (Signed)
   Patient Name: [name] [name], [name] [name] Date: 12/07/2020 Gender: Female D.O.B: July 02, 1961 Age (years): 59 Referring Provider: Shaune Pascal Bensimhon Height (inches): 63 Interpreting Physician: Fransico Him MD, ABSM Weight (lbs): 230 RPSGT: Zadie Rhine BMI: 35 MRN: [id number] Neck Size: 15.00  CLINICAL INFORMATION Sleep Study Type: NPSG  Indication for sleep study: Excessive Daytime Sleepiness, OSA, Snoring  Epworth Sleepiness Score: 4  SLEEP STUDY TECHNIQUE As per the AASM Manual for the Scoring of Sleep and Associated Events v2.3 (April 2016) with a hypopnea requiring 4% desaturations.  The channels recorded and monitored were frontal, central and occipital EEG, electrooculogram (EOG), submentalis EMG (chin), nasal and oral airflow, thoracic and abdominal wall motion, anterior tibialis EMG, snore microphone, electrocardiogram, and pulse oximetry.  MEDICATIONS Medications self-administered by patient taken the night of the study : N/A  SLEEP ARCHITECTURE The study was initiated at 9:48:33 PM and ended at 4:41:37 AM.  Sleep onset time was 25.5 minutes and the sleep efficiency was 80.7%. The total sleep time was 333.5 minutes.  Stage REM latency was 106.0 minutes.  The patient spent 8.10% of the night in stage N1 sleep, 42.88% in stage N2 sleep, 19.19% in stage N3 and 29.8% in REM.  Alpha intrusion was absent.  Supine sleep was 13.94%.  RESPIRATORY PARAMETERS The overall apnea/hypopnea index (AHI) was 2.2 per hour. There were 1 total apneas, including 0 obstructive, 1 central and 0 mixed apneas. There were 11 hypopneas and 11 RERAs.  The AHI during Stage REM sleep was 1.8 per hour.  AHI while supine was 7.7 per hour.  The mean oxygen saturation was 93.16%. The minimum SpO2 during sleep was 84.00%.  moderate snoring was noted during this study.  CARDIAC DATA The 2 lead EKG demonstrated NSR. The mean heart rate was 72.17 beats per minute. Other EKG findings include:  PVCs.  LEG MOVEMENT DATA The total PLMS were 0 with a resulting PLMS index of 0.00. Associated arousal with leg movement index was 0.0 .  IMPRESSIONS - No significant obstructive sleep apnea occurred during this study (AHI = 2.2/h). - No significant central sleep apnea occurred during this study (CAI = 0.2/h). - Mild oxygen desaturation was noted during this study (Min O2 = 84.00%). - PVCs were noted during this study. - Clinically significant periodic limb movements did not occur during sleep. No significant associated arousals.  DIAGNOSIS - Normal study  RECOMMENDATIONS - Avoid alcohol, sedatives and other CNS depressants that may worsen sleep apnea and disrupt normal sleep architecture. - Sleep hygiene should be reviewed to assess factors that may improve sleep quality. - Weight management and regular exercise should be initiated or continued if appropriate.  [Electronically signed] 12/09/2020 12:37 PM  Fransico Him MD, ABSM Diplomate, American Board of Sleep Medicine

## 2020-12-23 ENCOUNTER — Other Ambulatory Visit: Payer: Self-pay | Admitting: Family Medicine

## 2020-12-23 DIAGNOSIS — Z9889 Other specified postprocedural states: Secondary | ICD-10-CM

## 2020-12-25 ENCOUNTER — Telehealth: Payer: Self-pay | Admitting: *Deleted

## 2020-12-25 ENCOUNTER — Other Ambulatory Visit (HOSPITAL_COMMUNITY): Payer: Self-pay | Admitting: Internal Medicine

## 2020-12-25 NOTE — Telephone Encounter (Signed)
-----   Message from Sueanne Margarita, MD sent at 12/09/2020 12:38 PM EST ----- Please let patient know that sleep study showed no significant sleep apnea.

## 2020-12-25 NOTE — Telephone Encounter (Signed)
Informed patient of sleep study results and patient understanding was verbalized. Patient understands her sleep study showed no significant sleep apnea. Pt is aware and agreeable to normal results. PER DPR, Left detailed message on voicemail and informed patient to call back with questions.

## 2021-01-29 NOTE — Progress Notes (Signed)
Cardiology Office Note:    Date:  02/04/2021   ID:  [name] [name], DOB 07/30/1961, MRN [id number]  PCP:  Janora Norlander Dora  Cardiologist:  Ena Dawley, MD  Advanced Practice Provider:  No care team member to display Electrophysiologist:  [name], MD   Referring MD: Janora Norlander, DO    History of Present Illness:    [name] [name] is a 60 y.o. female with a hx of breast cancer (s/p lumpectomy and XRT - refused chemo), hypothyroidism, HLD, TIA, GERD and systolic HF due to NICM with LVEF ~30% s/p AICD placement who was previously followed by Dr. [name] now returning to clinic for follow-up.  She has long h/o NICM dating back to 2007. Cath in 2017 with normal cors and EF 25-30%. S/p s-ICD. Medical therapy has been limited by multiple medication intolerances (Spironolactone caused body burning sensation, could not afford Bidil and had fatigue with high does of Entresto). Has been followed Dr. [name] in HF clinic. Underwent CPX which revealed normal functional capacity. Was doing well on last visit with Dr. [name] on 10/14/20.  Today, the patient states she has a very bothersome cough that has been ongoing since 03/2020. She has been seen by ENT who thinks it may be related to "nerve overstimulation." She has been placed on gabapentin which has been up-titrated to 300mg  BID. Also taking zyrtec and flonase. Despite these medications, her symptoms have persisted. She is wondering if it is due to the coreg and wants to trial going off the medication.  Otherwise, no LE edema, orthopnea, or PND. No lightheadedness or dizziness. Has DOE with stairs, but is able walk on flat ground for about 1/2 mile without needing to stop. She is concerned she may need her lasix again as she feels like her DOE is getting slightly worse than before. Notably is followed by Dr. [name] and has an appointment and TTE next week.  Past Medical History:   Diagnosis Date  . AICD (automatic cardioverter/defibrillator) present 09   [employer]- replaced 2016  . Arthritis   . Bradycardia   . Breast cancer ([place]) 02/24/2016   s/p lumpectomy and radiation but refused chemotherapy  . Chronic systolic CHF (congestive heart failure) ([place])   . Cough due to angiotensin-converting enzyme inhibitor   . Dizziness    Dizziness with standing after squatting, March, 2012  . Dyslipidemia    LDL elevation,Patient does not want statin  . Family history of breast cancer   . GERD (gastroesophageal reflux disease)   . H/O shortness of breath    CPX 10/18:  No evidence of cardiopulmonary limitation. Suspect exercise intolerance related to weight and deconditioning.   [name] Hypothyroidism   . ICD (implantable cardiac defibrillator) battery depletion    Dr. [name], June, 2009(artifactual atrial tachycardia response events addressed by reprogramming in parentheses  . Leg swelling    right leg  . Mitral regurgitation   . Nonischemic cardiomyopathy ([place])    Etiology unknown, diagnosed 2008  . Personal history of radiation therapy 2017  . Pneumonia due to COVID-19 virus 10/2019  . TIA (transient ischemic attack)    No CT or MRI abnormality, aspirin therapy    Past Surgical History:  Procedure Laterality Date  . BREAST BIOPSY Left 02/24/2016   U/S Core  . BREAST BIOPSY Left 02/24/2016   U/S Core  . BREAST LUMPECTOMY Left 04/05/2016   invasive ductal   . BREAST LUMPECTOMY WITH RADIOACTIVE  SEED AND SENTINEL LYMPH NODE BIOPSY Left 04/05/2016   Procedure: BREAST LUMPECTOMY WITH RADIOACTIVE SEED AND SENTINEL LYMPH NODE BIOPSY;  Surgeon: [name], MD;  Location: [facility];  Service: General;  Laterality: Left;  . CARDIAC CATHETERIZATION N/A 08/05/2016   Procedure: Left Heart Cath and Coronary Angiography;  Surgeon: Peter M Martinique, MD;  Location: [facility];  Service: Cardiovascular;  Laterality: N/A;  . CARDIAC DEFIBRILLATOR PLACEMENT  09   [employer] no remote  . COLONOSCOPY    . CYST EXCISION Left    back of leg-lt   . IMPLANTABLE CARDIOVERTER DEFIBRILLATOR GENERATOR CHANGE N/A 12/29/2014   Procedure: IMPLANTABLE CARDIOVERTER DEFIBRILLATOR GENERATOR CHANGE;  Surgeon: [name], MD;  Location: [facility] CATH LAB;  Service: Cardiovascular;  Laterality: N/A;  . LATERAL EPICONDYLE RELEASE  10/11/2012   Procedure: TENNIS ELBOW RELEASE;  Surgeon: [name], MD;  Location: [facility];  Service: Orthopedics;  Laterality: Right;  RIGHT ELBOW: TENOTOMY ELBOW LATERAL EPICONDYLITIS TENNIS ELBOW: RADIAL TUNNEL RELEASE  . PORT-A-CATH REMOVAL  04/13/2016   Procedure: MINOR REMOVAL PORT-A-CATH;  Surgeon: [name], MD;  Location: [facility];  Service: General;;  . PORTACATH PLACEMENT Right 04/05/2016   Procedure: INSERTION PORT-A-CATH WITH [place];  Surgeon: [name], MD;  Location: [facility];  Service: General;  Laterality: Right;  right IJ  . TOTAL THYROIDECTOMY  2001  . TUBAL LIGATION  94  . UPPER GI ENDOSCOPY      Current Medications: Current Meds  Medication Sig  . acetaminophen (TYLENOL) 500 MG tablet Take 500 mg by mouth every 6 (six) hours as needed for mild pain or moderate pain.  [name] albuterol (VENTOLIN HFA) 108 (90 Base) MCG/ACT inhaler Inhale 2 puffs into the lungs every 6 (six) hours as needed for wheezing or shortness of breath.  [name] apixaban (ELIQUIS) 5 MG TABS tablet Take 1 tablet (5 mg total) by mouth 2 (two) times daily.  . carvedilol (COREG) 3.125 MG tablet Take 1 tablet (3.125 mg total) by mouth 2 (two) times daily.  . cetirizine (ZYRTEC) 10 MG tablet Take 10 mg by mouth daily.  [name] dicyclomine (BENTYL) 10 MG capsule Take 10 mg by mouth 3 (three) times daily before meals.   [name] ENTRESTO 97-103 MG Take 1 tablet by mouth twice daily  . gabapentin (NEURONTIN) 300 MG capsule Take 1 capsule by mouth 2 (two) times daily.  [name] levothyroxine (EUTHYROX) 75 MCG tablet Take 1 tablet (75 mcg total) by mouth  daily before breakfast.  . omeprazole (PRILOSEC) 40 MG capsule Take 40 mg by mouth daily.     Allergies:   Avelox [moxifloxacin hcl in nacl], Clindamycin/lincomycin, Kiwi extract, Bactrim [sulfamethoxazole-trimethoprim], Levofloxacin, Simvastatin, and Spironolactone   Social History   Socioeconomic History  . Marital status: Widowed    Spouse name: Not on file  . Number of children: Not on file  . Years of education: Not on file  . Highest education level: Not on file  Occupational History  . Occupation: Widow  Tobacco Use  . Smoking status: Never Smoker  . Smokeless tobacco: Never Used  Vaping Use  . Vaping Use: Never used  Substance and Sexual Activity  . Alcohol use: No  . Drug use: No  . Sexual activity: Not on file  Other Topics Concern  . Not on file  Social History Narrative  . Not on file   Social Determinants of Health   Financial Resource Strain: Not on file  Food Insecurity:  Not on file  Transportation Needs: Not on file  Physical Activity: Not on file  Stress: Not on file  Social Connections: Not on file     Family History: The patient's family history includes Breast cancer in her maternal aunt; Breast cancer (age of onset: 38) in her mother; Cancer in her maternal aunt, mother, and paternal grandmother; Heart attack in her mother; Hypertension in her brother and sister. There is no history of Stroke or Diabetes.  ROS:   Please see the history of present illness.    Review of Systems  Constitutional: Negative for chills and fever.  HENT: Negative for hearing loss.   Eyes: Negative for blurred vision and redness.  Respiratory: Positive for cough and shortness of breath.   Cardiovascular: Negative for chest pain, palpitations, orthopnea, claudication, leg swelling and PND.  Gastrointestinal: Negative for melena, nausea and vomiting.  Genitourinary: Negative for dysuria and flank pain.  Musculoskeletal: Negative for falls.  Neurological: Negative for  dizziness and loss of consciousness.  Endo/Heme/Allergies: Negative for polydipsia.  Psychiatric/Behavioral: Negative for substance abuse.    EKGs/Labs/Other Studies Reviewed:    The following studies were reviewed today: TTE May 17, 2020: 1. Left ventricular ejection fraction, by estimation, is 35 to 40%. The  left ventricle has normal function. The left ventricle has no regional  wall motion abnormalities. Left ventricular diastolic parameters are  indeterminate.  2. Right ventricular systolic function is normal. The right ventricular  size is normal.  3. The mitral valve is normal in structure. No evidence of mitral valve  regurgitation. No evidence of mitral stenosis.  4. The aortic valve is tricuspid. Aortic valve regurgitation is not  visualized. No aortic stenosis is present.  5. The inferior vena cava is normal in size with greater than 50%  respiratory variability, suggesting right atrial pressure of 3 mmHg.   CPX 12/19/19: Exercise Time:  11:00  Speed (mph): 3.0    Grade (%): 7.5    RPE: 17   Reason stopped: dyspnea (7/10)   Additional symptoms: None reported   Resting HR: 85 Standing HR: 86 Peak HR: 161  (100% age predicted max HR)   BP rest: 128/84 Standing BP: 116/82 BP peak: 166/72   Peak VO2: 17.7 (97%% predicted peak VO2)   VE/VCO2 slope: 29   OUES: 2.02   Peak RER: 1.08   Ventilatory Threshold: 13.0 (71% predicted or measured peak VO2)   Peak RR 38   Peak Ventilation: 56.8   VE/MVV: 54%   PETCO2 at peak: 38   O2pulse: 11  (100% predicted O2pulse)    Interpretation   Notes: Patient gave a very good effort. Pulse-oximetry remained 97-99% for the duration of exercise.   ECG: Resting ECG in sinus rhythm with occasional PVCs. HR response appropriate. There were frequent PVCs during exercise with no sustained arrhythmias or ST-T changes.   PFT: Pre-exercise spirometry was within normal limits. The MVV was normal.   CPX:  Exercise testing with gas exchange demonstrates a normal peak VO2 of 17.7 ml/kg/min (97% of the age/gender/weight matched sedentary norms). The RER of 1.08 indicates a near maximal effort. When adjusted to the patient's ideal body weight of 153.1 lb (69.5 kg) the peak VO2 is 24.7 ml/kg (ibw)/min (108% of the ibw-adjusted predicted). The VE/VCO2 slope is normal. The oxygen uptake efficiency slope (OUES) is normal. The VO2 at the ventilatory threshold was normal at 71% of the predicted peak VO2. At peak exercise, the ventilation reached 54% of the measured MVV indicating ventilatory  reserve remained. The O2pulse (a surrogate for stroke volume) increased with incremental exercise, reaching peak 11 ml/beat (100% predicted).    Conclusion: Exercise testing with gas exchange demonstrates normal functional capacity when compared to matched sedentary norms. There is no clear cardiopulmonary limitation. With corrections for ideal body weight, patient's body habitus appears the limiting factor. Overall CPX essentially unchanged from 2018.  Cath 2017:  There is severe left ventricular systolic dysfunction.  LV end diastolic pressure is normal.  The left ventricular ejection fraction is 25-35% by visual estimate.   1. Normal coronary anatomy 2. Severe LV dysfunction 3. Elevated LV EDP  Plan: medical management.     EKG:  EKG is ordered today.  The ekg ordered today demonstrates NSR with HR 72  Recent Labs: 02/20/2020: Magnesium 2.1 04/21/2020: B Natriuretic Peptide 45.0 06/05/2020: ALT 9 10/05/2020: TSH 1.370 10/14/2020: BUN 9; Creatinine, Ser 0.91; Hemoglobin 11.1; Platelets 235; Potassium 4.0; Sodium 142  Recent Lipid Panel    Component Value Date/Time   CHOL 207 (H) 05/15/2019 1034   TRIG 81 05/15/2019 1034   HDL 64 05/15/2019 1034   CHOLHDL 3.2 05/15/2019 1034   LDLCALC 127 (H) 05/15/2019 1034     Physical Exam:    VS:  BP 124/82   Pulse 72   Ht 5\' 8"  (1.727 m)   Wt 232 lb 12.8 oz  (105.6 kg)   SpO2 98%   BMI 35.40 kg/m     Wt Readings from Last 3 Encounters:  02/04/21 232 lb 12.8 oz (105.6 kg)  12/07/20 230 lb (104.3 kg)  11/04/20 230 lb (104.3 kg)     GEN:  Well nourished, well developed in no acute distress HEENT: Normal NECK: No JVD; No carotid bruits CARDIAC: RRR, no murmurs, rubs, gallops RESPIRATORY:  Clear to auscultation without rales, wheezing or rhonchi  ABDOMEN: Soft, non-tender, non-distended MUSCULOSKELETAL:  No edema; No deformity  SKIN: Warm and dry NEUROLOGIC:  Alert and oriented x 3 PSYCHIATRIC:  Normal affect   ASSESSMENT:    1. Chronic systolic CHF (congestive heart failure) ([place])   2. Essential hypertension   3. Mixed hyperlipidemia   4. Dual implantable cardioverter-defibrillator in situ   5. NICM (nonischemic cardiomyopathy) ([place])    PLAN:    In order of problems listed above:  #Chronic Systolic HF: #Non-ischemic CM: Longstanding NICM since 2007. Cath 2017 with normal coronaries; EF 25-30% s/p ICD placement. CPX with normal functional capacity. Currently with NYHA class II symptoms.  -Check BNP given reports of feeling more dyspneic on exertion; appears euvolemic on exam today -Follow-up scheduled with Dr. [name] next week -TTE next week -Continue entresto 97/103mg  BID (may be cause of cough; will touch base with Dr. [name] before adjusting/stopping as she is intolerant of many other HF medications and hopefully she can manage to stay on entresto) -Continue coreg 3.125mg  BID--encouraged her to continue as not likely to cause cough -Off laisx -No farxiga to avoid yeast infections -Did not tolerate spiro and could not afford bidil  #HTN: -Continue coreg and entresto as above  #HLD: LDL 127. No coronary disease on cath. Intolerant to simva. -Check lipid panel today -If LDL>100, plan to start crestor 5mg  daily  Patient can continue to follow-up with Dr. [name] and see [place] here as needed   Medication  Adjustments/Labs and Tests Ordered: Current medicines are reviewed at length with the patient today.  Concerns regarding medicines are outlined above.  Orders Placed This Encounter  Procedures  . Lipid panel  .  Pro b natriuretic peptide (BNP)  . EKG 12-Lead   No orders of the defined types were placed in this encounter.   Patient Instructions  Medication Instructions:  NO CHNAGES *If you need a refill on your cardiac medications before your next appointment, please call your pharmacy*   Lab Work: TODAY  LIPID AND BNP If you have labs (blood work) drawn today and your tests are completely normal, you will receive your results only by: [name] MyChart Message (if you have MyChart) OR . A paper copy in the mail If you have any lab test that is abnormal or we need to change your treatment, we will call you to review the results.   Testing/Procedures: NONE   Follow-Up: At [facility], you and your health needs are our priority.  As part of our continuing mission to provide you with exceptional heart care, we have created designated Provider Care Teams.  These Care Teams include your primary Cardiologist (physician) and Advanced Practice Providers (APPs -  Physician Assistants and Nurse Practitioners) who all work together to provide you with the care you need, when you need it.  We recommend signing up for the patient portal called "MyChart".  Sign up information is provided on this After Visit Summary.  MyChart is used to connect with patients for Virtual Visits (Telemedicine).  Patients are able to view lab/test results, encounter notes, upcoming appointments, etc.  Non-urgent messages can be sent to your provider as well.   To learn more about what you can do with MyChart, go to [email or web address].    Your next appointment:   AS NEEDED  Provider:   Gwyndolyn Kaufman, MD   Other Instructions NONE     Signed, Freada Bergeron, MD  02/04/2021 11:03 AM    [place]

## 2021-02-04 ENCOUNTER — Ambulatory Visit: Payer: 59 | Admitting: Cardiology

## 2021-02-04 ENCOUNTER — Other Ambulatory Visit: Payer: Self-pay

## 2021-02-04 ENCOUNTER — Encounter: Payer: Self-pay | Admitting: Cardiology

## 2021-02-04 VITALS — BP 124/82 | HR 72 | Ht 68.0 in | Wt 232.8 lb

## 2021-02-04 DIAGNOSIS — E782 Mixed hyperlipidemia: Secondary | ICD-10-CM | POA: Diagnosis not present

## 2021-02-04 DIAGNOSIS — I10 Essential (primary) hypertension: Secondary | ICD-10-CM | POA: Diagnosis not present

## 2021-02-04 DIAGNOSIS — I428 Other cardiomyopathies: Secondary | ICD-10-CM

## 2021-02-04 DIAGNOSIS — I5022 Chronic systolic (congestive) heart failure: Secondary | ICD-10-CM | POA: Diagnosis not present

## 2021-02-04 DIAGNOSIS — Z95810 Presence of automatic (implantable) cardiac defibrillator: Secondary | ICD-10-CM

## 2021-02-04 NOTE — Patient Instructions (Addendum)
Medication Instructions:  NO CHNAGES *If you need a refill on your cardiac medications before your next appointment, please call your pharmacy*   Lab Work: TODAY  LIPID AND BNP If you have labs (blood work) drawn today and your tests are completely normal, you will receive your results only by: [name] MyChart Message (if you have MyChart) OR . A paper copy in the mail If you have any lab test that is abnormal or we need to change your treatment, we will call you to review the results.   Testing/Procedures: NONE   Follow-Up: At [facility], you and your health needs are our priority.  As part of our continuing mission to provide you with exceptional heart care, we have created designated Provider Care Teams.  These Care Teams include your primary Cardiologist (physician) and Advanced Practice Providers (APPs -  Physician Assistants and Nurse Practitioners) who all work together to provide you with the care you need, when you need it.  We recommend signing up for the patient portal called "MyChart".  Sign up information is provided on this After Visit Summary.  MyChart is used to connect with patients for Virtual Visits (Telemedicine).  Patients are able to view lab/test results, encounter notes, upcoming appointments, etc.  Non-urgent messages can be sent to your provider as well.   To learn more about what you can do with MyChart, go to [email or web address].    Your next appointment:   AS NEEDED  Provider:   Gwyndolyn Kaufman, MD   Other Instructions NONE

## 2021-02-05 ENCOUNTER — Telehealth: Payer: Self-pay | Admitting: *Deleted

## 2021-02-05 DIAGNOSIS — E782 Mixed hyperlipidemia: Secondary | ICD-10-CM

## 2021-02-05 LAB — LIPID PANEL
Chol/HDL Ratio: 3.4 ratio (ref 0.0–4.4)
Cholesterol, Total: 210 mg/dL — ABNORMAL HIGH (ref 100–199)
HDL: 61 mg/dL (ref >39)
LDL Chol Calc (NIH): 132 mg/dL — ABNORMAL HIGH (ref 0–99)
Triglycerides: 98 mg/dL (ref 0–149)
VLDL Cholesterol Cal: 17 mg/dL (ref 5–40)

## 2021-02-05 LAB — PRO B NATRIURETIC PEPTIDE: NT-Pro BNP: 257 pg/mL (ref 0–287)

## 2021-02-05 MED ORDER — ROSUVASTATIN CALCIUM 5 MG PO TABS: 5.0000 mg | ORAL_TABLET | Freq: Every day | ORAL | 3 refills | Status: DC

## 2021-02-05 NOTE — Telephone Encounter (Signed)
-----   Message from Freada Bergeron, MD sent at 02/05/2021 11:32 AM EDT ----- Her fluid levels look good. No need to start lasix at this time.   Her cholesterol is elevated at 132. Can we do a trial of low dose crestor 5mg  daily. If she does not tolerate this, we will change her to zetia 10mg  daily.

## 2021-02-05 NOTE — Telephone Encounter (Signed)
The patient has been notified of the result and verbalized understanding.  All questions (if any) were answered. Darrell Jewel, RN 02/05/2021 2:26 PM   Agreeable to Crestor 5 mg daily

## 2021-02-11 ENCOUNTER — Other Ambulatory Visit: Payer: Self-pay

## 2021-02-11 ENCOUNTER — Ambulatory Visit (INDEPENDENT_AMBULATORY_CARE_PROVIDER_SITE_OTHER): Payer: 59

## 2021-02-11 ENCOUNTER — Ambulatory Visit (HOSPITAL_BASED_OUTPATIENT_CLINIC_OR_DEPARTMENT_OTHER)
Admission: RE | Admit: 2021-02-11 | Discharge: 2021-02-11 | Disposition: A | Discharge status: Home / Self Care | Payer: 59 | Source: Ambulatory Visit | Attending: Internal Medicine | Admitting: Internal Medicine

## 2021-02-11 ENCOUNTER — Ambulatory Visit (HOSPITAL_COMMUNITY)
Admission: RE | Admit: 2021-02-11 | Discharge: 2021-02-11 | Disposition: A | Discharge status: Home / Self Care | Payer: 59 | Source: Ambulatory Visit | Attending: Internal Medicine | Admitting: Internal Medicine

## 2021-02-11 ENCOUNTER — Encounter (HOSPITAL_COMMUNITY): Payer: Self-pay | Admitting: Internal Medicine

## 2021-02-11 VITALS — BP 122/78 | HR 66 | Wt 229.6 lb

## 2021-02-11 DIAGNOSIS — Z95810 Presence of automatic (implantable) cardiac defibrillator: Secondary | ICD-10-CM | POA: Diagnosis not present

## 2021-02-11 DIAGNOSIS — E785 Hyperlipidemia, unspecified: Secondary | ICD-10-CM | POA: Diagnosis not present

## 2021-02-11 DIAGNOSIS — I428 Other cardiomyopathies: Secondary | ICD-10-CM | POA: Insufficient documentation

## 2021-02-11 DIAGNOSIS — R059 Cough, unspecified: Secondary | ICD-10-CM | POA: Insufficient documentation

## 2021-02-11 DIAGNOSIS — Z79899 Other long term (current) drug therapy: Secondary | ICD-10-CM | POA: Insufficient documentation

## 2021-02-11 DIAGNOSIS — Z8249 Family history of ischemic heart disease and other diseases of the circulatory system: Secondary | ICD-10-CM | POA: Diagnosis not present

## 2021-02-11 DIAGNOSIS — I110 Hypertensive heart disease with heart failure: Secondary | ICD-10-CM | POA: Insufficient documentation

## 2021-02-11 DIAGNOSIS — Z7901 Long term (current) use of anticoagulants: Secondary | ICD-10-CM | POA: Diagnosis not present

## 2021-02-11 DIAGNOSIS — I5022 Chronic systolic (congestive) heart failure: Secondary | ICD-10-CM | POA: Diagnosis not present

## 2021-02-11 DIAGNOSIS — R0683 Snoring: Secondary | ICD-10-CM | POA: Diagnosis not present

## 2021-02-11 DIAGNOSIS — I10 Essential (primary) hypertension: Secondary | ICD-10-CM | POA: Diagnosis not present

## 2021-02-11 LAB — CUP PACEART REMOTE DEVICE CHECK
Battery Remaining Longevity: 78 mo
Battery Remaining Percentage: 90 %
Brady Statistic RA Percent Paced: 0 %
Brady Statistic RV Percent Paced: 0 %
Date Time Interrogation Session: 20220421015800
HighPow Impedance: 52 Ohm
Implantable Lead Implant Date: 20090320
Implantable Lead Implant Date: 20090320
Implantable Lead Location: 753859
Implantable Lead Location: 753860
Implantable Lead Model: 158
Implantable Lead Model: 5076
Implantable Lead Serial Number: 182504
Implantable Pulse Generator Implant Date: 20160307
Lead Channel Impedance Value: 487 Ohm
Lead Channel Impedance Value: 493 Ohm
Lead Channel Pacing Threshold Amplitude: 0.5 V
Lead Channel Pacing Threshold Amplitude: 1 V
Lead Channel Pacing Threshold Pulse Width: 0.4 ms
Lead Channel Pacing Threshold Pulse Width: 0.4 ms
Lead Channel Setting Pacing Amplitude: 2 V
Lead Channel Setting Pacing Amplitude: 2.5 V
Lead Channel Setting Pacing Pulse Width: 0.4 ms
Lead Channel Setting Sensing Sensitivity: 0.6 mV
Pulse Gen Serial Number: 111826

## 2021-02-11 LAB — ECHOCARDIOGRAM COMPLETE
Area-P 1/2: 3.6 cm2
S' Lateral: 3.4 cm

## 2021-02-11 MED ORDER — EPLERENONE 25 MG PO TABS: 12.5000 mg | ORAL_TABLET | Freq: Every day | ORAL | 6 refills | Status: DC

## 2021-02-11 NOTE — Progress Notes (Signed)
  Echocardiogram 2D Echocardiogram has been performed.  [name] [name] 02/11/2021, 1:51 PM

## 2021-02-11 NOTE — Progress Notes (Signed)
ADVANCED HF CLINIC NOTE  Primary Cardiologist: Dr. [name] PCP: Dr. Lajuana Ripple  HPI:  [name] [name] is a 60 y.o.female with breast cancer (s/p lumpectomy and XRT - refused chemo), hypothyroidism referred by Dr. [name] for further evaluation of systolic HF due to NICM. EF ~30%   She has long h/o NICM dating back to 2007. Cath in 2017 with normal cors and EF 25-30%. S/p s-ICD. Medical therapy has been limited by multiple medication intolerances (Spironolactone caused body burning sensation, could not afford Bidil and had fatigue with high does of Entresto).   Had COVID PNA in 1/21. Was treated at home. Her mother passed from [place].   Last echo 1/21 EF 25-30% RV normal   I saw her for first visit in 2/21. Was feeling well. Entresto increased to 49/51 bid  We got CPX which showed normal functional capacity.   CPX 12/19/19 FVC 2.75 (87%)    FEV1 2.14 (86%)     FEV1/FVC 78 (98%)     MVV 105 (104%)   Resting HR: 85 Standing HR: 86 Peak HR: 161  (100% age predicted max HR)  BP rest: 128/84 Standing BP: 116/82 BP peak: 166/72  Peak VO2: 17.7 (97%% predicted peak VO2)  - adjusted for ibw peak VO2 is 24.7 ml/kg (ibw)/min VE/VCO2 slope: 29  Peak RER: 1.08  O2pulse: 11  (100% predicted O2pulse)    Here today for follow up. Says she is SOB with walking up steps or walking in store. No CP, edema, orthopnea or PND. Main issue is severe couch.     ICD interrogated personally in clinic: No VT/AF activity level 0.8hr/day   Past Medical History:  Diagnosis Date  . AICD (automatic cardioverter/defibrillator) present 09   [employer]- replaced 2016  . Arthritis   . Bradycardia   . Breast cancer ([place]) 02/24/2016   s/p lumpectomy and radiation but refused chemotherapy  . Chronic systolic CHF (congestive heart failure) ([place])   . Cough due to angiotensin-converting enzyme inhibitor   . Dizziness    Dizziness with standing after squatting, March, 2012  . Dyslipidemia    LDL  elevation,Patient does not want statin  . Family history of breast cancer   . GERD (gastroesophageal reflux disease)   . H/O shortness of breath    CPX 10/18:  No evidence of cardiopulmonary limitation. Suspect exercise intolerance related to weight and deconditioning.   [name] Hypothyroidism   . ICD (implantable cardiac defibrillator) battery depletion    Dr. [name], June, 2009(artifactual atrial tachycardia response events addressed by reprogramming in parentheses  . Leg swelling    right leg  . Mitral regurgitation   . Nonischemic cardiomyopathy ([place])    Etiology unknown, diagnosed 2008  . Personal history of radiation therapy 2017  . Pneumonia due to COVID-19 virus 10/2019  . TIA (transient ischemic attack)    No CT or MRI abnormality, aspirin therapy    Current Outpatient Medications  Medication Sig Dispense Refill  . acetaminophen (TYLENOL) 500 MG tablet Take 500 mg by mouth every 6 (six) hours as needed for mild pain or moderate pain.    [name] albuterol (VENTOLIN HFA) 108 (90 Base) MCG/ACT inhaler Inhale 2 puffs into the lungs every 6 (six) hours as needed for wheezing or shortness of breath. 8 g 2  . apixaban (ELIQUIS) 5 MG TABS tablet Take 1 tablet (5 mg total) by mouth 2 (two) times daily. 60 tablet 3  . carvedilol (COREG) 3.125 MG tablet Take 1 tablet (3.125 mg total) by  mouth 2 (two) times daily. 180 tablet 0  . cetirizine (ZYRTEC) 10 MG tablet Take 10 mg by mouth daily.    [name] dicyclomine (BENTYL) 10 MG capsule Take 10 mg by mouth 3 (three) times daily before meals.     [name] ENTRESTO 97-103 MG Take 1 tablet by mouth twice daily 60 tablet 3  . gabapentin (NEURONTIN) 300 MG capsule Take 1 capsule by mouth 2 (two) times daily.    [name] levothyroxine (EUTHYROX) 75 MCG tablet Take 1 tablet (75 mcg total) by mouth daily before breakfast. 90 tablet 2  . omeprazole (PRILOSEC) 40 MG capsule Take 40 mg by mouth daily.    . rosuvastatin (CRESTOR) 5 MG tablet Take 1 tablet (5 mg total) by mouth daily.  90 tablet 3   No current facility-administered medications for this encounter.    Allergies  Allergen Reactions  . Avelox [Moxifloxacin Hcl In Nacl] Shortness Of Breath    Shortness of breath  . Clindamycin/Lincomycin Shortness Of Breath and Diarrhea    Breathing/diarrhea    . Kiwi Extract Anaphylaxis    Makes pt. Feel like her throat is closing   . Bactrim [Sulfamethoxazole-Trimethoprim] Swelling  . Levofloxacin Other (See Comments)    JOINT PAIN JOINT PAIN  . Simvastatin Other (See Comments)    Joint pain  . Spironolactone Other (See Comments)    burning      Social History   Socioeconomic History  . Marital status: Widowed    Spouse name: Not on file  . Number of children: Not on file  . Years of education: Not on file  . Highest education level: Not on file  Occupational History  . Occupation: Widow  Tobacco Use  . Smoking status: Never Smoker  . Smokeless tobacco: Never Used  Vaping Use  . Vaping Use: Never used  Substance and Sexual Activity  . Alcohol use: No  . Drug use: No  . Sexual activity: Not on file  Other Topics Concern  . Not on file  Social History Narrative  . Not on file   Social Determinants of Health   Financial Resource Strain: Not on file  Food Insecurity: Not on file  Transportation Needs: Not on file  Physical Activity: Not on file  Stress: Not on file  Social Connections: Not on file  Intimate Partner Violence: Not on file     Family History  Problem Relation Age of Onset  . Heart attack Mother   . Cancer Mother        breast  . Breast cancer Mother 90  . Hypertension Sister   . Hypertension Brother   . Cancer Maternal Aunt        breast  . Breast cancer Maternal Aunt   . Cancer Paternal Grandmother        possible cancer, unknown type  . Stroke Neg Hx   . Diabetes Neg Hx     Vitals:   02/11/21 1411  BP: 122/78  Pulse: 66  SpO2: 98%  Weight: 104.1 kg (229 lb 9.6 oz)     Wt Readings from Last 3 Encounters:   02/11/21 104.1 kg (229 lb 9.6 oz)  02/04/21 105.6 kg (232 lb 12.8 oz)  12/07/20 104.3 kg (230 lb)   PHYSICAL EXAM: General:  Well appearing. No resp difficulty HEENT: normal Neck: supple. no JVD. Carotids 2+ bilat; no bruits. No lymphadenopathy or thryomegaly appreciated. Cor: PMI nondisplaced. Regular rate & rhythm. No rubs, gallops or murmurs. Lungs: clear Abdomen: soft, nontender,  nondistended. No hepatosplenomegaly. No bruits or masses. Good bowel sounds. Extremities: no cyanosis, clubbing, rash, edema Neuro: alert & orientedx3, cranial nerves grossly intact. moves all 4 extremities w/o difficulty. Affect pleasant   ASSESSMENT & PLAN:  1. Chronic systolic HF - longstanding NICM dating back to 2007. Unclear etiology - Cath in 2017 with normal cors and EF 25-30%. S/p s-ICD - Echo 11/20 read as 35-40% (likely 30-35%)  - Echo 1/21 EF 25-30%  RV normal  (post-COVID) - Echo today 02/11/21: EF 30-35% RV moderately down Personally reviewed - CPX testing 2/21 with normal functional capacity when corrected for ibw. No obvious HF limitation - NYHA III. Volume status ok  - GDMT limited by multiple medication intolerances (Spironolactone caused body burning sensation, could not afford Bidil) - Continue Entresto to 97/103 bid. - Continue carvedilol 3.125 bid. - We discussed Wilder Glade but would like to avoid given her propensity for yeast infections.  - I do not think that cough is from [facility] but she will hold for 2 days and see what happens.  - Will start eplerenone 12.5 mg daily. BMET in 1 week.   2. HTN - Blood pressure well controlled. Meds as above  3. Snoring - Pending sleep study. Will reorder.  4. Cough - As above, doubt Entresto but will hold for 2 days to check - Has failed Singulair in past   Glori Bickers, MD  2:48 PM

## 2021-02-11 NOTE — Addendum Note (Signed)
Encounter addended by: Scarlette Calico, RN on: 02/11/2021 3:25 PM  Actions taken: Pharmacy for encounter modified, Order list changed, Clinical Note Signed

## 2021-02-11 NOTE — Addendum Note (Signed)
Encounter addended by: Stanford Scotland, RN on: 02/11/2021 3:31 PM  Actions taken: Flowsheet accepted, Clinical Note Signed

## 2021-02-11 NOTE — Progress Notes (Signed)
ReDS Vest / Clip - 02/11/21 1500      ReDS Vest / Clip   Station Marker D    Ruler Value 31.5    ReDS Value Range Low volume    ReDS Actual Value 28

## 2021-02-11 NOTE — Patient Instructions (Signed)
Start Epleronone 12.5 mg (1/2 tab) Daily   Your physician recommends that you return for lab work in: 1 week, we have provided you prescription to have this done locally  Your physician recommends that you schedule a follow-up appointment in: 4 months  If you have any questions or concerns before your next appointment please send [place] a message through [place] or call our office at [phone].    TO LEAVE A MESSAGE FOR THE NURSE SELECT OPTION 2, PLEASE LEAVE A MESSAGE INCLUDING: . YOUR NAME . DATE OF BIRTH . CALL BACK NUMBER . REASON FOR CALL**this is important as we prioritize the call backs  [place] AS LONG AS YOU CALL BEFORE 4:00 PM  At the [facility], you and your health needs are our priority. As part of our continuing mission to provide you with exceptional heart care, we have created designated Provider Care Teams. These Care Teams include your primary Cardiologist (physician) and Advanced Practice Providers (APPs- Physician Assistants and Nurse Practitioners) who all work together to provide you with the care you need, when you need it.   You may see any of the following providers on your designated Care Team at your next follow up: [name] Dr Glori Bickers . Dr Loralie Champagne . Dr Vickki Muff . Darrick Grinder, NP . Lyda Jester, [place] . Audry Riles, PharmD   Please be sure to bring in all your medications bottles to every appointment.

## 2021-02-12 ENCOUNTER — Telehealth (HOSPITAL_COMMUNITY): Payer: Self-pay | Admitting: Pharmacist

## 2021-02-12 NOTE — Telephone Encounter (Signed)
Patient Advocate Encounter   Received notification from [employer] that prior authorization for Eplerenone is required.   PA submitted on CoverMyMeds Key [id number] Status is pending   Will continue to follow.   Audry Riles, PharmD, BCPS, BCCP, CPP Heart Failure Clinic Pharmacist [phone]

## 2021-02-15 ENCOUNTER — Encounter (HOSPITAL_COMMUNITY): Payer: Self-pay

## 2021-02-15 NOTE — Telephone Encounter (Signed)
Advanced Heart Failure Patient Advocate Encounter  Prior Authorization for eplerenone has been approved.    PA# [id number] Effective dates: 02/12/21 through 02/11/22  Audry Riles, PharmD, BCPS, BCCP, CPP Heart Failure Clinic Pharmacist [phone]

## 2021-02-16 ENCOUNTER — Encounter: Payer: Self-pay | Admitting: Family Medicine

## 2021-02-19 ENCOUNTER — Other Ambulatory Visit: Payer: Self-pay

## 2021-02-19 ENCOUNTER — Other Ambulatory Visit: Payer: Self-pay | Admitting: Internal Medicine

## 2021-02-19 ENCOUNTER — Other Ambulatory Visit: Payer: 59

## 2021-02-20 LAB — BASIC METABOLIC PANEL
BUN/Creatinine Ratio: 11 — ABNORMAL LOW (ref 12–28)
BUN: 9 mg/dL (ref 8–27)
CO2: 23 mmol/L (ref 20–29)
Calcium: 9.1 mg/dL (ref 8.7–10.3)
Chloride: 107 mmol/L — ABNORMAL HIGH (ref 96–106)
Creatinine, Ser: 0.85 mg/dL (ref 0.57–1.00)
Glucose: 91 mg/dL (ref 65–99)
Potassium: 4.2 mmol/L (ref 3.5–5.2)
Sodium: 147 mmol/L — ABNORMAL HIGH (ref 134–144)
eGFR: 78 mL/min/{1.73_m2} (ref >59)

## 2021-02-23 ENCOUNTER — Encounter (HOSPITAL_COMMUNITY): Payer: Self-pay

## 2021-03-01 ENCOUNTER — Encounter (HOSPITAL_COMMUNITY): Payer: Self-pay

## 2021-03-02 NOTE — Progress Notes (Signed)
Remote ICD transmission.   

## 2021-03-08 ENCOUNTER — Ambulatory Visit (INDEPENDENT_AMBULATORY_CARE_PROVIDER_SITE_OTHER): Payer: 59

## 2021-03-08 ENCOUNTER — Encounter: Payer: Self-pay | Admitting: Family Medicine

## 2021-03-08 ENCOUNTER — Other Ambulatory Visit: Payer: Self-pay

## 2021-03-08 ENCOUNTER — Ambulatory Visit
Admission: EM | Admit: 2021-03-08 | Discharge: 2021-03-08 | Disposition: A | Discharge status: Home / Self Care | Payer: 59 | Attending: Family Medicine | Admitting: Family Medicine

## 2021-03-08 DIAGNOSIS — R053 Chronic cough: Secondary | ICD-10-CM | POA: Diagnosis not present

## 2021-03-08 DIAGNOSIS — R0602 Shortness of breath: Secondary | ICD-10-CM

## 2021-03-08 DIAGNOSIS — J209 Acute bronchitis, unspecified: Secondary | ICD-10-CM

## 2021-03-08 MED ORDER — PROMETHAZINE-DM 6.25-15 MG/5ML PO SYRP: 5.0000 mL | ORAL_SOLUTION | Freq: Four times a day (QID) | ORAL | 0 refills | Status: DC | PRN

## 2021-03-08 MED ORDER — PREDNISONE 20 MG PO TABS: 40.0000 mg | ORAL_TABLET | Freq: Every day | ORAL | 0 refills | Status: DC

## 2021-03-08 NOTE — Discharge Instructions (Addendum)
Chest x-ray is normal.  I am going to treat you presumptively for possible acute bronchitis.  Treating with prednisone 40 mg once daily for 5 days.  This should improve any inflammation in the lungs that is causing her to have wheezing and coughing.  Also for acute cough Promethazine DM he can take up to 4 times daily as needed for cough.  None of these medications will interfere with your current cardiac regimen.  If symptoms worsen or do not improve follow-up with primary care doctor.  Your COVID 19 results should result within 3 days or less. Negative results are immediately resulted to Mychart. Positive results will receive a follow-up call from our clinic. If symptoms are present, I recommend home quarantine until results are known.  Alternate Tylenol and ibuprofen as needed for body aches and fever.  Symptom management per recommendations discussed today.  If any breathing difficulty or chest pain develops go immediately to the closest emergency department for evaluation.

## 2021-03-08 NOTE — ED Triage Notes (Signed)
Pt presents with c/o cough for past 6 days

## 2021-03-08 NOTE — ED Provider Notes (Signed)
[facility]    CSN: [id number] Arrival date & time: 03/08/21  1057      History   Chief Complaint Chief Complaint  Patient presents with  . Cough    HPI [name] [name] is a 60 y.o. female.   HPI Patient with a known history of CHF cough induced bronchospasms presents today with a persistent cough going into week 4.  She also endorses intermittent wheezing and shortness of breath and has been using her albuterol inhaler more frequently to improve symptoms.  She denies fever.  She was exposed to COVID approximately 10 days ago.  She felt the other that she may have had a fever but was unable to check due to her thermometer was not working.  She denies any precipitating factors such as nasal congestion, no unintentional weight gain, no edema involving the lower extremities.  She is beginning to lose her voice as she has been coughing constantly.  She is compliant with her medication regimen. Past Medical History:  Diagnosis Date  . AICD (automatic cardioverter/defibrillator) present 09   [employer]- replaced 2016  . Arthritis   . Bradycardia   . Breast cancer ([place]) 02/24/2016   s/p lumpectomy and radiation but refused chemotherapy  . Chronic systolic CHF (congestive heart failure) ([place])   . Cough due to angiotensin-converting enzyme inhibitor   . Dizziness    Dizziness with standing after squatting, March, 2012  . Dyslipidemia    LDL elevation,Patient does not want statin  . Family history of breast cancer   . GERD (gastroesophageal reflux disease)   . H/O shortness of breath    CPX 10/18:  No evidence of cardiopulmonary limitation. Suspect exercise intolerance related to weight and deconditioning.   [name] Hypothyroidism   . ICD (implantable cardiac defibrillator) battery depletion    Dr. [name], June, 2009(artifactual atrial tachycardia response events addressed by reprogramming in parentheses  . Leg swelling    right leg  . Mitral regurgitation   . Nonischemic  cardiomyopathy ([place])    Etiology unknown, diagnosed 2008  . Personal history of radiation therapy 2017  . Pneumonia due to COVID-19 virus 10/2019  . TIA (transient ischemic attack)    No CT or MRI abnormality, aspirin therapy    Patient Active Problem List   Diagnosis Date Noted  . NICM (nonischemic cardiomyopathy) ([place]) 07/23/2020  . Pulmonary embolism ([place]) 04/21/2020  . Post-surgical hypothyroidism 03/27/2020  . Lumbar arthropathy 07/15/2019  . Anemia 07/15/2019  . History of colonic polyps 05/07/2019  . Genetic testing 11/22/2016  . Family history of breast cancer   . Breast cancer- radiation June 2016 08/05/2016  . Abnormal stress test 08/01/2016  . Cardiac LV ejection fraction 30-35% 07/18/2016  . Arm numbness 03/14/2016  . Facial numbness 03/14/2016  . Breast cancer of upper-inner quadrant of left female breast ([place]) 03/09/2016  . Chronic systolic CHF (congestive heart failure) ([place]) 02/26/2014  . Ejection fraction < 50%   . Itching   . Dizziness   . Bradycardia   . GERD (gastroesophageal reflux disease)   . Hypothyroidism   . Hyperlipidemia   . Leg swelling   . Cough due to angiotensin-converting enzyme inhibitor   . Dual implantable cardioverter-defibrillator in situ   . FOOT PAIN 01/20/2010  . History of TIA 2007 01/19/2010    Past Surgical History:  Procedure Laterality Date  . BREAST BIOPSY Left 02/24/2016   U/S Core  . BREAST BIOPSY Left 02/24/2016   U/S Core  . BREAST  LUMPECTOMY Left 04/05/2016   invasive ductal   . BREAST LUMPECTOMY WITH RADIOACTIVE SEED AND SENTINEL LYMPH NODE BIOPSY Left 04/05/2016   Procedure: BREAST LUMPECTOMY WITH RADIOACTIVE SEED AND SENTINEL LYMPH NODE BIOPSY;  Surgeon: [name], MD;  Location: [facility];  Service: General;  Laterality: Left;  . CARDIAC CATHETERIZATION N/A 08/05/2016   Procedure: Left Heart Cath and Coronary Angiography;  Surgeon: Peter M Martinique, MD;  Location: [facility];  Service: Cardiovascular;   Laterality: N/A;  . CARDIAC DEFIBRILLATOR PLACEMENT  09   [employer] no remote  . COLONOSCOPY    . CYST EXCISION Left    back of leg-lt   . IMPLANTABLE CARDIOVERTER DEFIBRILLATOR GENERATOR CHANGE N/A 12/29/2014   Procedure: IMPLANTABLE CARDIOVERTER DEFIBRILLATOR GENERATOR CHANGE;  Surgeon: [name], MD;  Location: [facility];  Service: Cardiovascular;  Laterality: N/A;  . LATERAL EPICONDYLE RELEASE  10/11/2012   Procedure: TENNIS ELBOW RELEASE;  Surgeon: [name], MD;  Location: [facility];  Service: Orthopedics;  Laterality: Right;  RIGHT ELBOW: TENOTOMY ELBOW LATERAL EPICONDYLITIS TENNIS ELBOW: RADIAL TUNNEL RELEASE  . PORT-A-CATH REMOVAL  04/13/2016   Procedure: MINOR REMOVAL PORT-A-CATH;  Surgeon: [name], MD;  Location: [place];  Service: General;;  . PORTACATH PLACEMENT Right 04/05/2016   Procedure: INSERTION PORT-A-CATH WITH [place];  Surgeon: [name], MD;  Location: [facility];  Service: General;  Laterality: Right;  right IJ  . TOTAL THYROIDECTOMY  2001  . TUBAL LIGATION  94  . UPPER GI ENDOSCOPY      OB History   No obstetric history on file.      Home Medications    Prior to Admission medications   Medication Sig Start Date End Date Taking? Authorizing Provider  acetaminophen (TYLENOL) 500 MG tablet Take 500 mg by mouth every 6 (six) hours as needed for mild pain or moderate pain.    [provider]  albuterol (VENTOLIN HFA) 108 (90 Base) MCG/ACT inhaler Inhale 2 puffs into the lungs every 6 (six) hours as needed for wheezing or shortness of breath. 08/03/20   Sharion Balloon, FNP  apixaban (ELIQUIS) 5 MG TABS tablet Take 1 tablet (5 mg total) by mouth 2 (two) times daily. 12/02/20   Janora Norlander, DO  carvedilol (COREG) 3.125 MG tablet Take 1 tablet (3.125 mg total) by mouth 2 (two) times daily. 11/06/20   Janora Norlander, DO  cetirizine (ZYRTEC) 10 MG tablet Take 10 mg by mouth daily.    [provider]  dicyclomine (BENTYL) 10 MG capsule Take 10 mg by mouth 3 (three) times daily before meals.     [provider]  ENTRESTO 97-103 MG Take 1 tablet by mouth twice daily 12/25/20   Bensimhon, Shaune Pascal, MD  eplerenone (INSPRA) 25 MG tablet Take 0.5 tablets (12.5 mg total) by mouth daily. 02/11/21   Bensimhon, Shaune Pascal, MD  gabapentin (NEURONTIN) 300 MG capsule Take 1 capsule by mouth 2 (two) times daily. 01/23/21   [provider]  levothyroxine (EUTHYROX) 75 MCG tablet Take 1 tablet (75 mcg total) by mouth daily before breakfast. 07/31/20   Ronnie Doss M, DO  omeprazole (PRILOSEC) 40 MG capsule Take 40 mg by mouth daily.    [provider]  rosuvastatin (CRESTOR) 5 MG tablet Take 1 tablet (5 mg total) by mouth daily. 02/05/21   Freada Bergeron, MD    Family History Family History  Problem Relation Age of Onset  .  Heart attack Mother   . Cancer Mother        breast  . Breast cancer Mother 43  . Hypertension Sister   . Hypertension Brother   . Cancer Maternal Aunt        breast  . Breast cancer Maternal Aunt   . Cancer Paternal Grandmother        possible cancer, unknown type  . Stroke Neg Hx   . Diabetes Neg Hx     Social History Social History   Tobacco Use  . Smoking status: Never Smoker  . Smokeless tobacco: Never Used  Vaping Use  . Vaping Use: Never used  Substance Use Topics  . Alcohol use: No  . Drug use: No     Allergies   Avelox [moxifloxacin hcl in nacl], Clindamycin/lincomycin, Kiwi extract, Bactrim [sulfamethoxazole-trimethoprim], Levofloxacin, Simvastatin, and Spironolactone   Review of Systems Review of Systems Pertinent negatives listed in HPI  Physical Exam Triage Vital Signs ED Triage Vitals  Enc Vitals Group     BP 03/08/21 1510 114/88     Pulse Rate 03/08/21 1507 87     Resp 03/08/21 1507 18     Temp 03/08/21 1507 98.6 F (37 C)     Temp Source 03/08/21 1507 Oral     SpO2 03/08/21 1507 98 %      Weight --      Height --      Head Circumference --      Peak Flow --      Pain Score 03/08/21 1512 0     Pain Loc --      Pain Edu? --      Excl. in [place]? --    No data found.  Updated Vital Signs BP 114/88   Pulse 87   Temp 98.6 F (37 C) (Oral)   Resp 18   SpO2 98%   Visual Acuity Right Eye Distance:   Left Eye Distance:   Bilateral Distance:    Right Eye Near:   Left Eye Near:    Bilateral Near:     Physical Exam  General Appearance:    Alert, cooperative, no distress  HENT:   Normocephalic, ears normal, nares mucosal edema with congestion, rhinorrhea, oropharynx    Eyes:    PERRL, conjunctiva/corneas clear, EOM's intact       Lungs:     Clear to auscultation bilaterally, respirations unlabored  Heart:    Regular rate and rhythm  Neurologic:   Awake, alert, oriented x 3. No apparent focal neurological           defect.      UC Treatments / Results  Labs (all labs ordered are listed, but only abnormal results are displayed) Labs Reviewed  COVID-19, FLU A+B NAA    EKG   Radiology DG Chest 2 View  Result Date: 03/08/2021 CLINICAL DATA:  Shortness of breath for several weeks, initial encounter EXAM: CHEST - 2 VIEW COMPARISON:  08/03/2020 FINDINGS: Cardiac shadow is stable. Defibrillator is again noted and stable. Lungs are well aerated without focal infiltrate or sizable effusion. No bony abnormality is seen. IMPRESSION: No acute abnormality noted. Electronically Signed   By: Inez Catalina M.D.   On: 03/08/2021 16:27     Procedures Procedures (including critical care time)  Medications Ordered in UC Medications - No data to display  Initial Impression / Assessment and Plan / UC Course  I have reviewed the triage vital signs and the nursing notes.  Pertinent  labs & imaging results that were available during my care of the patient were reviewed by me and considered in my medical decision making (see chart for details).     Chest x-ray unremarkable. No  evidence of fluid overload. Pt has had a prior reaction to ACE inhibitors. Concern that cough may be related to possible Entresto vs an acute bronchitis . Discussed with patient agreed to treat for bronchitis if symptoms resolve, no additional follow-up required. If cough persists follow-up with cardiologist. Treatment per discharge medications Final Clinical Impressions(s) / UC Diagnoses   Final diagnoses:  Persistent cough for 3 weeks or longer  SOB (shortness of breath)  Chronic systolic congestive heart failure [facility])     Discharge Instructions     Chest x-ray is normal.  I am going to treat you presumptively for possible acute bronchitis.  Treating with prednisone 40 mg once daily for 5 days.  This should improve any inflammation in the lungs that is causing her to have wheezing and coughing.  Also for acute cough Promethazine DM he can take up to 4 times daily as needed for cough.  None of these medications will interfere with your current cardiac regimen.  If symptoms worsen or do not improve follow-up with primary care doctor.  Your COVID 19 results should result within 3 days or less. Negative results are immediately resulted to Mychart. Positive results will receive a follow-up call from our clinic. If symptoms are present, I recommend home quarantine until results are known.  Alternate Tylenol and ibuprofen as needed for body aches and fever.  Symptom management per recommendations discussed today.  If any breathing difficulty or chest pain develops go immediately to the closest emergency department for evaluation.     ED Prescriptions    Medication Sig Dispense Auth. Provider   predniSONE (DELTASONE) 20 MG tablet Take 2 tablets (40 mg total) by mouth daily with breakfast. 10 tablet Scot Jun, FNP   promethazine-dextromethorphan (PROMETHAZINE-DM) 6.25-15 MG/5ML syrup Take 5 mLs by mouth 4 (four) times daily as needed for cough. 120 mL Scot Jun, FNP     PDMP not  reviewed this encounter.   Scot Jun, FNP 03/12/21 2236

## 2021-03-09 LAB — COVID-19, FLU A+B NAA
Influenza A, NAA: NOT DETECTED
Influenza B, NAA: NOT DETECTED
SARS-CoV-2, NAA: NOT DETECTED

## 2021-03-15 ENCOUNTER — Encounter: Payer: Self-pay | Admitting: Family Medicine

## 2021-03-16 ENCOUNTER — Encounter: Payer: Self-pay | Admitting: Family Medicine

## 2021-03-16 ENCOUNTER — Ambulatory Visit (INDEPENDENT_AMBULATORY_CARE_PROVIDER_SITE_OTHER): Payer: 59 | Admitting: Family Medicine

## 2021-03-16 VITALS — BP 109/76 | HR 70 | Temp 97.2°F

## 2021-03-16 DIAGNOSIS — J0100 Acute maxillary sinusitis, unspecified: Secondary | ICD-10-CM | POA: Diagnosis not present

## 2021-03-16 MED ORDER — PREDNISONE 10 MG PO TABS: ORAL_TABLET | ORAL | 0 refills | Status: DC

## 2021-03-16 MED ORDER — AMOXICILLIN-POT CLAVULANATE 875-125 MG PO TABS: 1.0000 | ORAL_TABLET | Freq: Two times a day (BID) | ORAL | 0 refills | Status: DC

## 2021-03-16 NOTE — Addendum Note (Signed)
Addended by: Michaela Corner on: 03/16/2021 05:03 PM   Modules accepted: Orders

## 2021-03-16 NOTE — Progress Notes (Signed)
Chief Complaint  Patient presents with  . Cough  . Nasal Congestion  . Nausea    HPI  Patient presents today for Patient presents with upper respiratory congestion. Maxillary pressure noted. Rhinorrhea that is frequently purulent. There is moderate sore throat. Patient reports coughing frequently as well.  Loose but no sputum noted. There is no fever, chills or sweats. The patient denies being short of breath.  Symptoms originally onset around the 20 th of this month.  She got better with the steroid treatment for 5 days given last week.  She finished the steroid and unfortunately her symptoms recurred the very next day days ago. Gradually worsening. Tried OTC's without improvement. Covid and flu tests were negative on 5/16. Current symptoms are similar to the Covid she had last year.  PMH: Smoking status noted ROS: Per HPI  Objective: BP 109/76   Pulse 70   Temp (!) 97.2 F (36.2 C)   SpO2 100%  Gen: NAD, alert, cooperative with exam HEENT: NCAT, Nasal passages swollen, red TMS RED CV: RRR, good S1/S2, no murmur Resp: Bronchitis changes with scattered wheezes, non-labored Ext: No edema, warm Neuro: Alert and oriented, No gross deficits  Assessment and plan:  1. Acute maxillary sinusitis, recurrence not specified     Meds ordered this encounter  Medications  . predniSONE (DELTASONE) 10 MG tablet    Sig: Take 5 daily for 2 days followed by 4,3,2 and 1 for 2 days each.    Dispense:  30 tablet    Refill:  0  . amoxicillin-clavulanate (AUGMENTIN) 875-125 MG tablet    Sig: Take 1 tablet by mouth 2 (two) times daily. Take all of this medication    Dispense:  20 tablet    Refill:  0      Follow up as needed.  Claretta Fraise, MD

## 2021-03-17 LAB — NOVEL CORONAVIRUS, NAA: SARS-CoV-2, NAA: NOT DETECTED

## 2021-03-17 LAB — SARS-COV-2, NAA 2 DAY TAT

## 2021-03-18 ENCOUNTER — Encounter: Payer: Self-pay | Admitting: Family Medicine

## 2021-03-26 ENCOUNTER — Other Ambulatory Visit: Payer: Self-pay | Admitting: Family Medicine

## 2021-03-30 ENCOUNTER — Other Ambulatory Visit: Payer: Self-pay | Admitting: Family Medicine

## 2021-03-30 DIAGNOSIS — Z853 Personal history of malignant neoplasm of breast: Secondary | ICD-10-CM

## 2021-04-02 ENCOUNTER — Other Ambulatory Visit: Payer: Self-pay

## 2021-04-02 ENCOUNTER — Ambulatory Visit
Admission: RE | Admit: 2021-04-02 | Discharge: 2021-04-02 | Disposition: A | Discharge status: Home / Self Care | Payer: 59 | Source: Ambulatory Visit | Attending: Family Medicine | Admitting: Family Medicine

## 2021-04-02 DIAGNOSIS — Z853 Personal history of malignant neoplasm of breast: Secondary | ICD-10-CM

## 2021-04-06 ENCOUNTER — Telehealth: Payer: Self-pay | Admitting: Family Medicine

## 2021-04-06 NOTE — Telephone Encounter (Signed)
Pt called stating that her GI doctor wants her to pick up a stool kit from her PCP office. Pt says she has the paperwork and will bring with her when she comes to pick up stool kit.   Wanted to make Dr Lajuana Ripple aware.

## 2021-04-14 ENCOUNTER — Other Ambulatory Visit: Payer: 59

## 2021-04-14 DIAGNOSIS — M8580 Other specified disorders of bone density and structure, unspecified site: Secondary | ICD-10-CM | POA: Insufficient documentation

## 2021-04-26 ENCOUNTER — Other Ambulatory Visit (HOSPITAL_COMMUNITY): Payer: Self-pay | Admitting: Internal Medicine

## 2021-04-26 ENCOUNTER — Other Ambulatory Visit: Payer: Self-pay | Admitting: Family Medicine

## 2021-04-29 ENCOUNTER — Telehealth: Payer: Self-pay | Admitting: Family Medicine

## 2021-04-29 MED ORDER — CARVEDILOL 3.125 MG PO TABS: 3.1250 mg | ORAL_TABLET | Freq: Two times a day (BID) | ORAL | 1 refills | Status: DC

## 2021-04-29 NOTE — Telephone Encounter (Signed)
  Prescription Request  04/29/2021  What is the name of the medication or equipment? Carvedilol  Have you contacted your pharmacy to request a refill? (if applicable) yes  Which pharmacy would you like this sent to?  [facility]   Patient notified that their request is being sent to the clinical staff for review and that they should receive a response within 2 business days.    Gottschalk's pt.

## 2021-04-29 NOTE — Telephone Encounter (Signed)
Patient's Carvedilol was refilled.  Patient has upcoming appointment with Dr. Lajuana Ripple on 06/22/21.

## 2021-05-10 DIAGNOSIS — N94810 Vulvar vestibulitis: Secondary | ICD-10-CM | POA: Insufficient documentation

## 2021-05-10 DIAGNOSIS — N952 Postmenopausal atrophic vaginitis: Secondary | ICD-10-CM | POA: Insufficient documentation

## 2021-05-10 DIAGNOSIS — E049 Nontoxic goiter, unspecified: Secondary | ICD-10-CM | POA: Insufficient documentation

## 2021-05-10 DIAGNOSIS — I10 Essential (primary) hypertension: Secondary | ICD-10-CM | POA: Insufficient documentation

## 2021-05-10 DIAGNOSIS — Z853 Personal history of malignant neoplasm of breast: Secondary | ICD-10-CM | POA: Insufficient documentation

## 2021-05-13 ENCOUNTER — Ambulatory Visit (INDEPENDENT_AMBULATORY_CARE_PROVIDER_SITE_OTHER): Payer: 59

## 2021-05-13 DIAGNOSIS — I428 Other cardiomyopathies: Secondary | ICD-10-CM

## 2021-05-13 LAB — CUP PACEART REMOTE DEVICE CHECK
Battery Remaining Longevity: 78 mo
Battery Remaining Percentage: 87 %
Brady Statistic RA Percent Paced: 0 %
Brady Statistic RV Percent Paced: 0 %
Date Time Interrogation Session: 20220721105200
HighPow Impedance: 53 Ohm
Implantable Lead Implant Date: 20090320
Implantable Lead Implant Date: 20090320
Implantable Lead Location: 753859
Implantable Lead Location: 753860
Implantable Lead Model: 158
Implantable Lead Model: 5076
Implantable Lead Serial Number: 182504
Implantable Pulse Generator Implant Date: 20160307
Lead Channel Impedance Value: 514 Ohm
Lead Channel Impedance Value: 515 Ohm
Lead Channel Pacing Threshold Amplitude: 0.5 V
Lead Channel Pacing Threshold Amplitude: 1 V
Lead Channel Pacing Threshold Pulse Width: 0.4 ms
Lead Channel Pacing Threshold Pulse Width: 0.4 ms
Lead Channel Setting Pacing Amplitude: 2 V
Lead Channel Setting Pacing Amplitude: 2.5 V
Lead Channel Setting Pacing Pulse Width: 0.4 ms
Lead Channel Setting Sensing Sensitivity: 0.6 mV
Pulse Gen Serial Number: 111826

## 2021-05-19 DIAGNOSIS — G629 Polyneuropathy, unspecified: Secondary | ICD-10-CM | POA: Insufficient documentation

## 2021-06-04 ENCOUNTER — Other Ambulatory Visit: Payer: Self-pay | Admitting: Family Medicine

## 2021-06-04 NOTE — Progress Notes (Signed)
Remote ICD transmission.   

## 2021-06-14 ENCOUNTER — Encounter (HOSPITAL_COMMUNITY): Payer: 59 | Admitting: Internal Medicine

## 2021-06-17 ENCOUNTER — Ambulatory Visit (HOSPITAL_COMMUNITY)
Admission: RE | Admit: 2021-06-17 | Discharge: 2021-06-17 | Disposition: A | Discharge status: Home / Self Care | Payer: 59 | Source: Ambulatory Visit | Attending: Internal Medicine | Admitting: Internal Medicine

## 2021-06-17 ENCOUNTER — Encounter (HOSPITAL_COMMUNITY): Payer: Self-pay | Admitting: Internal Medicine

## 2021-06-17 ENCOUNTER — Other Ambulatory Visit: Payer: Self-pay

## 2021-06-17 VITALS — BP 128/76 | HR 63 | Wt 215.8 lb

## 2021-06-17 DIAGNOSIS — Z79899 Other long term (current) drug therapy: Secondary | ICD-10-CM | POA: Diagnosis not present

## 2021-06-17 DIAGNOSIS — Z95810 Presence of automatic (implantable) cardiac defibrillator: Secondary | ICD-10-CM | POA: Diagnosis not present

## 2021-06-17 DIAGNOSIS — E039 Hypothyroidism, unspecified: Secondary | ICD-10-CM | POA: Diagnosis not present

## 2021-06-17 DIAGNOSIS — I110 Hypertensive heart disease with heart failure: Secondary | ICD-10-CM | POA: Insufficient documentation

## 2021-06-17 DIAGNOSIS — E669 Obesity, unspecified: Secondary | ICD-10-CM | POA: Insufficient documentation

## 2021-06-17 DIAGNOSIS — R0602 Shortness of breath: Secondary | ICD-10-CM | POA: Insufficient documentation

## 2021-06-17 DIAGNOSIS — Z8616 Personal history of COVID-19: Secondary | ICD-10-CM | POA: Diagnosis not present

## 2021-06-17 DIAGNOSIS — I5022 Chronic systolic (congestive) heart failure: Secondary | ICD-10-CM | POA: Insufficient documentation

## 2021-06-17 DIAGNOSIS — Z8249 Family history of ischemic heart disease and other diseases of the circulatory system: Secondary | ICD-10-CM | POA: Diagnosis not present

## 2021-06-17 DIAGNOSIS — I428 Other cardiomyopathies: Secondary | ICD-10-CM | POA: Diagnosis not present

## 2021-06-17 DIAGNOSIS — Z7901 Long term (current) use of anticoagulants: Secondary | ICD-10-CM | POA: Diagnosis not present

## 2021-06-17 DIAGNOSIS — I10 Essential (primary) hypertension: Secondary | ICD-10-CM

## 2021-06-17 LAB — BASIC METABOLIC PANEL
Anion gap: 4 — ABNORMAL LOW (ref 5–15)
BUN: 9 mg/dL (ref 8–23)
CO2: 28 mmol/L (ref 22–32)
Calcium: 9 mg/dL (ref 8.9–10.3)
Chloride: 107 mmol/L (ref 98–111)
Creatinine, Ser: 0.81 mg/dL (ref 0.44–1.00)
GFR, Estimated: >60 mL/min (ref >60)
Glucose, Bld: 83 mg/dL (ref 70–99)
Potassium: 4 mmol/L (ref 3.5–5.1)
Sodium: 139 mmol/L (ref 135–145)

## 2021-06-17 LAB — HEMOGLOBIN A1C
Hgb A1c MFr Bld: 5.9 % — ABNORMAL HIGH (ref 4.8–5.6)
Mean Plasma Glucose: 122.63 mg/dL

## 2021-06-17 LAB — BRAIN NATRIURETIC PEPTIDE: B Natriuretic Peptide: 36.9 pg/mL (ref 0.0–100.0)

## 2021-06-17 NOTE — Progress Notes (Signed)
ADVANCED HF CLINIC NOTE  Primary Cardiologist: Dr. [name] PCP: Dr. Lajuana Ripple  HPI:  [name] [name] is a 60 y.o. female with breast cancer (s/p lumpectomy and XRT - refused chemo), hypothyroidism referred by Dr. [name] for further evaluation of systolic HF due to NICM. EF ~30%   She has long h/o NICM dating back to 2007. Cath in 2017 with normal cors and EF 25-30%. S/p s-ICD. Medical therapy has been limited by multiple medication intolerances (Spironolactone caused body burning sensation, could not afford Bidil and had fatigue with high does of Entresto).   Had COVID PNA in 1/21. Was treated at home. Her mother passed from [place].   Echo 1/21 EF 25-30% RV normal   I saw her for first visit in 2/21. Was feeling well. Entresto increased to 49/51 bid  We got CPX which showed normal functional capacity.   CPX 12/19/19 FVC 2.75 (87%)      FEV1 2.14 (86%)        FEV1/FVC 78 (98%)        MVV 105 (104%)   Resting HR: 85 Standing HR: 86 Peak HR: 161   (100% age predicted max HR)  BP rest: 128/84 Standing BP: 116/82 BP peak: 166/72  Peak VO2: 17.7 (97%% predicted peak VO2)  - adjusted for ibw peak VO2 is 24.7 ml/kg (ibw)/min VE/VCO2 slope:  29  Peak RER: 1.08  O2pulse:  11   (100% predicted O2pulse)   Started on Eplerenone at last visit in April. Referred for sleep study but has not been scheduled.  Undergoing workup for chronic cough.  Last echo 04/22 EF 99991111, RV systolic fxn moderately reduced  She is here today for routine f/u. Feels much better. Has lost 15 pounds through a lifestyle changes program at her [place]. Gets SOB if she has to walks to fast or goes up the hill. When she goes to the store says she "hovers over the buggy".  No edema, orthopnea or PND. Compliant with all meds. Main complaint is burning in legs.    ICD interrogated personally in clinic: No VT/AF. Activity level 1.6 hr Personally reviewed    Past Medical History:  Diagnosis Date   AICD (automatic  cardioverter/defibrillator) present 09   [employer]- replaced 2016   Arthritis    Bradycardia    Breast cancer ([place]) 02/24/2016   s/p lumpectomy and radiation but refused chemotherapy   Chronic systolic CHF (congestive heart failure) (HCC)    Cough due to angiotensin-converting enzyme inhibitor    Dizziness    Dizziness with standing after squatting, March, 2012   Dyslipidemia    LDL elevation,Patient does not want statin   Family history of breast cancer    GERD (gastroesophageal reflux disease)    H/O shortness of breath    CPX 10/18:  No evidence of cardiopulmonary limitation. Suspect exercise intolerance related to weight and deconditioning.    Hypothyroidism    ICD (implantable cardiac defibrillator) battery depletion    Dr. [name], June, 2009(artifactual atrial tachycardia response events addressed by reprogramming in parentheses   Leg swelling    right leg   Mitral regurgitation    Nonischemic cardiomyopathy ([place])    Etiology unknown, diagnosed 2008   Personal history of radiation therapy 2017   Pneumonia due to COVID-19 virus 10/2019   TIA (transient ischemic attack)    No CT or MRI abnormality, aspirin therapy    Current Outpatient Medications  Medication Sig Dispense Refill   acetaminophen (TYLENOL) 500 MG tablet Take 500  mg by mouth every 6 (six) hours as needed for mild pain or moderate pain.     albuterol (VENTOLIN HFA) 108 (90 Base) MCG/ACT inhaler Inhale 2 puffs into the lungs every 6 (six) hours as needed for wheezing or shortness of breath. 8 g 2   carvedilol (COREG) 3.125 MG tablet Take 1 tablet (3.125 mg total) by mouth 2 (two) times daily. (NEEDS TO BE SEEN BEFORE NEXT REFILL) 60 tablet 1   cetirizine (ZYRTEC) 10 MG tablet Take 10 mg by mouth daily.     dicyclomine (BENTYL) 10 MG capsule Take 10 mg by mouth 3 (three) times daily before meals.      ENTRESTO 97-103 MG Take 1 tablet by mouth twice daily 60 tablet 1   eplerenone (INSPRA) 25 MG tablet Take  0.5 tablets (12.5 mg total) by mouth daily. 15 tablet 6   EUTHYROX 75 MCG tablet TAKE 1 TABLET BY MOUTH ONCE DAILY BEFORE BREAKFAST 90 tablet 0   gabapentin (NEURONTIN) 300 MG capsule Take 1 capsule by mouth daily.     rosuvastatin (CRESTOR) 5 MG tablet Take 1 tablet (5 mg total) by mouth daily. 90 tablet 3   No current facility-administered medications for this encounter.    Allergies  Allergen Reactions   Avelox [Moxifloxacin Hcl In Nacl] Shortness Of Breath    Shortness of breath   Clindamycin/Lincomycin Shortness Of Breath and Diarrhea    Breathing/diarrhea     Kiwi Extract Anaphylaxis    Makes pt. Feel like her throat is closing    Bactrim [Sulfamethoxazole-Trimethoprim] Swelling   Levofloxacin Other (See Comments)    JOINT PAIN JOINT PAIN   Simvastatin Other (See Comments)    Joint pain   Spironolactone Other (See Comments)    burning      Social History   Socioeconomic History   Marital status: Widowed    Spouse name: Not on file   Number of children: Not on file   Years of education: Not on file   Highest education level: Not on file  Occupational History   Occupation: Widow  Tobacco Use   Smoking status: Never   Smokeless tobacco: Never  Vaping Use   Vaping Use: Never used  Substance and Sexual Activity   Alcohol use: No   Drug use: No   Sexual activity: Not on file  Other Topics Concern   Not on file  Social History Narrative   Not on file   Social Determinants of Health   Financial Resource Strain: Not on file  Food Insecurity: Not on file  Transportation Needs: Not on file  Physical Activity: Not on file  Stress: Not on file  Social Connections: Not on file  Intimate Partner Violence: Not on file     Family History  Problem Relation Age of Onset   Heart attack Mother    Cancer Mother        breast   Breast cancer Mother 62   Hypertension Sister    Hypertension Brother    Cancer Maternal Aunt        breast   Breast cancer Maternal  Aunt    Cancer Paternal Grandmother        possible cancer, unknown type   Stroke Neg Hx    Diabetes Neg Hx     Vitals:   06/17/21 1432  BP: 128/76  Pulse: 63  SpO2: 98%  Weight: 97.9 kg (215 lb 12.8 oz)      Wt Readings from Last 3 Encounters:  06/17/21 97.9 kg (215 lb 12.8 oz)  02/11/21 104.1 kg (229 lb 9.6 oz)  02/04/21 105.6 kg (232 lb 12.8 oz)   PHYSICAL EXAM: General:  Well appearing. No resp difficulty HEENT: normal Neck: supple. no JVD. Carotids 2+ bilat; no bruits. No lymphadenopathy or thryomegaly appreciated. Cor: PMI nondisplaced. Regular rate & rhythm. No rubs, gallops or murmurs. Lungs: clear Abdomen: soft, nontender, nondistended. No hepatosplenomegaly. No bruits or masses. Good bowel sounds. Extremities: no cyanosis, clubbing, rash, edema Neuro: alert & orientedx3, cranial nerves grossly intact. moves all 4 extremities w/o difficulty. Affect pleasant  ASSESSMENT & PLAN:  1. Chronic systolic HF - longstanding NICM dating back to 2007. Unclear etiology - Cath in 2017 with normal cors and EF 25-30%. S/p s-ICD - Echo 11/20 read as 35-40% (likely 30-35%)  - Echo 1/21 EF 25-30%  RV normal  (post-COVID) - Echo 02/11/21: EF 30-35% RV moderately down Personally reviewed - CPX testing 2/21 with normal functional capacity when corrected for ibw. No obvious HF limitation - NYHA II-III Volume status ok  - GDMT limited by multiple medication intolerances (Spironolactone caused body burning sensation, could not afford Bidil) - Continue Entresto to 97/103 bid. - Continue carvedilol 3.125 bid. Will not increase with HR low 60s.  - Increase eplerenone to 25 mg daily - We discussed Wilder Glade but would like to avoid given her propensity for yeast infections.  - ICD interrogated in clinic today as above. - Labs today  2. HTN - Blood pressure well controlled. Continue current regimen.  3. Snoring - Sleep study 2/22 AHI 2.0 no need for CPAP  4. Obesity  - congratulated  on weight loss.    Glori Bickers, MD  2:43 PM

## 2021-06-17 NOTE — Patient Instructions (Signed)
Labs done today, your results will be available in MyChart, we will contact you for abnormal readings.  Please call our office in January 2023 to schedule your follow up appointment  If you have any questions or concerns before your next appointment please send [place] a message through [place] or call our office at [phone].    TO LEAVE A MESSAGE FOR THE NURSE SELECT OPTION 2, PLEASE LEAVE A MESSAGE INCLUDING: YOUR NAME DATE OF BIRTH CALL BACK NUMBER REASON FOR CALL**this is important as we prioritize the call backs  YOU WILL RECEIVE A CALL BACK THE SAME DAY AS LONG AS YOU CALL BEFORE 4:00 PM  At the [facility], you and your health needs are our priority. As part of our continuing mission to provide you with exceptional heart care, we have created designated Provider Care Teams. These Care Teams include your primary Cardiologist (physician) and Advanced Practice Providers (APPs- Physician Assistants and Nurse Practitioners) who all work together to provide you with the care you need, when you need it.   You may see any of the following providers on your designated Care Team at your next follow up: Dr Glori Bickers Dr Loralie Champagne Dr Patrice Paradise, NP Lyda Jester, [place] Ginnie Smart Audry Riles, PharmD   Please be sure to bring in all your medications bottles to every appointment.

## 2021-06-17 NOTE — Addendum Note (Signed)
Encounter addended by: Scarlette Calico, RN on: 06/17/2021 3:14 PM  Actions taken: Order list changed, Diagnosis association updated, Clinical Note Signed, Charge Capture section accepted

## 2021-06-18 ENCOUNTER — Telehealth (HOSPITAL_COMMUNITY): Payer: Self-pay | Admitting: *Deleted

## 2021-06-18 MED ORDER — EPLERENONE 25 MG PO TABS
25.0000 mg | ORAL_TABLET | Freq: Every day | ORAL | 6 refills | Status: DC
Start: 2021-06-18 — End: 2022-02-02

## 2021-06-18 NOTE — Telephone Encounter (Signed)
Pt called to inquire about med changes, she states Dr [name] had advised her to change her Eplerenone yesterday but it was not on her paperwork. Apologized to pt as this was not communicated to myself however he did mention increasing it to 25 mg in his OV note so advised pt to increase, she is agreeable, new rx sent in

## 2021-06-22 ENCOUNTER — Ambulatory Visit (INDEPENDENT_AMBULATORY_CARE_PROVIDER_SITE_OTHER): Payer: 59 | Admitting: Family Medicine

## 2021-06-22 ENCOUNTER — Other Ambulatory Visit: Payer: Self-pay

## 2021-06-22 ENCOUNTER — Encounter: Payer: Self-pay | Admitting: Family Medicine

## 2021-06-22 VITALS — BP 114/79 | HR 63 | Temp 97.5°F | Ht 68.0 in | Wt 215.8 lb

## 2021-06-22 DIAGNOSIS — G629 Polyneuropathy, unspecified: Secondary | ICD-10-CM

## 2021-06-22 DIAGNOSIS — Z1159 Encounter for screening for other viral diseases: Secondary | ICD-10-CM

## 2021-06-22 DIAGNOSIS — R7303 Prediabetes: Secondary | ICD-10-CM

## 2021-06-22 DIAGNOSIS — E7800 Pure hypercholesterolemia, unspecified: Secondary | ICD-10-CM

## 2021-06-22 DIAGNOSIS — E890 Postprocedural hypothyroidism: Secondary | ICD-10-CM

## 2021-06-22 DIAGNOSIS — I5022 Chronic systolic (congestive) heart failure: Secondary | ICD-10-CM | POA: Diagnosis not present

## 2021-06-22 DIAGNOSIS — D649 Anemia, unspecified: Secondary | ICD-10-CM

## 2021-06-22 MED ORDER — CARVEDILOL 3.125 MG PO TABS: 3.1250 mg | ORAL_TABLET | Freq: Two times a day (BID) | ORAL | 3 refills | Status: DC

## 2021-06-22 MED ORDER — ALPHA-LIPOIC ACID 600 MG PO CAPS: 600.0000 mg | ORAL_CAPSULE | Freq: Every day | ORAL | 3 refills | Status: DC

## 2021-06-22 NOTE — Progress Notes (Signed)
Subjective: [id number] PCP: Janora Norlander, DO [id number] [name] [name] is a 60 y.o. female presenting to clinic today for:  1. Hypothyroidism/CHF Patient reports compliance with her Synthroid.  No change in voice, difficulty swallowing, tremor.  She is had an 18 pound weight loss but she has been working on this through her church.  She continues to be followed by cardiology for heart failure and had a fairly good checkup recently.  She is been continued on all medications.  Needs refills on her carvedilol.  2.  Elevated blood sugar Patient noted to be prediabetic on recent testing by her cardiologist.  She had reported some neuropathic symptoms that have been refractory to gabapentin.  She describes intense itching and burning of the lower extremities as well as bilateral upper extremities.  This occurs intermittently in the upper extremities.  She has used nystatin cream on the lower extremities thinking that perhaps it was a rash.  She has been afraid to shave her legs because she did not want to irritate the skin further.  No alcoholism.   ROS: Per HPI  Allergies  Allergen Reactions   Avelox [Moxifloxacin Hcl In Nacl] Shortness Of Breath    Shortness of breath   Clindamycin/Lincomycin Shortness Of Breath and Diarrhea    Breathing/diarrhea     Kiwi Extract Anaphylaxis    Makes pt. Feel like her throat is closing    Bactrim [Sulfamethoxazole-Trimethoprim] Swelling   Levofloxacin Other (See Comments)    JOINT PAIN JOINT PAIN   Simvastatin Other (See Comments)    Joint pain   Spironolactone Other (See Comments)    burning   Past Medical History:  Diagnosis Date   AICD (automatic cardioverter/defibrillator) present 09   [employer]- replaced 2016   Arthritis    Bradycardia    Breast cancer ([place]) 02/24/2016   s/p lumpectomy and radiation but refused chemotherapy   Chronic systolic CHF (congestive heart failure) (HCC)    Cough due to angiotensin-converting  enzyme inhibitor    Dizziness    Dizziness with standing after squatting, March, 2012   Dyslipidemia    LDL elevation,Patient does not want statin   Family history of breast cancer    GERD (gastroesophageal reflux disease)    H/O shortness of breath    CPX 10/18:  No evidence of cardiopulmonary limitation. Suspect exercise intolerance related to weight and deconditioning.    Hypothyroidism    ICD (implantable cardiac defibrillator) battery depletion    Dr. [name], June, 2009(artifactual atrial tachycardia response events addressed by reprogramming in parentheses   Leg swelling    right leg   Mitral regurgitation    Nonischemic cardiomyopathy ([place])    Etiology unknown, diagnosed 2008   Personal history of radiation therapy 2017   Pneumonia due to COVID-19 virus 10/2019   TIA (transient ischemic attack)    No CT or MRI abnormality, aspirin therapy    Current Outpatient Medications:    acetaminophen (TYLENOL) 500 MG tablet, Take 500 mg by mouth every 6 (six) hours as needed for mild pain or moderate pain., Disp: , Rfl:    albuterol (VENTOLIN HFA) 108 (90 Base) MCG/ACT inhaler, Inhale 2 puffs into the lungs every 6 (six) hours as needed for wheezing or shortness of breath., Disp: 8 g, Rfl: 2   carvedilol (COREG) 3.125 MG tablet, Take 1 tablet (3.125 mg total) by mouth 2 (two) times daily. (NEEDS TO BE SEEN BEFORE NEXT REFILL), Disp: 60 tablet, Rfl: 1   cetirizine (ZYRTEC) 10 MG  tablet, Take 10 mg by mouth daily., Disp: , Rfl:    dicyclomine (BENTYL) 10 MG capsule, Take 10 mg by mouth 3 (three) times daily before meals. , Disp: , Rfl:    ENTRESTO 97-103 MG, Take 1 tablet by mouth twice daily, Disp: 60 tablet, Rfl: 1   eplerenone (INSPRA) 25 MG tablet, Take 1 tablet (25 mg total) by mouth daily., Disp: 30 tablet, Rfl: 6   EUTHYROX 75 MCG tablet, TAKE 1 TABLET BY MOUTH ONCE DAILY BEFORE BREAKFAST, Disp: 90 tablet, Rfl: 0   gabapentin (NEURONTIN) 300 MG capsule, Take 1 capsule by mouth daily.,  Disp: , Rfl:    rosuvastatin (CRESTOR) 5 MG tablet, Take 1 tablet (5 mg total) by mouth daily., Disp: 90 tablet, Rfl: 3 Social History   Socioeconomic History   Marital status: Widowed    Spouse name: Not on file   Number of children: Not on file   Years of education: Not on file   Highest education level: Not on file  Occupational History   Occupation: Widow  Tobacco Use   Smoking status: Never   Smokeless tobacco: Never  Vaping Use   Vaping Use: Never used  Substance and Sexual Activity   Alcohol use: No   Drug use: No   Sexual activity: Not on file  Other Topics Concern   Not on file  Social History Narrative   Not on file   Social Determinants of Health   Financial Resource Strain: Not on file  Food Insecurity: Not on file  Transportation Needs: Not on file  Physical Activity: Not on file  Stress: Not on file  Social Connections: Not on file  Intimate Partner Violence: Not on file   Family History  Problem Relation Age of Onset   Heart attack Mother    Cancer Mother        breast   Breast cancer Mother 70   Hypertension Sister    Hypertension Brother    Cancer Maternal Aunt        breast   Breast cancer Maternal Aunt    Cancer Paternal Grandmother        possible cancer, unknown type   Stroke Neg Hx    Diabetes Neg Hx     Objective: Office vital signs reviewed. BP 114/79   Pulse 63   Temp (!) 97.5 F (36.4 C)   Ht '5\' 8"'$  (1.727 m)   Wt 215 lb 12.8 oz (97.9 kg)   SpO2 97%   BMI 32.81 kg/m   Physical Examination:  General: Awake, alert, well nourished, No acute distress HEENT: Normal; sclera white.  No exophthalmos Cardio: regular rate and rhythm, S1S2 heard, no murmurs appreciated Pulm: clear to auscultation bilaterally, no wheezes, rhonchi or rales; normal work of breathing on room air Skin: No discrete rash noted Neuro: Vibratory sensation intact  Assessment/ Plan: 60 y.o. female   Post-surgical hypothyroidism - Plan: TSH, T4,  Free  Pure hypercholesterolemia - Plan: Lipid Panel  Chronic systolic CHF (congestive heart failure) (HCC)  Pre-diabetes  Anemia, unspecified type - Plan: CBC  Peripheral polyneuropathy - Plan: Vitamin B12, Alpha-Lipoic Acid 600 MG CAPS  Encounter for hepatitis C screening test for low risk patient - Plan: Hepatitis C antibody  Check thyroid panel.  Asymptomatic from thyroid standpoint  Check fasting lipid panel.  Continue statin  CHF under good control.  No evidence of fluid overload  We discussed carb restriction given prediabetes.  No medications needed at this time  Check  CBC given anemia noted on last laboratory draw  Sounds like neuropathic pain.  Discussed use of alpha lipoic acid.  We will check a vitamin B12 level but vibratory sensation was intact today.  Routine screening for hepatitis C placed   No orders of the defined types were placed in this encounter.  No orders of the defined types were placed in this encounter.    Janora Norlander, DO [place] [phone]

## 2021-06-22 NOTE — Patient Instructions (Signed)
Preventing Type 2 Diabetes Mellitus Type 2 diabetes, also called type 2 diabetes mellitus, is a long-term (chronic) disease that affects sugar (glucose) levels in your blood. Normally, a hormone called insulin allows glucose to enter cells in your body. The cells use glucose for energy. With type 2 diabetes, you will have one or both of these problems: Your pancreas does not make enough insulin. Cells in your body do not respond properly to insulin that your body makes (insulin resistance). Insulin resistance or lack of insulin causes extra glucose to build up in the blood instead of going into cells. As a result, high blood glucose (hyperglycemia) develops. That can cause many complications. Being overweight or obese and having an inactive (sedentary) lifestyle can increase your risk for diabetes. Type 2 diabetes can be delayedor prevented by making certain nutrition and lifestyle changes. How can this condition affect me? If you do not take steps to prevent diabetes, your blood glucose levels may keep increasing over time. Too much glucose in your blood for a long time candamage your blood vessels, heart, kidneys, nerves, and eyes. Type 2 diabetes can lead to chronic health problems and complications, such as: Heart disease. Stroke. Blindness. Kidney disease. Depression. Poor circulation in your feet and legs. In severe cases, a foot or leg may need to be surgically removed (amputated). What can increase my risk? You may be more likely to develop type 2 diabetes if you: Have type 2 diabetes in your family. Are overweight or obese. Have a sedentary lifestyle. Have insulin resistance or a history of prediabetes. Have a history of pregnancy-related (gestational) diabetes or polycystic ovary syndrome (PCOS). What actions can I take to prevent this? It can be difficult to recognize signs of type 2 diabetes. Taking action to prevent the disease before you develop symptoms is the best way to avoid  possible damage to your body. Making certain nutrition and lifestyle changesmay prevent or delay the disease and related health problems. Nutrition  Eat healthy meals and snacks regularly. Do not skip meals. Fruit or a handful of nuts is a healthy snack between meals. Drink water throughout the day. Avoid drinks that contain added sugar, such as soda or sweetened tea. Drink enough fluid to keep your urine pale yellow. Follow instructions from your health care provider about eating or drinking restrictions. Limit the amount of food you eat by: Controlling how much you eat at a time (portion size). Checking food labels for the serving sizes of food. Using a kitchen scale to weigh amounts of food. Saut or steam food instead of frying it. Cook with water or broth instead of oils or butter. Limit saturated fat and salt (sodium) in your diet. Have no more than 1 tsp (2,400 mg) of sodium a day. If you have heart disease or high blood pressure, use less than ? tsp (1,500 mg) of sodium a day.  Lifestyle  Lose weight if needed and as told. Your health care provider can determine how much weight loss is best for you and can help you lose weight safely. If you are overweight or obese, you may be told to lose at least 5?7% of your body weight. Manage blood pressure, cholesterol, and stress. Your health care provider will help determine the best treatment for you. Do not use any products that contain nicotine or tobacco, such as cigarettes, e-cigarettes, and chewing tobacco. If you need help quitting, ask your health care provider.  Activity  Do physical activity that makes your heart beat  faster and makes you sweat (moderate intensity). Do this for at least 30 minutes on at least 5 days of the week, or as much as told by your health care provider. Ask your health care provider what activities are safe for you. A mix of activities may be best, such as walking, swimming, cycling, and strength  training. Try to add physical activity into your day. For example: Park your car farther away than usual so that you walk more. Take a walk during your lunch break. Use stairs instead of elevators or escalators. Walk or bike to work instead of driving.  Alcohol use If you drink alcohol: Limit how much you use to: 0?1 drink a day for women who are not pregnant. 0?2 drinks a day for men. Be aware of how much alcohol is in your drink. In the [place] one drink equals one 12 oz bottle of beer (355 mL), one 5 oz glass of wine (148 mL), or one 1 oz glass of hard liquor (44 mL). General information Talk with your health care provider about your risk factors and how you can reduce your risk for diabetes. Have your blood glucose tested regularly, as told by your health care provider. Get screening tests as told by your health care provider. You may have these regularly, especially if you have certain risk factors for type 2 diabetes. Make an appointment with a registered dietitian. This diet and nutrition specialist can help you make a healthy eating plan and help you understand portion sizes and food labels. Where to find support Ask your health care provider to recommend a registered dietitian, a certified diabetes care and education specialist, or a weight loss program. Look for local or online weight loss groups. Join a gym, fitness club, or outdoor activity group, such as a walking club. Where to find more information To learn more about diabetes and diabetes prevention, visit: American Diabetes Association (ADA): www.diabetes.[employer] of Diabetes and Digestive and Kidney Diseases: [employer] To learn more about healthy eating, visit: U.S. Department of Agriculture Scientist, research (physical sciences)): [employer].no Office of Disease Prevention and Health Promotion (ODPHP): [place] Summary You can delay or prevent type 2 diabetes by eating healthy foods, losing weight if needed, and  increasing your physical activity. Talk with your health care provider about your risk factors for type 2 diabetes and how you can reduce your risk. It can be difficult to recognize the signs of type 2 diabetes. The best way to avoid possible damage to your body is to take action to prevent the disease before you develop symptoms. Get screening tests as told by your health care provider. This information is not intended to replace advice given to you by your health care provider. Make sure you discuss any questions you have with your healthcare provider. Document Revised: 09/23/2020 Document Reviewed: 05/06/2020 Elsevier Patient Education  [place].

## 2021-06-23 LAB — CBC
Hematocrit: 34.2 % (ref 34.0–46.6)
Hemoglobin: 11 g/dL — ABNORMAL LOW (ref 11.1–15.9)
MCH: 29.3 pg (ref 26.6–33.0)
MCHC: 32.2 g/dL (ref 31.5–35.7)
MCV: 91 fL (ref 79–97)
Platelets: 205 10*3/uL (ref 150–450)
RBC: 3.75 x10E6/uL — ABNORMAL LOW (ref 3.77–5.28)
RDW: 12.6 % (ref 11.7–15.4)
WBC: 4.2 10*3/uL (ref 3.4–10.8)

## 2021-06-23 LAB — LIPID PANEL
Chol/HDL Ratio: 2.6 ratio (ref 0.0–4.4)
Cholesterol, Total: 157 mg/dL (ref 100–199)
HDL: 60 mg/dL (ref >39)
LDL Chol Calc (NIH): 85 mg/dL (ref 0–99)
Triglycerides: 57 mg/dL (ref 0–149)
VLDL Cholesterol Cal: 12 mg/dL (ref 5–40)

## 2021-06-23 LAB — TSH: TSH: 1.82 u[IU]/mL (ref 0.450–4.500)

## 2021-06-23 LAB — HEPATITIS C ANTIBODY: Hep C Virus Ab: <0.1 s/co ratio (ref 0.0–0.9)

## 2021-06-23 LAB — T4, FREE: Free T4: 1.58 ng/dL (ref 0.82–1.77)

## 2021-06-23 LAB — VITAMIN B12: Vitamin B-12: 483 pg/mL (ref 232–1245)

## 2021-07-07 NOTE — Progress Notes (Signed)
Patient Care Team: Janora Norlander, DO as PCP - General (Family Medicine) [name], MD as PCP - Electrophysiology (Cardiology) Dorothy Spark, MD (Inactive) as PCP - Cardiology (Cardiology) Nicholas Lose, MD as Consulting Physician (Hematology and Oncology) Kyung Rudd, MD as Consulting Physician (Radiation Oncology) [name], MD as Consulting Physician (General Surgery)  DIAGNOSIS:    ICD-10-CM   1. Malignant neoplasm of upper-inner quadrant of left breast in female, estrogen receptor negative ([place])  C50.212    Z17.1       SUMMARY OF ONCOLOGIC HISTORY: Oncology History Overview Note      Breast cancer of upper-inner quadrant of left female breast ([place])  02/24/2016 Mammogram   Left breast mass 11:00 position 4 cm from nipple, 2.4 x 1.6 x 1.9 cm, single axillary lymph node noted, T2 N0 stage IIA clinical stage ([place])   02/24/2016 Initial Diagnosis   Moderate to poorly differentiated IDC, lymph node biopsy negative, ER 0%, PR 0%, HER-2, 2+ by IHC, fish negative, ratio 1.2, Ki-67 59%    04/05/2016 Surgery   Left lumpectomy [name] [name]): IDC grade 3, 2.3 cm, associated high-grade DCIS, margins negative, 0/1 lymph node, T2 N0 stage II a, ER 0%, PR 0%, HER-2 negative ratio 1.38    Chemotherapy   Patient refused adjuvant chemotherapy   05/03/2016 - 06/16/2016 Radiation Therapy   Adjuvant radiation [name] [name]). Left breast boost // 10 Gy with 5 fractions at a dose of 2 Gy/fraction.  Left breast// 50.4 Gy with 28 fractions at a dose of 1.8 Gy/ fraction   10/28/2016 Genetic Testing   Patient has genetic testing done for personal and family history of breast cancer. ATM [id number] VUS found on genetic testing.  The Breast/GYN gene panel offered by [facility] includes sequencing and rearrangement analysis for the following 23 genes:  ATM, BARD1, BRCA1, BRCA2, BRIP1, CDH1, CHEK2, EPCAM, FANCC, MLH1, MSH2, MSH6, MUTYH, NBN, NF1, PALB2, PMS2, POLD1, PTEN, RAD51C, RAD51D, RECQL, and TP53.    Negative genetic testing for the MSH2 inversion analysis ([name]).      CHIEF COMPLIANT: Follow-up of breast cancer  INTERVAL HISTORY: [name] [name] is a 60 y.o. with above-mentioned history of triple negative left breast cancer who underwent a lumpectomy, adjuvant chemotherapy, radiation, and is currently on surveillance. She also has a history of PE and took anticoagulation with Eliquis. She presents to the clinic today for follow-up.  Her major complaint today is stiffness of the left shoulder and underneath the arm.  This is accompanied by some pain and discomfort.  She is also complaining of an itching sensation in her extremities.  Is unclear in origin.   ALLERGIES:  is allergic to avelox [moxifloxacin hcl in nacl], clindamycin/lincomycin, kiwi extract, bactrim [sulfamethoxazole-trimethoprim], levofloxacin, simvastatin, and spironolactone.  MEDICATIONS:  Current Outpatient Medications  Medication Sig Dispense Refill   acetaminophen (TYLENOL) 500 MG tablet Take 500 mg by mouth every 6 (six) hours as needed for mild pain or moderate pain.     albuterol (VENTOLIN HFA) 108 (90 Base) MCG/ACT inhaler Inhale 2 puffs into the lungs every 6 (six) hours as needed for wheezing or shortness of breath. 8 g 2   Alpha-Lipoic Acid 600 MG CAPS Take 1 capsule (600 mg total) by mouth daily. 90 capsule 3   carvedilol (COREG) 3.125 MG tablet Take 1 tablet (3.125 mg total) by mouth 2 (two) times daily. 180 tablet 3   cetirizine (ZYRTEC) 10 MG tablet Take 10 mg by mouth daily.     dicyclomine (  BENTYL) 10 MG capsule Take 10 mg by mouth 3 (three) times daily before meals.      ENTRESTO 97-103 MG Take 1 tablet by mouth twice daily 60 tablet 1   eplerenone (INSPRA) 25 MG tablet Take 1 tablet (25 mg total) by mouth daily. 30 tablet 6   EUTHYROX 75 MCG tablet TAKE 1 TABLET BY MOUTH ONCE DAILY BEFORE BREAKFAST 90 tablet 0   gabapentin (NEURONTIN) 300 MG capsule Take 1 capsule by mouth daily.      rosuvastatin (CRESTOR) 5 MG tablet Take 1 tablet (5 mg total) by mouth daily. 90 tablet 3   No current facility-administered medications for this visit.    PHYSICAL EXAMINATION: ECOG PERFORMANCE STATUS: 1 - Symptomatic but completely ambulatory  Vitals:   07/08/21 1408  BP: 131/66  Pulse: 76  Resp: 18  Temp: (!) 97.5 F (36.4 C)  SpO2: 99%   Filed Weights   07/08/21 1408  Weight: 216 lb 4.8 oz (98.1 kg)      LABORATORY DATA:  I have reviewed the data as listed CMP Latest Ref Rng & Units 06/17/2021 02/19/2021 10/14/2020  Glucose 70 - 99 mg/dL 83 91 88  BUN 8 - 23 mg/dL '9 9 9  ' Creatinine 0.44 - 1.00 mg/dL 0.81 0.85 0.91  Sodium 135 - 145 mmol/L 139 147(H) 142  Potassium 3.5 - 5.1 mmol/L 4.0 4.2 4.0  Chloride 98 - 111 mmol/L 107 107(H) 107  CO2 22 - 32 mmol/L '28 23 26  ' Calcium 8.9 - 10.3 mg/dL 9.0 9.1 9.1  Total Protein 6.0 - 8.5 g/dL - - -  Total Bilirubin 0.0 - 1.2 mg/dL - - -  Alkaline Phos 48 - 121 IU/L - - -  AST 0 - 40 IU/L - - -  ALT 0 - 32 IU/L - - -    Lab Results  Component Value Date   WBC 4.2 06/22/2021   HGB 11.0 (L) 06/22/2021   HCT 34.2 06/22/2021   MCV 91 06/22/2021   PLT 205 06/22/2021   NEUTROABS 2.4 06/05/2020    ASSESSMENT & PLAN:  Breast cancer of upper-inner quadrant of left female breast ([place]) Left lumpectomy 04/05/2016: IDC grade 3, 2.3 cm, associated high-grade DCIS, margins negative, 0/1 lymph node, T2 N0 stage II a, ER 0%, PR 0%, HER-2 negative ratio 1.38 Patient refused adjuvant chemotherapy (patient fully understands that she is at very high risk of recurrence) Received adjuvant radiation from 05/03/2016 to 06/16/2016   Surveillance of breast cancer: 1. Breast exams 07/08/2021: Benign 2. Annual mammograms 04/02/2021: No abnormalities. Density cat B   Back Pain: seeing Ortho Scan: Disc issues L4-5 and arthritis    Hospitalization: 04/21/2020-04/22/2020: PE: Discharged on Eliquis (had COVID-19 pneumonia January 2021 did not require  hospitalization)   Limitation of range of motion of the left hand: I discussed with her about exercises that she could do to decrease the stiffness under the arm. Itching sensation of the skin: Unclear origin  Treatment plan:  Lupus anticoagulant: Negative on 06/30/2020 Recommendation: Anticoagulation for 1 year completed 04/22/2021   Return to clinic in 1 year for follow-up    No orders of the defined types were placed in this encounter.  The patient has a good understanding of the overall plan. she agrees with it. she will call with any problems that may develop before the next visit here.  Total time spent: 20 mins including face to face time and time spent for planning, charting and coordination of care  [name] [name]  Loyal Gambler, MD, MPH 07/08/2021  I, [name] [name], am acting as scribe for Dr. Nicholas Lose.  I have reviewed the above documentation for accuracy and completeness, and I agree with the above.

## 2021-07-08 ENCOUNTER — Other Ambulatory Visit: Payer: Self-pay

## 2021-07-08 ENCOUNTER — Inpatient Hospital Stay: Payer: 59 | Attending: Hematology and Oncology | Admitting: Hematology and Oncology

## 2021-07-08 DIAGNOSIS — Z171 Estrogen receptor negative status [ER-]: Secondary | ICD-10-CM | POA: Diagnosis not present

## 2021-07-08 DIAGNOSIS — Z79899 Other long term (current) drug therapy: Secondary | ICD-10-CM | POA: Insufficient documentation

## 2021-07-08 DIAGNOSIS — Z86711 Personal history of pulmonary embolism: Secondary | ICD-10-CM | POA: Insufficient documentation

## 2021-07-08 DIAGNOSIS — C50212 Malignant neoplasm of upper-inner quadrant of left female breast: Secondary | ICD-10-CM | POA: Insufficient documentation

## 2021-07-08 NOTE — Assessment & Plan Note (Signed)
Left lumpectomy 04/05/2016: IDC grade 3, 2.3 cm, associated high-grade DCIS, margins negative, 0/1 lymph node, T2 N0 stage II a, ER 0%, PR 0%, HER-2 negative ratio 1.38 Patient refused adjuvant chemotherapy (patient fully understands that she is at very high risk of recurrence) Received adjuvant radiation from 05/03/2016 to 06/16/2016  Surveillance of breast cancer: 1. Breast exams9/15/2022: Benign 2. Annual mammograms6/07/2021: No abnormalities. Density catB  Back Pain: seeing Ortho Scan: Disc issues L4-5 and arthritis  Hospitalization: 04/21/2020-04/22/2020: PE: Discharged on Eliquis (had COVID-19 pneumonia January 2021 did not require hospitalization)  Treatment plan: Lupus anticoagulant: Negative on 06/30/2020 Recommendation: Anticoagulation for 1 year completed 04/22/2021  Return to clinic in 1 year for follow-up

## 2021-07-09 ENCOUNTER — Other Ambulatory Visit (HOSPITAL_COMMUNITY): Payer: Self-pay | Admitting: Internal Medicine

## 2021-08-02 ENCOUNTER — Encounter: Payer: Self-pay | Admitting: Hematology and Oncology

## 2021-08-12 ENCOUNTER — Ambulatory Visit (INDEPENDENT_AMBULATORY_CARE_PROVIDER_SITE_OTHER): Payer: BC Managed Care – PPO

## 2021-08-12 DIAGNOSIS — I428 Other cardiomyopathies: Secondary | ICD-10-CM

## 2021-08-13 ENCOUNTER — Other Ambulatory Visit: Payer: Self-pay | Admitting: Family Medicine

## 2021-08-15 LAB — CUP PACEART REMOTE DEVICE CHECK
Battery Remaining Longevity: 72 mo
Battery Remaining Percentage: 84 %
Brady Statistic RA Percent Paced: 0 %
Brady Statistic RV Percent Paced: 0 %
Date Time Interrogation Session: 20221020172100
HighPow Impedance: 51 Ohm
Implantable Lead Implant Date: 20090320
Implantable Lead Implant Date: 20090320
Implantable Lead Location: 753859
Implantable Lead Location: 753860
Implantable Lead Model: 158
Implantable Lead Model: 5076
Implantable Lead Serial Number: 182504
Implantable Pulse Generator Implant Date: 20160307
Lead Channel Impedance Value: 510 Ohm
Lead Channel Impedance Value: 528 Ohm
Lead Channel Pacing Threshold Amplitude: 0.5 V
Lead Channel Pacing Threshold Amplitude: 1 V
Lead Channel Pacing Threshold Pulse Width: 0.4 ms
Lead Channel Pacing Threshold Pulse Width: 0.4 ms
Lead Channel Setting Pacing Amplitude: 2 V
Lead Channel Setting Pacing Amplitude: 2.5 V
Lead Channel Setting Pacing Pulse Width: 0.4 ms
Lead Channel Setting Sensing Sensitivity: 0.6 mV
Pulse Gen Serial Number: 111826

## 2021-08-20 NOTE — Progress Notes (Signed)
Remote ICD transmission.   

## 2021-08-24 ENCOUNTER — Encounter (HOSPITAL_COMMUNITY): Payer: Self-pay

## 2021-08-24 ENCOUNTER — Emergency Department (HOSPITAL_COMMUNITY)
Admission: EM | Admit: 2021-08-24 | Discharge: 2021-08-24 | Disposition: A | Discharge status: Home / Self Care | Payer: 59 | Attending: Emergency Medicine | Admitting: Emergency Medicine

## 2021-08-24 ENCOUNTER — Telehealth: Payer: Self-pay | Admitting: Internal Medicine

## 2021-08-24 ENCOUNTER — Emergency Department (HOSPITAL_COMMUNITY): Payer: 59

## 2021-08-24 DIAGNOSIS — Z8616 Personal history of COVID-19: Secondary | ICD-10-CM | POA: Insufficient documentation

## 2021-08-24 DIAGNOSIS — Z79899 Other long term (current) drug therapy: Secondary | ICD-10-CM | POA: Insufficient documentation

## 2021-08-24 DIAGNOSIS — R29810 Facial weakness: Secondary | ICD-10-CM | POA: Diagnosis not present

## 2021-08-24 DIAGNOSIS — E039 Hypothyroidism, unspecified: Secondary | ICD-10-CM | POA: Diagnosis not present

## 2021-08-24 DIAGNOSIS — I5022 Chronic systolic (congestive) heart failure: Secondary | ICD-10-CM | POA: Diagnosis not present

## 2021-08-24 DIAGNOSIS — Z853 Personal history of malignant neoplasm of breast: Secondary | ICD-10-CM | POA: Insufficient documentation

## 2021-08-24 LAB — URINALYSIS, ROUTINE W REFLEX MICROSCOPIC
Bacteria, UA: NONE SEEN
Bilirubin Urine: NEGATIVE
Glucose, UA: NEGATIVE mg/dL
Ketones, ur: NEGATIVE mg/dL
Leukocytes,Ua: NEGATIVE
Nitrite: NEGATIVE
Protein, ur: NEGATIVE mg/dL
Specific Gravity, Urine: 1.01 (ref 1.005–1.030)
pH: 5 (ref 5.0–8.0)

## 2021-08-24 LAB — PROTIME-INR
INR: 1 (ref 0.8–1.2)
Prothrombin Time: 12.9 seconds (ref 11.4–15.2)

## 2021-08-24 LAB — CBC WITH DIFFERENTIAL/PLATELET
Abs Immature Granulocytes: 0 10*3/uL (ref 0.00–0.07)
Basophils Absolute: 0 10*3/uL (ref 0.0–0.1)
Basophils Relative: 1 %
Eosinophils Absolute: 0.2 10*3/uL (ref 0.0–0.5)
Eosinophils Relative: 6 %
HCT: 36.6 % (ref 36.0–46.0)
Hemoglobin: 11.8 g/dL — ABNORMAL LOW (ref 12.0–15.0)
Immature Granulocytes: 0 %
Lymphocytes Relative: 40 %
Lymphs Abs: 1.5 10*3/uL (ref 0.7–4.0)
MCH: 30.6 pg (ref 26.0–34.0)
MCHC: 32.2 g/dL (ref 30.0–36.0)
MCV: 95.1 fL (ref 80.0–100.0)
Monocytes Absolute: 0.3 10*3/uL (ref 0.1–1.0)
Monocytes Relative: 8 %
Neutro Abs: 1.8 10*3/uL (ref 1.7–7.7)
Neutrophils Relative %: 45 %
Platelets: 204 10*3/uL (ref 150–400)
RBC: 3.85 MIL/uL — ABNORMAL LOW (ref 3.87–5.11)
RDW: 12.4 % (ref 11.5–15.5)
WBC: 3.8 10*3/uL — ABNORMAL LOW (ref 4.0–10.5)
nRBC: 0 % (ref 0.0–0.2)

## 2021-08-24 LAB — BASIC METABOLIC PANEL
Anion gap: 6 (ref 5–15)
BUN: 10 mg/dL (ref 8–23)
CO2: 27 mmol/L (ref 22–32)
Calcium: 9.3 mg/dL (ref 8.9–10.3)
Chloride: 107 mmol/L (ref 98–111)
Creatinine, Ser: 0.82 mg/dL (ref 0.44–1.00)
GFR, Estimated: >60 mL/min (ref >60)
Glucose, Bld: 90 mg/dL (ref 70–99)
Potassium: 4.2 mmol/L (ref 3.5–5.1)
Sodium: 140 mmol/L (ref 135–145)

## 2021-08-24 LAB — CBG MONITORING, ED: Glucose-Capillary: 74 mg/dL (ref 70–99)

## 2021-08-24 LAB — RAPID URINE DRUG SCREEN, HOSP PERFORMED
Amphetamines: NOT DETECTED
Barbiturates: NOT DETECTED
Benzodiazepines: NOT DETECTED
Cocaine: NOT DETECTED
Opiates: NOT DETECTED
Tetrahydrocannabinol: NOT DETECTED

## 2021-08-24 NOTE — ED Triage Notes (Signed)
Pt. States they have had left sided facial droop since Friday. Pt. Has a history of TIA.

## 2021-08-24 NOTE — Telephone Encounter (Signed)
Pt sent in a my chart message that stated her left cheek was "dropping a little"  I called her and she said that she notice the change last Friday.  She stated her cheek was a little puffy and a little droopy.  Pt denies any other symptoms.  No Chest pain , not SOB nothing.    [phone]- Best number

## 2021-08-24 NOTE — Telephone Encounter (Signed)
Pt sent a mychart message to the schedulers about complaints of "drooping to left side of face."  Called the pt to inquire more information about message sent.  Pt states last Friday she noticed dropping to the left side of her face.  Pt states when she looks in the mirror now, the dropping to the left side of her face is more noticeable than it appeared on last Friday.  Pt complains of no other weakness to the rest of her body.  She denies blurred vision, headache, dizziness, slurred speech.  She voiced no cardiac complaints like chest pain, doe, sob, palpitations, pre-syncopal or syncopal episodes.  Pt states she is able to walk without any difficulty.  She denies N/V or diaphoresis.  Pt mentioned she has had a TIA in the past.   Advised the pt that she needs to report to the ER at this time.  Highly encouraged her to call EMS.  Pt states she will have her best friend drive her now to the ER at [facility], for that is close to her current location. She states she will not drive and is not wanting to go via EMS at this time. Advised the pt to head there now, so that they can work her up for possible TIA/CVA, and/or any other causes. Advised the pt if she has a change in condition in route to the ER, being she is not calling EMS at this time, she should call 911 immediately. Pt verbalized understanding and agrees with this plan. Will send this information as an FYI, to both the pts Gen Cards and EP Provider, Dr. [name].

## 2021-08-24 NOTE — ED Provider Notes (Signed)
[facility] Provider Note   CSN: [id number] Arrival date & time: 08/24/21  1152     History Chief Complaint  Patient presents with   Facial Droop    [name] is a 60 y.o. female.  HPI  Patient with significant medical history including chronic CHF EF of 30%, AICD, bradycardia, dyslipidemia TIA, PE presents with chief complaint of left-sided facial droop.  Patient states she started noticed that her face was drooping on the left side started on Friday, she states that it has remained unchanged, she denies any associated paresthesia or weakness in her face and/or her upper or lower extremities.  She does note that she did have these intermittent headaches, states that they remain on the left side of her face will last approxi-1 minute and then go away on her own, she has no associated change in vision, no neck pain, denies recent falls, is not on anticoagulant, she denies any history of IV drug use, she denies fevers, chills, chest pain, shortness of breath, peripheral edema.  Patient endorses that she has history of TIAs in the past, denies smoking history, states her blood pressures been well uncontrolled, states that she has been on her statins and her lipidemia has also been uncontrolled.  She is no other complaints at this time.  Patient denies alleviating or aggravating factors. Past Medical History:  Diagnosis Date   AICD (automatic cardioverter/defibrillator) present 09   [employer]- replaced 2016   Arthritis    Bradycardia    Breast cancer ([place]) 02/24/2016   s/p lumpectomy and radiation but refused chemotherapy   Chronic systolic CHF (congestive heart failure) (HCC)    Cough due to angiotensin-converting enzyme inhibitor    Dizziness    Dizziness with standing after squatting, March, 2012   Dyslipidemia    LDL elevation,Patient does not want statin   Family history of breast cancer    GERD (gastroesophageal reflux disease)    H/O shortness of  breath    CPX 10/18:  No evidence of cardiopulmonary limitation. Suspect exercise intolerance related to weight and deconditioning.    Hypothyroidism    ICD (implantable cardiac defibrillator) battery depletion    Dr. [name], June, 2009(artifactual atrial tachycardia response events addressed by reprogramming in parentheses   Leg swelling    right leg   Mitral regurgitation    Nonischemic cardiomyopathy ([place])    Etiology unknown, diagnosed 2008   Personal history of radiation therapy 2017   Pneumonia due to COVID-19 virus 10/2019   TIA (transient ischemic attack)    No CT or MRI abnormality, aspirin therapy    Patient Active Problem List   Diagnosis Date Noted   NICM (nonischemic cardiomyopathy) ([place]) 07/23/2020   Pulmonary embolism ([place]) 04/21/2020   Post-surgical hypothyroidism 03/27/2020   Lumbar arthropathy 07/15/2019   Anemia 07/15/2019   History of colonic polyps 05/07/2019   Genetic testing 11/22/2016   Family history of breast cancer    Breast cancer- radiation June 2016 08/05/2016   Abnormal stress test 08/01/2016   Cardiac LV ejection fraction 30-35% 07/18/2016   Arm numbness 03/14/2016   Facial numbness 03/14/2016   Breast cancer of upper-inner quadrant of left female breast ([place]) 62/13/0865   Chronic systolic CHF (congestive heart failure) (HCC) 02/26/2014   Ejection fraction < 50%    Itching    Dizziness    Bradycardia    GERD (gastroesophageal reflux disease)    Hypothyroidism    Hyperlipidemia    Leg swelling  Cough due to angiotensin-converting enzyme inhibitor    Dual implantable cardioverter-defibrillator in situ    FOOT PAIN 01/20/2010   History of TIA 2007 01/19/2010    Past Surgical History:  Procedure Laterality Date   BREAST BIOPSY Left 02/24/2016   U/S Core   BREAST BIOPSY Left 02/24/2016   U/S Core   BREAST LUMPECTOMY Left 04/05/2016   invasive ductal    BREAST LUMPECTOMY WITH RADIOACTIVE SEED AND SENTINEL LYMPH NODE BIOPSY Left  04/05/2016   Procedure: BREAST LUMPECTOMY WITH RADIOACTIVE SEED AND SENTINEL LYMPH NODE BIOPSY;  Surgeon: [name], MD;  Location: [facility];  Service: General;  Laterality: Left;   CARDIAC CATHETERIZATION N/A 08/05/2016   Procedure: Left Heart Cath and Coronary Angiography;  Surgeon: Peter M Martinique, MD;  Location: [facility];  Service: Cardiovascular;  Laterality: N/A;   CARDIAC DEFIBRILLATOR PLACEMENT  09   [employer] no remote   COLONOSCOPY     CYST EXCISION Left    back of leg-lt    IMPLANTABLE CARDIOVERTER DEFIBRILLATOR GENERATOR CHANGE N/A 12/29/2014   Procedure: IMPLANTABLE CARDIOVERTER DEFIBRILLATOR GENERATOR CHANGE;  Surgeon: [name], MD;  Location[facility] CATH [facility];  Service: Cardiovascular;  Laterality: N/A;   LATERAL EPICONDYLE RELEASE  10/11/2012   Procedure: TENNIS ELBOW RELEASE;  Surgeon: [name], MD;  Location: [facility];  Service: Orthopedics;  Laterality: Right;  RIGHT ELBOW: TENOTOMY ELBOW LATERAL EPICONDYLITIS TENNIS ELBOW: RADIAL TUNNEL RELEASE   PORT-A-CATH REMOVAL  04/13/2016   Procedure: MINOR REMOVAL PORT-A-CATH;  Surgeon: [name], MD;  Location: [facility];  Service: General;;   PORTACATH PLACEMENT Right 04/05/2016   Procedure: INSERTION PORT-A-CATH WITH [place];  Surgeon: [name], MD;  Location: [facility];  Service: General;  Laterality: Right;  right IJ   TOTAL THYROIDECTOMY  2001   TUBAL LIGATION  94   UPPER GI ENDOSCOPY       OB History   No obstetric history on file.     Family History  Problem Relation Age of Onset   Heart attack Mother    Cancer Mother        breast   Breast cancer Mother 74   Hypertension Sister    Hypertension Brother    Cancer Maternal Aunt        breast   Breast cancer Maternal Aunt    Cancer Paternal Grandmother        possible cancer, unknown type   Stroke Neg Hx    Diabetes Neg Hx     Social History   Tobacco Use   Smoking status: Never   Smokeless tobacco:  Never  Vaping Use   Vaping Use: Never used  Substance Use Topics   Alcohol use: No   Drug use: No    Home Medications Prior to Admission medications   Medication Sig Start Date End Date Taking? Authorizing Provider  acetaminophen (TYLENOL) 500 MG tablet Take 500 mg by mouth every 6 (six) hours as needed for mild pain or moderate pain.   Yes [provider]  albuterol (VENTOLIN HFA) 108 (90 Base) MCG/ACT inhaler Inhale 2 puffs into the lungs every 6 (six) hours as needed for wheezing or shortness of breath. 08/03/20  Yes Hawks, Christy A, FNP  Alpha-Lipoic Acid 600 MG CAPS Take 1 capsule (600 mg total) by mouth daily. 06/22/21  Yes Gottschalk, Leatrice Jewels M, DO  carvedilol (COREG) 3.125 MG tablet Take 1 tablet (3.125 mg total) by mouth 2 (two) times daily.  06/22/21  Yes Ronnie Doss M, DO  dicyclomine (BENTYL) 10 MG capsule Take 10 mg by mouth 3 (three) times daily before meals.    Yes [provider]  ENTRESTO 97-103 MG Take 1 tablet by mouth twice daily 07/09/21  Yes Bensimhon, Shaune Pascal, MD  eplerenone (INSPRA) 25 MG tablet Take 1 tablet (25 mg total) by mouth daily. 06/18/21  Yes Bensimhon, Shaune Pascal, MD  gabapentin (NEURONTIN) 300 MG capsule Take 1 capsule by mouth daily. 01/23/21  Yes [provider]  levothyroxine (SYNTHROID) 75 MCG tablet TAKE 1 TABLET BY MOUTH ONCE DAILY BEFORE BREAKFAST 08/13/21  Yes Gottschalk, Ashly M, DO  omeprazole (PRILOSEC) 40 MG capsule Take 40 mg by mouth daily.   Yes [provider]  rosuvastatin (CRESTOR) 5 MG tablet Take 1 tablet (5 mg total) by mouth daily. 02/05/21  Yes Freada Bergeron, MD    Allergies    Avelox [moxifloxacin hcl in nacl], Clindamycin/lincomycin, Kiwi extract, Bactrim [sulfamethoxazole-trimethoprim], Levofloxacin, Simvastatin, and Spironolactone  Review of Systems   Review of Systems  Constitutional:  Negative for chills and fever.  HENT:  Negative for congestion.        Left facial droop.   Respiratory:  Negative for shortness of breath.   Cardiovascular:  Negative for chest pain.  Gastrointestinal:  Negative for abdominal pain.  Genitourinary:  Negative for enuresis.  Musculoskeletal:  Negative for back pain.  Skin:  Negative for rash.  Neurological:  Negative for dizziness.  Hematological:  Does not bruise/bleed easily.   Physical Exam Updated Vital Signs BP 98/60   Pulse (!) 55   Temp 98.3 F (36.8 C) (Oral)   Resp 17   Ht 5\' 8"  (1.727 m)   Wt 90.3 kg   SpO2 100%   BMI 30.26 kg/m   Physical Exam Vitals and nursing note reviewed.  Constitutional:      General: She is not in acute distress.    Appearance: She is not ill-appearing.  HENT:     Head: Normocephalic and atraumatic.     Nose: No congestion.     Mouth/Throat:     Mouth: Mucous membranes are moist.     Pharynx: Oropharynx is clear.  Eyes:     Extraocular Movements: Extraocular movements intact.     Conjunctiva/sclera: Conjunctivae normal.     Pupils: Pupils are equal, round, and reactive to light.  Cardiovascular:     Rate and Rhythm: Normal rate and regular rhythm.     Pulses: Normal pulses.     Heart sounds: No murmur heard.   No friction rub. No gallop.  Pulmonary:     Effort: No respiratory distress.     Breath sounds: No wheezing, rhonchi or rales.  Abdominal:     Palpations: Abdomen is soft.     Tenderness: There is no abdominal tenderness. There is no right CVA tenderness or left CVA tenderness.  Musculoskeletal:     Cervical back: Neck supple.     Comments: Spine was palpated was nontender to palpation, patient is moving all 4 extremities without difficulty, neurovascularly intact, 5 of 5 strength.  Skin:    General: Skin is warm and dry.  Neurological:     Mental Status: She is alert.     GCS: GCS eye subscore is 4. GCS verbal subscore is 5. GCS motor subscore is 6.     Cranial Nerves: Cranial nerves 2-12 are intact. No cranial nerve deficit or facial asymmetry.     Sensory:  Sensation  is intact. No sensory deficit.     Motor: No weakness.     Coordination: Romberg sign negative. Finger-Nose-Finger Test normal.     Gait: Gait is intact.     Comments: Cranial nerves II through XII are grossly intact, no difficult word finding, no slurring of her words, able to follow two-step commands, no unilateral weakness present, sensation fully intact, no gait disturbance present.  Psychiatric:        Mood and Affect: Mood normal.    ED Results / Procedures / Treatments   Labs (all labs ordered are listed, but only abnormal results are displayed) Labs Reviewed  CBC WITH DIFFERENTIAL/PLATELET - Abnormal; Notable for the following components:      Result Value   WBC 3.8 (*)    RBC 3.85 (*)    Hemoglobin 11.8 (*)    All other components within normal limits  URINALYSIS, ROUTINE W REFLEX MICROSCOPIC - Abnormal; Notable for the following components:   Hgb urine dipstick SMALL (*)    All other components within normal limits  BASIC METABOLIC PANEL  PROTIME-INR  RAPID URINE DRUG SCREEN, HOSP PERFORMED  CBG MONITORING, ED    EKG EKG Interpretation  Date/Time:  Tuesday August 24 2021 14:09:54 EDT Ventricular Rate:  56 PR Interval:    QRS Duration: 115 QT Interval:  423 QTC Calculation: 409 R Axis:   27 Text Interpretation: Junctional rhythm Nonspecific intraventricular conduction delay Low voltage, precordial leads Borderline T abnormalities, anterior leads Confirmed by Fredia Sorrow [phone]) on 08/24/2021 2:16:30 PM  Radiology CT Head Wo Contrast  Result Date: 08/24/2021 CLINICAL DATA:  Transient ischemic attack (TIA) EXAM: CT HEAD WITHOUT CONTRAST TECHNIQUE: Contiguous axial images were obtained from the base of the skull through the vertex without intravenous contrast. COMPARISON:  September 19, 2006. FINDINGS: Brain: No evidence of acute large vascular territory infarction, hemorrhage, or hydrocephalus. 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 No significant mass effect. Mild patchy white matter hypodensities, nonspecific but compatible with chronic microvascular ischemic disease. Possible thinned or deficient right cribriform plate with questionable herniation of brain through the defect into the posterior right ethmoid air cells, poorly evaluated on this study. Vascular: No hyperdense vessel identified. Skull: No acute fracture. 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 Otherwise, mild to moderate mucosal thickening of the visualized ethmoid air cells. Opacified superior maxillary sinus, partially imaged. Other: No mastoid effusions. IMPRESSION: 1. Possible thinned or deficient right cribriform plate with questionable herniation of brain through the defect into the posterior right ethmoid air cells, poorly evaluated on this study. 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 2. No evidence of acute large vascular territory infarct or acute hemorrhage. Findings and recommendations discussed with [place], [place] via telephone at 2:47 p.m. Electronically Signed   By: Margaretha Sheffield M.D.   On: 08/24/2021 14:53    Procedures Procedures   Medications Ordered in ED Medications - No data to display  ED Course  I have reviewed the triage vital signs and the nursing notes.  Pertinent labs & imaging results that were available during my care of the patient were reviewed by me and considered in my medical decision making (see chart for details).    MDM Rules/Calculators/A&P  Initial impression-patient presents with concerns of left-sided facial drooping.  She is alert, does not appear acute stress, vital signs reassuring.  Unclear etiology no obvious focal deficits, will  obtain basic lab work-up CT head and reassess.  Work-up-CBC shows slight leukocytopenia with a white count of 3.8, normocytic anemia hemoglobin 11.8 appears to be at baseline, BMP is unremarkable, prothrombin time INR unremarkable, CBG unremarkable, UA unremarkable, urine rapid drug screen unremarkable.  CT head reveals possible thickened or deficit right cribriform plates with questionable herniation of the brain through the defect into the posterior right ethmoid air cell poorly evaluated on the study recommend MRI brain.  No evidence of large vessel vascular territory infarction or acute hemorrhage.  Reassessment-updated patient on findings and confirm that she is having no right-sided facial pain, no recent traumas, denies any new nosebleeding or runny noses.  Will consult with neurosurgery for further recommendations.  Patient was updated on these recommendation she is agreement this plan and is ready for discharge.  Consult-spoke with Dr. Glenford Peers neurosurgery he feels this patient can be follow-up on outpatient setting, as there is no active runny nose and or significant symptoms she can discharge home and follow-up with her office.  Does recommend strict return precautions.  Rule out-I have low suspicion for intracranial head bleed and or mass as CT imaging are negative for these findings.  Low suspicion for CVA she has no focal deficits present my exam.  I have low suspicion for dissection or aneurysm of the vertebral/carotid artery as presentation atypical etiology.  Low suspicion for TIA as presentation is is extremely atypical as I see no facial droop, nor does  family member who was present does not see facial droop, no unilateral weakness or paresthesias present, she also has a ABCD squared score of 3 making her very low risk.  low suspicion for brain herniation as she has no runny nose, she has no facial pain, likely this is more of a acute on chronic issue.  Most vision for Bell palsy is  again I do not know any sort of facial asymmetry presentation is atypical of etiology.  Defer antiviral treatment at this time.  Plan-  Facial droop- follow-up with neurology for further evaluation and/or PCP. Possible brain herniation follow-up with neurosurgery for further evaluation, gave her strict return precautions.  Vital signs have remained stable, no indication for hospital admission.  Patient discussed with attending and they agreed with assessment and plan.  Patient given at home care as well strict return precautions.  Patient verbalized that they understood agreed to said plan.  Final Clinical Impression(s) / ED Diagnoses Final diagnoses:  Facial droop    Rx / DC Orders ED Discharge Orders     None        Marcello Fennel, PA-C 08/24/21 1641    Fredia Sorrow, MD 08/25/21 [phone]

## 2021-08-24 NOTE — Discharge Instructions (Addendum)
Patient has a had a defect in your right cribriform bone, I would like you to follow-up with neurosurgery for further evaluation given contact information above please call to schedule a follow-up appointment.  Have also given you the read of the CT scan.  Left-sided facial droop please follow-up with your PCP and/or neurology for further evaluation.   Come back to the emergency department if you develop chest pain, shortness of breath, severe abdominal pain, uncontrolled nausea, vomiting, diarrhea, uncontrolled runny nose severe headaches

## 2021-08-24 NOTE — Telephone Encounter (Deleted)
Pt sent a mychart message to the schedulers about complaints of "drooping to left side of face."  Called the pt to inquire more information about message sent.  Pt states last Friday she noticed dropping to the left side of her face.  Pt states when she looks in the mirror now, the dropping to the left side of her face is more noticeable than it appeared on last Friday.  Pt complains of no other weakness to the rest of her body.  She denies blurred vision, headache, dizziness, slurred speech.  She voiced no cardiac complaints like chest pain, doe, sob, palpitations, pre-syncopal or syncopal episodes.  Pt states she is able to walk without any difficulty.  She denies N/V or diaphoresis.  Pt mentioned she has had a TIA in the past.   Advised the pt that she needs to report to the ER at this time.  Highly encouraged her to call EMS.  Pt states she will have her best friend drive her now to the ER at [facility], for that is close to her current location. She states she will not drive and is not wanting to go via EMS at this time. Advised the pt to head there now, so that they can work her up for possible TIA/CVA, and/or any other causes. Advised the pt if she has a change in condition in route to the ER, via she is not calling EMS at this time, she should call 911 immediately. Pt verbalized understanding and agrees with this plan. Will send this information as an FYI, to both the pts Gen Cards and EP Provider, Dr. [name].

## 2021-08-25 NOTE — Telephone Encounter (Signed)
Pt was seen at [facility] ER yesterday, as advised for complaints of facial drooping.  Below is ER's assessment and plan for the pt.  She was discharged yesterday with recommendations to follow-up with Neurosurgery/Neurology.      Initial impression-patient presents with concerns of left-sided facial drooping.  She is alert, does not appear acute stress, vital signs reassuring.  Unclear etiology no obvious focal deficits, will obtain basic lab work-up CT head and reassess.   Work-up-CBC shows slight leukocytopenia with a white count of 3.8, normocytic anemia hemoglobin 11.8 appears to be at baseline, BMP is unremarkable, prothrombin time INR unremarkable, CBG unremarkable, UA unremarkable, urine rapid drug screen unremarkable.  CT head reveals possible thickened or deficit right cribriform plates with questionable herniation of the brain through the defect into the posterior right ethmoid air cell poorly evaluated on the study recommend MRI brain.  No evidence of large vessel vascular territory infarction or acute hemorrhage.   Reassessment-updated patient on findings and confirm that she is having no right-sided facial pain, no recent traumas, denies any new nosebleeding or runny noses.  Will consult with neurosurgery for further recommendations.   Patient was updated on these recommendation she is agreement this plan and is ready for discharge.   Consult-spoke with Dr. Glenford Peers neurosurgery he feels this patient can be follow-up on outpatient setting, as there is no active runny nose and or significant symptoms she can discharge home and follow-up with her office.  Does recommend strict return precautions.   Rule out-I have low suspicion for intracranial head bleed and or mass as CT imaging are negative for these findings.  Low suspicion for CVA she has no focal deficits present my exam.  I have low suspicion for dissection or aneurysm of the vertebral/carotid artery as presentation atypical etiology.  Low  suspicion for TIA as presentation is is extremely atypical as I see no facial droop, nor does  family member who was present does not see facial droop, no unilateral weakness or paresthesias present, she also has a ABCD squared score of 3 making her very low risk.  low suspicion for brain herniation as she has no runny nose, she has no facial pain, likely this is more of a acute on chronic issue.  Most vision for Bell palsy is again I do not know any sort of facial asymmetry presentation is atypical of etiology.  Defer antiviral treatment at this time.   Plan-   Facial droop- follow-up with neurology for further evaluation and/or PCP. Possible brain herniation follow-up with neurosurgery for further evaluation, gave her strict return precautions.   Vital signs have remained stable, no indication for hospital admission.  Patient discussed with attending and they agreed with assessment and plan.  Patient given at home care as well strict return precautions.  Patient verbalized that they understood agreed to said plan.

## 2021-08-27 NOTE — Telephone Encounter (Signed)
Harleyquinn, Gasser M - 08/24/2021 10:20 AM [name], MD [phone])  Sent: Fri August 27, 2021  2:08 PM  To: [name], LPN          Message  Reviewed notes  Thanks SK

## 2021-09-09 ENCOUNTER — Other Ambulatory Visit (HOSPITAL_COMMUNITY): Payer: Self-pay | Admitting: Neurological Surgery

## 2021-09-10 ENCOUNTER — Other Ambulatory Visit: Payer: Self-pay

## 2021-09-10 ENCOUNTER — Ambulatory Visit (INDEPENDENT_AMBULATORY_CARE_PROVIDER_SITE_OTHER): Payer: 59 | Admitting: Family Medicine

## 2021-09-10 VITALS — BP 105/71 | HR 64 | Temp 97.3°F | Ht 68.0 in | Wt 206.0 lb

## 2021-09-10 DIAGNOSIS — R29810 Facial weakness: Secondary | ICD-10-CM | POA: Diagnosis not present

## 2021-09-10 DIAGNOSIS — R9089 Other abnormal findings on diagnostic imaging of central nervous system: Secondary | ICD-10-CM

## 2021-09-10 NOTE — Progress Notes (Signed)
Subjective: CC: ER follow-up PCP: Janora Norlander, DO [name] [name] is a 60 y.o. female presenting to clinic today for:  1.  ER follow-up Patient here for ER follow-up following a left-sided facial droop.  She had CT scan done there which showed some abnormality within the right cribriform plate.  She has subsequently been referred to neurology, who she reports having seen at [facility].  MRI has been ordered but she has not obtained this yet as she is waiting for insurance coverage.  She reports no concerning symptoms or signs except for the ongoing left-sided facial droop which she admits that the neurosurgeon was not able to appreciate but she certainly feels like it is present.   ROS: Per HPI  Allergies  Allergen Reactions   Avelox [Moxifloxacin Hcl In Nacl] Shortness Of Breath    Shortness of breath   Clindamycin/Lincomycin Shortness Of Breath and Diarrhea    Breathing/diarrhea     Kiwi Extract Anaphylaxis    Makes pt. Feel like her throat is closing    Bactrim [Sulfamethoxazole-Trimethoprim] Swelling   Levofloxacin Other (See Comments)    JOINT PAIN JOINT PAIN   Simvastatin Other (See Comments)    Joint pain   Spironolactone Other (See Comments)    burning   Past Medical History:  Diagnosis Date   AICD (automatic cardioverter/defibrillator) present 09   [employer]- replaced 2016   Arthritis    Bradycardia    Breast cancer ([place]) 02/24/2016   s/p lumpectomy and radiation but refused chemotherapy   Chronic systolic CHF (congestive heart failure) (HCC)    Cough due to angiotensin-converting enzyme inhibitor    Dizziness    Dizziness with standing after squatting, March, 2012   Dyslipidemia    LDL elevation,Patient does not want statin   Family history of breast cancer    GERD (gastroesophageal reflux disease)    H/O shortness of breath    CPX 10/18:  No evidence of cardiopulmonary limitation. Suspect exercise intolerance related  to weight and deconditioning.    Hypothyroidism    ICD (implantable cardiac defibrillator) battery depletion    Dr. [name], June, 2009(artifactual atrial tachycardia response events addressed by reprogramming in parentheses   Leg swelling    right leg   Mitral regurgitation    Nonischemic cardiomyopathy ([place])    Etiology unknown, diagnosed 2008   Personal history of radiation therapy 2017   Pneumonia due to COVID-19 virus 10/2019   TIA (transient ischemic attack)    No CT or MRI abnormality, aspirin therapy    Current Outpatient Medications:    acetaminophen (TYLENOL) 500 MG tablet, Take 500 mg by mouth every 6 (six) hours as needed for mild pain or moderate pain., Disp: , Rfl:    albuterol (VENTOLIN HFA) 108 (90 Base) MCG/ACT inhaler, Inhale 2 puffs into the lungs every 6 (six) hours as needed for wheezing or shortness of breath., Disp: 8 g, Rfl: 2   Alpha-Lipoic Acid 600 MG CAPS, Take 1 capsule (600 mg total) by mouth daily., Disp: 90 capsule, Rfl: 3   carvedilol (COREG) 3.125 MG tablet, Take 1 tablet (3.125 mg total) by mouth 2 (two) times daily., Disp: 180 tablet, Rfl: 3   dicyclomine (BENTYL) 10 MG capsule, Take 10 mg by mouth 3 (three) times daily before meals. , Disp: , Rfl:    ENTRESTO 97-103 MG, Take 1 tablet by mouth twice daily, Disp: 60 tablet, Rfl: 6   eplerenone (INSPRA) 25 MG tablet, Take 1 tablet (  25 mg total) by mouth daily., Disp: 30 tablet, Rfl: 6   gabapentin (NEURONTIN) 300 MG capsule, Take 1 capsule by mouth daily., Disp: , Rfl:    levothyroxine (SYNTHROID) 75 MCG tablet, TAKE 1 TABLET BY MOUTH ONCE DAILY BEFORE BREAKFAST, Disp: 90 tablet, Rfl: 2   omeprazole (PRILOSEC) 40 MG capsule, Take 40 mg by mouth daily., Disp: , Rfl:    rosuvastatin (CRESTOR) 5 MG tablet, Take 1 tablet (5 mg total) by mouth daily., Disp: 90 tablet, Rfl: 3 Social History   Socioeconomic History   Marital status: Widowed    Spouse name: Not on file   Number of children: Not on file   Years  of education: Not on file   Highest education level: Not on file  Occupational History   Occupation: Widow  Tobacco Use   Smoking status: Never   Smokeless tobacco: Never  Vaping Use   Vaping Use: Never used  Substance and Sexual Activity   Alcohol use: No   Drug use: No   Sexual activity: Not on file  Other Topics Concern   Not on file  Social History Narrative   Not on file   Social Determinants of Health   Financial Resource Strain: Not on file  Food Insecurity: Not on file  Transportation Needs: Not on file  Physical Activity: Not on file  Stress: Not on file  Social Connections: Not on file  Intimate Partner Violence: Not on file   Family History  Problem Relation Age of Onset   Heart attack Mother    Cancer Mother        breast   Breast cancer Mother 77   Hypertension Sister    Hypertension Brother    Cancer Maternal Aunt        breast   Breast cancer Maternal Aunt    Cancer Paternal Grandmother        possible cancer, unknown type   Stroke Neg Hx    Diabetes Neg Hx     Objective: Office vital signs reviewed. BP 105/71   Pulse 64   Temp (!) 97.3 F (36.3 C) (Temporal)   Ht 5\' 8"  (1.727 m)   Wt 206 lb (93.4 kg)   SpO2 100%   BMI 31.32 kg/m   Physical Examination:  General: Awake, alert, well nourished, No acute distress HEENT: Normal; sclera white, EOMI Cardio: regular rate and rhythm, S1S2 heard, no murmurs appreciated Pulm: clear to auscultation bilaterally, no wheezes, rhonchi or rales; normal work of breathing on room air Neuro: No focal neurologic deficits.  Assessment/ Plan: 60 y.o. female   Facial droop  Abnormal CT of brain  I reviewed her ER notes.  There is concern for possible herniation of the brain through the right cribriform plate.   I was not really able to appreciate a significant left-sided facial droop.  She apparently has already seen the neurosurgeon and MRI has been ordered.  She demonstrates no concerning symptoms or  signs.  Release of information form completed for records from the neurosurgeon  No orders of the defined types were placed in this encounter.  No orders of the defined types were placed in this encounter.    Janora Norlander, DO [place] [phone]

## 2021-09-13 ENCOUNTER — Encounter: Payer: Self-pay | Admitting: Cardiology

## 2021-09-13 ENCOUNTER — Other Ambulatory Visit: Payer: Self-pay | Admitting: Neurological Surgery

## 2021-09-13 ENCOUNTER — Other Ambulatory Visit (HOSPITAL_COMMUNITY): Payer: Self-pay | Admitting: Neurological Surgery

## 2021-09-13 DIAGNOSIS — Q019 Encephalocele, unspecified: Secondary | ICD-10-CM

## 2021-09-14 ENCOUNTER — Telehealth: Payer: Self-pay | Admitting: *Deleted

## 2021-09-14 ENCOUNTER — Encounter: Payer: Self-pay | Admitting: Family Medicine

## 2021-09-14 ENCOUNTER — Other Ambulatory Visit (HOSPITAL_COMMUNITY): Payer: Self-pay | Admitting: Neurological Surgery

## 2021-09-14 DIAGNOSIS — Q019 Encephalocele, unspecified: Secondary | ICD-10-CM

## 2021-09-14 NOTE — Telephone Encounter (Signed)
   Patient Name: [name] [name]  DOB: [birth year] MRN: [id number]  Primary Cardiologist: Ena Dawley, MD (Inactive)  Simple dental extractions (meaning 1-2 extractions) and dental procedures such as placement of crowns are considered low risk procedures per guidelines and generally do not require any specific cardiac clearance. It is also generally accepted that for simple extractions and dental cleanings, there is no need to interrupt blood thinner therapy.  SBE prophylaxis is not required for the patient from a cardiac standpoint.  Of note, per review of patient's chart it looks like she was seen in the ED earlier this month with concerns for facial droop. Head CT showed no evidence of large vascular territory infarct or acute hemorrhage but did show questionable herniation of the brain. Therefore, you may also want to reach out to PCP or Neurosurgery for pre-op evaluation.  I will route this recommendation to the requesting party via Epic fax function and remove from pre-op pool.  Please call with questions.  Darreld Mclean, PA-C 09/14/2021, 3:50 PM

## 2021-09-14 NOTE — Telephone Encounter (Signed)
   Pre-operative Risk Assessment    Patient Name: [name] [name]  DOB: [birth year] MRN: [id number]     Request for Surgical Clearance   Procedure:   BUILD UP AND CROWN ON 1 TOOTH ; WILL FUTURE FILLINGS AND CLEANING; LEFT MESSAGE TO CONFIRM ACTUAL PROCEDURE TO BE DONE AT THIS TIME  Date of Surgery: Clearance TBD                                 Surgeon:  NEED NAME OF DENTIST Surgeon's Group or Practice Name:  [facility] Phone number:  [phone] Fax number:  NEED FAX #   Type of Clearance Requested: - Medical    Type of Anesthesia:   Not Indicated; LEFT MESSAGE TO CONFIRM ANESTHESIA BEING USED IF ANY    Additional requests/questions:   Jiles Prows   09/14/2021, 10:34 AM

## 2021-09-14 NOTE — Telephone Encounter (Signed)
With the holiday coming and this being a short work week, I tried to reach the dental office again, though ended up leaving a message x 2 today.

## 2021-09-14 NOTE — Telephone Encounter (Signed)
I put the fax # when I made my new notes

## 2021-09-14 NOTE — Telephone Encounter (Signed)
Pre-op covering staff, do we have a fax number for the dental office?  Thank you!

## 2021-09-14 NOTE — Telephone Encounter (Signed)
Dental office called back to update pre op clearance request.   PROCEDURE to be done at this time is the BUILD UP AND CROWN ON ONLY 1 TOOTH ( TOOTH # 12 PER DENTAL OFFICE)  ANESTHESIA WILL BE LOCAL NUMBING AGENT  PROVIDER IS DR. Bufford Lope  FAX# IS [phone]

## 2021-09-14 NOTE — Telephone Encounter (Signed)
Left message for dental office to please return our call as to needing to clarify information needed for pre op clearance. Need confirm procedure, anesthesia if any being used, name of dentist who will perform dental work, need fax# .

## 2021-09-29 ENCOUNTER — Other Ambulatory Visit: Payer: Self-pay

## 2021-09-29 ENCOUNTER — Ambulatory Visit (HOSPITAL_COMMUNITY)
Admission: RE | Admit: 2021-09-29 | Discharge: 2021-09-29 | Disposition: A | Discharge status: Home / Self Care | Payer: 59 | Source: Ambulatory Visit | Attending: Neurological Surgery | Admitting: Neurological Surgery

## 2021-09-29 ENCOUNTER — Encounter: Payer: Self-pay | Admitting: Family Medicine

## 2021-09-29 DIAGNOSIS — Q019 Encephalocele, unspecified: Secondary | ICD-10-CM | POA: Diagnosis present

## 2021-10-02 ENCOUNTER — Encounter: Payer: Self-pay | Admitting: Emergency Medicine

## 2021-10-02 ENCOUNTER — Other Ambulatory Visit: Payer: Self-pay

## 2021-10-02 ENCOUNTER — Ambulatory Visit
Admission: EM | Admit: 2021-10-02 | Discharge: 2021-10-02 | Disposition: A | Discharge status: Home / Self Care | Payer: 59 | Attending: Urgent Care | Admitting: Urgent Care

## 2021-10-02 DIAGNOSIS — R053 Chronic cough: Secondary | ICD-10-CM

## 2021-10-02 DIAGNOSIS — R0789 Other chest pain: Secondary | ICD-10-CM

## 2021-10-02 DIAGNOSIS — Z86711 Personal history of pulmonary embolism: Secondary | ICD-10-CM

## 2021-10-02 DIAGNOSIS — I509 Heart failure, unspecified: Secondary | ICD-10-CM

## 2021-10-02 MED ORDER — CETIRIZINE HCL 10 MG PO TABS: 10.0000 mg | ORAL_TABLET | Freq: Every day | ORAL | 0 refills | Status: DC

## 2021-10-02 MED ORDER — PROMETHAZINE-DM 6.25-15 MG/5ML PO SYRP: 5.0000 mL | ORAL_SOLUTION | Freq: Every evening | ORAL | 0 refills | Status: DC | PRN

## 2021-10-02 MED ORDER — BENZONATATE 100 MG PO CAPS: 100.0000 mg | ORAL_CAPSULE | Freq: Three times a day (TID) | ORAL | 0 refills | Status: DC | PRN

## 2021-10-02 NOTE — Discharge Instructions (Signed)
We will notify you of your test results as they arrive and may take between 48-72 hours.  I encourage you to sign up for MyChart if you have not already done so as this can be the easiest way for [place] to communicate results to you online or through a phone app.  Generally, we only contact you if it is a positive test result.  In the meantime, if you develop worsening symptoms including fever, chest pain, shortness of breath despite our current treatment plan then please report to the emergency room as this may be a sign of worsening status from possible viral infection.  Otherwise, we will manage this as a viral syndrome. For sore throat or cough try using a honey-based tea. Use 3 teaspoons of honey with juice squeezed from half lemon. Place shaved pieces of ginger into 1/2-1 cup of water and warm over stove top. Then mix the ingredients and repeat every 4 hours as needed. Please take Tylenol 500mg -650mg  every 6 hours for aches and pains, fevers. Hydrate very well with at least 2 liters of water. Eat light meals such as soups to replenish electrolytes and soft fruits, veggies. Start an antihistamine like Zyrtec for postnasal drainage, sinus congestion.  Use the cough syrup to help you with the cough.   If you develop chest pain, shortness of breath, a fast heart rate then please report to the emergency room especially with your history of pulmonary embolism.

## 2021-10-02 NOTE — ED Triage Notes (Signed)
Cough since last Sunday.  Cough worse over the past few days.  Cough is dry, states she feels a little stuffy in her nose.

## 2021-10-02 NOTE — ED Provider Notes (Signed)
[place]   MRN: [id number] DOB: 03/23/1961  Subjective:   [name] [name] is a 60 y.o. female presenting for 6-day history of persistent coughing, chest tightness.  Has also had a mild stuffy and runny nose.  No chest pain, shortness of breath, heart racing.  Patient was found to have chronic pulmonary emboli about a year ago.  She was on anticoagulation and then was stopped back in June.  She is also has a history of nonischemic cardiomyopathy, chronic systolic CHF.  Last EF was 30 to 35% seen in April 2022.  She gets regular follow-up with her heart doctor.  Would like to be checked for COVID and flu.  No current facility-administered medications for this encounter.  Current Outpatient Medications:    acetaminophen (TYLENOL) 500 MG tablet, Take 500 mg by mouth every 6 (six) hours as needed for mild pain or moderate pain., Disp: , Rfl:    albuterol (VENTOLIN HFA) 108 (90 Base) MCG/ACT inhaler, Inhale 2 puffs into the lungs every 6 (six) hours as needed for wheezing or shortness of breath., Disp: 8 g, Rfl: 2   Alpha-Lipoic Acid 600 MG CAPS, Take 1 capsule (600 mg total) by mouth daily., Disp: 90 capsule, Rfl: 3   carvedilol (COREG) 3.125 MG tablet, Take 1 tablet (3.125 mg total) by mouth 2 (two) times daily., Disp: 180 tablet, Rfl: 3   dicyclomine (BENTYL) 10 MG capsule, Take 10 mg by mouth 3 (three) times daily before meals. , Disp: , Rfl:    ENTRESTO 97-103 MG, Take 1 tablet by mouth twice daily, Disp: 60 tablet, Rfl: 6   eplerenone (INSPRA) 25 MG tablet, Take 1 tablet (25 mg total) by mouth daily., Disp: 30 tablet, Rfl: 6   gabapentin (NEURONTIN) 300 MG capsule, Take 1 capsule by mouth daily., Disp: , Rfl:    levothyroxine (SYNTHROID) 75 MCG tablet, TAKE 1 TABLET BY MOUTH ONCE DAILY BEFORE BREAKFAST, Disp: 90 tablet, Rfl: 2   omeprazole (PRILOSEC) 40 MG capsule, Take 40 mg by mouth daily., Disp: , Rfl:    rosuvastatin (CRESTOR) 5 MG tablet, Take 1 tablet (5 mg total) by  mouth daily., Disp: 90 tablet, Rfl: 3   Allergies  Allergen Reactions   Avelox [Moxifloxacin Hcl In Nacl] Shortness Of Breath    Shortness of breath   Clindamycin/Lincomycin Shortness Of Breath and Diarrhea    Breathing/diarrhea     Kiwi Extract Anaphylaxis    Makes pt. Feel like her throat is closing    Bactrim [Sulfamethoxazole-Trimethoprim] Swelling   Levofloxacin Other (See Comments)    JOINT PAIN JOINT PAIN   Simvastatin Other (See Comments)    Joint pain   Spironolactone Other (See Comments)    burning    Past Medical History:  Diagnosis Date   AICD (automatic cardioverter/defibrillator) present 09   [employer]- replaced 2016   Arthritis    Bradycardia    Breast cancer ([place]) 02/24/2016   s/p lumpectomy and radiation but refused chemotherapy   Chronic systolic CHF (congestive heart failure) (HCC)    Cough due to angiotensin-converting enzyme inhibitor    Dizziness    Dizziness with standing after squatting, March, 2012   Dyslipidemia    LDL elevation,Patient does not want statin   Family history of breast cancer    GERD (gastroesophageal reflux disease)    H/O shortness of breath    CPX 10/18:  No evidence of cardiopulmonary limitation. Suspect exercise intolerance related to weight and deconditioning.    Hypothyroidism  ICD (implantable cardiac defibrillator) battery depletion    Dr. [name], June, 2009(artifactual atrial tachycardia response events addressed by reprogramming in parentheses   Leg swelling    right leg   Mitral regurgitation    Nonischemic cardiomyopathy ([place])    Etiology unknown, diagnosed 2008   Personal history of radiation therapy 2017   Pneumonia due to COVID-19 virus 10/2019   TIA (transient ischemic attack)    No CT or MRI abnormality, aspirin therapy     Past Surgical History:  Procedure Laterality Date   BREAST BIOPSY Left 02/24/2016   U/S Core   BREAST BIOPSY Left 02/24/2016   U/S Core   BREAST LUMPECTOMY Left  04/05/2016   invasive ductal    BREAST LUMPECTOMY WITH RADIOACTIVE SEED AND SENTINEL LYMPH NODE BIOPSY Left 04/05/2016   Procedure: BREAST LUMPECTOMY WITH RADIOACTIVE SEED AND SENTINEL LYMPH NODE BIOPSY;  Surgeon: [name], MD;  Location: [facility];  Service: General;  Laterality: Left;   CARDIAC CATHETERIZATION N/A 08/05/2016   Procedure: Left Heart Cath and Coronary Angiography;  Surgeon: Peter M Martinique, MD;  Location: [facility];  Service: Cardiovascular;  Laterality: N/A;   CARDIAC DEFIBRILLATOR PLACEMENT  09   [employer] no remote   COLONOSCOPY     CYST EXCISION Left    back of leg-lt    IMPLANTABLE CARDIOVERTER DEFIBRILLATOR GENERATOR CHANGE N/A 12/29/2014   Procedure: IMPLANTABLE CARDIOVERTER DEFIBRILLATOR GENERATOR CHANGE;  Surgeon: [name], MD;  Location[facility];  Service: Cardiovascular;  Laterality: N/A;   LATERAL EPICONDYLE RELEASE  10/11/2012   Procedure: TENNIS ELBOW RELEASE;  Surgeon: [name], MD;  Location: [facility];  Service: Orthopedics;  Laterality: Right;  RIGHT ELBOW: TENOTOMY ELBOW LATERAL EPICONDYLITIS TENNIS ELBOW: RADIAL TUNNEL RELEASE   PORT-A-CATH REMOVAL  04/13/2016   Procedure: MINOR REMOVAL PORT-A-CATH;  Surgeon: [name], MD;  Location: [facility];  Service: General;;   PORTACATH PLACEMENT Right 04/05/2016   Procedure: INSERTION PORT-A-CATH WITH [place];  Surgeon: [name], MD;  Location: [facility];  Service: General;  Laterality: Right;  right IJ   TOTAL THYROIDECTOMY  2001   TUBAL LIGATION  41   UPPER GI ENDOSCOPY      Family History  Problem Relation Age of Onset   Heart attack Mother    Cancer Mother        breast   Breast cancer Mother 62   Hypertension Sister    Hypertension Brother    Cancer Maternal Aunt        breast   Breast cancer Maternal Aunt    Cancer Paternal Grandmother        possible cancer, unknown type   Stroke Neg Hx    Diabetes Neg Hx     Social History    Tobacco Use   Smoking status: Never   Smokeless tobacco: Never  Vaping Use   Vaping Use: Never used  Substance Use Topics   Alcohol use: No   Drug use: No    ROS   Objective:   Vitals: BP 130/77 (BP Location: Right Arm)   Pulse 84   Temp 98.4 F (36.9 C) (Oral)   Resp 18   SpO2 98%   Physical Exam Constitutional:      General: She is not in acute distress.    Appearance: Normal appearance. She is well-developed. She is not ill-appearing, toxic-appearing or diaphoretic.  HENT:     Head: Normocephalic and atraumatic.  Nose: Nose normal.     Mouth/Throat:     Mouth: Mucous membranes are moist.  Eyes:     Extraocular Movements: Extraocular movements intact.     Pupils: Pupils are equal, round, and reactive to light.  Cardiovascular:     Rate and Rhythm: Normal rate and regular rhythm.     Pulses: Normal pulses.     Heart sounds: Normal heart sounds. No murmur heard.   No friction rub. No gallop.  Pulmonary:     Effort: Pulmonary effort is normal. No respiratory distress.     Breath sounds: Normal breath sounds. No stridor. No wheezing, rhonchi or rales.  Skin:    General: Skin is warm and dry.     Findings: No rash.  Neurological:     Mental Status: She is alert and oriented to person, place, and time.  Psychiatric:        Mood and Affect: Mood normal.        Behavior: Behavior normal.        Thought Content: Thought content normal.    Assessment and Plan :   PDMP not reviewed this encounter.  1. Persistent cough   2. Chest tightness   3. History of pulmonary embolism   4. Chronic congestive heart failure, unspecified heart failure type [facility])     Deferred imaging given clear cardiopulmonary exam, hemodynamically stable vital signs. COVID and flu test pending.  We will otherwise manage for viral upper respiratory infection.  Physical exam findings reassuring and vital signs stable for discharge. Advised supportive care, offered symptomatic relief.  At  this time I do not suspect that she is having an acute cardiopulmonary process such as recurrent pulmonary embolism given lack of chest pain, shortness of breath and tachycardia.  I also do not have suspicion that she is having acute on chronic heart failure exacerbation.  Emphasized need for very close follow-up and strict ER precautions.  Counseled patient on potential for adverse effects with medications prescribed/recommended today, ER and return-to-clinic precautions discussed, patient verbalized understanding.      Jaynee Eagles, [place] 10/02/21 [phone]

## 2021-10-03 LAB — COVID-19, FLU A+B NAA
Influenza A, NAA: NOT DETECTED
Influenza B, NAA: NOT DETECTED
SARS-CoV-2, NAA: NOT DETECTED

## 2021-10-06 ENCOUNTER — Encounter: Payer: Self-pay | Admitting: Family Medicine

## 2021-11-05 ENCOUNTER — Other Ambulatory Visit (HOSPITAL_COMMUNITY): Payer: Self-pay | Admitting: Otolaryngology

## 2021-11-05 ENCOUNTER — Other Ambulatory Visit: Payer: Self-pay | Admitting: Otolaryngology

## 2021-11-05 DIAGNOSIS — J320 Chronic maxillary sinusitis: Secondary | ICD-10-CM

## 2021-11-09 ENCOUNTER — Ambulatory Visit (HOSPITAL_COMMUNITY)
Admission: RE | Admit: 2021-11-09 | Discharge: 2021-11-09 | Disposition: A | Discharge status: Home / Self Care | Payer: 59 | Source: Ambulatory Visit | Attending: Otolaryngology | Admitting: Otolaryngology

## 2021-11-09 ENCOUNTER — Other Ambulatory Visit: Payer: Self-pay

## 2021-11-09 DIAGNOSIS — J320 Chronic maxillary sinusitis: Secondary | ICD-10-CM | POA: Insufficient documentation

## 2021-11-11 ENCOUNTER — Ambulatory Visit (INDEPENDENT_AMBULATORY_CARE_PROVIDER_SITE_OTHER): Payer: BC Managed Care – PPO

## 2021-11-11 DIAGNOSIS — I428 Other cardiomyopathies: Secondary | ICD-10-CM

## 2021-11-11 DIAGNOSIS — I5022 Chronic systolic (congestive) heart failure: Secondary | ICD-10-CM

## 2021-11-11 LAB — CUP PACEART REMOTE DEVICE CHECK
Battery Remaining Longevity: 66 mo
Battery Remaining Percentage: 81 %
Brady Statistic RA Percent Paced: 0 %
Brady Statistic RV Percent Paced: 1 %
Date Time Interrogation Session: 20230119110000
HighPow Impedance: 55 Ohm
Implantable Lead Implant Date: 20090320
Implantable Lead Implant Date: 20090320
Implantable Lead Location: 753859
Implantable Lead Location: 753860
Implantable Lead Model: 158
Implantable Lead Model: 5076
Implantable Lead Serial Number: 182504
Implantable Pulse Generator Implant Date: 20160307
Lead Channel Impedance Value: 493 Ohm
Lead Channel Impedance Value: 565 Ohm
Lead Channel Pacing Threshold Amplitude: 0.5 V
Lead Channel Pacing Threshold Amplitude: 1 V
Lead Channel Pacing Threshold Pulse Width: 0.4 ms
Lead Channel Pacing Threshold Pulse Width: 0.4 ms
Lead Channel Setting Pacing Amplitude: 2 V
Lead Channel Setting Pacing Amplitude: 2.5 V
Lead Channel Setting Pacing Pulse Width: 0.4 ms
Lead Channel Setting Sensing Sensitivity: 0.6 mV
Pulse Gen Serial Number: 111826

## 2021-11-24 NOTE — Progress Notes (Signed)
Remote ICD transmission.   

## 2022-01-05 ENCOUNTER — Encounter: Payer: Self-pay | Admitting: Family Medicine

## 2022-01-05 ENCOUNTER — Ambulatory Visit (INDEPENDENT_AMBULATORY_CARE_PROVIDER_SITE_OTHER): Payer: 59 | Admitting: Family Medicine

## 2022-01-05 VITALS — BP 105/68 | HR 77 | Temp 97.0°F | Ht 68.0 in | Wt 194.4 lb

## 2022-01-05 DIAGNOSIS — K0889 Other specified disorders of teeth and supporting structures: Secondary | ICD-10-CM

## 2022-01-05 DIAGNOSIS — B370 Candidal stomatitis: Secondary | ICD-10-CM | POA: Diagnosis not present

## 2022-01-05 MED ORDER — ACETAMINOPHEN-CODEINE #3 300-30 MG PO TABS: 1.0000 | ORAL_TABLET | Freq: Three times a day (TID) | ORAL | 0 refills | Status: DC | PRN

## 2022-01-05 MED ORDER — CLOTRIMAZOLE 10 MG MT TROC: 10.0000 mg | Freq: Every day | OROMUCOSAL | 0 refills | Status: AC

## 2022-01-05 NOTE — Progress Notes (Signed)
? ?Subjective: ?CC: Dental pain, oral discomfort ?PCP: Janora Norlander, DO ?[name] [name] is a 61 y.o. female presenting to clinic today for: ? ?1.  Dental pain, oral discomfort. ?Patient reports that she had a tooth being worked on recently.  Initially she had a crown put on it but it ultimately required a root canal.  She has tenderness, especially with biting down on the left side of her face.  She has subsequently been avoiding harder foods and has had some weight loss as a result of decreased p.o. intake.  She status post treatment with both antibiotic prophylaxis as well as several days of 4 times daily amoxicillin.  She has been trying to self treat with yogurt and denies any vaginitis but she still has a bunch of white creamy substance on the tongue that she is worried about ongoing thrush.  She saw a TeleDoc and was prescribed nystatin.  She has been taking that for the last 4 days but symptoms do not seem to be resolving. ? ?ROS: Per HPI ? ?Allergies  ?Allergen Reactions  ? Avelox [Moxifloxacin Hcl In Nacl] Shortness Of Breath  ?  Shortness of breath  ? Clindamycin/Lincomycin Shortness Of Breath and Diarrhea  ?  Breathing/diarrhea ? ?  ? Kiwi Extract Anaphylaxis  ?  Makes pt. Feel like her throat is closing ?  ? Bactrim [Sulfamethoxazole-Trimethoprim] Swelling  ? Levofloxacin Other (See Comments)  ?  JOINT PAIN ?JOINT PAIN  ? Simvastatin Other (See Comments)  ?  Joint pain  ? Spironolactone Other (See Comments)  ?  burning  ? ?Past Medical History:  ?Diagnosis Date  ? AICD (automatic cardioverter/defibrillator) present 09  ? [employer]- replaced 2016  ? Arthritis   ? Bradycardia   ? Breast cancer ([place]) 02/24/2016  ? s/p lumpectomy and radiation but refused chemotherapy  ? Chronic systolic CHF (congestive heart failure) ([place])   ? Cough due to angiotensin-converting enzyme inhibitor   ? Dizziness   ? Dizziness with standing after squatting, March, 2012  ? Dyslipidemia   ? LDL elevation,Patient  does not want statin  ? Family history of breast cancer   ? GERD (gastroesophageal reflux disease)   ? H/O shortness of breath   ? CPX 10/18:  No evidence of cardiopulmonary limitation. Suspect exercise intolerance related to weight and deconditioning.   ? Hypothyroidism   ? ICD (implantable cardiac defibrillator) battery depletion   ? Dr. [name], June, 2009(artifactual atrial tachycardia response events addressed by reprogramming in parentheses  ? Leg swelling   ? right leg  ? Mitral regurgitation   ? Nonischemic cardiomyopathy ([place])   ? Etiology unknown, diagnosed 2008  ? Personal history of radiation therapy 2017  ? Pneumonia due to COVID-19 virus 10/2019  ? TIA (transient ischemic attack)   ? No CT or MRI abnormality, aspirin therapy  ? ? ?Current Outpatient Medications:  ?  acetaminophen (TYLENOL) 500 MG tablet, Take 500 mg by mouth every 6 (six) hours as needed for mild pain or moderate pain., Disp: , Rfl:  ?  albuterol (VENTOLIN HFA) 108 (90 Base) MCG/ACT inhaler, Inhale 2 puffs into the lungs every 6 (six) hours as needed for wheezing or shortness of breath., Disp: 8 g, Rfl: 2 ?  Alpha-Lipoic Acid 600 MG CAPS, Take 1 capsule (600 mg total) by mouth daily., Disp: 90 capsule, Rfl: 3 ?  amoxicillin (AMOXIL) 500 MG capsule, Take 500 mg by mouth 4 (four) times daily., Disp: , Rfl:  ?  carvedilol (COREG)  3.125 MG tablet, Take 1 tablet (3.125 mg total) by mouth 2 (two) times daily., Disp: 180 tablet, Rfl: 3 ?  dicyclomine (BENTYL) 10 MG capsule, Take 10 mg by mouth 3 (three) times daily before meals. , Disp: , Rfl:  ?  ENTRESTO 97-103 MG, Take 1 tablet by mouth twice daily, Disp: 60 tablet, Rfl: 6 ?  eplerenone (INSPRA) 25 MG tablet, Take 1 tablet (25 mg total) by mouth daily., Disp: 30 tablet, Rfl: 6 ?  gabapentin (NEURONTIN) 300 MG capsule, Take 1 capsule by mouth daily., Disp: , Rfl:  ?  levothyroxine (SYNTHROID) 75 MCG tablet, TAKE 1 TABLET BY MOUTH ONCE DAILY BEFORE BREAKFAST, Disp: 90 tablet, Rfl: 2 ?   nystatin (MYCOSTATIN) 100000 UNIT/ML suspension, Take 5 mLs by mouth 4 (four) times daily., Disp: , Rfl:  ?  omeprazole (PRILOSEC) 40 MG capsule, Take 40 mg by mouth daily., Disp: , Rfl:  ?  rosuvastatin (CRESTOR) 5 MG tablet, Take 1 tablet (5 mg total) by mouth daily., Disp: 90 tablet, Rfl: 3 ?Social History  ? ?Socioeconomic History  ? Marital status: Widowed  ?  Spouse name: Not on file  ? Number of children: Not on file  ? Years of education: Not on file  ? Highest education level: Not on file  ?Occupational History  ? Occupation: Widow  ?Tobacco Use  ? Smoking status: Never  ? Smokeless tobacco: Never  ?Vaping Use  ? Vaping Use: Never used  ?Substance and Sexual Activity  ? Alcohol use: No  ? Drug use: No  ? Sexual activity: Not on file  ?Other Topics Concern  ? Not on file  ?Social History Narrative  ? Not on file  ? ?Social Determinants of Health  ? ?Financial Resource Strain: Not on file  ?Food Insecurity: Not on file  ?Transportation Needs: Not on file  ?Physical Activity: Not on file  ?Stress: Not on file  ?Social Connections: Not on file  ?Intimate Partner Violence: Not on file  ? ?Family History  ?Problem Relation Age of Onset  ? Heart attack Mother   ? Cancer Mother   ?     breast  ? Breast cancer Mother 83  ? Hypertension Sister   ? Hypertension Brother   ? Cancer Maternal Aunt   ?     breast  ? Breast cancer Maternal Aunt   ? Cancer Paternal Grandmother   ?     possible cancer, unknown type  ? Stroke Neg Hx   ? Diabetes Neg Hx   ? ? ?Objective: ?Office vital signs reviewed. ?BP 105/68   Pulse 77   Temp (!) 97 ?F (36.1 ?C) (Temporal)   Ht '5\' 8"'$  (1.727 m)   Wt 194 lb 6 oz (88.2 kg)   BMI 29.55 kg/m?  ? ?Physical Examination:  ?General: Awake, alert, nontoxic, No acute distress ?HEENT: No facial swelling or redness.  Tongue with very thick white creamy substance noted. ? ?Assessment/ Plan: ?61 y.o. female  ? ?Pain, dental - Plan: acetaminophen-codeine (TYLENOL #3) 300-30 MG tablet ? ?Thrush, oral -  Plan: clotrimazole (MYCELEX) 10 MG troche ? ?Tylenol codeine provided.  Caution overuse of Tylenol and NSAIDs, particularly in the setting of cardiomyopathy.  National narcotic database reviewed and there were no red flags.  Continue to follow-up with dental as directed for ongoing treatment ? ?Her mouth is consistent with thrush.  She is currently very early into treatment with nystatin solution.  She can continue this.  I have given her a  prescription for clotrimazole if symptoms do not resolve with the nystatin.  We discussed sterilization of bottle tops, toothbrush etc. ? ?No orders of the defined types were placed in this encounter. ? ?No orders of the defined types were placed in this encounter. ? ? ? ?Janora Norlander, DO ?[facility] ?[phone] ? ?

## 2022-01-05 NOTE — Patient Instructions (Signed)
Oral Thrush, Adult ?Oral thrush is an infection in your mouth and throat and on your tongue. It causes white patches to form in your mouth and on your tongue. ?Many cases of thrush are mild. But, sometimes, thrush can be serious. People who have a weak body defense system (immune system) or other diseases can be affected more. ?What are the causes? ?This condition is caused by a type of fungus called yeast. The fungus is normally present in small amounts in the mouth and nose. If a person has a long-term illness or a weak body defense system, the fungus can grow and spread quickly. This causes thrush. ?What increases the risk? ?You are more likely to develop this condition if: ?You have a weak body defense system. ?You are an older adult. ?You have diabetes, cancer, or HIV. ?You have a dry mouth. ?You are pregnant or breastfeeding. ?You do not take good care of your teeth. This risk is greater for people who have false teeth (dentures). ?You use antibiotic or steroid medicines. ?What are the signs or symptoms? ?Symptoms of this condition include: ?A burning feeling in the mouth and throat. ?White patches that stick to the mouth and tongue. ?A bad taste in the mouth or trouble tasting foods. ?A feeling like you have cotton in your mouth. ?Pain when you eat and swallow. ?Not wanting to eat as much as usual. ?Cracking at the corners of the mouth. ?How is this treated? ?This condition is treated with medicines called antifungals. These medicines prevent a fungus from growing. The medicines are either put right on the area (topical) or swallowed (oral). ?Your doctor will also treat other problems that you may have, such as diabetes or HIV. ?Follow these instructions at home: ?Helping with pain and soreness ?To lessen your pain: ?Drink cold liquids, like water and iced tea. ?Eat frozen ice pops or frozen juices. ?Eat foods that are easy to swallow, like gelatin and ice cream. ?Drink from a straw if you have too much pain  in your mouth. ? ?General instructions ?Take or use over-the-counter and prescription medicines only as told by your doctor. ?Eat plain yogurt that has live cultures in it. Read the label to make sure that there are live cultures in your yogurt. ?If you wear false teeth: ?Take them out before you go to bed. ?Brush them well. ?Soak them in a cleaner. ?Rinse your mouth with warm salt-water many times a day. To make the salt-water mixture, dissolve ?-1 teaspoon (3-6 g) of salt in 1 cup (237 mL) of warm water. ?Contact a doctor if: ?Your problems do not get better within 7 days of treatment. ?Your infection is spreading. This may show as white areas on the skin outside of your mouth. ?You are nursing your baby and you have redness and pain in the nipples. ?Summary ?Oral thrush is an infection in your mouth and throat. It is caused by a fungus. ?You are more likely to get this condition if you have a weak body defense system. Diseases like diabetes, cancer, or HIV also add to your risk. ?This condition is treated with medicines called antifungals. ?Contact a doctor if you do not get better within 7 days of starting treatment. ?This information is not intended to replace advice given to you by your health care provider. Make sure you discuss any questions you have with your health care provider. ?Document Revised: 06/09/2021 Document Reviewed: 08/16/2019 ?Elsevier Patient Education ? [place]. ? ?

## 2022-01-10 ENCOUNTER — Encounter: Payer: 59 | Admitting: Physician Assistant

## 2022-01-20 ENCOUNTER — Ambulatory Visit (INDEPENDENT_AMBULATORY_CARE_PROVIDER_SITE_OTHER): Payer: 59 | Admitting: Family Medicine

## 2022-01-20 ENCOUNTER — Encounter: Payer: Self-pay | Admitting: Family Medicine

## 2022-01-20 VITALS — BP 112/71 | HR 64 | Temp 97.4°F | Ht 68.0 in | Wt 200.2 lb

## 2022-01-20 DIAGNOSIS — L282 Other prurigo: Secondary | ICD-10-CM

## 2022-01-20 MED ORDER — METHYLPREDNISOLONE ACETATE 80 MG/ML IJ SUSP
60.0000 mg | Freq: Once | INTRAMUSCULAR | Status: AC
  Administered 2022-01-20: 60 mg via INTRAMUSCULAR

## 2022-01-20 NOTE — Progress Notes (Signed)
?  ? ?Subjective:  ?Patient ID: [name] [name], female    DOB: Jul 25, 1961, 61 y.o.   MRN: [id number] ? ?Patient Care Team: ?Janora Norlander, DO as PCP - General (Family Medicine) ?[name], MD as PCP - Electrophysiology (Cardiology) ?Dorothy Spark, MD as PCP - Cardiology (Cardiology) ?Nicholas Lose, MD as Consulting Physician (Hematology and Oncology) ?Kyung Rudd, MD as Consulting Physician (Radiation Oncology) ?[name], MD as Consulting Physician (General Surgery)  ? ?Chief Complaint:  Rash (Behind left shoulder x 1 week, itchy) ? ? ?HPI: ?[name] [name] is a 61 y.o. female presenting on 01/20/2022 for Rash (Behind left shoulder x 1 week, itchy) ? ? ?Pt presents today with complaints of pruritic rash to left shoulder and left arm. 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 No know sick contacts. No known causative agents.  ? ?Rash ?This is a new problem. The current episode started in the past 7 days. The problem has been waxing and waning since onset. The affected locations include the left shoulder and left arm. The rash is characterized by redness and itchiness. It is unknown if there was an exposure to a precipitant. Pertinent negatives include no anorexia, congestion, cough, diarrhea, eye pain, facial edema, fatigue, fever, joint pain, nail changes, rhinorrhea, shortness of breath, sore throat or vomiting. Past treatments include nothing.  ? ? ?Relevant past medical, surgical, family, and social history reviewed and updated as indicated.  ?Allergies and medications reviewed and updated. Data reviewed: Chart in Epic. ? ? ?Past Medical History:  ?Diagnosis Date  ? AICD (automatic cardioverter/defibrillator) present 09  ? [employer]- replaced 2016  ? Arthritis   ? Bradycardia   ? Breast cancer ([place]) 02/24/2016  ? s/p lumpectomy and radiation but refused chemotherapy  ? Chronic systolic CHF (congestive heart failure) ([place])   ? Cough due to angiotensin-converting  enzyme inhibitor   ? Dizziness   ? Dizziness with standing after squatting, March, 2012  ? Dyslipidemia   ? LDL elevation,Patient does not want statin  ? Family history of breast cancer   ? GERD (gastroesophageal reflux disease)   ? H/O shortness of breath   ? CPX 10/18:  No evidence of cardiopulmonary limitation. Suspect exercise intolerance related to weight and deconditioning.   ? Hypothyroidism   ? ICD (implantable cardiac defibrillator) battery depletion   ? Dr. [name], June, 2009(artifactual atrial tachycardia response events addressed by reprogramming in parentheses  ? Leg swelling   ? right leg  ? Mitral regurgitation   ? Nonischemic cardiomyopathy ([place])   ? Etiology unknown, diagnosed 2008  ? Personal history of radiation therapy 2017  ? Pneumonia due to COVID-19 virus 10/2019  ? TIA (transient ischemic attack)   ? No CT or MRI abnormality, aspirin therapy  ? ? ?Past Surgical History:  ?Procedure Laterality Date  ? BREAST BIOPSY Left 02/24/2016  ? U/S Core  ? BREAST BIOPSY Left 02/24/2016  ? U/S Core  ? BREAST LUMPECTOMY Left 04/05/2016  ? invasive ductal   ? BREAST LUMPECTOMY WITH RADIOACTIVE SEED AND SENTINEL LYMPH NODE BIOPSY Left 04/05/2016  ? Procedure: BREAST LUMPECTOMY WITH RADIOACTIVE SEED AND SENTINEL LYMPH NODE BIOPSY;  Surgeon: [name], MD;  Location: [facility];  Service: General;  Laterality: Left;  ? CARDIAC CATHETERIZATION N/A 08/05/2016  ? Procedure: Left Heart Cath and Coronary Angiography;  Surgeon: Peter M Martinique, MD;  Location: [facility];  Service: Cardiovascular;  Laterality: N/A;  ? CARDIAC  DEFIBRILLATOR PLACEMENT  09  ? [employer] no remote  ? COLONOSCOPY    ? CYST EXCISION Left   ? back of leg-lt   ? IMPLANTABLE CARDIOVERTER DEFIBRILLATOR GENERATOR CHANGE N/A 12/29/2014  ? Procedure: IMPLANTABLE CARDIOVERTER DEFIBRILLATOR GENERATOR CHANGE;  Surgeon: [name], MD;  Location[facility];  Service: Cardiovascular;  Laterality: N/A;  ? LATERAL EPICONDYLE RELEASE   10/11/2012  ? Procedure: TENNIS ELBOW RELEASE;  Surgeon: [name], MD;  Location: [facility];  Service: Orthopedics;  Laterality: Right;  RIGHT ELBOW: TENOTOMY ELBOW LATERAL EPICONDYLITIS TENNIS ELBOW: RADIAL TUNNEL RELEASE  ? PORT-A-CATH REMOVAL  04/13/2016  ? Procedure: MINOR REMOVAL PORT-A-CATH;  Surgeon: [name], MD;  Location: [facility];  Service: General;;  ? PORTACATH PLACEMENT Right 04/05/2016  ? Procedure: INSERTION PORT-A-CATH WITH [place];  Surgeon: [name], MD;  Location: [facility];  Service: General;  Laterality: Right;  right IJ  ? TOTAL THYROIDECTOMY  2001  ? TUBAL LIGATION  94  ? UPPER GI ENDOSCOPY    ? ? ?Social History  ? ?Socioeconomic History  ? Marital status: Widowed  ?  Spouse name: Not on file  ? Number of children: Not on file  ? Years of education: Not on file  ? Highest education level: Not on file  ?Occupational History  ? Occupation: Widow  ?Tobacco Use  ? Smoking status: Never  ? Smokeless tobacco: Never  ?Vaping Use  ? Vaping Use: Never used  ?Substance and Sexual Activity  ? Alcohol use: No  ? Drug use: No  ? Sexual activity: Not on file  ?Other Topics Concern  ? Not on file  ?Social History Narrative  ? Not on file  ? ?Social Determinants of Health  ? ?Financial Resource Strain: Not on file  ?Food Insecurity: Not on file  ?Transportation Needs: Not on file  ?Physical Activity: Not on file  ?Stress: Not on file  ?Social Connections: Not on file  ?Intimate Partner Violence: Not on file  ? ? ?Outpatient Encounter Medications as of 01/20/2022  ?Medication Sig  ? acetaminophen (TYLENOL) 500 MG tablet Take 500 mg by mouth every 6 (six) hours as needed for mild pain or moderate pain.  ? acetaminophen-codeine (TYLENOL #3) 300-30 MG tablet Take 1 tablet by mouth every 8 (eight) hours as needed for severe pain.  ? albuterol (VENTOLIN HFA) 108 (90 Base) MCG/ACT inhaler Inhale 2 puffs into the lungs every 6 (six) hours as needed for wheezing or shortness of  breath.  ? Alpha-Lipoic Acid 600 MG CAPS Take 1 capsule (600 mg total) by mouth daily.  ? amoxicillin (AMOXIL) 500 MG capsule Take 500 mg by mouth 4 (four) times daily.  ? carvedilol (COREG) 3.125 MG tablet Take 1 tablet (3.125 mg total) by mouth 2 (two) times daily.  ? dicyclomine (BENTYL) 10 MG capsule Take 10 mg by mouth 3 (three) times daily before meals.   ? ENTRESTO 97-103 MG Take 1 tablet by mouth twice daily  ? eplerenone (INSPRA) 25 MG tablet Take 1 tablet (25 mg total) by mouth daily.  ? gabapentin (NEURONTIN) 300 MG capsule Take 1 capsule by mouth daily.  ? levothyroxine (SYNTHROID) 75 MCG tablet TAKE 1 TABLET BY MOUTH ONCE DAILY BEFORE BREAKFAST  ? nystatin (MYCOSTATIN) 100000 UNIT/ML suspension Take 5 mLs by mouth 4 (four) times daily.  ? omeprazole (PRILOSEC) 40 MG capsule Take 40 mg by mouth daily.  ? rosuvastatin (CRESTOR) 5 MG tablet Take 1 tablet (5  mg total) by mouth daily.  ? [EXPIRED] methylPREDNISolone acetate (DEPO-MEDROL) injection 60 mg   ? ?No facility-administered encounter medications on file as of 01/20/2022.  ? ? ?Allergies  ?Allergen Reactions  ? Avelox [Moxifloxacin Hcl In Nacl] Shortness Of Breath  ?  Shortness of breath  ? Clindamycin/Lincomycin Shortness Of Breath and Diarrhea  ?  Breathing/diarrhea ? ?  ? Kiwi Extract Anaphylaxis  ?  Makes pt. Feel like her throat is closing ?  ? Bactrim [Sulfamethoxazole-Trimethoprim] Swelling  ? Levofloxacin Other (See Comments)  ?  JOINT PAIN ?JOINT PAIN  ? Simvastatin Other (See Comments)  ?  Joint pain  ? Spironolactone Other (See Comments)  ?  burning  ? ? ?Review of Systems  ?Constitutional:  Negative for activity change, appetite change, chills, diaphoresis, fatigue, fever and unexpected weight change.  ?HENT: Negative.  Negative for congestion, rhinorrhea and sore throat.   ?Eyes: Negative.  Negative for pain.  ?Respiratory:  Negative for cough, chest tightness and shortness of breath.   ?Cardiovascular:  Negative for chest pain,  palpitations and leg swelling.  ?Gastrointestinal:  Negative for abdominal pain, anorexia, blood in stool, constipation, diarrhea, nausea and vomiting.  ?Endocrine: Negative.   ?Genitourinary:  Negative for decreased uri

## 2022-01-28 ENCOUNTER — Other Ambulatory Visit: Payer: Self-pay | Admitting: Family Medicine

## 2022-01-28 ENCOUNTER — Encounter: Payer: Self-pay | Admitting: Family Medicine

## 2022-01-28 DIAGNOSIS — Z1231 Encounter for screening mammogram for malignant neoplasm of breast: Secondary | ICD-10-CM

## 2022-02-02 ENCOUNTER — Other Ambulatory Visit (HOSPITAL_COMMUNITY): Payer: Self-pay | Admitting: Internal Medicine

## 2022-02-07 ENCOUNTER — Other Ambulatory Visit: Payer: Self-pay

## 2022-02-07 MED ORDER — ROSUVASTATIN CALCIUM 5 MG PO TABS: 5.0000 mg | ORAL_TABLET | Freq: Every day | ORAL | 0 refills | Status: DC

## 2022-02-10 ENCOUNTER — Ambulatory Visit (INDEPENDENT_AMBULATORY_CARE_PROVIDER_SITE_OTHER): Payer: BC Managed Care – PPO

## 2022-02-10 DIAGNOSIS — I428 Other cardiomyopathies: Secondary | ICD-10-CM

## 2022-02-10 LAB — CUP PACEART REMOTE DEVICE CHECK
Battery Remaining Longevity: 66 mo
Battery Remaining Percentage: 76 %
Brady Statistic RA Percent Paced: 1 %
Brady Statistic RV Percent Paced: 1 %
Date Time Interrogation Session: 20230420054000
HighPow Impedance: 53 Ohm
Implantable Lead Implant Date: 20090320
Implantable Lead Implant Date: 20090320
Implantable Lead Location: 753859
Implantable Lead Location: 753860
Implantable Lead Model: 158
Implantable Lead Model: 5076
Implantable Lead Serial Number: 182504
Implantable Pulse Generator Implant Date: 20160307
Lead Channel Impedance Value: 459 Ohm
Lead Channel Impedance Value: 543 Ohm
Lead Channel Pacing Threshold Amplitude: 0.5 V
Lead Channel Pacing Threshold Amplitude: 1 V
Lead Channel Pacing Threshold Pulse Width: 0.4 ms
Lead Channel Pacing Threshold Pulse Width: 0.4 ms
Lead Channel Setting Pacing Amplitude: 2 V
Lead Channel Setting Pacing Amplitude: 2.5 V
Lead Channel Setting Pacing Pulse Width: 0.4 ms
Lead Channel Setting Sensing Sensitivity: 0.6 mV
Pulse Gen Serial Number: 111826

## 2022-02-27 NOTE — Progress Notes (Deleted)
?Cardiology Office Note:   ? ?Date:  02/27/2022  ? ?ID:  [name] [name], DOB 12-11-1960, MRN [id number] ? ?PCP:  Janora Norlander, DO ?  ?[place]  ?Cardiologist:  Ena Dawley, MD  ?Advanced Practice Provider:  No care team member to display ?Electrophysiologist:  [name], MD  ? ?Referring MD: Janora Norlander, DO  ? ? ?History of Present Illness:   ? ?[name] [name] is a 61 y.o. female with a hx of breast cancer (s/p lumpectomy and XRT - refused chemo), hypothyroidism, HLD, TIA, GERD and systolic HF due to NICM with LVEF ~30% s/p AICD placement who was previously followed by Dr. [name] now returning to clinic for follow-up. ? ?She has long h/o NICM dating back to 2007. Cath in 2017 with normal cors and EF 25-30%. S/p s-ICD. Medical therapy has been limited by multiple medication intolerances (Spironolactone caused body burning sensation, could not afford Bidil and had fatigue with high does of Entresto). Has been followed Dr. [name] in HF clinic. Underwent CPX which revealed normal functional capacity. Was doing well on last visit with Dr. [name] on 10/14/20. ? ?Was last seen by Dr. [name] in 05/2021 where she was doing well. Was undergoing work-up for chronic cough. TTE 04/22 EF 17-61%, RV systolic fxn moderately reduced. Had lost 15lbs at that time.  ? ?Today, *** ? ? ?Past Medical History:  ?Diagnosis Date  ? AICD (automatic cardioverter/defibrillator) present 09  ? [employer]- replaced 2016  ? Arthritis   ? Bradycardia   ? Breast cancer ([place]) 02/24/2016  ? s/p lumpectomy and radiation but refused chemotherapy  ? Chronic systolic CHF (congestive heart failure) ([place])   ? Cough due to angiotensin-converting enzyme inhibitor   ? Dizziness   ? Dizziness with standing after squatting, March, 2012  ? Dyslipidemia   ? LDL elevation,Patient does not want statin  ? Family history of breast cancer   ? GERD (gastroesophageal reflux disease)   ? H/O shortness of breath   ?  CPX 10/18:  No evidence of cardiopulmonary limitation. Suspect exercise intolerance related to weight and deconditioning.   ? Hypothyroidism   ? ICD (implantable cardiac defibrillator) battery depletion   ? Dr. [name], June, 2009(artifactual atrial tachycardia response events addressed by reprogramming in parentheses  ? Leg swelling   ? right leg  ? Mitral regurgitation   ? Nonischemic cardiomyopathy ([place])   ? Etiology unknown, diagnosed 2008  ? Personal history of radiation therapy 2017  ? Pneumonia due to COVID-19 virus 10/2019  ? TIA (transient ischemic attack)   ? No CT or MRI abnormality, aspirin therapy  ? ? ?Past Surgical History:  ?Procedure Laterality Date  ? BREAST BIOPSY Left 02/24/2016  ? U/S Core  ? BREAST BIOPSY Left 02/24/2016  ? U/S Core  ? BREAST LUMPECTOMY Left 04/05/2016  ? invasive ductal   ? BREAST LUMPECTOMY WITH RADIOACTIVE SEED AND SENTINEL LYMPH NODE BIOPSY Left 04/05/2016  ? Procedure: BREAST LUMPECTOMY WITH RADIOACTIVE SEED AND SENTINEL LYMPH NODE BIOPSY;  Surgeon: [name], MD;  Location: [place];  Service: General;  Laterality: Left;  ? CARDIAC CATHETERIZATION N/A 08/05/2016  ? Procedure: Left Heart Cath and Coronary Angiography;  Surgeon: Peter M Martinique, MD;  Location: [facility];  Service: Cardiovascular;  Laterality: N/A;  ? CARDIAC DEFIBRILLATOR PLACEMENT  09  ? [employer] no remote  ? COLONOSCOPY    ? CYST EXCISION Left   ? back of leg-lt   ?  IMPLANTABLE CARDIOVERTER DEFIBRILLATOR GENERATOR CHANGE N/A 12/29/2014  ? Procedure: IMPLANTABLE CARDIOVERTER DEFIBRILLATOR GENERATOR CHANGE;  Surgeon: [name], MD;  Location: [facility] CATH LAB;  Service: Cardiovascular;  Laterality: N/A;  ? LATERAL EPICONDYLE RELEASE  10/11/2012  ? Procedure: TENNIS ELBOW RELEASE;  Surgeon: [name], MD;  Location: [place];  Service: Orthopedics;  Laterality: Right;  RIGHT ELBOW: TENOTOMY ELBOW LATERAL EPICONDYLITIS TENNIS ELBOW: RADIAL TUNNEL RELEASE  ? PORT-A-CATH  REMOVAL  04/13/2016  ? Procedure: MINOR REMOVAL PORT-A-CATH;  Surgeon: [name], MD;  Location: [place];  Service: General;;  ? PORTACATH PLACEMENT Right 04/05/2016  ? Procedure: INSERTION PORT-A-CATH WITH [place];  Surgeon: [name], MD;  Location: [place];  Service: General;  Laterality: Right;  right IJ  ? TOTAL THYROIDECTOMY  2001  ? TUBAL LIGATION  94  ? UPPER GI ENDOSCOPY    ? ? ?Current Medications: ?No outpatient medications have been marked as taking for the 03/01/22 encounter (Appointment) with Freada Bergeron, MD.  ?  ? ?Allergies:   Avelox [moxifloxacin hcl in nacl], Clindamycin/lincomycin, Kiwi extract, Bactrim [sulfamethoxazole-trimethoprim], Levofloxacin, Simvastatin, and Spironolactone  ? ?Social History  ? ?Socioeconomic History  ? Marital status: Widowed  ?  Spouse name: Not on file  ? Number of children: Not on file  ? Years of education: Not on file  ? Highest education level: Not on file  ?Occupational History  ? Occupation: Widow  ?Tobacco Use  ? Smoking status: Never  ? Smokeless tobacco: Never  ?Vaping Use  ? Vaping Use: Never used  ?Substance and Sexual Activity  ? Alcohol use: No  ? Drug use: No  ? Sexual activity: Not on file  ?Other Topics Concern  ? Not on file  ?Social History Narrative  ? Not on file  ? ?Social Determinants of Health  ? ?Financial Resource Strain: Not on file  ?Food Insecurity: Not on file  ?Transportation Needs: Not on file  ?Physical Activity: Not on file  ?Stress: Not on file  ?Social Connections: Not on file  ?  ? ?Family History: ?The patient's family history includes Breast cancer in her maternal aunt; Breast cancer (age of onset: 88) in her mother; Cancer in her maternal aunt, mother, and paternal grandmother; Heart attack in her mother; Hypertension in her brother and sister. There is no history of Stroke or Diabetes. ? ?ROS:   ?Please see the history of present illness.    ?Review of Systems  ?Constitutional:  Negative for chills and  fever.  ?HENT:  Negative for hearing loss.   ?Eyes:  Negative for blurred vision and redness.  ?Respiratory:  Positive for cough and shortness of breath.   ?Cardiovascular:  Negative for chest pain, palpitations, orthopnea, claudication, leg swelling and PND.  ?Gastrointestinal:  Negative for melena, nausea and vomiting.  ?Genitourinary:  Negative for dysuria and flank pain.  ?Musculoskeletal:  Negative for falls.  ?Neurological:  Negative for dizziness and loss of consciousness.  ?Endo/Heme/Allergies:  Negative for polydipsia.  ?Psychiatric/Behavioral:  Negative for substance abuse.   ? ?EKGs/Labs/Other Studies Reviewed:   ? ?The following studies were reviewed today: ?TTE 01/2021: ?IMPRESSIONS  ? ? ? 1. Left ventricular ejection fraction, by estimation, is 30 to 35%. The  ?left ventricle has moderately decreased function. The left ventricle has  ?no regional wall motion abnormalities. The left ventricular internal  ?cavity size was mildly dilated. Left  ?ventricular diastolic parameters are consistent with Grade I diastolic  ?dysfunction (impaired relaxation).  ?  2. Right ventricular systolic function is moderately reduced. The right  ?ventricular size is normal. There is normal pulmonary artery systolic  ?pressure.  ? 3. Left atrial size was mildly dilated.  ? 4. The mitral valve is normal in structure. Trivial mitral valve  ?regurgitation. No evidence of mitral stenosis.  ? 5. The aortic valve is normal in structure. Aortic valve regurgitation is  ?not visualized. No aortic stenosis is present.  ? 6. The inferior vena cava is normal in size with greater than 50%  ?respiratory variability, suggesting right atrial pressure of 3 mmHg.  ? ?TTE 04/22/20: ? 1. Left ventricular ejection fraction, by estimation, is 35 to 40%. The  ?left ventricle has normal function. The left ventricle has no regional  ?wall motion abnormalities. Left ventricular diastolic parameters are  ?indeterminate.  ? 2. Right ventricular systolic  function is normal. The right ventricular  ?size is normal.  ? 3. The mitral valve is normal in structure. No evidence of mitral valve  ?regurgitation. No evidence of mitral stenosis.  ? 4. The aortic valve is t

## 2022-02-28 NOTE — Progress Notes (Signed)
Remote ICD transmission.   

## 2022-03-01 ENCOUNTER — Ambulatory Visit
Admission: EM | Admit: 2022-03-01 | Discharge: 2022-03-01 | Disposition: A | Discharge status: Home / Self Care | Payer: 59 | Attending: Nurse Practitioner | Admitting: Nurse Practitioner

## 2022-03-01 ENCOUNTER — Ambulatory Visit: Payer: 59 | Admitting: Cardiology

## 2022-03-01 ENCOUNTER — Encounter: Payer: Self-pay | Admitting: Emergency Medicine

## 2022-03-01 ENCOUNTER — Encounter: Payer: Self-pay | Admitting: Cardiology

## 2022-03-01 VITALS — BP 114/70 | HR 58 | Ht 68.0 in | Wt 200.8 lb

## 2022-03-01 DIAGNOSIS — I5022 Chronic systolic (congestive) heart failure: Secondary | ICD-10-CM

## 2022-03-01 DIAGNOSIS — I10 Essential (primary) hypertension: Secondary | ICD-10-CM

## 2022-03-01 DIAGNOSIS — Z79899 Other long term (current) drug therapy: Secondary | ICD-10-CM | POA: Diagnosis not present

## 2022-03-01 DIAGNOSIS — N898 Other specified noninflammatory disorders of vagina: Secondary | ICD-10-CM | POA: Insufficient documentation

## 2022-03-01 DIAGNOSIS — I428 Other cardiomyopathies: Secondary | ICD-10-CM

## 2022-03-01 DIAGNOSIS — E782 Mixed hyperlipidemia: Secondary | ICD-10-CM

## 2022-03-01 MED ORDER — FLUCONAZOLE 150 MG PO TABS: 150.0000 mg | ORAL_TABLET | Freq: Every day | ORAL | 0 refills | Status: DC

## 2022-03-01 NOTE — Discharge Instructions (Addendum)
Take medication as prescribed. ?Your cytology results should be resulted within the next 48 to 72 hours.  If you have access to MyChart, you will also be able to see the results there. ?Follow-up if your symptoms do not improve.  ?Plan to see your gynecologist next month as scheduled. ?

## 2022-03-01 NOTE — ED Provider Notes (Signed)
Or [facility]    CSN: [id number] Arrival date & time: 03/01/22  6440      History   Chief Complaint No chief complaint on file.   HPI [name] [name] is a 61 y.o. female.   The patient is a 61 year old female who presents for vaginal discomfort.  Symptoms started approximately 2 weeks ago.  She thought symptoms went away, but 1 day ago, she noticed pain with urination.  She states it feels like something is "cut" and she notices that she has vaginal itching.  She denies vaginal odor or discharge.  She states that her partner cheated on her back in February.  She has not been sexually active since that time.  She denies abdominal pain, or urinary symptoms.  She would also like to be tested for any STIs at this time.  She states that she is scheduled to see her gynecologist in June, she was unable to get her appointment moved up.  She states that she was also told previously that she has a history of vaginal atrophy.  The history is provided by the patient.   Past Medical History:  Diagnosis Date   AICD (automatic cardioverter/defibrillator) present 09   [employer]- replaced 2016   Arthritis    Bradycardia    Breast cancer ([facility]) 02/24/2016   s/p lumpectomy and radiation but refused chemotherapy   Chronic systolic CHF (congestive heart failure) ([facility])    Cough due to angiotensin-converting enzyme inhibitor    Dizziness    Dizziness with standing after squatting, March, 2012   Dyslipidemia    LDL elevation,Patient does not want statin   Family history of breast cancer    GERD (gastroesophageal reflux disease)    H/O shortness of breath    CPX 10/18:  No evidence of cardiopulmonary limitation. Suspect exercise intolerance related to weight and deconditioning.    Hypothyroidism    ICD (implantable cardiac defibrillator) battery depletion    Dr. Graciela Husbands, June, 2009(artifactual atrial tachycardia response events addressed by reprogramming in parentheses   Leg swelling     right leg   Mitral regurgitation    Nonischemic cardiomyopathy ([facility])    Etiology unknown, diagnosed 2008   Personal history of radiation therapy 2017   Pneumonia due to COVID-19 virus 10/2019   TIA (transient ischemic attack)    No CT or MRI abnormality, aspirin therapy    Patient Active Problem List   Diagnosis Date Noted   NICM (nonischemic cardiomyopathy) (HCC) 07/23/2020   Pulmonary embolism ([facility]) 04/21/2020   Post-surgical hypothyroidism 03/27/2020   Lumbar arthropathy 07/15/2019   Anemia 07/15/2019   History of colonic polyps 05/07/2019   Genetic testing 11/22/2016   Family history of breast cancer    Breast cancer- radiation June 2016 08/05/2016   Abnormal stress test 08/01/2016   Cardiac LV ejection fraction 30-35% 07/18/2016   Arm numbness 03/14/2016   Facial numbness 03/14/2016   Breast cancer of upper-inner quadrant of left female breast (HCC) 03/09/2016   Chronic systolic CHF (congestive heart failure) ([facility]) 02/26/2014   Ejection fraction < 50%    Itching    Dizziness    Bradycardia    GERD (gastroesophageal reflux disease)    Hypothyroidism    Hyperlipidemia    Leg swelling    Cough due to angiotensin-converting enzyme inhibitor    Dual implantable cardioverter-defibrillator in situ    FOOT PAIN 01/20/2010   History of TIA 2007 01/19/2010    Past Surgical History:  Procedure Laterality Date  BREAST BIOPSY Left 02/24/2016   U/S Core   BREAST BIOPSY Left 02/24/2016   U/S Core   BREAST LUMPECTOMY Left 04/05/2016   invasive ductal    BREAST LUMPECTOMY WITH RADIOACTIVE SEED AND SENTINEL LYMPH NODE BIOPSY Left 04/05/2016   Procedure: BREAST LUMPECTOMY WITH RADIOACTIVE SEED AND SENTINEL LYMPH NODE BIOPSY;  Surgeon: [name], MD;  [facility]: [facility] OR;  Service: General;  Laterality: Left;   CARDIAC CATHETERIZATION N/A 08/05/2016   Procedure: Left Heart Cath and Coronary Angiography;  Surgeon: Peter M Swaziland, MD;  Location: [facility] INVASIVE CV LAB;  Service:  Cardiovascular;  Laterality: N/A;   CARDIAC DEFIBRILLATOR PLACEMENT  09   [employer] no remote   COLONOSCOPY     CYST EXCISION Left    back of leg-lt    IMPLANTABLE CARDIOVERTER DEFIBRILLATOR GENERATOR CHANGE N/A 12/29/2014   Procedure: IMPLANTABLE CARDIOVERTER DEFIBRILLATOR GENERATOR CHANGE;  Surgeon: [name], MD;  Location: [facility] CATH LAB;  Service: Cardiovascular;  Laterality: N/A;   LATERAL EPICONDYLE RELEASE  10/11/2012   Procedure: TENNIS ELBOW RELEASE;  Surgeon: Loreta Ave, MD;  Location: [facility];  Service: Orthopedics;  Laterality: Right;  RIGHT ELBOW: TENOTOMY ELBOW LATERAL EPICONDYLITIS TENNIS ELBOW: RADIAL TUNNEL RELEASE   PORT-A-CATH REMOVAL  04/13/2016   Procedure: MINOR REMOVAL PORT-A-CATH;  Surgeon: Manus Rudd, MD;  Location:  SURGERY CENTER;  Service: General;;   PORTACATH PLACEMENT Right 04/05/2016   Procedure: INSERTION PORT-A-CATH WITH [place];  Surgeon: [name], MD;  Location: [facility] OR;  Service: General;  Laterality: Right;  right IJ   TOTAL THYROIDECTOMY  2001   TUBAL LIGATION  94   UPPER GI ENDOSCOPY      OB History   No obstetric history on file.      Home Medications    Prior to Admission medications   Medication Sig Start Date End Date Taking? Authorizing Provider  fluconazole (DIFLUCAN) 150 MG tablet Take 1 tablet (150 mg total) by mouth daily. Take one tablet today, may repeat every 72 hours for up to 2 additional doses. 03/01/22  Yes Leath-Warren, Sadie Haber, NP  acetaminophen (TYLENOL) 500 MG tablet Take 500 mg by mouth every 6 (six) hours as needed for mild pain or moderate pain.    [provider]  acetaminophen-codeine (TYLENOL #3) 300-30 MG tablet Take 1 tablet by mouth every 8 (eight) hours as needed for severe pain. 01/05/22   [name], DO  albuterol (VENTOLIN HFA) 108 (90 Base) MCG/ACT inhaler Inhale 2 puffs into the lungs every 6 (six) hours as needed for wheezing or shortness of breath.  08/03/20   Jannifer Rodney A, FNP  Alpha-Lipoic Acid 600 MG CAPS Take 1 capsule (600 mg total) by mouth daily. 06/22/21   [name], DO  amoxicillin (AMOXIL) 500 MG capsule Take 500 mg by mouth 4 (four) times daily. 12/27/21   [provider]  carvedilol (COREG) 3.125 MG tablet Take 1 tablet (3.125 mg total) by mouth 2 (two) times daily. 06/22/21   [name], DO  dicyclomine (BENTYL) 10 MG capsule Take 10 mg by mouth 3 (three) times daily before meals.     [provider]  eplerenone (INSPRA) 25 MG tablet Take 1 tablet (25 mg total) by mouth daily. PLEASE CALL OFFICE TO ARRANGE FOLLOW UP APPOINTMENT 02/02/22   Bensimhon, Bevelyn Buckles, MD  gabapentin (NEURONTIN) 300 MG capsule Take 1 capsule by mouth daily. 01/23/21   [provider]  levothyroxine (SYNTHROID) 75 MCG tablet  TAKE 1 TABLET BY MOUTH ONCE DAILY BEFORE BREAKFAST 08/13/21   Delynn Flavin M, DO  nystatin (MYCOSTATIN) 100000 UNIT/ML suspension Take 5 mLs by mouth 4 (four) times daily. 01/02/22   [provider]  omeprazole (PRILOSEC) 40 MG capsule Take 40 mg by mouth daily.    [provider]  rosuvastatin (CRESTOR) 5 MG tablet Take 1 tablet (5 mg total) by mouth daily. Need to schedule follow up appointment for further refills. Thank you. [phone] 02/07/22   Meriam Sprague, MD  sacubitril-valsartan (ENTRESTO) 97-103 MG Take 1 tablet by mouth 2 (two) times daily. PLEASE CALL OFFICE TO ARRANGE FOLLOW UP APPOINTMENT 02/02/22   Bensimhon, Bevelyn Buckles, MD    Family History Family History  Problem Relation Age of Onset   Heart attack Mother    Cancer Mother        breast   Breast cancer Mother 20   Hypertension Sister    Hypertension Brother    Cancer Maternal Aunt        breast   Breast cancer Maternal Aunt    Cancer Paternal Grandmother        possible cancer, unknown type   Stroke Neg Hx    Diabetes Neg Hx     Social History Social History   Tobacco Use   Smoking  status: Never   Smokeless tobacco: Never  Vaping Use   Vaping Use: Never used  Substance Use Topics   Alcohol use: No   Drug use: No     Allergies   Avelox [moxifloxacin hcl in nacl], Clindamycin/lincomycin, Kiwi extract, Bactrim [sulfamethoxazole-trimethoprim], Levofloxacin, Simvastatin, and Spironolactone   Review of Systems Review of Systems  Gastrointestinal: Negative.   Genitourinary:  Positive for vaginal pain. Negative for dysuria, flank pain, pelvic pain, vaginal bleeding and vaginal discharge.  Skin: Negative.   Psychiatric/Behavioral: Negative.      Physical Exam Triage Vital Signs ED Triage Vitals [03/01/22 0855]  Enc Vitals Group     BP 109/74     Pulse Rate 75     Resp 18     Temp 97.9 F (36.6 C)     Temp Source Oral     SpO2 99 %     Weight      Height      Head Circumference      Peak Flow      Pain Score 7     Pain Loc      Pain Edu?      Excl. in GC?    No data found.  Updated Vital Signs BP 109/74 (BP Location: Right Arm)   Pulse 75   Temp 97.9 F (36.6 C) (Oral)   Resp 18   SpO2 99%   Visual Acuity Right Eye Distance:   Left Eye Distance:   Bilateral Distance:    Right Eye Near:   Left Eye Near:    Bilateral Near:     Physical Exam Vitals and nursing note reviewed.  Cardiovascular:     Rate and Rhythm: Normal rate and regular rhythm.  Pulmonary:     Effort: Pulmonary effort is normal.  Abdominal:     General: Bowel sounds are normal.     Palpations: Abdomen is soft.     Tenderness: There is no abdominal tenderness.  Genitourinary:    Exam position: Lithotomy position.     Pubic Area: No rash.      Labia:        Right: Tenderness present.  Urethra: No prolapse or urethral swelling.     Comments: Moderate yellow discharge seen on pelvic exam. No adnexal tenderness or CMT. Skin:    General: Skin is warm and dry.     Capillary Refill: Capillary refill takes less than 2 seconds.  Neurological:     General: No focal  deficit present.     Mental Status: She is oriented to person, place, and time.  Psychiatric:        Mood and Affect: Mood normal.        Behavior: Behavior normal.     UC Treatments / Results  Labs (all labs ordered are listed, but only abnormal results are displayed) Labs Reviewed  CERVICOVAGINAL ANCILLARY ONLY    EKG   Radiology No results found.  Procedures Procedures (including critical care time)  Medications Ordered in UC Medications - No data to display  Initial Impression / Assessment and Plan / UC Course  I have reviewed the triage vital signs and the nursing notes.  Pertinent labs & imaging results that were available during my care of the patient were reviewed by me and considered in my medical decision making (see chart for details).  The patient is a 61 year old female who presents with vaginal irritation.  Symptoms have been present for the past 2 weeks, but worsened 1 day ago.  Patient states that she feels like there is something "sticking" in her vagina.  She does admit that her partner was recently found cheating on her, but she denies any recent sexual intercourse.  Pelvic exam was done on the patient that did show moderate vaginal discharge.  There was no obvious trauma to the labia majora or minora.  No adnexal also or CMT tenderness on exam.  Discussion with patient regarding the possibility of atrophic vaginitis.  She states that she was told that she does have this.  Patient also would like STI testing given her partner's recent history of cheating on her.  We will start the patient on Diflucan to see if this helps with her vaginal itching and irritation.  Patient advised that she should follow-up if her symptoms do not improve.  She is scheduled to see her gynecologist next month.  Patient advised that she will be contacted if the STI results are positive, she can also see them on her MyChart account.  Follow-up as needed. Final Clinical Impressions(s) / UC  Diagnoses   Final diagnoses:  Vaginal irritation     Discharge Instructions      Take medication as prescribed. Your cytology results should be resulted within the next 48 to 72 hours.  If you have access to MyChart, you will also be able to see the results there. Follow-up if your symptoms do not improve.  Plan to see your gynecologist next month as scheduled.     ED Prescriptions     Medication Sig Dispense Auth. Provider   fluconazole (DIFLUCAN) 150 MG tablet Take 1 tablet (150 mg total) by mouth daily. Take one tablet today, may repeat every 72 hours for up to 2 additional doses. 3 tablet Leath-Warren, Sadie Haber, NP      PDMP not reviewed this encounter.   Abran Cantor, NP 03/01/22 1012

## 2022-03-01 NOTE — Progress Notes (Signed)
?Cardiology Office Note:   ? ?Date:  03/01/2022  ? ?ID:  [name] [name], DOB 26-Sep-1961, MRN [id number] ? ?PCP:  Janora Norlander, DO ?  ?[place]  ?Cardiologist:  Ena Dawley, MD  ?Advanced Practice Provider:  No care team member to display ?Electrophysiologist:  [name], MD  ? ?Referring MD: Janora Norlander, DO  ? ? ?History of Present Illness:   ? ?[name] [name] is a 61 y.o. female with a hx of breast cancer (s/p lumpectomy and XRT - refused chemo), hypothyroidism, HLD, TIA, GERD and systolic HF due to NICM with LVEF ~30% s/p AICD placement who was previously followed by Dr. [name] now returning to clinic for follow-up. ? ?She has long h/o NICM dating back to 2007. Cath in 2017 with normal cors and EF 25-30%. S/p s-ICD. Medical therapy has been limited by multiple medication intolerances (Spironolactone caused body burning sensation, could not afford Bidil and had fatigue with high does of Entresto). Has been followed Dr. [name] in HF clinic. Underwent CPX which revealed normal functional capacity. Was doing well on last visit with Dr. [name] on 10/14/20. ? ?Was last seen by Dr. [name] in 05/2021 where she was doing well. Was undergoing work-up for chronic cough. TTE 04/22 EF 03-54%, RV systolic fxn moderately reduced. Had lost 15lbs at that time.  ? ?Today, she feels overall okay. Continues to have dyspnea on exertion. Also notes intermittent episodes of dizziness when going from seated to standing. Symptoms resolve within a couple of seconds. Denies any episodes of low blood pressures at home. Wt has been stable at 200lbs. No LE edema, orthopnea, chest pain, or PND.  ? ?Has been dealing with chronic back pain which has limiting her mobility. She is also concerned about depression and is going to talk to her PCP to discuss further.  ?  ?The patient denies chest pain, nocturnal dyspnea, orthopnea or peripheral edema.  There have been no palpitations or syncope.   Complains of lightheadedness and dizziness.  ? ?Past Medical History:  ?Diagnosis Date  ? AICD (automatic cardioverter/defibrillator) present 09  ? [employer]- replaced 2016  ? Arthritis   ? Bradycardia   ? Breast cancer ([place]) 02/24/2016  ? s/p lumpectomy and radiation but refused chemotherapy  ? Chronic systolic CHF (congestive heart failure) ([place])   ? Cough due to angiotensin-converting enzyme inhibitor   ? Dizziness   ? Dizziness with standing after squatting, March, 2012  ? Dyslipidemia   ? LDL elevation,Patient does not want statin  ? Family history of breast cancer   ? GERD (gastroesophageal reflux disease)   ? H/O shortness of breath   ? CPX 10/18:  No evidence of cardiopulmonary limitation. Suspect exercise intolerance related to weight and deconditioning.   ? Hypothyroidism   ? ICD (implantable cardiac defibrillator) battery depletion   ? Dr. [name], June, 2009(artifactual atrial tachycardia response events addressed by reprogramming in parentheses  ? Leg swelling   ? right leg  ? Mitral regurgitation   ? Nonischemic cardiomyopathy ([place])   ? Etiology unknown, diagnosed 2008  ? Personal history of radiation therapy 2017  ? Pneumonia due to COVID-19 virus 10/2019  ? TIA (transient ischemic attack)   ? No CT or MRI abnormality, aspirin therapy  ? ? ?Past Surgical History:  ?Procedure Laterality Date  ? BREAST BIOPSY Left 02/24/2016  ? U/S Core  ? BREAST BIOPSY Left 02/24/2016  ? U/S Core  ? BREAST LUMPECTOMY Left 04/05/2016  ?  invasive ductal   ? BREAST LUMPECTOMY WITH RADIOACTIVE SEED AND SENTINEL LYMPH NODE BIOPSY Left 04/05/2016  ? Procedure: BREAST LUMPECTOMY WITH RADIOACTIVE SEED AND SENTINEL LYMPH NODE BIOPSY;  Surgeon: [name], MD;  Location: [facility];  Service: General;  Laterality: Left;  ? CARDIAC CATHETERIZATION N/A 08/05/2016  ? Procedure: Left Heart Cath and Coronary Angiography;  Surgeon: Peter M Martinique, MD;  Location: [facility];  Service: Cardiovascular;  Laterality: N/A;  ?  CARDIAC DEFIBRILLATOR PLACEMENT  09  ? [employer] no remote  ? COLONOSCOPY    ? CYST EXCISION Left   ? back of leg-lt   ? IMPLANTABLE CARDIOVERTER DEFIBRILLATOR GENERATOR CHANGE N/A 12/29/2014  ? Procedure: IMPLANTABLE CARDIOVERTER DEFIBRILLATOR GENERATOR CHANGE;  Surgeon: [name], MD;  Location: [facility] CATH [facility];  Service: Cardiovascular;  Laterality: N/A;  ? LATERAL EPICONDYLE RELEASE  10/11/2012  ? Procedure: TENNIS ELBOW RELEASE;  Surgeon: [name], MD;  Location: [facility];  Service: Orthopedics;  Laterality: Right;  RIGHT ELBOW: TENOTOMY ELBOW LATERAL EPICONDYLITIS TENNIS ELBOW: RADIAL TUNNEL RELEASE  ? PORT-A-CATH REMOVAL  04/13/2016  ? Procedure: MINOR REMOVAL PORT-A-CATH;  Surgeon: [name], MD;  Location: [facility];  Service: General;;  ? PORTACATH PLACEMENT Right 04/05/2016  ? Procedure: INSERTION PORT-A-CATH WITH [place];  Surgeon: [name], MD;  Location: [facility];  Service: General;  Laterality: Right;  right IJ  ? TOTAL THYROIDECTOMY  2001  ? TUBAL LIGATION  94  ? UPPER GI ENDOSCOPY    ? ? ?Current Medications: ?Current Meds  ?Medication Sig  ? acetaminophen (TYLENOL) 500 MG tablet Take 500 mg by mouth every 6 (six) hours as needed for mild pain or moderate pain.  ? acetaminophen-codeine (TYLENOL #3) 300-30 MG tablet Take 1 tablet by mouth every 8 (eight) hours as needed for severe pain.  ? albuterol (VENTOLIN HFA) 108 (90 Base) MCG/ACT inhaler Inhale 2 puffs into the lungs every 6 (six) hours as needed for wheezing or shortness of breath.  ? Alpha-Lipoic Acid 600 MG CAPS Take 1 capsule (600 mg total) by mouth daily.  ? carvedilol (COREG) 3.125 MG tablet Take 1 tablet (3.125 mg total) by mouth 2 (two) times daily.  ? dicyclomine (BENTYL) 10 MG capsule Take 10 mg by mouth 3 (three) times daily before meals.   ? eplerenone (INSPRA) 25 MG tablet Take 1 tablet (25 mg total) by mouth daily. PLEASE CALL OFFICE TO ARRANGE FOLLOW UP APPOINTMENT  ? fluconazole  (DIFLUCAN) 150 MG tablet Take 1 tablet (150 mg total) by mouth daily. Take one tablet today, may repeat every 72 hours for up to 2 additional doses.  ? gabapentin (NEURONTIN) 300 MG capsule Take 1 capsule by mouth daily.  ? levothyroxine (SYNTHROID) 75 MCG tablet TAKE 1 TABLET BY MOUTH ONCE DAILY BEFORE BREAKFAST  ? omeprazole (PRILOSEC) 40 MG capsule Take 40 mg by mouth daily.  ? rosuvastatin (CRESTOR) 5 MG tablet Take 1 tablet (5 mg total) by mouth daily. Need to schedule follow up appointment for further refills. Thank you. [phone]  ? sacubitril-valsartan (ENTRESTO) 97-103 MG Take 1 tablet by mouth 2 (two) times daily. PLEASE CALL OFFICE TO ARRANGE FOLLOW UP APPOINTMENT  ?  ? ?Allergies:   Avelox [moxifloxacin hcl in nacl], Clindamycin/lincomycin, Kiwi extract, Bactrim [sulfamethoxazole-trimethoprim], Levofloxacin, Simvastatin, and Spironolactone  ? ?Social History  ? ?Socioeconomic History  ? Marital status: Widowed  ?  Spouse name: Not on file  ? Number of children: Not on file  ?  Years of education: Not on file  ? Highest education level: Not on file  ?Occupational History  ? Occupation: Widow  ?Tobacco Use  ? Smoking status: Never  ? Smokeless tobacco: Never  ?Vaping Use  ? Vaping Use: Never used  ?Substance and Sexual Activity  ? Alcohol use: No  ? Drug use: No  ? Sexual activity: Not on file  ?Other Topics Concern  ? Not on file  ?Social History Narrative  ? Not on file  ? ?Social Determinants of Health  ? ?Financial Resource Strain: Not on file  ?Food Insecurity: Not on file  ?Transportation Needs: Not on file  ?Physical Activity: Not on file  ?Stress: Not on file  ?Social Connections: Not on file  ?  ? ?Family History: ?The patient's family history includes Breast cancer in her maternal aunt; Breast cancer (age of onset: 39) in her mother; Cancer in her maternal aunt, mother, and paternal grandmother; Heart attack in her mother; Hypertension in her brother and sister. There is no history of Stroke or  Diabetes. ? ?ROS:   ?Please see the history of present illness. ?(+) Cramps ?(+) Back Pain ?(+) Stress/anxiety ?(+) Depression ?(+) Lightheadedness ?(+) Dizziness ?All other systems are reviewed and negative.   ? ?

## 2022-03-01 NOTE — Patient Instructions (Signed)
Medication Instructions:  ? ?Your physician recommends that you continue on your current medications as directed. Please refer to the Current Medication list given to you today. ? ?*If you need a refill on your cardiac medications before your next appointment, please call your pharmacy* ? ? ?Lab Work: ? ?THIS WEEK OR NEXT WEEK HERE IN THE OFFICE--CHECK LIPIDS AND HEMOGLOBIN A1C--COME FASTING TO THIS APPOINTMENT ? ?If you have labs (blood work) drawn today and your tests are completely normal, you will receive your results only by: ?MyChart Message (if you have MyChart) OR ?A paper copy in the mail ?If you have any lab test that is abnormal or we need to change your treatment, we will call you to review the results. ? ? ?Follow-Up: ?At [facility], you and your health needs are our priority.  As part of our continuing mission to provide you with exceptional heart care, we have created designated Provider Care Teams.  These Care Teams include your primary Cardiologist (physician) and Advanced Practice Providers (APPs -  Physician Assistants and Nurse Practitioners) who all work together to provide you with the care you need, when you need it. ? ?We recommend signing up for the patient portal called "MyChart".  Sign up information is provided on this After Visit Summary.  MyChart is used to connect with patients for Virtual Visits (Telemedicine).  Patients are able to view lab/test results, encounter notes, upcoming appointments, etc.  Non-urgent messages can be sent to your provider as well.   ?To learn more about what you can do with MyChart, go to [email or web address].   ? ?Your next appointment:   ?6 month(s) ? ?The format for your next appointment:   ?In Person ? ?Provider:   ?DR. PEMBERTON  ? ?Important Information About Sugar ? ? ? ? ? ? ?

## 2022-03-01 NOTE — ED Triage Notes (Signed)
Pain and burning in vaginal area x 2 weeks  States she is not sexual active.  Denies shaving in that area and denies discharge ?

## 2022-03-02 LAB — CERVICOVAGINAL ANCILLARY ONLY
Bacterial Vaginitis (gardnerella): NEGATIVE
Candida Glabrata: NEGATIVE
Candida Vaginitis: NEGATIVE
Chlamydia: NEGATIVE
Comment: NEGATIVE
Comment: NEGATIVE
Comment: NEGATIVE
Comment: NEGATIVE
Comment: NEGATIVE
Comment: NORMAL
Neisseria Gonorrhea: NEGATIVE
Trichomonas: NEGATIVE

## 2022-03-04 ENCOUNTER — Encounter: Payer: Self-pay | Admitting: Family Medicine

## 2022-03-04 ENCOUNTER — Ambulatory Visit (INDEPENDENT_AMBULATORY_CARE_PROVIDER_SITE_OTHER): Payer: 59 | Admitting: Family Medicine

## 2022-03-04 DIAGNOSIS — J310 Chronic rhinitis: Secondary | ICD-10-CM | POA: Diagnosis not present

## 2022-03-04 DIAGNOSIS — R519 Headache, unspecified: Secondary | ICD-10-CM | POA: Diagnosis not present

## 2022-03-04 DIAGNOSIS — J329 Chronic sinusitis, unspecified: Secondary | ICD-10-CM | POA: Diagnosis not present

## 2022-03-04 MED ORDER — PREDNISONE 20 MG PO TABS: 20.0000 mg | ORAL_TABLET | Freq: Every day | ORAL | 0 refills | Status: AC

## 2022-03-04 MED ORDER — CEFDINIR 300 MG PO CAPS: 300.0000 mg | ORAL_CAPSULE | Freq: Two times a day (BID) | ORAL | 0 refills | Status: AC

## 2022-03-04 NOTE — Progress Notes (Signed)
Telephone visit ? ?Subjective: ?CC:?  Sinus infection ?PCP: Janora Norlander, DO ?XTG:GYIRS Jerilynn Mages Appleman is a 61 y.o. female calls for telephone consult today. Patient provides verbal consent for consult held via phone. ? ?Due to COVID-19 pandemic this visit was conducted virtually. This visit type was conducted due to national recommendations for restrictions regarding the COVID-19 Pandemic (e.g. social distancing, sheltering in place) in an effort to limit this patient's exposure and mitigate transmission in our community. All issues noted in this document were discussed and addressed.  A physical exam was not performed with this format.  ? ?Location of patient: home ?Location of provider: [facility] ?Others present for call: none ? ?1.  Sinusitis ?She spent some time outside Wednesday.  About 2 hours later the right side of her nose started burning and stuffy.  Ever since she has had a headache.  Reports pain radiates into right eye, temple and into the right teeth. No coughing or sneezing.  Has tested negative by home COVID test x2. No fever, no purulence from nares.  She is using Zyrtec and flonase.  Not helping at all.  She reports that headache has been pretty bad.  She does not report any associated blurred vision, dizziness, nausea or vomiting. ? ?ROS: Per HPI ? ?Allergies  ?Allergen Reactions  ? Avelox [Moxifloxacin Hcl In Nacl] Shortness Of Breath  ?  Shortness of breath  ? Clindamycin/Lincomycin Shortness Of Breath and Diarrhea  ?  Breathing/diarrhea ? ?  ? Kiwi Extract Anaphylaxis  ?  Makes pt. Feel like her throat is closing ?  ? Bactrim [Sulfamethoxazole-Trimethoprim] Swelling  ? Levofloxacin Other (See Comments)  ?  JOINT PAIN ?JOINT PAIN  ? Simvastatin Other (See Comments)  ?  Joint pain  ? Spironolactone Other (See Comments)  ?  burning  ? ?Past Medical History:  ?Diagnosis Date  ? AICD (automatic cardioverter/defibrillator) present 09  ? [employer]- replaced 2016  ? Arthritis   ? Bradycardia   ?  Breast cancer ([place]) 02/24/2016  ? s/p lumpectomy and radiation but refused chemotherapy  ? Chronic systolic CHF (congestive heart failure) ([place])   ? Cough due to angiotensin-converting enzyme inhibitor   ? Dizziness   ? Dizziness with standing after squatting, March, 2012  ? Dyslipidemia   ? LDL elevation,Patient does not want statin  ? Family history of breast cancer   ? GERD (gastroesophageal reflux disease)   ? H/O shortness of breath   ? CPX 10/18:  No evidence of cardiopulmonary limitation. Suspect exercise intolerance related to weight and deconditioning.   ? Hypothyroidism   ? ICD (implantable cardiac defibrillator) battery depletion   ? Dr. [name], June, 2009(artifactual atrial tachycardia response events addressed by reprogramming in parentheses  ? Leg swelling   ? right leg  ? Mitral regurgitation   ? Nonischemic cardiomyopathy ([place])   ? Etiology unknown, diagnosed 2008  ? Personal history of radiation therapy 2017  ? Pneumonia due to COVID-19 virus 10/2019  ? TIA (transient ischemic attack)   ? No CT or MRI abnormality, aspirin therapy  ? ? ?Current Outpatient Medications:  ?  acetaminophen (TYLENOL) 500 MG tablet, Take 500 mg by mouth every 6 (six) hours as needed for mild pain or moderate pain., Disp: , Rfl:  ?  acetaminophen-codeine (TYLENOL #3) 300-30 MG tablet, Take 1 tablet by mouth every 8 (eight) hours as needed for severe pain., Disp: 10 tablet, Rfl: 0 ?  albuterol (VENTOLIN HFA) 108 (90 Base) MCG/ACT inhaler, Inhale 2 puffs  into the lungs every 6 (six) hours as needed for wheezing or shortness of breath., Disp: 8 g, Rfl: 2 ?  Alpha-Lipoic Acid 600 MG CAPS, Take 1 capsule (600 mg total) by mouth daily., Disp: 90 capsule, Rfl: 3 ?  amoxicillin (AMOXIL) 500 MG capsule, Take 500 mg by mouth 4 (four) times daily., Disp: , Rfl:  ?  carvedilol (COREG) 3.125 MG tablet, Take 1 tablet (3.125 mg total) by mouth 2 (two) times daily., Disp: 180 tablet, Rfl: 3 ?  dicyclomine (BENTYL) 10 MG capsule, Take 10 mg  by mouth 3 (three) times daily before meals. , Disp: , Rfl:  ?  eplerenone (INSPRA) 25 MG tablet, Take 1 tablet (25 mg total) by mouth daily. PLEASE CALL OFFICE TO ARRANGE FOLLOW UP APPOINTMENT, Disp: 30 tablet, Rfl: 2 ?  fluconazole (DIFLUCAN) 150 MG tablet, Take 1 tablet (150 mg total) by mouth daily. Take one tablet today, may repeat every 72 hours for up to 2 additional doses., Disp: 3 tablet, Rfl: 0 ?  gabapentin (NEURONTIN) 300 MG capsule, Take 1 capsule by mouth daily., Disp: , Rfl:  ?  levothyroxine (SYNTHROID) 75 MCG tablet, TAKE 1 TABLET BY MOUTH ONCE DAILY BEFORE BREAKFAST, Disp: 90 tablet, Rfl: 2 ?  nystatin (MYCOSTATIN) 100000 UNIT/ML suspension, Take 5 mLs by mouth 4 (four) times daily., Disp: , Rfl:  ?  omeprazole (PRILOSEC) 40 MG capsule, Take 40 mg by mouth daily., Disp: , Rfl:  ?  rosuvastatin (CRESTOR) 5 MG tablet, Take 1 tablet (5 mg total) by mouth daily. Need to schedule follow up appointment for further refills. Thank you. [phone], Disp: 30 tablet, Rfl: 0 ?  sacubitril-valsartan (ENTRESTO) 97-103 MG, Take 1 tablet by mouth 2 (two) times daily. PLEASE CALL OFFICE TO ARRANGE FOLLOW UP APPOINTMENT, Disp: 60 tablet, Rfl: 0 ? ?Assessment/ Plan: ?60 y.o. female  ? ?Rhinosinusitis - Plan: predniSONE (DELTASONE) 20 MG tablet, cefdinir (OMNICEF) 300 MG capsule ? ?Sinus headache - Plan: predniSONE (DELTASONE) 20 MG tablet, cefdinir (OMNICEF) 300 MG capsule ? ?I favor that the headache is sinus in nature but we did discuss signs and symptoms that would be suggestive of aneurysm including severe thunderclap headache, which we discussed could be life-threatening.  We will trial her on some prednisone to see if this will alleviate some of the inflammatory changes that are ongoing and refractory to over-the-counter measures.  I have given her a pocket prescription for Omnicef to utilize should she develop any signs or symptoms that are concerning for acute bacterial sinusitis.  She understands plan and  reasons for emergent evaluation.  She will follow-up as needed ? ?Start time: 12:39pm ?End time: 12:45pm ? ?Total time spent on patient care (including telephone call/ virtual visit): 6 minutes ? ?Janora Norlander, DO ?[place] ?[phone] ? ?

## 2022-03-07 ENCOUNTER — Other Ambulatory Visit: Payer: 59 | Admitting: *Deleted

## 2022-03-07 DIAGNOSIS — E782 Mixed hyperlipidemia: Secondary | ICD-10-CM

## 2022-03-07 DIAGNOSIS — Z79899 Other long term (current) drug therapy: Secondary | ICD-10-CM

## 2022-03-07 DIAGNOSIS — I10 Essential (primary) hypertension: Secondary | ICD-10-CM

## 2022-03-07 LAB — LIPID PANEL
Chol/HDL Ratio: 2.1 ratio (ref 0.0–4.4)
Cholesterol, Total: 186 mg/dL (ref 100–199)
HDL: 88 mg/dL (ref >39)
LDL Chol Calc (NIH): 88 mg/dL (ref 0–99)
Triglycerides: 54 mg/dL (ref 0–149)
VLDL Cholesterol Cal: 10 mg/dL (ref 5–40)

## 2022-03-07 LAB — HEMOGLOBIN A1C
Est. average glucose Bld gHb Est-mCnc: 117 mg/dL
Hgb A1c MFr Bld: 5.7 % — ABNORMAL HIGH (ref 4.8–5.6)

## 2022-03-08 ENCOUNTER — Encounter: Payer: Self-pay | Admitting: Cardiology

## 2022-03-10 ENCOUNTER — Other Ambulatory Visit (HOSPITAL_COMMUNITY): Payer: Self-pay | Admitting: Internal Medicine

## 2022-03-21 ENCOUNTER — Other Ambulatory Visit: Payer: Self-pay | Admitting: Cardiology

## 2022-03-22 ENCOUNTER — Encounter: Payer: 59 | Admitting: Physician Assistant

## 2022-03-22 ENCOUNTER — Other Ambulatory Visit: Payer: Self-pay

## 2022-03-22 MED ORDER — ROSUVASTATIN CALCIUM 5 MG PO TABS: 5.0000 mg | ORAL_TABLET | Freq: Every day | ORAL | 11 refills | Status: DC

## 2022-03-25 ENCOUNTER — Encounter: Payer: 59 | Admitting: Adult Health

## 2022-04-03 NOTE — Progress Notes (Unsigned)
Cardiology Office Note Date:  04/04/2022  Patient ID:  [name] [name], [name] [name] Feb 25, 1961, MRN [id number] PCP:  Janora Norlander, DO  Cardiologist:  Dr. [name] > Dr. [name] Electrophysiologist: Dr. [name]   Chief Complaint: annual EP/device visit  History of Present Illness: [name] [name] is a 61 y.o. female with history of NICM, chronic CHF (systolic) w/ICD, hypothyroidism, TIA, breast Ca treated with lumpectomy/radiation tx (declined chemo), PE, NSVT  multiple medication intolerances, (Spironolactone caused "body burning," could not afford BiDil, questionable abdominal pain with Entresto?).  She comes in today to be seen for Dr. [name], last seen by him Oct 2021, doing well, no changes were made.  She saw Dr. [name] Aug 2022, she had lost weight, was exercising, had some DOE with hills or increased exertion. C/o burning in her legs Planned to avoid [place] given hx of infections.   Saw Dr. [name] 03/01/22, reported some orthostatic dizziness.  Some concerns of depression, pending d/w her PMD.  No changes were made.   TODAY No device concerns. No near syncope or syncope. She has a bad back, working through thee disability process because of it and her ability to exercise because if it is reduced, noted that she has started to gain weight back after her weight loss that she did have. She is not entirely sure, wonders of she may be retaining fluid, though her weight gain has been slow and steady, not abrupt and without change in any symptoms or her baseline exertional capacity.  She has sone SOB with exertion, but at her baseline for the last year or so, unchanged.    Device information: [employer]i dual chamber ICD, implanted 2009, gen change 2016, Dr. [name]   Past Medical History:  Diagnosis Date   AICD (automatic cardioverter/defibrillator) present 09   [employer]- replaced 2016   Arthritis    Bradycardia    Breast cancer ([place]) 02/24/2016   s/p lumpectomy and radiation  but refused chemotherapy   Chronic systolic CHF (congestive heart failure) (HCC)    Cough due to angiotensin-converting enzyme inhibitor    Dizziness    Dizziness with standing after squatting, March, 2012   Dyslipidemia    LDL elevation,Patient does not want statin   Family history of breast cancer    GERD (gastroesophageal reflux disease)    H/O shortness of breath    CPX 10/18:  No evidence of cardiopulmonary limitation. Suspect exercise intolerance related to weight and deconditioning.    Hypothyroidism    ICD (implantable cardiac defibrillator) battery depletion    Dr. [name], June, 2009(artifactual atrial tachycardia response events addressed by reprogramming in parentheses   Leg swelling    right leg   Mitral regurgitation    Nonischemic cardiomyopathy ([place])    Etiology unknown, diagnosed 2008   Personal history of radiation therapy 2017   Pneumonia due to COVID-19 virus 10/2019   TIA (transient ischemic attack)    No CT or MRI abnormality, aspirin therapy    Past Surgical History:  Procedure Laterality Date   BREAST BIOPSY Left 02/24/2016   U/S Core   BREAST BIOPSY Left 02/24/2016   U/S Core   BREAST LUMPECTOMY Left 04/05/2016   invasive ductal    BREAST LUMPECTOMY WITH RADIOACTIVE SEED AND SENTINEL LYMPH NODE BIOPSY Left 04/05/2016   Procedure: BREAST LUMPECTOMY WITH RADIOACTIVE SEED AND SENTINEL LYMPH NODE BIOPSY;  Surgeon: [name], MD;  Location: [place];  Service: General;  Laterality: Left;   CARDIAC CATHETERIZATION N/A 08/05/2016   Procedure:  Left Heart Cath and Coronary Angiography;  Surgeon: Peter M Martinique, MD;  Location: [place] [facility] CV [facility];  Service: Cardiovascular;  Laterality: N/A;   CARDIAC DEFIBRILLATOR PLACEMENT  09   [employer] no remote   COLONOSCOPY     CYST EXCISION Left    back of leg-lt    IMPLANTABLE CARDIOVERTER DEFIBRILLATOR GENERATOR CHANGE N/A 12/29/2014   Procedure: IMPLANTABLE CARDIOVERTER DEFIBRILLATOR GENERATOR CHANGE;  Surgeon:  [name], MD;  Location[facility] C[facility];  Service: Cardiovascular;  Laterality: N/A;   LATERAL EPICONDYLE RELEASE  10/11/2012   Procedure: TENNIS ELBOW RELEASE;  Surgeon: [name], MD;  Location: [place];  Service: Orthopedics;  Laterality: Right;  RIGHT ELBOW: TENOTOMY ELBOW LATERAL EPICONDYLITIS TENNIS ELBOW: RADIAL TUNNEL RELEASE   PORT-A-CATH REMOVAL  04/13/2016   Procedure: MINOR REMOVAL PORT-A-CATH;  Surgeon: [name], MD;  Location: [place];  Service: General;;   PORTACATH PLACEMENT Right 04/05/2016   Procedure: INSERTION PORT-A-CATH WITH [place];  Surgeon: [name], MD;  Location: [place];  Service: General;  Laterality: Right;  right IJ   TOTAL THYROIDECTOMY  2001   TUBAL LIGATION  94   UPPER GI ENDOSCOPY      Current Outpatient Medications  Medication Sig Dispense Refill   acetaminophen (TYLENOL) 500 MG tablet Take 500 mg by mouth every 6 (six) hours as needed for mild pain or moderate pain.     acetaminophen-codeine (TYLENOL #3) 300-30 MG tablet Take 1 tablet by mouth every 8 (eight) hours as needed for severe pain. 10 tablet 0   albuterol (VENTOLIN HFA) 108 (90 Base) MCG/ACT inhaler Inhale 2 puffs into the lungs every 6 (six) hours as needed for wheezing or shortness of breath. 8 g 2   Alpha-Lipoic Acid 600 MG CAPS Take 1 capsule (600 mg total) by mouth daily. 90 capsule 3   carvedilol (COREG) 3.125 MG tablet Take 1 tablet (3.125 mg total) by mouth 2 (two) times daily. 180 tablet 3   dicyclomine (BENTYL) 10 MG capsule Take 10 mg by mouth 3 (three) times daily before meals.      ENTRESTO 97-103 MG TAKE 1 TABLET BY MOUTH TWICE DAILY . APPOINTMENT REQUIRED FOR FUTURE REFILLS 60 tablet 0   eplerenone (INSPRA) 25 MG tablet Take 1 tablet (25 mg total) by mouth daily. PLEASE CALL OFFICE TO ARRANGE FOLLOW UP APPOINTMENT 30 tablet 2   gabapentin (NEURONTIN) 300 MG capsule Take 1 capsule by mouth daily.     levothyroxine (SYNTHROID) 75 MCG tablet  TAKE 1 TABLET BY MOUTH ONCE DAILY BEFORE BREAKFAST 90 tablet 2   omeprazole (PRILOSEC) 40 MG capsule Take 40 mg by mouth daily.     rosuvastatin (CRESTOR) 5 MG tablet Take 1 tablet (5 mg total) by mouth daily. 30 tablet 11   No current facility-administered medications for this visit.    Allergies:   Avelox [moxifloxacin hcl in nacl], Clindamycin/lincomycin, Kiwi extract, Bactrim [sulfamethoxazole-trimethoprim], Levofloxacin, Simvastatin, and Spironolactone   Social History:  The patient  reports that she has never smoked. She has never used smokeless tobacco. She reports that she does not drink alcohol and does not use drugs.   Family History:  The patient's family history includes Breast cancer in her maternal aunt; Breast cancer (age of onset: 64) in her mother; Cancer in her maternal aunt, mother, and paternal grandmother; Heart attack in her mother; Hypertension in her brother and sister.  ROS:  Please see the history of present illness.  All other  systems are reviewed and otherwise negative.   PHYSICAL EXAM:  VS:  BP 118/84   Pulse 64   Ht '5\' 8"'$  (1.727 m)   Wt 205 lb (93 kg)   SpO2 97%   BMI 31.17 kg/m  BMI: Body mass index is 31.17 kg/m. Well nourished, well developed, in no acute distress  HEENT: normocephalic, atraumatic  Neck: no JVD, carotid bruits or masses Cardiac:   RRR; no significant murmurs, no rubs, or gallops Lungs:  CTA b/l, no wheezing, rhonchi or rales  Abd: soft, nontender MS: no deformity or  atrophy Ext: no edema  Skin: warm and dry, no rash Neuro:  No gross deficits appreciated Psych: euthymic mood, full affect  ICD site is stable, no tethering or discomfort   EKG:  Not done today  ICD interrogation done today and reviewed by myself:  Battery and lead measurements are good Some NSVTs, infrequently AP1% VP<1%  TTE 01/2021: IMPRESSIONS   1. Left ventricular ejection fraction, by estimation, is 30 to 35%. The  left ventricle has moderately  decreased function. The left ventricle has  no regional wall motion abnormalities. The left ventricular internal  cavity size was mildly dilated. Left  ventricular diastolic parameters are consistent with Grade I diastolic  dysfunction (impaired relaxation).   2. Right ventricular systolic function is moderately reduced. The right  ventricular size is normal. There is normal pulmonary artery systolic  pressure.   3. Left atrial size was mildly dilated.   4. The mitral valve is normal in structure. Trivial mitral valve  regurgitation. No evidence of mitral stenosis.   5. The aortic valve is normal in structure. Aortic valve regurgitation is  not visualized. No aortic stenosis is present.   6. The inferior vena cava is normal in size with greater than 50%  respiratory variability, suggesting right atrial pressure of 3 mmHg.    TTE 04/22/20:  1. Left ventricular ejection fraction, by estimation, is 35 to 40%. The  left ventricle has normal function. The left ventricle has no regional  wall motion abnormalities. Left ventricular diastolic parameters are  indeterminate.   2. Right ventricular systolic function is normal. The right ventricular  size is normal.   3. The mitral valve is normal in structure. No evidence of mitral valve  regurgitation. No evidence of mitral stenosis.   4. The aortic valve is tricuspid. Aortic valve regurgitation is not  visualized. No aortic stenosis is present.   5. The inferior vena cava is normal in size with greater than 50%  respiratory variability, suggesting right atrial pressure of 3 mmHg.    12/19/2019: CPX Conclusion: Exercise testing with gas exchange demonstrates normal functional capacity when compared to matched sedentary norms. There is no clear cardiopulmonary limitation. With corrections for ideal body weight, patient's body habitus appears the limiting factor. Overall CPX essentially unchanged from 2018.   Cardiac Catheterization  08/05/16 Normal coronary arteries EF 25-35  Nuclear stress test 07/27/16 Intermediate risk stress nuclear study with mild inferior ischemia; EF 39 with global hypokinesis and moderate LVE.   Echocardiogram 07/13/16 EF 30, diffuse HK, grade 1 diastolic dysfunction, mild MR, mildly reduced RVSF, PASP 19   Echocardiogram 11/28/15 EF 30-35, diffuse HK, mild MVP involving anterior leaflet, mild MR  Recent Labs: 06/17/2021: B Natriuretic Peptide 36.9 06/22/2021: TSH 1.820 08/24/2021: BUN 10; Creatinine, Ser 0.82; Hemoglobin 11.8; Platelets 204; Potassium 4.2; Sodium 140  03/07/2022: Chol/HDL Ratio 2.1; Cholesterol, Total 186; HDL 88; LDL Chol Calc (NIH) 88; Triglycerides 54  CrCl cannot be calculated (Patient's most recent lab result is older than the maximum 21 days allowed.).   Wt Readings from Last 3 Encounters:  04/04/22 205 lb (93 kg)  03/01/22 200 lb 12.8 oz (91.1 kg)  01/20/22 200 lb 3.2 oz (90.8 kg)     Other studies reviewed: Additional studies/records reviewed today include: summarized above  ASSESSMENT AND PLAN:  1. ICD     Intact function, no changes made       2. NICM, chronic CHF (systolic)     Exam does not suggest fluid OL      On BB/Entresto, eplerenone     She is due to see Dr. [name]    Disposition: we can continue to follow remotes as usual and in clinic with EP in a year, sooner if needed  Current medicines are reviewed at length with the patient today.  The patient did not have any concerns regarding medicines.  Venetia Night, PA-C 04/04/2022 10:06 AM     [facility] [place] [address] [phone] (office)  [phone] (fax)

## 2022-04-04 ENCOUNTER — Encounter: Payer: Self-pay | Admitting: Physician Assistant

## 2022-04-04 ENCOUNTER — Ambulatory Visit (INDEPENDENT_AMBULATORY_CARE_PROVIDER_SITE_OTHER): Payer: 59 | Admitting: Physician Assistant

## 2022-04-04 VITALS — BP 118/84 | HR 64 | Ht 68.0 in | Wt 205.0 lb

## 2022-04-04 DIAGNOSIS — Z95810 Presence of automatic (implantable) cardiac defibrillator: Secondary | ICD-10-CM | POA: Diagnosis not present

## 2022-04-04 DIAGNOSIS — Z79899 Other long term (current) drug therapy: Secondary | ICD-10-CM | POA: Diagnosis not present

## 2022-04-04 DIAGNOSIS — R001 Bradycardia, unspecified: Secondary | ICD-10-CM | POA: Diagnosis not present

## 2022-04-04 DIAGNOSIS — I5022 Chronic systolic (congestive) heart failure: Secondary | ICD-10-CM | POA: Diagnosis not present

## 2022-04-04 LAB — BASIC METABOLIC PANEL
BUN/Creatinine Ratio: 14 (ref 12–28)
BUN: 12 mg/dL (ref 8–27)
CO2: 28 mmol/L (ref 20–29)
Calcium: 9.4 mg/dL (ref 8.7–10.3)
Chloride: 106 mmol/L (ref 96–106)
Creatinine, Ser: 0.86 mg/dL (ref 0.57–1.00)
Glucose: 84 mg/dL (ref 70–99)
Potassium: 4.4 mmol/L (ref 3.5–5.2)
Sodium: 144 mmol/L (ref 134–144)
eGFR: 77 mL/min/{1.73_m2} (ref >59)

## 2022-04-04 LAB — CUP PACEART INCLINIC DEVICE CHECK
Date Time Interrogation Session: 20230612130631
Implantable Lead Implant Date: 20090320
Implantable Lead Implant Date: 20090320
Implantable Lead Location: 753859
Implantable Lead Location: 753860
Implantable Lead Model: 158
Implantable Lead Model: 5076
Implantable Lead Serial Number: 182504
Implantable Pulse Generator Implant Date: 20160307
Lead Channel Pacing Threshold Amplitude: 0.5 V
Lead Channel Pacing Threshold Amplitude: 1.2 V
Lead Channel Pacing Threshold Pulse Width: 0.4 ms
Lead Channel Pacing Threshold Pulse Width: 0.4 ms
Lead Channel Sensing Intrinsic Amplitude: 2 mV
Lead Channel Sensing Intrinsic Amplitude: 21 mV
Pulse Gen Serial Number: 111826

## 2022-04-04 NOTE — Patient Instructions (Signed)
Medication Instructions:   Your physician recommends that you continue on your current medications as directed. Please refer to the Current Medication list given to you today.   *If you need a refill on your cardiac medications before your next appointment, please call your pharmacy*   Lab Work:  BMET   If you have labs (blood work) drawn today and your tests are completely normal, you will receive your results only by: [place] (if you have MyChart) OR A paper copy in the mail If you have any lab test that is abnormal or we need to change your treatment, we will call you to review the results.   Testing/Procedures: NONE ORDERED  TODAY    Follow-Up: At [facility], you and your health needs are our priority.  As part of our continuing mission to provide you with exceptional heart care, we have created designated Provider Care Teams.  These Care Teams include your primary Cardiologist (physician) and Advanced Practice Providers (APPs -  Physician Assistants and Nurse Practitioners) who all work together to provide you with the care you need, when you need it.  We recommend signing up for the patient portal called "MyChart".  Sign up information is provided on this After Visit Summary.  MyChart is used to connect with patients for Virtual Visits (Telemedicine).  Patients are able to view lab/test results, encounter notes, upcoming appointments, etc.  Non-urgent messages can be sent to your provider as well.   To learn more about what you can do with MyChart, go to [email or web address].    Your next appointment:  NEXT AVAILABLE WITH DR BENSIMHON/APP 1 year(s)  The format for your next appointment:   In Person  Provider:   You may see [name], MD or one of the following Advanced Practice Providers on your designated Care Team:   Tommye Standard, [place] Legrand Como "Jonni Sanger" Chalmers Cater, [place]    Other Instructions   Important Information About [place]

## 2022-04-07 ENCOUNTER — Other Ambulatory Visit (HOSPITAL_COMMUNITY): Payer: Self-pay | Admitting: Internal Medicine

## 2022-04-11 ENCOUNTER — Encounter (HOSPITAL_COMMUNITY): Payer: Self-pay

## 2022-04-12 ENCOUNTER — Other Ambulatory Visit (HOSPITAL_COMMUNITY): Payer: Self-pay | Admitting: *Deleted

## 2022-04-12 ENCOUNTER — Encounter (HOSPITAL_COMMUNITY): Payer: Self-pay

## 2022-04-12 ENCOUNTER — Emergency Department (HOSPITAL_COMMUNITY): Payer: 59

## 2022-04-12 ENCOUNTER — Other Ambulatory Visit: Payer: Self-pay

## 2022-04-12 ENCOUNTER — Observation Stay (HOSPITAL_COMMUNITY)
Admission: EM | Admit: 2022-04-12 | Discharge: 2022-04-13 | Disposition: A | Discharge status: Home / Self Care | Payer: 59 | Attending: Family Medicine | Admitting: Family Medicine

## 2022-04-12 DIAGNOSIS — E039 Hypothyroidism, unspecified: Secondary | ICD-10-CM | POA: Diagnosis not present

## 2022-04-12 DIAGNOSIS — Z79899 Other long term (current) drug therapy: Secondary | ICD-10-CM | POA: Insufficient documentation

## 2022-04-12 DIAGNOSIS — Z6830 Body mass index (BMI) 30.0-30.9, adult: Secondary | ICD-10-CM | POA: Insufficient documentation

## 2022-04-12 DIAGNOSIS — I82431 Acute embolism and thrombosis of right popliteal vein: Secondary | ICD-10-CM | POA: Diagnosis not present

## 2022-04-12 DIAGNOSIS — Z8673 Personal history of transient ischemic attack (TIA), and cerebral infarction without residual deficits: Secondary | ICD-10-CM | POA: Diagnosis not present

## 2022-04-12 DIAGNOSIS — E7800 Pure hypercholesterolemia, unspecified: Secondary | ICD-10-CM

## 2022-04-12 DIAGNOSIS — I2699 Other pulmonary embolism without acute cor pulmonale: Principal | ICD-10-CM | POA: Diagnosis present

## 2022-04-12 DIAGNOSIS — E782 Mixed hyperlipidemia: Secondary | ICD-10-CM

## 2022-04-12 DIAGNOSIS — Z853 Personal history of malignant neoplasm of breast: Secondary | ICD-10-CM | POA: Insufficient documentation

## 2022-04-12 DIAGNOSIS — E669 Obesity, unspecified: Secondary | ICD-10-CM

## 2022-04-12 DIAGNOSIS — K219 Gastro-esophageal reflux disease without esophagitis: Secondary | ICD-10-CM | POA: Diagnosis present

## 2022-04-12 DIAGNOSIS — Z95810 Presence of automatic (implantable) cardiac defibrillator: Secondary | ICD-10-CM | POA: Insufficient documentation

## 2022-04-12 DIAGNOSIS — E785 Hyperlipidemia, unspecified: Secondary | ICD-10-CM

## 2022-04-12 DIAGNOSIS — Z8616 Personal history of COVID-19: Secondary | ICD-10-CM | POA: Diagnosis not present

## 2022-04-12 DIAGNOSIS — R0789 Other chest pain: Secondary | ICD-10-CM | POA: Diagnosis present

## 2022-04-12 DIAGNOSIS — I5042 Chronic combined systolic (congestive) and diastolic (congestive) heart failure: Secondary | ICD-10-CM

## 2022-04-12 DIAGNOSIS — I5022 Chronic systolic (congestive) heart failure: Secondary | ICD-10-CM

## 2022-04-12 HISTORY — DX: Other pulmonary embolism without acute cor pulmonale: I26.99

## 2022-04-12 LAB — BASIC METABOLIC PANEL
Anion gap: 6 (ref 5–15)
BUN: 12 mg/dL (ref 8–23)
CO2: 26 mmol/L (ref 22–32)
Calcium: 9.3 mg/dL (ref 8.9–10.3)
Chloride: 107 mmol/L (ref 98–111)
Creatinine, Ser: 0.86 mg/dL (ref 0.44–1.00)
GFR, Estimated: >60 mL/min (ref >60)
Glucose, Bld: 94 mg/dL (ref 70–99)
Potassium: 4.1 mmol/L (ref 3.5–5.1)
Sodium: 139 mmol/L (ref 135–145)

## 2022-04-12 LAB — CBC
HCT: 35.8 % — ABNORMAL LOW (ref 36.0–46.0)
Hemoglobin: 11.4 g/dL — ABNORMAL LOW (ref 12.0–15.0)
MCH: 30.6 pg (ref 26.0–34.0)
MCHC: 31.8 g/dL (ref 30.0–36.0)
MCV: 96.2 fL (ref 80.0–100.0)
Platelets: 232 10*3/uL (ref 150–400)
RBC: 3.72 MIL/uL — ABNORMAL LOW (ref 3.87–5.11)
RDW: 12.5 % (ref 11.5–15.5)
WBC: 5 10*3/uL (ref 4.0–10.5)
nRBC: 0 % (ref 0.0–0.2)

## 2022-04-12 LAB — D-DIMER, QUANTITATIVE: D-Dimer, Quant: 1.2 ug/mL-FEU — ABNORMAL HIGH (ref 0.00–0.50)

## 2022-04-12 LAB — TROPONIN I (HIGH SENSITIVITY)
Troponin I (High Sensitivity): 3 ng/L (ref <18)
Troponin I (High Sensitivity): 3 ng/L (ref <18)

## 2022-04-12 MED ORDER — ACETAMINOPHEN 325 MG PO TABS
650.0000 mg | ORAL_TABLET | Freq: Four times a day (QID) | ORAL | Status: DC | PRN
  Administered 2022-04-13 (×3): 650 mg via ORAL
  Filled 2022-04-12 (×3): qty 2

## 2022-04-12 MED ORDER — APIXABAN 5 MG PO TABS: 5.0000 mg | ORAL_TABLET | Freq: Two times a day (BID) | ORAL | Status: DC

## 2022-04-12 MED ORDER — ONDANSETRON HCL 4 MG/2ML IJ SOLN: 4.0000 mg | Freq: Four times a day (QID) | INTRAMUSCULAR | Status: DC | PRN

## 2022-04-12 MED ORDER — IOHEXOL 350 MG/ML SOLN
100.0000 mL | Freq: Once | INTRAVENOUS | Status: AC | PRN
  Administered 2022-04-12: 100 mL via INTRAVENOUS

## 2022-04-12 MED ORDER — ONDANSETRON HCL 4 MG PO TABS: 4.0000 mg | ORAL_TABLET | Freq: Four times a day (QID) | ORAL | Status: DC | PRN

## 2022-04-12 MED ORDER — ACETAMINOPHEN 650 MG RE SUPP: 650.0000 mg | Freq: Four times a day (QID) | RECTAL | Status: DC | PRN

## 2022-04-12 MED ORDER — APIXABAN 5 MG PO TABS
10.0000 mg | ORAL_TABLET | Freq: Two times a day (BID) | ORAL | Status: DC
  Administered 2022-04-13 (×3): 10 mg via ORAL
  Filled 2022-04-12 (×3): qty 2

## 2022-04-12 NOTE — H&P (Incomplete)
History and Physical    Patient: [name] [name] GYI:[id number] DOB: November 18, 1960 DOA: 04/12/2022 DOS: the patient was seen and examined on 04/13/2022 PCP: Janora Norlander, DO  Patient coming from: Home  Chief Complaint:  Chief Complaint  Patient presents with   Chest Pain   HPI: [name] [name] is a 61 y.o. female with medical history significant of chronic systolic CHF, hypothyroidism, GERD, hyperlipidemia, breast cancer status post lumpectomy and radiation (May 2017), ICD placement who presents to the emergency department due to 1 week onset of chest pain and shortness of breath which has progressively worsened within the last 3 to 4 days, she complained of nonproductive cough.  Chest pain was described as pressure/tightness and it worsens with physical activity.  Patient states symptoms were similar to when she was diagnosed with PE in 2021 and she was placed on Eliquis for 1 year (currently not on Eliquis).  Patient states that since she ate banana this morning, her symptoms have since worsened.  She denies headache, fever, chills, vomiting, abdominal pain.    ED Course: In the emergency department, she was initially bradycardic with HR ranging from 55-59, but is subsequently improved into the 60s, other vital signs were within normal range.  Work-up in the ED showed normocytic anemia, normal BMP, troponin x2 was negative, D-dimer 1.20. CT angiography of the chest with contrast showed small pulmonary emboli within the right lower lobe as described. No right heart strain is noted Chest x-ray showed no active cardiopulmonary disease Patient was started on Eliquis, hospitalist was asked to admit patient for further evaluation and management.  Review of Systems: Review of systems as noted in the HPI. All other systems reviewed and are negative.   Past Medical History:  Diagnosis Date   AICD (automatic cardioverter/defibrillator) present 09   [employer]- replaced 2016   Arthritis     Bradycardia    Breast cancer ([place]) 02/24/2016   s/p lumpectomy and radiation but refused chemotherapy   Chronic systolic CHF (congestive heart failure) (HCC)    Cough due to angiotensin-converting enzyme inhibitor    Dizziness    Dizziness with standing after squatting, March, 2012   Dyslipidemia    LDL elevation,Patient does not want statin   Family history of breast cancer    GERD (gastroesophageal reflux disease)    H/O shortness of breath    CPX 10/18:  No evidence of cardiopulmonary limitation. Suspect exercise intolerance related to weight and deconditioning.    Hypothyroidism    ICD (implantable cardiac defibrillator) battery depletion    Dr. [name], June, 2009(artifactual atrial tachycardia response events addressed by reprogramming in parentheses   Leg swelling    right leg   Mitral regurgitation    Nonischemic cardiomyopathy ([place])    Etiology unknown, diagnosed 2008   Personal history of radiation therapy 2017   Pneumonia due to COVID-19 virus 10/2019   TIA (transient ischemic attack)    No CT or MRI abnormality, aspirin therapy   Past Surgical History:  Procedure Laterality Date   BREAST BIOPSY Left 02/24/2016   U/S Core   BREAST BIOPSY Left 02/24/2016   U/S Core   BREAST LUMPECTOMY Left 04/05/2016   invasive ductal    BREAST LUMPECTOMY WITH RADIOACTIVE SEED AND SENTINEL LYMPH NODE BIOPSY Left 04/05/2016   Procedure: BREAST LUMPECTOMY WITH RADIOACTIVE SEED AND SENTINEL LYMPH NODE BIOPSY;  Surgeon: [name], MD;  Location: [facility];  Service: General;  Laterality: Left;   CARDIAC CATHETERIZATION N/A 08/05/2016  Procedure: Left Heart Cath and Coronary Angiography;  Surgeon: Peter M Martinique, MD;  Location: [facility];  Service: Cardiovascular;  Laterality: N/A;   CARDIAC DEFIBRILLATOR PLACEMENT  09   [employer] no remote   COLONOSCOPY     CYST EXCISION Left    back of leg-lt    IMPLANTABLE CARDIOVERTER DEFIBRILLATOR GENERATOR CHANGE N/A 12/29/2014    Procedure: IMPLANTABLE CARDIOVERTER DEFIBRILLATOR GENERATOR CHANGE;  Surgeon: [name], MD;  Location: [facility] CATH [facility];  Service: Cardiovascular;  Laterality: N/A;   LATERAL EPICONDYLE RELEASE  10/11/2012   Procedure: TENNIS ELBOW RELEASE;  Surgeon: [name], MD;  Location: [place];  Service: Orthopedics;  Laterality: Right;  RIGHT ELBOW: TENOTOMY ELBOW LATERAL EPICONDYLITIS TENNIS ELBOW: RADIAL TUNNEL RELEASE   PORT-A-CATH REMOVAL  04/13/2016   Procedure: MINOR REMOVAL PORT-A-CATH;  Surgeon: [name], MD;  Location: [place];  Service: General;;   PORTACATH PLACEMENT Right 04/05/2016   Procedure: INSERTION PORT-A-CATH WITH [place];  Surgeon: [name], MD;  Location: [place];  Service: General;  Laterality: Right;  right IJ   TOTAL THYROIDECTOMY  2001   TUBAL LIGATION  94   UPPER GI ENDOSCOPY      Social History:  reports that she has never smoked. She has never used smokeless tobacco. She reports that she does not drink alcohol and does not use drugs.   Allergies  Allergen Reactions   Avelox [Moxifloxacin Hcl In Nacl] Shortness Of Breath    Shortness of breath   Clindamycin/Lincomycin Shortness Of Breath and Diarrhea    Breathing/diarrhea     Kiwi Extract Anaphylaxis    Makes pt. Feel like her throat is closing    Bactrim [Sulfamethoxazole-Trimethoprim] Swelling   Levofloxacin Other (See Comments)    JOINT PAIN JOINT PAIN   Simvastatin Other (See Comments)    Joint pain   Spironolactone Other (See Comments)    burning    Family History  Problem Relation Age of Onset   Heart attack Mother    Cancer Mother        breast   Breast cancer Mother 19   Hypertension Sister    Hypertension Brother    Cancer Maternal Aunt        breast   Breast cancer Maternal Aunt    Cancer Paternal Grandmother        possible cancer, unknown type   Stroke Neg Hx    Diabetes Neg Hx      Prior to Admission medications   Medication Sig Start Date  End Date Taking? Authorizing Provider  acetaminophen (TYLENOL) 500 MG tablet Take 500 mg by mouth every 6 (six) hours as needed for mild pain or moderate pain.   Yes [provider]  albuterol (VENTOLIN HFA) 108 (90 Base) MCG/ACT inhaler Inhale 2 puffs into the lungs every 6 (six) hours as needed for wheezing or shortness of breath. 08/03/20  Yes Hawks, Christy A, FNP  carvedilol (COREG) 3.125 MG tablet Take 1 tablet (3.125 mg total) by mouth 2 (two) times daily. 06/22/21  Yes Ronnie Doss M, DO  dicyclomine (BENTYL) 10 MG capsule Take 10 mg by mouth 3 (three) times daily before meals.    Yes [provider]  ENTRESTO 97-103 MG TAKE 1 TABLET BY MOUTH TWICE DAILY . APPOINTMENT REQUIRED FOR FUTURE REFILLS Patient taking differently: Take 1 tablet by mouth 2 (two) times daily. 04/07/22  Yes Bensimhon, Shaune Pascal, MD  eplerenone (INSPRA) 25 MG tablet Take  1 tablet (25 mg total) by mouth daily. PLEASE CALL OFFICE TO ARRANGE FOLLOW UP APPOINTMENT 02/02/22  Yes Bensimhon, Shaune Pascal, MD  levothyroxine (SYNTHROID) 75 MCG tablet TAKE 1 TABLET BY MOUTH ONCE DAILY BEFORE BREAKFAST 08/13/21  Yes Gottschalk, Ashly M, DO  omeprazole (PRILOSEC) 40 MG capsule Take 40 mg by mouth daily.   Yes [provider]  rosuvastatin (CRESTOR) 5 MG tablet Take 1 tablet (5 mg total) by mouth daily. 03/22/22  Yes Freada Bergeron, MD  acetaminophen-codeine (TYLENOL #3) 300-30 MG tablet Take 1 tablet by mouth every 8 (eight) hours as needed for severe pain. Patient not taking: Reported on 04/12/2022 01/05/22   Janora Norlander, DO    Physical Exam: BP 126/85 (BP Location: Right Arm)   Pulse 74   Temp 98.3 F (36.8 C) (Oral)   Resp 18   Ht '5\' 8"'$  (1.727 m)   Wt 90.7 kg   SpO2 100%   BMI 30.41 kg/m   General: 61 y.o. year-old female well developed well nourished in no acute distress.  Alert and oriented x3. HEENT: NCAT, EOMI Neck: Supple, trachea medial Cardiovascular: Regular rate and rhythm  with no rubs or gallops.  No thyromegaly or JVD noted.  No lower extremity edema. 2/4 pulses in all 4 extremities. Respiratory: Clear to auscultation with no wheezes or rales. Good inspiratory effort. Abdomen: Soft, nontender nondistended with normal bowel sounds x4 quadrants. Muskuloskeletal: No cyanosis, clubbing or edema noted bilaterally Neuro: CN II-XII intact, strength 5/5 x 4, sensation, reflexes intact Skin: No ulcerative lesions noted or rashes Psychiatry: Judgement and insight appear normal. Mood is appropriate for condition and setting          Labs on Admission:  Basic Metabolic Panel: Recent Labs  Lab 04/12/22 1704  NA 139  K 4.1  CL 107  CO2 26  GLUCOSE 94  BUN 12  CREATININE 0.86  CALCIUM 9.3   Liver Function Tests: No results for input(s): "AST", "ALT", "ALKPHOS", "BILITOT", "PROT", "ALBUMIN" in the last 168 hours. No results for input(s): "LIPASE", "AMYLASE" in the last 168 hours. No results for input(s): "AMMONIA" in the last 168 hours. CBC: Recent Labs  Lab 04/12/22 1704  WBC 5.0  HGB 11.4*  HCT 35.8*  MCV 96.2  PLT 232   Cardiac Enzymes: No results for input(s): "CKTOTAL", "CKMB", "CKMBINDEX", "TROPONINI" in the last 168 hours.  BNP (last 3 results) Recent Labs    06/17/21 1513  BNP 36.9    ProBNP (last 3 results) No results for input(s): "PROBNP" in the last 8760 hours.  CBG: No results for input(s): "GLUCAP" in the last 168 hours.  Radiological Exams on Admission: CT Angio Chest PE W and/or Wo Contrast  Result Date: 04/12/2022 CLINICAL DATA:  Midsternal chest pain for 1 week EXAM: CT ANGIOGRAPHY CHEST WITH CONTRAST TECHNIQUE: Multidetector CT imaging of the chest was performed using the standard protocol during bolus administration of intravenous contrast. Multiplanar CT image reconstructions and MIPs were obtained to evaluate the vascular anatomy. RADIATION DOSE REDUCTION: This exam was performed according to the departmental  dose-optimization program which includes automated exposure control, adjustment of the mA and/or kV according to patient size and/or use of iterative reconstruction technique. CONTRAST:  150m OMNIPAQUE IOHEXOL 350 MG/ML SOLN COMPARISON:  Chest x-ray from earlier in the same day. FINDINGS: Cardiovascular: Heart is enlarged in size. Thoracic aorta demonstrates atherosclerotic calcifications without aneurysmal dilatation or dissection. Pulmonary artery demonstrates a normal branching pattern. There are a few filling defects  identified within the right lower lobe pulmonary artery consistent with pulmonary emboli. No evidence of right heart strain is noted. No other filling defects are seen. Pacing device is noted. Mediastinum/Nodes: Thoracic inlet is within normal limits. Changes of prior thyroid surgery are noted. No hilar or mediastinal adenopathy is seen. The esophagus as visualized is within normal limits. Lungs/Pleura: Lungs are well aerated bilaterally. No focal infiltrate or sizable effusion is seen. No sizable parenchymal nodules are noted. Upper Abdomen: Visualized upper abdomen is unremarkable. Musculoskeletal: Degenerative changes of the thoracic spine are seen. No rib abnormality is noted. Review of the MIP images confirms the above findings. IMPRESSION: Small pulmonary emboli within the right lower lobe as described. No right heart strain is noted. No other acute abnormality is noted. Critical Value/emergent results were called by telephone at the time of interpretation on 04/12/2022 at 9:57 pm to  [facility] , who verbally acknowledged these results. Aortic Atherosclerosis (ICD10-I70.0). Electronically Signed   By: Inez Catalina M.D.   On: 04/12/2022 22:00   DG Chest 2 View  Result Date: 04/12/2022 CLINICAL DATA:  chest pain. Pt presents with a 1 week hx of mid-sternal CP that is described as a constant pressure. Pt has associated dry cough and ShOB. Pt has Hx breast CA, CHF, nonsmoker, AICD  EXAM: CHEST - 2 VIEW COMPARISON:  CT angiography chest 04/21/2020, chest x-ray 03/08/2020 FINDINGS: The heart and mediastinal contours are unchanged. Left chest wall dual lead pacemaker defibrillator in similar position. Biapical pleural/pulmonary scarring. No focal consolidation. No pulmonary edema. No pleural effusion. No pneumothorax. No acute osseous abnormality. IMPRESSION: No active cardiopulmonary disease. Electronically Signed   By: Iven Finn M.D.   On: 04/12/2022 17:11    EKG: I independently viewed the EKG done and my findings are as followed: Normal sinus rhythm at a rate of 61 bpm with nonspecific T wave abnormality  Assessment/Plan Present on Admission:  Acute pulmonary embolism (HCC)  GERD (gastroesophageal reflux disease)  Principal Problem:   Acute pulmonary embolism (HCC) Active Problems:   GERD (gastroesophageal reflux disease)   Acquired hypothyroidism   Mixed hyperlipidemia   Chronic combined systolic and diastolic HF (heart failure) (HCC)   Obesity (BMI 30-39.9)  Acute pulmonary embolism Patient presented with chest pain and shortness of breath CT angiography chest with contrast showed small pulmonary emboli within the right lower lobe as described. No right heart strain is noted Patient was started on Eliquis Bilateral lower extremity ultrasound will be done in the morning Echocardiogram will be done in the morning  Chronic combined systolic and diastolic CHF Echocardiogram done on 02/14/2021 showed LVEF of 30 to 35%.  LV has moderately decreased function.  No RWMA.  G1 DD.  RV systolic function is moderately reduced. Continue Coreg, Entresto, Crestor  Acquired hypothyroidism Continue Synthroid  GERD Continue Protonix  Mixed hyperlipidemia Continue Crestor  Obesity (BMI 30.41 kg/m) Continue diet and lifestyle medication   DVT prophylaxis: Eliquis full code  Code Status: Full code  Consults: None  Family Communication: None at  bedside  Severity of Illness: The appropriate patient status for this patient is OBSERVATION. Observation status is judged to be reasonable and necessary in order to provide the required intensity of service to ensure the patient's safety. The patient's presenting symptoms, physical exam findings, and initial radiographic and laboratory data in the context of their medical condition is felt to place them at decreased risk for further clinical deterioration. Furthermore, it is anticipated that the patient will be medically  stable for discharge from the hospital within 2 midnights of admission.   Author: Bernadette Hoit, DO 04/13/2022 12:38 AM  For on call review [email or web address].

## 2022-04-12 NOTE — ED Triage Notes (Signed)
Pt presents with a 1 week hx of mid-sternal CP that is described as a constant pressure. Pt has associated dry cough and ShOB. Pt has AICD in place.

## 2022-04-12 NOTE — ED Provider Notes (Signed)
[facility] Provider Note   CSN: [id number] Arrival date & time: 04/12/22  1606     History  Chief Complaint  Patient presents with  . Chest Pain    [name] [name] is a 61 y.o. female.   Chest Pain Associated symptoms: nausea and shortness of breath   Associated symptoms: no abdominal pain, no back pain, no cough, no fever, no palpitations and no vomiting    61 year old female presents emerged department with complaints of chest pain shortness of breath.  Patient states that both symptoms have gotten progressively worse over the past week.  She also notes cough that has been present for the last week but is dry in nature.  Chest pain is described as pressure/tightness and is worse with physical activity.  It is nonpositionally dependent in nature.  Patient has a history of pulmonary embolism in June 2021 and states the symptoms are very similar to her diagnosis then.  She is on Eliquis for 1 year but is not taking any anticoagulation currently.  She has history of breast cancer.  Denies fever, chills, night sweats, abdominal pain, vomiting, urinary/vaginal symptoms, change in bowel habits.  Home Medications Prior to Admission medications   Medication Sig Start Date End Date Taking? Authorizing Provider  acetaminophen (TYLENOL) 500 MG tablet Take 500 mg by mouth every 6 (six) hours as needed for mild pain or moderate pain.   Yes [provider]  albuterol (VENTOLIN HFA) 108 (90 Base) MCG/ACT inhaler Inhale 2 puffs into the lungs every 6 (six) hours as needed for wheezing or shortness of breath. 08/03/20  Yes Hawks, Christy A, FNP  carvedilol (COREG) 3.125 MG tablet Take 1 tablet (3.125 mg total) by mouth 2 (two) times daily. 06/22/21  Yes Ronnie Doss M, DO  dicyclomine (BENTYL) 10 MG capsule Take 10 mg by mouth 3 (three) times daily before meals.    Yes [provider]  ENTRESTO 97-103 MG TAKE 1 TABLET BY MOUTH TWICE DAILY . APPOINTMENT REQUIRED  FOR FUTURE REFILLS Patient taking differently: Take 1 tablet by mouth 2 (two) times daily. 04/07/22  Yes Bensimhon, Shaune Pascal, MD  eplerenone (INSPRA) 25 MG tablet Take 1 tablet (25 mg total) by mouth daily. PLEASE CALL OFFICE TO ARRANGE FOLLOW UP APPOINTMENT 02/02/22  Yes Bensimhon, Shaune Pascal, MD  levothyroxine (SYNTHROID) 75 MCG tablet TAKE 1 TABLET BY MOUTH ONCE DAILY BEFORE BREAKFAST 08/13/21  Yes Gottschalk, Ashly M, DO  omeprazole (PRILOSEC) 40 MG capsule Take 40 mg by mouth daily.   Yes [provider]  rosuvastatin (CRESTOR) 5 MG tablet Take 1 tablet (5 mg total) by mouth daily. 03/22/22  Yes Freada Bergeron, MD  acetaminophen-codeine (TYLENOL #3) 300-30 MG tablet Take 1 tablet by mouth every 8 (eight) hours as needed for severe pain. Patient not taking: Reported on 04/12/2022 01/05/22   Janora Norlander, DO      Allergies    Avelox [moxifloxacin hcl in nacl], Clindamycin/lincomycin, Kiwi extract, Bactrim [sulfamethoxazole-trimethoprim], Levofloxacin, Simvastatin, and Spironolactone    Review of Systems   Review of Systems  Constitutional:  Negative for chills and fever.  HENT:  Negative for ear pain and sore throat.   Eyes:  Negative for pain and visual disturbance.  Respiratory:  Positive for shortness of breath. Negative for cough.   Cardiovascular:  Positive for chest pain. Negative for palpitations.  Gastrointestinal:  Positive for nausea. Negative for abdominal pain and vomiting.  Genitourinary:  Negative for dysuria and hematuria.  Musculoskeletal:  Negative for arthralgias and back pain.  Skin:  Negative for color change and rash.  Neurological:  Negative for seizures and syncope.  All other systems reviewed and are negative.   Physical Exam Updated Vital Signs BP 120/62   Pulse 64   Temp 98.3 F (36.8 C) (Oral)   Resp 17   Ht '5\' 8"'$  (1.727 m)   Wt 90.7 kg   SpO2 99%   BMI 30.41 kg/m  Physical Exam Vitals and nursing note reviewed.  Constitutional:       General: She is not in acute distress.    Appearance: She is well-developed.  HENT:     Head: Normocephalic and atraumatic.  Eyes:     Conjunctiva/sclera: Conjunctivae normal.  Cardiovascular:     Rate and Rhythm: Normal rate and regular rhythm.     Heart sounds: No murmur heard. Pulmonary:     Effort: Pulmonary effort is normal. No tachypnea or respiratory distress.     Breath sounds: Normal breath sounds.  Abdominal:     Palpations: Abdomen is soft.     Tenderness: There is no abdominal tenderness. There is no guarding.  Musculoskeletal:        General: No swelling.     Cervical back: Neck supple.     Comments: 0-1+ pitting noted on bilateral lower extremities.  Skin:    General: Skin is warm and dry.     Capillary Refill: Capillary refill takes less than 2 seconds.  Neurological:     Mental Status: She is alert.  Psychiatric:        Mood and Affect: Mood normal.     ED Results / Procedures / Treatments   Labs (all labs ordered are listed, but only abnormal results are displayed) Labs Reviewed  CBC - Abnormal; Notable for the following components:      Result Value   RBC 3.72 (*)    Hemoglobin 11.4 (*)    HCT 35.8 (*)    All other components within normal limits  D-DIMER, QUANTITATIVE - Abnormal; Notable for the following components:   D-Dimer, Quant 1.20 (*)    All other components within normal limits  BASIC METABOLIC PANEL  HIV ANTIBODY (ROUTINE TESTING W REFLEX)  COMPREHENSIVE METABOLIC PANEL  CBC  APTT  MAGNESIUM  PHOSPHORUS  TROPONIN I (HIGH SENSITIVITY)  TROPONIN I (HIGH SENSITIVITY)    EKG None  Radiology CT Angio Chest PE W and/or Wo Contrast  Result Date: 04/12/2022 CLINICAL DATA:  Midsternal chest pain for 1 week EXAM: CT ANGIOGRAPHY CHEST WITH CONTRAST TECHNIQUE: Multidetector CT imaging of the chest was performed using the standard protocol during bolus administration of intravenous contrast. Multiplanar CT image reconstructions and MIPs  were obtained to evaluate the vascular anatomy. RADIATION DOSE REDUCTION: This exam was performed according to the departmental dose-optimization program which includes automated exposure control, adjustment of the mA and/or kV according to patient size and/or use of iterative reconstruction technique. CONTRAST:  138m OMNIPAQUE IOHEXOL 350 MG/ML SOLN COMPARISON:  Chest x-ray from earlier in the same day. FINDINGS: Cardiovascular: Heart is enlarged in size. Thoracic aorta demonstrates atherosclerotic calcifications without aneurysmal dilatation or dissection. Pulmonary artery demonstrates a normal branching pattern. There are a few filling defects identified within the right lower lobe pulmonary artery consistent with pulmonary emboli. No evidence of right heart strain is noted. No other filling defects are seen. Pacing device is noted. Mediastinum/Nodes: Thoracic inlet is within normal limits. Changes of prior thyroid surgery are noted. No hilar or  mediastinal adenopathy is seen. The esophagus as visualized is within normal limits. Lungs/Pleura: Lungs are well aerated bilaterally. No focal infiltrate or sizable effusion is seen. No sizable parenchymal nodules are noted. Upper Abdomen: Visualized upper abdomen is unremarkable. Musculoskeletal: Degenerative changes of the thoracic spine are seen. No rib abnormality is noted. Review of the MIP images confirms the above findings. IMPRESSION: Small pulmonary emboli within the right lower lobe as described. No right heart strain is noted. No other acute abnormality is noted. Critical Value/emergent results were called by telephone at the time of interpretation on 04/12/2022 at 9:57 pm to [facility] , who verbally acknowledged these results. Aortic Atherosclerosis (ICD10-I70.0). Electronically Signed   By: Inez Catalina M.D.   On: 04/12/2022 22:00   DG Chest 2 View  Result Date: 04/12/2022 CLINICAL DATA:  chest pain. Pt presents with a 1 week hx of mid-sternal  CP that is described as a constant pressure. Pt has associated dry cough and ShOB. Pt has Hx breast CA, CHF, nonsmoker, AICD EXAM: CHEST - 2 VIEW COMPARISON:  CT angiography chest 04/21/2020, chest x-ray 03/08/2020 FINDINGS: The heart and mediastinal contours are unchanged. Left chest wall dual lead pacemaker defibrillator in similar position. Biapical pleural/pulmonary scarring. No focal consolidation. No pulmonary edema. No pleural effusion. No pneumothorax. No acute osseous abnormality. IMPRESSION: No active cardiopulmonary disease. Electronically Signed   By: Iven Finn M.D.   On: 04/12/2022 17:11    Procedures Procedures    Medications Ordered in ED Medications  acetaminophen (TYLENOL) tablet 650 mg (has no administration in time range)    Or  acetaminophen (TYLENOL) suppository 650 mg (has no administration in time range)  ondansetron (ZOFRAN) tablet 4 mg (has no administration in time range)    Or  ondansetron (ZOFRAN) injection 4 mg (has no administration in time range)  apixaban (ELIQUIS) tablet 10 mg (has no administration in time range)    Followed by  apixaban (ELIQUIS) tablet 5 mg (has no administration in time range)  iohexol (OMNIPAQUE) 350 MG/ML injection 100 mL (100 mLs Intravenous Contrast Given 04/12/22 2145)    ED Course/ Medical Decision Making/ A&P Clinical Course as of 04/12/22 2357  Tue Apr 12, 2022  2355 Consulted Dr.Oladapo of hospital medicine, and he agreed to admission as well as assuming further treatment/care of patient. [CR]    Clinical Course User Index [CR] Wilnette Kales, PA                           Medical Decision Making Amount and/or Complexity of Data Reviewed Labs: ordered. Radiology: ordered.  Risk Prescription drug management. Decision regarding hospitalization.   This patient presents to the ED for concern of chest pain/shortness of breath, this involves an extensive number of treatment options, and is a complaint that carries  with it a high risk of complications and morbidity.  The differential diagnosis includes The emergent causes of chest pain include: Acute coronary syndrome, tamponade, pericarditis/myocarditis, aortic dissection, pulmonary embolism, tension pneumothorax, pneumonia, and esophageal rupture. other urgent/non-acute considerations include, but are not limited to: chronic angina, aortic stenosis, cardiomyopathy, mitral valve prolapse, pulmonary hypertension, aortic insufficiency, right ventricular hypertrophy, pleuritis, bronchitis, pneumothorax, tumor, gastroesophageal reflux disease (GERD), esophageal spasm, Mallory-Weiss syndrome, peptic ulcer disease, pancreatitis, functional gastrointestinal pain, cervical or thoracic disk disease or arthritis, shoulder arthritis, costochondritis, subacromial bursitis, anxiety or panic attack, herpes zoster, breast disorders, chest wall tumors, thoracic outlet syndrome, mediastinitis.   Co morbidities that  complicate the patient evaluation  Breast cancer, CHF, hyperlipidemia, hypothyroidism, mitral regurgitation, nonischemic cardiomyopathy, TIA,   Additional history obtained:  Additional history obtained from CT angio chest PE study from 04/21/20 External records from outside source obtained and reviewed including eccentric, more subacute to chronic appearing small segmental PE in the right upper lobe and subsegmental PE in the left lower lobe.  Overall clot burden is extremely small.  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  Lab Tests:  I Ordered, and personally interpreted labs.  The pertinent results include: CBC with hemoglobin 11.4 and D-dimer of 1.2   Imaging Studies ordered:  I ordered imaging studies including chest x-ray, CT angio chest PE I independently visualized and interpreted imaging which showed  Chest x-ray: No acute abnormality CT angio chest: Small pulmonary emboli within the  right lower lobe as described.  No right heart strain is noted.  No other acute abnormalities noted. I agree with the radiologist interpretation   Cardiac Monitoring: / EKG:  The patient was maintained on a cardiac monitor.  I personally viewed and interpreted the cardiac monitored which showed an underlying rhythm of: Sinus rhythm   Consultations Obtained:  See ED course  Problem List / ED Course / Critical interventions / Medication management  Pulmonary embolism I ordered medication including heparin for anticoagulation per hospitalist request   Reevaluation of the patient after these medicines showed that the patient stayed the same I have reviewed the patients home medicines and have made adjustments as needed   Social Determinants of Health:  Denies tobacco, illicit drug use.   Test / Admission - Considered:  Pulmonary embolism Vitals signs within normal range and stable throughout visit. Laboratory/imaging studies significant for: Pulmonary embolism as described above with elevated D-dimer.  Patient deemed to meet admission criteria.  Anticoagulation started while in the emergency department.  Treatment plan was discussed at length with patient, and she acknowledged understanding and was agreeable to said plan. The patient appears reasonably stabilized for admission considering the current resources, flow, and capabilities available in the ED at this time, and I doubt any other [facility] requiring further screening and/or treatment in the ED prior to admission.         Final Clinical Impression(s) / ED Diagnoses Final diagnoses:  Other acute pulmonary embolism without acute cor pulmonale [facility])    Rx / DC Orders ED Discharge Orders     None         Wilnette Kales, [place] 04/12/22 2357    Fredia Sorrow, MD 04/23/22 [phone]

## 2022-04-12 NOTE — H&P (Incomplete)
History and Physical    Patient: [name] [name]:[id number] DOB: [birth year] DOA: 04/12/2022 DOS: the patient was seen and examined on 04/12/2022 PCP: Janora Norlander, DO  Patient coming from: Home  Chief Complaint:  Chief Complaint  Patient presents with  . Chest Pain   HPI: [name] [name] is a 61 y.o. female with medical history significant of chronic systolic CHF, hypothyroidism, GERD, hyperlipidemia, breast cancer status post lumpectomy and radiation (May 2017), ICD placement who presents to the emergency department due to 1 week onset of chest pain and shortness of breath which has progressively worsened within the last 3 to 4 days, she complained of nonproductive cough.  Chest pain was described as pressure/tightness and it worsens with physical activity.  Patient states symptoms were similar to when she was diagnosed with PE in 2021 and she was placed on Eliquis for 1 year (currently not on Eliquis).  Patient states that since she ate banana this morning, her symptoms have since worsened.  She denies headache, fever, chills, vomiting, abdominal pain.    ED Course: In the emergency department, she was initially bradycardic with HR ranging from 55-59, but is subsequently improved into the 60s, other vital signs were within normal range.  Work-up in the ED showed normocytic anemia, normal BMP, troponin x2 was negative, D-dimer 1.20. CT angiography of the chest with contrast showed small pulmonary emboli within the right lower lobe as described. No right heart strain is noted Chest x-ray showed no active cardiopulmonary disease Patient was started on Eliquis, hospitalist was asked to admit patient for further evaluation and management.  Review of Systems: Review of systems as noted in the HPI. All other systems reviewed and are negative.   Past Medical History:  Diagnosis Date  . AICD (automatic cardioverter/defibrillator) present 09   [employer]- replaced 2016  . Arthritis    . Bradycardia   . Breast cancer ([place]) 02/24/2016   s/p lumpectomy and radiation but refused chemotherapy  . Chronic systolic CHF (congestive heart failure) ([place])   . Cough due to angiotensin-converting enzyme inhibitor   . Dizziness    Dizziness with standing after squatting, March, 2012  . Dyslipidemia    LDL elevation,Patient does not want statin  . Family history of breast cancer   . GERD (gastroesophageal reflux disease)   . H/O shortness of breath    CPX 10/18:  No evidence of cardiopulmonary limitation. Suspect exercise intolerance related to weight and deconditioning.   [name] Hypothyroidism   . ICD (implantable cardiac defibrillator) battery depletion    Dr. [name], June, 2009(artifactual atrial tachycardia response events addressed by reprogramming in parentheses  . Leg swelling    right leg  . Mitral regurgitation   . Nonischemic cardiomyopathy ([place])    Etiology unknown, diagnosed 2008  . Personal history of radiation therapy 2017  . Pneumonia due to COVID-19 virus 10/2019  . TIA (transient ischemic attack)    No CT or MRI abnormality, aspirin therapy   Past Surgical History:  Procedure Laterality Date  . BREAST BIOPSY Left 02/24/2016   U/S Core  . BREAST BIOPSY Left 02/24/2016   U/S Core  . BREAST LUMPECTOMY Left 04/05/2016   invasive ductal   . BREAST LUMPECTOMY WITH RADIOACTIVE SEED AND SENTINEL LYMPH NODE BIOPSY Left 04/05/2016   Procedure: BREAST LUMPECTOMY WITH RADIOACTIVE SEED AND SENTINEL LYMPH NODE BIOPSY;  Surgeon: [name], MD;  Location: [place];  Service: General;  Laterality: Left;  . CARDIAC CATHETERIZATION N/A 08/05/2016  Procedure: Left Heart Cath and Coronary Angiography;  Surgeon: Peter M Martinique, MD;  Location: [place] [facility];  Service: Cardiovascular;  Laterality: N/A;  . CARDIAC DEFIBRILLATOR PLACEMENT  09   [employer] no remote  . COLONOSCOPY    . CYST EXCISION Left    back of leg-lt   . IMPLANTABLE CARDIOVERTER DEFIBRILLATOR  GENERATOR CHANGE N/A 12/29/2014   Procedure: IMPLANTABLE CARDIOVERTER DEFIBRILLATOR GENERATOR CHANGE;  Surgeon: [name], MD;  Location[facility];  Service: Cardiovascular;  Laterality: N/A;  . LATERAL EPICONDYLE RELEASE  10/11/2012   Procedure: TENNIS ELBOW RELEASE;  Surgeon: [name], MD;  Location: [place];  Service: Orthopedics;  Laterality: Right;  RIGHT ELBOW: TENOTOMY ELBOW LATERAL EPICONDYLITIS TENNIS ELBOW: RADIAL TUNNEL RELEASE  . PORT-A-CATH REMOVAL  04/13/2016   Procedure: MINOR REMOVAL PORT-A-CATH;  Surgeon: [name], MD;  Location: [place];  Service: General;;  . PORTACATH PLACEMENT Right 04/05/2016   Procedure: INSERTION PORT-A-CATH WITH [place];  Surgeon: [name], MD;  Location: [place];  Service: General;  Laterality: Right;  right IJ  . TOTAL THYROIDECTOMY  2001  . TUBAL LIGATION  94  . UPPER GI ENDOSCOPY      Social History:  reports that she has never smoked. She has never used smokeless tobacco. She reports that she does not drink alcohol and does not use drugs.   Allergies  Allergen Reactions  . Avelox [Moxifloxacin Hcl In Nacl] Shortness Of Breath    Shortness of breath  . Clindamycin/Lincomycin Shortness Of Breath and Diarrhea    Breathing/diarrhea    . Kiwi Extract Anaphylaxis    Makes pt. Feel like her throat is closing   . Bactrim [Sulfamethoxazole-Trimethoprim] Swelling  . Levofloxacin Other (See Comments)    JOINT PAIN JOINT PAIN  . Simvastatin Other (See Comments)    Joint pain  . Spironolactone Other (See Comments)    burning    Family History  Problem Relation Age of Onset  . Heart attack Mother   . Cancer Mother        breast  . Breast cancer Mother 42  . Hypertension Sister   . Hypertension Brother   . Cancer Maternal Aunt        breast  . Breast cancer Maternal Aunt   . Cancer Paternal Grandmother        possible cancer, unknown type  . Stroke Neg Hx   . Diabetes Neg Hx      ***  Prior to Admission medications   Medication Sig Start Date End Date Taking? Authorizing Provider  acetaminophen (TYLENOL) 500 MG tablet Take 500 mg by mouth every 6 (six) hours as needed for mild pain or moderate pain.   Yes [provider]  albuterol (VENTOLIN HFA) 108 (90 Base) MCG/ACT inhaler Inhale 2 puffs into the lungs every 6 (six) hours as needed for wheezing or shortness of breath. 08/03/20  Yes Hawks, Christy A, FNP  carvedilol (COREG) 3.125 MG tablet Take 1 tablet (3.125 mg total) by mouth 2 (two) times daily. 06/22/21  Yes Ronnie Doss M, DO  dicyclomine (BENTYL) 10 MG capsule Take 10 mg by mouth 3 (three) times daily before meals.    Yes [provider]  ENTRESTO 97-103 MG TAKE 1 TABLET BY MOUTH TWICE DAILY . APPOINTMENT REQUIRED FOR FUTURE REFILLS Patient taking differently: Take 1 tablet by mouth 2 (two) times daily. 04/07/22  Yes Bensimhon, Shaune Pascal, MD  eplerenone (INSPRA) 25 MG tablet  Take 1 tablet (25 mg total) by mouth daily. PLEASE CALL OFFICE TO ARRANGE FOLLOW UP APPOINTMENT 02/02/22  Yes Bensimhon, Shaune Pascal, MD  levothyroxine (SYNTHROID) 75 MCG tablet TAKE 1 TABLET BY MOUTH ONCE DAILY BEFORE BREAKFAST 08/13/21  Yes Gottschalk, Ashly M, DO  omeprazole (PRILOSEC) 40 MG capsule Take 40 mg by mouth daily.   Yes [provider]  rosuvastatin (CRESTOR) 5 MG tablet Take 1 tablet (5 mg total) by mouth daily. 03/22/22  Yes Freada Bergeron, MD  acetaminophen-codeine (TYLENOL #3) 300-30 MG tablet Take 1 tablet by mouth every 8 (eight) hours as needed for severe pain. Patient not taking: Reported on 04/12/2022 01/05/22   Janora Norlander, DO    Physical Exam: BP 120/62   Pulse 64   Temp 98.3 F (36.8 C) (Oral)   Resp 17   Ht '5\' 8"'$  (1.727 m)   Wt 90.7 kg   SpO2 99%   BMI 30.41 kg/m   General: 61 y.o. year-old female well developed well nourished in no acute distress.  Alert and oriented x3. HEENT: NCAT, EOMI Neck: Supple, trachea  medial Cardiovascular: Regular rate and rhythm with no rubs or gallops.  No thyromegaly or JVD noted.  No lower extremity edema. 2/4 pulses in all 4 extremities. Respiratory: Clear to auscultation with no wheezes or rales. Good inspiratory effort. Abdomen: Soft, nontender nondistended with normal bowel sounds x4 quadrants. Muskuloskeletal: No cyanosis, clubbing or edema noted bilaterally Neuro: CN II-XII intact, strength 5/5 x 4, sensation, reflexes intact Skin: No ulcerative lesions noted or rashes Psychiatry: Judgement and insight appear normal. Mood is appropriate for condition and setting          Labs on Admission:  Basic Metabolic Panel: Recent Labs  Lab 04/12/22 1704  NA 139  K 4.1  CL 107  CO2 26  GLUCOSE 94  BUN 12  CREATININE 0.86  CALCIUM 9.3   Liver Function Tests: No results for input(s): "AST", "ALT", "ALKPHOS", "BILITOT", "PROT", "ALBUMIN" in the last 168 hours. No results for input(s): "LIPASE", "AMYLASE" in the last 168 hours. No results for input(s): "AMMONIA" in the last 168 hours. CBC: Recent Labs  Lab 04/12/22 1704  WBC 5.0  HGB 11.4*  HCT 35.8*  MCV 96.2  PLT 232   Cardiac Enzymes: No results for input(s): "CKTOTAL", "CKMB", "CKMBINDEX", "TROPONINI" in the last 168 hours.  BNP (last 3 results) Recent Labs    06/17/21 1513  BNP 36.9    ProBNP (last 3 results) No results for input(s): "PROBNP" in the last 8760 hours.  CBG: No results for input(s): "GLUCAP" in the last 168 hours.  Radiological Exams on Admission: CT Angio Chest PE W and/or Wo Contrast  Result Date: 04/12/2022 CLINICAL DATA:  Midsternal chest pain for 1 week EXAM: CT ANGIOGRAPHY CHEST WITH CONTRAST TECHNIQUE: Multidetector CT imaging of the chest was performed using the standard protocol during bolus administration of intravenous contrast. Multiplanar CT image reconstructions and MIPs were obtained to evaluate the vascular anatomy. RADIATION DOSE REDUCTION: This exam was  performed according to the departmental dose-optimization program which includes automated exposure control, adjustment of the mA and/or kV according to patient size and/or use of iterative reconstruction technique. CONTRAST:  123m OMNIPAQUE IOHEXOL 350 MG/ML SOLN COMPARISON:  Chest x-ray from earlier in the same day. FINDINGS: Cardiovascular: Heart is enlarged in size. Thoracic aorta demonstrates atherosclerotic calcifications without aneurysmal dilatation or dissection. Pulmonary artery demonstrates a normal branching pattern. There are a few filling defects identified within the  right lower lobe pulmonary artery consistent with pulmonary emboli. No evidence of right heart strain is noted. No other filling defects are seen. Pacing device is noted. Mediastinum/Nodes: Thoracic inlet is within normal limits. Changes of prior thyroid surgery are noted. No hilar or mediastinal adenopathy is seen. The esophagus as visualized is within normal limits. Lungs/Pleura: Lungs are well aerated bilaterally. No focal infiltrate or sizable effusion is seen. No sizable parenchymal nodules are noted. Upper Abdomen: Visualized upper abdomen is unremarkable. Musculoskeletal: Degenerative changes of the thoracic spine are seen. No rib abnormality is noted. Review of the MIP images confirms the above findings. IMPRESSION: Small pulmonary emboli within the right lower lobe as described. No right heart strain is noted. No other acute abnormality is noted. Critical Value/emergent results were called by telephone at the time of interpretation on 04/12/2022 at 9:57 pm to [facility] , who verbally acknowledged these results. Aortic Atherosclerosis (ICD10-I70.0). Electronically Signed   By: Inez Catalina M.D.   On: 04/12/2022 22:00   DG Chest 2 View  Result Date: 04/12/2022 CLINICAL DATA:  chest pain. Pt presents with a 1 week hx of mid-sternal CP that is described as a constant pressure. Pt has associated dry cough and ShOB. Pt has  Hx breast CA, CHF, nonsmoker, AICD EXAM: CHEST - 2 VIEW COMPARISON:  CT angiography chest 04/21/2020, chest x-ray 03/08/2020 FINDINGS: The heart and mediastinal contours are unchanged. Left chest wall dual lead pacemaker defibrillator in similar position. Biapical pleural/pulmonary scarring. No focal consolidation. No pulmonary edema. No pleural effusion. No pneumothorax. No acute osseous abnormality. IMPRESSION: No active cardiopulmonary disease. Electronically Signed   By: Iven Finn M.D.   On: 04/12/2022 17:11    EKG: I independently viewed the EKG done and my findings are as followed: Normal sinus rhythm at a rate of 61 bpm with nonspecific T wave abnormality  Assessment/Plan Present on Admission: . Acute pulmonary embolism (HCC)  Principal Problem:   Acute pulmonary embolism (HCC)  Acute pulmonary embolism   Chronic systolic CHF Acquired hypothyroidism GERD Mixed hyperlipidemia   DVT prophylaxis: ***   Code Status: ***   Family Communication: ***   Disposition Plan: ***   Consults called: ***   Admission status: ***     Bernadette Hoit MD [facility] Pager [phone]  If 7PM-7AM, please contact night-coverage [email or web address] Password [facility]  04/12/2022, 11:44 PM        Review of Systems: {ROS_Text:26778} Past Medical History:  Diagnosis Date  . AICD (automatic cardioverter/defibrillator) present 09   [employer]- replaced 2016  . Arthritis   . Bradycardia   . Breast cancer ([place]) 02/24/2016   s/p lumpectomy and radiation but refused chemotherapy  . Chronic systolic CHF (congestive heart failure) ([place])   . Cough due to angiotensin-converting enzyme inhibitor   . Dizziness    Dizziness with standing after squatting, March, 2012  . Dyslipidemia    LDL elevation,Patient does not want statin  . Family history of breast cancer   . GERD (gastroesophageal reflux disease)   . H/O shortness of breath    CPX 10/18:  No evidence of  cardiopulmonary limitation. Suspect exercise intolerance related to weight and deconditioning.   [name] Hypothyroidism   . ICD (implantable cardiac defibrillator) battery depletion    Dr. [name], June, 2009(artifactual atrial tachycardia response events addressed by reprogramming in parentheses  . Leg swelling    right leg  . Mitral regurgitation   . Nonischemic cardiomyopathy ([place])    Etiology unknown, diagnosed 2008  .  Personal history of radiation therapy 2017  . Pneumonia due to COVID-19 virus 10/2019  . TIA (transient ischemic attack)    No CT or MRI abnormality, aspirin therapy   Past Surgical History:  Procedure Laterality Date  . BREAST BIOPSY Left 02/24/2016   U/S Core  . BREAST BIOPSY Left 02/24/2016   U/S Core  . BREAST LUMPECTOMY Left 04/05/2016   invasive ductal   . BREAST LUMPECTOMY WITH RADIOACTIVE SEED AND SENTINEL LYMPH NODE BIOPSY Left 04/05/2016   Procedure: BREAST LUMPECTOMY WITH RADIOACTIVE SEED AND SENTINEL LYMPH NODE BIOPSY;  Surgeon: [name], MD;  Location: [place];  Service: General;  Laterality: Left;  . CARDIAC CATHETERIZATION N/A 08/05/2016   Procedure: Left Heart Cath and Coronary Angiography;  Surgeon: Peter M Martinique, MD;  Location: [place] CV LAB;  Service: Cardiovascular;  Laterality: N/A;  . CARDIAC DEFIBRILLATOR PLACEMENT  09   [employer] no remote  . COLONOSCOPY    . CYST EXCISION Left    back of leg-lt   . IMPLANTABLE CARDIOVERTER DEFIBRILLATOR GENERATOR CHANGE N/A 12/29/2014   Procedure: IMPLANTABLE CARDIOVERTER DEFIBRILLATOR GENERATOR CHANGE;  Surgeon: [name], MD;  Location: [facility] CATH LAB;  Service: Cardiovascular;  Laterality: N/A;  . LATERAL EPICONDYLE RELEASE  10/11/2012   Procedure: TENNIS ELBOW RELEASE;  Surgeon: [name], MD;  Location: [place];  Service: Orthopedics;  Laterality: Right;  RIGHT ELBOW: TENOTOMY ELBOW LATERAL EPICONDYLITIS TENNIS ELBOW: RADIAL TUNNEL RELEASE  . PORT-A-CATH REMOVAL   04/13/2016   Procedure: MINOR REMOVAL PORT-A-CATH;  Surgeon: [name], MD;  Location: [place];  Service: General;;  . PORTACATH PLACEMENT Right 04/05/2016   Procedure: INSERTION PORT-A-CATH WITH [place];  Surgeon: [name], MD;  Location: [place];  Service: General;  Laterality: Right;  right IJ  . TOTAL THYROIDECTOMY  2001  . TUBAL LIGATION  94  . UPPER GI ENDOSCOPY     Social History:  reports that she has never smoked. She has never used smokeless tobacco. She reports that she does not drink alcohol and does not use drugs.  Allergies  Allergen Reactions  . Avelox [Moxifloxacin Hcl In Nacl] Shortness Of Breath    Shortness of breath  . Clindamycin/Lincomycin Shortness Of Breath and Diarrhea    Breathing/diarrhea    . Kiwi Extract Anaphylaxis    Makes pt. Feel like her throat is closing   . Bactrim [Sulfamethoxazole-Trimethoprim] Swelling  . Levofloxacin Other (See Comments)    JOINT PAIN JOINT PAIN  . Simvastatin Other (See Comments)    Joint pain  . Spironolactone Other (See Comments)    burning    Family History  Problem Relation Age of Onset  . Heart attack Mother   . Cancer Mother        breast  . Breast cancer Mother 61  . Hypertension Sister   . Hypertension Brother   . Cancer Maternal Aunt        breast  . Breast cancer Maternal Aunt   . Cancer Paternal Grandmother        possible cancer, unknown type  . Stroke Neg Hx   . Diabetes Neg Hx     Prior to Admission medications   Medication Sig Start Date End Date Taking? Authorizing Provider  acetaminophen (TYLENOL) 500 MG tablet Take 500 mg by mouth every 6 (six) hours as needed for mild pain or moderate pain.   Yes [provider]  albuterol (VENTOLIN HFA) 108 (90 Base) MCG/ACT inhaler Inhale  2 puffs into the lungs every 6 (six) hours as needed for wheezing or shortness of breath. 08/03/20  Yes Hawks, Christy A, FNP  carvedilol (COREG) 3.125 MG tablet Take 1 tablet (3.125 mg total)  by mouth 2 (two) times daily. 06/22/21  Yes Ronnie Doss M, DO  dicyclomine (BENTYL) 10 MG capsule Take 10 mg by mouth 3 (three) times daily before meals.    Yes [provider]  ENTRESTO 97-103 MG TAKE 1 TABLET BY MOUTH TWICE DAILY . APPOINTMENT REQUIRED FOR FUTURE REFILLS Patient taking differently: Take 1 tablet by mouth 2 (two) times daily. 04/07/22  Yes Bensimhon, Shaune Pascal, MD  eplerenone (INSPRA) 25 MG tablet Take 1 tablet (25 mg total) by mouth daily. PLEASE CALL OFFICE TO ARRANGE FOLLOW UP APPOINTMENT 02/02/22  Yes Bensimhon, Shaune Pascal, MD  levothyroxine (SYNTHROID) 75 MCG tablet TAKE 1 TABLET BY MOUTH ONCE DAILY BEFORE BREAKFAST 08/13/21  Yes Gottschalk, Ashly M, DO  omeprazole (PRILOSEC) 40 MG capsule Take 40 mg by mouth daily.   Yes [provider]  rosuvastatin (CRESTOR) 5 MG tablet Take 1 tablet (5 mg total) by mouth daily. 03/22/22  Yes Freada Bergeron, MD  acetaminophen-codeine (TYLENOL #3) 300-30 MG tablet Take 1 tablet by mouth every 8 (eight) hours as needed for severe pain. Patient not taking: Reported on 04/12/2022 01/05/22   Janora Norlander, DO    Physical Exam: Vitals:   04/12/22 2100 04/12/22 2130 04/12/22 2200 04/12/22 2230  BP: 127/78 125/79 127/73 124/69  Pulse: (!) 55 62 (!) 58 62  Resp: '16 16 20 15  '$ Temp:      TempSrc:      SpO2: 99% 100% 100% 96%  Weight:      Height:       *** Data Reviewed: {Tip this will not be part of the note when signed- Document your independent interpretation of telemetry tracing, EKG, lab, Radiology test or any other diagnostic tests. Add any new diagnostic test ordered today. (Optional):26781} {Results:26384}  Assessment and Plan: No notes have been filed under this hospital service. Service: Hospitalist     Advance Care Planning:   Code Status: Prior ***  Consults: ***  Family Communication: ***  Severity of Illness: {Observation/Inpatient:21159}  Author: Bernadette Hoit, DO 04/12/2022  10:39 PM  For on call review [email or web address].

## 2022-04-13 ENCOUNTER — Observation Stay (HOSPITAL_BASED_OUTPATIENT_CLINIC_OR_DEPARTMENT_OTHER): Payer: 59

## 2022-04-13 ENCOUNTER — Encounter (HOSPITAL_COMMUNITY): Payer: Self-pay

## 2022-04-13 ENCOUNTER — Observation Stay (HOSPITAL_COMMUNITY): Payer: 59

## 2022-04-13 DIAGNOSIS — I5042 Chronic combined systolic (congestive) and diastolic (congestive) heart failure: Secondary | ICD-10-CM | POA: Diagnosis not present

## 2022-04-13 DIAGNOSIS — I2699 Other pulmonary embolism without acute cor pulmonale: Secondary | ICD-10-CM

## 2022-04-13 DIAGNOSIS — E039 Hypothyroidism, unspecified: Secondary | ICD-10-CM | POA: Diagnosis not present

## 2022-04-13 DIAGNOSIS — R079 Chest pain, unspecified: Secondary | ICD-10-CM | POA: Diagnosis not present

## 2022-04-13 DIAGNOSIS — I82431 Acute embolism and thrombosis of right popliteal vein: Secondary | ICD-10-CM

## 2022-04-13 DIAGNOSIS — E669 Obesity, unspecified: Secondary | ICD-10-CM

## 2022-04-13 HISTORY — DX: Acute embolism and thrombosis of right popliteal vein: I82.431

## 2022-04-13 LAB — ECHOCARDIOGRAM COMPLETE
AR max vel: 2.62 cm2
AV Area VTI: 2.48 cm2
AV Area mean vel: 2.14 cm2
AV Mean grad: 3 mmHg
AV Peak grad: 4.4 mmHg
Ao pk vel: 1.05 m/s
Area-P 1/2: 1.79 cm2
Calc EF: 36 %
Height: 68 in
MV VTI: 3.69 cm2
S' Lateral: 4.2 cm
Single Plane A2C EF: 35.6 %
Single Plane A4C EF: 36.1 %
Weight: 3178.15 oz

## 2022-04-13 LAB — CBC
HCT: 32.6 % — ABNORMAL LOW (ref 36.0–46.0)
Hemoglobin: 10.4 g/dL — ABNORMAL LOW (ref 12.0–15.0)
MCH: 30.5 pg (ref 26.0–34.0)
MCHC: 31.9 g/dL (ref 30.0–36.0)
MCV: 95.6 fL (ref 80.0–100.0)
Platelets: 194 10*3/uL (ref 150–400)
RBC: 3.41 MIL/uL — ABNORMAL LOW (ref 3.87–5.11)
RDW: 12.8 % (ref 11.5–15.5)
WBC: 5.5 10*3/uL (ref 4.0–10.5)
nRBC: 0 % (ref 0.0–0.2)

## 2022-04-13 LAB — COMPREHENSIVE METABOLIC PANEL
ALT: 10 U/L (ref 0–44)
AST: 12 U/L — ABNORMAL LOW (ref 15–41)
Albumin: 3.3 g/dL — ABNORMAL LOW (ref 3.5–5.0)
Alkaline Phosphatase: 42 U/L (ref 38–126)
Anion gap: 4 — ABNORMAL LOW (ref 5–15)
BUN: 14 mg/dL (ref 8–23)
CO2: 27 mmol/L (ref 22–32)
Calcium: 9 mg/dL (ref 8.9–10.3)
Chloride: 110 mmol/L (ref 98–111)
Creatinine, Ser: 0.84 mg/dL (ref 0.44–1.00)
GFR, Estimated: >60 mL/min (ref >60)
Glucose, Bld: 93 mg/dL (ref 70–99)
Potassium: 3.7 mmol/L (ref 3.5–5.1)
Sodium: 141 mmol/L (ref 135–145)
Total Bilirubin: 0.4 mg/dL (ref 0.3–1.2)
Total Protein: 6.3 g/dL — ABNORMAL LOW (ref 6.5–8.1)

## 2022-04-13 LAB — MAGNESIUM: Magnesium: 2.1 mg/dL (ref 1.7–2.4)

## 2022-04-13 LAB — APTT: aPTT: 33 seconds (ref 24–36)

## 2022-04-13 LAB — HIV ANTIBODY (ROUTINE TESTING W REFLEX): HIV Screen 4th Generation wRfx: NONREACTIVE

## 2022-04-13 LAB — PHOSPHORUS: Phosphorus: 3.8 mg/dL (ref 2.5–4.6)

## 2022-04-13 MED ORDER — ENTRESTO 97-103 MG PO TABS: 1.0000 | ORAL_TABLET | Freq: Two times a day (BID) | ORAL | 1 refills | Status: DC

## 2022-04-13 MED ORDER — LEVOTHYROXINE SODIUM 75 MCG PO TABS
75.0000 ug | ORAL_TABLET | Freq: Every day | ORAL | Status: DC
  Administered 2022-04-13: 75 ug via ORAL
  Filled 2022-04-13: qty 1

## 2022-04-13 MED ORDER — HYDROCODONE-ACETAMINOPHEN 5-325 MG PO TABS: 1.0000 | ORAL_TABLET | ORAL | Status: DC | PRN

## 2022-04-13 MED ORDER — APIXABAN (ELIQUIS) VTE STARTER PACK (10MG AND 5MG): ORAL_TABLET | ORAL | 0 refills | Status: DC

## 2022-04-13 MED ORDER — SACUBITRIL-VALSARTAN 97-103 MG PO TABS
1.0000 | ORAL_TABLET | Freq: Two times a day (BID) | ORAL | Status: DC
  Administered 2022-04-13: 1 via ORAL
  Filled 2022-04-13 (×5): qty 1

## 2022-04-13 MED ORDER — CARVEDILOL 3.125 MG PO TABS
3.1250 mg | ORAL_TABLET | Freq: Two times a day (BID) | ORAL | Status: DC
  Administered 2022-04-13: 3.125 mg via ORAL
  Filled 2022-04-13: qty 1

## 2022-04-13 MED ORDER — ROSUVASTATIN CALCIUM 10 MG PO TABS
5.0000 mg | ORAL_TABLET | Freq: Every day | ORAL | Status: DC
  Administered 2022-04-13: 5 mg via ORAL
  Filled 2022-04-13: qty 1

## 2022-04-13 MED ORDER — PANTOPRAZOLE SODIUM 40 MG PO TBEC
40.0000 mg | DELAYED_RELEASE_TABLET | Freq: Every day | ORAL | Status: DC
  Administered 2022-04-13: 40 mg via ORAL
  Filled 2022-04-13: qty 1

## 2022-04-13 NOTE — Progress Notes (Signed)
  Transition of Care [facility]) Screening Note   Patient Details  Name: [name] [name] Date of Birth: 06-22-1961   Transition of Care [facility]) CM/SW Contact:    Ihor Gully, LCSW Phone Number: 04/13/2022, 9:43 AM    Transition of Care [facility]) has reviewed patient and no TOC needs have been identified at this time. We will continue to monitor patient advancement through interdisciplinary progression rounds. If new patient transition needs arise, please place a TOC consult.

## 2022-04-13 NOTE — Discharge Instructions (Signed)
IMPORTANT INFORMATION: PAY CLOSE ATTENTION  ° °PHYSICIAN DISCHARGE INSTRUCTIONS ° °Follow with Primary care provider  Gottschalk, Ashly M, DO  and other consultants as instructed by your Hospitalist Physician ° °SEEK MEDICAL CARE OR RETURN TO EMERGENCY ROOM IF SYMPTOMS COME BACK, WORSEN OR NEW PROBLEM DEVELOPS  ° °Please note: °You were cared for by a hospitalist during your hospital stay. Every effort will be made to forward records to your primary care provider.  You can request that your primary care provider send for your hospital records if they have not received them.  Once you are discharged, your primary care physician will handle any further medical issues. Please note that NO REFILLS for any discharge medications will be authorized once you are discharged, as it is imperative that you return to your primary care physician (or establish a relationship with a primary care physician if you do not have one) for your post hospital discharge needs so that they can reassess your need for medications and monitor your lab values. ° °Please get a complete blood count and chemistry panel checked by your Primary MD at your next visit, and again as instructed by your Primary MD. ° °Get Medicines reviewed and adjusted: °Please take all your medications with you for your next visit with your Primary MD ° °Laboratory/radiological data: °Please request your Primary MD to go over all hospital tests and procedure/radiological results at the follow up, please ask your primary care provider to get all Hospital records sent to his/her office. ° °In some cases, they will be blood work, cultures and biopsy results pending at the time of your discharge. Please request that your primary care provider follow up on these results. ° °If you are diabetic, please bring your blood sugar readings with you to your follow up appointment with primary care.   ° °Please call and make your follow up appointments as soon as possible.   ° °Also  Note the following: °If you experience worsening of your admission symptoms, develop shortness of breath, life threatening emergency, suicidal or homicidal thoughts you must seek medical attention immediately by calling 911 or calling your MD immediately  if symptoms less severe. ° °You must read complete instructions/literature along with all the possible adverse reactions/side effects for all the Medicines you take and that have been prescribed to you. Take any new Medicines after you have completely understood and accpet all the possible adverse reactions/side effects.  ° °Do not drive when taking Pain medications or sleeping medications (Benzodiazepines) ° °Do not take more than prescribed Pain, Sleep and Anxiety Medications. It is not advisable to combine anxiety,sleep and pain medications without talking with your primary care practitioner ° °Special Instructions: If you have smoked or chewed Tobacco  in the last 2 yrs please stop smoking, stop any regular Alcohol  and or any Recreational drug use. ° °Wear Seat belts while driving.  Do not drive if taking any narcotic, mind altering or controlled substances or recreational drugs or alcohol.  ° ° ° ° ° °

## 2022-04-13 NOTE — Discharge Summary (Addendum)
Physician Discharge Summary  [name] [name] YPP:[id number] DOB: 04-13-1961 DOA: 04/12/2022  PCP: Janora Norlander, DO  Admit date: 04/12/2022 Discharge date: 04/13/2022  Admitted From:  Home  Disposition: Home    Recommendations for Outpatient Follow-up:  Follow up with PCP in 1 weeks Follow up with cardiology as scheduled   Discharge Condition: Stable on room air   CODE STATUS: Full   DIET: 2 gm sodium   Brief Hospitalization Summary: Please see all hospital notes, images, labs for full details of the hospitalization. 61 y.o. female with medical history significant of chronic systolic CHF, hypothyroidism, GERD, hyperlipidemia, breast cancer status post lumpectomy and radiation (May 2017), ICD placement who presents to the emergency department due to 1 week onset of chest pain and shortness of breath which has progressively worsened within the last 3 to 4 days, she complained of nonproductive cough.  Chest pain was described as pressure/tightness and it worsens with physical activity.  Patient states symptoms were similar to when she was diagnosed with PE in 2021 and she was placed on Eliquis for 1 year (currently not on Eliquis).  Patient states that since she ate banana this morning, her symptoms have since worsened.  She denies headache, fever, chills, vomiting, abdominal pain.    ED Course: In the emergency department, she was initially bradycardic with HR ranging from 55-59, but is subsequently improved into the 60s, other vital signs were within normal range.  Work-up in the ED showed normocytic anemia, normal BMP, troponin x2 was negative, D-dimer 1.20. CT angiography of the chest with contrast showed small pulmonary emboli within the right lower lobe as described. No right heart strain is noted.  Chest x-ray showed no active cardiopulmonary disease.  Patient was started on Eliquis, hospitalist was asked to admit patient for further evaluation and management.  Hospital Course by  problem  This patient was admitted for her second bout of DVT and pulmonary embolus.  She was symptomatic on arrival but has not had a new oxygen requirement.  She has remained stable on room air.  The patient did on the apixaban which she has taken in the past and tolerated for least 1 year.  We are going to discharge her home with an apixaban starter pack and have her follow-up with her primary care provider.  She has been noted to have an acute DVT in the right lower extremity and a small pulmonary embolus.  She has remained stable and remains on room air and she can safely discharge home to continue apixaban outpatient.  I have sent prescriptions for a starter pack and will have her follow-up with her primary care provider.  She likely is going to need to be anticoagulated for life given that this is the second incidence of acute PE and DVT.  Discharge Diagnoses:  Principal Problem:   Acute pulmonary embolism (HCC) Active Problems:   GERD (gastroesophageal reflux disease)   Acquired hypothyroidism   Mixed hyperlipidemia   Chronic combined systolic and diastolic HF (heart failure) (HCC)   Obesity (BMI 30-39.9)   Acute deep vein thrombosis (DVT) of right popliteal vein [facility])   Discharge Instructions:  Allergies as of 04/13/2022       Reactions   Avelox [moxifloxacin Hcl In Nacl] Shortness Of Breath   Shortness of breath   Clindamycin/lincomycin Shortness Of Breath, Diarrhea   Breathing/diarrhea   Kiwi Extract Anaphylaxis   Makes pt. Feel like her throat is closing   Bactrim [sulfamethoxazole-trimethoprim] Swelling   Levofloxacin Other (  See Comments)   JOINT PAIN JOINT PAIN   Simvastatin Other (See Comments)   Joint pain   Spironolactone Other (See Comments)   burning        Medication List     STOP taking these medications    acetaminophen-codeine 300-30 MG tablet Commonly known as: TYLENOL #3       TAKE these medications    acetaminophen 500 MG tablet Commonly  known as: TYLENOL Take 500 mg by mouth every 6 (six) hours as needed for mild pain or moderate pain.   albuterol 108 (90 Base) MCG/ACT inhaler Commonly known as: VENTOLIN HFA Inhale 2 puffs into the lungs every 6 (six) hours as needed for wheezing or shortness of breath.   Apixaban Starter Pack ('10mg'$  and '5mg'$ ) Commonly known as: ELIQUIS STARTER PACK Take as directed on package: start with two-'5mg'$  tablets twice daily for 7 days. On day 8, switch to one-'5mg'$  tablet twice daily.   carvedilol 3.125 MG tablet Commonly known as: COREG Take 1 tablet (3.125 mg total) by mouth 2 (two) times daily.   dicyclomine 10 MG capsule Commonly known as: BENTYL Take 10 mg by mouth 3 (three) times daily before meals.   Entresto 97-103 MG Generic drug: sacubitril-valsartan Take 1 tablet by mouth 2 (two) times daily.   eplerenone 25 MG tablet Commonly known as: INSPRA Take 1 tablet (25 mg total) by mouth daily. PLEASE CALL OFFICE TO ARRANGE FOLLOW UP APPOINTMENT   levothyroxine 75 MCG tablet Commonly known as: SYNTHROID TAKE 1 TABLET BY MOUTH ONCE DAILY BEFORE BREAKFAST   omeprazole 40 MG capsule Commonly known as: PRILOSEC Take 40 mg by mouth daily.   rosuvastatin 5 MG tablet Commonly known as: CRESTOR Take 1 tablet (5 mg total) by mouth daily.        Follow-up Information     Ronnie Doss M, DO. Schedule an appointment as soon as possible for a visit in 1 week(s).   Specialty: Family Medicine Why: Hospital Follow Up Contact information: [place] [address] [phone]         [name], MD .   Specialty: Cardiology Contact information: [phone] [address] [place] [address] [phone]         Dorothy Spark, MD .   Specialty: Cardiology Contact information: [address] [place] [address] [phone]                Allergies  Allergen Reactions   Avelox [Moxifloxacin Hcl In Nacl] Shortness  Of Breath    Shortness of breath   Clindamycin/Lincomycin Shortness Of Breath and Diarrhea    Breathing/diarrhea     Kiwi Extract Anaphylaxis    Makes pt. Feel like her throat is closing    Bactrim [Sulfamethoxazole-Trimethoprim] Swelling   Levofloxacin Other (See Comments)    JOINT PAIN JOINT PAIN   Simvastatin Other (See Comments)    Joint pain   Spironolactone Other (See Comments)    burning   Allergies as of 04/13/2022       Reactions   Avelox [moxifloxacin Hcl In Nacl] Shortness Of Breath   Shortness of breath   Clindamycin/lincomycin Shortness Of Breath, Diarrhea   Breathing/diarrhea   Kiwi Extract Anaphylaxis   Makes pt. Feel like her throat is closing   Bactrim [sulfamethoxazole-trimethoprim] Swelling   Levofloxacin Other (See Comments)   JOINT PAIN JOINT PAIN   Simvastatin Other (See Comments)   Joint pain   Spironolactone Other (  See Comments)   burning        Medication List     STOP taking these medications    acetaminophen-codeine 300-30 MG tablet Commonly known as: TYLENOL #3       TAKE these medications    acetaminophen 500 MG tablet Commonly known as: TYLENOL Take 500 mg by mouth every 6 (six) hours as needed for mild pain or moderate pain.   albuterol 108 (90 Base) MCG/ACT inhaler Commonly known as: VENTOLIN HFA Inhale 2 puffs into the lungs every 6 (six) hours as needed for wheezing or shortness of breath.   Apixaban Starter Pack ('10mg'$  and '5mg'$ ) Commonly known as: ELIQUIS STARTER PACK Take as directed on package: start with two-'5mg'$  tablets twice daily for 7 days. On day 8, switch to one-'5mg'$  tablet twice daily.   carvedilol 3.125 MG tablet Commonly known as: COREG Take 1 tablet (3.125 mg total) by mouth 2 (two) times daily.   dicyclomine 10 MG capsule Commonly known as: BENTYL Take 10 mg by mouth 3 (three) times daily before meals.   Entresto 97-103 MG Generic drug: sacubitril-valsartan Take 1 tablet by mouth 2 (two) times  daily.   eplerenone 25 MG tablet Commonly known as: INSPRA Take 1 tablet (25 mg total) by mouth daily. PLEASE CALL OFFICE TO ARRANGE FOLLOW UP APPOINTMENT   levothyroxine 75 MCG tablet Commonly known as: SYNTHROID TAKE 1 TABLET BY MOUTH ONCE DAILY BEFORE BREAKFAST   omeprazole 40 MG capsule Commonly known as: PRILOSEC Take 40 mg by mouth daily.   rosuvastatin 5 MG tablet Commonly known as: CRESTOR Take 1 tablet (5 mg total) by mouth daily.        Procedures/Studies: ECHOCARDIOGRAM COMPLETE  Result Date: 04/13/2022    ECHOCARDIOGRAM REPORT   Patient Name:   [name] [name] Date of Exam: 04/13/2022 Medical Rec #:  [id number]    Height:       68.0 in Accession #:    [id number]   Weight:       198.6 lb Date of Birth:  June 27, 1961     BSA:          2.038 m Patient Age:    60 years     BP:           112/66 mmHg Patient Gender: F            HR:           63 bpm. Exam Location:  [facility] Procedure: 2D Echo, Cardiac Doppler and Color Doppler Indications:    Pulmonary embolus  History:        Patient has prior history of Echocardiogram examinations, most                 recent 02/11/2021. CHF and Cardiomyopathy, Defibrillator, TIA,                 Arrythmias:Bradycardia,                 Signs/Symptoms:Dizziness/Lightheadedness; Risk                 Factors:Dyslipidemia. Breast CA.  Sonographer:    Wenda Low Referring Phys: [id number] OLADAPO ADEFESO IMPRESSIONS  1. Left ventricular ejection fraction, by estimation, is 35 to 40%. The left ventricle has moderately decreased function. The left ventricle has no regional wall motion abnormalities. The left ventricular internal cavity size was mildly dilated. There is mild concentric left ventricular hypertrophy. Left ventricular diastolic parameters are consistent with Grade I diastolic dysfunction (  impaired relaxation).  2. Right ventricular systolic function is normal. The right ventricular size is mildly enlarged. There is normal pulmonary artery systolic  pressure. The estimated right ventricular systolic pressure is 62.0 mmHg.  3. The mitral valve is normal in structure. Mild mitral valve regurgitation. No evidence of mitral stenosis.  4. The aortic valve is tricuspid. Aortic valve regurgitation is not visualized.  5. The inferior vena cava is normal in size with greater than 50% respiratory variability, suggesting right atrial pressure of 3 mmHg. Comparison(s): No significant change from prior study. FINDINGS  Left Ventricle: Left ventricular ejection fraction, by estimation, is 35 to 40%. The left ventricle has moderately decreased function. The left ventricle has no regional wall motion abnormalities. The left ventricular internal cavity size was mildly dilated. There is mild concentric left ventricular hypertrophy. Left ventricular diastolic parameters are consistent with Grade I diastolic dysfunction (impaired relaxation). Right Ventricle: The right ventricular size is mildly enlarged. No increase in right ventricular wall thickness. Right ventricular systolic function is normal. There is normal pulmonary artery systolic pressure. The tricuspid regurgitant velocity is 2.01  m/s, and with an assumed right atrial pressure of 3 mmHg, the estimated right ventricular systolic pressure is 35.5 mmHg. Left Atrium: Left atrial size was normal in size. Right Atrium: Right atrial size was normal in size. Pericardium: Trivial pericardial effusion is present. Mitral Valve: The mitral valve is normal in structure. Mild mitral valve regurgitation. No evidence of mitral valve stenosis. MV peak gradient, 1.9 mmHg. The mean mitral valve gradient is 1.0 mmHg. Tricuspid Valve: The tricuspid valve is normal in structure. Tricuspid valve regurgitation is mild . No evidence of tricuspid stenosis. Aortic Valve: The aortic valve is tricuspid. Aortic valve regurgitation is not visualized. Aortic valve mean gradient measures 3.0 mmHg. Aortic valve peak gradient measures 4.4 mmHg. Aortic  valve area, by VTI measures 2.48 cm. Pulmonic Valve: The pulmonic valve was normal in structure. Pulmonic valve regurgitation is not visualized. No evidence of pulmonic stenosis. Aorta: The aortic root and ascending aorta are structurally normal, with no evidence of dilitation. Venous: The inferior vena cava is normal in size with greater than 50% respiratory variability, suggesting right atrial pressure of 3 mmHg. IAS/Shunts: No atrial level shunt detected by color flow Doppler. Additional Comments: A device lead is visualized in the right ventricle and right atrium.  LEFT VENTRICLE PLAX 2D LVIDd:         5.90 cm     Diastology LVIDs:         4.20 cm     LV e' medial:    3.45 cm/s LV PW:         1.00 cm     LV E/e' medial:  10.9 LV IVS:        1.10 cm     LV e' lateral:   3.90 cm/s LVOT diam:     2.20 cm     LV E/e' lateral: 9.7 LV SV:         62 LV SV Index:   31 LVOT Area:     3.80 cm  LV Volumes (MOD) LV vol d, MOD A2C: 91.3 ml LV vol d, MOD A4C: 87.0 ml LV vol s, MOD A2C: 58.8 ml LV vol s, MOD A4C: 55.6 ml LV SV MOD A2C:     32.5 ml LV SV MOD A4C:     87.0 ml LV SV MOD BP:      34.0 ml RIGHT VENTRICLE RV Basal diam:  4.20 cm RV Mid diam:    3.50 cm RV S prime:     9.93 cm/s TAPSE (M-mode): 2.2 cm LEFT ATRIUM             Index        RIGHT ATRIUM           Index LA diam:        3.60 cm 1.77 cm/m   RA Area:     17.70 cm LA Vol (A2C):   47.9 ml 23.51 ml/m  RA Volume:   56.00 ml  27.48 ml/m LA Vol (A4C):   42.9 ml 21.05 ml/m LA Biplane Vol: 48.2 ml 23.65 ml/m  AORTIC VALVE                    PULMONIC VALVE AV Area (Vmax):    2.62 cm     PV Vmax:       0.61 m/s AV Area (Vmean):   2.14 cm     PV Peak grad:  1.5 mmHg AV Area (VTI):     2.48 cm AV Vmax:           105.00 cm/s AV Vmean:          76.100 cm/s AV VTI:            0.251 m AV Peak Grad:      4.4 mmHg AV Mean Grad:      3.0 mmHg LVOT Vmax:         72.50 cm/s LVOT Vmean:        42.800 cm/s LVOT VTI:          0.164 m LVOT/AV VTI ratio: 0.65  AORTA Ao  Root diam: 3.20 cm Ao Asc diam:  3.00 cm MITRAL VALVE               TRICUSPID VALVE MV Area (PHT): 1.79 cm    TR Peak grad:   16.2 mmHg MV Area VTI:   3.69 cm    TR Vmax:        201.00 cm/s MV Peak grad:  1.9 mmHg MV Mean grad:  1.0 mmHg    SHUNTS MV Vmax:       0.69 m/s    Systemic VTI:  0.16 m MV Vmean:      32.7 cm/s   Systemic Diam: 2.20 cm MV Decel Time: 424 msec MV E velocity: 37.70 cm/s MV A velocity: 58.50 cm/s MV E/A ratio:  0.64 Rudean Haskell MD Electronically signed by Rudean Haskell MD Signature Date/Time: 04/13/2022/4:38:35 PM    Final    US Venous Img Lower Bilateral (DVT)  Result Date: 04/13/2022 CLINICAL DATA:  Acute pulmonary embolism EXAM: BILATERAL LOWER EXTREMITY VENOUS DOPPLER ULTRASOUND TECHNIQUE: Gray-scale sonography with compression, as well as color and duplex ultrasound, were performed to evaluate the deep venous system(s) from the level of the common femoral vein through the popliteal and proximal calf veins. COMPARISON:  None Available. FINDINGS: VENOUS LEFT: Normal compressibility of the common femoral, superficial femoral, and popliteal veins, as well as the visualized calf veins. Visualized portions of profunda femoral vein and great saphenous vein unremarkable. No filling defects to suggest DVT on grayscale or color Doppler imaging. Doppler waveforms show normal direction of venous flow, normal respiratory plasticity and response to augmentation. RIGHT: Normal compressibility of the common femoral, and profundus femoral veins. No filling defects to suggest DVT on grayscale or color Doppler imaging. Doppler waveforms show normal direction of venous flow, normal  respiratory plasticity and response to augmentation. In addition, mixed echogenicity thrombus is identified within the RIGHT popliteal vein with impaired spontaneous venous flow, compressibility, and augmentation consistent with deep venous thrombosis. Observed thrombus is nonocclusive. Limited visualization  of RIGHT calf veins. OTHER None. Limitations: none IMPRESSION: Acute nonocclusive deep venous thrombosis involving the RIGHT popliteal vein. No additional deep venous thrombosis is identified in either lower extremity. Electronically Signed   By: Lavonia Dana M.D.   On: 04/13/2022 09:07   CT Angio Chest PE W and/or Wo Contrast  Result Date: 04/12/2022 CLINICAL DATA:  Midsternal chest pain for 1 week EXAM: CT ANGIOGRAPHY CHEST WITH CONTRAST TECHNIQUE: Multidetector CT imaging of the chest was performed using the standard protocol during bolus administration of intravenous contrast. Multiplanar CT image reconstructions and MIPs were obtained to evaluate the vascular anatomy. RADIATION DOSE REDUCTION: This exam was performed according to the departmental dose-optimization program which includes automated exposure control, adjustment of the mA and/or kV according to patient size and/or use of iterative reconstruction technique. CONTRAST:  152m OMNIPAQUE IOHEXOL 350 MG/ML SOLN COMPARISON:  Chest x-ray from earlier in the same day. FINDINGS: Cardiovascular: Heart is enlarged in size. Thoracic aorta demonstrates atherosclerotic calcifications without aneurysmal dilatation or dissection. Pulmonary artery demonstrates a normal branching pattern. There are a few filling defects identified within the right lower lobe pulmonary artery consistent with pulmonary emboli. No evidence of right heart strain is noted. No other filling defects are seen. Pacing device is noted. Mediastinum/Nodes: Thoracic inlet is within normal limits. Changes of prior thyroid surgery are noted. No hilar or mediastinal adenopathy is seen. The esophagus as visualized is within normal limits. Lungs/Pleura: Lungs are well aerated bilaterally. No focal infiltrate or sizable effusion is seen. No sizable parenchymal nodules are noted. Upper Abdomen: Visualized upper abdomen is unremarkable. Musculoskeletal: Degenerative changes of the thoracic spine are  seen. No rib abnormality is noted. Review of the MIP images confirms the above findings. IMPRESSION: Small pulmonary emboli within the right lower lobe as described. No right heart strain is noted. No other acute abnormality is noted. Critical Value/emergent results were called by telephone at the time of interpretation on 04/12/2022 at 9:57 pm [facility] , who verbally acknowledged these results. Aortic Atherosclerosis (ICD10-I70.0). Electronically Signed   By: MInez CatalinaM.D.   On: 04/12/2022 22:00   DG Chest 2 View  Result Date: 04/12/2022 CLINICAL DATA:  chest pain. Pt presents with a 1 week hx of mid-sternal CP that is described as a constant pressure. Pt has associated dry cough and ShOB. Pt has Hx breast CA, CHF, nonsmoker, AICD EXAM: CHEST - 2 VIEW COMPARISON:  CT angiography chest 04/21/2020, chest x-ray 03/08/2020 FINDINGS: The heart and mediastinal contours are unchanged. Left chest wall dual lead pacemaker defibrillator in similar position. Biapical pleural/pulmonary scarring. No focal consolidation. No pulmonary edema. No pleural effusion. No pneumothorax. No acute osseous abnormality. IMPRESSION: No active cardiopulmonary disease. Electronically Signed   By: MIven FinnM.D.   On: 04/12/2022 17:11   CUP PACEART INCLINIC DEVICE CHECK  Result Date: 04/04/2022 ICD check in clinic. Normal device function. Thresholds and sensing consistent with previous device measurements. Impedance trends stable over time. No mode switches, few NSVT onlys. Histogram distribution appropriate for patient and level of activity. No changes made this session. Device programmed at appropriate safety margins. Device programmed to optimize intrinsic conduction. Estimated longevity ___. Pt enrolled in remote follow-up. Patient education completed including shock plan. Auditory/vibratory alert demonstrat5 years ed.  PDF did not transfer, see scanned report/office note for details    Subjective: Pt reports  that she is feeling better, breathing better, no chest pain symptoms.    Discharge Exam: Vitals:   04/13/22 0716 04/13/22 1224  BP: 112/66 (!) 112/59  Pulse: 61 64  Resp: 16 15  Temp: 97.6 F (36.4 C) 98 F (36.7 C)  SpO2: 97% 100%   Vitals:   04/13/22 0000 04/13/22 0541 04/13/22 0716 04/13/22 1224  BP: 126/85 105/63 112/66 (!) 112/59  Pulse: 74 (!) 57 61 64  Resp: '18 19 16 15  '$ Temp: 98.3 F (36.8 C) 98.3 F (36.8 C) 97.6 F (36.4 C) 98 F (36.7 C)  TempSrc: Oral  Oral Oral  SpO2: 100% 97% 97% 100%  Weight: 90.1 kg     Height:       General: Pt is alert, awake, not in acute distress Cardiovascular: normal S1/S2 +, no rubs, no gallops Respiratory: CTA bilaterally, no wheezing, no rhonchi Abdominal: Soft, NT, ND, bowel sounds + Extremities: no edema, no cyanosis   The results of significant diagnostics from this hospitalization (including imaging, microbiology, ancillary and laboratory) are listed below for reference.     Microbiology: No results found for this or any previous visit (from the past 240 hour(s)).   Labs: BNP (last 3 results) Recent Labs    06/17/21 1513  BNP 21.2   Basic Metabolic Panel: Recent Labs  Lab 04/12/22 1704 04/13/22 0435  NA 139 141  K 4.1 3.7  CL 107 110  CO2 26 27  GLUCOSE 94 93  BUN 12 14  CREATININE 0.86 0.84  CALCIUM 9.3 9.0  MG  --  2.1  PHOS  --  3.8   Liver Function Tests: Recent Labs  Lab 04/13/22 0435  AST 12*  ALT 10  ALKPHOS 42  BILITOT 0.4  PROT 6.3*  ALBUMIN 3.3*   No results for input(s): "LIPASE", "AMYLASE" in the last 168 hours. No results for input(s): "AMMONIA" in the last 168 hours. CBC: Recent Labs  Lab 04/12/22 1704 04/13/22 0435  WBC 5.0 5.5  HGB 11.4* 10.4*  HCT 35.8* 32.6*  MCV 96.2 95.6  PLT 232 194   Cardiac Enzymes: No results for input(s): "CKTOTAL", "CKMB", "CKMBINDEX", "TROPONINI" in the last 168 hours. BNP: Invalid input(s): "POCBNP" CBG: No results for input(s):  "GLUCAP" in the last 168 hours. D-Dimer Recent Labs    04/12/22 1704  DDIMER 1.20*   Hgb A1c No results for input(s): "HGBA1C" in the last 72 hours. Lipid Profile No results for input(s): "CHOL", "HDL", "LDLCALC", "TRIG", "CHOLHDL", "LDLDIRECT" in the last 72 hours. Thyroid function studies No results for input(s): "TSH", "T4TOTAL", "T3FREE", "THYROIDAB" in the last 72 hours.  Invalid input(s): "FREET3" Anemia work up No results for input(s): "VITAMINB12", "FOLATE", "FERRITIN", "TIBC", "IRON", "RETICCTPCT" in the last 72 hours. Urinalysis    Component Value Date/Time   COLORURINE YELLOW 08/24/2021 1356   APPEARANCEUR CLEAR 08/24/2021 1356   APPEARANCEUR Clear 07/13/2016 1607   LABSPEC 1.010 08/24/2021 1356   PHURINE 5.0 08/24/2021 1356   GLUCOSEU NEGATIVE 08/24/2021 1356   HGBUR SMALL (A) 08/24/2021 1356   BILIRUBINUR NEGATIVE 08/24/2021 1356   BILIRUBINUR Negative 07/13/2016 1607   KETONESUR NEGATIVE 08/24/2021 1356   PROTEINUR NEGATIVE 08/24/2021 1356   NITRITE NEGATIVE 08/24/2021 [place] 08/24/2021 1356   Sepsis Labs Recent Labs  Lab 04/12/22 1704 04/13/22 0435  WBC 5.0 5.5   Microbiology No results found for this or  any previous visit (from the past 240 hour(s)).  Time coordinating discharge:   SIGNED:  Irwin Brakeman, MD  [facility] 04/13/2022, 5:07 PM How to contact the [facility] Attending or Consulting provider [place] or covering provider during after hours [place], for this patient?  Check the care team in [facility] and look for a) attending/consulting [facility] provider listed and b) the [facility] team listed Log into [email or web address] and use [place]'s universal password to access. If you do not have the password, please contact the hospital operator. Locate the [facility] provider you are looking for under [facility] and page to a number that you can be directly reached. If you still have difficulty reaching the provider, please page the [facility]  (Director on Call) for the Hospitalists listed on amion for assistance.

## 2022-04-13 NOTE — Progress Notes (Signed)
Eliquis card given to patient at discharge.

## 2022-04-13 NOTE — Progress Notes (Signed)
*  PRELIMINARY RESULTS* Echocardiogram 2D Echocardiogram has been performed.  [name] [name] 04/13/2022, 12:37 PM

## 2022-04-14 ENCOUNTER — Telehealth: Payer: Self-pay | Admitting: Family Medicine

## 2022-04-14 NOTE — Telephone Encounter (Signed)
Transition Care Management Unsuccessful Follow-up Telephone Call  Date of discharge and from where:  04/13/22 - [facility] - acute P.[facility].  Attempts:  1st Attempt  Reason for unsuccessful TCM follow-up call:  Left voice message  I went ahead and made her TCM appt for 6/27 @ 11:50 - [place] unavailable. I still need to speak with patient for medication review and TCM questions

## 2022-04-15 NOTE — Telephone Encounter (Signed)
Transition Care Management Follow-up Telephone Call Date of discharge and from where: 04/13/2022 - [facility] acute [facility] How have you been since you were released from the hospital? Doing better, just still tired, weak, gets out of breath quickly Any questions or concerns? No  Items Reviewed: Did the pt receive and understand the discharge instructions provided? Yes  Medications obtained and verified? Yes  Other? No  Any new allergies since your discharge? No  Dietary orders reviewed? Yes Do you have support at home? Yes   Home Care and Equipment/Supplies: Were home health services ordered? no Were any new equipment or medical supplies ordered?  No  Functional Questionnaire: (I = Independent and D = Dependent) ADLs: I  Bathing/Dressing- I  Meal Prep- I  Eating- I  Maintaining continence- I  Transferring/Ambulation- I  Managing Meds- I  Follow up appointments reviewed:  PCP Hospital f/u appt confirmed? Yes  Scheduled to see [facility] Nadine Counts has no openings) on 04/19/22 @ 11:50. Specialist Hospital f/u appt confirmed? Yes  Scheduled to see Bensimhon on 04/20/2022 @ 8. Are transportation arrangements needed? No  If their condition worsens, is the pt aware to call PCP or go to the Emergency Dept.? Yes Was the patient provided with contact information for the PCP's office or ED? Yes Was to pt encouraged to call back with questions or concerns? Yes

## 2022-04-19 ENCOUNTER — Encounter: Payer: Self-pay | Admitting: Family Medicine

## 2022-04-19 ENCOUNTER — Ambulatory Visit (INDEPENDENT_AMBULATORY_CARE_PROVIDER_SITE_OTHER): Payer: 59 | Admitting: Family Medicine

## 2022-04-19 VITALS — BP 114/73 | HR 65 | Temp 97.8°F | Ht 68.0 in | Wt 204.2 lb

## 2022-04-19 DIAGNOSIS — H66005 Acute suppurative otitis media without spontaneous rupture of ear drum, recurrent, left ear: Secondary | ICD-10-CM | POA: Diagnosis not present

## 2022-04-19 DIAGNOSIS — I2699 Other pulmonary embolism without acute cor pulmonale: Secondary | ICD-10-CM

## 2022-04-19 DIAGNOSIS — J0141 Acute recurrent pansinusitis: Secondary | ICD-10-CM | POA: Diagnosis not present

## 2022-04-19 MED ORDER — ONDANSETRON 8 MG PO TBDP: 8.0000 mg | ORAL_TABLET | Freq: Four times a day (QID) | ORAL | 1 refills | Status: DC | PRN

## 2022-04-19 MED ORDER — AMOXICILLIN-POT CLAVULANATE 875-125 MG PO TABS: 1.0000 | ORAL_TABLET | Freq: Two times a day (BID) | ORAL | 0 refills | Status: DC

## 2022-04-19 MED ORDER — APIXABAN 5 MG PO TABS: 5.0000 mg | ORAL_TABLET | Freq: Two times a day (BID) | ORAL | 1 refills | Status: DC

## 2022-04-20 ENCOUNTER — Ambulatory Visit (HOSPITAL_COMMUNITY)
Admission: RE | Admit: 2022-04-20 | Discharge: 2022-04-20 | Disposition: A | Discharge status: Home / Self Care | Payer: 59 | Source: Ambulatory Visit | Attending: Internal Medicine | Admitting: Internal Medicine

## 2022-04-20 ENCOUNTER — Other Ambulatory Visit (HOSPITAL_COMMUNITY): Payer: Self-pay | Admitting: Internal Medicine

## 2022-04-20 DIAGNOSIS — I081 Rheumatic disorders of both mitral and tricuspid valves: Secondary | ICD-10-CM | POA: Diagnosis not present

## 2022-04-20 DIAGNOSIS — I5022 Chronic systolic (congestive) heart failure: Secondary | ICD-10-CM | POA: Insufficient documentation

## 2022-04-20 DIAGNOSIS — Z95810 Presence of automatic (implantable) cardiac defibrillator: Secondary | ICD-10-CM | POA: Insufficient documentation

## 2022-04-20 DIAGNOSIS — E785 Hyperlipidemia, unspecified: Secondary | ICD-10-CM | POA: Diagnosis not present

## 2022-04-20 LAB — CMP14+EGFR
ALT: 10 IU/L (ref 0–32)
AST: 14 IU/L (ref 0–40)
Albumin/Globulin Ratio: 1.8 (ref 1.2–2.2)
Albumin: 4.3 g/dL (ref 3.8–4.8)
Alkaline Phosphatase: 57 IU/L (ref 44–121)
BUN/Creatinine Ratio: 14 (ref 12–28)
BUN: 11 mg/dL (ref 8–27)
Bilirubin Total: <0.2 mg/dL (ref 0.0–1.2)
CO2: 25 mmol/L (ref 20–29)
Calcium: 9.6 mg/dL (ref 8.7–10.3)
Chloride: 104 mmol/L (ref 96–106)
Creatinine, Ser: 0.79 mg/dL (ref 0.57–1.00)
Globulin, Total: 2.4 g/dL (ref 1.5–4.5)
Glucose: 83 mg/dL (ref 70–99)
Potassium: 4.6 mmol/L (ref 3.5–5.2)
Sodium: 142 mmol/L (ref 134–144)
Total Protein: 6.7 g/dL (ref 6.0–8.5)
eGFR: 85 mL/min/{1.73_m2} (ref >59)

## 2022-04-20 LAB — CBC WITH DIFFERENTIAL/PLATELET
Basophils Absolute: 0.1 10*3/uL (ref 0.0–0.2)
Basos: 1 %
EOS (ABSOLUTE): 0.3 10*3/uL (ref 0.0–0.4)
Eos: 7 %
Hematocrit: 34.4 % (ref 34.0–46.6)
Hemoglobin: 11.3 g/dL (ref 11.1–15.9)
Immature Grans (Abs): 0 10*3/uL (ref 0.0–0.1)
Immature Granulocytes: 0 %
Lymphocytes Absolute: 2 10*3/uL (ref 0.7–3.1)
Lymphs: 42 %
MCH: 30.8 pg (ref 26.6–33.0)
MCHC: 32.8 g/dL (ref 31.5–35.7)
MCV: 94 fL (ref 79–97)
Monocytes Absolute: 0.3 10*3/uL (ref 0.1–0.9)
Monocytes: 7 %
Neutrophils Absolute: 2 10*3/uL (ref 1.4–7.0)
Neutrophils: 43 %
Platelets: 232 10*3/uL (ref 150–450)
RBC: 3.67 x10E6/uL — ABNORMAL LOW (ref 3.77–5.28)
RDW: 12.4 % (ref 11.7–15.4)
WBC: 4.7 10*3/uL (ref 3.4–10.8)

## 2022-04-20 LAB — ECHOCARDIOGRAM LIMITED
Area-P 1/2: 3.27 cm2
Calc EF: 38.8 %
S' Lateral: 4.5 cm
Single Plane A2C EF: 36.4 %
Single Plane A4C EF: 42.1 %

## 2022-04-22 NOTE — Progress Notes (Signed)
Hello [name] [name],  Your lab result is normal and/or stable.Some minor variations that are not significant are commonly marked abnormal, but do not represent any medical problem for you.  Best regards, Claretta Fraise, M.D.

## 2022-05-03 ENCOUNTER — Other Ambulatory Visit: Payer: Self-pay | Admitting: *Deleted

## 2022-05-03 ENCOUNTER — Encounter: Payer: Self-pay | Admitting: Hematology and Oncology

## 2022-05-03 DIAGNOSIS — C50212 Malignant neoplasm of upper-inner quadrant of left female breast: Secondary | ICD-10-CM

## 2022-05-05 ENCOUNTER — Ambulatory Visit: Payer: 59

## 2022-05-06 ENCOUNTER — Other Ambulatory Visit: Payer: Self-pay | Admitting: *Deleted

## 2022-05-06 ENCOUNTER — Ambulatory Visit (HOSPITAL_COMMUNITY)
Admission: RE | Admit: 2022-05-06 | Discharge: 2022-05-06 | Disposition: A | Discharge status: Home / Self Care | Payer: 59 | Source: Ambulatory Visit | Attending: Family Medicine | Admitting: Family Medicine

## 2022-05-06 ENCOUNTER — Encounter (HOSPITAL_COMMUNITY): Payer: Self-pay

## 2022-05-06 VITALS — BP 114/76 | HR 73 | Wt 205.0 lb

## 2022-05-06 DIAGNOSIS — I5022 Chronic systolic (congestive) heart failure: Secondary | ICD-10-CM | POA: Insufficient documentation

## 2022-05-06 DIAGNOSIS — Z8673 Personal history of transient ischemic attack (TIA), and cerebral infarction without residual deficits: Secondary | ICD-10-CM | POA: Diagnosis not present

## 2022-05-06 DIAGNOSIS — I110 Hypertensive heart disease with heart failure: Secondary | ICD-10-CM | POA: Diagnosis not present

## 2022-05-06 DIAGNOSIS — Z79890 Hormone replacement therapy (postmenopausal): Secondary | ICD-10-CM | POA: Diagnosis not present

## 2022-05-06 DIAGNOSIS — Z7901 Long term (current) use of anticoagulants: Secondary | ICD-10-CM | POA: Insufficient documentation

## 2022-05-06 DIAGNOSIS — I10 Essential (primary) hypertension: Secondary | ICD-10-CM | POA: Diagnosis not present

## 2022-05-06 DIAGNOSIS — Z95810 Presence of automatic (implantable) cardiac defibrillator: Secondary | ICD-10-CM | POA: Insufficient documentation

## 2022-05-06 DIAGNOSIS — R0683 Snoring: Secondary | ICD-10-CM | POA: Insufficient documentation

## 2022-05-06 DIAGNOSIS — E039 Hypothyroidism, unspecified: Secondary | ICD-10-CM | POA: Insufficient documentation

## 2022-05-06 DIAGNOSIS — G4700 Insomnia, unspecified: Secondary | ICD-10-CM | POA: Insufficient documentation

## 2022-05-06 DIAGNOSIS — Z6831 Body mass index (BMI) 31.0-31.9, adult: Secondary | ICD-10-CM | POA: Diagnosis not present

## 2022-05-06 DIAGNOSIS — Z79899 Other long term (current) drug therapy: Secondary | ICD-10-CM | POA: Diagnosis not present

## 2022-05-06 DIAGNOSIS — I428 Other cardiomyopathies: Secondary | ICD-10-CM | POA: Diagnosis not present

## 2022-05-06 DIAGNOSIS — Z8616 Personal history of COVID-19: Secondary | ICD-10-CM | POA: Insufficient documentation

## 2022-05-06 DIAGNOSIS — Z853 Personal history of malignant neoplasm of breast: Secondary | ICD-10-CM | POA: Diagnosis not present

## 2022-05-06 DIAGNOSIS — E669 Obesity, unspecified: Secondary | ICD-10-CM | POA: Insufficient documentation

## 2022-05-06 DIAGNOSIS — I2699 Other pulmonary embolism without acute cor pulmonale: Secondary | ICD-10-CM | POA: Diagnosis not present

## 2022-05-06 MED ORDER — CARVEDILOL 3.125 MG PO TABS: 3.1250 mg | ORAL_TABLET | Freq: Two times a day (BID) | ORAL | 1 refills | Status: DC

## 2022-05-06 MED ORDER — APIXABAN 5 MG PO TABS: 5.0000 mg | ORAL_TABLET | Freq: Two times a day (BID) | ORAL | 2 refills | Status: DC

## 2022-05-06 MED ORDER — EPLERENONE 25 MG PO TABS: 25.0000 mg | ORAL_TABLET | Freq: Every day | ORAL | 6 refills | Status: DC

## 2022-05-06 MED ORDER — ENTRESTO 97-103 MG PO TABS: 1.0000 | ORAL_TABLET | Freq: Two times a day (BID) | ORAL | 6 refills | Status: DC

## 2022-05-06 NOTE — Progress Notes (Signed)
ADVANCED HF CLINIC NOTE PCP: Dr. Lajuana Ripple Primary Cardiologist: Dr. [name] HF Cardiologist: Dr. [name]  HPI:  [name] [name] is a 61 y.o. female with breast cancer (s/p lumpectomy and XRT - refused chemo), hypothyroidism referred by Dr. [name] for further evaluation of systolic HF due to NICM. EF ~30%   She has long h/o NICM dating back to 2007. Cath in 2017 with normal cors and EF 25-30%. S/p s-ICD. Medical therapy has been limited by multiple medication intolerances (Spironolactone caused body burning sensation, could not afford Bidil and had fatigue with high does of Entresto).   Had COVID PNA in 1/21. Was treated at home. Her mother passed from [place].   Echo 1/21 EF 25-30% RV normal   Dr. [name] saw her for first visit in 2/21. Was feeling well. Entresto increased to 49/51 bid  We got CPX which showed normal functional capacity.   CPX 12/19/19 FVC 2.75 (87%)      FEV1 2.14 (86%)        FEV1/FVC 78 (98%)        MVV 105 (104%)   Resting HR: 85 Standing HR: 86 Peak HR: 161   (100% age predicted max HR)  BP rest: 128/84 Standing BP: 116/82 BP peak: 166/72  Peak VO2: 17.7 (97%% predicted peak VO2)  - adjusted for ibw peak VO2 is 24.7 ml/kg (ibw)/min VE/VCO2 slope:  29  Peak RER: 1.08  O2pulse:  11   (100% predicted O2pulse)   Echo 4/22 EF 88-50%, RV systolic fxn moderately reduced  Last seen 8/22, NYHA II-III. No med changes, doing well.   Admitted 6/23 with CP and SOB, CT chest showed small RLL PE. Started on Eliquis. Echo showed EF 35-40%, RV OK.   Today she returns for HF follow up. Overall feeling fair. Main complaint is insomnia. She has SOB with ADLs, leans on grocery cart when doing food shopping. She is SOB going up stairs. Denies abnormal bleeding, CP, dizziness, edema, or PND/Orthopnea. Appetite ok. No fever or chills. Weight at home 203 pounds. Taking all medications.   ROS: All systems reviewed and negative except as per HPI.   Past Medical History:   Diagnosis Date   AICD (automatic cardioverter/defibrillator) present 09   [employer]- replaced 2016   Arthritis    Bradycardia    Breast cancer ([place]) 02/24/2016   s/p lumpectomy and radiation but refused chemotherapy   Chronic systolic CHF (congestive heart failure) (HCC)    Cough due to angiotensin-converting enzyme inhibitor    Dizziness    Dizziness with standing after squatting, March, 2012   Dyslipidemia    LDL elevation,Patient does not want statin   Family history of breast cancer    GERD (gastroesophageal reflux disease)    H/O shortness of breath    CPX 10/18:  No evidence of cardiopulmonary limitation. Suspect exercise intolerance related to weight and deconditioning.    Hypothyroidism    ICD (implantable cardiac defibrillator) battery depletion    Dr. [name], June, 2009(artifactual atrial tachycardia response events addressed by reprogramming in parentheses   Leg swelling    right leg   Mitral regurgitation    Nonischemic cardiomyopathy ([place])    Etiology unknown, diagnosed 2008   Personal history of radiation therapy 2017   Pneumonia due to COVID-19 virus 10/2019   TIA (transient ischemic attack)    No CT or MRI abnormality, aspirin therapy   Current Outpatient Medications  Medication Sig Dispense Refill   acetaminophen (TYLENOL) 500 MG tablet Take 500 mg  by mouth every 6 (six) hours as needed for mild pain or moderate pain.     albuterol (VENTOLIN HFA) 108 (90 Base) MCG/ACT inhaler Inhale 2 puffs into the lungs every 6 (six) hours as needed for wheezing or shortness of breath. 8 g 2   apixaban (ELIQUIS) 5 MG TABS tablet Take 1 tablet (5 mg total) by mouth 2 (two) times daily. 180 tablet 1   carvedilol (COREG) 3.125 MG tablet Take 1 tablet (3.125 mg total) by mouth 2 (two) times daily. 180 tablet 3   dicyclomine (BENTYL) 10 MG capsule Take 10 mg by mouth 3 (three) times daily before meals.      eplerenone (INSPRA) 25 MG tablet Take 1 tablet (25 mg total) by  mouth daily. PLEASE CALL OFFICE TO ARRANGE FOLLOW UP APPOINTMENT 30 tablet 2   levothyroxine (SYNTHROID) 75 MCG tablet TAKE 1 TABLET BY MOUTH ONCE DAILY BEFORE BREAKFAST 90 tablet 2   omeprazole (PRILOSEC) 40 MG capsule Take 40 mg by mouth daily.     rosuvastatin (CRESTOR) 5 MG tablet Take 1 tablet (5 mg total) by mouth daily. 30 tablet 11   sacubitril-valsartan (ENTRESTO) 97-103 MG Take 1 tablet by mouth 2 (two) times daily. 60 tablet 1   No current facility-administered medications for this encounter.   Allergies  Allergen Reactions   Avelox [Moxifloxacin Hcl In Nacl] Shortness Of Breath    Shortness of breath   Clindamycin/Lincomycin Shortness Of Breath and Diarrhea    Breathing/diarrhea     Kiwi Extract Anaphylaxis    Makes pt. Feel like her throat is closing    Bactrim [Sulfamethoxazole-Trimethoprim] Swelling   Levofloxacin Other (See Comments)    JOINT PAIN JOINT PAIN   Simvastatin Other (See Comments)    Joint pain   Spironolactone Other (See Comments)    burning   Avocado    Social History   Socioeconomic History   Marital status: Widowed    Spouse name: Not on file   Number of children: Not on file   Years of education: Not on file   Highest education level: Not on file  Occupational History   Occupation: Widow  Tobacco Use   Smoking status: Never   Smokeless tobacco: Never  Vaping Use   Vaping Use: Never used  Substance and Sexual Activity   Alcohol use: No   Drug use: No   Sexual activity: Not on file  Other Topics Concern   Not on file  Social History Narrative   Not on file   Social Determinants of Health   Financial Resource Strain: Medium Risk (12/11/2019)   Overall Financial Resource Strain (CARDIA)    Difficulty of Paying Living Expenses: Somewhat hard  Food Insecurity: Not on file  Transportation Needs: No Transportation Needs (12/11/2019)   PRAPARE - Hydrologist (Medical): No    Lack of Transportation  (Non-Medical): No  Physical Activity: Insufficiently Active (12/11/2019)   Exercise Vital Sign    Days of Exercise per Week: 1 day    Minutes of Exercise per Session: 10 min  Stress: Not on file  Social Connections: Not on file  Intimate Partner Violence: Not on file    Family History  Problem Relation Age of Onset   Heart attack Mother    Cancer Mother        breast   Breast cancer Mother 56   Hypertension Sister    Hypertension Brother    Cancer Maternal Aunt  breast   Breast cancer Maternal Aunt    Cancer Paternal Grandmother        possible cancer, unknown type   Stroke Neg Hx    Diabetes Neg Hx     BP 114/76   Pulse 73   Wt 93 kg (205 lb)   SpO2 98%   BMI 31.17 kg/m   Wt Readings from Last 3 Encounters:  05/06/22 93 kg (205 lb)  04/19/22 92.6 kg (204 lb 3.2 oz)  04/13/22 90.1 kg (198 lb 10.2 oz)   PHYSICAL EXAM: General:  NAD. No resp difficulty HEENT: Normal Neck: Supple. No JVD. Carotids 2+ bilat; no bruits. No lymphadenopathy or thryomegaly appreciated. Cor: PMI nondisplaced. Regular rate & rhythm. No rubs, gallops or murmurs. Lungs: Clear Abdomen: Soft, nontender, nondistended. No hepatosplenomegaly. No bruits or masses. Good bowel sounds. Extremities: No cyanosis, clubbing, rash, edema Neuro: Alert & oriented x 3, cranial nerves grossly intact. Moves all 4 extremities w/o difficulty. Affect pleasant.  ECG (personally reviewed from 04/13/22): NSR 67 bpm, narrow QRS  ICD interrogation (personally reviewed): No VT, average HR 68 bpm, 0.9 hr/day activity.  ASSESSMENT & PLAN: 1. Chronic systolic HF - longstanding NICM dating back to 2007. Unclear etiology. - Cath in (2017) with normal cors and EF 25-30%. S/p s-ICD - Echo 11/20 read as 35-40% (likely 30-35%)  - Echo (1/21) EF 25-30%  RV normal  (post-COVID) - Echo (4/22): EF 30-35% RV moderately down  - CPX testing (2/21) with normal functional capacity when corrected for ibw. No obvious HF  limitation. - Echo (6/23) EF 35-40%, RV ok. - NYHA II-III. Volume status ok.  - GDMT limited by multiple medication intolerances (Could not afford Bidil). - Continue Entresto 97/103 bid. - Continue carvedilol 3.125 bid.  - Continue eplerenone 25 mg daily (spiro caused burning sensation). - No SGLT2i given her propensity for yeast infections.  - ICD interrogated in clinic today as above. - Recent labs at PCP reviewed and stable. SCr 0.79, K 4.6, Hgb 11.3 (04/19/22) - ? Barostim candidate if symptoms worsen and EF decreases < 35%. QRS narrow so no CRT-D.  2. HTN - Blood pressure well controlled.  - Continue current regimen.  3. PE/DVT - Continue Eliquis 5 mg bid. No bleeding issues. - Recent CBC stable.  4. Snoring - Sleep study (2/22): AHI 2.0 no need for CPAP.  5. Obesity  - Body mass index is 31.17 kg/m. - Congratulated on weight loss.   Follow up in 6 months with Dr. [name].  Rafael Bihari, FNP  9:22 AM

## 2022-05-06 NOTE — Patient Instructions (Signed)
Thank you for coming in today  No labs were done today  Your Heart Failure medications have been refilled   Your physician recommends that you schedule a follow-up appointment in:  6 months with Dr. [name]  At the [facility], you and your health needs are our priority. As part of our continuing mission to provide you with exceptional heart care, we have created designated Provider Care Teams. These Care Teams include your primary Cardiologist (physician) and Advanced Practice Providers (APPs- Physician Assistants and Nurse Practitioners) who all work together to provide you with the care you need, when you need it.   You may see any of the following providers on your designated Care Team at your next follow up: Dr Glori Bickers Dr Haynes Kerns, NP Lyda Jester, [place] [facility] [place], [place] Audry Riles, PharmD   Please be sure to bring in all your medications bottles to every appointment.   If you have any questions or concerns before your next appointment please send [place] a message through [place] or call our office at [phone].    TO LEAVE A MESSAGE FOR THE NURSE SELECT OPTION 2, PLEASE LEAVE A MESSAGE INCLUDING: YOUR NAME DATE OF BIRTH CALL BACK NUMBER REASON FOR CALL**this is important as we prioritize the call backs  YOU WILL RECEIVE A CALL BACK THE SAME DAY AS LONG AS YOU CALL BEFORE 4:00 PM

## 2022-05-09 ENCOUNTER — Ambulatory Visit
Admission: RE | Admit: 2022-05-09 | Discharge: 2022-05-09 | Disposition: A | Discharge status: Home / Self Care | Payer: 59 | Source: Ambulatory Visit | Attending: Family Medicine | Admitting: Family Medicine

## 2022-05-09 DIAGNOSIS — Z1231 Encounter for screening mammogram for malignant neoplasm of breast: Secondary | ICD-10-CM | POA: Diagnosis not present

## 2022-05-12 ENCOUNTER — Ambulatory Visit (INDEPENDENT_AMBULATORY_CARE_PROVIDER_SITE_OTHER): Payer: BC Managed Care – PPO

## 2022-05-12 DIAGNOSIS — I428 Other cardiomyopathies: Secondary | ICD-10-CM

## 2022-05-13 LAB — CUP PACEART REMOTE DEVICE CHECK
Battery Remaining Longevity: 60 mo
Battery Remaining Percentage: 73 %
Brady Statistic RA Percent Paced: 1 %
Brady Statistic RV Percent Paced: 1 %
Date Time Interrogation Session: 20230720131500
HighPow Impedance: 52 Ohm
Implantable Lead Implant Date: 20090320
Implantable Lead Implant Date: 20090320
Implantable Lead Location: 753859
Implantable Lead Location: 753860
Implantable Lead Model: 158
Implantable Lead Model: 5076
Implantable Lead Serial Number: 182504
Implantable Pulse Generator Implant Date: 20160307
Lead Channel Impedance Value: 500 Ohm
Lead Channel Impedance Value: 535 Ohm
Lead Channel Pacing Threshold Amplitude: 0.5 V
Lead Channel Pacing Threshold Amplitude: 1.2 V
Lead Channel Pacing Threshold Pulse Width: 0.4 ms
Lead Channel Pacing Threshold Pulse Width: 0.4 ms
Lead Channel Setting Pacing Amplitude: 2 V
Lead Channel Setting Pacing Amplitude: 2.5 V
Lead Channel Setting Pacing Pulse Width: 0.4 ms
Lead Channel Setting Sensing Sensitivity: 0.6 mV
Pulse Gen Serial Number: 111826

## 2022-05-17 ENCOUNTER — Encounter: Payer: Self-pay | Admitting: Family Medicine

## 2022-05-17 ENCOUNTER — Ambulatory Visit (INDEPENDENT_AMBULATORY_CARE_PROVIDER_SITE_OTHER): Payer: 59 | Admitting: Family Medicine

## 2022-05-17 VITALS — BP 104/72 | HR 77 | Temp 97.3°F | Ht 68.0 in | Wt 204.8 lb

## 2022-05-17 DIAGNOSIS — E890 Postprocedural hypothyroidism: Secondary | ICD-10-CM

## 2022-05-17 DIAGNOSIS — R0602 Shortness of breath: Secondary | ICD-10-CM | POA: Diagnosis not present

## 2022-05-17 DIAGNOSIS — N3941 Urge incontinence: Secondary | ICD-10-CM | POA: Insufficient documentation

## 2022-05-17 DIAGNOSIS — R21 Rash and other nonspecific skin eruption: Secondary | ICD-10-CM | POA: Diagnosis not present

## 2022-05-17 DIAGNOSIS — I2699 Other pulmonary embolism without acute cor pulmonale: Secondary | ICD-10-CM | POA: Diagnosis not present

## 2022-05-17 DIAGNOSIS — I82409 Acute embolism and thrombosis of unspecified deep veins of unspecified lower extremity: Secondary | ICD-10-CM | POA: Diagnosis not present

## 2022-05-17 DIAGNOSIS — Z7901 Long term (current) use of anticoagulants: Secondary | ICD-10-CM

## 2022-05-17 MED ORDER — ALBUTEROL SULFATE HFA 108 (90 BASE) MCG/ACT IN AERS: 2.0000 | INHALATION_SPRAY | Freq: Four times a day (QID) | RESPIRATORY_TRACT | 2 refills | Status: DC | PRN

## 2022-05-17 MED ORDER — LEVOTHYROXINE SODIUM 75 MCG PO TABS: 75.0000 ug | ORAL_TABLET | Freq: Every day | ORAL | 12 refills | Status: DC

## 2022-05-17 NOTE — Progress Notes (Signed)
Subjective: CC: Follow-up PE/DVT PCP: Janora Norlander, DO [id number] [name] [name] is a 61 y.o. female presenting to clinic today for:  1.  PE/DVT Patient sustained a recurrent DVT in the right lower extremity and subsequent pulmonary embolism in the RLL.  Compliant with Eliquis.  No bleeding reported. She does report an itchy rash under her arms but is not sure if related to eliquis or not.  No bleeding or pain.  Using hypoallergenic antiperspirant and she does shave her underarms  2.  Urinary incontinence She also notes that she has had a couple bouts of urinary incontinence is described as urge incontinence with 1 episode of overflow incontinence.  She is not performing any type of Kegel exercises  3.  Hypothyroidism Compliant with Synthroid.  No reports of tremor, heart palpitations.  She follows up with her heart doctor and oncologist as directed   ROS: Per HPI  Allergies  Allergen Reactions   Avelox [Moxifloxacin Hcl In Nacl] Shortness Of Breath    Shortness of breath   Clindamycin/Lincomycin Shortness Of Breath and Diarrhea    Breathing/diarrhea     Kiwi Extract Anaphylaxis    Makes pt. Feel like her throat is closing    Bactrim [Sulfamethoxazole-Trimethoprim] Swelling   Levofloxacin Other (See Comments)    JOINT PAIN JOINT PAIN   Simvastatin Other (See Comments)    Joint pain   Spironolactone Other (See Comments)    burning   Avocado    Past Medical History:  Diagnosis Date   AICD (automatic cardioverter/defibrillator) present 09   [employer]- replaced 2016   Arthritis    Bradycardia    Breast cancer ([place]) 02/24/2016   s/p lumpectomy and radiation but refused chemotherapy   Chronic systolic CHF (congestive heart failure) (HCC)    Cough due to angiotensin-converting enzyme inhibitor    Dizziness    Dizziness with standing after squatting, March, 2012   Dyslipidemia    LDL elevation,Patient does not want statin   Family history of breast cancer     GERD (gastroesophageal reflux disease)    H/O shortness of breath    CPX 10/18:  No evidence of cardiopulmonary limitation. Suspect exercise intolerance related to weight and deconditioning.    Hypothyroidism    ICD (implantable cardiac defibrillator) battery depletion    Dr. [name], June, 2009(artifactual atrial tachycardia response events addressed by reprogramming in parentheses   Leg swelling    right leg   Mitral regurgitation    Nonischemic cardiomyopathy ([place])    Etiology unknown, diagnosed 2008   Personal history of radiation therapy 2017   Pneumonia due to COVID-19 virus 10/2019   TIA (transient ischemic attack)    No CT or MRI abnormality, aspirin therapy    Current Outpatient Medications:    acetaminophen (TYLENOL) 500 MG tablet, Take 500 mg by mouth every 6 (six) hours as needed for mild pain or moderate pain., Disp: , Rfl:    albuterol (VENTOLIN HFA) 108 (90 Base) MCG/ACT inhaler, Inhale 2 puffs into the lungs every 6 (six) hours as needed for wheezing or shortness of breath., Disp: 8 g, Rfl: 2   apixaban (ELIQUIS) 5 MG TABS tablet, Take 1 tablet (5 mg total) by mouth 2 (two) times daily., Disp: 180 tablet, Rfl: 2   carvedilol (COREG) 3.125 MG tablet, Take 1 tablet (3.125 mg total) by mouth 2 (two) times daily., Disp: 180 tablet, Rfl: 1   dicyclomine (BENTYL) 10 MG capsule, Take 10 mg by mouth 3 (three) times daily  before meals. , Disp: , Rfl:    eplerenone (INSPRA) 25 MG tablet, Take 1 tablet (25 mg total) by mouth daily. PLEASE CALL OFFICE TO ARRANGE FOLLOW UP APPOINTMENT, Disp: 30 tablet, Rfl: 6   levothyroxine (SYNTHROID) 75 MCG tablet, TAKE 1 TABLET BY MOUTH ONCE DAILY BEFORE BREAKFAST, Disp: 90 tablet, Rfl: 2   omeprazole (PRILOSEC) 40 MG capsule, Take 40 mg by mouth daily., Disp: , Rfl:    rosuvastatin (CRESTOR) 5 MG tablet, Take 1 tablet (5 mg total) by mouth daily., Disp: 30 tablet, Rfl: 11   sacubitril-valsartan (ENTRESTO) 97-103 MG, Take 1 tablet by mouth 2 (two)  times daily., Disp: 60 tablet, Rfl: 6 Social History   Socioeconomic History   Marital status: Widowed    Spouse name: Not on file   Number of children: Not on file   Years of education: Not on file   Highest education level: Not on file  Occupational History   Occupation: Widow  Tobacco Use   Smoking status: Never   Smokeless tobacco: Never  Vaping Use   Vaping Use: Never used  Substance and Sexual Activity   Alcohol use: No   Drug use: No   Sexual activity: Not on file  Other Topics Concern   Not on file  Social History Narrative   Not on file   Social Determinants of Health   Financial Resource Strain: Medium Risk (12/11/2019)   Overall Financial Resource Strain (CARDIA)    Difficulty of Paying Living Expenses: Somewhat hard  Food Insecurity: Not on file  Transportation Needs: No Transportation Needs (12/11/2019)   PRAPARE - Hydrologist (Medical): No    Lack of Transportation (Non-Medical): No  Physical Activity: Insufficiently Active (12/11/2019)   Exercise Vital Sign    Days of Exercise per Week: 1 day    Minutes of Exercise per Session: 10 min  Stress: Not on file  Social Connections: Not on file  Intimate Partner Violence: Not on file   Family History  Problem Relation Age of Onset   Heart attack Mother    Cancer Mother        breast   Breast cancer Mother 76   Hypertension Sister    Hypertension Brother    Cancer Maternal Aunt        breast   Breast cancer Maternal Aunt    Cancer Paternal Grandmother        possible cancer, unknown type   Stroke Neg Hx    Diabetes Neg Hx     Objective: Office vital signs reviewed. BP 104/72   Pulse 77   Temp (!) 97.3 F (36.3 C)   Ht '5\' 8"'$  (1.727 m)   Wt 204 lb 12.8 oz (92.9 kg)   SpO2 95%   BMI 31.14 kg/m   Physical Examination:  General: Awake, alert, well nourished, No acute distress HEENT: Sclera white.  Moist mucous membranes Cardio: regular rate and rhythm, S1S2 heard,  no murmurs appreciated Pulm: clear to auscultation bilaterally, no wheezes, rhonchi or rales; normal work of breathing on room air Extremities: warm, well perfused, No edema, cyanosis or clubbing; +2 pulses bilaterally MSK: Ambulating independently Skin: Slight hyperemia and hyperpigmentation noted along the axillary regions bilaterally.  No skin breakdown.  Nonfluctuant.  Nondraining.  Assessment/ Plan: 61 y.o. female   Recurrent deep vein thrombosis (DVT) ([place])  Other acute pulmonary embolism without acute cor pulmonale (HCC)  Anticoagulated  SOB (shortness of breath) - Plan: albuterol (VENTOLIN HFA) 108 (  67 Base) MCG/ACT inhaler  Post-surgical hypothyroidism - Plan: levothyroxine (SYNTHROID) 75 MCG tablet  Rash and nonspecific skin eruption  Urge incontinence of urine  Recovering from DVT/PE.  Still having some shortness of breath with exertion.  Continue anticoagulant as directed.  We will cc Dr. Lindi Adie.  Requested medications have been renewed.    No reports of symptomology from a thyroid standpoint.Plan for thyroid labs at her next visit in a few months along with rest of physical/fasting labs  Rash looks to be intertriginous.  No evidence of candidal infection.  Discussed ways to keep this area dry and reduce risk of progressing any type of infection.  Avoid shaving the area for now  Reinforced Kegel exercises.  Could consider medication or referral but she really does not want to take anymore medications if can be avoided  No orders of the defined types were placed in this encounter.  No orders of the defined types were placed in this encounter.    Janora Norlander, DO [place] [phone]

## 2022-05-26 ENCOUNTER — Encounter: Payer: 59 | Admitting: Adult Health

## 2022-06-03 NOTE — Progress Notes (Signed)
Remote ICD transmission.   

## 2022-06-13 ENCOUNTER — Encounter: Payer: Self-pay | Admitting: Family Medicine

## 2022-06-13 ENCOUNTER — Ambulatory Visit (HOSPITAL_COMMUNITY)
Admission: RE | Admit: 2022-06-13 | Discharge: 2022-06-13 | Disposition: A | Discharge status: Home / Self Care | Payer: 59 | Source: Ambulatory Visit | Attending: Family Medicine | Admitting: Family Medicine

## 2022-06-13 ENCOUNTER — Telehealth (INDEPENDENT_AMBULATORY_CARE_PROVIDER_SITE_OTHER): Payer: 59 | Admitting: Family Medicine

## 2022-06-13 DIAGNOSIS — M7989 Other specified soft tissue disorders: Secondary | ICD-10-CM | POA: Diagnosis not present

## 2022-06-13 DIAGNOSIS — Z86718 Personal history of other venous thrombosis and embolism: Secondary | ICD-10-CM

## 2022-06-13 DIAGNOSIS — M79604 Pain in right leg: Secondary | ICD-10-CM | POA: Insufficient documentation

## 2022-06-13 DIAGNOSIS — M79661 Pain in right lower leg: Secondary | ICD-10-CM | POA: Diagnosis not present

## 2022-06-13 NOTE — Progress Notes (Signed)
MyChart Video visit  Subjective: CC:leg pain PCP: Janora Norlander, DO WGN:FAOZH MARLI DIEGO is a 61 y.o. female. Patient provides verbal consent for consult held via video.  Due to COVID-19 pandemic this visit was conducted virtually. This visit type was conducted due to national recommendations for restrictions regarding the COVID-19 Pandemic (e.g. social distancing, sheltering in place) in an effort to limit this patient's exposure and mitigate transmission in our community. All issues noted in this document were discussed and addressed.  A physical exam was not performed with this format.   Location of patient: home Location of provider: [facility] Others present for call: none  1. RLE swelling/ pain Patient reports that she is able to ambulate but when she goes to sit or get into the bed the LE is hurting.  She has not missed any doses of her Eliquis. Her family member thought her LE looked more swollen.  Pt has not appreciated this but is having pain.  She reports some knee pain on the side and this runs down the back of her calf.  No reports of instability of the leg but pain is worse after not moving for a while.  She did not have any measuring tape to measure her calf size today  ROS: Per HPI  Allergies  Allergen Reactions   Avelox [Moxifloxacin Hcl In Nacl] Shortness Of Breath    Shortness of breath   Clindamycin/Lincomycin Shortness Of Breath and Diarrhea    Breathing/diarrhea     Kiwi Extract Anaphylaxis    Makes pt. Feel like her throat is closing    Bactrim [Sulfamethoxazole-Trimethoprim] Swelling   Levofloxacin Other (See Comments)    JOINT PAIN JOINT PAIN   Simvastatin Other (See Comments)    Joint pain   Spironolactone Other (See Comments)    burning   Avocado    Tramadol Nausea Only   Past Medical History:  Diagnosis Date   AICD (automatic cardioverter/defibrillator) present 09   [employer]- replaced 2016   Arthritis    Bradycardia    Breast cancer ([place])  02/24/2016   s/p lumpectomy and radiation but refused chemotherapy   Chronic systolic CHF (congestive heart failure) (HCC)    Cough due to angiotensin-converting enzyme inhibitor    Dizziness    Dizziness with standing after squatting, March, 2012   Dyslipidemia    LDL elevation,Patient does not want statin   Family history of breast cancer    GERD (gastroesophageal reflux disease)    H/O shortness of breath    CPX 10/18:  No evidence of cardiopulmonary limitation. Suspect exercise intolerance related to weight and deconditioning.    Hypothyroidism    ICD (implantable cardiac defibrillator) battery depletion    Dr. [name], June, 2009(artifactual atrial tachycardia response events addressed by reprogramming in parentheses   Leg swelling    right leg   Mitral regurgitation    Nonischemic cardiomyopathy ([place])    Etiology unknown, diagnosed 2008   Personal history of radiation therapy 2017   Pneumonia due to COVID-19 virus 10/2019   TIA (transient ischemic attack)    No CT or MRI abnormality, aspirin therapy    Current Outpatient Medications:    acetaminophen (TYLENOL) 500 MG tablet, Take 500 mg by mouth every 6 (six) hours as needed for mild pain or moderate pain., Disp: , Rfl:    acetaminophen-codeine (TYLENOL #3) 300-30 MG tablet, Take by mouth every 4 (four) hours as needed., Disp: , Rfl:    albuterol (VENTOLIN HFA) 108 (90 Base)  MCG/ACT inhaler, Inhale 2 puffs into the lungs every 6 (six) hours as needed for wheezing or shortness of breath., Disp: 8 g, Rfl: 2   apixaban (ELIQUIS) 5 MG TABS tablet, Take 1 tablet (5 mg total) by mouth 2 (two) times daily., Disp: 180 tablet, Rfl: 2   carvedilol (COREG) 3.125 MG tablet, Take 1 tablet (3.125 mg total) by mouth 2 (two) times daily., Disp: 180 tablet, Rfl: 1   dicyclomine (BENTYL) 10 MG capsule, Take 10 mg by mouth 3 (three) times daily before meals. , Disp: , Rfl:    eplerenone (INSPRA) 25 MG tablet, Take 1 tablet (25 mg total) by mouth  daily. PLEASE CALL OFFICE TO ARRANGE FOLLOW UP APPOINTMENT, Disp: 30 tablet, Rfl: 6   levothyroxine (SYNTHROID) 75 MCG tablet, Take 1 tablet (75 mcg total) by mouth daily before breakfast., Disp: 30 tablet, Rfl: 12   omeprazole (PRILOSEC) 40 MG capsule, Take 40 mg by mouth daily., Disp: , Rfl:    rosuvastatin (CRESTOR) 5 MG tablet, Take 1 tablet (5 mg total) by mouth daily., Disp: 30 tablet, Rfl: 11   sacubitril-valsartan (ENTRESTO) 97-103 MG, Take 1 tablet by mouth 2 (two) times daily., Disp: 60 tablet, Rfl: 6  General: Nontoxic appearing female in no acute distress  Assessment/ Plan: 61 y.o. female   Acute leg pain, right - Plan: US Venous Img Lower Unilateral Right  History of DVT of lower extremity - Plan: US Venous Img Lower Unilateral Right  Stat venous ultrasound of the right lower extremity ordered.  We discussed potential etiologies including DVT through her current DOAC versus Baker's cyst that may have ruptured.  Will adjust plan pending the results of this imaging study  Start time: 11:53am End time: 12:00p  Total time spent on patient care (including video visit/ documentation): 7 minutes  [place], [place] [phone]

## 2022-06-13 NOTE — Telephone Encounter (Signed)
Put her on the after hours schedule and I will call ASAP. Video preferred if she can do that so I can look at the leg.  Also ask her to measure BOTH calves and write down measurements.  They should be measured at the [facility] part of the calf

## 2022-06-16 ENCOUNTER — Telehealth: Payer: Self-pay | Admitting: Hematology and Oncology

## 2022-06-16 DIAGNOSIS — M25561 Pain in right knee: Secondary | ICD-10-CM | POA: Diagnosis not present

## 2022-06-16 NOTE — Telephone Encounter (Signed)
Rescheduled appointment per provider PAL. Left voicemail. 

## 2022-06-18 ENCOUNTER — Encounter (HOSPITAL_COMMUNITY): Payer: Self-pay

## 2022-06-18 ENCOUNTER — Other Ambulatory Visit: Payer: Self-pay

## 2022-06-18 ENCOUNTER — Encounter: Payer: Self-pay | Admitting: Internal Medicine

## 2022-06-18 ENCOUNTER — Emergency Department (HOSPITAL_COMMUNITY)
Admission: EM | Admit: 2022-06-18 | Discharge: 2022-06-18 | Disposition: A | Discharge status: Home / Self Care | Payer: 59 | Attending: Emergency Medicine | Admitting: Emergency Medicine

## 2022-06-18 DIAGNOSIS — R002 Palpitations: Secondary | ICD-10-CM | POA: Diagnosis not present

## 2022-06-18 DIAGNOSIS — R42 Dizziness and giddiness: Secondary | ICD-10-CM | POA: Diagnosis not present

## 2022-06-18 DIAGNOSIS — Z743 Need for continuous supervision: Secondary | ICD-10-CM | POA: Diagnosis not present

## 2022-06-18 DIAGNOSIS — I959 Hypotension, unspecified: Secondary | ICD-10-CM | POA: Diagnosis not present

## 2022-06-18 DIAGNOSIS — R000 Tachycardia, unspecified: Secondary | ICD-10-CM | POA: Diagnosis not present

## 2022-06-18 DIAGNOSIS — I471 Supraventricular tachycardia: Secondary | ICD-10-CM | POA: Diagnosis not present

## 2022-06-18 LAB — CBC WITH DIFFERENTIAL/PLATELET
Abs Immature Granulocytes: 0.06 10*3/uL (ref 0.00–0.07)
Basophils Absolute: 0 10*3/uL (ref 0.0–0.1)
Basophils Relative: 0 %
Eosinophils Absolute: 0 10*3/uL (ref 0.0–0.5)
Eosinophils Relative: 0 %
HCT: 34.3 % — ABNORMAL LOW (ref 36.0–46.0)
Hemoglobin: 11.4 g/dL — ABNORMAL LOW (ref 12.0–15.0)
Immature Granulocytes: 1 %
Lymphocytes Relative: 6 %
Lymphs Abs: 0.7 10*3/uL (ref 0.7–4.0)
MCH: 31.1 pg (ref 26.0–34.0)
MCHC: 33.2 g/dL (ref 30.0–36.0)
MCV: 93.7 fL (ref 80.0–100.0)
Monocytes Absolute: 0.8 10*3/uL (ref 0.1–1.0)
Monocytes Relative: 7 %
Neutro Abs: 10.3 10*3/uL — ABNORMAL HIGH (ref 1.7–7.7)
Neutrophils Relative %: 86 %
Platelets: 226 10*3/uL (ref 150–400)
RBC: 3.66 MIL/uL — ABNORMAL LOW (ref 3.87–5.11)
RDW: 13 % (ref 11.5–15.5)
WBC: 11.9 10*3/uL — ABNORMAL HIGH (ref 4.0–10.5)
nRBC: 0 % (ref 0.0–0.2)

## 2022-06-18 LAB — BASIC METABOLIC PANEL
Anion gap: 13 (ref 5–15)
BUN: 16 mg/dL (ref 8–23)
CO2: 18 mmol/L — ABNORMAL LOW (ref 22–32)
Calcium: 9.5 mg/dL (ref 8.9–10.3)
Chloride: 110 mmol/L (ref 98–111)
Creatinine, Ser: 0.99 mg/dL (ref 0.44–1.00)
GFR, Estimated: >60 mL/min (ref >60)
Glucose, Bld: 105 mg/dL — ABNORMAL HIGH (ref 70–99)
Potassium: 3.4 mmol/L — ABNORMAL LOW (ref 3.5–5.1)
Sodium: 141 mmol/L (ref 135–145)

## 2022-06-18 LAB — T4, FREE: Free T4: 0.93 ng/dL (ref 0.61–1.12)

## 2022-06-18 LAB — TSH: TSH: 0.154 u[IU]/mL — ABNORMAL LOW (ref 0.350–4.500)

## 2022-06-18 LAB — MAGNESIUM: Magnesium: 1.9 mg/dL (ref 1.7–2.4)

## 2022-06-18 NOTE — ED Provider Notes (Signed)
[place] [facility] EMERGENCY DEPARTMENT Provider Note   CSN: [id number] Arrival date & time: 06/18/22  0505     History  Chief Complaint  Patient presents with   Dizziness    [name] [name] is a 61 y.o. female.  61 yo F with a chief complaint of palpitations.  The patient got up to take some medication and when she got to her bed she felt like her heart was racing.  She called 911.  On their initial arrival her heart rate was noted to be in the 150s by the time they got on the monitor it improved.  She is now asymptomatic.  Denies any chest pain or difficulty breathing.  She is currently being treated for   Dizziness      Home Medications Prior to Admission medications   Medication Sig Start Date End Date Taking? Authorizing Provider  acetaminophen (TYLENOL) 500 MG tablet Take 500 mg by mouth every 6 (six) hours as needed for mild pain or moderate pain.    [provider]  acetaminophen-codeine (TYLENOL #3) 300-30 MG tablet Take by mouth every 4 (four) hours as needed.    [provider]  albuterol (VENTOLIN HFA) 108 (90 Base) MCG/ACT inhaler Inhale 2 puffs into the lungs every 6 (six) hours as needed for wheezing or shortness of breath. 05/17/22   Janora Norlander, DO  apixaban (ELIQUIS) 5 MG TABS tablet Take 1 tablet (5 mg total) by mouth 2 (two) times daily. 05/06/22   [place], Maricela Bo, FNP  carvedilol (COREG) 3.125 MG tablet Take 1 tablet (3.125 mg total) by mouth 2 (two) times daily. 05/06/22   [place], Maricela Bo, FNP  dicyclomine (BENTYL) 10 MG capsule Take 10 mg by mouth 3 (three) times daily before meals.     [provider]  eplerenone (INSPRA) 25 MG tablet Take 1 tablet (25 mg total) by mouth daily. PLEASE CALL OFFICE TO ARRANGE FOLLOW UP APPOINTMENT 05/06/22   Rafael Bihari, FNP  levothyroxine (SYNTHROID) 75 MCG tablet Take 1 tablet (75 mcg total) by mouth daily before breakfast. 05/17/22   Ronnie Doss M, DO  omeprazole  (PRILOSEC) 40 MG capsule Take 40 mg by mouth daily.    [provider]  rosuvastatin (CRESTOR) 5 MG tablet Take 1 tablet (5 mg total) by mouth daily. 03/22/22   Freada Bergeron, MD  sacubitril-valsartan (ENTRESTO) 97-103 MG Take 1 tablet by mouth 2 (two) times daily. 05/06/22   Rafael Bihari, FNP      Allergies    Avelox [moxifloxacin hcl in nacl], Clindamycin/lincomycin, Kiwi extract, Bactrim [sulfamethoxazole-trimethoprim], Levofloxacin, Simvastatin, Spironolactone, and Tramadol    Review of Systems   Review of Systems  Neurological:  Positive for dizziness.    Physical Exam Updated Vital Signs BP 104/68   Pulse 67   Temp (!) 97.1 F (36.2 C)   Resp 18   Ht '5\' 8"'$  (1.727 m)   Wt 93.9 kg   SpO2 95%   BMI 31.47 kg/m  Physical Exam Vitals and nursing note reviewed.  Constitutional:      General: She is not in acute distress.    Appearance: She is well-developed. She is not diaphoretic.  HENT:     Head: Normocephalic and atraumatic.  Eyes:     Pupils: Pupils are equal, round, and reactive to light.  Cardiovascular:     Rate and Rhythm: Normal rate and regular rhythm.     Heart sounds: No murmur heard.  No friction rub. No gallop.  Pulmonary:     Effort: Pulmonary effort is normal.     Breath sounds: No wheezing or rales.  Abdominal:     General: There is no distension.     Palpations: Abdomen is soft.     Tenderness: There is no abdominal tenderness.  Musculoskeletal:        General: No tenderness.     Cervical back: Normal range of motion and neck supple.  Skin:    General: Skin is warm and dry.  Neurological:     Mental Status: She is alert and oriented to person, place, and time.  Psychiatric:        Behavior: Behavior normal.     ED Results / Procedures / Treatments   Labs (all labs ordered are listed, but only abnormal results are displayed) Labs Reviewed  CBC WITH DIFFERENTIAL/PLATELET - Abnormal; Notable for the following components:       Result Value   WBC 11.9 (*)    RBC 3.66 (*)    Hemoglobin 11.4 (*)    HCT 34.3 (*)    Neutro Abs 10.3 (*)    All other components within normal limits  BASIC METABOLIC PANEL - Abnormal; Notable for the following components:   Potassium 3.4 (*)    CO2 18 (*)    Glucose, Bld 105 (*)    All other components within normal limits  TSH - Abnormal; Notable for the following components:   TSH 0.154 (*)    All other components within normal limits  MAGNESIUM  T4, FREE    EKG EKG Interpretation  Date/Time:  Saturday June 18 2022 05:12:21 EDT Ventricular Rate:  78 PR Interval:  161 QRS Duration: 96 QT Interval:  357 QTC Calculation: 407 R Axis:   9 Text Interpretation: Sinus rhythm Low voltage, precordial leads No significant change since last tracing Confirmed by Deno Etienne [phone]) on 06/18/2022 6:51:46 AM  Radiology No results found.  Procedures .1-3 Lead EKG Interpretation  Performed by: Deno Etienne, DO Authorized by: Deno Etienne, DO     Interpretation: normal     ECG rate:  80   ECG rate assessment: normal     Rhythm: sinus rhythm     Ectopy: none     Conduction: normal       Medications Ordered in ED Medications - No data to display  ED Course/ Medical Decision Making/ A&P                           Medical Decision Making Amount and/or Complexity of Data Reviewed Labs: ordered.   54 y oF with a chief complaint of palpitations.  This was short-lived this evening.  Heart rate 150s initially with EMS but quickly went back to normal sinus rhythm.  No ectopy here on the monitor.  No concerning electrolyte abnormality no significant anemia.  Patient's thyroid level is slightly high, seems unlikely to be the cause.  We will send free T4 in case the PCP wants to change her thyroid medication.  7:20 AM:  I have discussed the diagnosis/risks/treatment options with the patient.  Evaluation and diagnostic testing in the emergency department does not suggest an emergent  condition requiring admission or immediate intervention beyond what has been performed at this time.  They will follow up with  PCP. We also discussed returning to the ED immediately if new or worsening sx occur. We discussed the sx which are most  concerning (e.g., sudden worsening pain, fever, inability to tolerate by mouth) that necessitate immediate return. Medications administered to the patient during their visit and any new prescriptions provided to the patient are listed below.  Medications given during this visit Medications - No data to display   The patient appears reasonably screen and/or stabilized for discharge and I doubt any other medical condition or other [facility] requiring further screening, evaluation, or treatment in the ED at this time prior to discharge.          Final Clinical Impression(s) / ED Diagnoses Final diagnoses:  Palpitations    Rx / DC Orders ED Discharge Orders     None         Deno Etienne, DO 06/18/22 0720

## 2022-06-18 NOTE — Discharge Instructions (Signed)
There was no obvious abnormality with your blood work.  Please follow-up with your family doctor in the office.  Please return for worsening or persistent symptoms.

## 2022-06-18 NOTE — ED Triage Notes (Signed)
Pt from home BIB [facility] cc palpitations, upon waking up and felt dizzy when she got up walking down hall. EMS reports initial HR 150s, pt has internal defibrillator that did not fire. GCS 15, VSS on arrival, HR 79. Pt A&O x4.

## 2022-06-18 NOTE — ED Notes (Signed)
Pt resting and playing on her phone, NAD noted, observed even RR and unlabored, side rails up x2 for safety, plan of care ongoing, pt expresses no needs or concerns at this time, call light within reach, no further concerns as of present.

## 2022-06-20 DIAGNOSIS — I480 Paroxysmal atrial fibrillation: Secondary | ICD-10-CM | POA: Insufficient documentation

## 2022-06-20 HISTORY — DX: Paroxysmal atrial fibrillation: I48.0

## 2022-06-20 NOTE — Telephone Encounter (Signed)
Spoke with patient informed her that the night she went to the emergency room, she had been in atrial fibrillation two hours and 42mn  with average V rate of 123bpm and V rate max of 190bpm, informed patient that with atrial fibrillation the biggest cause for concern is a stroke, patient is already on Elqiuis '5mg'$  BID for DVT/PE. Informed patient that I would forward this information to her following providers as FJuluis Rainier

## 2022-06-21 NOTE — Telephone Encounter (Signed)
Does pt need to be seen before Nov 2023.

## 2022-06-21 NOTE — Telephone Encounter (Signed)
Called pt asked if would like to see an APP as Dr. [name] does not have any openings until October.  Pt does not want to see an APP scheduled for Oct 16 at 8 am.  Pt voiced no further questions or concerns.

## 2022-06-26 ENCOUNTER — Encounter: Payer: Self-pay | Admitting: Family Medicine

## 2022-06-28 ENCOUNTER — Other Ambulatory Visit: Payer: Self-pay | Admitting: Family Medicine

## 2022-06-28 DIAGNOSIS — E890 Postprocedural hypothyroidism: Secondary | ICD-10-CM

## 2022-07-01 DIAGNOSIS — Z6831 Body mass index (BMI) 31.0-31.9, adult: Secondary | ICD-10-CM | POA: Diagnosis not present

## 2022-07-01 DIAGNOSIS — Z01419 Encounter for gynecological examination (general) (routine) without abnormal findings: Secondary | ICD-10-CM | POA: Diagnosis not present

## 2022-07-08 ENCOUNTER — Ambulatory Visit: Payer: 59 | Admitting: Hematology and Oncology

## 2022-07-16 NOTE — Progress Notes (Signed)
Patient Care Team: Janora Norlander, DO as PCP - General (Family Medicine) [name], MD as PCP - Electrophysiology (Cardiology) Dorothy Spark, MD as PCP - Cardiology (Cardiology) Nicholas Lose, MD as Consulting Physician (Hematology and Oncology) Kyung Rudd, MD as Consulting Physician (Radiation Oncology) [name], MD as Consulting Physician (General Surgery)  DIAGNOSIS: No diagnosis found.  SUMMARY OF ONCOLOGIC HISTORY: Oncology History Overview Note      Breast cancer of upper-inner quadrant of left female breast ([place])  02/24/2016 Mammogram   Left breast mass 11:00 position 4 cm from nipple, 2.4 x 1.6 x 1.9 cm, single axillary lymph node noted, T2 N0 stage IIA clinical stage ([place])   02/24/2016 Initial Diagnosis   Moderate to poorly differentiated IDC, lymph node biopsy negative, ER 0%, PR 0%, HER-2, 2+ by IHC, fish negative, ratio 1.2, Ki-67 59%    04/05/2016 Surgery   Left lumpectomy [name]): IDC grade 3, 2.3 cm, associated high-grade DCIS, margins negative, 0/1 lymph node, T2 N0 stage II a, ER 0%, PR 0%, HER-2 negative ratio 1.38    Chemotherapy   Patient refused adjuvant chemotherapy   05/03/2016 - 06/16/2016 Radiation Therapy   Adjuvant radiation [name]). Left breast boost // 10 Gy with 5 fractions at a dose of 2 Gy/fraction.  Left breast// 50.4 Gy with 28 fractions at a dose of 1.8 Gy/ fraction   10/28/2016 Genetic Testing   Patient has genetic testing done for personal and family history of breast cancer. ATM [id number] VUS found on genetic testing.  The Breast/GYN gene panel offered by [facility] includes sequencing and rearrangement analysis for the following 23 genes:  ATM, BARD1, BRCA1, BRCA2, BRIP1, CDH1, CHEK2, EPCAM, FANCC, MLH1, MSH2, MSH6, MUTYH, NBN, NF1, PALB2, PMS2, POLD1, PTEN, RAD51C, RAD51D, RECQL, and TP53.   Negative genetic testing for the MSH2 inversion analysis ([name]).      CHIEF COMPLIANT:   INTERVAL HISTORY: [name] [name]  is a   ALLERGIES:  is allergic to avelox [moxifloxacin hcl in nacl], clindamycin/lincomycin, kiwi extract, bactrim [sulfamethoxazole-trimethoprim], levofloxacin, simvastatin, spironolactone, and tramadol.  MEDICATIONS:  Current Outpatient Medications  Medication Sig Dispense Refill   acetaminophen (TYLENOL) 500 MG tablet Take 500 mg by mouth every 6 (six) hours as needed for mild pain or moderate pain.     acetaminophen-codeine (TYLENOL #3) 300-30 MG tablet Take by mouth every 4 (four) hours as needed.     albuterol (VENTOLIN HFA) 108 (90 Base) MCG/ACT inhaler Inhale 2 puffs into the lungs every 6 (six) hours as needed for wheezing or shortness of breath. 8 g 2   apixaban (ELIQUIS) 5 MG TABS tablet Take 1 tablet (5 mg total) by mouth 2 (two) times daily. 180 tablet 2   carvedilol (COREG) 3.125 MG tablet Take 1 tablet (3.125 mg total) by mouth 2 (two) times daily. 180 tablet 1   dicyclomine (BENTYL) 10 MG capsule Take 10 mg by mouth 3 (three) times daily before meals.      eplerenone (INSPRA) 25 MG tablet Take 1 tablet (25 mg total) by mouth daily. PLEASE CALL OFFICE TO ARRANGE FOLLOW UP APPOINTMENT 30 tablet 6   levothyroxine (SYNTHROID) 75 MCG tablet Take 1 tablet (75 mcg total) by mouth daily before breakfast. 30 tablet 12   omeprazole (PRILOSEC) 40 MG capsule Take 40 mg by mouth daily.     rosuvastatin (CRESTOR) 5 MG tablet Take 1 tablet (5 mg total) by mouth daily. 30 tablet 11   sacubitril-valsartan (ENTRESTO) 97-103 MG Take 1  tablet by mouth 2 (two) times daily. 60 tablet 6   No current facility-administered medications for this visit.    PHYSICAL EXAMINATION: ECOG PERFORMANCE STATUS: {CHL ONC ECOG PS:[id number]}  There were no vitals filed for this visit. There were no vitals filed for this visit.  BREAST:*** No palpable masses or nodules in either right or left breasts. No palpable axillary supraclavicular or infraclavicular adenopathy no breast tenderness or nipple discharge.  (exam performed in the presence of a chaperone)  LABORATORY DATA:  I have reviewed the data as listed    Latest Ref Rng & Units 06/18/2022    5:17 AM 04/19/2022   12:36 PM 04/13/2022    4:35 AM  CMP  Glucose 70 - 99 mg/dL 105  83  93   BUN 8 - 23 mg/dL '16  11  14   ' Creatinine 0.44 - 1.00 mg/dL 0.99  0.79  0.84   Sodium 135 - 145 mmol/L 141  142  141   Potassium 3.5 - 5.1 mmol/L 3.4  4.6  3.7   Chloride 98 - 111 mmol/L 110  104  110   CO2 22 - 32 mmol/L '18  25  27   ' Calcium 8.9 - 10.3 mg/dL 9.5  9.6  9.0   Total Protein 6.0 - 8.5 g/dL  6.7  6.3   Total Bilirubin 0.0 - 1.2 mg/dL  <0.2  0.4   Alkaline Phos 44 - 121 IU/L  57  42   AST 0 - 40 IU/L  14  12   ALT 0 - 32 IU/L  10  10     Lab Results  Component Value Date   WBC 11.9 (H) 06/18/2022   HGB 11.4 (L) 06/18/2022   HCT 34.3 (L) 06/18/2022   MCV 93.7 06/18/2022   PLT 226 06/18/2022   NEUTROABS 10.3 (H) 06/18/2022    ASSESSMENT & PLAN:  No problem-specific Assessment & Plan notes found for this encounter.    No orders of the defined types were placed in this encounter.  The patient has a good understanding of the overall plan. she agrees with it. she will call with any problems that may develop before the next visit here. Total time spent: 30 mins including face to face time and time spent for planning, charting and co-ordination of care   Suzzette Righter, [place] 07/16/22    I Gardiner Coins am scribing for Dr. Lindi Adie  ***

## 2022-07-19 ENCOUNTER — Other Ambulatory Visit: Payer: Self-pay

## 2022-07-19 ENCOUNTER — Inpatient Hospital Stay: Payer: 59 | Attending: Hematology and Oncology | Admitting: Hematology and Oncology

## 2022-07-19 DIAGNOSIS — Z171 Estrogen receptor negative status [ER-]: Secondary | ICD-10-CM

## 2022-07-19 DIAGNOSIS — Z923 Personal history of irradiation: Secondary | ICD-10-CM | POA: Insufficient documentation

## 2022-07-19 DIAGNOSIS — C50212 Malignant neoplasm of upper-inner quadrant of left female breast: Secondary | ICD-10-CM

## 2022-07-19 DIAGNOSIS — Z853 Personal history of malignant neoplasm of breast: Secondary | ICD-10-CM | POA: Insufficient documentation

## 2022-07-19 DIAGNOSIS — Z9221 Personal history of antineoplastic chemotherapy: Secondary | ICD-10-CM | POA: Diagnosis not present

## 2022-07-19 NOTE — Assessment & Plan Note (Addendum)
Left lumpectomy 04/05/2016: IDC grade 3, 2.3 cm, associated high-grade DCIS, margins negative, 0/1 lymph node, T2 N0 stage II a, ER 0%, PR 0%, HER-2 negative ratio 1.38 Patient refused adjuvant chemotherapy (patient fully understands that she is at very high risk of recurrence) Received adjuvant radiation from 05/03/2016 to 06/16/2016  Surveillance of breast cancer: 1. Breast exams9/26/2023: Benign 2. Annual mammograms7/19/2023 benign.  Breast density catB  Back Pain: seeing Ortho Scan: Disc issues L4-5 and arthritis  Hospitalization: 04/21/2020-04/22/2020: PE:  Recurrent DVT and PE 2023: Indefinite anticoagulation  Limitation of range of motion of the left hand: Unclear origin.  Did not do physical therapy because of cost concerns  Treatment plan: Lupus anticoagulant: Negative on 06/30/2020 Indefinite anticoagulation because of recurrent blood clots  Return to clinic on an as needed basis

## 2022-07-21 NOTE — Progress Notes (Unsigned)
Cardiology Office Note:    Date:  07/21/2022   ID:  [name] [name], DOB 02/12/1961, MRN [id number]  PCP:  Janora Norlander [facility]  Cardiologist:  Ena Dawley, MD  Advanced Practice Provider:  No care team member to display Electrophysiologist:  [name], MD   Referring MD: Janora Norlander, DO    History of Present Illness:    [name] [name] is a 61 y.o. female with a hx of breast cancer (s/p lumpectomy and XRT - refused chemo), hypothyroidism, HLD, TIA, GERD and systolic HF due to NICM with LVEF ~30% s/p AICD placement who was previously followed by Dr. [name] now returning to clinic for follow-up.  She has long h/o NICM dating back to 2007. Cath in 2017 with normal cors and EF 25-30%. S/p s-ICD. Medical therapy has been limited by multiple medication intolerances (Spironolactone caused body burning sensation, could not afford Bidil and had fatigue with high does of Entresto). Has been followed Dr. [name] in HF clinic. Underwent CPX which revealed normal functional capacity. Was doing well on last visit with Dr. [name] on 10/14/20.  TTE 04/22 EF 42-35%, RV systolic fxn moderately reduced. Had lost 15lbs at that time.   Admitted 6/23 with chest pain and SOB. CT chest showed small RLL PE. Started on Eliquis. Echo showed EF 35-40%, RV OK.   Was last seen in clinic by [facility] on 05/06/22 where she had SOB with ADLs and walking. No chest pain, orthopnea, or LE edema. Compliant with meds. No changes in meds at that time.  Today, ***  Past Medical History:  Diagnosis Date   AICD (automatic cardioverter/defibrillator) present 09   [employer]- replaced 2016   Arthritis    Bradycardia    Breast cancer ([place]) 02/24/2016   s/p lumpectomy and radiation but refused chemotherapy   Chronic systolic CHF (congestive heart failure) (HCC)    Cough due to angiotensin-converting enzyme inhibitor    Dizziness    Dizziness with standing  after squatting, March, 2012   Dyslipidemia    LDL elevation,Patient does not want statin   Family history of breast cancer    GERD (gastroesophageal reflux disease)    H/O shortness of breath    CPX 10/18:  No evidence of cardiopulmonary limitation. Suspect exercise intolerance related to weight and deconditioning.    Hypothyroidism    ICD (implantable cardiac defibrillator) battery depletion    Dr. [name], June, 2009(artifactual atrial tachycardia response events addressed by reprogramming in parentheses   Leg swelling    right leg   Mitral regurgitation    Nonischemic cardiomyopathy ([place])    Etiology unknown, diagnosed 2008   Paroxysmal atrial fibrillation ([place]) 06/20/2022   Personal history of radiation therapy 2017   Pneumonia due to COVID-19 virus 10/2019   TIA (transient ischemic attack)    No CT or MRI abnormality, aspirin therapy    Past Surgical History:  Procedure Laterality Date   BREAST BIOPSY Left 02/24/2016   U/S Core   BREAST BIOPSY Left 02/24/2016   U/S Core   BREAST LUMPECTOMY Left 04/05/2016   invasive ductal    BREAST LUMPECTOMY WITH RADIOACTIVE SEED AND SENTINEL LYMPH NODE BIOPSY Left 04/05/2016   Procedure: BREAST LUMPECTOMY WITH RADIOACTIVE SEED AND SENTINEL LYMPH NODE BIOPSY;  Surgeon: [name], MD;  Location: [place];  Service: General;  Laterality: Left;   CARDIAC CATHETERIZATION N/A 08/05/2016   Procedure: Left Heart Cath and Coronary Angiography;  Surgeon: [name]  M Martinique, MD;  Location: [place] [facility];  Service: Cardiovascular;  Laterality: N/A;   CARDIAC DEFIBRILLATOR PLACEMENT  09   [employer] no remote   COLONOSCOPY     CYST EXCISION Left    back of leg-lt    IMPLANTABLE CARDIOVERTER DEFIBRILLATOR GENERATOR CHANGE N/A 12/29/2014   Procedure: IMPLANTABLE CARDIOVERTER DEFIBRILLATOR GENERATOR CHANGE;  Surgeon: [name], MD;  Location: [facility] C[facility];  Service: Cardiovascular;  Laterality: N/A;   LATERAL EPICONDYLE RELEASE  10/11/2012    Procedure: TENNIS ELBOW RELEASE;  Surgeon: [name], MD;  Location: [place];  Service: Orthopedics;  Laterality: Right;  RIGHT ELBOW: TENOTOMY ELBOW LATERAL EPICONDYLITIS TENNIS ELBOW: RADIAL TUNNEL RELEASE   PORT-A-CATH REMOVAL  04/13/2016   Procedure: MINOR REMOVAL PORT-A-CATH;  Surgeon: [name], MD;  Location: [place];  Service: General;;   PORTACATH PLACEMENT Right 04/05/2016   Procedure: INSERTION PORT-A-CATH WITH [place];  Surgeon: [name], MD;  Location: [place];  Service: General;  Laterality: Right;  right IJ   TOTAL THYROIDECTOMY  2001   TUBAL LIGATION  94   UPPER GI ENDOSCOPY      Current Medications: No outpatient medications have been marked as taking for the 07/27/22 encounter (Appointment) with Freada Bergeron, MD.     Allergies:   Avelox [moxifloxacin hcl in nacl], Clindamycin/lincomycin, Kiwi extract, Bactrim [sulfamethoxazole-trimethoprim], Levofloxacin, Simvastatin, Spironolactone, Moxifloxacin hcl, and Tramadol   Social History   Socioeconomic History   Marital status: Widowed    Spouse name: Not on file   Number of children: Not on file   Years of education: Not on file   Highest education level: Not on file  Occupational History   Occupation: Widow  Tobacco Use   Smoking status: Never   Smokeless tobacco: Never  Vaping Use   Vaping Use: Never used  Substance and Sexual Activity   Alcohol use: No   Drug use: No   Sexual activity: Not on file  Other Topics Concern   Not on file  Social History Narrative   Not on file   Social Determinants of Health   Financial Resource Strain: Medium Risk (12/11/2019)   Overall Financial Resource Strain (CARDIA)    Difficulty of Paying Living Expenses: Somewhat hard  Food Insecurity: Not on file  Transportation Needs: No Transportation Needs (12/11/2019)   PRAPARE - Hydrologist (Medical): No    Lack of Transportation (Non-Medical): No   Physical Activity: Insufficiently Active (12/11/2019)   Exercise Vital Sign    Days of Exercise per Week: 1 day    Minutes of Exercise per Session: 10 min  Stress: Not on file  Social Connections: Not on file     Family History: The patient's family history includes Breast cancer in her maternal aunt; Breast cancer (age of onset: 108) in her mother; Cancer in her maternal aunt, mother, and paternal grandmother; Heart attack in her mother; Hypertension in her brother and sister. There is no history of Stroke or Diabetes.  ROS:   Please see the history of present illness. (+) Cramps (+) Back Pain (+) Stress/anxiety (+) Depression (+) Lightheadedness (+) Dizziness All other systems are reviewed and negative.    EKGs/Labs/Other Studies Reviewed:    The following studies were reviewed today:  TTE 01/2021: IMPRESSIONS   1. Left ventricular ejection fraction, by estimation, is 30 to 35%. The  left ventricle has moderately decreased function. The left ventricle has  no regional  wall motion abnormalities. The left ventricular internal  cavity size was mildly dilated. Left  ventricular diastolic parameters are consistent with Grade I diastolic  dysfunction (impaired relaxation).   2. Right ventricular systolic function is moderately reduced. The right  ventricular size is normal. There is normal pulmonary artery systolic  pressure.   3. Left atrial size was mildly dilated.   4. The mitral valve is normal in structure. Trivial mitral valve  regurgitation. No evidence of mitral stenosis.   5. The aortic valve is normal in structure. Aortic valve regurgitation is  not visualized. No aortic stenosis is present.   6. The inferior vena cava is normal in size with greater than 50%  respiratory variability, suggesting right atrial pressure of 3 mmHg.   TTE 04/22/20:  1. Left ventricular ejection fraction, by estimation, is 35 to 40%. The  left ventricle has normal function. The left  ventricle has no regional  wall motion abnormalities. Left ventricular diastolic parameters are  indeterminate.   2. Right ventricular systolic function is normal. The right ventricular  size is normal.   3. The mitral valve is normal in structure. No evidence of mitral valve  regurgitation. No evidence of mitral stenosis.   4. The aortic valve is tricuspid. Aortic valve regurgitation is not  visualized. No aortic stenosis is present.   5. The inferior vena cava is normal in size with greater than 50%  respiratory variability, suggesting right atrial pressure of 3 mmHg.   CPX 12/19/19: Exercise Time:    11:00   Speed (mph): 3.0       Grade (%): 7.5     RPE: 17   Reason stopped: dyspnea (7/10)   Additional symptoms: None reported   Resting HR: 85 Standing HR: 86 Peak HR: 161   (100% age predicted max HR)   BP rest: 128/84 Standing BP: 116/82 BP peak: 166/72   Peak VO2: 17.7 (97%% predicted peak VO2)   VE/VCO2 slope:  29   OUES: 2.02   Peak RER: 1.08   Ventilatory Threshold: 13.0 (71% predicted or measured peak VO2)   Peak RR 38   Peak Ventilation:  56.8   VE/MVV:  54%   PETCO2 at peak:  38   O2pulse:  11   (100% predicted O2pulse)    Interpretation   Notes: Patient gave a very good effort. Pulse-oximetry remained 97-99% for the duration of exercise.   ECG:  Resting ECG in sinus rhythm with occasional PVCs. HR response appropriate. There were frequent PVCs during exercise with no sustained arrhythmias or ST-T changes.   PFT:  Pre-exercise spirometry was within normal limits. The MVV was normal.   CPX:  Exercise testing with gas exchange demonstrates a normal peak VO2 of 17.7 ml/kg/min (97% of the age/gender/weight matched sedentary norms). The RER of 1.08 indicates a near maximal effort. When adjusted to the patient's ideal body weight of 153.1 lb (69.5 kg) the peak VO2 is 24.7 ml/kg (ibw)/min (108% of the ibw-adjusted predicted). The VE/VCO2 slope is normal. The  oxygen uptake efficiency slope (OUES) is normal. The VO2 at the ventilatory threshold was normal at 71% of the predicted peak VO2. At peak exercise, the ventilation reached 54% of the measured MVV indicating ventilatory reserve remained. The O2pulse (a surrogate for stroke volume) increased with incremental exercise, reaching peak 11 ml/beat (100% predicted).    Conclusion: Exercise testing with gas exchange demonstrates normal functional capacity when compared to matched sedentary norms. There is no clear cardiopulmonary limitation. With  corrections for ideal body weight, patient's body habitus appears the limiting factor. Overall CPX essentially unchanged from 2018.  Cath 2017: There is severe left ventricular systolic dysfunction. LV end diastolic pressure is normal. The left ventricular ejection fraction is 25-35% by visual estimate.   1. Normal coronary anatomy 2. Severe LV dysfunction 3. Elevated LV EDP   Plan: medical management.     EKG:  03/01/2022 Rate 58 Sinus Rhythm   02/04/2021 EKG: ekg demonstrates NSR with HR 72  Recent Labs: 04/19/2022: ALT 10 06/18/2022: BUN 16; Creatinine, Ser 0.99; Hemoglobin 11.4; Magnesium 1.9; Platelets 226; Potassium 3.4; Sodium 141; TSH 0.154  Recent Lipid Panel    Component Value Date/Time   CHOL 186 03/07/2022 0940   TRIG 54 03/07/2022 0940   HDL 88 03/07/2022 0940   CHOLHDL 2.1 03/07/2022 0940   LDLCALC 88 03/07/2022 0940     Physical Exam:    VS:  There were no vitals taken for this visit.    Wt Readings from Last 3 Encounters:  07/19/22 211 lb 9.6 oz (96 kg)  06/18/22 207 lb (93.9 kg)  05/17/22 204 lb 12.8 oz (92.9 kg)     GEN:  Well nourished, well developed in no acute distress HEENT: Normal NECK: No JVD; No carotid bruits CARDIAC: RRR, no murmurs, rubs, gallops RESPIRATORY:  Clear to auscultation without rales, wheezing or rhonchi  ABDOMEN: Soft, non-tender, non-distended MUSCULOSKELETAL:  No edema; No deformity  SKIN:  Warm and dry NEUROLOGIC:  Alert and oriented x 3 PSYCHIATRIC:  Normal affect   ASSESSMENT:    No diagnosis found.  PLAN:    In order of problems listed above:  #Palpitations:  Patient had episode where she had HR up to 160s for about 1 hour on 06/18/22. Was symptomatic at that time with lightheadedness, dizziness, palpitations, and chest tightness. Device did not fire. Called EMS and was [facility] to [facility].  Does not appear rhythm was captured by EMS. No device interrogation performed that I can see. Will interrogate device today and check 2 week zio (has [employer] ICD). Notably, she is currently on apixaban for PE. -Check 2 week zio -Interrogate device -Notably, she is on lifelong apixaban for recurrent PE -Continue coreg 3.'125mg'$  BID and can up-titrate as needed  #Chronic Systolic HF: #Non-ischemic CM: Longstanding NICM since 2007. Cath 2017 with normal coronaries; TTE 10/2019 with LVEF 25-30%, RV normal. TTE 03/2022 with 35-40%, G1DD, mild MR, normal RAP.  CPX with normal functional capacity. Currently with NYHA class II-III symptoms.  -Continue entresto 97/103 mg BID  -Continue coreg 3.'125mg'$  BID -Continue eplerenone '25mg'$  daily -Off laisx -No farxiga to given history of yeast infections -Did not tolerate spiro and could not afford bidil -S/p ICD placement  #Recurrent PE: -On lifelong apixaban '5mg'$  BID  #HTN: Well controlled and at goal <130/90. -Continue coreg and entresto as above  #HLD: -Continue crestor '5mg'$  daily -LDL 88 (goal <100)  #Depression: -Will follow-up with PCP to discuss further as we discussed that treating depression is very important to overall health    Medication Adjustments/Labs and Tests Ordered: Current medicines are reviewed at length with the patient today.  Concerns regarding medicines are outlined above.  No orders of the defined types were placed in this encounter.  No orders of the defined types were placed in this encounter.   There are no  Patient Instructions on file for this visit.  F/U in 6 months as needed

## 2022-07-27 ENCOUNTER — Ambulatory Visit: Payer: 59 | Attending: Cardiology | Admitting: Cardiology

## 2022-07-27 ENCOUNTER — Ambulatory Visit: Payer: 59

## 2022-07-27 ENCOUNTER — Encounter: Payer: Self-pay | Admitting: Cardiology

## 2022-07-27 VITALS — BP 122/66 | HR 64 | Ht 68.0 in | Wt 210.6 lb

## 2022-07-27 DIAGNOSIS — E782 Mixed hyperlipidemia: Secondary | ICD-10-CM | POA: Diagnosis not present

## 2022-07-27 DIAGNOSIS — Z95810 Presence of automatic (implantable) cardiac defibrillator: Secondary | ICD-10-CM | POA: Diagnosis not present

## 2022-07-27 DIAGNOSIS — I428 Other cardiomyopathies: Secondary | ICD-10-CM

## 2022-07-27 DIAGNOSIS — I2699 Other pulmonary embolism without acute cor pulmonale: Secondary | ICD-10-CM

## 2022-07-27 DIAGNOSIS — I10 Essential (primary) hypertension: Secondary | ICD-10-CM

## 2022-07-27 DIAGNOSIS — I5022 Chronic systolic (congestive) heart failure: Secondary | ICD-10-CM

## 2022-07-27 DIAGNOSIS — I480 Paroxysmal atrial fibrillation: Secondary | ICD-10-CM | POA: Diagnosis not present

## 2022-07-27 DIAGNOSIS — R002 Palpitations: Secondary | ICD-10-CM | POA: Diagnosis not present

## 2022-07-27 LAB — BASIC METABOLIC PANEL
BUN/Creatinine Ratio: 11 — ABNORMAL LOW (ref 12–28)
BUN: 9 mg/dL (ref 8–27)
CO2: 25 mmol/L (ref 20–29)
Calcium: 8.9 mg/dL (ref 8.7–10.3)
Chloride: 108 mmol/L — ABNORMAL HIGH (ref 96–106)
Creatinine, Ser: 0.8 mg/dL (ref 0.57–1.00)
Glucose: 81 mg/dL (ref 70–99)
Potassium: 4.4 mmol/L (ref 3.5–5.2)
Sodium: 144 mmol/L (ref 134–144)
eGFR: 83 mL/min/{1.73_m2} (ref >59)

## 2022-07-27 NOTE — Progress Notes (Signed)
Cardiology Office Note:    Date:  07/27/2022   ID:  ILLONA [name], DOB 05-May-1961, MRN [id number]  PCP:  [name] [name]  Cardiologist:  Ena Dawley, MD  Advanced Practice Provider:  No care team member to display Electrophysiologist:  [name] Axe, MD   Referring MD: [name] Norlander, DO    History of Present Illness:    [name] [name] is a 61 y.o. female with a hx of breast cancer (s/p lumpectomy and XRT - refused chemo), hypothyroidism, HLD, TIA, GERD and systolic HF due to NICM with LVEF ~30% s/p AICD placement who was previously followed by Dr. Meda [name] now returning to clinic for follow-up.  She has long h/o NICM dating back to 2007. Cath in 2017 with normal cors and EF 25-30%. S/p s-ICD. Medical therapy has been limited by multiple medication intolerances (Spironolactone caused body burning sensation, could not afford Bidil and had fatigue with high does of Entresto). Has been followed Dr. Haroldine [name] in HF clinic. Underwent CPX which revealed normal functional capacity. Was doing well on last visit with Dr. Haroldine [name] on 10/14/20.  TTE 04/22 EF 93-90%, RV systolic fxn moderately reduced. Had lost 15lbs at that time.   Admitted 6/23 with chest pain and SOB. CT chest showed small RLL PE. Started on Eliquis. Echo showed EF 35-40%, RV OK.   Was last seen in clinic by [facility] on 05/06/22 where she had SOB with ADLs and walking. No chest pain, orthopnea, or LE edema. Compliant with meds. No changes in meds at that time.  Today, the patient states that she presented to the ED 06/18/2022 due to an episode of tachycardia in the 160s lasting for about an hour or two until EMS arrived. She was feeling dizzy, with a little bit of chest pressure. When they arrived her heart rate had lowered to the 150s, and continued to decrease on the way to the hospital. Her defibrillator was not interrogated and she states it did not fire at that time. Since  that time she had a couple episodes as high as 117 bpm shortly after her ED visit. There was also one recurring episode last week. She keeps a pulse oximeter at home to help monitor her heart rates.   Lately she continues to have shortness of breath with exertion. Additionally she complains of some chronic orthopnea, with onset since her prior COVID infection.  She will remain on anticoagulation permanently due to her recurrent blood clots. She had previously been on anticoagulation for about a year from when she had two pulmonary emboli in 2021.  She denies any peripheral edema, headaches, syncope, or PND.   Past Medical History:  Diagnosis Date   AICD (automatic cardioverter/defibrillator) present 09   [employer]- replaced 2016   Arthritis    Bradycardia    Breast cancer ([name]) 02/24/2016   s/p lumpectomy and radiation but refused chemotherapy   Chronic systolic CHF (congestive heart failure) (HCC)    Cough due to angiotensin-converting enzyme inhibitor    Dizziness    Dizziness with standing after squatting, March, 2012   Dyslipidemia    LDL elevation,Patient does not want statin   Family history of breast cancer    GERD (gastroesophageal reflux disease)    H/O shortness of breath    CPX 10/18:  No evidence of cardiopulmonary limitation. Suspect exercise intolerance related to weight and deconditioning.    Hypothyroidism    ICD (implantable cardiac defibrillator) battery  depletion    Dr. Caryl [name], June, 2009(artifactual atrial tachycardia response events addressed by reprogramming in parentheses   Leg swelling    right leg   Mitral regurgitation    Nonischemic cardiomyopathy ([place])    Etiology unknown, diagnosed 2008   Paroxysmal atrial fibrillation ([place]) 06/20/2022   Personal history of radiation therapy 2017   Pneumonia due to COVID-19 virus 10/2019   TIA (transient ischemic attack)    No CT or MRI abnormality, aspirin therapy    Past Surgical History:  Procedure  Laterality Date   BREAST BIOPSY Left 02/24/2016   U/S Core   BREAST BIOPSY Left 02/24/2016   U/S Core   BREAST LUMPECTOMY Left 04/05/2016   invasive ductal    BREAST LUMPECTOMY WITH RADIOACTIVE SEED AND SENTINEL LYMPH NODE BIOPSY Left 04/05/2016   Procedure: BREAST LUMPECTOMY WITH RADIOACTIVE SEED AND SENTINEL LYMPH NODE BIOPSY;  Surgeon: [name] Overall, MD;  Location: [place];  Service: General;  Laterality: Left;   CARDIAC CATHETERIZATION N/A 08/05/2016   Procedure: Left Heart Cath and Coronary Angiography;  Surgeon: [name] M Martinique, MD;  Location: [place] CV LAB;  Service: Cardiovascular;  Laterality: N/A;   CARDIAC DEFIBRILLATOR PLACEMENT  09   [employer] no remote   COLONOSCOPY     CYST EXCISION Left    back of leg-lt    IMPLANTABLE CARDIOVERTER DEFIBRILLATOR GENERATOR CHANGE N/A 12/29/2014   Procedure: IMPLANTABLE CARDIOVERTER DEFIBRILLATOR GENERATOR CHANGE;  Surgeon: [name] Sprang, MD;  Location: [facility] CATH LAB;  Service: Cardiovascular;  Laterality: N/A;   LATERAL EPICONDYLE RELEASE  10/11/2012   Procedure: TENNIS ELBOW RELEASE;  Surgeon: [name] Lights, MD;  Location: [place];  Service: Orthopedics;  Laterality: Right;  RIGHT ELBOW: TENOTOMY ELBOW LATERAL EPICONDYLITIS TENNIS ELBOW: RADIAL TUNNEL RELEASE   PORT-A-CATH REMOVAL  04/13/2016   Procedure: MINOR REMOVAL PORT-A-CATH;  Surgeon: [name] Mesa, MD;  Location: [place];  Service: General;;   PORTACATH PLACEMENT Right 04/05/2016   Procedure: INSERTION PORT-A-CATH WITH [place];  Surgeon: [name] Overall, MD;  Location: [place];  Service: General;  Laterality: Right;  right IJ   TOTAL THYROIDECTOMY  2001   TUBAL LIGATION  94   UPPER GI ENDOSCOPY      Current Medications: Current Meds  Medication Sig   acetaminophen (TYLENOL) 500 MG tablet Take 500 mg by mouth every 6 (six) hours as needed for mild pain or moderate pain.   acetaminophen-codeine (TYLENOL #3) 300-30 MG tablet Take by mouth every 4  (four) hours as needed.   albuterol (VENTOLIN HFA) 108 (90 Base) MCG/ACT inhaler Inhale 2 puffs into the lungs every 6 (six) hours as needed for wheezing or shortness of breath.   apixaban (ELIQUIS) 5 MG TABS tablet Take 1 tablet (5 mg total) by mouth 2 (two) times daily.   carvedilol (COREG) 3.125 MG tablet Take 1 tablet (3.125 mg total) by mouth 2 (two) times daily.   cetirizine (ZYRTEC) 10 MG tablet Take 1 tablet by mouth daily.   dicyclomine (BENTYL) 10 MG capsule Take 10 mg by mouth 3 (three) times daily before meals.    eplerenone (INSPRA) 25 MG tablet Take 1 tablet (25 mg total) by mouth daily. PLEASE CALL OFFICE TO ARRANGE FOLLOW UP APPOINTMENT   levothyroxine (SYNTHROID) 75 MCG tablet Take 1 tablet (75 mcg total) by mouth daily before breakfast.   omeprazole (PRILOSEC) 40 MG capsule Take 40 mg by mouth daily.   rosuvastatin (CRESTOR) 5 MG tablet Take 1 tablet (5 mg  total) by mouth daily.   sacubitril-valsartan (ENTRESTO) 97-103 MG Take 1 tablet by mouth 2 (two) times daily.     Allergies:   Avelox [moxifloxacin hcl in nacl], Clindamycin/lincomycin, Kiwi extract, Bactrim [sulfamethoxazole-trimethoprim], Levofloxacin, Simvastatin, Spironolactone, Moxifloxacin hcl, and Tramadol   Social History   Socioeconomic History   Marital status: Widowed    Spouse name: Not on file   Number of children: Not on file   Years of education: Not on file   Highest education level: Not on file  Occupational History   Occupation: Widow  Tobacco Use   Smoking status: Never   Smokeless tobacco: Never  Vaping Use   Vaping Use: Never used  Substance and Sexual Activity   Alcohol use: No   Drug use: No   Sexual activity: Not on file  Other Topics Concern   Not on file  Social History Narrative   Not on file   Social Determinants of Health   Financial Resource Strain: Medium Risk (12/11/2019)   [name] Financial Resource Strain (CARDIA)    Difficulty of Paying Living Expenses: Somewhat hard   Food Insecurity: Not on file  Transportation Needs: No Transportation Needs (12/11/2019)   PRAPARE - Hydrologist (Medical): No    Lack of Transportation (Non-Medical): No  Physical Activity: Insufficiently Active (12/11/2019)   Exercise Vital Sign    Days of Exercise per Week: 1 day    Minutes of Exercise per Session: 10 min  Stress: Not on file  Social Connections: Not on file     Family History: The patient's family history includes Breast cancer in her maternal aunt; Breast cancer (age of onset: 61) in her mother; Cancer in her maternal aunt, mother, and paternal grandmother; Heart attack in her mother; Hypertension in her brother and sister. There is no history of Stroke or Diabetes.  ROS:   Review of Systems  Constitutional:  Negative for chills and fever.  HENT:  Negative for nosebleeds and tinnitus.   Eyes:  Negative for blurred vision and pain.  Respiratory:  Positive for shortness of breath. Negative for cough, hemoptysis and stridor.   Cardiovascular:  Positive for palpitations and orthopnea. Negative for chest pain, claudication, leg swelling and PND.  Gastrointestinal:  Negative for blood in stool, diarrhea, nausea and vomiting.  Genitourinary:  Negative for dysuria and hematuria.  Musculoskeletal:  Negative for falls.  Neurological:  Negative for loss of consciousness and headaches.  Psychiatric/Behavioral:  Negative for depression, hallucinations and substance abuse. The patient does not have insomnia.       EKGs/Labs/Other Studies Reviewed:    The following studies were reviewed today:  Echo  04/20/2022:  1. Left ventricular ejection fraction, by estimation, is 35 to 40%. The  left ventricle has moderately decreased function. The left ventricle  demonstrates global hypokinesis. Left ventricular diastolic parameters are  consistent with Grade I diastolic  dysfunction (impaired relaxation).   2. The mitral valve is normal in structure.  Mild mitral valve  regurgitation.   3. The aortic valve is tricuspid. Aortic valve regurgitation is not  visualized.   4. The inferior vena cava is normal in size with greater than 50%  respiratory variability, suggesting right atrial pressure of 3 mmHg.   Comparison(s): No significant change from prior study. Prior images  reviewed side by side.   CTA Chest  04/12/2022: FINDINGS: Cardiovascular: Heart is enlarged in size. Thoracic aorta demonstrates atherosclerotic calcifications without aneurysmal dilatation or dissection. Pulmonary artery demonstrates a normal  branching pattern. There are a few filling defects identified within the right lower lobe pulmonary artery consistent with pulmonary emboli. No evidence of right heart strain is noted. No other filling defects are seen. Pacing device is noted.   Mediastinum/Nodes: Thoracic inlet is within normal limits. Changes of prior thyroid surgery are noted. No hilar or mediastinal adenopathy is seen. The esophagus as visualized is within normal limits.   Lungs/Pleura: Lungs are well aerated bilaterally. No focal infiltrate or sizable effusion is seen. No sizable parenchymal nodules are noted.   Upper Abdomen: Visualized upper abdomen is unremarkable.   Musculoskeletal: Degenerative changes of the thoracic spine are seen. No rib abnormality is noted.   Review of the MIP images confirms the above findings.   IMPRESSION: Small pulmonary emboli within the right lower lobe as described. No right heart strain is noted.   No other acute abnormality is noted.   Critical Value/emergent results were called by telephone at the time of interpretation on 04/12/2022 at 9:57 pm to [facility] , who verbally acknowledged these results.   Aortic Atherosclerosis (ICD10-I70.0).  TTE 01/2021: IMPRESSIONS   1. Left ventricular ejection fraction, by estimation, is 30 to 35%. The  left ventricle has moderately decreased function. The  left ventricle has  no regional wall motion abnormalities. The left ventricular internal  cavity size was mildly dilated. Left  ventricular diastolic parameters are consistent with Grade I diastolic  dysfunction (impaired relaxation).   2. Right ventricular systolic function is moderately reduced. The right  ventricular size is normal. There is normal pulmonary artery systolic  pressure.   3. Left atrial size was mildly dilated.   4. The mitral valve is normal in structure. Trivial mitral valve  regurgitation. No evidence of mitral stenosis.   5. The aortic valve is normal in structure. Aortic valve regurgitation is  not visualized. No aortic stenosis is present.   6. The inferior vena cava is normal in size with greater than 50%  respiratory variability, suggesting right atrial pressure of 3 mmHg.   TTE 04/22/20:  1. Left ventricular ejection fraction, by estimation, is 35 to 40%. The  left ventricle has normal function. The left ventricle has no regional  wall motion abnormalities. Left ventricular diastolic parameters are  indeterminate.   2. Right ventricular systolic function is normal. The right ventricular  size is normal.   3. The mitral valve is normal in structure. No evidence of mitral valve  regurgitation. No evidence of mitral stenosis.   4. The aortic valve is tricuspid. Aortic valve regurgitation is not  visualized. No aortic stenosis is present.   5. The inferior vena cava is normal in size with greater than 50%  respiratory variability, suggesting right atrial pressure of 3 mmHg.   CPX 12/19/19: Exercise Time:    11:00   Speed (mph): 3.0       Grade (%): 7.5     RPE: 17   Reason stopped: dyspnea (7/10)   Additional symptoms: None reported   Resting HR: 85 Standing HR: 86 Peak HR: 161   (100% age predicted max HR)   BP rest: 128/84 Standing BP: 116/82 BP peak: 166/72   Peak VO2: 17.7 (97%% predicted peak VO2)   VE/VCO2 slope:  29   OUES: 2.02   Peak RER:  1.08   Ventilatory Threshold: 13.0 (71% predicted or measured peak VO2)   Peak RR 38   Peak Ventilation:  56.8   VE/MVV:  54%   PETCO2 at  peak:  38   O2pulse:  11   (100% predicted O2pulse)    Interpretation   Notes: Patient gave a very good effort. Pulse-oximetry remained 97-99% for the duration of exercise.   ECG:  Resting ECG in sinus rhythm with occasional PVCs. HR response appropriate. There were frequent PVCs during exercise with no sustained arrhythmias or ST-T changes.   PFT:  Pre-exercise spirometry was within normal limits. The MVV was normal.   CPX:  Exercise testing with gas exchange demonstrates a normal peak VO2 of 17.7 ml/kg/min (97% of the age/gender/weight matched sedentary norms). The RER of 1.08 indicates a near maximal effort. When adjusted to the patient's ideal body weight of 153.1 lb (69.5 kg) the peak VO2 is 24.7 ml/kg (ibw)/min (108% of the ibw-adjusted predicted). The VE/VCO2 slope is normal. The oxygen uptake efficiency slope (OUES) is normal. The VO2 at the ventilatory threshold was normal at 71% of the predicted peak VO2. At peak exercise, the ventilation reached 54% of the measured MVV indicating ventilatory reserve remained. The O2pulse (a surrogate for stroke volume) increased with incremental exercise, reaching peak 11 ml/beat (100% predicted).    Conclusion: Exercise testing with gas exchange demonstrates normal functional capacity when compared to matched sedentary norms. There is no clear cardiopulmonary limitation. With corrections for ideal body weight, patient's body habitus appears the limiting factor. [name] CPX essentially unchanged from 2018.  Cath 2017: There is severe left ventricular systolic dysfunction. LV end diastolic pressure is normal. The left ventricular ejection fraction is 25-35% by visual estimate.   1. Normal coronary anatomy 2. Severe LV dysfunction 3. Elevated LV EDP   Plan: medical management.     EKG:  EKG is  personally reviewed. 07/27/2022:  EKG was not ordered. 06/18/2022 (ED):  Sinus rhythm at 78 bpm. Low voltage, precordial leads.  03/01/2022:  Sinus rhythm. Rate 58 bpm.   02/04/2021: NSR with HR 72  Recent Labs: 04/19/2022: ALT 10 06/18/2022: BUN 16; Creatinine, Ser 0.99; Hemoglobin 11.4; Magnesium 1.9; Platelets 226; Potassium 3.4; Sodium 141; TSH 0.154   Recent Lipid Panel    Component Value Date/Time   CHOL 186 03/07/2022 0940   TRIG 54 03/07/2022 0940   HDL 88 03/07/2022 0940   CHOLHDL 2.1 03/07/2022 0940   LDLCALC 88 03/07/2022 0940     Physical Exam:    VS:  BP 122/66   Pulse 64   Ht '5\' 8"'$  (1.727 m)   Wt 210 lb 9.6 oz (95.5 kg)   SpO2 97%   BMI 32.02 kg/m     Wt Readings from Last 3 Encounters:  07/27/22 210 lb 9.6 oz (95.5 kg)  07/19/22 211 lb 9.6 oz (96 kg)  06/18/22 207 lb (93.9 kg)     GEN:  Comfortable, NAD HEENT: Normal NECK: No JVD; No carotid bruits CARDIAC: RRR, no murmurs RESPIRATORY:  Clear to auscultation without rales, wheezing or rhonchi  ABDOMEN: Soft, non-tender, non-distended MUSCULOSKELETAL:  No edema; No deformity  SKIN: Warm and dry NEUROLOGIC:  Alert and oriented x 3 PSYCHIATRIC:  Normal affect   ASSESSMENT:    1. Palpitations   2. NICM (nonischemic cardiomyopathy) ([place])   3. Chronic systolic CHF (congestive heart failure) ([place])   4. Essential hypertension   5. S/P ICD (internal cardiac defibrillator) procedure   6. Mixed hyperlipidemia   7. PE (pulmonary thromboembolism) (HCC)     PLAN:    In order of problems listed above:  #Newly Diagnosed Afib:  CHADs-vasc 3-4. Patient had episode where she  had HR up to 160s for about 1 hour on 06/18/22. Was symptomatic at that time with lightheadedness, dizziness, palpitations, and chest tightness. Device did not fire. Called EMS and was taken to [facility].  Does not appear rhythm was captured by EMS. No device interrogation performed that I can see, however, device interrogation today reveals that  the episode correlated with Afib. She is currently on apxiaban for recurrent PE. On coreg for rate control with no significant recurrence of symptoms since that time. Will continue current medications and can always adjust BB further if needed.  -Continue apixaban '5mg'$  BID -Continue coreg 3.'125mg'$  BID -Can adjust nodal agent pending symptoms -Follows with Dr. Caryl [name]  #Chronic Systolic HF: #Non-ischemic CM: Longstanding NICM since 2007. Cath 2017 with normal coronaries; TTE 10/2019 with LVEF 25-30%, RV normal. TTE 03/2022 with 35-40%, G1DD, mild MR, normal RAP.  CPX with normal functional capacity. Currently with NYHA class II symptoms. Compensated on exam. -Continue entresto 97/103 mg BID  -Continue coreg 3.'125mg'$  BID -Continue eplerenone '25mg'$  daily -Off laisx -No farxiga to given history of yeast infections -Did not tolerate spiro and could not afford bidil -S/p ICD placement  #Recurrent PE: -On lifelong apixaban '5mg'$  BID  #HTN: Well controlled and at goal <130/90. -Continue coreg and entresto as above  #HLD: -Continue crestor '5mg'$  daily -LDL 88 (goal <100)  #Depression: -Will follow-up with PCP to discuss further as we discussed that treating depression is very important to [name]  Follow-up:  6 months.  Medication Adjustments/Labs and Tests Ordered: Current medicines are reviewed at length with the patient today.  Concerns regarding medicines are outlined above.   Orders Placed This Encounter  Procedures   Basic metabolic panel   LONG TERM MONITOR (3-14 DAYS)   No orders of the defined types were placed in this encounter.  Patient Instructions  Medication Instructions:   Your physician recommends that you continue on your current medications as directed. Please refer to the Current Medication list given to you today.  *If you need a refill on your cardiac medications before your next appointment, please call your pharmacy*   Lab Work:  TODAY--BMET  If you  have labs (blood work) drawn today and your tests are completely normal, you will receive your results only by: [place] (if you have MyChart) OR A paper copy in the mail If you have any lab test that is abnormal or we need to change your treatment, we will call you to review the results.   Follow-Up: At [facility], you and your health needs are our priority.  As part of our continuing mission to provide you with exceptional heart care, we have created designated Provider Care Teams.  These Care Teams include your primary Cardiologist (physician) and Advanced Practice Providers (APPs -  Physician Assistants and Nurse Practitioners) who all work together to provide you with the care you need, when you need it.  We recommend signing up for the patient portal called "MyChart".  Sign up information is provided on this After Visit Summary.  MyChart is used to connect with patients for Virtual Visits (Telemedicine).  Patients are able to view lab/test results, encounter notes, upcoming appointments, etc.  Non-urgent messages can be sent to your provider as well.   To learn more about what you can do with MyChart, go to [email or web address].    Your next appointment:   6 month(s)  The format for your next appointment:   In Person  Provider:   DR. [name]  Important Information About Sugar        I,Mathew Stumpf,acting as a scribe for Freada Bergeron, MD.,have documented all relevant documentation on the behalf of Freada Bergeron, MD,as directed by  Freada Bergeron, MD while in the presence of Freada Bergeron, MD.  I, Freada Bergeron, MD, have reviewed all documentation for this visit. The documentation on 07/27/22 for the exam, diagnosis, procedures, and orders are all accurate and complete.   Signed, Freada Bergeron, MD 07/27/2022  11:05 AM [place]

## 2022-07-27 NOTE — Progress Notes (Unsigned)
Enrolled for [employer] to mail a ZIO XT long term holter monitor to the patients address on file.  

## 2022-07-27 NOTE — Patient Instructions (Addendum)
Medication Instructions:   Your physician recommends that you continue on your current medications as directed. Please refer to the Current Medication list given to you today.  *If you need a refill on your cardiac medications before your next appointment, please call your pharmacy*   Lab Work:  TODAY--BMET  If you have labs (blood work) drawn today and your tests are completely normal, you will receive your results only by: MyChart Message (if you have MyChart) OR A paper copy in the mail If you have any lab test that is abnormal or we need to change your treatment, we will call you to review the results.   Follow-Up: At [facility], you and your health needs are our priority.  As part of our continuing mission to provide you with exceptional heart care, we have created designated Provider Care Teams.  These Care Teams include your primary Cardiologist (physician) and Advanced Practice Providers (APPs -  Physician Assistants and Nurse Practitioners) who all work together to provide you with the care you need, when you need it.  We recommend signing up for the patient portal called "MyChart".  Sign up information is provided on this After Visit Summary.  MyChart is used to connect with patients for Virtual Visits (Telemedicine).  Patients are able to view lab/test results, encounter notes, upcoming appointments, etc.  Non-urgent messages can be sent to your provider as well.   To learn more about what you can do with MyChart, go to [email or web address].    Your next appointment:   6 month(s)  The format for your next appointment:   In Person  Provider:   DR. P[place]ON   Important Information About Sugar       

## 2022-08-08 ENCOUNTER — Ambulatory Visit: Payer: 59 | Admitting: Cardiology

## 2022-08-11 ENCOUNTER — Ambulatory Visit (INDEPENDENT_AMBULATORY_CARE_PROVIDER_SITE_OTHER): Payer: 59

## 2022-08-11 DIAGNOSIS — I428 Other cardiomyopathies: Secondary | ICD-10-CM

## 2022-08-11 LAB — CUP PACEART REMOTE DEVICE CHECK
Battery Remaining Longevity: 60 mo
Battery Remaining Percentage: 70 %
Brady Statistic RA Percent Paced: 1 %
Brady Statistic RV Percent Paced: 1 %
Date Time Interrogation Session: 20231019014400
HighPow Impedance: 50 Ohm
Implantable Lead Implant Date: 20090320
Implantable Lead Implant Date: 20090320
Implantable Lead Location: 753859
Implantable Lead Location: 753860
Implantable Lead Model: 158
Implantable Lead Model: 5076
Implantable Lead Serial Number: 182504
Implantable Pulse Generator Implant Date: 20160307
Lead Channel Impedance Value: 465 Ohm
Lead Channel Impedance Value: 476 Ohm
Lead Channel Pacing Threshold Amplitude: 0.5 V
Lead Channel Pacing Threshold Amplitude: 1.2 V
Lead Channel Pacing Threshold Pulse Width: 0.4 ms
Lead Channel Pacing Threshold Pulse Width: 0.4 ms
Lead Channel Setting Pacing Amplitude: 2 V
Lead Channel Setting Pacing Amplitude: 2.5 V
Lead Channel Setting Pacing Pulse Width: 0.4 ms
Lead Channel Setting Sensing Sensitivity: 0.6 mV
Pulse Gen Serial Number: 111826

## 2022-08-15 ENCOUNTER — Other Ambulatory Visit: Payer: Self-pay | Admitting: Family Medicine

## 2022-08-15 ENCOUNTER — Encounter: Payer: Self-pay | Admitting: Family Medicine

## 2022-08-15 DIAGNOSIS — K589 Irritable bowel syndrome without diarrhea: Secondary | ICD-10-CM

## 2022-08-15 MED ORDER — DICYCLOMINE HCL 10 MG PO CAPS: 10.0000 mg | ORAL_CAPSULE | Freq: Three times a day (TID) | ORAL | 1 refills | Status: DC

## 2022-08-16 ENCOUNTER — Ambulatory Visit: Payer: 59 | Admitting: Family Medicine

## 2022-08-23 NOTE — Progress Notes (Signed)
Remote ICD transmission.   

## 2022-08-26 ENCOUNTER — Ambulatory Visit
Admission: EM | Admit: 2022-08-26 | Discharge: 2022-08-26 | Disposition: A | Discharge status: Home / Self Care | Payer: 59 | Attending: Family Medicine | Admitting: Family Medicine

## 2022-08-26 ENCOUNTER — Encounter: Payer: Self-pay | Admitting: Emergency Medicine

## 2022-08-26 ENCOUNTER — Ambulatory Visit: Payer: 59

## 2022-08-26 DIAGNOSIS — J069 Acute upper respiratory infection, unspecified: Secondary | ICD-10-CM

## 2022-08-26 DIAGNOSIS — R509 Fever, unspecified: Secondary | ICD-10-CM | POA: Diagnosis not present

## 2022-08-26 DIAGNOSIS — U071 COVID-19: Secondary | ICD-10-CM | POA: Insufficient documentation

## 2022-08-26 LAB — RESP PANEL BY RT-PCR (FLU A&B, COVID) ARPGX2
Influenza A by PCR: NEGATIVE
Influenza B by PCR: NEGATIVE
SARS Coronavirus 2 by RT PCR: POSITIVE — AB

## 2022-08-26 MED ORDER — FLUTICASONE PROPIONATE 50 MCG/ACT NA SUSP: 1.0000 | Freq: Two times a day (BID) | NASAL | 2 refills | Status: DC

## 2022-08-26 MED ORDER — PROMETHAZINE-DM 6.25-15 MG/5ML PO SYRP: 5.0000 mL | ORAL_SOLUTION | Freq: Four times a day (QID) | ORAL | 0 refills | Status: DC | PRN

## 2022-08-26 NOTE — ED Triage Notes (Signed)
Nasal congestion started on Sunday.  Chills, cough, and chest congestion and headache started on Tuesday.  Fever on Wednesday.  Has been taking tylenol for fever.  Home covid test was negative on Wednesday.

## 2022-08-26 NOTE — ED Provider Notes (Signed)
[facility]    CSN: [id number] Arrival date & time: 08/26/22  1445      History   Chief Complaint No chief complaint on file.   HPI [name] EDISON WOLLSCHLAGER is a 61 y.o. female.   Presenting today with 5-day history of fever, chills, nasal congestion, cough, headache, fatigue.  Denies chest pain, shortness of breath, abdominal pain, nausea vomiting or diarrhea.  So far trying Tylenol, Mucinex with minimal relief.  Home COVID test negative several days ago.  No known sick contacts or chronic pulmonary disease per patient.    Past Medical History:  Diagnosis Date   AICD (automatic cardioverter/defibrillator) present 09   [employer]- replaced 2016   Arthritis    Bradycardia    Breast cancer ([place]) 02/24/2016   s/p lumpectomy and radiation but refused chemotherapy   Chronic systolic CHF (congestive heart failure) (HCC)    Cough due to angiotensin-converting enzyme inhibitor    Dizziness    Dizziness with standing after squatting, March, 2012   Dyslipidemia    LDL elevation,Patient does not want statin   Family history of breast cancer    GERD (gastroesophageal reflux disease)    H/O shortness of breath    CPX 10/18:  No evidence of cardiopulmonary limitation. Suspect exercise intolerance related to weight and deconditioning.    Hypothyroidism    ICD (implantable cardiac defibrillator) battery depletion    Dr. [name], June, 2009(artifactual atrial tachycardia response events addressed by reprogramming in parentheses   Leg swelling    right leg   Mitral regurgitation    Nonischemic cardiomyopathy ([place])    Etiology unknown, diagnosed 2008   Paroxysmal atrial fibrillation ([place]) 06/20/2022   Personal history of radiation therapy 2017   Pneumonia due to COVID-19 virus 10/2019   TIA (transient ischemic attack)    No CT or MRI abnormality, aspirin therapy    Patient Active Problem List   Diagnosis Date Noted   Paroxysmal atrial fibrillation ([place]) 06/20/2022   Urge  incontinence of urine 05/17/2022   Obesity (BMI 30-39.9) 04/13/2022   Acute deep vein thrombosis (DVT) of right popliteal vein ([place]) 04/13/2022   Acute pulmonary embolism ([place]) 04/12/2022   Neuropathy 05/19/2021   Goiter 05/10/2021   Vestibulitis, vulvar 05/10/2021   Atrophic vaginitis 05/10/2021   Hypertensive disorder 05/10/2021   Osteopenia 04/14/2021   NICM (nonischemic cardiomyopathy) ([place]) 07/23/2020   Pulmonary embolism ([place]) 04/21/2020   Post-surgical hypothyroidism 03/27/2020   Lumbar arthropathy 07/15/2019   Anemia 07/15/2019   History of colonic polyps 05/07/2019   Lumbar spondylosis with myelopathy 04/19/2019   Genetic testing 11/22/2016   Family history of breast cancer    Breast cancer- radiation June 2016 08/05/2016   Abnormal stress test 08/01/2016   Cardiac LV ejection fraction 30-35% 07/18/2016   Heart disease 04/22/2016   Arm numbness 03/14/2016   Facial numbness 03/14/2016   Breast cancer of upper-inner quadrant of left female breast ([place]) 03/09/2016   Chronic combined systolic and diastolic HF (heart failure) (HCC) 02/26/2014   Ejection fraction < 50%    Itching    Dizziness    Bradycardia    GERD (gastroesophageal reflux disease)    Acquired hypothyroidism    Mixed hyperlipidemia    Leg swelling    Cough due to angiotensin-converting enzyme inhibitor    Dual implantable cardioverter-defibrillator in situ    FOOT PAIN 01/20/2010   History of TIA 2007 01/19/2010    Past Surgical History:  Procedure Laterality Date   BREAST BIOPSY  Left 02/24/2016   U/S Core   BREAST BIOPSY Left 02/24/2016   U/S Core   BREAST LUMPECTOMY Left 04/05/2016   invasive ductal    BREAST LUMPECTOMY WITH RADIOACTIVE SEED AND SENTINEL LYMPH NODE BIOPSY Left 04/05/2016   Procedure: BREAST LUMPECTOMY WITH RADIOACTIVE SEED AND SENTINEL LYMPH NODE BIOPSY;  Surgeon: [name], MD;  Location: [facility];  Service: General;  Laterality: Left;   CARDIAC CATHETERIZATION N/A 08/05/2016    Procedure: Left Heart Cath and Coronary Angiography;  Surgeon: Peter M Martinique, MD;  Location: [facility];  Service: Cardiovascular;  Laterality: N/A;   CARDIAC DEFIBRILLATOR PLACEMENT  09   [employer] no remote   COLONOSCOPY     CYST EXCISION Left    back of leg-lt    IMPLANTABLE CARDIOVERTER DEFIBRILLATOR GENERATOR CHANGE N/A 12/29/2014   Procedure: IMPLANTABLE CARDIOVERTER DEFIBRILLATOR GENERATOR CHANGE;  Surgeon: [name], MD;  Location: [facility] C[facility];  Service: Cardiovascular;  Laterality: N/A;   LATERAL EPICONDYLE RELEASE  10/11/2012   Procedure: TENNIS ELBOW RELEASE;  Surgeon: [name], MD;  Location: [facility];  Service: Orthopedics;  Laterality: Right;  RIGHT ELBOW: TENOTOMY ELBOW LATERAL EPICONDYLITIS TENNIS ELBOW: RADIAL TUNNEL RELEASE   PORT-A-CATH REMOVAL  04/13/2016   Procedure: MINOR REMOVAL PORT-A-CATH;  Surgeon: [name], MD;  Location: [facility];  Service: General;;   PORTACATH PLACEMENT Right 04/05/2016   Procedure: INSERTION PORT-A-CATH WITH [place];  Surgeon: [name], MD;  Location: [facility];  Service: General;  Laterality: Right;  right IJ   TOTAL THYROIDECTOMY  2001   TUBAL LIGATION  94   UPPER GI ENDOSCOPY      OB History   No obstetric history on file.      Home Medications    Prior to Admission medications   Medication Sig Start Date End Date Taking? Authorizing Provider  fluticasone (FLONASE) 50 MCG/ACT nasal spray Place 1 spray into both nostrils 2 (two) times daily. 08/26/22  Yes Volney American, PA-C  promethazine-dextromethorphan (PROMETHAZINE-DM) 6.25-15 MG/5ML syrup Take 5 mLs by mouth 4 (four) times daily as needed. 08/26/22  Yes Volney American, PA-C  acetaminophen (TYLENOL) 500 MG tablet Take 500 mg by mouth every 6 (six) hours as needed for mild pain or moderate pain.    [provider]  acetaminophen-codeine (TYLENOL #3) 300-30 MG tablet Take by mouth every 4 (four)  hours as needed.    [provider]  albuterol (VENTOLIN HFA) 108 (90 Base) MCG/ACT inhaler Inhale 2 puffs into the lungs every 6 (six) hours as needed for wheezing or shortness of breath. 05/17/22   Janora Norlander, DO  apixaban (ELIQUIS) 5 MG TABS tablet Take 1 tablet (5 mg total) by mouth 2 (two) times daily. 05/06/22   [place], Maricela Bo, FNP  carvedilol (COREG) 3.125 MG tablet Take 1 tablet (3.125 mg total) by mouth 2 (two) times daily. 05/06/22   [place], Maricela Bo, FNP  cetirizine (ZYRTEC) 10 MG tablet Take 1 tablet by mouth daily.    [provider]  dicyclomine (BENTYL) 10 MG capsule Take 1 capsule (10 mg total) by mouth 4 (four) times daily -  before meals and at bedtime. 08/15/22   Janora Norlander, DO  eplerenone (INSPRA) 25 MG tablet Take 1 tablet (25 mg total) by mouth daily. PLEASE CALL OFFICE TO ARRANGE FOLLOW UP APPOINTMENT 05/06/22   Rafael Bihari, FNP  levothyroxine (SYNTHROID) 75 MCG tablet Take 1 tablet (75 mcg total)  by mouth daily before breakfast. 05/17/22   Ronnie Doss M, DO  omeprazole (PRILOSEC) 40 MG capsule Take 40 mg by mouth daily.    [provider]  rosuvastatin (CRESTOR) 5 MG tablet Take 1 tablet (5 mg total) by mouth daily. 03/22/22   Freada Bergeron, MD  sacubitril-valsartan (ENTRESTO) 97-103 MG Take 1 tablet by mouth 2 (two) times daily. 05/06/22   [place], Maricela Bo, FNP    Family History Family History  Problem Relation Age of Onset   Heart attack Mother    Cancer Mother        breast   Breast cancer Mother 42   Hypertension Sister    Hypertension Brother    Cancer Maternal Aunt        breast   Breast cancer Maternal Aunt    Cancer Paternal Grandmother        possible cancer, unknown type   Stroke Neg Hx    Diabetes Neg Hx     Social History Social History   Tobacco Use   Smoking status: Never   Smokeless tobacco: Never  Vaping Use   Vaping Use: Never used  Substance Use Topics   Alcohol use: No    Drug use: No     Allergies   Avelox [moxifloxacin hcl in nacl], Clindamycin/lincomycin, Kiwi extract, Bactrim [sulfamethoxazole-trimethoprim], Levofloxacin, Simvastatin, Spironolactone, Moxifloxacin hcl, and Tramadol   Review of Systems Review of Systems Per HPI  Physical Exam Triage Vital Signs ED Triage Vitals  Enc Vitals Group     BP 08/26/22 1504 122/79     Pulse Rate 08/26/22 1504 (!) 109     Resp 08/26/22 1504 18     Temp 08/26/22 1504 99.7 F (37.6 C)     Temp Source 08/26/22 1504 Oral     SpO2 08/26/22 1504 95 %     Weight --      Height --      Head Circumference --      Peak Flow --      Pain Score 08/26/22 1505 0     Pain Loc --      Pain Edu? --      Excl. in [place]? --    No data found.  Updated Vital Signs BP 122/79 (BP Location: Right Arm)   Pulse (!) 109   Temp 99.7 F (37.6 C) (Oral)   Resp 18   SpO2 95%   Visual Acuity Right Eye Distance:   Left Eye Distance:   Bilateral Distance:    Right Eye Near:   Left Eye Near:    Bilateral Near:     Physical Exam Vitals and nursing note reviewed.  Constitutional:      Appearance: Normal appearance.  HENT:     Head: Atraumatic.     Right Ear: Tympanic membrane and external ear normal.     Left Ear: Tympanic membrane and external ear normal.     Nose: Rhinorrhea present.     Mouth/Throat:     Mouth: Mucous membranes are moist.     Pharynx: Posterior oropharyngeal erythema present.  Eyes:     Extraocular Movements: Extraocular movements intact.     Conjunctiva/sclera: Conjunctivae normal.  Cardiovascular:     Rate and Rhythm: Normal rate and regular rhythm.     Heart sounds: Normal heart sounds.  Pulmonary:     Effort: Pulmonary effort is normal.     Breath sounds: Normal breath sounds. No wheezing or rales.  Musculoskeletal:  General: Normal range of motion.     Cervical back: Normal range of motion and neck supple.  Skin:    General: Skin is warm and dry.  Neurological:      Mental Status: She is alert and oriented to person, place, and time.  Psychiatric:        Mood and Affect: Mood normal.        Thought Content: Thought content normal.      UC Treatments / Results  Labs (all labs ordered are listed, but only abnormal results are displayed) Labs Reviewed  RESP PANEL BY RT-PCR (FLU A&B, COVID) [id number]    EKG   Radiology No results found.  Procedures Procedures (including critical care time)  Medications Ordered in UC Medications - No data to display  Initial Impression / Assessment and Plan / UC Course  I have reviewed the triage vital signs and the nursing notes.  Pertinent labs & imaging results that were available during my care of the patient were reviewed by me and considered in my medical decision making (see chart for details).     Tachycardic in triage, otherwise vital signs reassuring.  Suspect viral upper respiratory infection, possibly COVID-19.  Respiratory panel pending, treat with Phenergan DM, Flonase, supportive over-the-counter medications and home care.  Consider molnupiravir if positive.  Final Clinical Impressions(s) / UC Diagnoses   Final diagnoses:  Viral URI with cough  Fever, unspecified   Discharge Instructions   None    ED Prescriptions     Medication Sig Dispense Auth. Provider   promethazine-dextromethorphan (PROMETHAZINE-DM) 6.25-15 MG/5ML syrup Take 5 mLs by mouth 4 (four) times daily as needed. 100 mL Volney American, PA-C   fluticasone [facility]) 50 MCG/ACT nasal spray Place 1 spray into both nostrils 2 (two) times daily. 16 g Volney American, [place]      PDMP not reviewed this encounter.   Volney American, [place] 08/26/22 1557

## 2022-08-28 ENCOUNTER — Telehealth: Payer: Self-pay | Admitting: Emergency Medicine

## 2022-08-28 MED ORDER — MOLNUPIRAVIR EUA 200MG CAPSULE: 4.0000 | ORAL_CAPSULE | Freq: Two times a day (BID) | ORAL | 0 refills | Status: AC

## 2022-08-28 NOTE — Telephone Encounter (Signed)
Pt called and reported covid came back positive and inquired about antiviral. Consulted PA and given verbal to electronically send molnupiravir 800 mg PO tablets, 4 capsules BID x5 days. Pt aware and confirmed pharmacy.

## 2022-08-29 ENCOUNTER — Encounter: Payer: Self-pay | Admitting: Family Medicine

## 2022-09-01 ENCOUNTER — Ambulatory Visit: Payer: 59 | Admitting: Cardiology

## 2022-09-05 ENCOUNTER — Encounter: Payer: Self-pay | Admitting: Cardiology

## 2022-09-05 NOTE — Telephone Encounter (Signed)
Sounds like it might help her.  Make sure she is drinking PLENTY of water with mucinex otherwise it will not activate.

## 2022-09-05 NOTE — Progress Notes (Deleted)
Cardiology Office Note:    Date:  09/05/2022   ID:  [name] [name], DOB 1961/08/04, MRN [id number]  PCP:  Janora Norlander, DO   [place] [facility] Cardiologist:  Ena Dawley, MD Electrophysiologist:  [name], MD { Click to update primary MD,subspecialty MD or APP then REFRESH:1}    Referring MD: Janora Norlander, DO   Chief Complaint: ***  History of Present Illness:    [name] [name] is a *** 61 y.o. female with a hx of breast cancer (s/p lumpectomy and XRT -fused chemo), hypothyroidism, HLD, TIA, GERD, chronic HFpEF due to NICM with LVEF 30% s/p AICD placement, and new onset atrial fibrillation.   History of NICM dating back to 2007.  Cardiac cath 2017 with normal coronary arteries and EF 25-30%.  S/p ICD 2009.  Medical therapy has been limited by multiple medication intolerances (spironolactone caused body burning sensation, could not afford BiDil, and had fatigue with high doses of Entresto). Has been followed by Dr. [name] in HF clinic.  Underwent CPX which revealed normal functional capacity.  TTE 01/2021 EF 30 to 90%, RV systolic function moderately reduced.  Had lost 15 pounds at that time.  Admitted 03/2022 with chest pain and shortness of breath.  CT chest showed small RLL PE.  Started on Eliquis.  Echo showed EF 35 to 40%, RV normal.  Was seen in HF clinic by Allena Katz, NP on 05/06/2022 where she had shortness of breath with ADLs and walking.  No chest pain, orthopnea, or LE edema.  No changes in medications at that time.  She will remain on anticoagulation permanently due to recurrent blood clots.  She had previously been on anticoagulation for about 1 year when she had 2 PE in 2021. No significant sleep apnea on sleep study 11/2020.   In cardiology clinic by Dr. [name] on 07/27/2022.  She presented to the ED 06/18/2022 due to an episode of tachycardia in the 160s lasting for about an hour or 2 until EMS arrived.  She was feeling dizzy with a little  bit of chest pressure. When EMS arrived HR had lowered to 150s and continued to decrease on the way to the hospital.  Her defibrillator was not interrogated and she states it did not fire at that time.  Later, device interrogation revealed episode correlated with atrial fibrillation.  Reported episodes with HR as high as 117 bpm shortly after ED visit.  Continues to have shortness of breath with exertion and chronic orthopnea with onset prior to recent COVID infection.  Not on SGLT2 inhibitor due to yeast infections.  Advised to continue Eliquis 5 mg twice daily due to A-fib.  On carvedilol 3.125 mg twice daily.  Now tolerating Entresto 97-103 mg twice daily and Empirin on 25 mg daily.  She was advised to follow-up in 6 months.  Today, she is here for concern for continued cough and congestion following COVID-19 infection.   Past Medical History:  Diagnosis Date   AICD (automatic cardioverter/defibrillator) present 09   [employer]- replaced 2016   Arthritis    Bradycardia    Breast cancer ([place]) 02/24/2016   s/p lumpectomy and radiation but refused chemotherapy   Chronic systolic CHF (congestive heart failure) (HCC)    Cough due to angiotensin-converting enzyme inhibitor    Dizziness    Dizziness with standing after squatting, March, 2012   Dyslipidemia    LDL elevation,Patient does not want statin   Family history of breast cancer  GERD (gastroesophageal reflux disease)    H/O shortness of breath    CPX 10/18:  No evidence of cardiopulmonary limitation. Suspect exercise intolerance related to weight and deconditioning.    Hypothyroidism    ICD (implantable cardiac defibrillator) battery depletion    Dr. [name], June, 2009(artifactual atrial tachycardia response events addressed by reprogramming in parentheses   Leg swelling    right leg   Mitral regurgitation    Nonischemic cardiomyopathy ([place])    Etiology unknown, diagnosed 2008   Paroxysmal atrial fibrillation ([place]) 06/20/2022    Personal history of radiation therapy 2017   Pneumonia due to COVID-19 virus 10/2019   TIA (transient ischemic attack)    No CT or MRI abnormality, aspirin therapy    Past Surgical History:  Procedure Laterality Date   BREAST BIOPSY Left 02/24/2016   U/S Core   BREAST BIOPSY Left 02/24/2016   U/S Core   BREAST LUMPECTOMY Left 04/05/2016   invasive ductal    BREAST LUMPECTOMY WITH RADIOACTIVE SEED AND SENTINEL LYMPH NODE BIOPSY Left 04/05/2016   Procedure: BREAST LUMPECTOMY WITH RADIOACTIVE SEED AND SENTINEL LYMPH NODE BIOPSY;  Surgeon: [name], MD;  Location: [facility];  Service: General;  Laterality: Left;   CARDIAC CATHETERIZATION N/A 08/05/2016   Procedure: Left Heart Cath and Coronary Angiography;  Surgeon: Peter M Martinique, MD;  Location: [facility];  Service: Cardiovascular;  Laterality: N/A;   CARDIAC DEFIBRILLATOR PLACEMENT  09   [employer] no remote   COLONOSCOPY     CYST EXCISION Left    back of leg-lt    IMPLANTABLE CARDIOVERTER DEFIBRILLATOR GENERATOR CHANGE N/A 12/29/2014   Procedure: IMPLANTABLE CARDIOVERTER DEFIBRILLATOR GENERATOR CHANGE;  Surgeon: [name], MD;  Location: [facility] CATH LAB;  Service: Cardiovascular;  Laterality: N/A;   LATERAL EPICONDYLE RELEASE  10/11/2012   Procedure: TENNIS ELBOW RELEASE;  Surgeon: [name], MD;  Location: [facility];  Service: Orthopedics;  Laterality: Right;  RIGHT ELBOW: TENOTOMY ELBOW LATERAL EPICONDYLITIS TENNIS ELBOW: RADIAL TUNNEL RELEASE   PORT-A-CATH REMOVAL  04/13/2016   Procedure: MINOR REMOVAL PORT-A-CATH;  Surgeon: [name], MD;  Location: [facility];  Service: General;;   PORTACATH PLACEMENT Right 04/05/2016   Procedure: INSERTION PORT-A-CATH WITH [place];  Surgeon: [name], MD;  Location: [facility];  Service: General;  Laterality: Right;  right IJ   TOTAL THYROIDECTOMY  2001   TUBAL LIGATION  94   UPPER GI ENDOSCOPY      Current Medications: No outpatient  medications have been marked as taking for the 09/08/22 encounter (Appointment) with Emmaline Life, NP.     Allergies:   Avelox [moxifloxacin hcl in nacl], Clindamycin/lincomycin, Kiwi extract, Bactrim [sulfamethoxazole-trimethoprim], Levofloxacin, Simvastatin, Spironolactone, Moxifloxacin hcl, and Tramadol   Social History   Socioeconomic History   Marital status: Widowed    Spouse name: Not on file   Number of children: Not on file   Years of education: Not on file   Highest education level: Not on file  Occupational History   Occupation: Widow  Tobacco Use   Smoking status: Never   Smokeless tobacco: Never  Vaping Use   Vaping Use: Never used  Substance and Sexual Activity   Alcohol use: No   Drug use: No   Sexual activity: Not on file  Other Topics Concern   Not on file  Social History Narrative   Not on file   Social Determinants of Health   Financial Resource Strain: Medium Risk (12/11/2019)  Overall Financial Resource Strain (CARDIA)    Difficulty of Paying Living Expenses: Somewhat hard  Food Insecurity: Not on file  Transportation Needs: No Transportation Needs (12/11/2019)   PRAPARE - Hydrologist (Medical): No    Lack of Transportation (Non-Medical): No  Physical Activity: Insufficiently Active (12/11/2019)   Exercise Vital Sign    Days of Exercise per Week: 1 day    Minutes of Exercise per Session: 10 min  Stress: Not on file  Social Connections: Not on file     Family History: The patient's ***family history includes Breast cancer in her maternal aunt; Breast cancer (age of onset: 61) in her mother; Cancer in her maternal aunt, mother, and paternal grandmother; Heart attack in her mother; Hypertension in her brother and sister. There is no history of Stroke or Diabetes.  ROS:   Please see the history of present illness.    *** All other systems reviewed and are negative.  Labs/Other Studies Reviewed:    The following  studies were reviewed today:  Echo 04/20/22   1. Left ventricular ejection fraction, by estimation, is 35 to 40%. The  left ventricle has moderately decreased function. The left ventricle  demonstrates global hypokinesis. Left ventricular diastolic parameters are  consistent with Grade I diastolic  dysfunction (impaired relaxation).   2. The mitral valve is normal in structure. Mild mitral valve  regurgitation.   3. The aortic valve is tricuspid. Aortic valve regurgitation is not  visualized.   4. The inferior vena cava is normal in size with greater than 50%  respiratory variability, suggesting right atrial pressure of 3 mmHg.   Comparison(s): No significant change from prior study. Prior images  reviewed side by side.  CPX 12/19/19  Conclusion: Exercise testing with gas exchange demonstrates normal functional capacity when compared to matched sedentary norms. There is no clear cardiopulmonary limitation. With corrections for ideal body weight, patient's body habitus appears the limiting factor. Overall CPX essentially unchanged from 2018. Suspect obesity playing a role in her exertional symptoms. There were frequent PVCs during exercise. Consider evaluation for underlying sleep disordered breathing.  LHC 08/05/16  There is severe left ventricular systolic dysfunction. LV end diastolic pressure is normal. The left ventricular ejection fraction is 25-35% by visual estimate.   1. Normal coronary anatomy 2. Severe LV dysfunction 3. Elevated LV EDP   Plan: medical management.   Recent Labs: 04/19/2022: ALT 10 06/18/2022: Hemoglobin 11.4; Magnesium 1.9; Platelets 226; TSH 0.154 07/27/2022: BUN 9; Creatinine, Ser 0.80; Potassium 4.4; Sodium 144  Recent Lipid Panel    Component Value Date/Time   CHOL 186 03/07/2022 0940   TRIG 54 03/07/2022 0940   HDL 88 03/07/2022 0940   CHOLHDL 2.1 03/07/2022 0940   LDLCALC 88 03/07/2022 0940     Risk Assessment/Calculations:   {Does this  patient have ATRIAL FIBRILLATION?:[phone]}       Physical Exam:    VS:  There were no vitals taken for this visit.    Wt Readings from Last 3 Encounters:  07/27/22 210 lb 9.6 oz (95.5 kg)  07/19/22 211 lb 9.6 oz (96 kg)  06/18/22 207 lb (93.9 kg)     GEN: *** Well nourished, well developed in no acute distress HEENT: Normal NECK: No JVD; No carotid bruits CARDIAC: ***RRR, no murmurs, rubs, gallops RESPIRATORY:  Clear to auscultation without rales, wheezing or rhonchi  ABDOMEN: Soft, non-tender, non-distended MUSCULOSKELETAL:  No edema; No deformity. *** pedal pulses, ***bilaterally SKIN: Warm and  dry NEUROLOGIC:  Alert and oriented x 3 PSYCHIATRIC:  Normal affect   EKG:  EKG is *** ordered today.  The ekg ordered today demonstrates ***  No BP recorded.  {Refresh Note OR Click here to enter BP  :1}***    Diagnoses:    No diagnosis found. Assessment and Plan:     NICM/Chronic HFrEF: PAF on chronic anticoagulation:   {Are you ordering a CV Procedure (e.g. stress test, cath, DCCV, TEE, etc)?   Press F2        :[id number]}   Disposition:  Medication Adjustments/Labs and Tests Ordered: Current medicines are reviewed at length with the patient today.  Concerns regarding medicines are outlined above.  No orders of the defined types were placed in this encounter.  No orders of the defined types were placed in this encounter.   There are no Patient Instructions on file for this visit.   Signed, Emmaline Life, NP  09/05/2022 5:52 PM    [place]

## 2022-09-06 ENCOUNTER — Ambulatory Visit (INDEPENDENT_AMBULATORY_CARE_PROVIDER_SITE_OTHER): Payer: 59 | Admitting: Family Medicine

## 2022-09-06 ENCOUNTER — Encounter: Payer: Self-pay | Admitting: Family Medicine

## 2022-09-06 ENCOUNTER — Ambulatory Visit (INDEPENDENT_AMBULATORY_CARE_PROVIDER_SITE_OTHER): Payer: 59

## 2022-09-06 VITALS — BP 102/69 | HR 71 | Temp 97.4°F | Ht 68.0 in | Wt 211.8 lb

## 2022-09-06 DIAGNOSIS — J189 Pneumonia, unspecified organism: Secondary | ICD-10-CM | POA: Diagnosis not present

## 2022-09-06 DIAGNOSIS — R059 Cough, unspecified: Secondary | ICD-10-CM

## 2022-09-06 MED ORDER — AMOXICILLIN-POT CLAVULANATE 875-125 MG PO TABS: 1.0000 | ORAL_TABLET | Freq: Two times a day (BID) | ORAL | 0 refills | Status: AC

## 2022-09-06 MED ORDER — BENZONATATE 100 MG PO CAPS: 200.0000 mg | ORAL_CAPSULE | Freq: Three times a day (TID) | ORAL | 0 refills | Status: DC | PRN

## 2022-09-06 MED ORDER — AZITHROMYCIN 250 MG PO TABS: ORAL_TABLET | ORAL | 0 refills | Status: DC

## 2022-09-06 NOTE — Progress Notes (Signed)
Subjective:  Patient ID: [name] [name], female    DOB: 08/29/1961, 61 y.o.   MRN: [id number]  Patient Care Team: Janora Norlander, DO as PCP - General (Family Medicine) [name], MD as PCP - Electrophysiology (Cardiology) Dorothy Spark, MD as PCP - Cardiology (Cardiology) Nicholas Lose, MD as Consulting Physician (Hematology and Oncology) Kyung Rudd, MD as Consulting Physician (Radiation Oncology) [name], MD as Consulting Physician (General Surgery)   Chief Complaint:  chest congestion  (Patient states that she is getting over [place]. Took the last anti viral on 11/9.  States she is still having nausea and chest congestion )   HPI: [name] [name] is a 61 y.o. female presenting on 09/06/2022 for chest congestion  (Patient states that she is getting over [place]. Took the last anti viral on 11/9.  States she is still having nausea and chest congestion )   Pt presents today with complaints of ongoing cough, congestion, and shortness of breath post COVID 2 weeks ago. She completed her antivirals 09/01/2022. States the cough is productive at times, dry at times. She does have slight nausea, no vomiting. Denies chest pain, leg swelling, weakness, fatigue, confusion, or decreased urine output.     Relevant past medical, surgical, family, and social history reviewed and updated as indicated.  Allergies and medications reviewed and updated. Data reviewed: Chart in Epic.   Past Medical History:  Diagnosis Date   AICD (automatic cardioverter/defibrillator) present 09   [employer]- replaced 2016   Arthritis    Bradycardia    Breast cancer ([place]) 02/24/2016   s/p lumpectomy and radiation but refused chemotherapy   Chronic systolic CHF (congestive heart failure) (HCC)    Cough due to angiotensin-converting enzyme inhibitor    Dizziness    Dizziness with standing after squatting, March, 2012   Dyslipidemia    LDL elevation,Patient does not want statin   Family  history of breast cancer    GERD (gastroesophageal reflux disease)    H/O shortness of breath    CPX 10/18:  No evidence of cardiopulmonary limitation. Suspect exercise intolerance related to weight and deconditioning.    Hypothyroidism    ICD (implantable cardiac defibrillator) battery depletion    Dr. [name], June, 2009(artifactual atrial tachycardia response events addressed by reprogramming in parentheses   Leg swelling    right leg   Mitral regurgitation    Nonischemic cardiomyopathy ([place])    Etiology unknown, diagnosed 2008   Paroxysmal atrial fibrillation ([place]) 06/20/2022   Personal history of radiation therapy 2017   Pneumonia due to COVID-19 virus 10/2019   TIA (transient ischemic attack)    No CT or MRI abnormality, aspirin therapy    Past Surgical History:  Procedure Laterality Date   BREAST BIOPSY Left 02/24/2016   U/S Core   BREAST BIOPSY Left 02/24/2016   U/S Core   BREAST LUMPECTOMY Left 04/05/2016   invasive ductal    BREAST LUMPECTOMY WITH RADIOACTIVE SEED AND SENTINEL LYMPH NODE BIOPSY Left 04/05/2016   Procedure: BREAST LUMPECTOMY WITH RADIOACTIVE SEED AND SENTINEL LYMPH NODE BIOPSY;  Surgeon: [name], MD;  Location: [facility];  Service: General;  Laterality: Left;   CARDIAC CATHETERIZATION N/A 08/05/2016   Procedure: Left Heart Cath and Coronary Angiography;  Surgeon: Peter M Martinique, MD;  Location: [facility];  Service: Cardiovascular;  Laterality: N/A;   CARDIAC DEFIBRILLATOR PLACEMENT  09   [employer] no remote   COLONOSCOPY     CYST  EXCISION Left    back of leg-lt    IMPLANTABLE CARDIOVERTER DEFIBRILLATOR GENERATOR CHANGE N/A 12/29/2014   Procedure: IMPLANTABLE CARDIOVERTER DEFIBRILLATOR GENERATOR CHANGE;  Surgeon: [name], MD;  Location: [facility] CATH LAB;  Service: Cardiovascular;  Laterality: N/A;   LATERAL EPICONDYLE RELEASE  10/11/2012   Procedure: TENNIS ELBOW RELEASE;  Surgeon: [name], MD;  Location: [facility];   Service: Orthopedics;  Laterality: Right;  RIGHT ELBOW: TENOTOMY ELBOW LATERAL EPICONDYLITIS TENNIS ELBOW: RADIAL TUNNEL RELEASE   PORT-A-CATH REMOVAL  04/13/2016   Procedure: MINOR REMOVAL PORT-A-CATH;  Surgeon: [name], MD;  Location: [facility];  Service: General;;   PORTACATH PLACEMENT Right 04/05/2016   Procedure: INSERTION PORT-A-CATH WITH [place];  Surgeon: [name], MD;  Location: [facility];  Service: General;  Laterality: Right;  right IJ   TOTAL THYROIDECTOMY  2001   TUBAL LIGATION  8   UPPER GI ENDOSCOPY      Social History   Socioeconomic History   Marital status: Widowed    Spouse name: Not on file   Number of children: Not on file   Years of education: Not on file   Highest education level: Not on file  Occupational History   Occupation: Widow  Tobacco Use   Smoking status: Never   Smokeless tobacco: Never  Vaping Use   Vaping Use: Never used  Substance and Sexual Activity   Alcohol use: No   Drug use: No   Sexual activity: Not on file  Other Topics Concern   Not on file  Social History Narrative   Not on file   Social Determinants of Health   Financial Resource Strain: Medium Risk (12/11/2019)   Overall Financial Resource Strain (CARDIA)    Difficulty of Paying Living Expenses: Somewhat hard  Food Insecurity: Not on file  Transportation Needs: No Transportation Needs (12/11/2019)   PRAPARE - Hydrologist (Medical): No    Lack of Transportation (Non-Medical): No  Physical Activity: Insufficiently Active (12/11/2019)   Exercise Vital Sign    Days of Exercise per Week: 1 day    Minutes of Exercise per Session: 10 min  Stress: Not on file  Social Connections: Not on file  Intimate Partner Violence: Not on file    Outpatient Encounter Medications as of 09/06/2022  Medication Sig   acetaminophen (TYLENOL) 500 MG tablet Take 500 mg by mouth every 6 (six) hours as needed for mild pain or moderate pain.    acetaminophen-codeine (TYLENOL #3) 300-30 MG tablet Take by mouth every 4 (four) hours as needed.   albuterol (VENTOLIN HFA) 108 (90 Base) MCG/ACT inhaler Inhale 2 puffs into the lungs every 6 (six) hours as needed for wheezing or shortness of breath.   amoxicillin-clavulanate (AUGMENTIN) 875-125 MG tablet Take 1 tablet by mouth 2 (two) times daily for 7 days.   apixaban (ELIQUIS) 5 MG TABS tablet Take 1 tablet (5 mg total) by mouth 2 (two) times daily.   azithromycin (ZITHROMAX Z-PAK) 250 MG tablet As directed   benzonatate (TESSALON PERLES) 100 MG capsule Take 2 capsules (200 mg total) by mouth 3 (three) times daily as needed for cough.   carvedilol (COREG) 3.125 MG tablet Take 1 tablet (3.125 mg total) by mouth 2 (two) times daily.   cetirizine (ZYRTEC) 10 MG tablet Take 1 tablet by mouth daily.   dicyclomine (BENTYL) 10 MG capsule Take 1 capsule (10 mg total) by mouth 4 (four) times daily -  before meals and at bedtime.   eplerenone (INSPRA) 25 MG tablet Take 1 tablet (25 mg total) by mouth daily. PLEASE CALL OFFICE TO ARRANGE FOLLOW UP APPOINTMENT   fluticasone (FLONASE) 50 MCG/ACT nasal spray Place 1 spray into both nostrils 2 (two) times daily.   levothyroxine (SYNTHROID) 75 MCG tablet Take 1 tablet (75 mcg total) by mouth daily before breakfast.   omeprazole (PRILOSEC) 40 MG capsule Take 40 mg by mouth daily.   promethazine-dextromethorphan (PROMETHAZINE-DM) 6.25-15 MG/5ML syrup Take 5 mLs by mouth 4 (four) times daily as needed.   rosuvastatin (CRESTOR) 5 MG tablet Take 1 tablet (5 mg total) by mouth daily.   sacubitril-valsartan (ENTRESTO) 97-103 MG Take 1 tablet by mouth 2 (two) times daily.   No facility-administered encounter medications on file as of 09/06/2022.    Allergies  Allergen Reactions   Avelox [Moxifloxacin Hcl In Nacl] Shortness Of Breath    Shortness of breath   Clindamycin/Lincomycin Shortness Of Breath and Diarrhea    Breathing/diarrhea     Kiwi Extract  Anaphylaxis    Makes pt. Feel like her throat is closing    Bactrim [Sulfamethoxazole-Trimethoprim] Swelling   Levofloxacin Other (See Comments)    JOINT PAIN JOINT PAIN   Simvastatin Other (See Comments)    Joint pain   Spironolactone Other (See Comments)    burning   Moxifloxacin Hcl Other (See Comments)   Tramadol Nausea Only    Review of Systems  Constitutional:  Positive for activity change and fatigue. Negative for appetite change, chills, diaphoresis, fever and unexpected weight change.  HENT:  Positive for congestion.   Eyes: Negative.  Negative for photophobia and visual disturbance.  Respiratory:  Positive for cough and shortness of breath. Negative for apnea, choking and chest tightness.   Cardiovascular:  Negative for chest pain, palpitations and leg swelling.  Gastrointestinal:  Positive for nausea. Negative for abdominal pain, blood in stool, constipation, diarrhea, rectal pain and vomiting.  Endocrine: Negative.  Negative for polydipsia, polyphagia and polyuria.  Genitourinary:  Negative for decreased urine volume, difficulty urinating, dysuria, frequency and urgency.  Musculoskeletal:  Negative for arthralgias and myalgias.  Skin: Negative.   Allergic/Immunologic: Negative.   Neurological:  Negative for dizziness, weakness and headaches.  Hematological: Negative.   Psychiatric/Behavioral:  Negative for confusion, hallucinations, sleep disturbance and suicidal ideas.   All other systems reviewed and are negative.       Objective:  BP 102/69   Pulse 71   Temp (!) 97.4 F (36.3 C) (Temporal)   Ht '5\' 8"'$  (1.727 m)   Wt 211 lb 12.8 oz (96.1 kg)   SpO2 99%   BMI 32.20 kg/m    Wt Readings from Last 3 Encounters:  09/06/22 211 lb 12.8 oz (96.1 kg)  07/27/22 210 lb 9.6 oz (95.5 kg)  07/19/22 211 lb 9.6 oz (96 kg)    Physical Exam Vitals and nursing note reviewed.  Constitutional:      Appearance: Normal appearance. She is obese.  HENT:     Head:  Normocephalic and atraumatic.     Right Ear: Tympanic membrane, ear canal and external ear normal.     Left Ear: Tympanic membrane, ear canal and external ear normal.     Nose: Congestion present.     Mouth/Throat:     Mouth: Mucous membranes are moist.     Pharynx: Oropharynx is clear.  Eyes:     Conjunctiva/sclera: Conjunctivae normal.     Pupils: Pupils are equal, round, and  reactive to light.  Cardiovascular:     Rate and Rhythm: Normal rate and regular rhythm. No extrasystoles are present.    Heart sounds: Normal heart sounds.     Comments: Pacemaker pocket well healed Pulmonary:     Effort: Pulmonary effort is normal. No respiratory distress.     Breath sounds: No stridor. Rhonchi (BLL) present. No wheezing or rales.  Chest:     Chest wall: No tenderness.  Abdominal:     General: Bowel sounds are normal.     Palpations: Abdomen is soft.  Musculoskeletal:     Cervical back: Neck supple.     Right lower leg: No edema.     Left lower leg: No edema.  Skin:    General: Skin is warm and dry.     Capillary Refill: Capillary refill takes less than 2 seconds.  Neurological:     General: No focal deficit present.     Mental Status: She is alert and oriented to person, place, and time.  Psychiatric:        Mood and Affect: Mood normal.        Behavior: Behavior normal.        Thought Content: Thought content normal.        Judgment: Judgment normal.     Results for orders placed or performed during the hospital encounter of 08/26/22  Resp Panel by RT-PCR (Flu A&B, Covid) Anterior Nasal Swab   Specimen: Anterior Nasal Swab  Result Value Ref Range   SARS Coronavirus 2 by RT PCR POSITIVE (A) NEGATIVE   Influenza A by PCR NEGATIVE NEGATIVE   Influenza B by PCR NEGATIVE NEGATIVE     X-Ray: CXR: RLL and LLL consolidation noted when compared to prior 2 chest xrays. Pacemaker wires in place. Preliminary x-ray reading by Monia Pouch, FNP-C, [facility].   Pertinent labs & imaging  results that were available during my care of the patient were reviewed by me and considered in my medical decision making.  Assessment & Plan:  Ellory was seen today for chest congestion .  Diagnoses and all orders for this visit:  Cough in adult -     DG Chest 2 View; Future -     benzonatate (TESSALON PERLES) 100 MG capsule; Take 2 capsules (200 mg total) by mouth 3 (three) times daily as needed for cough.  Community acquired pneumonia, bilateral Imaging with BLL consolidation, not noted on previous imaging. Will initiate treatment for CAP. Pt aware to take medications as prescribed. Continue mucinex with plenty of water. Aware of red flags which require emergent evaluation. Report new, worsening, or persistent symptoms. Follow up in 2 weeks for reevaluation. Aware to use Albuterol every 6 hours for next 3-4 days.  -     amoxicillin-clavulanate (AUGMENTIN) 875-125 MG tablet; Take 1 tablet by mouth 2 (two) times daily for 7 days. -     azithromycin (ZITHROMAX Z-PAK) 250 MG tablet; As directed -     benzonatate (TESSALON PERLES) 100 MG capsule; Take 2 capsules (200 mg total) by mouth 3 (three) times daily as needed for cough.     Continue all other maintenance medications.  Follow up plan: Return in about 2 weeks (around 09/20/2022), or if symptoms worsen or fail to improve, for PNA.   Continue healthy lifestyle choices, including diet (rich in fruits, vegetables, and lean proteins, and low in salt and simple carbohydrates) and exercise (at least 30 minutes of moderate physical activity daily).  Educational handout given for  CAP  The above assessment and management plan was discussed with the patient. The patient verbalized understanding of and has agreed to the management plan. Patient is aware to call the clinic if they develop any new symptoms or if symptoms persist or worsen. Patient is aware when to return to the clinic for a follow-up visit. Patient educated on when it is  appropriate to go to the emergency department.   Monia Pouch, FNP-C [facility] [phone]

## 2022-09-08 ENCOUNTER — Encounter: Payer: Self-pay | Admitting: Family Medicine

## 2022-09-08 ENCOUNTER — Ambulatory Visit: Payer: 59 | Admitting: Nurse Practitioner

## 2022-09-19 ENCOUNTER — Encounter: Payer: Self-pay | Admitting: Family Medicine

## 2022-09-20 ENCOUNTER — Encounter: Payer: Self-pay | Admitting: Family

## 2022-09-20 ENCOUNTER — Ambulatory Visit (INDEPENDENT_AMBULATORY_CARE_PROVIDER_SITE_OTHER): Payer: 59 | Admitting: Family

## 2022-09-20 ENCOUNTER — Ambulatory Visit (INDEPENDENT_AMBULATORY_CARE_PROVIDER_SITE_OTHER): Payer: 59

## 2022-09-20 VITALS — BP 110/74 | HR 90 | Temp 98.3°F | Ht 68.0 in | Wt 211.2 lb

## 2022-09-20 DIAGNOSIS — Z8616 Personal history of COVID-19: Secondary | ICD-10-CM

## 2022-09-20 DIAGNOSIS — R059 Cough, unspecified: Secondary | ICD-10-CM | POA: Diagnosis not present

## 2022-09-20 DIAGNOSIS — R051 Acute cough: Secondary | ICD-10-CM

## 2022-09-20 MED ORDER — PREDNISONE 10 MG (21) PO TBPK: ORAL_TABLET | ORAL | 0 refills | Status: DC

## 2022-09-20 MED ORDER — ALBUTEROL SULFATE HFA 108 (90 BASE) MCG/ACT IN AERS: 2.0000 | INHALATION_SPRAY | Freq: Four times a day (QID) | RESPIRATORY_TRACT | 0 refills | Status: DC | PRN

## 2022-09-20 NOTE — Progress Notes (Signed)
Subjective:    Patient ID: [name] [name], female    DOB: 1960-12-27, 61 y.o.   MRN: [id number]  Chief Complaint  Patient presents with   Pneumonia    Tested + for COVID 08/26/22   PT presents to the office today with continued cough and fever. She had COVID 08/26/22. She was given molnupiravir.  She completed this, but then continued to have a cough. She was seen on 09/06/22 and given Augmentin and Zpak. She states she feels the same.  Cough This is a new problem. The current episode started 1 to 4 weeks ago. The problem has been gradually worsening. The problem occurs every few minutes. The cough is Non-productive. Associated symptoms include chills, a fever, myalgias, nasal congestion, shortness of breath and wheezing. Pertinent negatives include no ear congestion, ear pain or headaches. She has tried rest and OTC inhaler for the symptoms. The treatment provided mild relief.      Review of Systems  Constitutional:  Positive for chills and fever.  HENT:  Negative for ear pain.   Respiratory:  Positive for cough, shortness of breath and wheezing.   Musculoskeletal:  Positive for myalgias.  Neurological:  Negative for headaches.  All other systems reviewed and are negative.      Objective:   Physical Exam Vitals reviewed.  Constitutional:      General: She is not in acute distress.    Appearance: She is well-developed.  HENT:     Head: Normocephalic and atraumatic.     Right Ear: Tympanic membrane normal.     Left Ear: Tympanic membrane normal.  Eyes:     Pupils: Pupils are equal, round, and reactive to light.  Neck:     Thyroid: No thyromegaly.  Cardiovascular:     Rate and Rhythm: Normal rate and regular rhythm.     Heart sounds: Normal heart sounds. No murmur heard. Pulmonary:     Effort: Pulmonary effort is normal. No respiratory distress.     Breath sounds: Wheezing present.  Abdominal:     General: Bowel sounds are normal. There is no distension.     Palpations:  Abdomen is soft.     Tenderness: There is no abdominal tenderness.  Musculoskeletal:        General: No tenderness. Normal range of motion.     Cervical back: Normal range of motion and neck supple.  Skin:    General: Skin is warm and dry.  Neurological:     Mental Status: She is alert and oriented to person, place, and time.     Cranial Nerves: No cranial nerve deficit.     Deep Tendon Reflexes: Reflexes are normal and symmetric.  Psychiatric:        Behavior: Behavior normal.        Thought Content: Thought content normal.        Judgment: Judgment normal.      BP 110/74   Pulse 90   Temp 98.3 F (36.8 C) (Temporal)   Ht '5\' 8"'$  (1.727 m)   Wt 211 lb 3.2 oz (95.8 kg)   SpO2 99%   BMI 32.11 kg/m       Assessment & Plan:  [name] [name] comes in today with chief complaint of Pneumonia (Tested + for COVID 08/26/22)   Diagnosis and orders addressed:  1. Acute cough  - predniSONE (STERAPRED UNI-PAK 21 TAB) 10 MG (21) TBPK tablet; Use as directed  Dispense: 21 tablet; Refill: 0 - albuterol (VENTOLIN HFA)  108 (90 Base) MCG/ACT inhaler; Inhale 2 puffs into the lungs every 6 (six) hours as needed for wheezing or shortness of breath.  Dispense: 8 g; Refill: 0 - DG Chest 2 View  2. History of COVID-19    - Take meds as prescribed - Use a cool mist humidifier  -Use saline nose sprays frequently -Force fluids -For any cough or congestion  Use plain Mucinex- regular strength or max strength is fine -For fever or aces or pains- take tylenol or ibuprofen. -Throat lozenges if help -Follow up if symptoms worsen or do not improve     Evelina Dun, FNP

## 2022-09-20 NOTE — Patient Instructions (Signed)

## 2022-09-27 ENCOUNTER — Other Ambulatory Visit: Payer: Self-pay | Admitting: Family Medicine

## 2022-09-27 ENCOUNTER — Encounter: Payer: Self-pay | Admitting: Family Medicine

## 2022-09-27 MED ORDER — AZELASTINE HCL 0.1 % NA SOLN: 1.0000 | Freq: Two times a day (BID) | NASAL | 12 refills | Status: DC

## 2022-09-29 ENCOUNTER — Telehealth: Payer: Self-pay

## 2022-09-29 NOTE — Telephone Encounter (Signed)
Following alert received from [employer] received for Some P waves are undersensed, see presenting rhythm, ? increase sensitivity.  P waves 0.3 mV and device atrial sensitivity programmed to 0.5 mV.

## 2022-10-01 ENCOUNTER — Emergency Department (HOSPITAL_COMMUNITY): Payer: 59

## 2022-10-01 ENCOUNTER — Other Ambulatory Visit: Payer: Self-pay

## 2022-10-01 ENCOUNTER — Emergency Department (HOSPITAL_COMMUNITY)
Admission: EM | Admit: 2022-10-01 | Discharge: 2022-10-01 | Disposition: A | Discharge status: Home / Self Care | Payer: 59 | Attending: Emergency Medicine | Admitting: Emergency Medicine

## 2022-10-01 ENCOUNTER — Encounter (HOSPITAL_COMMUNITY): Payer: Self-pay

## 2022-10-01 DIAGNOSIS — I517 Cardiomegaly: Secondary | ICD-10-CM | POA: Diagnosis not present

## 2022-10-01 DIAGNOSIS — E039 Hypothyroidism, unspecified: Secondary | ICD-10-CM | POA: Diagnosis not present

## 2022-10-01 DIAGNOSIS — R0600 Dyspnea, unspecified: Secondary | ICD-10-CM | POA: Diagnosis not present

## 2022-10-01 DIAGNOSIS — Z20822 Contact with and (suspected) exposure to covid-19: Secondary | ICD-10-CM | POA: Diagnosis not present

## 2022-10-01 DIAGNOSIS — Z95810 Presence of automatic (implantable) cardiac defibrillator: Secondary | ICD-10-CM | POA: Diagnosis not present

## 2022-10-01 DIAGNOSIS — I509 Heart failure, unspecified: Secondary | ICD-10-CM | POA: Diagnosis not present

## 2022-10-01 DIAGNOSIS — U099 Post covid-19 condition, unspecified: Secondary | ICD-10-CM | POA: Diagnosis not present

## 2022-10-01 DIAGNOSIS — Z79899 Other long term (current) drug therapy: Secondary | ICD-10-CM | POA: Diagnosis not present

## 2022-10-01 DIAGNOSIS — Z853 Personal history of malignant neoplasm of breast: Secondary | ICD-10-CM | POA: Diagnosis not present

## 2022-10-01 DIAGNOSIS — Z7901 Long term (current) use of anticoagulants: Secondary | ICD-10-CM | POA: Diagnosis not present

## 2022-10-01 DIAGNOSIS — U071 COVID-19: Secondary | ICD-10-CM | POA: Diagnosis not present

## 2022-10-01 DIAGNOSIS — R0602 Shortness of breath: Secondary | ICD-10-CM | POA: Diagnosis not present

## 2022-10-01 DIAGNOSIS — J9811 Atelectasis: Secondary | ICD-10-CM | POA: Diagnosis not present

## 2022-10-01 DIAGNOSIS — R079 Chest pain, unspecified: Secondary | ICD-10-CM | POA: Diagnosis not present

## 2022-10-01 DIAGNOSIS — R0609 Other forms of dyspnea: Secondary | ICD-10-CM

## 2022-10-01 LAB — RESP PANEL BY RT-PCR (RSV, FLU A&B, COVID)  RVPGX2
Influenza A by PCR: NEGATIVE
Influenza B by PCR: NEGATIVE
Resp Syncytial Virus by PCR: NEGATIVE
SARS Coronavirus 2 by RT PCR: NEGATIVE

## 2022-10-01 LAB — CBC
HCT: 34.1 % — ABNORMAL LOW (ref 36.0–46.0)
Hemoglobin: 10.8 g/dL — ABNORMAL LOW (ref 12.0–15.0)
MCH: 30.1 pg (ref 26.0–34.0)
MCHC: 31.7 g/dL (ref 30.0–36.0)
MCV: 95 fL (ref 80.0–100.0)
Platelets: 245 10*3/uL (ref 150–400)
RBC: 3.59 MIL/uL — ABNORMAL LOW (ref 3.87–5.11)
RDW: 13 % (ref 11.5–15.5)
WBC: 5.3 10*3/uL (ref 4.0–10.5)
nRBC: 0 % (ref 0.0–0.2)

## 2022-10-01 LAB — BASIC METABOLIC PANEL
Anion gap: 5 (ref 5–15)
BUN: 10 mg/dL (ref 8–23)
CO2: 28 mmol/L (ref 22–32)
Calcium: 8.6 mg/dL — ABNORMAL LOW (ref 8.9–10.3)
Chloride: 108 mmol/L (ref 98–111)
Creatinine, Ser: 0.83 mg/dL (ref 0.44–1.00)
GFR, Estimated: >60 mL/min (ref >60)
Glucose, Bld: 92 mg/dL (ref 70–99)
Potassium: 4.6 mmol/L (ref 3.5–5.1)
Sodium: 141 mmol/L (ref 135–145)

## 2022-10-01 LAB — TROPONIN I (HIGH SENSITIVITY)
Troponin I (High Sensitivity): 3 ng/L (ref <18)
Troponin I (High Sensitivity): 3 ng/L (ref <18)

## 2022-10-01 MED ORDER — FLUTICASONE-SALMETEROL 230-21 MCG/ACT IN AERO: 2.0000 | INHALATION_SPRAY | Freq: Two times a day (BID) | RESPIRATORY_TRACT | 12 refills | Status: DC

## 2022-10-01 MED ORDER — IOHEXOL 350 MG/ML SOLN
75.0000 mL | Freq: Once | INTRAVENOUS | Status: AC | PRN
  Administered 2022-10-01: 75 mL via INTRAVENOUS

## 2022-10-01 NOTE — ED Provider Notes (Signed)
[facility] EMERGENCY DEPARTMENT Provider Note   CSN: [id number] Arrival date & time: 10/01/22  3664     History  Chief Complaint  Patient presents with   Chest Pain    [name] [name] is a 61 y.o. female.  Pt is a 61 yo female with a pmhx significant for DVT/PE on Eliquis, nonischemic CM s/p AICD placement, GERD, hypothyroidism, HLD, TIA, CHF, breast cancer, and paroxysmal afib.  Pt was diagnosed with Covid a little over a month ago.  She said she's had sob since then.  She was put on Molnupiravir.  She has also been on mucinex/flonase/and oral steroids.  She feels worse.  She gets sob just walking a few steps.  She is concerned she has a PE again.  She's been compliant with her Eliquis.  Pt does not feel that the steroids, cough medication, or anything has helped her.       Home Medications Prior to Admission medications   Medication Sig Start Date End Date Taking? Authorizing Provider  acetaminophen (TYLENOL) 500 MG tablet Take 500 mg by mouth every 6 (six) hours as needed for mild pain or moderate pain.   Yes [provider]  albuterol (VENTOLIN HFA) 108 (90 Base) MCG/ACT inhaler Inhale 2 puffs into the lungs every 6 (six) hours as needed for wheezing or shortness of breath. 09/20/22  Yes Hawks, Christy A, FNP  apixaban (ELIQUIS) 5 MG TABS tablet Take 1 tablet (5 mg total) by mouth 2 (two) times daily. 05/06/22  Yes Milford, Maricela Bo, FNP  azelastine (ASTELIN) 0.1 % nasal spray Place 1 spray into both nostrils 2 (two) times daily. 09/27/22  Yes Gottschalk, Leatrice Jewels M, DO  carvedilol (COREG) 3.125 MG tablet Take 1 tablet (3.125 mg total) by mouth 2 (two) times daily. 05/06/22  Yes Milford, Maricela Bo, FNP  cetirizine (ZYRTEC) 10 MG tablet Take 1 tablet by mouth daily.   Yes [provider]  dicyclomine (BENTYL) 10 MG capsule Take 1 capsule (10 mg total) by mouth 4 (four) times daily -  before meals and at bedtime. 08/15/22  Yes Gottschalk, Leatrice Jewels M, DO  eplerenone (INSPRA)  25 MG tablet Take 1 tablet (25 mg total) by mouth daily. PLEASE CALL OFFICE TO ARRANGE FOLLOW UP APPOINTMENT 05/06/22  Yes Milford, Maricela Bo, FNP  fluticasone [facility]) 50 MCG/ACT nasal spray Place 1 spray into both nostrils 2 (two) times daily. 08/26/22  Yes Volney American, PA-C  fluticasone-salmeterol (ADVAIR HFA) [id number] MCG/ACT inhaler Inhale 2 puffs into the lungs 2 (two) times daily. 10/01/22  Yes Isla Pence, MD  levothyroxine (SYNTHROID) 75 MCG tablet Take 1 tablet (75 mcg total) by mouth daily before breakfast. 05/17/22  Yes Gottschalk, Ashly M, DO  rosuvastatin (CRESTOR) 5 MG tablet Take 1 tablet (5 mg total) by mouth daily. 03/22/22  Yes Pemberton, Greer Ee, MD  sacubitril-valsartan (ENTRESTO) 97-103 MG Take 1 tablet by mouth 2 (two) times daily. 05/06/22  Yes Milford, Maricela Bo, FNP      Allergies    Avelox [moxifloxacin hcl in nacl], Clindamycin/lincomycin, Kiwi extract, Bactrim [sulfamethoxazole-trimethoprim], Levofloxacin, Simvastatin, Spironolactone, Moxifloxacin hcl, and Tramadol    Review of Systems   Review of Systems  Respiratory:  Positive for cough and shortness of breath.   All other systems reviewed and are negative.   Physical Exam Updated Vital Signs BP 101/79   Pulse 63   Temp 98.3 F (36.8 C) (Oral)   Resp 14   Ht '5\' 8"'$  (1.727 m)   Wt  95.7 kg   SpO2 100%   BMI 32.08 kg/m  Physical Exam Vitals and nursing note reviewed.  Constitutional:      Appearance: She is well-developed.  HENT:     Head: Normocephalic and atraumatic.  Eyes:     Extraocular Movements: Extraocular movements intact.     Pupils: Pupils are equal, round, and reactive to light.  Cardiovascular:     Rate and Rhythm: Normal rate and regular rhythm.     Heart sounds: Normal heart sounds.  Pulmonary:     Effort: Pulmonary effort is normal.     Breath sounds: Normal breath sounds.  Abdominal:     General: Bowel sounds are normal.     Palpations: Abdomen is soft.   Musculoskeletal:        General: Normal range of motion.     Cervical back: Normal range of motion.  Skin:    General: Skin is warm.     Capillary Refill: Capillary refill takes less than 2 seconds.  Neurological:     General: No focal deficit present.     Mental Status: She is alert and oriented to person, place, and time.  Psychiatric:        Mood and Affect: Mood normal.        Behavior: Behavior normal.     ED Results / Procedures / Treatments   Labs (all labs ordered are listed, but only abnormal results are displayed) Labs Reviewed  BASIC METABOLIC PANEL - Abnormal; Notable for the following components:      Result Value   Calcium 8.6 (*)    All other components within normal limits  CBC - Abnormal; Notable for the following components:   RBC 3.59 (*)    Hemoglobin 10.8 (*)    HCT 34.1 (*)    All other components within normal limits  RESP PANEL BY RT-PCR (RSV, FLU A&B, COVID)  [id number]  TROPONIN I (HIGH SENSITIVITY)  TROPONIN I (HIGH SENSITIVITY)    EKG EKG Interpretation  Date/Time:  Saturday October 01 2022 09:22:14 EST Ventricular Rate:  77 PR Interval:  172 QRS Duration: 84 QT Interval:  374 QTC Calculation: 423 R Axis:   23 Text Interpretation: Normal sinus rhythm Low voltage QRS Cannot rule out Anterior infarct , age undetermined Abnormal ECG When compared with ECG of 18-Jun-2022 05:12, PREVIOUS ECG IS PRESENT No significant change since last tracing Confirmed by Isla Pence [phone]) on 10/01/2022 9:24:55 AM  Radiology CT Angio Chest PE W and/or Wo Contrast  Result Date: 10/01/2022 CLINICAL DATA:  61 year old female with chest pain and shortness of breath. Left leg pain. Small right lower lobe pulmonary embolus in June. EXAM: CT ANGIOGRAPHY CHEST WITH CONTRAST TECHNIQUE: Multidetector CT imaging of the chest was performed using the standard protocol during bolus administration of intravenous contrast. Multiplanar CT image reconstructions and MIPs were  obtained to evaluate the vascular anatomy. RADIATION DOSE REDUCTION: This exam was performed according to the departmental dose-optimization program which includes automated exposure control, adjustment of the mA and/or kV according to patient size and/or use of iterative reconstruction technique. CONTRAST:  11m OMNIPAQUE IOHEXOL 350 MG/ML SOLN COMPARISON:  Chest radiographs 0926 hours today. CTA chest 04/12/2022. FINDINGS: Cardiovascular: Good contrast bolus timing in the pulmonary arterial tree. No pulmonary artery filling defect today, right lower lobe branch affected in June now appears normal series 5, image 144. Chronic left chest AICD. Stable cardiomegaly. No pericardial effusion. Minimal calcified aortic atherosclerosis. No obvious calcified coronary artery plaque. Mediastinum/Nodes: Previous  thyroidectomy. No mediastinal mass or lymphadenopathy. Lungs/Pleura: Major airways are patent. Mildly lower lung volumes. Minor dependent atelectasis. No pleural effusion or other acute pulmonary opacity. Upper Abdomen: Negative visible liver, gallbladder, spleen, pancreas, adrenal glands, kidneys, and bowel in the upper abdomen. Musculoskeletal: Negative. Other findings: Chronic postoperative changes to the left breast. No axillary lymphadenopathy. Review of the MIP images confirms the above findings. IMPRESSION: 1. Negative for acute pulmonary embolus. Resolved right lower lobe pulmonary embolus seen in June. 2. No acute or inflammatory process in the chest. 3. Cardiomegaly with AICD. Electronically Signed   By: Genevie Ann M.D.   On: 10/01/2022 11:32   DG Chest 2 View  Result Date: 10/01/2022 CLINICAL DATA:  History of congestive heart failure with one-week of chest pain and shortness of breath EXAM: CHEST - 2 VIEW COMPARISON:  Chest radiograph dated 09/20/2022 FINDINGS: Lines/tubes: Left chest wall ICD leads project over the right atrium and ventricle. Surgical clips project over the thoracic inlet Chest: Lungs are  clear without focal consolidation. Pleura: No pneumothorax or pleural effusion. Heart/mediastinum: Enlarged cardiomediastinal silhouette. Bones: No acute osseous abnormality. IMPRESSION: No active cardiopulmonary disease. Electronically Signed   By: Darrin Nipper M.D.   On: 10/01/2022 09:40    Procedures Procedures    Medications Ordered in ED Medications  iohexol (OMNIPAQUE) 350 MG/ML injection 75 mL (75 mLs Intravenous Contrast Given 10/01/22 1116)    ED Course/ Medical Decision Making/ A&P                           Medical Decision Making Amount and/or Complexity of Data Reviewed Labs: ordered. Radiology: ordered.  Risk Prescription drug management.   This patient presents to the ED for concern of sob, this involves an extensive number of treatment options, and is a complaint that carries with it a high risk of complications and morbidity.  The differential diagnosis includes pna, pe, covid/flu/rsv   Co morbidities that complicate the patient evaluation  DVT/PE on Eliquis, nonischemic CM s/p AICD placement, GERD, hypothyroidism, HLD, TIA, CHF, breast cancer, and paroxysmal afib   Additional history obtained:  Additional history obtained from epic chart    Lab Tests:  I Ordered, and personally interpreted labs.  The pertinent results include:  cbc with mild anemia (hgb 10.8 which is chronic), bmp nl, trop nl; covid/flu/rsv neg   Imaging Studies ordered:  I ordered imaging studies including cxr and ct chest  I independently visualized and interpreted imaging which showed  CXR: No active cardiopulmonary disease.  CT chest: 1. Negative for acute pulmonary embolus. Resolved right lower lobe  pulmonary embolus seen in June.  2. No acute or inflammatory process in the chest.  3. Cardiomegaly with AICD.   I agree with the radiologist interpretation   Cardiac Monitoring:  The patient was maintained on a cardiac monitor.  I personally viewed and interpreted the cardiac  monitored which showed an underlying rhythm of: nsr   Medicines ordered and prescription drug management:   I have reviewed the patients home medicines and have made adjustments as needed   Test Considered:  ct  Problem List / ED Course:  Post-covid sob:  ct did not show evidence of PE, PNA, or anything acute.  Cardiac eval neg.  Pt could possibly have post covid induced dyspnea.  Pt referred to pulm.  I have given her a rx for advair.  Pt is able to ambulate with good O2 sats, but she does get  sob easily.  Pt instructed to return if worse.    Reevaluation:  After the interventions noted above, I reevaluated the patient and found that they have :improved   Social Determinants of Health:  Lives at home   Dispostion:  After consideration of the diagnostic results and the patients response to treatment, I feel that the patent would benefit from discharge with outpatient f/u.          Final Clinical Impression(s) / ED Diagnoses Final diagnoses:  Post-COVID chronic dyspnea    Rx / DC Orders ED Discharge Orders          Ordered    fluticasone-salmeterol (ADVAIR HFA) 230-21 MCG/ACT inhaler  2 times daily        10/01/22 1406    Ambulatory referral to Pulmonology        10/01/22 1408              Isla Pence, MD 10/01/22 1410

## 2022-10-01 NOTE — ED Triage Notes (Signed)
Patient states one week of CP and SHOB with hx of CHF. Patient also complains of left leg pain with hx of DVT.

## 2022-10-01 NOTE — ED Notes (Signed)
Pt ambulated one lap around nurse's station. Pt oxygen remained 99-100%. Pt did take two breaks while walking from fatigue.

## 2022-10-03 NOTE — Telephone Encounter (Signed)
Great idea  Thanks SK

## 2022-10-03 NOTE — Telephone Encounter (Signed)
Device Clinic apt made 10/19/22 @ 8:40.

## 2022-10-03 NOTE — Telephone Encounter (Signed)
Please Thanks SK

## 2022-10-10 ENCOUNTER — Telehealth: Payer: Self-pay

## 2022-10-10 ENCOUNTER — Other Ambulatory Visit (HOSPITAL_COMMUNITY): Payer: Self-pay

## 2022-10-10 NOTE — Telephone Encounter (Signed)
Patient Advocate Encounter   Received notification from [place] that prior authorization for Advair HFA 230-21MCG/ACT is required.   PA submitted on 10/10/2022 Key [id number]  Status is pending       Joneen Boers, [place] Patient Advocate Specialist [place] Patient Advocate Team Direct Number: [phone] Fax: [phone]

## 2022-10-11 ENCOUNTER — Other Ambulatory Visit: Payer: Self-pay | Admitting: Family Medicine

## 2022-10-11 DIAGNOSIS — R059 Cough, unspecified: Secondary | ICD-10-CM

## 2022-10-11 DIAGNOSIS — Z8616 Personal history of COVID-19: Secondary | ICD-10-CM

## 2022-10-11 MED ORDER — MOMETASONE FURO-FORMOTEROL FUM 100-5 MCG/ACT IN AERO: 2.0000 | INHALATION_SPRAY | Freq: Two times a day (BID) | RESPIRATORY_TRACT | 99 refills | Status: DC

## 2022-10-11 NOTE — Telephone Encounter (Signed)
Pharmacy Patient Advocate Encounter  Received notification from [place] that the request for prior authorization for Advair HFA 230/21 MCG has been denied due to .

## 2022-10-11 NOTE — Telephone Encounter (Signed)
Patient states her insurance is changing. And does not wish to have alternative sent, states she does not need it

## 2022-10-11 NOTE — Telephone Encounter (Signed)
Looks like this rx came from the ER.  I can send in one of the alternatives.  Just let me [place] know that I'm changing it.

## 2022-10-20 ENCOUNTER — Institutional Professional Consult (permissible substitution): Payer: 59 | Admitting: Internal Medicine

## 2022-10-26 ENCOUNTER — Ambulatory Visit: Payer: Medicaid Other | Attending: Internal Medicine

## 2022-10-26 DIAGNOSIS — I5042 Chronic combined systolic (congestive) and diastolic (congestive) heart failure: Secondary | ICD-10-CM

## 2022-10-26 LAB — CUP PACEART INCLINIC DEVICE CHECK
Date Time Interrogation Session: 20240103120529
Implantable Lead Connection Status: 753985
Implantable Lead Connection Status: 753985
Implantable Lead Implant Date: 20090320
Implantable Lead Implant Date: 20090320
Implantable Lead Location: 753859
Implantable Lead Location: 753860
Implantable Lead Model: 158
Implantable Lead Model: 5076
Implantable Lead Serial Number: 182504
Implantable Pulse Generator Implant Date: 20160307
Lead Channel Sensing Intrinsic Amplitude: 0.7 mV
Lead Channel Sensing Intrinsic Amplitude: 19.8 mV
Lead Channel Setting Pacing Amplitude: 2 V
Lead Channel Setting Pacing Amplitude: 2.5 V
Lead Channel Setting Pacing Pulse Width: 0.4 ms
Lead Channel Setting Sensing Sensitivity: 0.6 mV
Pulse Gen Serial Number: 111826

## 2022-10-26 NOTE — Patient Instructions (Signed)
Per recall

## 2022-10-26 NOTE — Progress Notes (Signed)
Alert received for atrial undersensing.  Atrial sensitivity reprogrammed to 0.2 mV per Dr. [name].

## 2022-10-27 ENCOUNTER — Other Ambulatory Visit (HOSPITAL_COMMUNITY): Payer: Self-pay

## 2022-10-27 ENCOUNTER — Telehealth (HOSPITAL_COMMUNITY): Payer: Self-pay

## 2022-10-27 NOTE — Telephone Encounter (Signed)
Advanced Heart Failure Patient Advocate Encounter  Prior authorization is required for Entresto. PA submitted and APPROVED on 10/27/22.  Key [facility] Effective: 10/27/22 - 10/27/23  Clista Bernhardt, CPhT Rx Patient Advocate Phone: [phone]

## 2022-11-01 ENCOUNTER — Institutional Professional Consult (permissible substitution): Payer: 59 | Admitting: Internal Medicine

## 2022-11-01 NOTE — Progress Notes (Deleted)
[name] [name], female    DOB: 29-Feb-1961   MRN: [id number]   Brief patient profile:  ***  *** referred to pulmonary clinic 11/01/2022 by *** for ***        History of Present Illness  11/01/2022  Pulmonary/ 1st office eval/Habib Kise  No chief complaint on file.    Dyspnea:  *** Cough: *** Sleep: *** SABA use:   Past Medical History:  Diagnosis Date   AICD (automatic cardioverter/defibrillator) present 09   [employer]- replaced 2016   Arthritis    Bradycardia    Breast cancer ([place]) 02/24/2016   s/p lumpectomy and radiation but refused chemotherapy   Chronic systolic CHF (congestive heart failure) (HCC)    Cough due to angiotensin-converting enzyme inhibitor    Dizziness    Dizziness with standing after squatting, March, 2012   Dyslipidemia    LDL elevation,Patient does not want statin   Family history of breast cancer    GERD (gastroesophageal reflux disease)    H/O shortness of breath    CPX 10/18:  No evidence of cardiopulmonary limitation. Suspect exercise intolerance related to weight and deconditioning.    Hypothyroidism    ICD (implantable cardiac defibrillator) battery depletion    Dr. [name], June, 2009(artifactual atrial tachycardia response events addressed by reprogramming in parentheses   Leg swelling    right leg   Mitral regurgitation    Nonischemic cardiomyopathy ([place])    Etiology unknown, diagnosed 2008   Paroxysmal atrial fibrillation ([place]) 06/20/2022   Personal history of radiation therapy 2017   Pneumonia due to COVID-19 virus 10/2019   TIA (transient ischemic attack)    No CT or MRI abnormality, aspirin therapy    Outpatient Medications Prior to Visit  Medication Sig Dispense Refill   acetaminophen (TYLENOL) 500 MG tablet Take 500 mg by mouth every 6 (six) hours as needed for mild pain or moderate pain.     albuterol (VENTOLIN HFA) 108 (90 Base) MCG/ACT inhaler Inhale 2 puffs into the lungs every 6 (six) hours as needed for wheezing or shortness of  breath. 8 g 0   apixaban (ELIQUIS) 5 MG TABS tablet Take 1 tablet (5 mg total) by mouth 2 (two) times daily. 180 tablet 2   azelastine (ASTELIN) 0.1 % nasal spray Place 1 spray into both nostrils 2 (two) times daily. 30 mL 12   carvedilol (COREG) 3.125 MG tablet Take 1 tablet (3.125 mg total) by mouth 2 (two) times daily. 180 tablet 1   cetirizine (ZYRTEC) 10 MG tablet Take 1 tablet by mouth daily.     eplerenone (INSPRA) 25 MG tablet Take 1 tablet (25 mg total) by mouth daily. PLEASE CALL OFFICE TO ARRANGE FOLLOW UP APPOINTMENT 30 tablet 6   fluticasone (FLONASE) 50 MCG/ACT nasal spray Place 1 spray into both nostrils 2 (two) times daily. 16 g 2   levothyroxine (SYNTHROID) 75 MCG tablet Take 1 tablet (75 mcg total) by mouth daily before breakfast. 30 tablet 12   rosuvastatin (CRESTOR) 5 MG tablet Take 1 tablet (5 mg total) by mouth daily. 30 tablet 11   sacubitril-valsartan (ENTRESTO) 97-103 MG Take 1 tablet by mouth 2 (two) times daily. 60 tablet 6   dicyclomine (BENTYL) 10 MG capsule Take 1 capsule (10 mg total) by mouth 4 (four) times daily -  before meals and at bedtime. 360 capsule 1   mometasone-formoterol (DULERA) 100-5 MCG/ACT AERO Inhale 2 puffs into the lungs 2 (two) times daily. Rinse mouth after each use. 13  g PRN   No facility-administered medications prior to visit.     Objective:     There were no vitals taken for this visit.         Assessment   No problem-specific Assessment & Plan notes found for this encounter.     Christinia Gully, MD 11/01/2022

## 2022-11-03 DIAGNOSIS — M5459 Other low back pain: Secondary | ICD-10-CM | POA: Diagnosis not present

## 2022-11-03 DIAGNOSIS — M4716 Other spondylosis with myelopathy, lumbar region: Secondary | ICD-10-CM | POA: Diagnosis not present

## 2022-11-08 ENCOUNTER — Institutional Professional Consult (permissible substitution): Payer: 59 | Admitting: Internal Medicine

## 2022-11-10 ENCOUNTER — Ambulatory Visit: Payer: Medicaid Other | Attending: Internal Medicine

## 2022-11-10 DIAGNOSIS — I428 Other cardiomyopathies: Secondary | ICD-10-CM | POA: Diagnosis not present

## 2022-11-10 LAB — CUP PACEART REMOTE DEVICE CHECK
Battery Remaining Longevity: 54 mo
Battery Remaining Percentage: 64 %
Brady Statistic RA Percent Paced: 1 %
Brady Statistic RV Percent Paced: 1 %
Date Time Interrogation Session: 20240118001200
HighPow Impedance: 54 Ohm
Implantable Lead Connection Status: 753985
Implantable Lead Connection Status: 753985
Implantable Lead Implant Date: 20090320
Implantable Lead Implant Date: 20090320
Implantable Lead Location: 753859
Implantable Lead Location: 753860
Implantable Lead Model: 158
Implantable Lead Model: 5076
Implantable Lead Serial Number: 182504
Implantable Pulse Generator Implant Date: 20160307
Lead Channel Impedance Value: 460 Ohm
Lead Channel Impedance Value: 547 Ohm
Lead Channel Pacing Threshold Amplitude: 0.5 V
Lead Channel Pacing Threshold Amplitude: 1.2 V
Lead Channel Pacing Threshold Pulse Width: 0.4 ms
Lead Channel Pacing Threshold Pulse Width: 0.4 ms
Lead Channel Setting Pacing Amplitude: 2 V
Lead Channel Setting Pacing Amplitude: 2.5 V
Lead Channel Setting Pacing Pulse Width: 0.4 ms
Lead Channel Setting Sensing Sensitivity: 0.6 mV
Pulse Gen Serial Number: 111826

## 2022-11-16 ENCOUNTER — Ambulatory Visit: Payer: Medicaid Other | Attending: Physician Assistant

## 2022-11-16 ENCOUNTER — Other Ambulatory Visit: Payer: Self-pay

## 2022-11-16 DIAGNOSIS — M6281 Muscle weakness (generalized): Secondary | ICD-10-CM

## 2022-11-16 DIAGNOSIS — M5459 Other low back pain: Secondary | ICD-10-CM | POA: Diagnosis not present

## 2022-11-16 NOTE — Therapy (Signed)
OUTPATIENT PHYSICAL THERAPY THORACOLUMBAR EVALUATION   Patient Name: [name] [name] MRN: [id number] DOB:06-10-1961, 62 y.o., female Today's Date: 11/16/2022  END OF SESSION:  PT End of Session - 11/16/22 1314     Visit Number 1    Number of Visits 8    Date for PT Re-Evaluation 01/20/23    PT Start Time 1315    PT Stop Time 1345    PT Time Calculation (min) 30 min    Activity Tolerance Patient limited by pain    Behavior During Therapy [facility] for tasks assessed/performed             Past Medical History:  Diagnosis Date   AICD (automatic cardioverter/defibrillator) present 09   [employer]- replaced 2016   Arthritis    Bradycardia    Breast cancer ([place]) 02/24/2016   s/p lumpectomy and radiation but refused chemotherapy   Chronic systolic CHF (congestive heart failure) (HCC)    Cough due to angiotensin-converting enzyme inhibitor    Dizziness    Dizziness with standing after squatting, March, 2012   Dyslipidemia    LDL elevation,Patient does not want statin   Family history of breast cancer    GERD (gastroesophageal reflux disease)    H/O shortness of breath    CPX 10/18:  No evidence of cardiopulmonary limitation. Suspect exercise intolerance related to weight and deconditioning.    Hypothyroidism    ICD (implantable cardiac defibrillator) battery depletion    Dr. [name], June, 2009(artifactual atrial tachycardia response events addressed by reprogramming in parentheses   Leg swelling    right leg   Mitral regurgitation    Nonischemic cardiomyopathy ([place])    Etiology unknown, diagnosed 2008   Paroxysmal atrial fibrillation ([place]) 06/20/2022   Personal history of radiation therapy 2017   Pneumonia due to COVID-19 virus 10/2019   TIA (transient ischemic attack)    No CT or MRI abnormality, aspirin therapy   Past Surgical History:  Procedure Laterality Date   BREAST BIOPSY Left 02/24/2016   U/S Core   BREAST BIOPSY Left 02/24/2016   U/S Core   BREAST  LUMPECTOMY Left 04/05/2016   invasive ductal    BREAST LUMPECTOMY WITH RADIOACTIVE SEED AND SENTINEL LYMPH NODE BIOPSY Left 04/05/2016   Procedure: BREAST LUMPECTOMY WITH RADIOACTIVE SEED AND SENTINEL LYMPH NODE BIOPSY;  Surgeon: [name], MD;  Location: [place];  Service: General;  Laterality: Left;   CARDIAC CATHETERIZATION N/A 08/05/2016   Procedure: Left Heart Cath and Coronary Angiography;  Surgeon: Peter M Martinique, MD;  Location: [place] CV LAB;  Service: Cardiovascular;  Laterality: N/A;   CARDIAC DEFIBRILLATOR PLACEMENT  09   [employer] no remote   COLONOSCOPY     CYST EXCISION Left    back of leg-lt    IMPLANTABLE CARDIOVERTER DEFIBRILLATOR GENERATOR CHANGE N/A 12/29/2014   Procedure: IMPLANTABLE CARDIOVERTER DEFIBRILLATOR GENERATOR CHANGE;  Surgeon: [name], MD;  Location: [facility] C[facility];  Service: Cardiovascular;  Laterality: N/A;   LATERAL EPICONDYLE RELEASE  10/11/2012   Procedure: TENNIS ELBOW RELEASE;  Surgeon: [name], MD;  Location: [place];  Service: Orthopedics;  Laterality: Right;  RIGHT ELBOW: TENOTOMY ELBOW LATERAL EPICONDYLITIS TENNIS ELBOW: RADIAL TUNNEL RELEASE   PORT-A-CATH REMOVAL  04/13/2016   Procedure: MINOR REMOVAL PORT-A-CATH;  Surgeon: [name], MD;  Location: [place];  Service: General;;   PORTACATH PLACEMENT Right 04/05/2016   Procedure: INSERTION PORT-A-CATH WITH [place];  Surgeon: [name], MD;  Location: [place];  Service: General;  Laterality: Right;  right IJ   TOTAL THYROIDECTOMY  2001   TUBAL LIGATION  56   UPPER GI ENDOSCOPY     Patient Active Problem List   Diagnosis Date Noted   Paroxysmal atrial fibrillation ([place]) 06/20/2022   Urge incontinence of urine 05/17/2022   Obesity (BMI 30-39.9) 04/13/2022   Acute deep vein thrombosis (DVT) of right popliteal vein ([place]) 04/13/2022   Acute pulmonary embolism ([place]) 04/12/2022   Neuropathy 05/19/2021   Goiter 05/10/2021   Vestibulitis, vulvar  05/10/2021   Atrophic vaginitis 05/10/2021   Hypertensive disorder 05/10/2021   Osteopenia 04/14/2021   NICM (nonischemic cardiomyopathy) ([place]) 07/23/2020   Pulmonary embolism ([place]) 04/21/2020   Post-surgical hypothyroidism 03/27/2020   Lumbar arthropathy 07/15/2019   Anemia 07/15/2019   History of colonic polyps 05/07/2019   Lumbar spondylosis with myelopathy 04/19/2019   Genetic testing 11/22/2016   Family history of breast cancer    Breast cancer- radiation June 2016 08/05/2016   Abnormal stress test 08/01/2016   Cardiac LV ejection fraction 30-35% 07/18/2016   Heart disease 04/22/2016   Arm numbness 03/14/2016   Facial numbness 03/14/2016   Breast cancer of upper-inner quadrant of left female breast ([place]) 03/09/2016   Chronic combined systolic and diastolic HF (heart failure) (HCC) 02/26/2014   Ejection fraction < 50%    Itching    Dizziness    Bradycardia    GERD (gastroesophageal reflux disease)    Acquired hypothyroidism    Mixed hyperlipidemia    Leg swelling    Cough due to angiotensin-converting enzyme inhibitor    Dual implantable cardioverter-defibrillator in situ    FOOT PAIN 01/20/2010   History of TIA 2007 01/19/2010   REFERRING PROVIDER: E, Tanner [place], PA-C   REFERRING DIAG: Other spondylosis with myelopathy, lumbar region   Rationale for Evaluation and Treatment: Rehabilitation  THERAPY DIAG:  Other low back pain  Muscle weakness (generalized)  ONSET DATE: 2021  SUBJECTIVE:                                                                                                                                                                                           SUBJECTIVE STATEMENT: Patient reports that she has been having low back pain since 2021 with no known cause. She has noticed that she has begun to have pain into her right leg since she broke her 5th toe on the left foot last year. She has tried injections in her back previously, but these did  not help. She notes that she has trouble sleeping due to her pain.   PERTINENT HISTORY:  Heart failure, atrial fibrillation,  neuropathy, osteopenia, history of a TIA, history of breast cancer, history of COVID-19, osteoarthritis, and patient reported low pain tolerance  PAIN:  Are you having pain? Yes: NPRS scale: 9-10/10 Pain location: left low back Pain description: constant, locking, tight Aggravating factors: twisting, bending, squatting, reaching,walking, or any low back movement Relieving factors: none found   PRECAUTIONS: None  WEIGHT BEARING RESTRICTIONS: No  FALLS:  Has patient fallen in last 6 months? No and she has stumbled a few times.   LIVING ENVIRONMENT: Lives with: lives alone Lives in: House/apartment Stairs: No Has following equipment at home: None  OCCUPATION: disabled  PLOF: Independent  PATIENT GOALS: reduced pain and improved mobility  NEXT MD VISIT: 12/15/22  OBJECTIVE:   PATIENT SURVEYS:  Modified Oswestry 72/100   SCREENING FOR RED FLAGS: Bowel or bladder incontinence: No Spinal tumors: No Cauda equina syndrome: No Compression fracture: No Abdominal aneurysm: No  COGNITION: Overall cognitive status: Within functional limits for tasks assessed     SENSATION: Patient reports intermittent tingling in her legs, but none currently.  POSTURE: rounded shoulders and forward head  PALPATION: TTP: bilateral quadriceps, lumbar paraspinals (L>R), left TFL, gluteals, piriformis, QL, obliques, and hamstring  LUMBAR ROM:   AROM eval  Flexion 22; painful  Extension 4; painful  Right lateral flexion 50% limited  Left lateral flexion 50% limited; familiar pain  Right rotation 25% limited  Left rotation 50% limited; familiar pain   (Blank rows = not tested)  LOWER EXTREMITY ROM: WFL for activities assessed  LOWER EXTREMITY MMT:    MMT Right eval Left eval  Hip flexion 3+/5 3/5  Hip extension    Hip abduction    Hip adduction    Hip  internal rotation    Hip external rotation    Knee flexion 3+/5 3/5  Knee extension 3+/5 3+/5; pulling in quadriceps   Ankle dorsiflexion 3+/5 3/5  Ankle plantarflexion    Ankle inversion    Ankle eversion     (Blank rows = not tested)  LUMBAR SPECIAL TESTS:  Straight leg raise test: Positive and FABER test: Positive  FUNCTIONAL TESTS:  Bed mobility  Supine to sit: 10 seconds  GAIT: Assistive device utilized: None Level of assistance: Complete Independence Comments: unsteady, utilized external support from the wall  TODAY'S TREATMENT:                                                                                                                              DATE:     PATIENT EDUCATION:  Education details: POC, healing, prognosis, and goals for therapy Person educated: Patient Education method: Explanation Education comprehension: verbalized understanding  HOME EXERCISE PROGRAM:   ASSESSMENT:  CLINICAL IMPRESSION: Patient is a 62 y.o. female who was seen today for physical therapy evaluation and treatment for chronic left low back pain.  She presented with high pain severity and irritability as she had to change positions multiple times throughout her evaluation due to  increased pain.  Lumbar active range of motion was able to reproduce her familiar pain in addition to palpation to her lumbar spine musculature.  Recommend that she continue with skilled physical therapy to address her impairments to maximize her functional mobility.  OBJECTIVE IMPAIRMENTS: Abnormal gait, decreased mobility, difficulty walking, decreased ROM, decreased strength, hypomobility, impaired perceived functional ability, impaired tone, postural dysfunction, and pain.   ACTIVITY LIMITATIONS: carrying, lifting, bending, sitting, standing, squatting, sleeping, transfers, bed mobility, and locomotion level  PARTICIPATION LIMITATIONS: meal prep, cleaning, laundry, shopping, and community  activity  PERSONAL FACTORS: Behavior pattern, Time since onset of injury/illness/exacerbation, and 3+ comorbidities: Heart failure, atrial fibrillation, neuropathy, osteopenia, history of a TIA, history of breast cancer, history of COVID-19, osteoarthritis, and patient reported low pain tolerance  are also affecting patient's functional outcome.   REHAB POTENTIAL: Fair    CLINICAL DECISION MAKING: Unstable/unpredictable  EVALUATION COMPLEXITY: High   GOALS: Goals reviewed with patient? Yes  LONG TERM GOALS: Target date: 12/14/22  Patient will be independent with her HEP. Baseline:  Goal status: INITIAL  2.  Patient will be able to complete her daily activities without her familiar pain exceeding 7/10. Baseline:  Goal status: INITIAL  3.  Patient will improve her Oswestry score to 62% (62/100) or less for improved function with her daily activities. Baseline:  Goal status: INITIAL  4.  Patient will be able to demonstrate improved bilateral lower extremity strength to at least 4-/5 for improved function with her daily activities. Baseline:  Goal status: INITIAL  PLAN:  PT FREQUENCY: 2x/week  PT DURATION: 4 weeks  PLANNED INTERVENTIONS: Therapeutic exercises, Therapeutic activity, Neuromuscular re-education, Balance training, Gait training, Patient/Family education, Self Care, Joint mobilization, Stair training, Spinal mobilization, Manual therapy, and Re-evaluation.  PLAN FOR NEXT SESSION: NuStep, lower extremity strengthening, lumbar stabilization, and manual therapy   Darlin Coco, PT 11/16/2022, 2:26 PM

## 2022-11-26 ENCOUNTER — Other Ambulatory Visit (HOSPITAL_COMMUNITY): Payer: Self-pay | Admitting: Family Medicine

## 2022-11-27 NOTE — Progress Notes (Unsigned)
[name] [name], female    DOB: 28-Sep-1961   MRN: [id number]   Brief patient profile:  89  yobf never smoker f/b [name] [name] and [name] [name] for cardiac problems since 2007  referred to pulmonary clinic 11/28/2022 by [name] [name] for "allergies" about the same time covid became prevalent and since then 2 documented covid infections the first one in Jan 2021 last one in Oct 2023.    Notes indicate ACEi cough in 2015 per Dr Ron Parker   Dx R dvt PE p covid and recurred and none  since placed on eliquis "indefinitely"     History of Present Illness  11/28/2022  Pulmonary/ 1st office eval/Azul Brumett sensation of globus x years/ placed on Entresto 12/2015  Chief Complaint  Patient presents with   Consult    Follow up from ED visit.  Chest pressure and cough.  COVID October 2023.  Hx of pulmonary embolism.  Negative with this visit.  Dyspnea:  leans on buggy x one aisle [employer]  Cough: dry cough 24/7 with sensation of something stuck in chest x months   Sleep: flat bed one pillow on side r side down seems to help the cough  SABA use: helps some  Prednisone just made her eat / no improvement in cough   No obvious day to day or daytime pattern/variability or assoc excess/ purulent sputum or mucus plugs or hemoptysis or cp or chest tightness, subjective wheeze or overt sinus or hb symptoms.    Also denies any obvious fluctuation of symptoms with weather or environmental changes or other aggravating or alleviating factors except as outlined above   No unusual exposure hx or h/o childhood pna/ asthma or knowledge of premature birth.  Current Allergies, Complete Past Medical History, Past Surgical History, Family History, and Social History were reviewed in [employer] record.  ROS  The following are not active complaints unless bolded Hoarseness, sore throat, dysphagia= globus sensation , dental problems, itching, sneezing,  nasal congestion or discharge of excess mucus or purulent  secretions, ear ache,   fever, chills, sweats, unintended wt loss or wt gain, classically pleuritic or exertional cp,  orthopnea pnd or arm/hand swelling  or leg swelling, presyncope, palpitations, abdominal pain, anorexia, nausea, vomiting, diarrhea  or change in bowel habits or change in bladder habits, change in stools or change in urine, dysuria, hematuria,  rash, arthralgias, visual complaints, headache, numbness, weakness or ataxia or problems with walking or coordination,  change in mood or  memory.           Past Medical History:  Diagnosis Date   AICD (automatic cardioverter/defibrillator) present 09   [employer]- replaced 2016   Arthritis    Bradycardia    Breast cancer ([place]) 02/24/2016   s/p lumpectomy and radiation but refused chemotherapy   Chronic systolic CHF (congestive heart failure) (HCC)    Cough due to angiotensin-converting enzyme inhibitor    Dizziness    Dizziness with standing after squatting, March, 2012   Dyslipidemia    LDL elevation,Patient does not want statin   Family history of breast cancer    GERD (gastroesophageal reflux disease)    H/O shortness of breath    CPX 10/18:  No evidence of cardiopulmonary limitation. Suspect exercise intolerance related to weight and deconditioning.    Hypothyroidism    ICD (implantable cardiac defibrillator) battery depletion    Dr. Caryl [name], June, 2009(artifactual atrial tachycardia response events addressed by reprogramming in parentheses   Leg swelling  right leg   Mitral regurgitation    Nonischemic cardiomyopathy ([place])    Etiology unknown, diagnosed 2008   Paroxysmal atrial fibrillation ([place]) 06/20/2022   Personal history of radiation therapy 2017   Pneumonia due to COVID-19 virus 10/2019   TIA (transient ischemic attack)    No CT or MRI abnormality, aspirin therapy    Outpatient Medications Prior to Visit  Medication Sig Dispense Refill   acetaminophen (TYLENOL) 500 MG tablet Take 500 mg by mouth every  6 (six) hours as needed for mild pain or moderate pain.     albuterol (VENTOLIN HFA) 108 (90 Base) MCG/ACT inhaler Inhale 2 puffs into the lungs every 6 (six) hours as needed for wheezing or shortness of breath. 8 g 0   apixaban (ELIQUIS) 5 MG TABS tablet Take 1 tablet (5 mg total) by mouth 2 (two) times daily. 180 tablet 2   carvedilol (COREG) 3.125 MG tablet Take 1 tablet (3.125 mg total) by mouth 2 (two) times daily. 180 tablet 1   cetirizine (ZYRTEC) 10 MG tablet Take 1 tablet by mouth daily.     eplerenone (INSPRA) 25 MG tablet TAKE 1 TABLET BY MOUTH ONCE DAILY. PATIENT NEEDS TO CALL OFFICE TO ARRANGE A FOLLOW UP APPOINTMENT 30 tablet 0   levothyroxine (SYNTHROID) 75 MCG tablet Take 1 tablet (75 mcg total) by mouth daily before breakfast. 30 tablet 12   rosuvastatin (CRESTOR) 5 MG tablet Take 1 tablet (5 mg total) by mouth daily. 30 tablet 11   sacubitril-valsartan (ENTRESTO) 97-103 MG Take 1 tablet by mouth 2 (two) times daily. 60 tablet 6   azelastine (ASTELIN) 0.1 % nasal spray Place 1 spray into both nostrils 2 (two) times daily. (Patient not taking: Reported on 11/28/2022) 30 mL 12   fluticasone (FLONASE) 50 MCG/ACT nasal spray Place 1 spray into both nostrils 2 (two) times daily. (Patient not taking: Reported on 11/28/2022) 16 g 2   No facility-administered medications prior to visit.     Objective:     BP 120/82 (BP Location: Left Arm, Patient Position: Sitting, Cuff Size: Large)   Pulse 65   Temp 98.2 F (36.8 C) (Oral)   Ht '5\' 7"'$  (1.702 m)   Wt 212 lb 6.4 oz (96.3 kg)   SpO2 99%   BMI 33.27 kg/m   SpO2: 99 %  Somewhat somber amb female freq throat clearing    HEENT : Oropharynx  pristine/ no cobblestoning or pnds     Nasal turbinates nl    NECK :  without  apparent JVD/ palpable Nodes/TM    LUNGS: no acc muscle use,  Nl contour chest which is clear to A and P bilaterally without cough on insp or exp maneuvers   CV:  RRR  no s3 or murmur or increase in P2, and no  edema   ABD:  soft and nontender    MS:  Nl gait/ ext warm without deformities Or obvious joint restrictions  calf tenderness, cyanosis or clubbing    SKIN: warm and dry without lesions    NEURO:  alert, approp, nl sensorium with  no motor or cerebellar deficits apparent.    I personally reviewed images and agree with radiology impression as follows:   Chest CTa    10/01/22 1. Negative for acute pulmonary embolus. Resolved right lower lobe pulmonary embolus seen in June. 2. No acute or inflammatory process in the chest. 3. Cardiomegaly with AICD.       Assessment   Upper airway cough  syndrome Onset in 2015 while on ACEi - Entresto added 2017 with onset of "allergies" around 2020 - 11/28/2022 rec trial off ACEi and on Valsartan max doses  plus max gerd rx >  She has classic Upper airway cough syndrome (previously labeled PNDS),  is so named because it's frequently impossible to sort out how much is  CR/sinusitis with freq throat clearing (which can be related to primary GERD)   vs  causing  secondary (" extra esophageal")  GERD from wide swings in gastric pressure that occur with throat clearing, often  promoting self use of mint and menthol lozenges that reduce the lower esophageal sphincter tone and exacerbate the problem further in a cyclical fashion.   These are the same pts (now being labeled as having "irritable larynx syndrome" by some cough centers) who not infrequently have a history of having failed to tolerate ace inhibitors,  dry powder inhalers or biphosphonates or report having atypical/extraesophageal reflux symptoms that don't respond to standard doses of PPI  and are easily confused as having aecopd or asthma flares by even experienced allergists/ pulmonologists (myself included).   Rec as above, f/u in 5 weeks in [place] (dyspnea on exertion) Much worse since onset of refractory cough ? Related to Covid initially?  - Echo 04/20/22 LV EF  35 to 15% with  G1 diastolic dysfunction with  Mild MR  -  CTa  10/01/22 neg  -  11/28/2022   Walked on RA  x 2   lap(s) =  approx 500  ft  @ mod pace, stopped due to tired/back pain  with lowest 02 sats 98% and no sob   - rec trial off entresto 11/28/2022 >>>   She has a classic ACEi case on Entresto. The sacubitrilat component of entresto is not and ACEi but it does lead to higher levels of bradykinin (the culprit in ACEi related cough) because it reduces Neprilysin based clearance of bradykinin. The typical symptoms are dry daytime cough (9% per PI) or complaints of a new sensation of globus or excess PNDS (her "allergies" that don't respond to prednisone or Zyrtec)   Will ask Dr Molli Hazard to consider doubling dose of valsartan with close f/u of doe/ bp etc over a 4-6 week period off entresto and see pt back in the [place] clinic.   Each maintenance medication was reviewed in detail including emphasizing most importantly the difference between maintenance and prns and under what circumstances the prns are to be triggered using an action plan format where appropriate.  Total time for H and P, chart review, counseling,  directly observing portions of ambulatory 02 saturation study/ and generating customized AVS unique to this office visit / same day charting = 50 min with pt new to me.                   Christinia Gully, MD 11/28/2022

## 2022-11-28 ENCOUNTER — Encounter: Payer: Self-pay | Admitting: Internal Medicine

## 2022-11-28 ENCOUNTER — Ambulatory Visit: Payer: Medicaid Other | Admitting: Internal Medicine

## 2022-11-28 VITALS — BP 120/82 | HR 65 | Temp 98.2°F | Ht 67.0 in | Wt 212.4 lb

## 2022-11-28 DIAGNOSIS — R0609 Other forms of dyspnea: Secondary | ICD-10-CM | POA: Insufficient documentation

## 2022-11-28 DIAGNOSIS — R058 Other specified cough: Secondary | ICD-10-CM

## 2022-11-28 MED ORDER — PANTOPRAZOLE SODIUM 40 MG PO TBEC: 40.0000 mg | DELAYED_RELEASE_TABLET | Freq: Every day | ORAL | 2 refills | Status: DC

## 2022-11-28 MED ORDER — FAMOTIDINE 20 MG PO TABS: ORAL_TABLET | ORAL | 11 refills | Status: DC

## 2022-11-28 NOTE — Assessment & Plan Note (Signed)
Onset in 2015 while on ACEi - Entresto added 2017 with onset of "allergies" around 2020 - 11/28/2022 rec trial off ACEi and on Valsartan max doses  plus max gerd rx >  She has classic Upper airway cough syndrome (previously labeled PNDS),  is so named because it's frequently impossible to sort out how much is  CR/sinusitis with freq throat clearing (which can be related to primary GERD)   vs  causing  secondary (" extra esophageal")  GERD from wide swings in gastric pressure that occur with throat clearing, often  promoting self use of mint and menthol lozenges that reduce the lower esophageal sphincter tone and exacerbate the problem further in a cyclical fashion.   These are the same pts (now being labeled as having "irritable larynx syndrome" by some cough centers) who not infrequently have a history of having failed to tolerate ace inhibitors,  dry powder inhalers or biphosphonates or report having atypical/extraesophageal reflux symptoms that don't respond to standard doses of PPI  and are easily confused as having aecopd or asthma flares by even experienced allergists/ pulmonologists (myself included).   Rec as above, f/u in 5 weeks in [place] clinic

## 2022-11-28 NOTE — Assessment & Plan Note (Signed)
Much worse since onset of refractory cough ? Related to Covid initially?  - Echo 04/20/22 LV EF  35 to 68% with G1 diastolic dysfunction with  Mild MR  -  CTa  10/01/22 neg  -  11/28/2022   Walked on RA  x 2   lap(s) =  approx 500  ft  @ mod pace, stopped due to tired/back pain  with lowest 02 sats 98% and no sob   - rec trial off entresto 11/28/2022 >>>   She has a classic ACEi case on Entresto. The sacubitrilat component of entresto is not and ACEi but it does lead to higher levels of bradykinin (the culprit in ACEi related cough) because it reduces Neprilysin based clearance of bradykinin. The typical symptoms are dry daytime cough (9% per PI) or complaints of a new sensation of globus or excess PNDS (her "allergies" that don't respond to prednisone or Zyrtec)   Will ask Dr Molli Hazard to consider doubling dose of valsartan with close f/u of doe/ bp etc over a 4-6 week period off entresto and see pt back in the [place] clinic.   Each maintenance medication was reviewed in detail including emphasizing most importantly the difference between maintenance and prns and under what circumstances the prns are to be triggered using an action plan format where appropriate.  Total time for H and P, chart review, counseling,  directly observing portions of ambulatory 02 saturation study/ and generating customized AVS unique to this office visit / same day charting = 50 min with pt new to me.

## 2022-11-28 NOTE — Patient Instructions (Signed)
Pantoprazole (protonix) 40 mg   Take  30-60 min before first meal of the day and Pepcid (famotidine)  20 mg after supper until return to office - this is the best way to tell whether stomach acid is contributing to your problem.     GERD (REFLUX)  is an extremely common cause of respiratory symptoms just like yours , many times with no obvious heartburn at all.    It can be treated with medication, but also with lifestyle changes including elevation of the head of your bed (ideally with 6 -8inch blocks under the headboard of your bed),  Smoking cessation, avoidance of late meals, excessive alcohol, and avoid fatty foods, chocolate, peppermint, colas, red wine, and acidic juices such as orange juice.  NO MINT OR MENTHOL PRODUCTS SO NO COUGH DROPS except LUden's are ok  USE SUGARLESS CANDY INSTEAD (Jolley ranchers or Stover's or Life Savers) or even ice chips will also do - the key is to swallow to prevent all throat clearing. NO OIL BASED VITAMINS - use powdered substitutes.  Avoid fish oil when coughing.   I will send Dr [name] a message to ask to stop the Entresto  x 4-6 weeks and I will see you in 5 weeks [place]

## 2022-11-29 ENCOUNTER — Telehealth: Payer: Self-pay | Admitting: *Deleted

## 2022-11-29 NOTE — Telephone Encounter (Signed)
Clarified with Dr. [name] about follow-up appt to be with her in early March or schedule the pt an appt with an APP in the next week.  Per Dr. [name], schedule the pt to see an APP in the next week or 2 for med management of her heart failure med, entesto.  Pt already has an appt with Dr. [name] in late March, and we will keep that there, just incase she needs additional follow-up for this matter.   Scheduled the pt to come into the office to see Richardson Dopp PA-C for next Wednesday 12/07/22 at 2:45 pm.  Pt aware to arrive 15 mins prior to this appt.   Pt verbalized understanding and agrees with this plan.

## 2022-11-29 NOTE — Telephone Encounter (Signed)
-----   Message from Freada Bergeron, MD sent at 11/28/2022  9:06 PM EST ----- Thank you so much for letting me know. I will see her back and we can certainly trial her off the medication to see if things get better.  Usher Hedberg, do you mind getting her scheduled with [place]? ----- Message ----- From: Tanda Rockers, MD Sent: 11/28/2022   6:58 PM EST To: Freada Bergeron, MD  I have seen 3 or 5 pts with [place] like this lady - had acei cough then placed on entresto and very late in life new "allergies" not responding to prednisone or strong antihistamines who had entresto stopped/ replaced with a double dose of the valsartan component, and improved.  I didn't want to step on your toes, but I did tell the pt I didn't have anyting else to offer her until she came off the entresto for at least 4 weeks,  but that you would make the final call and I defer ti you whether it's worth the risk of a trial off.   I note on her ex test she was limited by fatigue and back pain, not doe, for what it's worth.  Thx!  Ronalee Belts

## 2022-11-30 ENCOUNTER — Ambulatory Visit: Payer: Medicaid Other | Admitting: Family Medicine

## 2022-12-01 ENCOUNTER — Encounter: Payer: Self-pay | Admitting: Family Medicine

## 2022-12-01 ENCOUNTER — Encounter: Payer: Self-pay | Admitting: Physical Therapy

## 2022-12-01 ENCOUNTER — Ambulatory Visit: Payer: Medicaid Other | Attending: Physician Assistant | Admitting: Physical Therapy

## 2022-12-01 ENCOUNTER — Ambulatory Visit (INDEPENDENT_AMBULATORY_CARE_PROVIDER_SITE_OTHER): Payer: Medicaid Other | Admitting: Family Medicine

## 2022-12-01 VITALS — BP 115/81 | HR 70 | Ht 67.0 in | Wt 215.0 lb

## 2022-12-01 DIAGNOSIS — M5459 Other low back pain: Secondary | ICD-10-CM | POA: Diagnosis not present

## 2022-12-01 DIAGNOSIS — B370 Candidal stomatitis: Secondary | ICD-10-CM | POA: Diagnosis not present

## 2022-12-01 DIAGNOSIS — M6281 Muscle weakness (generalized): Secondary | ICD-10-CM | POA: Insufficient documentation

## 2022-12-01 MED ORDER — CLOTRIMAZOLE 10 MG MT TROC: 10.0000 mg | Freq: Every day | OROMUCOSAL | 0 refills | Status: AC

## 2022-12-01 NOTE — Progress Notes (Addendum)
BP 115/81   Pulse 70   Ht 5' 7"$  (1.702 m)   Wt 215 lb (97.5 kg)   SpO2 99%   BMI 33.67 kg/m    Subjective:   Patient ID: [name] [name], female    DOB: 09/24/1961, 62 y.o.   MRN: [id number]  HPI: [name] [name] is a 62 y.o. female presenting on 12/01/2022 for Ritta Slot (Unsure what has caused. She has had sx in the past. No recent ATB or inhaler use.)   HPI Patient is coming in today with a film on her tongue and irritation inflammation inside of her mouth that is been going on over the past few weeks and has been increasing.  She has been trying to use some mouthwashes and scraping her tongue and just feels like it is not getting better.  She does feel like she is seen awake creamy thick substance on her tongue that would not scrape off.  Relevant past medical, surgical, family and social history reviewed and updated as indicated. Interim medical history since our last visit reviewed. Allergies and medications reviewed and updated.  Review of Systems  Constitutional:  Negative for chills and fever.  HENT:  Positive for mouth sores. Negative for congestion, ear discharge, ear pain, sinus pressure, sneezing, sore throat, tinnitus and trouble swallowing.   Respiratory:  Negative for chest tightness and shortness of breath.   Cardiovascular:  Negative for chest pain and leg swelling.  Psychiatric/Behavioral:  Negative for agitation and behavioral problems.   All other systems reviewed and are negative.   Per HPI unless specifically indicated above   Allergies as of 12/01/2022       Reactions   Avelox [moxifloxacin Hcl In Nacl] Shortness Of Breath   Shortness of breath   Clindamycin/lincomycin Shortness Of Breath, Diarrhea   Breathing/diarrhea   Kiwi Extract Anaphylaxis   Makes pt. Feel like her throat is closing   Bactrim [sulfamethoxazole-trimethoprim] Swelling   Levofloxacin Other (See Comments)   JOINT PAIN JOINT PAIN   Simvastatin Other (See Comments)   Joint pain    Spironolactone Other (See Comments)   burning   Moxifloxacin Hcl Other (See Comments)   Tramadol Nausea Only        Medication List        Accurate as of December 01, 2022 11:59 PM. If you have any questions, ask your nurse or doctor.          acetaminophen 500 MG tablet Commonly known as: TYLENOL Take 500 mg by mouth every 6 (six) hours as needed for mild pain or moderate pain.   albuterol 108 (90 Base) MCG/ACT inhaler Commonly known as: VENTOLIN HFA Inhale 2 puffs into the lungs every 6 (six) hours as needed for wheezing or shortness of breath.   apixaban 5 MG Tabs tablet Commonly known as: ELIQUIS Take 1 tablet (5 mg total) by mouth 2 (two) times daily.   carvedilol 3.125 MG tablet Commonly known as: COREG Take 1 tablet (3.125 mg total) by mouth 2 (two) times daily.   cetirizine 10 MG tablet Commonly known as: ZYRTEC Take 1 tablet by mouth daily.   clotrimazole 10 MG troche Commonly known as: MYCELEX Take 1 tablet (10 mg total) by mouth 5 (five) times daily for 7 days. Started by: Fransisca Kaufmann Marsela Kuan, MD   Entresto 97-103 MG Generic drug: sacubitril-valsartan Take 1 tablet by mouth 2 (two) times daily.   eplerenone 25 MG tablet Commonly known as: INSPRA TAKE 1 TABLET BY MOUTH ONCE  DAILY. PATIENT NEEDS TO CALL OFFICE TO ARRANGE A FOLLOW UP APPOINTMENT   famotidine 20 MG tablet Commonly known as: Pepcid One after supper   levothyroxine 75 MCG tablet Commonly known as: SYNTHROID Take 1 tablet (75 mcg total) by mouth daily before breakfast.   pantoprazole 40 MG tablet Commonly known as: Protonix Take 1 tablet (40 mg total) by mouth daily. Take 30-60 min before first meal of the day   rosuvastatin 5 MG tablet Commonly known as: CRESTOR Take 1 tablet (5 mg total) by mouth daily.         Objective:   BP 115/81   Pulse 70   Ht 5' 7"$  (1.702 m)   Wt 215 lb (97.5 kg)   SpO2 99%   BMI 33.67 kg/m   Wt Readings from Last 3 Encounters:  12/07/22 216  lb 6.4 oz (98.2 kg)  12/01/22 215 lb (97.5 kg)  11/28/22 212 lb 6.4 oz (96.3 kg)    Physical Exam Vitals and nursing note reviewed.  Constitutional:      General: She is not in acute distress.    Appearance: She is well-developed. She is not diaphoretic.  HENT:     Mouth/Throat:     Mouth: Mucous membranes are dry. No injury or oral lesions.     Tongue: No lesions.     Pharynx: No pharyngeal swelling, oropharyngeal exudate, posterior oropharyngeal erythema or uvula swelling.     Tonsils: No tonsillar exudate or tonsillar abscesses.     Comments: Tender.  Dry and slightly white but did not see any film today and unable to scrape any film either. Eyes:     Conjunctiva/sclera: Conjunctivae normal.  Cardiovascular:     Rate and Rhythm: Normal rate and regular rhythm.     Heart sounds: Normal heart sounds. No murmur heard. Pulmonary:     Effort: Pulmonary effort is normal. No respiratory distress.     Breath sounds: Normal breath sounds. No wheezing.  Skin:    General: Skin is warm and dry.     Findings: No rash.  Neurological:     Mental Status: She is alert and oriented to person, place, and time.     Coordination: Coordination normal.  Psychiatric:        Behavior: Behavior normal.       Assessment & Plan:   Problem List Items Addressed This Visit   None Visit Diagnoses     Thrush    -  Primary   Relevant Medications   clotrimazole (MYCELEX) 10 MG troche     Will go ahead and treat like thrush on her symptoms but if not improved then she may need to go see ear nose throat because I am not overwhelmingly seen thrush today.  Follow up plan: Return if symptoms worsen or fail to improve.  Counseling provided for all of the vaccine components No orders of the defined types were placed in this encounter.   Caryl Pina, MD [facility] 12/08/2022, 1:45 PM

## 2022-12-01 NOTE — Therapy (Addendum)
OUTPATIENT PHYSICAL THERAPY THORACOLUMBAR TREATMENT   Patient Name: [name] [name] MRN: [id number] DOB:06/04/1961, 62 y.o., female Today's Date: 12/01/2022  END OF SESSION:  PT End of Session - 12/01/22 1559     Visit Number 2    Number of Visits 8    Date for PT Re-Evaluation 01/20/23    PT Start Time 1559    PT Stop Time 1617    PT Time Calculation (min) 18 min    Activity Tolerance Patient limited by pain    Behavior During Therapy Restless             Past Medical History:  Diagnosis Date   AICD (automatic cardioverter/defibrillator) present 09   [employer]- replaced 2016   Arthritis    Bradycardia    Breast cancer (HCC) 02/24/2016   s/p lumpectomy and radiation but refused chemotherapy   Chronic systolic CHF (congestive heart failure) (HCC)    Cough due to angiotensin-converting enzyme inhibitor    Dizziness    Dizziness with standing after squatting, March, 2012   Dyslipidemia    LDL elevation,Patient does not want statin   Family history of breast cancer    GERD (gastroesophageal reflux disease)    H/O shortness of breath    CPX 10/18:  No evidence of cardiopulmonary limitation. Suspect exercise intolerance related to weight and deconditioning.    Hypothyroidism    ICD (implantable cardiac defibrillator) battery depletion    Dr. Graciela Husbands, June, 2009(artifactual atrial tachycardia response events addressed by reprogramming in parentheses   Leg swelling    right leg   Mitral regurgitation    Nonischemic cardiomyopathy (HCC)    Etiology unknown, diagnosed 2008   Paroxysmal atrial fibrillation (HCC) 06/20/2022   Personal history of radiation therapy 2017   Pneumonia due to COVID-19 virus 10/2019   TIA (transient ischemic attack)    No CT or MRI abnormality, aspirin therapy   Past Surgical History:  Procedure Laterality Date   BREAST BIOPSY Left 02/24/2016   U/S Core   BREAST BIOPSY Left 02/24/2016   U/S Core   BREAST LUMPECTOMY Left 04/05/2016    invasive ductal    BREAST LUMPECTOMY WITH RADIOACTIVE SEED AND SENTINEL LYMPH NODE BIOPSY Left 04/05/2016   Procedure: BREAST LUMPECTOMY WITH RADIOACTIVE SEED AND SENTINEL LYMPH NODE BIOPSY;  Surgeon: [name], MD;  Location: [facility] OR;  Service: General;  Laterality: Left;   CARDIAC CATHETERIZATION N/A 08/05/2016   Procedure: Left Heart Cath and Coronary Angiography;  Surgeon: Peter M Swaziland, MD;  Location: [facility] INVASIVE CV LAB;  Service: Cardiovascular;  Laterality: N/A;   CARDIAC DEFIBRILLATOR PLACEMENT  09   [employer] no remote   COLONOSCOPY     CYST EXCISION Left    back of leg-lt    IMPLANTABLE CARDIOVERTER DEFIBRILLATOR GENERATOR CHANGE N/A 12/29/2014   Procedure: IMPLANTABLE CARDIOVERTER DEFIBRILLATOR GENERATOR CHANGE;  Surgeon: [name], MD;  Location: [facility] CATH LAB;  Service: Cardiovascular;  Laterality: N/A;   LATERAL EPICONDYLE RELEASE  10/11/2012   Procedure: TENNIS ELBOW RELEASE;  Surgeon: Loreta Ave, MD;  Location: [facility];  Service: Orthopedics;  Laterality: Right;  RIGHT ELBOW: TENOTOMY ELBOW LATERAL EPICONDYLITIS TENNIS ELBOW: RADIAL TUNNEL RELEASE   PORT-A-CATH REMOVAL  04/13/2016   Procedure: MINOR REMOVAL PORT-A-CATH;  Surgeon: Manus Rudd, MD;  Location: [facility];  Service: General;;   PORTACATH PLACEMENT Right 04/05/2016   Procedure: INSERTION PORT-A-CATH WITH [place];  Surgeon: [name], MD;  Location: [facility] OR;  Service: General;  Laterality: Right;  right IJ   TOTAL THYROIDECTOMY  2001   TUBAL LIGATION  94   UPPER GI ENDOSCOPY     Patient Active Problem List   Diagnosis Date Noted   Upper airway cough syndrome 11/28/2022   DOE (dyspnea on exertion) 11/28/2022   Paroxysmal atrial fibrillation (HCC) 06/20/2022   Urge incontinence of urine 05/17/2022   Obesity (BMI 30-39.9) 04/13/2022   Acute deep vein thrombosis (DVT) of right popliteal vein (HCC) 04/13/2022   Acute pulmonary embolism (HCC) 04/12/2022   Neuropathy  05/19/2021   Goiter 05/10/2021   Vestibulitis, vulvar 05/10/2021   Atrophic vaginitis 05/10/2021   Hypertensive disorder 05/10/2021   Osteopenia 04/14/2021   NICM (nonischemic cardiomyopathy) (HCC) 07/23/2020   Pulmonary embolism (HCC) 04/21/2020   Post-surgical hypothyroidism 03/27/2020   Lumbar arthropathy 07/15/2019   Anemia 07/15/2019   History of colonic polyps 05/07/2019   Lumbar spondylosis with myelopathy 04/19/2019   Genetic testing 11/22/2016   Family history of breast cancer    Breast cancer- radiation June 2016 08/05/2016   Abnormal stress test 08/01/2016   Cardiac LV ejection fraction 30-35% 07/18/2016   Heart disease 04/22/2016   Arm numbness 03/14/2016   Facial numbness 03/14/2016   Breast cancer of upper-inner quadrant of left female breast (HCC) 03/09/2016   Chronic combined systolic and diastolic HF (heart failure) (HCC) 02/26/2014   Ejection fraction < 50%    Itching    Dizziness    Bradycardia    GERD (gastroesophageal reflux disease)    Acquired hypothyroidism    Mixed hyperlipidemia    Leg swelling    Cough due to angiotensin-converting enzyme inhibitor    Dual implantable cardioverter-defibrillator in situ    FOOT PAIN 01/20/2010   History of TIA 2007 01/19/2010   REFERRING PROVIDER: E, Tanner Cyprus, PA-C   REFERRING DIAG: Other spondylosis with myelopathy, lumbar region   Rationale for Evaluation and Treatment: Rehabilitation  THERAPY DIAG:  Other low back pain  Muscle weakness (generalized)  ONSET DATE: 2021  SUBJECTIVE:                                                                                                                                                                                           SUBJECTIVE STATEMENT: Reports pain is mostly on L side but today is on the R side. Has already had an MD visit today and hurting already. Doesn't know how much she can tolerate today. Reports she does have some radicular  symptoms.  PERTINENT HISTORY:  Heart failure, atrial fibrillation, neuropathy, osteopenia, history of a TIA, history of breast cancer, history of COVID-19, osteoarthritis, and patient reported  low pain tolerance  PAIN:  Are you having pain? Yes: NPRS scale: 6/10 Pain location: R low back Pain description: constant, locking, tight Aggravating factors: twisting, bending, squatting, reaching,walking, or any low back movement Relieving factors: none found   PRECAUTIONS: None  PATIENT GOALS: reduced pain and improved mobility  NEXT MD VISIT: 12/15/22  OBJECTIVE:   PATIENT SURVEYS:  Modified Oswestry 72/100   SCREENING FOR RED FLAGS: Bowel or bladder incontinence: No Spinal tumors: No Cauda equina syndrome: No Compression fracture: No Abdominal aneurysm: No  COGNITION: Overall cognitive status: Within functional limits for tasks assessed     SENSATION: Patient reports intermittent tingling in her legs, but none currently.  POSTURE: rounded shoulders and forward head  PALPATION: TTP: bilateral quadriceps, lumbar paraspinals (L>R), left TFL, gluteals, piriformis, QL, obliques, and hamstring  LUMBAR ROM:   AROM eval  Flexion 22; painful  Extension 4; painful  Right lateral flexion 50% limited  Left lateral flexion 50% limited; familiar pain  Right rotation 25% limited  Left rotation 50% limited; familiar pain   (Blank rows = not tested)  LOWER EXTREMITY ROM: WFL for activities assessed  LOWER EXTREMITY MMT:    MMT Right eval Left eval  Hip flexion 3+/5 3/5  Hip extension    Hip abduction    Hip adduction    Hip internal rotation    Hip external rotation    Knee flexion 3+/5 3/5  Knee extension 3+/5 3+/5; pulling in quadriceps   Ankle dorsiflexion 3+/5 3/5  Ankle plantarflexion    Ankle inversion    Ankle eversion     (Blank rows = not tested)  LUMBAR SPECIAL TESTS:  Straight leg raise test: Positive and FABER test: Positive  FUNCTIONAL TESTS:  Bed  mobility  Supine to sit: 10 seconds  GAIT: Assistive device utilized: None Level of assistance: Complete Independence Comments: unsteady, utilized external support from the wall  TODAY'S TREATMENT:                                                                                                                              DATE:                                    EXERCISE LOG  Exercise Repetitions and Resistance Comments  Nustep L3 x2 min Slow pace  Core press  Standing x10 reps 5 sec holds   LAQ AROM x15 reps 7/10  Scapular retraction X5 reps painful  Hip flexion Alternating x10 reps Had to stand for relief  Standing ball roll outs X10 reps Stopped to sit due to pain   Blank cell = exercise not performed today   PATIENT EDUCATION:  Education details: POC, healing, prognosis, and goals for therapy Person educated: Patient Education method: Explanation Education comprehension: verbalized understanding  HOME EXERCISE PROGRAM:  ASSESSMENT:  CLINICAL IMPRESSION: Patient presented in clinic with already  highly rated LBP. Patient insisted on not pushing pain and did not know how much therex she could handle. Patient limited throughout treatment due to pain with every exercise instructed. Reps also limited and patient would request to stop. Patient would sit if standing for limited exercises and vice versa. Patient states that her relief is taking lots of Tylenol and lying supine. By end of treatment patient reported a pulling pain in LE to knees. Patient completes all transitions in very slow manner and very fearful of dealing with increased pain.  PHYSICAL THERAPY DISCHARGE SUMMARY  Visits from Start of Care: 2  Current functional level related to goals / functional outcomes: Patient was unable to meet her goals for skilled physical therapy as she did not complete her recommended plan of care.    Remaining deficits: Pain and lower extremity strength   Education / Equipment: HEP     Patient agrees to discharge. Patient goals were not met. Patient is being discharged due to not returning since the last visit.  Candi Leash, PT, DPT    OBJECTIVE IMPAIRMENTS: Abnormal gait, decreased mobility, difficulty walking, decreased ROM, decreased strength, hypomobility, impaired perceived functional ability, impaired tone, postural dysfunction, and pain.   ACTIVITY LIMITATIONS: carrying, lifting, bending, sitting, standing, squatting, sleeping, transfers, bed mobility, and locomotion level  PARTICIPATION LIMITATIONS: meal prep, cleaning, laundry, shopping, and community activity  PERSONAL FACTORS: Behavior pattern, Time since onset of injury/illness/exacerbation, and 3+ comorbidities: Heart failure, atrial fibrillation, neuropathy, osteopenia, history of a TIA, history of breast cancer, history of COVID-19, osteoarthritis, and patient reported low pain tolerance  are also affecting patient's functional outcome.   REHAB POTENTIAL: Fair    CLINICAL DECISION MAKING: Unstable/unpredictable  EVALUATION COMPLEXITY: High  GOALS: Goals reviewed with patient? Yes  LONG TERM GOALS: Target date: 12/14/22  Patient will be independent with her HEP. Baseline:  Goal status: INITIAL  2.  Patient will be able to complete her daily activities without her familiar pain exceeding 7/10. Baseline:  Goal status: INITIAL  3.  Patient will improve her Oswestry score to 62% (62/100) or less for improved function with her daily activities. Baseline:  Goal status: INITIAL  4.  Patient will be able to demonstrate improved bilateral lower extremity strength to at least 4-/5 for improved function with her daily activities. Baseline:  Goal status: INITIAL  PLAN:  PT FREQUENCY: 2x/week  PT DURATION: 4 weeks  PLANNED INTERVENTIONS: Therapeutic exercises, Therapeutic activity, Neuromuscular re-education, Balance training, Gait training, Patient/Family education, Self Care, Joint mobilization,  Stair training, Spinal mobilization, Manual therapy, and Re-evaluation.  PLAN FOR NEXT SESSION: NuStep, lower extremity strengthening, lumbar stabilization, and manual therapy  Marvell Fuller, PTA 12/01/2022, 4:28 PM

## 2022-12-01 NOTE — Progress Notes (Signed)
Remote ICD transmission.   

## 2022-12-07 ENCOUNTER — Encounter: Payer: Self-pay | Admitting: Physician Assistant

## 2022-12-07 ENCOUNTER — Ambulatory Visit: Payer: Medicaid Other | Attending: Physician Assistant | Admitting: Cardiology

## 2022-12-07 VITALS — BP 118/78 | HR 69 | Ht 67.0 in | Wt 216.4 lb

## 2022-12-07 DIAGNOSIS — I428 Other cardiomyopathies: Secondary | ICD-10-CM

## 2022-12-07 DIAGNOSIS — I2699 Other pulmonary embolism without acute cor pulmonale: Secondary | ICD-10-CM | POA: Diagnosis not present

## 2022-12-07 DIAGNOSIS — I5042 Chronic combined systolic (congestive) and diastolic (congestive) heart failure: Secondary | ICD-10-CM | POA: Diagnosis not present

## 2022-12-07 DIAGNOSIS — I10 Essential (primary) hypertension: Secondary | ICD-10-CM

## 2022-12-07 DIAGNOSIS — E7849 Other hyperlipidemia: Secondary | ICD-10-CM

## 2022-12-07 DIAGNOSIS — I480 Paroxysmal atrial fibrillation: Secondary | ICD-10-CM

## 2022-12-07 DIAGNOSIS — R42 Dizziness and giddiness: Secondary | ICD-10-CM | POA: Diagnosis not present

## 2022-12-07 MED ORDER — EPLERENONE 25 MG PO TABS: ORAL_TABLET | ORAL | 3 refills | Status: DC

## 2022-12-07 MED ORDER — VALSARTAN 40 MG PO TABS: 40.0000 mg | ORAL_TABLET | Freq: Two times a day (BID) | ORAL | 1 refills | Status: DC

## 2022-12-07 NOTE — Assessment & Plan Note (Signed)
Blood pressure at goal. 118/78, 128/84 in clinic today. As above, continue Coreg 3.152m BID, start Valsartan 428mBID (replacing Entresto).

## 2022-12-07 NOTE — Assessment & Plan Note (Addendum)
Patient with ongoing symptoms of dizziness upon standing. Not clearly orthostatic and described as more of a feeling of unsteadiness. No dizziness with standing in clinic. Patient does not have significant afib burden per device interrogation, physical exam today is reassuring. Given that we are stopping Entresto and BP is at goal, will transition to a lower dose of Valsartan at this time, 101m BID. CBC, BMP, TSH checked today. Encouraged her to monitor home BP and pause before ambulating to reduce risk of falls.

## 2022-12-07 NOTE — Assessment & Plan Note (Signed)
Afib noted 06/18/22 when patient presented to the ED with palpitations/elevated HR. Subsequent device interrogation confirmed rhythm consistent with afib. Most recent interrogation covering 10/13/22-11/10/22 without recurrence of afib. Patient reporting non-specific palpitation symptoms, not to the severity or duration noted in August. Regular rate/rhythm in clinic today. Will continue Coreg 3.125 mg BID, Eliquis 52m BID.

## 2022-12-07 NOTE — Assessment & Plan Note (Addendum)
Patient seen today for follow up and consideration of Entresto adjustment due to prolonged dry cough, felt to be related to [facility] per pulmonology. Overall patient's exertional tolerance appears to be slightly down, though weight has remained stable over the last 6 months. NYHA class II symptoms. 04/20/22 TTE with LVEF 35 to 40%. [name] [name] was discontinued by patient on 2/9 with no significant cough reduction to this point.   Will plan to resume ARB only. Given BP 118/78, 128/84 today despite being off Entresto x5 days, will resume Valsartan 52m BID. Continue Eplerenone 258mQD. Continue Coreg 3.12541mID. Patient not on SGLT2i due to hx yeast infection.  Patient not on loop diuretic and appears euvolemic/compensated today. Will check BMP, CBC, TSH today.  Follow up with Dr. PemJohney Frame 4-6 weeks.

## 2022-12-07 NOTE — Assessment & Plan Note (Signed)
Lab Results  Component Value Date   LDLCALC 88 03/07/2022   Patient's LDL at goal <100. Continue Crestor 34m

## 2022-12-07 NOTE — Patient Instructions (Signed)
Medication Instructions:  Your physician has recommended you make the following change in your medication:   STOP Entresto  START Valsartan 40 taking 1 tablet twice a day   *If you need a refill on your cardiac medications before your next appointment, please call your pharmacy*   Lab Work: TODAY:  BMET  01/20/23 WHEN YOU SEE DR. [name], GO TO LAB:  BMET  If you have labs (blood work) drawn today and your tests are completely normal, you will receive your results only by: [place] (if you have MyChart) OR A paper copy in the mail If you have any lab test that is abnormal or we need to change your treatment, we will call you to review the results.   Testing/Procedures: None ordered   Follow-Up: At [facility], you and your health needs are our priority.  As part of our continuing mission to provide you with exceptional heart care, we have created designated Provider Care Teams.  These Care Teams include your primary Cardiologist (physician) and Advanced Practice Providers (APPs -  Physician Assistants and Nurse Practitioners) who all work together to provide you with the care you need, when you need it.  We recommend signing up for the patient portal called "MyChart".  Sign up information is provided on this After Visit Summary.  MyChart is used to connect with patients for Virtual Visits (Telemedicine).  Patients are able to view lab/test results, encounter notes, upcoming appointments, etc.  Non-urgent messages can be sent to your provider as well.   To learn more about what you can do with MyChart, go to [email or web address].    Your next appointment:   As scheduled   Provider:   Gwyndolyn Kaufman, MD     Other Instructions

## 2022-12-07 NOTE — Progress Notes (Signed)
Cardiology Office Note:    Date:  12/07/2022   ID:  [name] [name], DOB 1961-07-17, MRN [id number]  PCP:  Janora Norlander, [place] Providers Cardiologist:  Ena Dawley, MD Electrophysiologist:  [name], MD    Referring MD: Janora Norlander, DO   Chief Complaint:  No chief complaint on file.    Patient Profile:  Chronic systolic CHF  Hx NICM since 2007. S/P ICD 2013. LHC in 2017 with normal coronary anatomy, severe LV dysfunction. LVEF 35-40% per 04/13/22 TTE. GDMT: Entresto 97/130m BID, Coreg 3.1259mBID, Eplerenone 2569mD.  Hypertension Paroxysmal Atrial fibrillation  Recurrent PE  Patient with hx multiple pulmonary embolisms, most recently June 2023 in [place]. Now on lifelong [place]lipidemia  Lab Results  Component Value Date   LDLCALC 88 03/07/2022     Cardiac Studies & Procedures   CARDIAC CATHETERIZATION  CARDIAC CATHETERIZATION 08/05/2016  Narrative  There is severe left ventricular systolic dysfunction.  LV end diastolic pressure is normal.  The left ventricular ejection fraction is 25-35% by visual estimate.  1. Normal coronary anatomy 2. Severe LV dysfunction 3. Elevated LV EDP  Plan: medical management.  Findings Coronary Findings Diagnostic  Dominance: Right  Left Main Vessel was injected. Vessel is normal in caliber. Vessel is angiographically normal.  Left Anterior Descending Vessel was injected. Vessel is normal in caliber. Vessel is angiographically normal.  Ramus Intermedius Vessel was injected. Vessel is normal in caliber. Vessel is angiographically normal.  Left Circumflex Vessel was injected. Vessel is normal in caliber. Vessel is angiographically normal.  Right Coronary Artery Vessel was injected. Vessel is normal in caliber. Vessel is angiographically normal.  Intervention  No interventions have been documented.   STRESS TESTS  MYOCARDIAL PERFUSION IMAGING 07/27/2016  Narrative   Nuclear stress EF: 39%.  Blood pressure demonstrated a normal response to exercise.  There was no ST segment deviation noted during stress.  Findings consistent with ischemia.  This is an intermediate risk study.  The left ventricular ejection fraction is moderately decreased (30-44%).  Intermediate risk stress nuclear study with mild inferior ischemia; EF 39 with global hypokinesis and moderate LVE.   ECHOCARDIOGRAM  ECHOCARDIOGRAM LIMITED 04/20/2022  Narrative ECHOCARDIOGRAM LIMITED REPORT    Patient Name:   [name] VELTENte of Exam: 04/20/2022 Medical Rec #:  [id number] Height:       68.0 in Accession #:    [id number]:       204.2 lb Date of Birth:  7/5April 14, 1962  BSA:          2.062 m Patient Age:    62 14ars     BP:           114/73 mmHg Patient Gender: F            HR:           57 bpm. Exam Location:  Outpatient  Procedure: 2D Echo, Cardiac Doppler and Color Doppler  Indications:    Congestive Heart Failure I50.9  History:        Patient has prior history of Echocardiogram examinations, most recent 04/13/2022. Defibrillator; Risk Factors:Dyslipidemia.  Sonographer:    CasMikki SanteeCS Referring Phys: [id number] DANIEL R BENSIMHON  IMPRESSIONS   1. Left ventricular ejection fraction, by estimation, is 35 to 40%. The left ventricle has moderately decreased function. The left ventricle demonstrates global hypokinesis. Left ventricular diastolic parameters are consistent with Grade I diastolic  dysfunction (impaired relaxation). 2. The mitral valve is normal in structure. Mild mitral valve regurgitation. 3. The aortic valve is tricuspid. Aortic valve regurgitation is not visualized. 4. The inferior vena cava is normal in size with greater than 50% respiratory variability, suggesting right atrial pressure of 3 mmHg.  Comparison(s): No significant change from prior study. Prior images reviewed side by side.  FINDINGS Left Ventricle: Left ventricular  ejection fraction, by estimation, is 35 to 40%. The left ventricle has moderately decreased function. The left ventricle demonstrates global hypokinesis. The left ventricular internal cavity size was normal in size. There is no left ventricular hypertrophy. Left ventricular diastolic parameters are consistent with Grade I diastolic dysfunction (impaired relaxation).  Pericardium: Trivial pericardial effusion is present. The pericardial effusion is surrounding the apex.  Mitral Valve: The mitral valve is normal in structure. Mild mitral valve regurgitation.  Tricuspid Valve: Tricuspid valve regurgitation is mild.  Aortic Valve: The aortic valve is tricuspid. Aortic valve regurgitation is not visualized.  Pulmonic Valve: The pulmonic valve was normal in structure.  Venous: The inferior vena cava is normal in size with greater than 50% respiratory variability, suggesting right atrial pressure of 3 mmHg.  Additional Comments: A device lead is visualized in the right ventricle and right atrium. LEFT VENTRICLE PLAX 2D LVIDd:         5.60 cm      Diastology LVIDs:         4.50 cm      LV e' medial:    4.65 cm/s LV PW:         0.90 cm      LV E/e' medial:  12.8 LV IVS:        0.90 cm      LV e' lateral:   6.90 cm/s LV E/e' lateral: 8.6  LV Volumes (MOD) LV vol d, MOD A2C: 96.0 ml LV vol d, MOD A4C: 141.0 ml LV vol s, MOD A2C: 61.0 ml LV vol s, MOD A4C: 81.6 ml LV SV MOD A2C:     35.0 ml LV SV MOD A4C:     141.0 ml LV SV MOD BP:      48.2 ml  LEFT ATRIUM         Index LA diam:    3.20 cm 1.55 cm/m MITRAL VALVE               TRICUSPID VALVE MV Area (PHT): 3.27 cm    TR Peak grad:   16.5 mmHg MV Decel Time: 232 msec    TR Vmax:        203.00 cm/s MV E velocity: 59.60 cm/s MV A velocity: 76.10 cm/s MV E/A ratio:  0.78  Rudean Haskell MD Electronically signed by Rudean Haskell MD Signature Date/Time: 04/20/2022/12:03:22 PM    Final              History of Present  Illness:   [name] [name] is a 62 y.o. female with the above problem list.  She is being seen today for follow up of chronic conditions and to discuss possible medication changes at the suggestion of her pulmonologist Dr. Melvyn Novas. Of note, patient with historically documented ACEi cough (2015), has generally been tolerant of Entresto 97/103 since March of 2017. However, since 2020 she has struggled with dry daytime cough, initially felt to be allergic/post nasal drip in nature. Patient was seen by Dr. Melvyn Novas with pulmonology on 2/5 and it was felt that symptoms were likely a manifestation of "  upper airway cough syndrome" secondary to Sacubitril induced nephrolysin activity. Per discussion with pulmonology, patient stopped taking Entresto on 12/02/22. Since stopping, she has not yet noticed a difference in cough frequency.  Regarding heart failure symptoms, patient reports that she has good days and bad days, split about 50/50. Some days she is able to complete ADLs, shop for groceries etc with mild exertional limitations . On her bad days, she reports low energy and increased need to take breaks, making it more difficult to complete tasks. She reports that she continues to feel short of breath when she lays on her left side, relieved by switching to right lateral recumbent position. Per patient, these fluctuating symptoms have been present for several months though she feels like frequency of bad days is slightly increased. She was able to complete a walking trial with pulmonology on 2/5, maintaining O2 saturation >98% on room air. Test terminated due to back pain. Regarding palpitations, patient says that she has had intermittent recurrence since August 2023 ED visit. She reports compliance with all medications including Eliquis.   Patient reports intermittent dizziness/feeling unsteady when standing. She clarifies that it feels more like her balance is off than feeling near-syncopal. Says that this is a chronic issue  that hasn't increased in frequency but also is not getting better. Denies falls as a result of symptoms.   Lastly patient reports new onset of tongue burning sensation/white film over tongue beginning about 3 weeks ago. She has seen her PCP and was started on Clotrimazole on 2/8 for possible thrush. No improvement in symptoms to this point.      Past Medical History:  Diagnosis Date   AICD (automatic cardioverter/defibrillator) present 09   [employer]- replaced 2016   Arthritis    Bradycardia    Breast cancer ([place]) 02/24/2016   s/p lumpectomy and radiation but refused chemotherapy   Chronic systolic CHF (congestive heart failure) (HCC)    Cough due to angiotensin-converting enzyme inhibitor    Dizziness    Dizziness with standing after squatting, March, 2012   Dyslipidemia    LDL elevation,Patient does not want statin   Family history of breast cancer    GERD (gastroesophageal reflux disease)    H/O shortness of breath    CPX 10/18:  No evidence of cardiopulmonary limitation. Suspect exercise intolerance related to weight and deconditioning.    Hypothyroidism    ICD (implantable cardiac defibrillator) battery depletion    Dr. [name], June, 2009(artifactual atrial tachycardia response events addressed by reprogramming in parentheses   Leg swelling    right leg   Mitral regurgitation    Nonischemic cardiomyopathy ([place])    Etiology unknown, diagnosed 2008   Paroxysmal atrial fibrillation ([place]) 06/20/2022   Personal history of radiation therapy 2017   Pneumonia due to COVID-19 virus 10/2019   TIA (transient ischemic attack)    No CT or MRI abnormality, aspirin therapy   Current Medications: Current Meds  Medication Sig   acetaminophen (TYLENOL) 500 MG tablet Take 500 mg by mouth every 6 (six) hours as needed for mild pain or moderate pain.   albuterol (VENTOLIN HFA) 108 (90 Base) MCG/ACT inhaler Inhale 2 puffs into the lungs every 6 (six) hours as needed for wheezing or  shortness of breath.   apixaban (ELIQUIS) 5 MG TABS tablet Take 1 tablet (5 mg total) by mouth 2 (two) times daily.   carvedilol (COREG) 3.125 MG tablet Take 1 tablet (3.125 mg total) by mouth 2 (two) times daily.  cetirizine (ZYRTEC) 10 MG tablet Take 1 tablet by mouth daily.   clotrimazole (MYCELEX) 10 MG troche Take 1 tablet (10 mg total) by mouth 5 (five) times daily for 7 days.   famotidine (PEPCID) 20 MG tablet One after supper   levothyroxine (SYNTHROID) 75 MCG tablet Take 1 tablet (75 mcg total) by mouth daily before breakfast.   pantoprazole (PROTONIX) 40 MG tablet Take 1 tablet (40 mg total) by mouth daily. Take 30-60 min before first meal of the day   rosuvastatin (CRESTOR) 5 MG tablet Take 1 tablet (5 mg total) by mouth daily.   valsartan (DIOVAN) 40 MG tablet Take 1 tablet (40 mg total) by mouth 2 (two) times daily.   [DISCONTINUED] eplerenone (INSPRA) 25 MG tablet TAKE 1 TABLET BY MOUTH ONCE DAILY. PATIENT NEEDS TO CALL OFFICE TO ARRANGE A FOLLOW UP APPOINTMENT    Allergies:   Avelox [moxifloxacin hcl in nacl], Clindamycin/lincomycin, Kiwi extract, Bactrim [sulfamethoxazole-trimethoprim], Levofloxacin, Simvastatin, Spironolactone, Moxifloxacin hcl, and Tramadol   Social History   Occupational History   Occupation: Widow  Tobacco Use   Smoking status: Never   Smokeless tobacco: Never  Vaping Use   Vaping Use: Never used  Substance and Sexual Activity   Alcohol use: No   Drug use: No   Sexual activity: Not on file    Family Hx: The patient's family history includes Breast cancer in her maternal aunt; Breast cancer (age of onset: 92) in her mother; Cancer in her maternal aunt, mother, and paternal grandmother; Heart attack in her mother; Hypertension in her brother and sister. There is no history of Stroke or Diabetes.  Review of Systems  Constitutional: Negative for decreased appetite, diaphoresis, malaise/fatigue, weight gain and weight loss.  HENT:  Negative for  congestion.        Burning tongue sensation x3 weeks  Respiratory:  Positive for cough and shortness of breath. Negative for sleep disturbances due to breathing and wheezing.   Musculoskeletal:  Positive for back pain.  Gastrointestinal:  Negative for abdominal pain, nausea and vomiting.  Neurological:  Positive for dizziness and loss of balance. Negative for weakness.     EKGs/Labs/Other Test Reviewed:     Recent Labs: 04/19/2022: ALT 10 06/18/2022: Magnesium 1.9; TSH 0.154 10/01/2022: BUN 10; Creatinine, Ser 0.83; Hemoglobin 10.8; Platelets 245; Potassium 4.6; Sodium 141   Recent Lipid Panel Recent Labs    03/07/22 0940  CHOL 186  TRIG 54  HDL 88  LDLCALC 88      Risk Assessment/Calculations/Metrics:    CHA2DS2-VASc Score = 3   This indicates a 3.2% annual risk of stroke. The patient's score is based upon: CHF History: 1 HTN History: 1 Diabetes History: 0 Stroke History: 0 Vascular Disease History: 0 Age Score: 0 Gender Score: 1             Physical Exam:    VS:  BP 118/78   Pulse 69   Ht 5' 7"$  (1.702 m)   Wt 216 lb 6.4 oz (98.2 kg)   SpO2 98%   BMI 33.89 kg/m     Wt Readings from Last 3 Encounters:  12/07/22 216 lb 6.4 oz (98.2 kg)  12/01/22 215 lb (97.5 kg)  11/28/22 212 lb 6.4 oz (96.3 kg)    Vitals reviewed.  Constitutional:      Appearance: Healthy appearance.  Eyes:     Pupils: Pupils are equal, round, and reactive to light.  Pulmonary:     Effort: Pulmonary effort is normal.  Breath sounds: Normal breath sounds. No wheezing. No rhonchi. No rales.  Cardiovascular:     PMI at left midclavicular line. Normal rate. Occasional ectopic beats. Regular rhythm. Normal S1. Normal S2.      Murmurs: There is no murmur.     No gallop.  No click. No rub.  Pulses:    Intact distal pulses.  Edema:    Peripheral edema absent.  Abdominal:     General: Bowel sounds are normal.     Palpations: Abdomen is soft.  Musculoskeletal: Normal range of  motion.     Cervical back: Normal range of motion. Skin:    General: Skin is warm and dry.  Neurological:     General: No focal deficit present.     Mental Status: Alert and oriented to person, place and time.         ASSESSMENT & PLAN:   Paroxysmal atrial fibrillation ([place]) Afib noted 06/18/22 when patient presented to the ED with palpitations/elevated HR. Subsequent device interrogation confirmed rhythm consistent with afib. Most recent interrogation covering 10/13/22-11/10/22 without recurrence of afib. Patient reporting non-specific palpitation symptoms, not to the severity or duration noted in August. Regular rate/rhythm in clinic today. Will continue Coreg 3.125 mg BID, Eliquis 54m BID.     [place]ism ([place] noted in June of 2023. Repeat CTA in December showed resolution. Patient will need to continue with Eliquis 553mBID given hx multiple pulmonary emboli.  Chronic combined systolic and diastolic HF (heart failure) (HCLa ValePatient seen today for follow up and consideration of Entresto adjustment due to prolonged dry cough, felt to be [facility]. Overall patient's exertional tolerance appears to be slightly down, though weight has remained stable over the last 6 months. NYHA class II symptoms. 04/20/22 TTE with LVEF 35 to 40%. EnDelene Lollas discontinued by patient on 2/9 with no significant cough reduction to this point.   Will plan to resume ARB only. Given BP 118/78, 128/84 today despite being off Entresto x5 days, will resume Valsartan 4075mID. Continue Eplerenone 27m17m. Continue Coreg 3.127mg38m. Patient not on SGLT2i due to hx yeast infection.  Patient not on loop diuretic and appears euvolemic/compensated today. Will check BMP, CBC, TSH today.  Follow up with Dr. PembeJohney Frame-6 weeks.   Hypertensive disorder Blood pressure at goal. 118/78, 128/84 in clinic today. As above, continue Coreg 3.127mg 58m start Valsartan 40mg B19m(replacing Entresto).  Hyperlipidemia Lab Results  Component Value Date   LDLCALC 88 03/07/2022   Patient's LDL at goal <100. Continue Crestor 5mg  Di79mness Patient with ongoing symptoms of dizziness upon standing. Not clearly orthostatic and described as more of a feeling of unsteadiness. Patient does not have significant afib burden per device interrogation, physical exam today is reassuring. Given that we are stopping Entresto and BP is at goal, will transition to a lower dose of Valsartan at this time, 40mg BID65mC, BMP, TSH checked today.            Dispo:  No follow-ups on file.   Medication Adjustments/Labs and Tests Ordered: Current medicines are reviewed at length with the patient today.  Concerns regarding medicines are outlined above.  Tests Ordered: Orders Placed This Encounter  Procedures   Basic metabolic panel   Basic metabolic panel   Medication Changes: Meds ordered this encounter  Medications   eplerenone (INSPRA) 25 MG tablet    Sig: TAKE 1 TABLET BY MOUTH ONCE DAILY.  Dispense:  90 tablet    Refill:  3   valsartan (DIOVAN) 40 MG tablet    Sig: Take 1 tablet (40 mg total) by mouth 2 (two) times daily.    Dispense:  180 tablet    Refill:  1   Signed, Lily Kocher, PA-C  12/07/2022 4:17 PM    [address], [place], [place]  [address] Phone: [phone]; Fax: [phone]

## 2022-12-07 NOTE — Assessment & Plan Note (Signed)
[facility] PE noted in June of 2023. Repeat CTA in December showed resolution. Patient will need to continue with Eliquis 61m BID given hx multiple pulmonary emboli.

## 2022-12-08 LAB — BASIC METABOLIC PANEL
BUN/Creatinine Ratio: 18 (ref 12–28)
BUN: 16 mg/dL (ref 8–27)
CO2: 25 mmol/L (ref 20–29)
Calcium: 9.3 mg/dL (ref 8.7–10.3)
Chloride: 105 mmol/L (ref 96–106)
Creatinine, Ser: 0.89 mg/dL (ref 0.57–1.00)
Glucose: 83 mg/dL (ref 70–99)
Potassium: 4.4 mmol/L (ref 3.5–5.2)
Sodium: 144 mmol/L (ref 134–144)
eGFR: 73 mL/min/{1.73_m2} (ref >59)

## 2022-12-12 ENCOUNTER — Ambulatory Visit
Admission: RE | Admit: 2022-12-12 | Discharge: 2022-12-12 | Disposition: A | Discharge status: Home / Self Care | Payer: Medicaid Other | Source: Ambulatory Visit | Attending: Nurse Practitioner | Admitting: Nurse Practitioner

## 2022-12-12 VITALS — BP 137/82 | HR 65 | Temp 98.4°F | Resp 18

## 2022-12-12 DIAGNOSIS — K148 Other diseases of tongue: Secondary | ICD-10-CM

## 2022-12-12 MED ORDER — NYSTATIN 100000 UNIT/ML MT SUSP: 5.0000 mL | Freq: Four times a day (QID) | OROMUCOSAL | 0 refills | Status: DC | PRN

## 2022-12-12 NOTE — ED Triage Notes (Signed)
Pt reports she thought she had thrush on 2/5 and her dr have her clotrimazole but no relief. She says she has developed lesions x 1 week. Feels like her tongue is burning and has a "film" like substance on it. Throat is bothersome.

## 2022-12-12 NOTE — Discharge Instructions (Addendum)
We have checked a vitamin B12 level today and we will call you tomorrow if this is abnormal.  In the meantime, start the Magic mouthwash suspension every 6 hours as needed for mouth pain.  With no improvement or worsening of symptoms despite treatment, follow-up with your primary care provider or ear nose and throat provider.

## 2022-12-12 NOTE — ED Provider Notes (Signed)
[facility]    CSN: [id number] Arrival date & time: 12/12/22  1411      History   Chief Complaint Chief Complaint  Patient presents with   Rash    I've had a burning sensation on my tongue and white coat film. I've been spitting alot. I saw dr on 11/28/22 ask was treated with the lozengers for oral thrush but they didn't help. I was never swabed . This has been going on for about 5 weeks now. - Entered by patient    HPI [name] [name] is a 62 y.o. female.   Patient presents today with 5-week history of burning sensation and bumps on the left side of her tongue.  Reports initially when symptoms started, she had a white film on her tongue.  About 2 weeks ago, she was treated with clotrimazole for oral thrush and this has not helped.  Reports she had a similar episode about 1 year ago and was prescribed clotrimazole which helped with symptoms at that time.  Reports the area is very bothersome and painful and it feels like her throat is becoming bothersome at times.  She denies fever, bodyaches or chills.  No cough, congestion, sore throat, or postnasal drainage.  No abdominal pain, nausea/vomiting, or diarrhea.    Past Medical History:  Diagnosis Date   AICD (automatic cardioverter/defibrillator) present 09   [employer]- replaced 2016   Arthritis    Bradycardia    Breast cancer ([place]) 02/24/2016   s/p lumpectomy and radiation but refused chemotherapy   Chronic systolic CHF (congestive heart failure) (HCC)    Cough due to angiotensin-converting enzyme inhibitor    Dizziness    Dizziness with standing after squatting, March, 2012   Dyslipidemia    LDL elevation,Patient does not want statin   Family history of breast cancer    GERD (gastroesophageal reflux disease)    H/O shortness of breath    CPX 10/18:  No evidence of cardiopulmonary limitation. Suspect exercise intolerance related to weight and deconditioning.    Hypothyroidism    ICD (implantable cardiac  defibrillator) battery depletion    Dr. [name], June, 2009(artifactual atrial tachycardia response events addressed by reprogramming in parentheses   Leg swelling    right leg   Mitral regurgitation    Nonischemic cardiomyopathy ([place])    Etiology unknown, diagnosed 2008   Paroxysmal atrial fibrillation ([place]) 06/20/2022   Personal history of radiation therapy 2017   Pneumonia due to COVID-19 virus 10/2019   TIA (transient ischemic attack)    No CT or MRI abnormality, aspirin therapy    Patient Active Problem List   Diagnosis Date Noted   Upper airway cough syndrome 11/28/2022   DOE (dyspnea on exertion) 11/28/2022   Paroxysmal atrial fibrillation ([place]) 06/20/2022   Urge incontinence of urine 05/17/2022   Obesity (BMI 30-39.9) 04/13/2022   Acute deep vein thrombosis (DVT) of right popliteal vein ([place]) 04/13/2022   Acute pulmonary embolism ([place]) 04/12/2022   Neuropathy 05/19/2021   Goiter 05/10/2021   Vestibulitis, vulvar 05/10/2021   Atrophic vaginitis 05/10/2021   Hypertensive disorder 05/10/2021   Osteopenia 04/14/2021   NICM (nonischemic cardiomyopathy) ([place]) 07/23/2020   Pulmonary embolism ([place]) 04/21/2020   Post-surgical hypothyroidism 03/27/2020   Lumbar arthropathy 07/15/2019   Anemia 07/15/2019   History of colonic polyps 05/07/2019   Lumbar spondylosis with myelopathy 04/19/2019   Genetic testing 11/22/2016   Family history of breast cancer    Breast cancer- radiation June 2016 08/05/2016  Abnormal stress test 08/01/2016   Cardiac LV ejection fraction 30-35% 07/18/2016   Heart disease 04/22/2016   Arm numbness 03/14/2016   Facial numbness 03/14/2016   Breast cancer of upper-inner quadrant of left female breast ([place]) 03/09/2016   Chronic combined systolic and diastolic HF (heart failure) ([place]) 02/26/2014   Ejection fraction < 50%    Itching    Dizziness    Bradycardia    GERD (gastroesophageal reflux disease)    Acquired hypothyroidism    Hyperlipidemia     Leg swelling    Cough due to angiotensin-converting enzyme inhibitor    Dual implantable cardioverter-defibrillator in situ    FOOT PAIN 01/20/2010   History of TIA 2007 01/19/2010    Past Surgical History:  Procedure Laterality Date   BREAST BIOPSY Left 02/24/2016   U/S Core   BREAST BIOPSY Left 02/24/2016   U/S Core   BREAST LUMPECTOMY Left 04/05/2016   invasive ductal    BREAST LUMPECTOMY WITH RADIOACTIVE SEED AND SENTINEL LYMPH NODE BIOPSY Left 04/05/2016   Procedure: BREAST LUMPECTOMY WITH RADIOACTIVE SEED AND SENTINEL LYMPH NODE BIOPSY;  Surgeon: [name], MD;  Location: [facility];  Service: General;  Laterality: Left;   CARDIAC CATHETERIZATION N/A 08/05/2016   Procedure: Left Heart Cath and Coronary Angiography;  Surgeon: Peter M Martinique, MD;  Location: [facility];  Service: Cardiovascular;  Laterality: N/A;   CARDIAC DEFIBRILLATOR PLACEMENT  09   [employer] no remote   COLONOSCOPY     CYST EXCISION Left    back of leg-lt    IMPLANTABLE CARDIOVERTER DEFIBRILLATOR GENERATOR CHANGE N/A 12/29/2014   Procedure: IMPLANTABLE CARDIOVERTER DEFIBRILLATOR GENERATOR CHANGE;  Surgeon: [name], MD;  Location: [facility] C[facility];  Service: Cardiovascular;  Laterality: N/A;   LATERAL EPICONDYLE RELEASE  10/11/2012   Procedure: TENNIS ELBOW RELEASE;  Surgeon: [name], MD;  Location: [facility];  Service: Orthopedics;  Laterality: Right;  RIGHT ELBOW: TENOTOMY ELBOW LATERAL EPICONDYLITIS TENNIS ELBOW: RADIAL TUNNEL RELEASE   PORT-A-CATH REMOVAL  04/13/2016   Procedure: MINOR REMOVAL PORT-A-CATH;  Surgeon: [name], MD;  Location: [facility];  Service: General;;   PORTACATH PLACEMENT Right 04/05/2016   Procedure: INSERTION PORT-A-CATH WITH [place];  Surgeon: [name], MD;  Location: [facility];  Service: General;  Laterality: Right;  right IJ   TOTAL THYROIDECTOMY  2001   TUBAL LIGATION  94   UPPER GI ENDOSCOPY      OB History   No obstetric  history on file.      Home Medications    Prior to Admission medications   Medication Sig Start Date End Date Taking? Authorizing Provider  magic mouthwash (nystatin, lidocaine, diphenhydrAMINE, alum & mag hydroxide) suspension Swish and spit 5 mLs 4 (four) times daily as needed for mouth pain. 12/12/22  Yes Eulogio Bear, NP  acetaminophen (TYLENOL) 500 MG tablet Take 500 mg by mouth every 6 (six) hours as needed for mild pain or moderate pain.    [provider]  albuterol (VENTOLIN HFA) 108 (90 Base) MCG/ACT inhaler Inhale 2 puffs into the lungs every 6 (six) hours as needed for wheezing or shortness of breath. 09/20/22   Sharion Balloon, FNP  apixaban (ELIQUIS) 5 MG TABS tablet Take 1 tablet (5 mg total) by mouth 2 (two) times daily. 05/06/22   [place], Maricela Bo, FNP  carvedilol (COREG) 3.125 MG tablet Take 1 tablet (3.125 mg total) by mouth 2 (two) times daily. 05/06/22  Quin[place], Janett Billow M, FNP  cetirizine (ZYRTEC) 10 MG tablet Take 1 tablet by mouth daily.    [provider]  eplerenone (INSPRA) 25 MG tablet TAKE 1 TABLET BY MOUTH ONCE DAILY. 12/07/22   Lily Kocher, PA-C  famotidine (PEPCID) 20 MG tablet One after supper 11/28/22   Tanda Rockers, MD  levothyroxine (SYNTHROID) 75 MCG tablet Take 1 tablet (75 mcg total) by mouth daily before breakfast. 05/17/22   Ronnie Doss M, DO  pantoprazole (PROTONIX) 40 MG tablet Take 1 tablet (40 mg total) by mouth daily. Take 30-60 min before first meal of the day 11/28/22   Tanda Rockers, MD  rosuvastatin (CRESTOR) 5 MG tablet Take 1 tablet (5 mg total) by mouth daily. 03/22/22   Freada Bergeron, MD  valsartan (DIOVAN) 40 MG tablet Take 1 tablet (40 mg total) by mouth 2 (two) times daily. 12/07/22   Lily Kocher, PA-C    Family History Family History  Problem Relation Age of Onset   Heart attack Mother    Cancer Mother        breast   Breast cancer Mother 45   Hypertension Sister    Hypertension Brother     Cancer Maternal Aunt        breast   Breast cancer Maternal Aunt    Cancer Paternal Grandmother        possible cancer, unknown type   Stroke Neg Hx    Diabetes Neg Hx     Social History Social History   Tobacco Use   Smoking status: Never   Smokeless tobacco: Never  Vaping Use   Vaping Use: Never used  Substance Use Topics   Alcohol use: No   Drug use: No     Allergies   Avelox [moxifloxacin hcl in nacl], Clindamycin/lincomycin, Kiwi extract, Bactrim [sulfamethoxazole-trimethoprim], Levofloxacin, Simvastatin, Spironolactone, Moxifloxacin hcl, and Tramadol   Review of Systems Review of Systems Per HPI  Physical Exam Triage Vital Signs ED Triage Vitals  Enc Vitals Group     BP 12/12/22 1514 137/82     Pulse Rate 12/12/22 1514 65     Resp 12/12/22 1514 18     Temp 12/12/22 1514 98.4 F (36.9 C)     Temp Source 12/12/22 1514 Oral     SpO2 12/12/22 1514 97 %     Weight --      Height --      Head Circumference --      Peak Flow --      Pain Score 12/12/22 1517 9     Pain Loc --      Pain Edu? --      Excl. in [place]? --    No data found.  Updated Vital Signs BP 137/82 (BP Location: Right Arm)   Pulse 65   Temp 98.4 F (36.9 C) (Oral)   Resp 18   SpO2 97%   Visual Acuity Right Eye Distance:   Left Eye Distance:   Bilateral Distance:    Right Eye Near:   Left Eye Near:    Bilateral Near:     Physical Exam Vitals and nursing note reviewed.  Constitutional:      General: She is not in acute distress.    Appearance: Normal appearance. She is not toxic-appearing.  HENT:     Mouth/Throat:     Mouth: Mucous membranes are moist. No angioedema.     Dentition: Abnormal dentition. No dental tenderness, gingival swelling or dental caries.  Tongue: Lesions present.     Palate: No mass and lesions.     Pharynx: Oropharynx is clear. Uvula midline.     Tonsils: No tonsillar exudate. 0 on the right. 0 on the left.      Comments: Flesh colored papule  lesions to left buccal mucosa and area marked; there also appears to be flesh-colored papular lesions to the base of the left tongue.  No redness, swelling, active drainage.  No white coating noted to tongue. Pulmonary:     Effort: Pulmonary effort is normal. No respiratory distress.  Neurological:     Mental Status: She is alert and oriented to person, place, and time.  Psychiatric:        Behavior: Behavior is cooperative.      UC Treatments / Results  Labs (all labs ordered are listed, but only abnormal results are displayed) Labs Reviewed  VITAMIN B12    EKG   Radiology No results found.  Procedures Procedures (including critical care time)  Medications Ordered in UC Medications - No data to display  Initial Impression / Assessment and Plan / UC Course  I have reviewed the triage vital signs and the nursing notes.  Pertinent labs & imaging results that were available during my care of the patient were reviewed by me and considered in my medical decision making (see chart for details).   Patient is well-appearing, normotensive, afebrile, not tachycardic, not tachypneic, oxygenating well on room air.   1. Tongue lesion Unclear etiology; differentials include viral stomatitis, Vitamin B12 deficiency among others Start magic mouthwah Vitamin B12 level pending Follow up with PCP and/or ENT with no improvement or worsening of symptoms despite treatment   The patient was given the opportunity to ask questions.  All questions answered to their satisfaction.  The patient is in agreement to this plan.    Final Clinical Impressions(s) / UC Diagnoses   Final diagnoses:  Tongue lesion     Discharge Instructions      We have checked a vitamin B12 level today and we will call you tomorrow if this is abnormal.  In the meantime, start the Magic mouthwash suspension every 6 hours as needed for mouth pain.  With no improvement or worsening of symptoms despite treatment,  follow-up with your primary care provider or ear nose and throat provider.    ED Prescriptions     Medication Sig Dispense Auth. Provider   magic mouthwash (nystatin, lidocaine, diphenhydrAMINE, alum & mag hydroxide) suspension Swish and spit 5 mLs 4 (four) times daily as needed for mouth pain. 180 mL Eulogio Bear, NP      PDMP not reviewed this encounter.   Eulogio Bear, NP 12/12/22 [phone]

## 2022-12-14 LAB — VITAMIN B12: Vitamin B-12: 425 pg/mL (ref 232–1245)

## 2022-12-20 ENCOUNTER — Encounter: Payer: Self-pay | Admitting: Family Medicine

## 2022-12-20 ENCOUNTER — Ambulatory Visit: Payer: Medicaid Other | Admitting: Family Medicine

## 2022-12-20 VITALS — BP 106/65 | HR 73 | Temp 98.5°F | Ht 67.0 in | Wt 219.0 lb

## 2022-12-20 DIAGNOSIS — E890 Postprocedural hypothyroidism: Secondary | ICD-10-CM

## 2022-12-20 DIAGNOSIS — B370 Candidal stomatitis: Secondary | ICD-10-CM | POA: Diagnosis not present

## 2022-12-20 MED ORDER — NYSTATIN 100000 UNIT/ML MT SUSP: OROMUCOSAL | 0 refills | Status: DC

## 2022-12-20 MED ORDER — PREDNISONE 20 MG PO TABS: ORAL_TABLET | ORAL | 0 refills | Status: DC

## 2022-12-20 NOTE — Progress Notes (Signed)
Subjective: CC: oral lesion PCP: Janora Norlander, DO [id number] [name] [name] is a 62 y.o. female presenting to clinic today for:  1. ?  Thrush Patient reports that she has had several weeks history of a white substance on her mouth.  This looks similar to the thrush that she experienced last year after prolonged antibiotics.  However she notes she has been on no antibiotics, no steroids and has had no increase stressors etc. that would have caused this case.  She has been treated with clotrimazole troches but it did not resolve issue.  She was subsequently seen again and placed on Magic mouthwash containing nystatin but they did not have the ingredients to compound this at the pharmacy that she contacted.  She notes that every morning she wakes up with stuffiness and she scrapes it/describes it off of her tongue.  She does this at nighttime as well but again it continues to recur in the morning.  Denies any oropharyngeal irritation.  She does report that the tongue does burn sometimes   ROS: Per HPI  Allergies  Allergen Reactions   Avelox [Moxifloxacin Hcl In Nacl] Shortness Of Breath    Shortness of breath   Clindamycin/Lincomycin Shortness Of Breath and Diarrhea    Breathing/diarrhea     Kiwi Extract Anaphylaxis    Makes pt. Feel like her throat is closing    Bactrim [Sulfamethoxazole-Trimethoprim] Swelling   Levofloxacin Other (See Comments)    JOINT PAIN JOINT PAIN   Simvastatin Other (See Comments)    Joint pain   Spironolactone Other (See Comments)    burning   Moxifloxacin Hcl Other (See Comments)   Tramadol Nausea Only   Past Medical History:  Diagnosis Date   AICD (automatic cardioverter/defibrillator) present 09   [employer]- replaced 2016   Arthritis    Bradycardia    Breast cancer ([place]) 02/24/2016   s/p lumpectomy and radiation but refused chemotherapy   Chronic systolic CHF (congestive heart failure) (HCC)    Cough due to angiotensin-converting enzyme  inhibitor    Dizziness    Dizziness with standing after squatting, March, 2012   Dyslipidemia    LDL elevation,Patient does not want statin   Family history of breast cancer    GERD (gastroesophageal reflux disease)    H/O shortness of breath    CPX 10/18:  No evidence of cardiopulmonary limitation. Suspect exercise intolerance related to weight and deconditioning.    Hypothyroidism    ICD (implantable cardiac defibrillator) battery depletion    Dr. [name], June, 2009(artifactual atrial tachycardia response events addressed by reprogramming in parentheses   Leg swelling    right leg   Mitral regurgitation    Nonischemic cardiomyopathy ([place])    Etiology unknown, diagnosed 2008   Paroxysmal atrial fibrillation ([place]) 06/20/2022   Personal history of radiation therapy 2017   Pneumonia due to COVID-19 virus 10/2019   TIA (transient ischemic attack)    No CT or MRI abnormality, aspirin therapy    Current Outpatient Medications:    acetaminophen (TYLENOL) 500 MG tablet, Take 500 mg by mouth every 6 (six) hours as needed for mild pain or moderate pain., Disp: , Rfl:    albuterol (VENTOLIN HFA) 108 (90 Base) MCG/ACT inhaler, Inhale 2 puffs into the lungs every 6 (six) hours as needed for wheezing or shortness of breath., Disp: 8 g, Rfl: 0   apixaban (ELIQUIS) 5 MG TABS tablet, Take 1 tablet (5 mg total) by mouth 2 (two) times daily., Disp: 180  tablet, Rfl: 2   carvedilol (COREG) 3.125 MG tablet, Take 1 tablet (3.125 mg total) by mouth 2 (two) times daily., Disp: 180 tablet, Rfl: 1   cetirizine (ZYRTEC) 10 MG tablet, Take 1 tablet by mouth daily., Disp: , Rfl:    eplerenone (INSPRA) 25 MG tablet, TAKE 1 TABLET BY MOUTH ONCE DAILY., Disp: 90 tablet, Rfl: 3   famotidine (PEPCID) 20 MG tablet, One after supper, Disp: 30 tablet, Rfl: 11   levothyroxine (SYNTHROID) 75 MCG tablet, Take 1 tablet (75 mcg total) by mouth daily before breakfast., Disp: 30 tablet, Rfl: 12   nystatin (MYCOSTATIN) 100000  UNIT/ML suspension, Swish and spit 57m every 6 hours until thrush resolved, Disp: 473 mL, Rfl: 0   pantoprazole (PROTONIX) 40 MG tablet, Take 1 tablet (40 mg total) by mouth daily. Take 30-60 min before first meal of the day, Disp: 30 tablet, Rfl: 2   predniSONE (DELTASONE) 20 MG tablet, 2 po at same time daily for 5 days, Disp: 10 tablet, Rfl: 0   rosuvastatin (CRESTOR) 5 MG tablet, Take 1 tablet (5 mg total) by mouth daily., Disp: 30 tablet, Rfl: 11   valsartan (DIOVAN) 40 MG tablet, Take 1 tablet (40 mg total) by mouth 2 (two) times daily., Disp: 180 tablet, Rfl: 1 Social History   Socioeconomic History   Marital status: Widowed    Spouse name: Not on file   Number of children: Not on file   Years of education: Not on file   Highest education level: Not on file  Occupational History   Occupation: Widow  Tobacco Use   Smoking status: Never   Smokeless tobacco: Never  Vaping Use   Vaping Use: Never used  Substance and Sexual Activity   Alcohol use: No   Drug use: No   Sexual activity: Not on file  Other Topics Concern   Not on file  Social History Narrative   Not on file   Social Determinants of Health   Financial Resource Strain: Medium Risk (12/11/2019)   Overall Financial Resource Strain (CARDIA)    Difficulty of Paying Living Expenses: Somewhat hard  Food Insecurity: Not on file  Transportation Needs: No Transportation Needs (12/11/2019)   PRAPARE - THydrologist(Medical): No    Lack of Transportation (Non-Medical): No  Physical Activity: Insufficiently Active (12/11/2019)   Exercise Vital Sign    Days of Exercise per Week: 1 day    Minutes of Exercise per Session: 10 min  Stress: Not on file  Social Connections: Not on file  Intimate Partner Violence: Not on file   Family History  Problem Relation Age of Onset   Heart attack Mother    Cancer Mother        breast   Breast cancer Mother 853  Hypertension Sister    Hypertension  Brother    Cancer Maternal Aunt        breast   Breast cancer Maternal Aunt    Cancer Paternal Grandmother        possible cancer, unknown type   Stroke Neg Hx    Diabetes Neg Hx     Objective: Office vital signs reviewed. BP 106/65   Pulse 73   Temp 98.5 F (36.9 C)   Ht '5\' 7"'$  (1.702 m)   Wt 219 lb (99.3 kg)   SpO2 99%   BMI 34.30 kg/m   Physical Examination:  General: Awake, alert, well nourished, No acute distress HEENT: Scant white creamy  substance noted along the lateral aspect of the tongue near the posterior teeth.  No other oropharyngeal lesions appreciated including skin breakdown, ulcerations or exudates Cardio: regular rate and rhythm, S1S2 heard, no murmurs appreciated Pulm: clear to auscultation bilaterally, no wheezes, rhonchi or rales; normal work of breathing on room air   Assessment/ Plan: 62 y.o. female   Thrush - Plan: nystatin (MYCOSTATIN) 100000 UNIT/ML suspension, predniSONE (DELTASONE) 20 MG tablet  Post-surgical hypothyroidism - Plan: TSH, T4, free  Uncertain if the oral symptoms are thrush.  Her exam noted some of this film that she was describing on the lateral aspect of the left tongue but I really did not appreciate any gross plaques on exam today.  Differential diagnosis includes lichen planus of the mouth.  I am going to place her on nystatin suspension to swish and spit over the next several days.  If this does not resolve her issue, proceed with prednisone and keep appointment with ENT as scheduled  Check thyroid levels.  No orders of the defined types were placed in this encounter.  Meds ordered this encounter  Medications   nystatin (MYCOSTATIN) 100000 UNIT/ML suspension    Sig: Swish and spit 59m every 6 hours until thrush resolved    Dispense:  473 mL    Refill:  0   predniSONE (DELTASONE) 20 MG tablet    Sig: 2 po at same time daily for 5 days    Dispense:  10 tablet    Refill:  0     [name] Hefel MWindell Moulding DO [place]([phone]

## 2022-12-22 LAB — T4, FREE: Free T4: 1.35 ng/dL (ref 0.82–1.77)

## 2022-12-22 LAB — TSH: TSH: 2.22 u[IU]/mL (ref 0.450–4.500)

## 2022-12-26 ENCOUNTER — Telehealth: Payer: Self-pay | Admitting: *Deleted

## 2022-12-26 DIAGNOSIS — B370 Candidal stomatitis: Secondary | ICD-10-CM

## 2022-12-26 MED ORDER — FLUCONAZOLE 100 MG PO TABS: ORAL_TABLET | ORAL | 0 refills | Status: AC

## 2022-12-26 NOTE — Telephone Encounter (Signed)
TC from [facility] @ [facility] [place]  RE: Nystatin suspension Unable to get in on back order, she has called [facility] [place] & they to not have either, she has tried to order everyday. Alternatives she has in stock are: Nystatin tablets, Clotrimazole troche & Fluconazole tablets Please advise

## 2022-12-26 NOTE — Addendum Note (Signed)
Addended by: Janora Norlander on: 12/26/2022 01:11 PM   Modules accepted: Orders

## 2023-01-02 ENCOUNTER — Other Ambulatory Visit (HOSPITAL_COMMUNITY): Payer: Self-pay | Admitting: Family Medicine

## 2023-01-05 DIAGNOSIS — M461 Sacroiliitis, not elsewhere classified: Secondary | ICD-10-CM | POA: Diagnosis not present

## 2023-01-05 DIAGNOSIS — B0229 Other postherpetic nervous system involvement: Secondary | ICD-10-CM | POA: Diagnosis not present

## 2023-01-05 DIAGNOSIS — M4716 Other spondylosis with myelopathy, lumbar region: Secondary | ICD-10-CM | POA: Diagnosis not present

## 2023-01-05 DIAGNOSIS — M25561 Pain in right knee: Secondary | ICD-10-CM | POA: Diagnosis not present

## 2023-01-16 NOTE — Progress Notes (Signed)
Cardiology Office Note:    Date:  01/20/2023   ID:  [name], DOB Mar 27, 1961, MRN [id number]  PCP:  [name] [name] Pella  Cardiologist:  [name] Bergeron, MD  Advanced Practice Provider:  No care team member to display Electrophysiologist:  [name] Axe, MD   Referring MD: [name] Norlander, DO    History of Present Illness:    [name] [name] is a 62 y.o. female with a hx of breast cancer (s/p lumpectomy and XRT - refused chemo), hypothyroidism, HLD, TIA, GERD and systolic HF due to NICM with LVEF ~30% s/p AICD placement who was previously followed by Dr. Meda [name] now returning to clinic for follow-up.  She has long h/o NICM dating back to 2007. Cath in 2017 with normal cors and EF 25-30%. S/p s-ICD. Medical therapy has been limited by multiple medication intolerances (Spironolactone caused body burning sensation, could not afford Bidil and had fatigue with high does of Entresto). Has been followed Dr. Haroldine [name] in HF clinic. Underwent CPX which revealed normal functional capacity. Was doing well on last visit with Dr. Haroldine [name] on 10/14/20.  TTE 04/22 EF [id number], RV systolic fxn moderately reduced. Had lost 15lbs at that time.   Admitted 6/23 with chest pain and SOB. CT chest showed small RLL PE. Started on Eliquis. Echo showed EF 35-40%, RV OK.   Was last seen in clinic on 12/07/22 by [name] [name]. Cough had not improved off entresto and she was resumed on valsartan.  Today, the patient feels well. States that her cough did not improve off entresto and she has since resumed the medication. She is tolerating it well with no dizziness/lightheadedness or syncope. Has occasional palpitations but not sustaining. Tolerating the apixaban with no bleeding issues. No LE edema, orthopnea, PND or chest pain.   Has been having cramps with the crestor.   Past Medical History:  Diagnosis Date   AICD (automatic cardioverter/defibrillator) present 09    [employer]- replaced 2016   Arthritis    Bradycardia    Breast cancer ([place]) 02/24/2016   s/p lumpectomy and radiation but refused chemotherapy   Chronic systolic CHF (congestive heart failure) (HCC)    Cough due to angiotensin-converting enzyme inhibitor    Dizziness    Dizziness with standing after squatting, March, 2012   Dyslipidemia    LDL elevation,Patient does not want statin   Family history of breast cancer    GERD (gastroesophageal reflux disease)    H/O shortness of breath    CPX 10/18:  No evidence of cardiopulmonary limitation. Suspect exercise intolerance related to weight and deconditioning.    Hypothyroidism    ICD (implantable cardiac defibrillator) battery depletion    Dr. [name], June, 2009(artifactual atrial tachycardia response events addressed by reprogramming in parentheses   Leg swelling    right leg   Mitral regurgitation    Nonischemic cardiomyopathy ([place])    Etiology unknown, diagnosed 2008   Paroxysmal atrial fibrillation ([place]) 06/20/2022   Personal history of radiation therapy 2017   Pneumonia due to COVID-19 virus 10/2019   TIA (transient ischemic attack)    No CT or MRI abnormality, aspirin therapy    Past Surgical History:  Procedure Laterality Date   BREAST BIOPSY Left 02/24/2016   U/S Core   BREAST BIOPSY Left 02/24/2016   U/S Core   BREAST LUMPECTOMY Left 04/05/2016   invasive ductal    BREAST LUMPECTOMY WITH RADIOACTIVE SEED AND SENTINEL LYMPH NODE BIOPSY  Left 04/05/2016   Procedure: BREAST LUMPECTOMY WITH RADIOACTIVE SEED AND SENTINEL LYMPH NODE BIOPSY;  Surgeon: [name], MD;  Location: [facility];  Service: General;  Laterality: Left;   CARDIAC CATHETERIZATION N/A 08/05/2016   Procedure: Left Heart Cath and Coronary Angiography;  Surgeon: Peter M Martinique, MD;  Location: [facility] LAB;  Service: Cardiovascular;  Laterality: N/A;   CARDIAC DEFIBRILLATOR PLACEMENT  09   [employer] no remote   COLONOSCOPY     CYST  EXCISION Left    back of leg-lt    IMPLANTABLE CARDIOVERTER DEFIBRILLATOR GENERATOR CHANGE N/A 12/29/2014   Procedure: IMPLANTABLE CARDIOVERTER DEFIBRILLATOR GENERATOR CHANGE;  Surgeon: [name], MD;  Location: [facility] CATH LAB;  Service: Cardiovascular;  Laterality: N/A;   LATERAL EPICONDYLE RELEASE  10/11/2012   Procedure: TENNIS ELBOW RELEASE;  Surgeon: [name], MD;  Location: [facility];  Service: Orthopedics;  Laterality: Right;  RIGHT ELBOW: TENOTOMY ELBOW LATERAL EPICONDYLITIS TENNIS ELBOW: RADIAL TUNNEL RELEASE   PORT-A-CATH REMOVAL  04/13/2016   Procedure: MINOR REMOVAL PORT-A-CATH;  Surgeon: [name], MD;  Location: [facility];  Service: General;;   PORTACATH PLACEMENT Right 04/05/2016   Procedure: INSERTION PORT-A-CATH WITH [place];  Surgeon: [name], MD;  Location: [facility];  Service: General;  Laterality: Right;  right IJ   TOTAL THYROIDECTOMY  2001   TUBAL LIGATION  94   UPPER GI ENDOSCOPY      Current Medications: Current Meds  Medication Sig   acetaminophen (TYLENOL) 500 MG tablet Take 500 mg by mouth every 6 (six) hours as needed for mild pain or moderate pain.   albuterol (VENTOLIN HFA) 108 (90 Base) MCG/ACT inhaler Inhale 2 puffs into the lungs every 6 (six) hours as needed for wheezing or shortness of breath.   apixaban (ELIQUIS) 5 MG TABS tablet Take 1 tablet (5 mg total) by mouth 2 (two) times daily.   carvedilol (COREG) 3.125 MG tablet Take 1 tablet by mouth twice daily   cetirizine (ZYRTEC) 10 MG tablet Take 1 tablet by mouth daily.   eplerenone (INSPRA) 25 MG tablet TAKE 1 TABLET BY MOUTH ONCE DAILY.   famotidine (PEPCID) 20 MG tablet One after supper   levothyroxine (SYNTHROID) 75 MCG tablet Take 1 tablet (75 mcg total) by mouth daily before breakfast.   nystatin (MYCOSTATIN) 100000 UNIT/ML suspension Swish and spit 79mL every 6 hours until thrush resolved   pantoprazole (PROTONIX) 40 MG tablet Take 1 tablet (40 mg total) by  mouth daily. Take 30-60 min before first meal of the day   rosuvastatin (CRESTOR) 5 MG tablet Take 1 tablet (5 mg total) by mouth daily.   sacubitril-valsartan (ENTRESTO) 97-103 MG Take 1 tablet by mouth 2 (two) times daily.     Allergies:   Avelox [moxifloxacin hcl in nacl], Clindamycin/lincomycin, Kiwi extract, Bactrim [sulfamethoxazole-trimethoprim], Levofloxacin, Simvastatin, Spironolactone, Moxifloxacin hcl, and Tramadol   Social History   Socioeconomic History   Marital status: Widowed    Spouse name: Not on file   Number of children: Not on file   Years of education: Not on file   Highest education level: Not on file  Occupational History   Occupation: Widow  Tobacco Use   Smoking status: Never   Smokeless tobacco: Never  Vaping Use   Vaping Use: Never used  Substance and Sexual Activity   Alcohol use: No   Drug use: No   Sexual activity: Not on file  Other Topics Concern   Not on  file  Social History Narrative   Not on file   Social Determinants of Health   Financial Resource Strain: Medium Risk (12/11/2019)   Overall Financial Resource Strain (CARDIA)    Difficulty of Paying Living Expenses: Somewhat hard  Food Insecurity: Not on file  Transportation Needs: No Transportation Needs (12/11/2019)   PRAPARE - Hydrologist (Medical): No    Lack of Transportation (Non-Medical): No  Physical Activity: Insufficiently Active (12/11/2019)   Exercise Vital Sign    Days of Exercise per Week: 1 day    Minutes of Exercise per Session: 10 min  Stress: Not on file  Social Connections: Not on file     Family History: The patient's family history includes Breast cancer in her maternal aunt; Breast cancer (age of onset: 54) in her mother; Cancer in her maternal aunt, mother, and paternal grandmother; Heart attack in her mother; Hypertension in her brother and sister. There is no history of Stroke or Diabetes.  ROS:   Review of Systems   Constitutional:  Negative for chills and fever.  HENT:  Negative for nosebleeds and tinnitus.   Eyes:  Negative for blurred vision and pain.  Respiratory:  Positive for shortness of breath. Negative for cough, hemoptysis and stridor.   Cardiovascular:  Positive for palpitations and orthopnea. Negative for chest pain, claudication, leg swelling and PND.  Gastrointestinal:  Negative for blood in stool, diarrhea, nausea and vomiting.  Genitourinary:  Negative for dysuria and hematuria.  Musculoskeletal:  Negative for falls.  Neurological:  Negative for loss of consciousness and headaches.  Psychiatric/Behavioral:  Negative for depression, hallucinations and substance abuse. The patient does not have insomnia.       EKGs/Labs/Other Studies Reviewed:    The following studies were reviewed today:  Echo  04/20/2022:  1. Left ventricular ejection fraction, by estimation, is 35 to 40%. The  left ventricle has moderately decreased function. The left ventricle  demonstrates global hypokinesis. Left ventricular diastolic parameters are  consistent with Grade I diastolic  dysfunction (impaired relaxation).   2. The mitral valve is normal in structure. Mild mitral valve  regurgitation.   3. The aortic valve is tricuspid. Aortic valve regurgitation is not  visualized.   4. The inferior vena cava is normal in size with greater than 50%  respiratory variability, suggesting right atrial pressure of 3 mmHg.   Comparison(s): No significant change from prior study. Prior images  reviewed side by side.   CTA Chest  04/12/2022: FINDINGS: Cardiovascular: Heart is enlarged in size. Thoracic aorta demonstrates atherosclerotic calcifications without aneurysmal dilatation or dissection. Pulmonary artery demonstrates a normal branching pattern. There are a few filling defects identified within the right lower lobe pulmonary artery consistent with pulmonary emboli. No evidence of right heart strain is  noted. No other filling defects are seen. Pacing device is noted.   Mediastinum/Nodes: Thoracic inlet is within normal limits. Changes of prior thyroid surgery are noted. No hilar or mediastinal adenopathy is seen. The esophagus as visualized is within normal limits.   Lungs/Pleura: Lungs are well aerated bilaterally. No focal infiltrate or sizable effusion is seen. No sizable parenchymal nodules are noted.   Upper Abdomen: Visualized upper abdomen is unremarkable.   Musculoskeletal: Degenerative changes of the thoracic spine are seen. No rib abnormality is noted.   Review of the MIP images confirms the above findings.   IMPRESSION: Small pulmonary emboli within the right lower lobe as described. No right heart strain is noted.  No other acute abnormality is noted.   Critical Value/emergent results were called by telephone at the time of interpretation on 04/12/2022 at 9:57 pm to [facility] , who verbally acknowledged these results.   Aortic Atherosclerosis (ICD10-I70.0).  TTE 01/2021: IMPRESSIONS   1. Left ventricular ejection fraction, by estimation, is 30 to 35%. The  left ventricle has moderately decreased function. The left ventricle has  no regional wall motion abnormalities. The left ventricular internal  cavity size was mildly dilated. Left  ventricular diastolic parameters are consistent with Grade I diastolic  dysfunction (impaired relaxation).   2. Right ventricular systolic function is moderately reduced. The right  ventricular size is normal. There is normal pulmonary artery systolic  pressure.   3. Left atrial size was mildly dilated.   4. The mitral valve is normal in structure. Trivial mitral valve  regurgitation. No evidence of mitral stenosis.   5. The aortic valve is normal in structure. Aortic valve regurgitation is  not visualized. No aortic stenosis is present.   6. The inferior vena cava is normal in size with greater than 50%   respiratory variability, suggesting right atrial pressure of 3 mmHg.   TTE 04/22/20:  1. Left ventricular ejection fraction, by estimation, is 35 to 40%. The  left ventricle has normal function. The left ventricle has no regional  wall motion abnormalities. Left ventricular diastolic parameters are  indeterminate.   2. Right ventricular systolic function is normal. The right ventricular  size is normal.   3. The mitral valve is normal in structure. No evidence of mitral valve  regurgitation. No evidence of mitral stenosis.   4. The aortic valve is tricuspid. Aortic valve regurgitation is not  visualized. No aortic stenosis is present.   5. The inferior vena cava is normal in size with greater than 50%  respiratory variability, suggesting right atrial pressure of 3 mmHg.   CPX 12/19/19: Exercise Time:    11:00   Speed (mph): 3.0       Grade (%): 7.5     RPE: 17   Reason stopped: dyspnea (7/10)   Additional symptoms: None reported   Resting HR: 85 Standing HR: 86 Peak HR: 161   (100% age predicted max HR)   BP rest: 128/84 Standing BP: 116/82 BP peak: 166/72   Peak VO2: 17.7 (97%% predicted peak VO2)   VE/VCO2 slope:  29   OUES: 2.02   Peak RER: 1.08   Ventilatory Threshold: 13.0 (71% predicted or measured peak VO2)   Peak RR 38   Peak Ventilation:  56.8   VE/MVV:  54%   PETCO2 at peak:  38   O2pulse:  11   (100% predicted O2pulse)    Interpretation   Notes: Patient gave a very good effort. Pulse-oximetry remained 97-99% for the duration of exercise.   ECG:  Resting ECG in sinus rhythm with occasional PVCs. HR response appropriate. There were frequent PVCs during exercise with no sustained arrhythmias or ST-T changes.   PFT:  Pre-exercise spirometry was within normal limits. The MVV was normal.   CPX:  Exercise testing with gas exchange demonstrates a normal peak VO2 of 17.7 ml/kg/min (97% of the age/gender/weight matched sedentary norms). The RER of 1.08  indicates a near maximal effort. When adjusted to the patient's ideal body weight of 153.1 lb (69.5 kg) the peak VO2 is 24.7 ml/kg (ibw)/min (108% of the ibw-adjusted predicted). The VE/VCO2 slope is normal. The oxygen uptake efficiency slope (OUES) is normal. The  VO2 at the ventilatory threshold was normal at 71% of the predicted peak VO2. At peak exercise, the ventilation reached 54% of the measured MVV indicating ventilatory reserve remained. The O2pulse (a surrogate for stroke volume) increased with incremental exercise, reaching peak 11 ml/beat (100% predicted).    Conclusion: Exercise testing with gas exchange demonstrates normal functional capacity when compared to matched sedentary norms. There is no clear cardiopulmonary limitation. With corrections for ideal body weight, patient's body habitus appears the limiting factor. Overall CPX essentially unchanged from 2018.  Cath 2017: There is severe left ventricular systolic dysfunction. LV end diastolic pressure is normal. The left ventricular ejection fraction is 25-35% by visual estimate.   1. Normal coronary anatomy 2. Severe LV dysfunction 3. Elevated LV EDP   Plan: medical management.     EKG:  EKG is personally reviewed. 07/27/2022:  EKG was not ordered. 06/18/2022 (ED):  Sinus rhythm at 78 bpm. Low voltage, precordial leads.  03/01/2022:  Sinus rhythm. Rate 58 bpm.   02/04/2021: NSR with HR 72  Recent Labs: 04/19/2022: ALT 10 06/18/2022: Magnesium 1.9 10/01/2022: Hemoglobin 10.8; Platelets 245 12/07/2022: BUN 16; Creatinine, Ser 0.89; Potassium 4.4; Sodium 144 12/20/2022: TSH 2.220   Recent Lipid Panel    Component Value Date/Time   CHOL 186 03/07/2022 0940   TRIG 54 03/07/2022 0940   HDL 88 03/07/2022 0940   CHOLHDL 2.1 03/07/2022 0940   LDLCALC 88 03/07/2022 0940     Physical Exam:    VS:  BP 111/80   Pulse 81   Ht 5\' 7"  (1.702 m)   Wt 218 lb 12.8 oz (99.2 kg)   SpO2 98%   BMI 34.27 kg/m     Wt Readings from  Last 3 Encounters:  01/20/23 218 lb 12.8 oz (99.2 kg)  12/20/22 219 lb (99.3 kg)  12/07/22 216 lb 6.4 oz (98.2 kg)     GEN:  Comfortable, NAD HEENT: Normal NECK: No JVD; No carotid bruits CARDIAC: RRR, no murmurs RESPIRATORY:  CTAB, no wheezes ABDOMEN: Soft, non-tender, non-distended MUSCULOSKELETAL:  No edema; No deformity  SKIN: Warm and dry NEUROLOGIC:  Alert and oriented x 3 PSYCHIATRIC:  Normal affect   ASSESSMENT:    1. NICM (nonischemic cardiomyopathy) ([place])   2. Chronic combined systolic and diastolic HF (heart failure) (HCC)   3. Paroxysmal atrial fibrillation ([place])   4. Hypertension, unspecified type   5. Essential hypertension   6. PE (pulmonary thromboembolism) ([place])   7. S/P ICD (internal cardiac defibrillator) procedure   8. Other hyperlipidemia     PLAN:    In order of problems listed above:  #Atrial Fibrillation: CHADs-vasc 3-4. Patient had episode where she had HR up to 160s for about 1 hour on 06/18/22. Was symptomatic at that time with lightheadedness, dizziness, palpitations, and chest tightness. Device did not fire. Called EMS and was taken to [facility].  Does not appear rhythm was captured by EMS. She is currently on apxiaban for [facility]. On coreg for rate control. Has rare palpitations but no sustained episodes since 05/2022. Will continue with current therapies.  -Continue apixaban 5mg  BID -Continue coreg 3.125mg  BID -Follows with Dr. [name]  #Chronic Systolic HF: #Non-ischemic CM: Longstanding NICM since 2007. Cath 2017 with normal coronaries; TTE 10/2019 with LVEF 25-30%, RV normal. TTE 03/2022 with 35-40%, G1DD, mild MR, normal RAP.  CPX with normal functional capacity. Currently with NYHA class II symptoms. Compensated on exam. -Continue entresto 97/103mg  BID (cough did not improve with stopping entresto) -Continue coreg 3.125mg   BID -Continue eplerenone 25mg  daily -Off laisx -No farxiga to given history of yeast infections -Did not tolerate spiro and  could not afford bidil -S/p ICD placement  #Recurrent PE: -On lifelong apixaban 5mg  BID  #HTN: Well controlled and at goal <130/90. -Continue coreg and entresto as above  #HLD: -Having muscle aches with crestor and has not tolerated prava or simva -Will trial decreasing crestor to 2.5mg  daily and if symptoms do not improve, will change to zetia 10mg  daily   Follow-up:  6 months.  Medication Adjustments/Labs and Tests Ordered: Current medicines are reviewed at length with the patient today.  Concerns regarding medicines are outlined above.   Orders Placed This Encounter  Procedures   Lipid panel   Hemoglobin A1c   Meds ordered this encounter  Medications   sacubitril-valsartan (ENTRESTO) 97-103 MG    Sig: Take 1 tablet by mouth 2 (two) times daily.    Dispense:  180 tablet    Refill:  3   Patient Instructions  Medication Instructions:  Your physician recommends that you continue on your current medications as directed. Please refer to the Current Medication list given to you today.  *If you need a refill on your cardiac medications before your next appointment, please call your pharmacy*   Lab Work: Lipids, [facility] If you have labs (blood work) drawn today and your tests are completely normal, you will receive your results only by: [place] (if you have MyChart) OR A paper copy in the mail If you have any lab test that is abnormal or we need to change your treatment, we will call you to review the results.   Follow-Up: At [facility], you and your health needs are our priority.  As part of our continuing mission to provide you with exceptional heart care, we have created designated Provider Care Teams.  These Care Teams include your primary Cardiologist (physician) and Advanced Practice Providers (APPs -  Physician Assistants and Nurse Practitioners) who all work together to provide you with the care you need, when you need it.   Your next appointment:    6 month(s)  Provider:   Dr. [name]       Signed, [name] Bergeron, MD 01/20/2023  9:33 AM [place]

## 2023-01-20 ENCOUNTER — Ambulatory Visit: Payer: Medicaid Other | Attending: Cardiology | Admitting: Cardiology

## 2023-01-20 ENCOUNTER — Encounter: Payer: Self-pay | Admitting: Cardiology

## 2023-01-20 VITALS — BP 111/80 | HR 81 | Ht 67.0 in | Wt 218.8 lb

## 2023-01-20 DIAGNOSIS — I480 Paroxysmal atrial fibrillation: Secondary | ICD-10-CM | POA: Diagnosis not present

## 2023-01-20 DIAGNOSIS — I5042 Chronic combined systolic (congestive) and diastolic (congestive) heart failure: Secondary | ICD-10-CM | POA: Diagnosis not present

## 2023-01-20 DIAGNOSIS — I2699 Other pulmonary embolism without acute cor pulmonale: Secondary | ICD-10-CM

## 2023-01-20 DIAGNOSIS — I10 Essential (primary) hypertension: Secondary | ICD-10-CM | POA: Diagnosis not present

## 2023-01-20 DIAGNOSIS — I428 Other cardiomyopathies: Secondary | ICD-10-CM | POA: Diagnosis not present

## 2023-01-20 DIAGNOSIS — E7849 Other hyperlipidemia: Secondary | ICD-10-CM | POA: Diagnosis not present

## 2023-01-20 DIAGNOSIS — Z95810 Presence of automatic (implantable) cardiac defibrillator: Secondary | ICD-10-CM

## 2023-01-20 MED ORDER — ENTRESTO 97-103 MG PO TABS: 1.0000 | ORAL_TABLET | Freq: Two times a day (BID) | ORAL | 3 refills | Status: DC

## 2023-01-20 NOTE — Patient Instructions (Signed)
Medication Instructions:  Your physician recommends that you continue on your current medications as directed. Please refer to the Current Medication list given to you today.  *If you need a refill on your cardiac medications before your next appointment, please call your pharmacy*   Lab Work: Lipids, [facility] If you have labs (blood work) drawn today and your tests are completely normal, you will receive your results only by: [place] (if you have MyChart) OR A paper copy in the mail If you have any lab test that is abnormal or we need to change your treatment, we will call you to review the results.   Follow-Up: At [facility], you and your health needs are our priority.  As part of our continuing mission to provide you with exceptional heart care, we have created designated Provider Care Teams.  These Care Teams include your primary Cardiologist (physician) and Advanced Practice Providers (APPs -  Physician Assistants and Nurse Practitioners) who all work together to provide you with the care you need, when you need it.   Your next appointment:   6 month(s)  Provider:   Dr. [name]

## 2023-01-21 LAB — LIPID PANEL
Chol/HDL Ratio: 2.5 ratio (ref 0.0–4.4)
Cholesterol, Total: 188 mg/dL (ref 100–199)
HDL: 75 mg/dL (ref >39)
LDL Chol Calc (NIH): 101 mg/dL — ABNORMAL HIGH (ref 0–99)
Triglycerides: 64 mg/dL (ref 0–149)
VLDL Cholesterol Cal: 12 mg/dL (ref 5–40)

## 2023-01-21 LAB — HEMOGLOBIN A1C
Est. average glucose Bld gHb Est-mCnc: 123 mg/dL
Hgb A1c MFr Bld: 5.9 % — ABNORMAL HIGH (ref 4.8–5.6)

## 2023-01-23 ENCOUNTER — Telehealth: Payer: Self-pay | Admitting: Cardiology

## 2023-01-23 ENCOUNTER — Encounter: Payer: Self-pay | Admitting: Family Medicine

## 2023-01-23 MED ORDER — EZETIMIBE 10 MG PO TABS: 10.0000 mg | ORAL_TABLET | Freq: Every day | ORAL | 3 refills | Status: DC

## 2023-01-23 NOTE — Telephone Encounter (Signed)
-----   Message from Freada Bergeron, MD sent at 01/23/2023  4:25 PM EDT ----- Spoke with Fuller Canada and apparently we are good to go. ----- Message ----- From: Gershon Crane, LPN Sent: [id number]   3:14 PM EDT To: Freada Bergeron, MD; Molli Barrows   ----- Message ----- From: Freada Bergeron, MD Sent: 01/23/2023   2:42 PM EDT To: Tomasa Hosteller, LPN  We can stop the crestor and change to zetia 10mg  daily and see if this helps. ----- Message ----- From: Gershon Crane, LPN Sent: [id number]   2:36 PM EDT To: Freada Bergeron, MD; Molli Barrows  Please advise ----- Message ----- From: Freada Bergeron, MD Sent: 01/21/2023   8:29 PM EDT To: [facility] Triage  Cholesterol looks okay. A1C remains in prediabetic range. Let us know if her muscle aches improve on the new crestor dose and if not, we can change the meds.

## 2023-01-23 NOTE — Telephone Encounter (Signed)
Spoke with patient and discussed Dr. Jacolyn Reedy recommendation:  Stop crestor and start zetia 10mg  daily.   Dr. [name] reviewed Zetia flagging for moxifloxacin intolerance with Pharm D, OK to take zetia.  Rx sent to pharmacy of choice. Crestor removed from medication list.  Patient verbalized understanding and expressed appreciation for follow-up.

## 2023-02-01 DIAGNOSIS — R208 Other disturbances of skin sensation: Secondary | ICD-10-CM | POA: Diagnosis not present

## 2023-02-01 DIAGNOSIS — K219 Gastro-esophageal reflux disease without esophagitis: Secondary | ICD-10-CM | POA: Diagnosis not present

## 2023-02-05 NOTE — Progress Notes (Unsigned)
[name] [name], female    DOB: [birth year]   MRN: [id number]   Brief patient profile:  13 yobf never smoker f/b Shari Prows and Graciela Husbands for cardiac problems since 2007  referred to pulmonary clinic 11/28/2022 by Emelia Salisbury for "allergies" about the same time covid became prevalent and since then 2 documented covid infections the first one in Jan 2021 last one in Oct 2023.    Notes indicate ACEi cough in 2015 per Dr Myrtis Ser   Dx R dvt PE p covid and recurred and none  since placed on eliquis "indefinitely"     History of Present Illness  11/28/2022  Pulmonary/ 1st office eval/[name] [name] sensation of globus x years/ placed on Entresto 12/2015  Chief Complaint  Patient presents with   Consult    Follow up from ED visit.  Chest pressure and cough.  COVID October 2023.  Hx of pulmonary embolism.  Negative with this visit.  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   GERD diet reviewed, bed blocks rec   I will send Dr Shari Prows a message to ask to stop the Entresto  x 4-6 weeks and I will see you in 5 weeks [place] > helped some, ? No worse p started back on entresto though     02/06/2023  f/u ov/[place] office/[name] [name] re: cough since covid  maint on gerd rx  and zyrtec  Chief Complaint  Patient presents with   Follow-up    Pt f/u states that her breathing is fine   Dyspnea:  tired, not limited by breathing  Cough: worse at hs does not keep her up  and recurs p stirring in am/ using cinnamon to control  Sleeping: flat bed / flat  pillow  SABA use: not using  02: oxygen      No obvious day to day or daytime variability or assoc excess/ purulent sputum or mucus plugs or hemoptysis or cp or chest tightness, subjective wheeze or overt sinus or hb symptoms.     Also denies any obvious fluctuation of symptoms with weather or environmental changes or other aggravating or alleviating factors except as outlined above   No unusual exposure hx or h/o childhood pna/ asthma or knowledge of premature birth.  Current Allergies, Complete Past Medical History, Past Surgical History, Family History, and Social History were reviewed in [employer] record.  ROS  The following are not active complaints unless bolded Hoarseness, sore throat, dysphagia, dental problems, itching, sneezing,  nasal congestion or discharge of excess mucus or purulent secretions, ear ache,   fever, chills, sweats, unintended wt loss or wt gain, classically pleuritic or exertional cp,  orthopnea pnd or arm/hand swelling  or leg swelling, presyncope, palpitations, abdominal pain, anorexia, nausea, vomiting, diarrhea  or change in bowel habits or change in bladder habits, change in stools or change in urine, dysuria, hematuria,  rash, arthralgias, visual complaints, headache, numbness, weakness or ataxia or problems  with walking or coordination,  change in mood or  memory.        Current Meds  Medication Sig   acetaminophen (TYLENOL) 500 MG tablet Take 500 mg by mouth every 6 (six) hours as needed for mild pain or moderate pain.   albuterol (VENTOLIN HFA) 108 (90 Base) MCG/ACT inhaler Inhale 2 puffs into the lungs every 6 (six) hours as needed for wheezing or shortness of breath.   apixaban (ELIQUIS) 5 MG TABS tablet Take 1 tablet (5 mg total) by mouth 2 (two) times daily.   carvedilol (COREG) 3.125 MG tablet Take 1 tablet by mouth twice daily   cetirizine (ZYRTEC) 10 MG tablet Take 1 tablet by mouth daily.   eplerenone (INSPRA) 25 MG tablet  TAKE 1 TABLET BY MOUTH ONCE DAILY.   ezetimibe (ZETIA) 10 MG tablet Take 1 tablet (10 mg total) by mouth daily.   famotidine (PEPCID) 20 MG tablet One after supper   levothyroxine (SYNTHROID) 75 MCG tablet Take 1 tablet (75 mcg total) by mouth daily before breakfast.   pantoprazole (PROTONIX) 40 MG tablet Take 1 tablet (40 mg total) by mouth daily. Take 30-60 min before first meal of the day   sacubitril-valsartan (ENTRESTO) 97-103 MG Take 1 tablet by mouth 2 (two) times daily.           .           Past Medical History:  Diagnosis Date   AICD (automatic cardioverter/defibrillator) present 09   [employer]- replaced 2016   Arthritis    Bradycardia    Breast cancer (HCC) 02/24/2016   s/p lumpectomy and radiation but refused chemotherapy   Chronic systolic CHF (congestive heart failure) (HCC)    Cough due to angiotensin-converting enzyme inhibitor    Dizziness    Dizziness with standing after squatting, March, 2012   Dyslipidemia    LDL elevation,Patient does not want statin   Family history of breast cancer    GERD (gastroesophageal reflux disease)    H/O shortness of breath    CPX 10/18:  No evidence of cardiopulmonary limitation. Suspect exercise intolerance related to weight and deconditioning.    Hypothyroidism    ICD (implantable cardiac defibrillator) battery depletion    Dr. Graciela Husbands, June, 2009(artifactual atrial tachycardia response events addressed by reprogramming in parentheses   Leg swelling    right leg   Mitral regurgitation    Nonischemic cardiomyopathy (HCC)    Etiology unknown, diagnosed 2008   Paroxysmal atrial fibrillation (HCC) 06/20/2022   Personal history of radiation therapy 2017   Pneumonia due to COVID-19 virus 10/2019   TIA (transient ischemic attack)    No CT or MRI abnormality, aspirin therapy       Objective:     Wt Readings from Last 3 Encounters:  02/06/23 214 lb 12.8 oz (97.4 kg)  01/20/23 218 lb 12.8 oz (99.2 kg)  12/20/22 219  lb (99.3 kg)      Vital signs reviewed  02/06/2023  - Note at rest 02 sats  97% on RA   General appearance:    amb bf occ throat cleairng      HEENT : Oropharynx  pistine, no pnd or cobblestoning      Nasal turbinates nl    NECK :  without  apparent JVD/ palpable Nodes/TM    LUNGS: no acc muscle use,  Nl contour chest which is clear to A and P bilaterally without cough on insp or exp maneuvers   CV:  RRR  no s3 or murmur or increase in P2, and no edema   ABD:  soft and nontender with nl inspiratory excursion in the supine position. No bruits or organomegaly appreciated   MS:  Nl gait/ ext warm without deformities Or obvious joint restrictions  calf tenderness, cyanosis or clubbing    SKIN: warm and dry without lesions    NEURO:  alert, approp, nl sensorium with  no motor or cerebellar deficits apparent.          I personally reviewed images and agree with radiology impression as follows:   Chest CTa 10/01/22 1. Negative for acute pulmonary embolus. Resolved right lower lobe pulmonary embolus seen in June. 2. No acute or inflammatory process in the chest. 3. Cardiomegaly with AICD.    Assessment

## 2023-02-06 ENCOUNTER — Encounter: Payer: Self-pay | Admitting: Internal Medicine

## 2023-02-06 ENCOUNTER — Ambulatory Visit: Payer: Medicaid Other | Admitting: Internal Medicine

## 2023-02-06 ENCOUNTER — Encounter: Payer: Self-pay | Admitting: Cardiology

## 2023-02-06 VITALS — BP 90/52 | Ht 67.0 in | Wt 214.8 lb

## 2023-02-06 DIAGNOSIS — R0609 Other forms of dyspnea: Secondary | ICD-10-CM

## 2023-02-06 DIAGNOSIS — R058 Other specified cough: Secondary | ICD-10-CM

## 2023-02-06 MED ORDER — ENTRESTO 49-51 MG PO TABS: 1.0000 | ORAL_TABLET | Freq: Two times a day (BID) | ORAL | 3 refills | Status: DC

## 2023-02-06 NOTE — Patient Instructions (Addendum)
For drainage / throat tickle try take CHLORPHENIRAMINE  4 mg  ("Allergy Relief" 4mg   at [facility] should be easiest to find in the blue box usually on bottom shelf)  take one every 4 hours as needed - extremely effective and inexpensive over the counter- may cause drowsiness so start with just a dose or two an hour before bedtime and see how you tolerate it before trying in daytime.   Strongly recommend bed blocks x 6-8 inches   Please remember to go to the lab department   for your tests - we will call you with the results when they are available.  Please schedule a follow up visit in 3 months but call sooner if needed

## 2023-02-06 NOTE — Assessment & Plan Note (Addendum)
Onset in 2015 while on ACEi - Entresto added 2017 with onset of "allergies" around 2020 - 11/28/2022 rec trial off entresto and on Valsartan max doses  plus max gerd rx >>>helped some - ENT eval  02/01/23 c/w GERD  - Allergy screen 02/06/2023 >  Eos 0. /  IgE  pending  - 02/06/2023 added 1st gen H1 blockers per guidelines    In retrospect cough better off entresto but throat clearing continued and now cough evolving back again? Attributable to pnds "allergy season"   Rec  Add  1st gen H1 blockers per guidelines  to max gerd rx which should not include cinnamon  Add bed blocks Check allergy profile .  F/u 3 m call sooner if needed

## 2023-02-06 NOTE — Assessment & Plan Note (Signed)
Much worse since onset of refractory cough ? Related to Covid initially?  - Echo 04/20/22 LV EF  35 to 40% with G1 diastolic dysfunction with  Mild MR  -  CTa  10/01/22 neg  -  11/28/2022   Walked on RA  x 2   lap(s) =  approx 500  ft  @ mod pace, stopped due to tired/back pain  with lowest 02 sats 98% and no sob   - rec trial off entresto 11/28/2022 >>> breathing no worse but cough did not resolve completely so restarted   Reasonable to continue entresto for now with  low threshold to rechallenge off if x 6-8 weeks) if not worse sob on just the valsartan component in double the dose  twice daily  (apparently this was the case, that is, no worse doe off entresto and on valsartan).    Since into of gabapentin I really have nothing else to offer.           Each maintenance medication was reviewed in detail including emphasizing most importantly the difference between maintenance and prns and under what circumstances the prns are to be triggered using an action plan format where appropriate.  Total time for H and P, chart review, counseling, and generating customized AVS unique to this office visit / same day chartin > 30 min for    refractory respiratory  symptoms of uncertain etiology m

## 2023-02-09 ENCOUNTER — Ambulatory Visit (INDEPENDENT_AMBULATORY_CARE_PROVIDER_SITE_OTHER): Payer: Medicaid Other

## 2023-02-09 DIAGNOSIS — I428 Other cardiomyopathies: Secondary | ICD-10-CM

## 2023-02-09 LAB — CUP PACEART REMOTE DEVICE CHECK
Battery Remaining Longevity: 54 mo
Battery Remaining Percentage: 61 %
Brady Statistic RA Percent Paced: 1 %
Brady Statistic RV Percent Paced: 1 %
Date Time Interrogation Session: 20240418002700
HighPow Impedance: 55 Ohm
Implantable Lead Connection Status: 753985
Implantable Lead Connection Status: 753985
Implantable Lead Implant Date: 20090320
Implantable Lead Implant Date: 20090320
Implantable Lead Location: 753859
Implantable Lead Location: 753860
Implantable Lead Model: 158
Implantable Lead Model: 5076
Implantable Lead Serial Number: 182504
Implantable Pulse Generator Implant Date: 20160307
Lead Channel Impedance Value: 515 Ohm
Lead Channel Impedance Value: 555 Ohm
Lead Channel Pacing Threshold Amplitude: 0.5 V
Lead Channel Pacing Threshold Amplitude: 1.2 V
Lead Channel Pacing Threshold Pulse Width: 0.4 ms
Lead Channel Pacing Threshold Pulse Width: 0.4 ms
Lead Channel Setting Pacing Amplitude: 2 V
Lead Channel Setting Pacing Amplitude: 2.5 V
Lead Channel Setting Pacing Pulse Width: 0.4 ms
Lead Channel Setting Sensing Sensitivity: 0.6 mV
Pulse Gen Serial Number: 111826

## 2023-02-11 LAB — CBC WITH DIFFERENTIAL/PLATELET
Basophils Absolute: 0 10*3/uL (ref 0.0–0.2)
Basos: 1 %
EOS (ABSOLUTE): 0.1 10*3/uL (ref 0.0–0.4)
Eos: 1 %
Hematocrit: 35.6 % (ref 34.0–46.6)
Hemoglobin: 11.6 g/dL (ref 11.1–15.9)
Immature Grans (Abs): 0 10*3/uL (ref 0.0–0.1)
Immature Granulocytes: 0 %
Lymphocytes Absolute: 2.3 10*3/uL (ref 0.7–3.1)
Lymphs: 41 %
MCH: 30.4 pg (ref 26.6–33.0)
MCHC: 32.6 g/dL (ref 31.5–35.7)
MCV: 93 fL (ref 79–97)
Monocytes Absolute: 0.4 10*3/uL (ref 0.1–0.9)
Monocytes: 8 %
Neutrophils Absolute: 2.8 10*3/uL (ref 1.4–7.0)
Neutrophils: 49 %
Platelets: 257 10*3/uL (ref 150–450)
RBC: 3.82 x10E6/uL (ref 3.77–5.28)
RDW: 12.6 % (ref 11.7–15.4)
WBC: 5.6 10*3/uL (ref 3.4–10.8)

## 2023-02-11 LAB — IGE: IgE (Immunoglobulin E), Serum: 116 IU/mL (ref 6–495)

## 2023-02-21 ENCOUNTER — Telehealth: Payer: Self-pay | Admitting: *Deleted

## 2023-02-21 NOTE — Telephone Encounter (Signed)
   Pre-operative Risk Assessment    Patient Name: [name] [name]  DOB: [birth year] MRN: [id number]     Request for Surgical Clearance    Procedure:   LEFT MESSAGE FOR OFFICE TO CALL BACK AND CLARIFY PROCEDURE TO BE DONE AT THIS TIME. WE WILL NOT BE ABLE TO PROVIDE A BLANKET TYPE CLEARANCE.  Date of Surgery:  Clearance TBD                                 Surgeon:  DR. Guadelupe Sabin, DDS, MHA Surgeon's Group or Practice Name:  [facility] Phone number:  [phone] Fax number:  [phone]   Type of Clearance Requested:   - Medical  - Pharmacy:  Hold Apixaban (Eliquis)   ALSO WILL NEED TO CONFIRM IF THEY ARE GOING TO NEED DEVICE CLEARANCE, IF SO WE WILL NEED TO SEND TO DEVICE AS WELL.   5. What type of anesthesia will be used?  Press F2 and select the anesthesia to be used for the procedure.  :1}  Type of Anesthesia:  Local  (w/EPI)   Additional requests/questions:    Signed, Danielle Rankin   02/21/2023, 5:06 PM

## 2023-02-22 NOTE — Telephone Encounter (Signed)
   Patient Name: [name] [name]  DOB: 05/15/1961 MRN: [id number]  Primary Cardiologist: Meriam Sprague, MD  Chart reviewed as part of pre-operative protocol coverage.    Simple dental extractions (i.e. 1-2 teeth) and fillings are considered low risk procedures per guidelines and generally do not require any specific cardiac clearance. It is also generally accepted that for simple extractions and dental cleanings, there is no need to interrupt blood thinner therapy.    Patient's Eliquis is not managed by cardiology and clearance should come from prescribing provider.  Device clearance will be sent and a separate fax  SBE prophylaxis is not  required for the patient from a cardiac standpoint.  I will route this recommendation to the requesting party via Epic fax function and remove from pre-op pool.  Please call with questions.  Napoleon Form, Leodis Rains, NP 02/22/2023, 9:29 AM

## 2023-02-22 NOTE — Telephone Encounter (Signed)
ADDENDUM TO CLEARANCE:  PROCEDURE: 3 FILLINGS ONLY AT THIS TIME  WILL ALSO NEED DEVICE CLEARANCE. I WILL BRING THE FORM TO DEVICE CLINIC AS WELL AS FORWARD THE CLEARANCE TO DEVICE CLINIC.  AS FYI WE CANNOT PROVIDE A BLANKET TYPE CLEARANCE DUE TO BLOOD THINNER. WHEN READY FOR THE NEXT PROCEDURE PLEASE FAX NEW CLEARANCE FOR THE PROCEDURE THAT WILL BE DONE AT THAT TIME. IF PT IS HAVING EXTRACTIONS WE WILL NEED TO KNOW HOW MANY TEETH AND IF SIMPLE OR SURGICAL EXTRACTIONS.

## 2023-02-23 NOTE — Telephone Encounter (Signed)
[facility] is calling to update on type of procedure.  Procedure type will be fillings and cleaning only.   Cleaning - 04/05/2023 Filling - 06/12/2023 - 06/19/23

## 2023-03-08 ENCOUNTER — Encounter (HOSPITAL_BASED_OUTPATIENT_CLINIC_OR_DEPARTMENT_OTHER): Payer: Self-pay | Admitting: Emergency Medicine

## 2023-03-08 ENCOUNTER — Emergency Department (HOSPITAL_BASED_OUTPATIENT_CLINIC_OR_DEPARTMENT_OTHER)
Admission: EM | Admit: 2023-03-08 | Discharge: 2023-03-08 | Disposition: A | Discharge status: Home / Self Care | Payer: Medicaid Other | Attending: Emergency Medicine | Admitting: Emergency Medicine

## 2023-03-08 ENCOUNTER — Other Ambulatory Visit: Payer: Self-pay

## 2023-03-08 ENCOUNTER — Emergency Department (HOSPITAL_BASED_OUTPATIENT_CLINIC_OR_DEPARTMENT_OTHER): Payer: Medicaid Other | Admitting: Radiology

## 2023-03-08 DIAGNOSIS — R0789 Other chest pain: Secondary | ICD-10-CM | POA: Diagnosis not present

## 2023-03-08 DIAGNOSIS — Z7901 Long term (current) use of anticoagulants: Secondary | ICD-10-CM | POA: Insufficient documentation

## 2023-03-08 DIAGNOSIS — Z95810 Presence of automatic (implantable) cardiac defibrillator: Secondary | ICD-10-CM | POA: Diagnosis not present

## 2023-03-08 DIAGNOSIS — R079 Chest pain, unspecified: Secondary | ICD-10-CM | POA: Diagnosis not present

## 2023-03-08 DIAGNOSIS — R053 Chronic cough: Secondary | ICD-10-CM | POA: Insufficient documentation

## 2023-03-08 LAB — CBC
HCT: 35.5 % — ABNORMAL LOW (ref 36.0–46.0)
Hemoglobin: 11.3 g/dL — ABNORMAL LOW (ref 12.0–15.0)
MCH: 30.5 pg (ref 26.0–34.0)
MCHC: 31.8 g/dL (ref 30.0–36.0)
MCV: 95.7 fL (ref 80.0–100.0)
Platelets: 249 10*3/uL (ref 150–400)
RBC: 3.71 MIL/uL — ABNORMAL LOW (ref 3.87–5.11)
RDW: 12.5 % (ref 11.5–15.5)
WBC: 6.1 10*3/uL (ref 4.0–10.5)
nRBC: 0 % (ref 0.0–0.2)

## 2023-03-08 LAB — BASIC METABOLIC PANEL
Anion gap: 7 (ref 5–15)
BUN: 12 mg/dL (ref 8–23)
CO2: 28 mmol/L (ref 22–32)
Calcium: 9.5 mg/dL (ref 8.9–10.3)
Chloride: 105 mmol/L (ref 98–111)
Creatinine, Ser: 0.97 mg/dL (ref 0.44–1.00)
GFR, Estimated: >60 mL/min (ref >60)
Glucose, Bld: 101 mg/dL — ABNORMAL HIGH (ref 70–99)
Potassium: 4.1 mmol/L (ref 3.5–5.1)
Sodium: 140 mmol/L (ref 135–145)

## 2023-03-08 LAB — TROPONIN I (HIGH SENSITIVITY): Troponin I (High Sensitivity): 2 ng/L (ref <18)

## 2023-03-08 LAB — BRAIN NATRIURETIC PEPTIDE: B Natriuretic Peptide: 37.9 pg/mL (ref 0.0–100.0)

## 2023-03-08 NOTE — Discharge Instructions (Signed)
You were seen in the emergency department for your chest pain and your cough.  Your workup showed no signs of heart attack, no fluid on your lungs and no signs of pneumonia.  It is unclear what is causing your symptoms at this time but you may have some inflammation of your chest wall or strained muscles from your cough and can take Tylenol as needed for pain.  You can try Mucinex or raw honey in hot water or tea to help with your cough.  You can follow-up with your primary doctor, cardiologist and pulmonologist for further workup and evaluation.  You should return to the emergency department for significantly worsening pain, if you cough up blood, you have severe shortness of breath or if you have any other new or concerning symptoms.

## 2023-03-08 NOTE — ED Triage Notes (Signed)
Pt arrives to ED with c/o substernal chest pains x1 week associated with dry cough.

## 2023-03-08 NOTE — ED Provider Notes (Signed)
[place] EMERGENCY DEPARTMENT AT [facility] Provider Note   CSN: [id number] Arrival date & time: 03/08/23  1231     History  Chief Complaint  Patient presents with   Chest Pain    [name] [name] is a 62 y.o. female.  Patient is a 62 year old female with a past medical history of nonischemic cardiomyopathy with ICD in place, prior PE on Eliquis, GERD presenting to the emergency department with cough and chest pain.  Patient states that she has had a chronic cough for the past several weeks.  She states that she has been seen by her pulmonologist who thought that it might be reflux related and started her on an antacid medication.  She states that she has had no improvement of the cough and over the last several days started to develop chest pain.  She states it feels like a midsternal sharp stabbing pain that is worse when she coughs or takes deep breaths.  She states that she has been compliant on her Eliquis without missing any doses.  She denies any fevers or lower extremity swelling.  The history is provided by the patient.  Chest Pain      Home Medications Prior to Admission medications   Medication Sig Start Date End Date Taking? Authorizing Provider  acetaminophen (TYLENOL) 500 MG tablet Take 500 mg by mouth every 6 (six) hours as needed for mild pain or moderate pain.    [provider]  albuterol (VENTOLIN HFA) 108 (90 Base) MCG/ACT inhaler Inhale 2 puffs into the lungs every 6 (six) hours as needed for wheezing or shortness of breath. 09/20/22   Junie Spencer, FNP  apixaban (ELIQUIS) 5 MG TABS tablet Take 1 tablet (5 mg total) by mouth 2 (two) times daily. 05/06/22   Jacklynn Ganong, FNP  carvedilol (COREG) 3.125 MG tablet Take 1 tablet by mouth twice daily 01/02/23   Jacklynn Ganong, FNP  cetirizine (ZYRTEC) 10 MG tablet Take 1 tablet by mouth daily.    [provider]  eplerenone (INSPRA) 25 MG tablet TAKE 1 TABLET BY MOUTH ONCE DAILY.  12/07/22   Perlie Gold, PA-C  ezetimibe (ZETIA) 10 MG tablet Take 1 tablet (10 mg total) by mouth daily. 01/23/23   Meriam Sprague, MD  famotidine (PEPCID) 20 MG tablet One after supper 11/28/22   Nyoka Cowden, MD  levothyroxine (SYNTHROID) 75 MCG tablet Take 1 tablet (75 mcg total) by mouth daily before breakfast. 05/17/22   Delynn Flavin M, DO  pantoprazole (PROTONIX) 40 MG tablet Take 1 tablet (40 mg total) by mouth daily. Take 30-60 min before first meal of the day 11/28/22   Nyoka Cowden, MD  sacubitril-valsartan (ENTRESTO) 49-51 MG Take 1 tablet by mouth 2 (two) times daily. 02/06/23   Meriam Sprague, MD  magic mouthwash (nystatin, lidocaine, diphenhydrAMINE, alum & mag hydroxide) suspension Swish and spit 5 mLs 4 (four) times daily as needed for mouth pain. 12/12/22   Valentino Nose, NP      Allergies    Avelox [moxifloxacin hcl in nacl], Clindamycin/lincomycin, Kiwi extract, Bactrim [sulfamethoxazole-trimethoprim], Levofloxacin, Simvastatin, Spironolactone, Moxifloxacin hcl, Rosuvastatin, and Tramadol    Review of Systems   Review of Systems  Cardiovascular:  Positive for chest pain.    Physical Exam Updated Vital Signs BP 123/76   Pulse 70   Temp 98.5 F (36.9 C) (Oral)   Resp 14   Ht 5\' 7"  (1.702 m)   Wt 97.1 kg  SpO2 97%   BMI 33.52 kg/m  Physical Exam Vitals and nursing note reviewed.  Constitutional:      General: She is not in acute distress.    Appearance: She is well-developed.  HENT:     Head: Normocephalic and atraumatic.  Eyes:     Extraocular Movements: Extraocular movements intact.  Cardiovascular:     Rate and Rhythm: Normal rate and regular rhythm.     Pulses:          Radial pulses are 2+ on the right side and 2+ on the left side.     Heart sounds: Normal heart sounds.  Pulmonary:     Effort: Pulmonary effort is normal.     Breath sounds: Normal breath sounds.  Chest:     Chest wall: No tenderness.  Abdominal:      Palpations: Abdomen is soft.     Tenderness: There is no abdominal tenderness.  Musculoskeletal:        General: Normal range of motion.     Cervical back: Normal range of motion and neck supple.     Right lower leg: No edema.     Left lower leg: No edema.  Skin:    General: Skin is warm and dry.  Neurological:     General: No focal deficit present.     Mental Status: She is alert and oriented to person, place, and time.  Psychiatric:        Mood and Affect: Mood normal.        Behavior: Behavior normal.     ED Results / Procedures / Treatments   Labs (all labs ordered are listed, but only abnormal results are displayed) Labs Reviewed  BASIC METABOLIC PANEL - Abnormal; Notable for the following components:      Result Value   Glucose, Bld 101 (*)    All other components within normal limits  CBC - Abnormal; Notable for the following components:   RBC 3.71 (*)    Hemoglobin 11.3 (*)    HCT 35.5 (*)    All other components within normal limits  BRAIN NATRIURETIC PEPTIDE  TROPONIN I (HIGH SENSITIVITY)    EKG EKG Interpretation  Date/Time:  Wednesday Mar 08 2023 12:42:41 EDT Ventricular Rate:  69 PR Interval:  178 QRS Duration: 103 QT Interval:  388 QTC Calculation: 416 R Axis:   16 Text Interpretation: Sinus rhythm Borderline T abnormalities, anterior leads No significant change since last tracing Confirmed by Elayne Snare ([id number]) on 03/08/2023 12:44:15 PM  Radiology DG Chest 2 View  Result Date: 03/08/2023 CLINICAL DATA:  Chest pain. History of congestive heart failure and pulmonary embolism. EXAM: CHEST - 2 VIEW COMPARISON:  10/01/2022 FINDINGS: The heart size and mediastinal contours are within normal limits. AICD remains in appropriate position. Both lungs are clear. Surgical clips again seen at the level of the thoracic inlet. IMPRESSION: No active cardiopulmonary disease. Electronically Signed   By: Danae Orleans M.D.   On: 03/08/2023 13:04     Procedures Procedures    Medications Ordered in ED Medications - No data to display  ED Course/ Medical Decision Making/ A&P Clinical Course as of 03/08/23 1410  Wed Mar 08, 2023  1348 CXR without acute disease, labs normal including negative troponin and BNP. Chest pain has been ongoing several days so single troponin is sufficient. She is stable for discharge home with PCP follow up. [VK]    Clinical Course User Index [VK] Rexford Maus, DO  Medical Decision Making This patient presents to the ED with chief complaint(s) of cough, chest pain with pertinent past medical history of nonischemic cardiomyopathy, PE on Eliquis, GERD which further complicates the presenting complaint. The complaint involves an extensive differential diagnosis and also carries with it a high risk of complications and morbidity.    The differential diagnosis includes ACS, arrhythmia, anemia, pneumonia, pneumothorax, pulmonary edema, pleural effusion, viral syndrome, pericarditis, myocarditis, chest wall pain  Additional history obtained: Additional history obtained from N/A Records reviewed outpatient cardiology and pulmonology records  ED Course and Reassessment: On patient's arrival to the emergency department she is hemodynamically stable in no acute distress.  EKG performed on arrival showed normal sinus rhythm without acute ischemic changes.  The patient will have labs including troponin and BNP as well as chest x-ray to further evaluate for causes of her shortness of breath.  She is his normal vital signs and has been compliant on her Eliquis making recurrent PE unlikely.  Independent labs interpretation:  The following labs were independently interpreted: within normal range  Independent visualization of imaging: - I independently visualized the following imaging with scope of interpretation limited to determining acute life threatening conditions related to  emergency care: CXR, which revealed no acute disease  Consultation: - Consulted or discussed management/test interpretation w/ external professional: N/A  Consideration for admission or further workup: Patient has no emergent conditions requiring admission or further work-up at this time and is stable for discharge home with primary care follow-up  Social Determinants of health: N/A    Amount and/or Complexity of Data Reviewed Labs: ordered. Radiology: ordered.          Final Clinical Impression(s) / ED Diagnoses Final diagnoses:  Nonspecific chest pain  Chronic cough    Rx / DC Orders ED Discharge Orders     None         Rexford Maus, DO 03/08/23 1410

## 2023-03-08 NOTE — ED Notes (Signed)
Pt verbalized understanding of d/c instructions, meds, and followup care. Denies questions. VSS, no distress noted. Steady gait to exit with all belongings.  ?

## 2023-03-10 ENCOUNTER — Encounter: Payer: Self-pay | Admitting: Cardiology

## 2023-03-12 ENCOUNTER — Emergency Department (HOSPITAL_BASED_OUTPATIENT_CLINIC_OR_DEPARTMENT_OTHER): Payer: Medicaid Other

## 2023-03-12 ENCOUNTER — Other Ambulatory Visit: Payer: Self-pay

## 2023-03-12 ENCOUNTER — Emergency Department (HOSPITAL_BASED_OUTPATIENT_CLINIC_OR_DEPARTMENT_OTHER)
Admission: EM | Admit: 2023-03-12 | Discharge: 2023-03-12 | Disposition: A | Discharge status: Home / Self Care | Payer: Medicaid Other | Attending: Emergency Medicine | Admitting: Emergency Medicine

## 2023-03-12 ENCOUNTER — Encounter (HOSPITAL_BASED_OUTPATIENT_CLINIC_OR_DEPARTMENT_OTHER): Payer: Self-pay

## 2023-03-12 DIAGNOSIS — Z1152 Encounter for screening for COVID-19: Secondary | ICD-10-CM | POA: Diagnosis not present

## 2023-03-12 DIAGNOSIS — N8500 Endometrial hyperplasia, unspecified: Secondary | ICD-10-CM | POA: Insufficient documentation

## 2023-03-12 DIAGNOSIS — R110 Nausea: Secondary | ICD-10-CM | POA: Diagnosis present

## 2023-03-12 DIAGNOSIS — K5900 Constipation, unspecified: Secondary | ICD-10-CM | POA: Insufficient documentation

## 2023-03-12 DIAGNOSIS — R9389 Abnormal findings on diagnostic imaging of other specified body structures: Secondary | ICD-10-CM

## 2023-03-12 DIAGNOSIS — Z7901 Long term (current) use of anticoagulants: Secondary | ICD-10-CM | POA: Insufficient documentation

## 2023-03-12 DIAGNOSIS — R630 Anorexia: Secondary | ICD-10-CM | POA: Diagnosis not present

## 2023-03-12 DIAGNOSIS — I509 Heart failure, unspecified: Secondary | ICD-10-CM | POA: Insufficient documentation

## 2023-03-12 DIAGNOSIS — R5383 Other fatigue: Secondary | ICD-10-CM | POA: Diagnosis not present

## 2023-03-12 DIAGNOSIS — K529 Noninfective gastroenteritis and colitis, unspecified: Secondary | ICD-10-CM | POA: Diagnosis not present

## 2023-03-12 LAB — COMPREHENSIVE METABOLIC PANEL
ALT: 12 U/L (ref 0–44)
AST: 12 U/L — ABNORMAL LOW (ref 15–41)
Albumin: 4.2 g/dL (ref 3.5–5.0)
Alkaline Phosphatase: 45 U/L (ref 38–126)
Anion gap: 8 (ref 5–15)
BUN: 20 mg/dL (ref 8–23)
CO2: 28 mmol/L (ref 22–32)
Calcium: 9.3 mg/dL (ref 8.9–10.3)
Chloride: 103 mmol/L (ref 98–111)
Creatinine, Ser: 1.14 mg/dL — ABNORMAL HIGH (ref 0.44–1.00)
GFR, Estimated: 54 mL/min — ABNORMAL LOW (ref >60)
Glucose, Bld: 108 mg/dL — ABNORMAL HIGH (ref 70–99)
Potassium: 4.1 mmol/L (ref 3.5–5.1)
Sodium: 139 mmol/L (ref 135–145)
Total Bilirubin: 0.3 mg/dL (ref 0.3–1.2)
Total Protein: 7.2 g/dL (ref 6.5–8.1)

## 2023-03-12 LAB — URINALYSIS, ROUTINE W REFLEX MICROSCOPIC
Bacteria, UA: NONE SEEN
Bilirubin Urine: NEGATIVE
Glucose, UA: NEGATIVE mg/dL
Hgb urine dipstick: NEGATIVE
Ketones, ur: NEGATIVE mg/dL
Nitrite: NEGATIVE
Protein, ur: NEGATIVE mg/dL
Specific Gravity, Urine: 1.009 (ref 1.005–1.030)
pH: 5.5 (ref 5.0–8.0)

## 2023-03-12 LAB — LIPASE, BLOOD: Lipase: 32 U/L (ref 11–51)

## 2023-03-12 LAB — CBC
HCT: 37.2 % (ref 36.0–46.0)
Hemoglobin: 11.9 g/dL — ABNORMAL LOW (ref 12.0–15.0)
MCH: 30.3 pg (ref 26.0–34.0)
MCHC: 32 g/dL (ref 30.0–36.0)
MCV: 94.7 fL (ref 80.0–100.0)
Platelets: 286 10*3/uL (ref 150–400)
RBC: 3.93 MIL/uL (ref 3.87–5.11)
RDW: 12.6 % (ref 11.5–15.5)
WBC: 10.8 10*3/uL — ABNORMAL HIGH (ref 4.0–10.5)
nRBC: 0 % (ref 0.0–0.2)

## 2023-03-12 LAB — SARS CORONAVIRUS 2 BY RT PCR: SARS Coronavirus 2 by RT PCR: NEGATIVE

## 2023-03-12 LAB — BRAIN NATRIURETIC PEPTIDE: B Natriuretic Peptide: 13.8 pg/mL (ref 0.0–100.0)

## 2023-03-12 LAB — TROPONIN I (HIGH SENSITIVITY): Troponin I (High Sensitivity): <2 ng/L (ref <18)

## 2023-03-12 MED ORDER — IOHEXOL 300 MG/ML  SOLN
100.0000 mL | Freq: Once | INTRAMUSCULAR | Status: AC | PRN
  Administered 2023-03-12: 100 mL via INTRAVENOUS

## 2023-03-12 MED ORDER — SODIUM CHLORIDE 0.9 % IV BOLUS
500.0000 mL | Freq: Once | INTRAVENOUS | Status: AC
  Administered 2023-03-12: 500 mL via INTRAVENOUS

## 2023-03-12 NOTE — ED Notes (Signed)
ED Provider at bedside. 

## 2023-03-12 NOTE — ED Notes (Signed)
Patient returned from CT

## 2023-03-12 NOTE — ED Provider Notes (Signed)
[place] EMERGENCY DEPARTMENT AT [facility] Provider Note   CSN: [id number] Arrival date & time: 03/12/23  1641     History  Chief Complaint  Patient presents with   Nausea    [name] [name] is a 62 y.o. female presented to ED complaining of nausea and poor appetite.  Patient was seen in the ED 4 days ago for primarily respiratory symptoms at that time with cough and congestion.  Her blood work and x-rays were unremarkable.  She was discharged a likely viral illness.  She reports that the cough and respiratory symptoms had improved but she has had persistent satiety and poor appetite, reporting loss of appetite anytime she tries to eat.  She reports discomfort with eating specifically in her epigastrium.  She reports regular bowel movements, denies diarrhea.  She reports she has been extremely fatigued for the past several days.  There are no current sick contacts in the house but her grandson had viral URI symptoms about a week ago, although they were outside and not in close contact.  Pt has history of CHF and AICD, PE on eliquis  HPI     Home Medications Prior to Admission medications   Medication Sig Start Date End Date Taking? Authorizing Provider  acetaminophen (TYLENOL) 500 MG tablet Take 500 mg by mouth every 6 (six) hours as needed for mild pain or moderate pain.    [provider]  albuterol (VENTOLIN HFA) 108 (90 Base) MCG/ACT inhaler Inhale 2 puffs into the lungs every 6 (six) hours as needed for wheezing or shortness of breath. 09/20/22   Junie Spencer, FNP  apixaban (ELIQUIS) 5 MG TABS tablet Take 1 tablet (5 mg total) by mouth 2 (two) times daily. 05/06/22   Jacklynn Ganong, FNP  carvedilol (COREG) 3.125 MG tablet Take 1 tablet by mouth twice daily 01/02/23   Jacklynn Ganong, FNP  cetirizine (ZYRTEC) 10 MG tablet Take 1 tablet by mouth daily.    [provider]  eplerenone (INSPRA) 25 MG tablet TAKE 1 TABLET BY MOUTH ONCE DAILY. 12/07/22    Perlie Gold, PA-C  ezetimibe (ZETIA) 10 MG tablet Take 1 tablet (10 mg total) by mouth daily. 01/23/23   Meriam Sprague, MD  famotidine (PEPCID) 20 MG tablet One after supper 11/28/22   Nyoka Cowden, MD  levothyroxine (SYNTHROID) 75 MCG tablet Take 1 tablet (75 mcg total) by mouth daily before breakfast. 05/17/22   Delynn Flavin M, DO  pantoprazole (PROTONIX) 40 MG tablet Take 1 tablet (40 mg total) by mouth daily. Take 30-60 min before first meal of the day 11/28/22   Nyoka Cowden, MD  sacubitril-valsartan (ENTRESTO) 49-51 MG Take 1 tablet by mouth 2 (two) times daily. 02/06/23   Meriam Sprague, MD  magic mouthwash (nystatin, lidocaine, diphenhydrAMINE, alum & mag hydroxide) suspension Swish and spit 5 mLs 4 (four) times daily as needed for mouth pain. 12/12/22   Valentino Nose, NP      Allergies    Avelox [moxifloxacin hcl in nacl], Clindamycin/lincomycin, Kiwi extract, Bactrim [sulfamethoxazole-trimethoprim], Levofloxacin, Simvastatin, Spironolactone, Moxifloxacin hcl, Rosuvastatin, and Tramadol    Review of Systems   Review of Systems  Physical Exam Updated Vital Signs BP 113/79   Pulse 63   Temp 97.8 F (36.6 C)   Resp (!) 21   Ht 5\' 7"  (1.702 m)   Wt 97.1 kg   SpO2 100%   BMI 33.53 kg/m  Physical Exam Constitutional:  General: She is not in acute distress. HENT:     Head: Normocephalic and atraumatic.  Eyes:     Conjunctiva/sclera: Conjunctivae normal.     Pupils: Pupils are equal, round, and reactive to light.  Cardiovascular:     Rate and Rhythm: Normal rate and regular rhythm.  Pulmonary:     Effort: Pulmonary effort is normal. No respiratory distress.  Abdominal:     General: There is no distension.     Tenderness: There is no abdominal tenderness.  Skin:    General: Skin is warm and dry.  Neurological:     General: No focal deficit present.     Mental Status: She is alert. Mental status is at baseline.  Psychiatric:        Mood and  Affect: Mood normal.        Behavior: Behavior normal.     ED Results / Procedures / Treatments   Labs (all labs ordered are listed, but only abnormal results are displayed) Labs Reviewed  COMPREHENSIVE METABOLIC PANEL - Abnormal; Notable for the following components:      Result Value   Glucose, Bld 108 (*)    Creatinine, Ser 1.14 (*)    AST 12 (*)    GFR, Estimated 54 (*)    All other components within normal limits  CBC - Abnormal; Notable for the following components:   WBC 10.8 (*)    Hemoglobin 11.9 (*)    All other components within normal limits  URINALYSIS, ROUTINE W REFLEX MICROSCOPIC - Abnormal; Notable for the following components:   Color, Urine COLORLESS (*)    Leukocytes,Ua TRACE (*)    All other components within normal limits  SARS CORONAVIRUS 2 BY RT PCR  BRAIN NATRIURETIC PEPTIDE  LIPASE, BLOOD  TROPONIN I (HIGH SENSITIVITY)    EKG EKG Interpretation  Date/Time:  Sunday Mar 12 2023 19:56:05 EDT Ventricular Rate:  59 PR Interval:  204 QRS Duration: 108 QT Interval:  416 QTC Calculation: 413 R Axis:   19 Text Interpretation: Sinus rhythm Low voltage, precordial leads Confirmed by Alvester Chou [phone]) on 03/12/2023 7:58:54 PM  Radiology CT ABDOMEN PELVIS W CONTRAST  Result Date: 03/12/2023 CLINICAL DATA:  Colitis. EXAM: CT ABDOMEN AND PELVIS WITH CONTRAST TECHNIQUE: Multidetector CT imaging of the abdomen and pelvis was performed using the standard protocol following bolus administration of intravenous contrast. RADIATION DOSE REDUCTION: This exam was performed according to the departmental dose-optimization program which includes automated exposure control, adjustment of the mA and/or kV according to patient size and/or use of iterative reconstruction technique. CONTRAST:  OMNIPAQUE IOHEXOL 300 MG/ML  SOLN COMPARISON:  None Available. FINDINGS: Lower chest: The visualized lung bases are clear. No intra-abdominal free air or free fluid.  Hepatobiliary: No focal liver abnormality is seen. No gallstones, gallbladder wall thickening, or biliary dilatation. Pancreas: Unremarkable. No pancreatic ductal dilatation or surrounding inflammatory changes. Spleen: Normal in size without focal abnormality. Adrenals/Urinary Tract: The adrenal glands are unremarkable. There is no hydronephrosis on either side. There is symmetric enhancement and excretion of contrast by both kidneys. The visualized ureters and urinary bladder appear unremarkable. Stomach/Bowel: There is moderate stool throughout the colon. There is no bowel obstruction or active inflammation. The appendix is normal. Vascular/Lymphatic: The abdominal aorta and IVC are unremarkable. No portal venous gas. There is no adenopathy. Reproductive: The uterus is anteverted. Mildly thickened appearance of the endometrium measuring up to 5-6 mm. This is not evaluated by CT. Further evaluation with ultrasound on a  nonemergent/outpatient basis recommended. No adnexal masses. Other: None Musculoskeletal: No acute or significant osseous findings. IMPRESSION: 1. No acute intra-abdominal or pelvic pathology. 2. Moderate colonic stool burden. No bowel obstruction. Normal appendix. 3. Mildly thickened appearance of the endometrium. Further evaluation with ultrasound on a nonemergent/outpatient basis recommended. Electronically Signed   By: Elgie Collard M.D.   On: 03/12/2023 21:43    Procedures Procedures    Medications Ordered in ED Medications  sodium chloride 0.9 % bolus 500 mL (0 mLs Intravenous Stopped 03/12/23 2136)  iohexol (OMNIPAQUE) 300 MG/ML solution 100 mL (100 mLs Intravenous Contrast Given 03/12/23 2038)    ED Course/ Medical Decision Making/ A&P Clinical Course as of 03/12/23 2327  Sun Mar 12, 2023  2201 Patient reassessed and I discussed her CT findings, including constipation and endometrial thickening.  I do not see any emergency causes of her current symptoms.  She has a PCP  follow-up scheduled in 2 days.  I think it is reasonable to discharge her, recommending a bowel cleanout, she has MiraLAX ready at home.  She will also continue with her PPI and her Pepcid. [MT]    Clinical Course User Index [MT] Herold Salguero, Kermit Balo, MD                             Medical Decision Making Amount and/or Complexity of Data Reviewed Labs: ordered. Radiology: ordered. ECG/medicine tests: ordered.  Risk Prescription drug management.   This patient presents to the ED with concern for fatigue, nausea, abdominal discomfort. This involves an extensive number of treatment options, and is a complaint that carries with it a high risk of complications and morbidity.  The differential diagnosis includes reflux versus biliary disease versus pancreatitis versus colitis versus other  I ordered and personally interpreted labs.  The pertinent results include: No emergent findings.  Labs are near baseline levels.  Troponin undetectable.  BNP within normal limits.  Hgb near baseline level  I ordered imaging studies including CT abdomen pelvis I independently visualized and interpreted imaging which showed moderate stool burden and endometrial thickening without any other acute findings I agree with the radiologist interpretation  The patient was maintained on a cardiac monitor.  I personally viewed and interpreted the cardiac monitored which showed an underlying rhythm of: Sinus rhythm  Per my interpretation the patient's ECG shows no acute ischemic findings  I ordered medication including small fluid bolus for hydration, with some increase of the patient's creatinine and BUN level from ED visit 4 days ago, as well as some poor oral intake with water at home.  I have reviewed the patients home medicines and have made adjustments as needed  Test Considered: Low suspicion for acute PE.  Patient does not have hypoxia or tachycardia.  Oxygen levels 100%.  Low suspicion for new or developing  pneumonia.  Patient's x-rays on 03/08/23 no evidence of infection.   After the interventions noted above, I reevaluated the patient and found that they have: stayed the same   Dispostion:  After consideration of the diagnostic results and the patients response to treatment, I feel that the patent would benefit from close outpatient follow-up.  At this time, given the patient's workup, I have not identified any emergency medical conditions to warrant hospitalization.  Her vital signs have remained stable.  Her labs are unremarkable.  I do think she is stable for discharge with her PCP.  I explained this may be an ongoing  viral syndrome, I would anticipate if that is the case she would begin feeling better within the next 2 to 3 days.  But I also recommended a bowel cleanout.  Return precautions were discussed with the patient and her daughter..         Final Clinical Impression(s) / ED Diagnoses Final diagnoses:  Constipation, unspecified constipation type  Endometrial thickening on ultrasound  Other fatigue  Poor appetite    Rx / DC Orders ED Discharge Orders     None         Terald Sleeper, MD 03/12/23 2328

## 2023-03-12 NOTE — Discharge Instructions (Signed)
Keep yourself hydrated at home and continue with your typical medications.  Please follow-up with your primary care clinic this week as scheduled.  I recommend a bowel cleanout as you do have some constipation in your colon.   Your CT scan also shows that you have some mild thickening of your endometrium, which is part of your uterus.  We would recommend discussing this with your primary care doctor as he may benefit from an ultrasound to take a closer look at your uterus and your pelvis.  This ultrasound would be a way to screen for potential cancers or other serious issues.

## 2023-03-12 NOTE — ED Triage Notes (Signed)
Patient here POV from Home.  Endorses Fatigue and Nausea for approximately 1 Week. Seen 4 Days ago for a Cough and was discharged. Cough has improved but nausea   No Emesis. No Diarrhea. No Known fevers.   NAD Noted during Triage. A&Ox4. GCS 15. BIB Wheelchair.

## 2023-03-13 ENCOUNTER — Other Ambulatory Visit: Payer: Self-pay | Admitting: Internal Medicine

## 2023-03-13 NOTE — Progress Notes (Signed)
Remote ICD transmission.   

## 2023-03-14 ENCOUNTER — Telehealth: Payer: Medicaid Other | Admitting: Family Medicine

## 2023-03-14 DIAGNOSIS — K5900 Constipation, unspecified: Secondary | ICD-10-CM | POA: Diagnosis not present

## 2023-03-14 DIAGNOSIS — R9389 Abnormal findings on diagnostic imaging of other specified body structures: Secondary | ICD-10-CM

## 2023-03-14 DIAGNOSIS — E890 Postprocedural hypothyroidism: Secondary | ICD-10-CM

## 2023-03-14 MED ORDER — LINACLOTIDE 72 MCG PO CAPS
72.0000 ug | ORAL_CAPSULE | Freq: Every day | ORAL | 0 refills | Status: DC
Start: 2023-03-14 — End: 2023-03-27

## 2023-03-14 NOTE — Progress Notes (Signed)
MyChart Video visit  Subjective: CC:ER follow up PCP: [name], DO [name] [name] is a 62 y.o. female. Patient provides verbal consent for consult held via video.  Due to COVID-19 pandemic this visit was conducted virtually. This visit type was conducted due to national recommendations for restrictions regarding the COVID-19 Pandemic (e.g. social distancing, sheltering in place) in an effort to limit this patient's exposure and mitigate transmission in our community. All issues noted in this document were discussed and addressed.  A physical exam was not performed with this format.   Location of patient: home Location of provider: [facility] Others present for call: none  1. ER follow up Was told she had constipation and she had a small bowel movement a couple of hours ago.  Does not feel like she evacuated her bowels.  She is using the Miralax but really has not had what she feels to be appropriate bowel movements.  She continues to have some pressure in her chest and feel nauseated despite use of prescription antiemetics.  She had imaging done in the ER and was found to have thickened endometrium.  She has had no vaginal spotting or bleeding but is having hot flashes.  She sees Dr. Ernestina Penna with [facility] and last visit was last year with normal checkup.   ROS: Per HPI  Allergies  Allergen Reactions   Avelox [Moxifloxacin Hcl In Nacl] Shortness Of Breath    Shortness of breath   Clindamycin/Lincomycin Shortness Of Breath and Diarrhea    Breathing/diarrhea     Kiwi Extract Anaphylaxis    Makes pt. Feel like her throat is closing    Bactrim [Sulfamethoxazole-Trimethoprim] Swelling   Levofloxacin Other (See Comments)    JOINT PAIN JOINT PAIN   Simvastatin Other (See Comments)    Joint pain   Spironolactone Other (See Comments)    burning   Moxifloxacin Hcl Other (See Comments)   Rosuvastatin     Muscle cramps   Tramadol Nausea Only   Past Medical History:   Diagnosis Date   AICD (automatic cardioverter/defibrillator) present 09   [employer]- replaced 2016   Arthritis    Bradycardia    Breast cancer (HCC) 02/24/2016   s/p lumpectomy and radiation but refused chemotherapy   Chronic systolic CHF (congestive heart failure) (HCC)    Cough due to angiotensin-converting enzyme inhibitor    Dizziness    Dizziness with standing after squatting, March, 2012   Dyslipidemia    LDL elevation,Patient does not want statin   Family history of breast cancer    GERD (gastroesophageal reflux disease)    H/O shortness of breath    CPX 10/18:  No evidence of cardiopulmonary limitation. Suspect exercise intolerance related to weight and deconditioning.    Hypothyroidism    ICD (implantable cardiac defibrillator) battery depletion    Dr. Graciela Husbands, June, 2009(artifactual atrial tachycardia response events addressed by reprogramming in parentheses   Leg swelling    right leg   Mitral regurgitation    Nonischemic cardiomyopathy (HCC)    Etiology unknown, diagnosed 2008   Paroxysmal atrial fibrillation (HCC) 06/20/2022   Personal history of radiation therapy 2017   Pneumonia due to COVID-19 virus 10/2019   TIA (transient ischemic attack)    No CT or MRI abnormality, aspirin therapy    Current Outpatient Medications:    acetaminophen (TYLENOL) 500 MG tablet, Take 500 mg by mouth every 6 (six) hours as needed for mild pain or moderate pain., Disp: , Rfl:  albuterol (VENTOLIN HFA) 108 (90 Base) MCG/ACT inhaler, Inhale 2 puffs into the lungs every 6 (six) hours as needed for wheezing or shortness of breath., Disp: 8 g, Rfl: 0   apixaban (ELIQUIS) 5 MG TABS tablet, Take 1 tablet (5 mg total) by mouth 2 (two) times daily., Disp: 180 tablet, Rfl: 2   carvedilol (COREG) 3.125 MG tablet, Take 1 tablet by mouth twice daily, Disp: 180 tablet, Rfl: 0   cetirizine (ZYRTEC) 10 MG tablet, Take 1 tablet by mouth daily., Disp: , Rfl:    eplerenone (INSPRA) 25 MG  tablet, TAKE 1 TABLET BY MOUTH ONCE DAILY., Disp: 90 tablet, Rfl: 3   ezetimibe (ZETIA) 10 MG tablet, Take 1 tablet (10 mg total) by mouth daily., Disp: 90 tablet, Rfl: 3   famotidine (PEPCID) 20 MG tablet, One after supper, Disp: 30 tablet, Rfl: 11   levothyroxine (SYNTHROID) 75 MCG tablet, Take 1 tablet (75 mcg total) by mouth daily before breakfast., Disp: 30 tablet, Rfl: 12   pantoprazole (PROTONIX) 40 MG tablet, Take 1 tablet (40 mg total) by mouth daily. Take 30-60 min before first meal of the day, Disp: 30 tablet, Rfl: 2   sacubitril-valsartan (ENTRESTO) 49-51 MG, Take 1 tablet by mouth 2 (two) times daily., Disp: 60 tablet, Rfl: 3  Gen: Nontoxic female.  Appears stressed when talking about current health situation HEENT: No exophthalmos appreciated with visual inspection  Assessment/ Plan: 62 y.o. female   Constipation, unspecified constipation type - Plan: linaclotide (LINZESS) 72 MCG capsule  Abnormal finding present on diagnostic imaging of uterus - Plan: US Pelvic Complete With Transvaginal  Post-surgical hypothyroidism - Plan: TSH, T4, free  Future orders placed to further evaluate hot flashes.  Stat pelvic ultrasound also ordered to further evaluate thickened endometrium.  I encouraged her to contact Dr. Dayna Barker office to see if she can get an appointment ASAP for follow-up on this potential endometrial biopsy.  If her insurance no longer covers [facility], I am glad to refer her locally but we discussed this may delay care.  Linzess samples provided for constipation.  She will start with 72 mcg and if she is not having success she may advance up to 290 mcg.  Each of the sample boxes were provided to her.   Start time: 1:10pm (link sent and LVM); 1:23pm (second link sent and left second VM); 2:54p End time: 3:09pm  Total time spent on patient care (including video visit/ documentation): 20 minutes  Breyona Swander Hulen Skains, DO [facility] [phone]

## 2023-03-15 ENCOUNTER — Ambulatory Visit (HOSPITAL_COMMUNITY)
Admission: RE | Admit: 2023-03-15 | Discharge: 2023-03-15 | Disposition: A | Discharge status: Home / Self Care | Payer: Medicaid Other | Source: Ambulatory Visit | Attending: Family Medicine | Admitting: Family Medicine

## 2023-03-15 ENCOUNTER — Encounter: Payer: Self-pay | Admitting: Family Medicine

## 2023-03-15 DIAGNOSIS — R9389 Abnormal findings on diagnostic imaging of other specified body structures: Secondary | ICD-10-CM | POA: Diagnosis not present

## 2023-03-15 DIAGNOSIS — N8500 Endometrial hyperplasia, unspecified: Secondary | ICD-10-CM | POA: Diagnosis not present

## 2023-03-16 ENCOUNTER — Encounter: Payer: Self-pay | Admitting: Family Medicine

## 2023-03-17 ENCOUNTER — Other Ambulatory Visit: Payer: Self-pay | Admitting: *Deleted

## 2023-03-17 DIAGNOSIS — R9389 Abnormal findings on diagnostic imaging of other specified body structures: Secondary | ICD-10-CM

## 2023-03-20 ENCOUNTER — Encounter: Payer: Self-pay | Admitting: Internal Medicine

## 2023-03-21 ENCOUNTER — Other Ambulatory Visit: Payer: Medicaid Other

## 2023-03-21 DIAGNOSIS — E890 Postprocedural hypothyroidism: Secondary | ICD-10-CM | POA: Diagnosis not present

## 2023-03-21 LAB — TSH

## 2023-03-21 NOTE — Telephone Encounter (Signed)
Will need to see  next available  - we are opening up the afternoon slots at 130 you can use one of those if nothing else available

## 2023-03-22 LAB — T4, FREE: Free T4: 1.39 ng/dL (ref 0.82–1.77)

## 2023-03-27 ENCOUNTER — Ambulatory Visit: Payer: Medicare Other | Admitting: Internal Medicine

## 2023-03-27 ENCOUNTER — Encounter: Payer: Self-pay | Admitting: Internal Medicine

## 2023-03-27 ENCOUNTER — Telehealth: Payer: Self-pay | Admitting: Internal Medicine

## 2023-03-27 VITALS — BP 86/58 | HR 83 | Ht 67.0 in | Wt 221.0 lb

## 2023-03-27 DIAGNOSIS — R058 Other specified cough: Secondary | ICD-10-CM

## 2023-03-27 MED ORDER — PANTOPRAZOLE SODIUM 40 MG PO TBEC
40.0000 mg | DELAYED_RELEASE_TABLET | Freq: Every day | ORAL | 2 refills | Status: DC
Start: 2023-03-27 — End: 2023-11-27

## 2023-03-27 MED ORDER — FAMOTIDINE 20 MG PO TABS
ORAL_TABLET | ORAL | 11 refills | Status: DC
Start: 2023-03-27 — End: 2023-11-27

## 2023-03-27 MED ORDER — VALSARTAN 80 MG PO TABS
80.0000 mg | ORAL_TABLET | Freq: Two times a day (BID) | ORAL | 11 refills | Status: DC
Start: 2023-03-27 — End: 2024-04-22

## 2023-03-27 MED ORDER — METHYLPREDNISOLONE ACETATE 80 MG/ML IJ SUSP
120.0000 mg | Freq: Once | INTRAMUSCULAR | Status: AC
Start: 2023-03-27 — End: 2023-03-27
  Administered 2023-03-27: 120 mg via INTRAMUSCULAR

## 2023-03-27 NOTE — Telephone Encounter (Signed)
ATC pt LVM for her to call office back to schedule appt w/ MW

## 2023-03-27 NOTE — Patient Instructions (Addendum)
For drainage / throat tickle try take CHLORPHENIRAMINE  4 mg  ("Allergy Relief" 4mg   at [facility] should be easiest to find in the blue box usually on bottom shelf)  take one every 4 hours as needed - extremely effective and inexpensive over the counter- may cause drowsiness so start with just a dose or two an hour before bedtime and see how you tolerate it before trying in daytime.   Stop entresto today and replace it with valsartan 80 mg one twice daily and ok to take extra if you fine your top number is over 110   Resume Pantoprazole (protonix) 40 mg   Take  30-60 min before first meal of the day and Pepcid (famotidine)  20 mg after supper until return to office - this is the best way to tell whether stomach acid is contributing to your problem.    Depomedrol 120 mg IM today   Please schedule a follow up office visit in 4 weeks, sooner if needed  with all medications /inhalers/ solutions in hand so we can verify exactly what you are taking. This includes all medications from all doctors and over the counters

## 2023-03-27 NOTE — Addendum Note (Signed)
Addended by: Jaynee Eagles on: 03/27/2023 03:45 PM   Modules accepted: Orders

## 2023-03-27 NOTE — Progress Notes (Signed)
[name] [name], female    DOB: [birth year]   MRN: [id number]   Brief patient profile:  40 yobf never smoker f/b Shari Prows and Graciela Husbands for cardiac problems since 2007  referred to pulmonary clinic 11/28/2022 by Emelia Salisbury for "allergies" about the same time covid became prevalent and since then 2 documented covid infections the first one in Jan 2021 last one in Oct 2023.    Notes indicate ACEi cough in 2015 per Dr Myrtis Ser   Dx R dvt PE p covid and recurred and none  since placed on eliquis "indefinitely"   History of Present Illness  11/28/2022  Pulmonary/ 1st office eval/[name] [name] sensation of globus x years/ placed on Entresto 12/2015  Chief Complaint  Patient presents with   Consult    Follow up from ED visit.  Chest pressure and cough.  COVID October 2023.  Hx of pulmonary embolism.  Negative with this visit.  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   GERD diet reviewed, bed blocks rec   I will send Dr Shari Prows a message to ask to stop the Entresto  x 4-6 weeks and I will see you in 5 weeks [place] > helped some, ? No worse p started back on entresto though     02/06/2023  f/u ov/[place] office/[name] [name] re: cough since covid  maint on gerd rx  and zyrtec  Chief Complaint  Patient presents with   Follow-up    Pt f/u states that her breathing is fine   Dyspnea:  tired, not limited by breathing  Cough: worse at hs does not keep her up  and recurs p stirring in am/ using cinnamon to control  Sleeping: flat bed / flat pillow   SABA use: not using  02: oxygen  Rec For drainage / throat tickle try take CHLORPHENIRAMINE  4 mg     Strongly recommend bed blocks x 6-8 inches   Please remember to go to the lab department   for your tests - we will call you with the results when they are available.  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 Verbalized she has had 2 CXR that were "normal"   Dyspnea:  Not limited by breathing from desired activities   Cough: non stop, assoc with midline cp with cough  Sleeping: flat bed/ 2 pillows/ L side more sob  SABA use: not helping  02: none    No obvious day to day or daytime variability or assoc excess/ purulent sputum or mucus plugs or hemoptysis or chest tightness, subjective wheeze or overt sinus or hb symptoms.  Also denies any obvious fluctuation of symptoms with weather or environmental changes or other aggravating or alleviating factors except as outlined above   No unusual exposure hx or h/o childhood pna/ asthma or knowledge of premature birth.  Current Allergies, Complete Past Medical History, Past Surgical History, Family History, and Social History were reviewed in [employer] record.  ROS  The following are not active complaints unless bolded Hoarseness, sore throat, dysphagia, dental problems, itching, sneezing,  nasal congestion or discharge of excess mucus or purulent secretions, ear ache,   fever, chills, sweats, unintended wt loss or wt gain, classically pleuritic or exertional cp,  orthopnea pnd or arm/hand swelling  or leg swelling, presyncope, palpitations, abdominal pain, anorexia, nausea, vomiting, diarrhea  or change in bowel habits or change in bladder habits,  change in stools or change in urine, dysuria, hematuria,  rash, arthralgias, visual complaints, headache, numbness, weakness or ataxia or problems with walking or coordination,  change in mood or  memory.        Current Meds  Medication Sig   acetaminophen (TYLENOL) 500 MG tablet Take 500 mg by mouth every 6 (six) hours as needed for mild pain or moderate pain.   albuterol (VENTOLIN HFA) 108 (90 Base) MCG/ACT inhaler Inhale 2 puffs into the lungs every 6 (six) hours as needed for wheezing or shortness of breath.   apixaban (ELIQUIS) 5 MG TABS tablet Take 1 tablet (5 mg total) by mouth 2 (two) times daily.   carvedilol (COREG) 3.125 MG tablet Take 1 tablet by mouth twice daily   eplerenone (INSPRA) 25 MG tablet TAKE 1 TABLET BY MOUTH ONCE DAILY.   levothyroxine (SYNTHROID) 75 MCG tablet Take 1 tablet (75 mcg total) by mouth daily before breakfast.   omeprazole (PRILOSEC) 40 MG capsule Take 40 mg by mouth daily.   sacubitril-valsartan (ENTRESTO) 49-51 MG Take 1 tablet by mouth 2 (two) times daily.              .           Past Medical History:  Diagnosis Date   AICD (automatic cardioverter/defibrillator) present 09   [employer]- replaced 2016   Arthritis    Bradycardia    Breast cancer (HCC) 02/24/2016   s/p lumpectomy and radiation but refused chemotherapy   Chronic systolic CHF (congestive heart failure) (HCC)    Cough due to angiotensin-converting enzyme inhibitor    Dizziness    Dizziness with standing after squatting, March, 2012   Dyslipidemia    LDL elevation,Patient does not want statin   Family history of breast cancer    GERD (gastroesophageal reflux disease)    H/O shortness of breath    CPX 10/18:  No evidence of cardiopulmonary limitation. Suspect exercise intolerance related to weight and deconditioning.    Hypothyroidism    ICD (implantable cardiac defibrillator) battery depletion    Dr. Graciela Husbands, June, 2009(artifactual atrial tachycardia response events  addressed by reprogramming in parentheses   Leg swelling    right leg   Mitral regurgitation    Nonischemic cardiomyopathy (HCC)    Etiology unknown, diagnosed 2008   Paroxysmal atrial fibrillation (HCC) 06/20/2022   Personal history of radiation therapy 2017   Pneumonia due to COVID-19 virus 10/2019   TIA (transient ischemic attack)    No CT or MRI abnormality, aspirin therapy       Objective:    Wts  03/27/2023         221   02/06/23 214  lb 12.8 oz (97.4 kg)  01/20/23 218 lb 12.8 oz (99.2 kg)  12/20/22 219 lb (99.3 kg)    Vital signs reviewed  03/27/2023  - Note at rest 02 sats  97% on RA   General appearance:    somber wf with dry upper airway pattern cough    HEENT : Oropharynx  clear      Nasal turbinates nl    NECK :  without  apparent JVD/ palpable Nodes/TM    LUNGS: no acc muscle use,  Nl contour chest which is clear to A and P bilaterally without cough on insp or exp maneuvers   CV:  RRR  no s3 or murmur or increase in P2, and no edema   ABD:  soft and nontender    MS:  Nl gait/ ext warm without deformities Or obvious joint restrictions  calf tenderness, cyanosis or clubbing    SKIN: warm and dry without lesions    NEURO:  alert, approp, nl sensorium with  no motor or cerebellar deficits apparent.            I personally reviewed images and agree with radiology impression as follows:  CXR:   pa and lateral  No active cardiopulmonary disease. / no cm/chf     Assessment

## 2023-03-27 NOTE — Assessment & Plan Note (Addendum)
Onset in 2015 while on ACEi - Entresto added 2017 with onset of "allergies" around 2020 - 11/28/2022 rec trial off entresto  and on Valsartan max doses  plus max gerd rx >>>helped some - Allergy screen 02/06/2023 >  Eos 0.1 /  IgE  116 - 02/06/2023 added 1st gen H1 blockers per guidelines   - 03/27/2023 try back off Entresto and on valsartan 80 mb bid/ max gerd and  1st gen H1 blockers per guidelines  plus depo 120 and f/u in 4 weeks   Upper airway cough syndrome (previously labeled PNDS),  is so named because it's frequently impossible to sort out how much is  CR/sinusitis with freq throat clearing (which can be related to primary GERD)   vs  causing  secondary (" extra esophageal")  GERD from wide swings in gastric pressure that occur with throat clearing, often  promoting self use of mint and menthol lozenges that reduce the lower esophageal sphincter tone and exacerbate the problem further in a cyclical fashion.   These are the same pts (now being labeled as having "irritable larynx syndrome" by some cough centers) who not infrequently have a history of having failed to tolerate ace inhibitors and entresto/   dry powder inhalers or biphosphonates or report having atypical/extraesophageal reflux symptoms that don't respond to standard doses of PPI  and are easily confused as having aecopd or asthma flares by even experienced allergists/ pulmonologists (myself included).   Rec Resume gerd rx Depomedrol 120 mg IM 1st gen H1 blockers per guidelines  in max doses  Change entresto again back to valsartan 80 mg bid with baseline bnp = 38 on 03/08/23  and no reported doe at all   Advised pt: The standardized cough guidelines published in Chest by [place] in 2006 are still the best available and consist of a multiple step process (up to 12!) , not a single office visit,  and are intended  to address this problem logically,  with an alogrithm dependent on response to empiric treatment at  each progressive  step  to determine a specific diagnosis with  minimal addtional testing needed. Therefore if adherence is an issue or can't be accurately verified,  it's very unlikely the standard evaluation and treatment will be successful here.    Furthermore, response to therapy (other than acute cough suppression, which should only be used short term with avoidance of narcotic containing cough syrups if possible), can be a gradual process for which the patient is not likely to  perceive immediate benefit.  Unlike going to an eye doctor where the best perscription is almost always the first one and is immediately effective, this is almost never the case in the management of chronic cough syndromes. Therefore the patient needs to commit up front to consistently adhere to recommendations  for up to 4-6 weeks of therapy directed at the likely underlying problem(s) before the response can be reasonably evaluated.          Each maintenance medication was reviewed in detail including emphasizing most importantly the difference between maintenance and prns and under what circumstances the prns are to be triggered using an action plan format where appropriate.  Total time for H and P, chart review, counseling, reviewing hfa device(s) and generating customized AVS unique to this office visit / same day charting  > 30 min for  efractory respiratory  symptoms of uncertain etiology

## 2023-03-28 NOTE — Telephone Encounter (Signed)
Pt was seen in clinic 6/3 by MW - care provided for pt complaint. NFN att.

## 2023-03-31 ENCOUNTER — Encounter: Payer: Self-pay | Admitting: Obstetrics & Gynecology

## 2023-03-31 ENCOUNTER — Other Ambulatory Visit (HOSPITAL_COMMUNITY): Payer: Self-pay | Admitting: Family Medicine

## 2023-03-31 ENCOUNTER — Encounter: Payer: Self-pay | Admitting: Cardiology

## 2023-03-31 ENCOUNTER — Other Ambulatory Visit: Payer: Self-pay | Admitting: Obstetrics & Gynecology

## 2023-03-31 ENCOUNTER — Ambulatory Visit: Payer: Medicare Other | Admitting: Obstetrics & Gynecology

## 2023-03-31 VITALS — BP 124/79 | HR 65 | Ht 67.0 in | Wt 218.0 lb

## 2023-03-31 DIAGNOSIS — R9389 Abnormal findings on diagnostic imaging of other specified body structures: Secondary | ICD-10-CM

## 2023-03-31 MED ORDER — ROSUVASTATIN CALCIUM 5 MG PO TABS: 5.0000 mg | ORAL_TABLET | Freq: Every day | ORAL | 2 refills | Status: DC

## 2023-03-31 NOTE — Progress Notes (Signed)
GYN VISIT Patient name: [name] [name] MRN [id number]  Date of birth: 1961/07/28 Chief Complaint:   Endometrial biopsy (Thickening on ultrasound)  History of Present Illness:   [name] [name] is a 62 y.o. G2P0 PM female being seen today for the following concerns:  Thickened endometrium: Initially pt had noted some nausea/fatigue. Work up was done including CT that showed a thickened endometrial lining.  Recommendation for follow-up pelvic ultrasound was then also completed -Ultrasound; 02/2021/2024; 5.4 x 3.8 x 4 cm uterus.  Endometrium: 7.66m  Denies vaginal bleeding, discharge.  Denis pelvic pain or abdominal pain.  Denies bloating.  Patient reports no acute GYN concerns  No LMP recorded. Patient is postmenopausal.    Review of Systems:   Pertinent items are noted in HPI Denies fever/chills, dizziness, headaches, visual disturbances, fatigue, shortness of breath, chest pain, abdominal pain, vomiting.  Pertinent History Reviewed:   Past Surgical History:  Procedure Laterality Date   BREAST BIOPSY Left 02/24/2016   U/S Core   BREAST BIOPSY Left 02/24/2016   U/S Core   BREAST LUMPECTOMY Left 04/05/2016   invasive ductal    BREAST LUMPECTOMY WITH RADIOACTIVE SEED AND SENTINEL LYMPH NODE BIOPSY Left 04/05/2016   Procedure: BREAST LUMPECTOMY WITH RADIOACTIVE SEED AND SENTINEL LYMPH NODE BIOPSY;  Surgeon: [name], MD;  Location: [facility] OR;  Service: General;  Laterality: Left;   CARDIAC CATHETERIZATION N/A 08/05/2016   Procedure: Left Heart Cath and Coronary Angiography;  Surgeon: Peter M Swaz[place], MD;  Location: [facility] INVASIVE CV LAB;  Service: Cardiovascular;  Laterality: N/A;   CARDIAC DEFIBRILLATOR PLACEMENT  09   [employer] no remote   COLONOSCOPY     CYST EXCISION Left    back of leg-lt    IMPLANTABLE CARDIOVERTER DEFIBRILLATOR GENERATOR CHANGE N/A 12/29/2014   Procedure: IMPLANTABLE CARDIOVERTER DEFIBRILLATOR GENERATOR CHANGE;  Surgeon: [name], MD;  Location: [facility] CATH  LAB;  Service: Cardiovascular;  Laterality: N/A;   LATERAL EPICONDYLE RELEASE  10/11/2012   Procedure: TENNIS ELBOW RELEASE;  Surgeon: Loreta Ave, MD;  Location: [facility];  Service: Orthopedics;  Laterality: Right;  RIGHT ELBOW: TENOTOMY ELBOW LATERAL EPICONDYLITIS TENNIS ELBOW: RADIAL TUNNEL RELEASE   PORT-A-CATH REMOVAL  04/13/2016   Procedure: MINOR REMOVAL PORT-A-CATH;  Surgeon: Manus Rudd, MD;  Location: [facility];  Service: General;;   PORTACATH PLACEMENT Right 04/05/2016   Procedure: INSERTION PORT-A-CATH WITH [place];  Surgeon: [name], MD;  Location: [facility] OR;  Service: General;  Laterality: Right;  right IJ   TOTAL THYROIDECTOMY  2001   TUBAL LIGATION  94   UPPER GI ENDOSCOPY      Past Medical History:  Diagnosis Date   AICD (automatic cardioverter/defibrillator) present 09   [employer]- replaced 2016   Arthritis    Bradycardia    Breast cancer (HCC) 02/24/2016   s/p lumpectomy and radiation but refused chemotherapy   Chronic systolic CHF (congestive heart failure) (HCC)    Cough due to angiotensin-converting enzyme inhibitor    Dizziness    Dizziness with standing after squatting, March, 2012   Dyslipidemia    LDL elevation,Patient does not want statin   Family history of breast cancer    GERD (gastroesophageal reflux disease)    H/O shortness of breath    CPX 10/18:  No evidence of cardiopulmonary limitation. Suspect exercise intolerance related to weight and deconditioning.    Hypothyroidism    ICD (implantable cardiac defibrillator) battery depletion    Dr. Graciela Husbands, June,  2009(artifactual atrial tachycardia response events addressed by reprogramming in parentheses   Leg swelling    right leg   Mitral regurgitation    Nonischemic cardiomyopathy (HCC)    Etiology unknown, diagnosed 2008   Paroxysmal atrial fibrillation (HCC) 06/20/2022   Personal history of radiation therapy 2017   Pneumonia due to COVID-19 virus 10/2019    TIA (transient ischemic attack)    No CT or MRI abnormality, aspirin therapy   Reviewed problem list, medications and allergies. Physical Assessment:   Vitals:   03/31/23 1019  BP: 124/79  Pulse: 65  Weight: 218 lb (98.9 kg)  Height: 5\' 7"  (1.702 m)  Body mass index is 34.14 kg/m.       Physical Examination:   General appearance: alert, well appearing, and in no distress  Psych: mood appropriate, normal affect  Skin: warm & dry   Cardiovascular: normal heart rate noted  Respiratory: normal respiratory effort, no distress  Abdomen: soft, non-tender   Pelvic: VULVA: normal appearing vulva with no masses, tenderness or lesions, VAGINA: normal appearing vagina with normal color and discharge, no lesions, CERVIX: normal appearing cervix without discharge or lesions, UTERUS: uterus is normal size, shape, consistency and nontender, ADNEXA: normal adnexa in size, nontender and no masses  Extremities: no edema   Chaperone: Jobe Marker    Endometrial Biopsy Procedure Note  Pre-operative Diagnosis: Thickened endometrium  Post-operative Diagnosis: same  Procedure Details  The risks (including infection, bleeding, pain, and uterine perforation) and benefits of the procedure were explained to the patient and Written informed consent was obtained.  Antibiotic prophylaxis against endocarditis was not indicated.   The patient was placed in the dorsal lithotomy position.  Bimanual exam showed the uterus to be in the neutral position.  A speculum inserted in the vagina, and the cervix prepped with betadine.     A single tooth tenaculum was applied to the anterior lip of the cervix for stabilization.  Os finder was used.  A Pipelle endometrial aspirator was used to sample the endometrium.  Sample was sent for pathologic examination.  Condition: Stable  Complications: None    Assessment & Plan:  1) Thickened endometrium -Reviewed incidental findings on ultrasound -Discussed with  patient that while thickening of endometrium may be a sign of cancer, typically endometrial cancer presents with postmenopausal bleeding.  Patient is currently asymptomatic -Discussed options such as conservative monitoring versus endometrial biopsy.  Patient desires endometrial biopsy -EMB obtained, pathology pending  The patient was advised to call for any fever or for prolonged or severe pain or bleeding. She was advised to use OTC analgesics as needed for mild to moderate pain. She was advised to avoid vaginal intercourse for 48 hours or until the bleeding has completely stopped.  Return if symptoms worsen or fail to improve.   Myna Hidalgo, DO Attending Obstetrician & Gynecologist, [facility] for [employer], [facility]

## 2023-03-31 NOTE — Telephone Encounter (Signed)
Meriam Sprague, MD  Loa Socks, LPN We can send her in the 5mg . Sounds like she is taking 2.5mg  if I am reading that right.   See mychart message sent to the pt above the above statement from Dr. Shari Prows.

## 2023-04-04 LAB — ANATOMIC PATHOLOGY REPORT

## 2023-04-04 LAB — SPECIMEN STATUS REPORT

## 2023-04-05 ENCOUNTER — Other Ambulatory Visit: Payer: Self-pay | Admitting: Family Medicine

## 2023-04-05 DIAGNOSIS — Z1231 Encounter for screening mammogram for malignant neoplasm of breast: Secondary | ICD-10-CM

## 2023-04-24 ENCOUNTER — Other Ambulatory Visit (HOSPITAL_COMMUNITY): Payer: Self-pay | Admitting: Family Medicine

## 2023-04-26 ENCOUNTER — Encounter: Payer: Self-pay | Admitting: Cardiology

## 2023-04-26 MED ORDER — CARVEDILOL 3.125 MG PO TABS: 3.1250 mg | ORAL_TABLET | Freq: Two times a day (BID) | ORAL | 3 refills | Status: DC

## 2023-05-03 ENCOUNTER — Encounter: Payer: Self-pay | Admitting: Internal Medicine

## 2023-05-03 ENCOUNTER — Ambulatory Visit (INDEPENDENT_AMBULATORY_CARE_PROVIDER_SITE_OTHER): Payer: Medicare Other | Admitting: Internal Medicine

## 2023-05-03 VITALS — BP 126/80 | HR 62 | Ht 67.0 in | Wt 222.0 lb

## 2023-05-03 DIAGNOSIS — R058 Other specified cough: Secondary | ICD-10-CM

## 2023-05-03 NOTE — Progress Notes (Signed)
[name] [name], female    DOB: Mar 20, 1961   MRN: [id number]   Brief patient profile:  28 yobf never smoker f/b [name] [name] and [name] [name] for cardiac problems since 2007  referred to pulmonary clinic 11/28/2022 by [name] [name] for "allergies" about the same time covid became prevalent and since then 2 documented covid infections the first one in Jan 2021 last one in Oct 2023.    Notes indicate ACEi cough in 2015 per [name] [name]   Dx R dvt PE p covid and recurred and none  since placed on eliquis "indefinitely"   History of Present Illness  11/28/2022  Pulmonary/ 1st office eval/[name] [name] sensation of globus x years/ placed on Entresto 12/2015  Chief Complaint  Patient presents with   Consult    Follow up from ED visit.  Chest pressure and cough.  COVID October 2023.  Hx of pulmonary embolism.  Negative with this visit.  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   GERD diet reviewed, bed blocks rec   I will send [name] [name] [name] a message to ask to stop the Entresto  x 4-6 weeks and I will see you in 5 weeks [place] > helped some, ? No worse p started back on entresto though     02/06/2023  f/u ov/[place] office/[name] [name] re: cough since covid  maint on gerd rx  and zyrtec  Chief Complaint  Patient presents with   Follow-up    Pt f/u states that her breathing is fine   Dyspnea:  tired, not limited by breathing  Cough: worse at hs does not keep her up  and recurs p stirring in am/ using cinnamon to control  Sleeping: flat bed / flat pillow   SABA use: not using  02: oxygen  Rec For drainage / throat tickle try take CHLORPHENIRAMINE  4 mg     Strongly recommend bed blocks x 6-8 inches   Please remember to go to the lab department   for your tests - we will call you with the results when they are available.  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 Verbalized she has had 2 CXR that were "normal"   Dyspnea:  Not limited by breathing from desired activities   Cough: non stop, assoc with midline cp with cough  Sleeping: flat bed/ 2 pillows/ L side more sob  SABA use: not helping  02: none Rec For drainage / throat tickle try take CHLORPHENIRAMINE  4 mg    Stop entresto today and replace it with valsartan 80 mg one twice daily and ok to take extra  if you fine your top number is over 110  Resume Pantoprazole (protonix) 40 mg   Take  30-60 min before first meal of the day and Pepcid (famotidine)  20 mg after supper until return to office  Depomedrol 120 mg IM today  Please schedule a follow up office visit in 4 weeks, sooner if needed  with all medications /inhalers/ solutions in hand     05/03/2023  f/u ov/[place] office/[name] [name] re: UACS maint on GERD Rx  did  bring meds Chief Complaint  Patient presents with   Follow-up    Cough is much improved. She has not used albuterol in the past month.   Dyspnea:  not limited except by back pain  Cough: still present but still some dry  Sleeping: flat bed with pillows no noct cough p h1  SABA use: none x one month    No obvious day to day or daytime variability or assoc excess/ purulent sputum or mucus plugs or hemoptysis or cp or chest tightness, subjective wheeze or overt sinus or hb symptoms.    Also denies  any obvious fluctuation of symptoms with weather or environmental changes or other aggravating or alleviating factors except as outlined above   No unusual exposure hx or h/o childhood pna/ asthma or knowledge of premature birth.  Current Allergies, Complete Past Medical History, Past Surgical History, Family History, and Social History were reviewed in [name] [name] record.  ROS  The following are not active complaints unless bolded Hoarseness, sore throat, dysphagia, dental problems, itching, sneezing,  nasal congestion or discharge of excess mucus or purulent secretions, ear ache,   fever, chills, sweats, unintended wt loss or wt gain, classically pleuritic or exertional cp,  orthopnea pnd or arm/hand swelling  or leg swelling, presyncope, palpitations, abdominal pain, anorexia, nausea, vomiting, diarrhea  or change in bowel habits or change in bladder habits, change in stools or change in urine, dysuria, hematuria,  rash, arthralgias, visual complaints, headache, numbness, weakness or ataxia or problems with walking or coordination,  change in mood or  memory.        Current Meds  Medication Sig   acetaminophen (TYLENOL) 500 MG tablet Take 500 mg by mouth every 6 (six) hours as needed for mild pain or moderate pain.   apixaban (ELIQUIS) 5 MG TABS tablet Take 1 tablet (5 mg total) by mouth 2 (two) times daily. NEEDS FOLLOW UP APPOINTMENT FOR MORE REFILLS   carvedilol (COREG) 3.125 MG tablet Take 1 tablet (3.125 mg total) by mouth 2 (two) times daily. NEEDS FOLLOW UP APPOINTMENT FOR ANYMORE REFILLS   chlorpheniramine (CHLOR-TRIMETON) 4 MG tablet Take 4 mg by mouth every 4 (four) hours as needed for allergies.   eplerenone (INSPRA) 25 MG tablet TAKE 1 TABLET BY MOUTH ONCE DAILY.   famotidine (PEPCID) 20 MG tablet One after supper   levothyroxine (SYNTHROID) 75 MCG tablet Take 1 tablet (75 mcg total) by mouth daily before breakfast.   pantoprazole (PROTONIX) 40 MG tablet Take  1 tablet (40 mg total) by mouth daily. Take 30-60 min before first meal of the day   rosuvastatin (CRESTOR) 5 MG tablet Take 1 tablet (5 mg total) by mouth daily.   valsartan (DIOVAN) 80 MG tablet Take 1 tablet (80 mg total) by mouth 2 (two) times daily.             .           Past Medical History:  Diagnosis Date   AICD (  automatic cardioverter/defibrillator) present 09   [employer]- replaced 2016   Arthritis    Bradycardia    Breast cancer (HCC) 02/24/2016   s/p lumpectomy and radiation but refused chemotherapy   Chronic systolic CHF (congestive heart failure) (HCC)    Cough due to angiotensin-converting enzyme inhibitor    Dizziness    Dizziness with standing after squatting, March, 2012   Dyslipidemia    LDL elevation,Patient does not want statin   Family history of breast cancer    GERD (gastroesophageal reflux disease)    H/O shortness of breath    CPX 10/18:  No evidence of cardiopulmonary limitation. Suspect exercise intolerance related to weight and deconditioning.    Hypothyroidism    ICD (implantable cardiac defibrillator) battery depletion    [name]. Graciela [name], June, 2009(artifactual atrial tachycardia response events addressed by reprogramming in parentheses   Leg swelling    right leg   Mitral regurgitation    Nonischemic cardiomyopathy (HCC)    Etiology unknown, diagnosed 2008   Paroxysmal atrial fibrillation (HCC) 06/20/2022   Personal history of radiation therapy 2017   Pneumonia due to COVID-19 virus 10/2019   TIA (transient ischemic attack)    No CT or MRI abnormality, aspirin therapy       Objective:    Wts 05/03/2023       222  03/27/2023         221   02/06/23 214 lb 12.8 oz (97.4 kg)  01/20/23 218 lb 12.8 oz (99.2 kg)  12/20/22 219 lb (99.3 kg)     Vital signs reviewed  05/03/2023  - Note at rest 02 sats  97% on RA   General appearance:    jovial amb bf,  occ dry  throat clearing    HEENT : Oropharynx  clear     Nasal turbinates nl / ears  canals nl/ nl TMs   NECK :  without  apparent JVD/ palpable Nodes/TM    LUNGS: no acc muscle use,  Nl contour chest which is clear to A and P bilaterally without cough on insp or exp maneuvers   CV:  RRR  no s3 or murmur or increase in P2, and no edema   ABD:  soft and nontender with nl inspiratory excursion in the supine position. No bruits or organomegaly appreciated   MS:  Nl gait/ ext warm without deformities Or obvious joint restrictions  calf tenderness, cyanosis or clubbing    SKIN: warm and dry without lesions    NEURO:  alert, approp, nl sensorium with  no motor or cerebellar deficits apparent.        Assessment

## 2023-05-03 NOTE — Patient Instructions (Addendum)
Try off the Allergy Relief to see what difference it makes  If ringing continues you will need to see ENT (who also can see you for throat tickle)     Please schedule a follow up visit in 3 months but call sooner if needed

## 2023-05-03 NOTE — Assessment & Plan Note (Signed)
Onset in 2015 while on ACEi - Entresto added 2017 with onset of "allergies" around 2020 - 11/28/2022 rec trial off Entresto and on Valsartan max doses  plus max gerd rx >>>helped some - Allergy screen 02/06/2023 >  Eos 0.1 /  IgE  116 - 02/06/2023 added 1st gen H1 blockers per guidelines   - 03/27/2023 try   off Entresto and on valsartan 80 mb bid/baseline bnp = 38 on 03/08/23  and no reported doe at all /  max gerd and  1st gen H1 blockers per guidelines  plus depo 120 and f/u in 4 weeks > much better 05/03/2023 > continue rx x 3 m then regroup  Reminded h1 is prn and that her only other cc = tinnitis is chronic/ recurrent but if still problematic or throat tickle still an issue ENT f/u is next step         Each maintenance medication was reviewed in detail including emphasizing most importantly the difference between maintenance and prns and under what circumstances the prns are to be triggered using an action plan format where appropriate.  Total time for H and P, chart review, counseling, reviewing hfa  device(s) and generating customized AVS unique to this office visit / same day charting = 22 min

## 2023-05-08 ENCOUNTER — Ambulatory Visit: Payer: Medicaid Other | Admitting: Internal Medicine

## 2023-05-08 ENCOUNTER — Encounter: Payer: Self-pay | Admitting: Internal Medicine

## 2023-05-10 LAB — CUP PACEART REMOTE DEVICE CHECK
Battery Remaining Longevity: 48 mo
Battery Remaining Percentage: 60 %
Brady Statistic RA Percent Paced: 0 %
Brady Statistic RV Percent Paced: 0 %
Date Time Interrogation Session: 20240715124600
HighPow Impedance: 53 Ohm
Implantable Lead Connection Status: 753985
Implantable Lead Connection Status: 753985
Implantable Lead Implant Date: 20090320
Implantable Lead Implant Date: 20090320
Implantable Lead Location: 753859
Implantable Lead Location: 753860
Implantable Lead Model: 158
Implantable Lead Model: 5076
Implantable Lead Serial Number: 182504
Implantable Pulse Generator Implant Date: 20160307
Lead Channel Impedance Value: 436 Ohm
Lead Channel Impedance Value: 534 Ohm
Lead Channel Pacing Threshold Amplitude: 0.5 V
Lead Channel Pacing Threshold Amplitude: 1.2 V
Lead Channel Pacing Threshold Pulse Width: 0.4 ms
Lead Channel Pacing Threshold Pulse Width: 0.4 ms
Lead Channel Setting Pacing Amplitude: 2 V
Lead Channel Setting Pacing Amplitude: 2.5 V
Lead Channel Setting Pacing Pulse Width: 0.4 ms
Lead Channel Setting Sensing Sensitivity: 0.6 mV
Pulse Gen Serial Number: 111826

## 2023-05-11 ENCOUNTER — Ambulatory Visit
Admission: RE | Admit: 2023-05-11 | Discharge: 2023-05-11 | Disposition: A | Discharge status: Home / Self Care | Payer: Medicare Other | Source: Ambulatory Visit | Attending: Family Medicine | Admitting: Family Medicine

## 2023-05-11 ENCOUNTER — Ambulatory Visit (INDEPENDENT_AMBULATORY_CARE_PROVIDER_SITE_OTHER): Payer: Medicare Other

## 2023-05-11 DIAGNOSIS — I428 Other cardiomyopathies: Secondary | ICD-10-CM

## 2023-05-11 DIAGNOSIS — Z1231 Encounter for screening mammogram for malignant neoplasm of breast: Secondary | ICD-10-CM

## 2023-05-12 ENCOUNTER — Other Ambulatory Visit: Payer: Self-pay | Admitting: Family Medicine

## 2023-05-12 DIAGNOSIS — N644 Mastodynia: Secondary | ICD-10-CM

## 2023-05-17 ENCOUNTER — Ambulatory Visit: Payer: Medicare Other

## 2023-05-18 ENCOUNTER — Ambulatory Visit
Admission: RE | Admit: 2023-05-18 | Discharge: 2023-05-18 | Disposition: A | Discharge status: Home / Self Care | Payer: Medicare Other | Source: Ambulatory Visit | Attending: Family Medicine | Admitting: Family Medicine

## 2023-05-18 DIAGNOSIS — N644 Mastodynia: Secondary | ICD-10-CM

## 2023-05-22 ENCOUNTER — Encounter: Payer: Self-pay | Admitting: Internal Medicine

## 2023-05-22 NOTE — Telephone Encounter (Signed)
Ov  05/24/23 with all active meds in hand, really nothing else to offer in meantime

## 2023-05-22 NOTE — Telephone Encounter (Signed)
Patient scheduled for OV 05/24/23

## 2023-05-24 ENCOUNTER — Ambulatory Visit (INDEPENDENT_AMBULATORY_CARE_PROVIDER_SITE_OTHER): Payer: Medicare Other | Admitting: Internal Medicine

## 2023-05-24 ENCOUNTER — Encounter: Payer: Self-pay | Admitting: Internal Medicine

## 2023-05-24 VITALS — BP 119/73 | HR 87 | Ht 67.0 in | Wt 220.0 lb

## 2023-05-24 DIAGNOSIS — R058 Other specified cough: Secondary | ICD-10-CM | POA: Diagnosis not present

## 2023-05-24 DIAGNOSIS — I82409 Acute embolism and thrombosis of unspecified deep veins of unspecified lower extremity: Secondary | ICD-10-CM | POA: Insufficient documentation

## 2023-05-24 HISTORY — DX: Acute embolism and thrombosis of unspecified deep veins of unspecified lower extremity: I82.409

## 2023-05-24 MED ORDER — METHYLPREDNISOLONE ACETATE 80 MG/ML IJ SUSP
120.0000 mg | Freq: Once | INTRAMUSCULAR | Status: AC
Start: 2023-05-24 — End: 2023-05-24
  Administered 2023-05-24: 120 mg via INTRAMUSCULAR

## 2023-05-24 NOTE — Progress Notes (Signed)
[name] [name], female    DOB: 26-Apr-1961   MRN: [id number]   Brief patient profile:  45 yobf never smoker f/b [name] [name] and [name] [name] for cardiac problems since 2007  referred to pulmonary clinic 11/28/2022 by Emelia Salisbury for "allergies" about the same time covid became prevalent and since then 2 documented covid infections the first one in Jan 2021 last one in Oct 2023 and since then 24 / 7 globus sensation   Notes indicate ACEi cough in 2015 per Dr Myrtis Ser   Dx R dvt PE p covid and recurred and none  since placed on eliquis "indefinitely"   History of Present Illness  11/28/2022  Pulmonary/ 1st office eval/[name] [name] sensation of globus x years/ placed on Entresto 12/2015  Chief Complaint  Patient presents with   Consult    Follow up from ED visit.  Chest pressure and cough.  COVID October 2023.  Hx of pulmonary embolism.  Negative with this visit.  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   GERD diet reviewed, bed blocks rec   I will send Dr [name] [name] a message to ask to stop the Entresto  x 4-6 weeks and I will see you in 5 weeks [place] > helped some, ? No worse p started back on entresto though    02/06/2023  f/u ov/[place] office/[name] [name] re: cough since covid  maint on gerd rx  and zyrtec  Chief Complaint  Patient presents with   Follow-up    Pt f/u states that her breathing is fine   Dyspnea:  tired, not limited by breathing  Cough: worse at hs does not keep her up  and recurs p stirring in am/ using cinnamon to control   Sleeping: flat bed / flat pillow  SABA use: not using  02: oxygen  Rec For drainage / throat tickle try take CHLORPHENIRAMINE  4 mg     Strongly recommend bed blocks x 6-8 inches   Please remember to go to the lab department   for your tests - we will call you with the results when they are available.  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 Verbalized she has had 2 CXR that were "normal"   Dyspnea:  Not limited by breathing from desired activities   Cough: non stop, assoc with midline cp with cough  Sleeping: flat bed/ 2 pillows/ L side more sob  SABA use: not helping  02: none Rec For drainage / throat tickle try take CHLORPHENIRAMINE  4 mg    Stop entresto today and replace it with valsartan 80 mg one twice  daily and ok to take extra if you fine your top number is over 110  Resume Pantoprazole (protonix) 40 mg   Take  30-60 min before first meal of the day and Pepcid (famotidine)  20 mg after supper until return to office  Depomedrol 120 mg IM today  Please schedule a follow up office visit in 4 weeks, sooner if needed  with all medications /inhalers/ solutions in hand     05/03/2023  f/u ov/[place] office/[name] [name] re: UACS maint on GERD Rx  did  bring meds Chief Complaint  Patient presents with   Follow-up    Cough is much improved. She has not used albuterol in the past month.   Dyspnea:  not limited except by back pain  Cough: still present and dry  Sleeping: flat bed with pillows no noct cough p h1  SABA use: none x one month Rec Try off the Allergy Relief to see what difference it makes If ringing continues you will need to see ENT (who also can see you for throat tickle)   Please schedule a follow  up visit in 3 months but call sooner if needed    05/24/2023  ACUTE  ov/[place] office/[name] [name] re: cough maint on off H1 much worse since stopped with sense of "too much mucus in throat"    Chief Complaint  Patient presents with   Upper airway cough syndrome   Dyspnea:  if anything  this is better since off entresto Cough: mostly dry with one spec of green after lots of cough, some watery nasal d/c  Sleeping: flat bed two pillows w/in 5 min starts coughing off h1  SABA use: albuterol not helping  02: none  Could not tol gabapentin for back pain but not sure what strength was used    No obvious day to day or daytime variability or assoc excess/ purulent sputum or mucus plugs or hemoptysis or cp or chest tightness, subjective wheeze or overt sinus or hb symptoms.     Also denies any obvious fluctuation of symptoms with weather or environmental changes or other aggravating or alleviating factors except as outlined above   No unusual exposure hx or h/o childhood pna/ asthma or knowledge of premature birth.  Current Allergies, Complete Past Medical History, Past Surgical History, Family History, and Social History were reviewed in [employer] record.  ROS  The following are not active complaints unless bolded Hoarseness, sore throat= globus , dysphagia, dental problems, itching, sneezing,  nasal congestion or discharge of excess mucus or purulent secretions, ear ache,   fever, chills, sweats, unintended wt loss or wt gain, classically pleuritic or exertional cp,  orthopnea pnd or arm/hand swelling  or leg swelling, presyncope, palpitations, abdominal pain, anorexia, nausea, vomiting, diarrhea  or change in bowel habits or change in bladder habits, change in stools or change in urine, dysuria, hematuria,  rash, arthralgias, visual complaints, headache, numbness, weakness or ataxia or problems with walking or coordination,  change in mood or  memory.        Current Meds   Medication Sig   acetaminophen (TYLENOL) 500 MG tablet Take 500 mg by mouth every 6 (six) hours as needed for mild pain or moderate pain.   apixaban (ELIQUIS) 5 MG TABS tablet Take 1 tablet (5 mg total) by mouth 2 (two) times daily. NEEDS FOLLOW UP APPOINTMENT FOR MORE REFILLS   carvedilol (COREG) 3.125 MG tablet Take 1 tablet (3.125 mg total) by mouth 2 (two) times daily. NEEDS  FOLLOW UP APPOINTMENT FOR ANYMORE REFILLS   eplerenone (INSPRA) 25 MG tablet TAKE 1 TABLET BY MOUTH ONCE DAILY.   famotidine (PEPCID) 20 MG tablet One after supper   levothyroxine (SYNTHROID) 75 MCG tablet Take 1 tablet (75 mcg total) by mouth daily before breakfast.   pantoprazole (PROTONIX) 40 MG tablet Take 1 tablet (40 mg total) by mouth daily. Take 30-60 min before first meal of the day   rosuvastatin (CRESTOR) 5 MG tablet Take 1 tablet (5 mg total) by mouth daily.   valsartan (DIOVAN) 80 MG tablet Take 1 tablet (80 mg total) by mouth 2 (two) times daily.             Past Medical History:  Diagnosis Date   AICD (automatic cardioverter/defibrillator) present 09   [employer]- replaced 2016   Arthritis    Bradycardia    Breast cancer (HCC) 02/24/2016   s/p lumpectomy and radiation but refused chemotherapy   Chronic systolic CHF (congestive heart failure) (HCC)    Cough due to angiotensin-converting enzyme inhibitor    Dizziness    Dizziness with standing after squatting, March, 2012   Dyslipidemia    LDL elevation,Patient does not want statin   Family history of breast cancer    GERD (gastroesophageal reflux disease)    H/O shortness of breath    CPX 10/18:  No evidence of cardiopulmonary limitation. Suspect exercise intolerance related to weight and deconditioning.    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2 kg)  12/20/22 219 lb (99.3 kg)    Vital signs reviewed  05/24/2023  - Note at rest 02 sats  95% on RA   General appearance:    somber amb bf freq dry sounding throat clearing   HEENT : Oropharynx  pristine - no cobblestoning on pnd     Nasal turbinates nl    NECK :  without  apparent JVD/ palpable Nodes/TM    LUNGS: no acc muscle use,  Nl contour chest which is clear to A and P bilaterally with cough early on insp maneuvers   CV:  RRR  no s3 or murmur or increase in P2, and no edema   ABD:  soft and nontender    MS:  Nl gait/ ext warm without deformities Or obvious joint restrictions  calf tenderness, cyanosis or clubbing    SKIN: warm and dry without lesions    NEURO:  alert, approp, nl sensorium with  no motor or cerebellar deficits apparent.       Assessment

## 2023-05-24 NOTE — Assessment & Plan Note (Signed)
Onset in 2015 while on ACEi/ best rx =  1st gen H1 blockers per guidelines   - Entresto added 2017 with onset of "allergies" around 2020 - 11/28/2022 rec trial off Entresto and on Valsartan max doses  plus max gerd rx >>>helped some - Allergy screen 02/06/2023 >  Eos 0.1 /  IgE  116 - 02/06/2023 added 1st gen H1 blockers per guidelines   - 03/27/2023 try   off Entresto and on valsartan 80 mb bid/baseline bnp = 38 on 03/08/23  and no reported doe at all /  max gerd and  1st gen H1 blockers per guidelines  plus depo 120 and f/u in 4 weeks > much better 05/03/2023 > continue rx x 3 m then regroup - 05/24/2023 cough reported much worse off h1 and tinnitus no better but did not restart H1 and lots of watery nasal d/c off it/ esp bothersome at hs   Clearly this is a form of Upper airway cough syndrome (previously labeled PNDS),  is so named because it's frequently impossible to sort out how much is  CR/sinusitis with freq throat clearing (which can be related to primary GERD)   vs  causing  secondary (" extra esophageal")  GERD from wide swings in gastric pressure that occur with throat clearing, often  promoting self use of mint and menthol lozenges that reduce the lower esophageal sphincter tone and exacerbate the problem further in a cyclical fashion.   These are the same pts (now being labeled as having "irritable larynx syndrome" by some cough centers) who not infrequently have a history of having failed to tolerate ace inhibitors,  dry powder inhalers or biphosphonates or report having atypical/extraesophageal reflux symptoms that don't respond to standard doses of PPI  and are easily confused as having aecopd or asthma flares by even experienced allergists/ pulmonologists (myself included).   Rec Depomedrol 120 mg IM  Resume 1st gen H1 blockers per guidelines   Continue off entresto and on valsartan titrated to sbp  < 100 ENT eval for globus to convince her she does not have excess pnd Gabapentin trial at  the lowest possible dose initially = 100 mg / day x 1 week and increase by 1 per week to max of 300 qid or lowest effective/tolerated dose   Discussed in detail all the  indications, usual  risks and alternatives  relative to the benefits with patient who agrees to proceed with Rx as outlined.             Each maintenance medication was reviewed in detail including emphasizing most importantly the difference between maintenance and prns and under what circumstances the prns are to be triggered using an action plan format where appropriate.  Total time for H and P, chart review, counseling,  and generating customized AVS unique to this ACUTE  office visit / same day charting  > 30 min for  refractory respiratory  symptoms of uncertain etiology

## 2023-05-24 NOTE — Patient Instructions (Addendum)
Depomedrol 120 mg IM today   Resume the For drainage / throat tickle try take CHLORPHENIRAMINE  4 mg  ("Allergy Relief" 4mg   at [facility] should be easiest to find in the blue box usually on bottom shelf)  take one every 4 hours as needed - extremely effective and inexpensive over the counter- may cause drowsiness so start with just a dose or two an hour before bedtime and see how you tolerate it before trying in daytime.   Use the hard rock candy to avoid throat clearing.  Stop the chlorpheniramine several days before ENT evaluation so they can correlate what you are feeling against what they are seeing.    My chart message me with how you took the gabapentin previously (dose and frequency) and maybe we can start it back at 100 mg daily until you get used to the side effects    Keep your follow up appointment

## 2023-05-25 NOTE — Progress Notes (Signed)
Remote ICD transmission.   

## 2023-05-31 DIAGNOSIS — H9312 Tinnitus, left ear: Secondary | ICD-10-CM | POA: Diagnosis not present

## 2023-05-31 DIAGNOSIS — K219 Gastro-esophageal reflux disease without esophagitis: Secondary | ICD-10-CM | POA: Insufficient documentation

## 2023-05-31 DIAGNOSIS — H9313 Tinnitus, bilateral: Secondary | ICD-10-CM | POA: Insufficient documentation

## 2023-06-21 ENCOUNTER — Encounter: Payer: Self-pay | Admitting: Family Medicine

## 2023-06-21 ENCOUNTER — Ambulatory Visit (INDEPENDENT_AMBULATORY_CARE_PROVIDER_SITE_OTHER): Payer: Medicare HMO | Admitting: Family Medicine

## 2023-06-21 VITALS — BP 116/78 | HR 78 | Temp 98.5°F | Ht 67.0 in | Wt 222.0 lb

## 2023-06-21 DIAGNOSIS — I5042 Chronic combined systolic (congestive) and diastolic (congestive) heart failure: Secondary | ICD-10-CM

## 2023-06-21 DIAGNOSIS — Z0000 Encounter for general adult medical examination without abnormal findings: Secondary | ICD-10-CM

## 2023-06-21 DIAGNOSIS — Z23 Encounter for immunization: Secondary | ICD-10-CM

## 2023-06-21 DIAGNOSIS — D649 Anemia, unspecified: Secondary | ICD-10-CM | POA: Diagnosis not present

## 2023-06-21 DIAGNOSIS — Z0001 Encounter for general adult medical examination with abnormal findings: Secondary | ICD-10-CM

## 2023-06-21 DIAGNOSIS — I428 Other cardiomyopathies: Secondary | ICD-10-CM | POA: Diagnosis not present

## 2023-06-21 DIAGNOSIS — I480 Paroxysmal atrial fibrillation: Secondary | ICD-10-CM

## 2023-06-21 DIAGNOSIS — E890 Postprocedural hypothyroidism: Secondary | ICD-10-CM | POA: Diagnosis not present

## 2023-06-21 DIAGNOSIS — R7303 Prediabetes: Secondary | ICD-10-CM

## 2023-06-21 LAB — BAYER DCA HB A1C WAIVED: HB A1C (BAYER DCA - WAIVED): 5.9 % — ABNORMAL HIGH (ref 4.8–5.6)

## 2023-06-21 NOTE — Progress Notes (Signed)
[name] [name] is a 62 y.o. female presents to office today for annual physical exam examination.    Concerns today include: 1.  Leg cramps She reports that she has been having some leg cramps that seem to be more prominent at night.  She has an appointment with Dr. Burgess Estelle at [facility] tomorrow to further evaluate.  She takes her cholesterol medicine as a 2.5 mg dose every other day as she cannot tolerate higher doses of statin secondary to myalgia.  She will be establishing with a new cardiologist, Dr. Tenny Craw in November.  She reports compliance with Coreg, Diovan and Inspra.  There are no preventive care reminders to display for this patient.  Refills needed today: None  Immunization History  Administered Date(s) Administered   Moderna Sars-Covid-2 Vaccination 01/02/2020, 02/04/2020, 10/19/2020   Tdap 04/05/2013, 06/21/2023   Past Medical History:  Diagnosis Date   AICD (automatic cardioverter/defibrillator) present 09   [employer]- replaced 2016   Arthritis    Bradycardia    Breast cancer (HCC) 02/24/2016   s/p lumpectomy and radiation but refused chemotherapy   Chronic systolic CHF (congestive heart failure) (HCC)    Cough due to angiotensin-converting enzyme inhibitor    Dizziness    Dizziness with standing after squatting, March, 2012   Dyslipidemia    LDL elevation,Patient does not want statin   Family history of breast cancer    GERD (gastroesophageal reflux disease)    H/O shortness of breath    CPX 10/18:  No evidence of cardiopulmonary limitation. Suspect exercise intolerance related to weight and deconditioning.    Hypothyroidism    ICD (implantable cardiac defibrillator) battery depletion    Dr. Graciela Husbands, June, 2009(artifactual atrial tachycardia response events addressed by reprogramming in parentheses   Leg swelling    right leg   Mitral regurgitation    Nonischemic cardiomyopathy (HCC)    Etiology unknown, diagnosed 2008   Paroxysmal atrial  fibrillation (HCC) 06/20/2022   Personal history of radiation therapy 2017   Pneumonia due to COVID-19 virus 10/2019   TIA (transient ischemic attack)    No CT or MRI abnormality, aspirin therapy   Social History   Socioeconomic History   Marital status: Widowed    Spouse name: Not on file   Number of children: Not on file   Years of education: Not on file   Highest education level: Not on file  Occupational History   Occupation: Widow  Tobacco Use   Smoking status: Never   Smokeless tobacco: Never  Vaping Use   Vaping status: Never Used  Substance and Sexual Activity   Alcohol use: No   Drug use: No   Sexual activity: Not Currently    Birth control/protection: Surgical    Comment: tubal  Other Topics Concern   Not on file  Social History Narrative   Not on file   Social Determinants of Health   Financial Resource Strain: Medium Risk (12/11/2019)   Overall Financial Resource Strain (CARDIA)    Difficulty of Paying Living Expenses: Somewhat hard  Food Insecurity: Not on file  Transportation Needs: No Transportation Needs (12/11/2019)   PRAPARE - Transportation    Lack of Transportation (Medical): No    Lack of Transportation (Non-Medical): No  Physical Activity: Insufficiently Active (12/11/2019)   Exercise Vital Sign    Days of Exercise per Week: 1 day    Minutes of Exercise per Session: 10 min  Stress: Not on file  Social Connections: Unknown (02/27/2022)  Received from [facility]   Social Network    Social Network: Not on file  Intimate Partner Violence: Unknown (01/25/2022)   Received from [facility]   HITS    Physically Hurt: Not on file    Insult or Talk Down To: Not on file    Threaten Physical Harm: Not on file    Scream or Curse: Not on file   Past Surgical History:  Procedure Laterality Date   BREAST BIOPSY Left 02/24/2016   U/S Core   BREAST BIOPSY Left 02/24/2016   U/S Core   BREAST LUMPECTOMY Left 04/05/2016    invasive ductal    BREAST LUMPECTOMY WITH RADIOACTIVE SEED AND SENTINEL LYMPH NODE BIOPSY Left 04/05/2016   Procedure: BREAST LUMPECTOMY WITH RADIOACTIVE SEED AND SENTINEL LYMPH NODE BIOPSY;  Surgeon: [name], MD;  Location: [facility] OR;  Service: General;  Laterality: Left;   CARDIAC CATHETERIZATION N/A 08/05/2016   Procedure: Left Heart Cath and Coronary Angiography;  Surgeon: Peter M Swaziland, MD;  Location: [facility] INVASIVE CV LAB;  Service: Cardiovascular;  Laterality: N/A;   CARDIAC DEFIBRILLATOR PLACEMENT  09   [employer] no remote   COLONOSCOPY     CYST EXCISION Left    back of leg-lt    IMPLANTABLE CARDIOVERTER DEFIBRILLATOR GENERATOR CHANGE N/A 12/29/2014   Procedure: IMPLANTABLE CARDIOVERTER DEFIBRILLATOR GENERATOR CHANGE;  Surgeon: [name], MD;  Location: [facility] CATH LAB;  Service: Cardiovascular;  Laterality: N/A;   LATERAL EPICONDYLE RELEASE  10/11/2012   Procedure: TENNIS ELBOW RELEASE;  Surgeon: Loreta Ave, MD;  Location: [facility];  Service: Orthopedics;  Laterality: Right;  RIGHT ELBOW: TENOTOMY ELBOW LATERAL EPICONDYLITIS TENNIS ELBOW: RADIAL TUNNEL RELEASE   PORT-A-CATH REMOVAL  04/13/2016   Procedure: MINOR REMOVAL PORT-A-CATH;  Surgeon: Manus Rudd, MD;  Location: [facility];  Service: General;;   PORTACATH PLACEMENT Right 04/05/2016   Procedure: INSERTION PORT-A-CATH WITH [place];  Surgeon: [name], MD;  Location: [facility] OR;  Service: General;  Laterality: Right;  right IJ   TOTAL THYROIDECTOMY  2001   TUBAL LIGATION  89   UPPER GI ENDOSCOPY     Family History  Problem Relation Age of Onset   Cancer Paternal Grandmother        possible cancer, unknown type   Heart attack Mother    Cancer Mother        breast   Breast cancer Mother 47   Hypertension Brother    Hypertension Sister    Cancer Maternal Aunt        breast   Breast cancer Maternal Aunt    Stroke Neg Hx    Diabetes Neg Hx     Current Outpatient Medications:     acetaminophen (TYLENOL) 500 MG tablet, Take 500 mg by mouth every 6 (six) hours as needed for mild pain or moderate pain., Disp: , Rfl:    apixaban (ELIQUIS) 5 MG TABS tablet, Take 1 tablet (5 mg total) by mouth 2 (two) times daily. NEEDS FOLLOW UP APPOINTMENT FOR MORE REFILLS, Disp: 180 tablet, Rfl: 0   carvedilol (COREG) 3.125 MG tablet, Take 1 tablet (3.125 mg total) by mouth 2 (two) times daily. NEEDS FOLLOW UP APPOINTMENT FOR ANYMORE REFILLS, Disp: 180 tablet, Rfl: 3   chlorpheniramine (CHLOR-TRIMETON) 4 MG tablet, Take 4 mg by mouth every 4 (four) hours as needed for allergies., Disp: , Rfl:    eplerenone (INSPRA) 25 MG tablet, TAKE 1 TABLET BY MOUTH ONCE DAILY.,  Disp: 90 tablet, Rfl: 3   famotidine (PEPCID) 20 MG tablet, One after supper, Disp: 30 tablet, Rfl: 11   levothyroxine (SYNTHROID) 75 MCG tablet, Take 1 tablet (75 mcg total) by mouth daily before breakfast., Disp: 30 tablet, Rfl: 12   pantoprazole (PROTONIX) 40 MG tablet, Take 1 tablet (40 mg total) by mouth daily. Take 30-60 min before first meal of the day, Disp: 30 tablet, Rfl: 2   rosuvastatin (CRESTOR) 5 MG tablet, Take 1 tablet (5 mg total) by mouth daily., Disp: 90 tablet, Rfl: 2   valsartan (DIOVAN) 80 MG tablet, Take 1 tablet (80 mg total) by mouth 2 (two) times daily., Disp: 60 tablet, Rfl: 11  Allergies  Allergen Reactions   Avelox [Moxifloxacin Hcl In Nacl] Shortness Of Breath    Shortness of breath   Clindamycin/Lincomycin Shortness Of Breath and Diarrhea    Breathing/diarrhea     Kiwi Extract Anaphylaxis    Makes pt. Feel like her throat is closing    Bactrim [Sulfamethoxazole-Trimethoprim] Swelling   Levofloxacin Other (See Comments)    JOINT PAIN JOINT PAIN   Simvastatin Other (See Comments)    Joint pain   Spironolactone Other (See Comments)    burning   Moxifloxacin Hcl Other (See Comments)   Tramadol Nausea Only     ROS: Review of Systems Pertinent items noted in HPI and remainder of  comprehensive ROS otherwise negative.    Physical exam BP 116/78   Pulse 78   Temp 98.5 F (36.9 C)   Ht 5\' 7"  (1.702 m)   Wt 222 lb (100.7 kg)   SpO2 96%   BMI 34.77 kg/m  General appearance: alert, cooperative, appears stated age, no distress, and mildly obese Head: Normocephalic, without obvious abnormality, atraumatic Eyes: negative findings: lids and lashes normal, conjunctivae and sclerae normal, corneas clear, and pupils equal, round, reactive to light and accomodation Ears: normal TM's and external ear canals both ears Nose: Nares normal. Septum midline. Mucosa normal. No drainage or sinus tenderness. Throat: lips, mucosa, and tongue normal; teeth and gums normal Neck: no adenopathy, supple, symmetrical, trachea midline, and thyroid not enlarged, symmetric, no tenderness/mass/nodules Back: symmetric, no curvature. ROM normal. No CVA tenderness. Lungs: clear to auscultation bilaterally Heart: regular rate and rhythm, S1, S2 normal, no murmur, click, rub or gallop Abdomen: soft, non-tender; bowel sounds normal; no masses,  no organomegaly Extremities: extremities normal, atraumatic, no cyanosis or edema Pulses: 2+ and symmetric Skin: Skin color, texture, turgor normal. No rashes or lesions Lymph nodes: Cervical, supraclavicular, and axillary nodes normal. Neurologic: Grossly normal      06/21/2023    1:07 PM 12/20/2022    1:19 PM 12/01/2022    2:20 PM  Depression screen PHQ 2/9  Decreased Interest 1 0   Down, Depressed, Hopeless 1 0   PHQ - 2 Score 2 0   Altered sleeping 3 2 2   Tired, decreased energy 1 2 2   Change in appetite 1 1 1   Feeling bad or failure about yourself  0 0 0  Trouble concentrating 0 0 0  Moving slowly or fidgety/restless 0 0 0  Suicidal thoughts 0 0 0  PHQ-9 Score 7 5   Difficult doing work/chores  Somewhat difficult Not difficult at all      06/21/2023    1:06 PM 12/20/2022    1:19 PM 12/01/2022    2:19 PM 09/20/2022    1:56 PM  GAD 7 :  Generalized Anxiety Score  Nervous, Anxious, on Edge  1 0 0 0  Control/stop worrying 0 0 0 0  Worry too much - different things 0 0 1 1  Trouble relaxing 2 0 1 1  Restless 0 0 0 0  Easily annoyed or irritable 1 0 0 0  Afraid - awful might happen 0 0 1 1  Total GAD 7 Score 4 0 3 3  Anxiety Difficulty Not difficult at all Not difficult at all Somewhat difficult Somewhat difficult     Assessment/ Plan: Barbaraann Rondo here for annual physical exam.   Annual physical exam  Paroxysmal atrial fibrillation (HCC) - Plan: Anemia Profile B  NICM (nonischemic cardiomyopathy) (HCC) - Plan: CMP14+EGFR, Lipid Panel  Chronic combined systolic and diastolic HF (heart failure) (HCC) - Plan: CMP14+EGFR, Lipid Panel  Anemia, unspecified type - Plan: Anemia Profile B  Pre-diabetes - Plan: Bayer DCA Hb A1c Waived  Seem to both rate and rhythm controlled on exam today.  I would like to check an anemia profile on her given anemic hemoglobin noted on last lab draw.  Nonfasting labs collected today.  A1c with persistent prediabetes.  Reinforced lifestyle modification needs and limiting of salt given history of CHF.  I do wonder if she may benefit from something like Repatha.  I will CC Dr. Tenny Craw for further input.   Tetanus shot administered today  Counseled on healthy lifestyle choices, including diet (rich in fruits, vegetables and lean meats and low in salt and simple carbohydrates) and exercise (at least 30 minutes of moderate physical activity daily).  Patient to follow up 60m  Travares Nelles M. Nadine Counts, DO

## 2023-06-22 DIAGNOSIS — B0229 Other postherpetic nervous system involvement: Secondary | ICD-10-CM | POA: Diagnosis not present

## 2023-06-22 DIAGNOSIS — M4716 Other spondylosis with myelopathy, lumbar region: Secondary | ICD-10-CM | POA: Diagnosis not present

## 2023-06-22 DIAGNOSIS — M461 Sacroiliitis, not elsewhere classified: Secondary | ICD-10-CM | POA: Diagnosis not present

## 2023-06-22 LAB — CMP14+EGFR
ALT: 17 IU/L (ref 0–32)
AST: 16 IU/L (ref 0–40)
Albumin: 4 g/dL (ref 3.9–4.9)
Alkaline Phosphatase: 70 IU/L (ref 44–121)
BUN/Creatinine Ratio: 13 (ref 12–28)
BUN: 12 mg/dL (ref 8–27)
Bilirubin Total: 0.2 mg/dL (ref 0.0–1.2)
CO2: 26 mmol/L (ref 20–29)
Calcium: 9.3 mg/dL (ref 8.7–10.3)
Chloride: 103 mmol/L (ref 96–106)
Creatinine, Ser: 0.89 mg/dL (ref 0.57–1.00)
Globulin, Total: 2.5 g/dL (ref 1.5–4.5)
Glucose: 79 mg/dL (ref 70–99)
Potassium: 4.3 mmol/L (ref 3.5–5.2)
Sodium: 141 mmol/L (ref 134–144)
Total Protein: 6.5 g/dL (ref 6.0–8.5)
eGFR: 73 mL/min/{1.73_m2} (ref >59)

## 2023-06-22 LAB — ANEMIA PROFILE B
Basophils Absolute: 0 10*3/uL (ref 0.0–0.2)
Basos: 0 %
EOS (ABSOLUTE): 0.2 10*3/uL (ref 0.0–0.4)
Eos: 3 %
Ferritin: 71 ng/mL (ref 15–150)
Folate: 8.4 ng/mL (ref >3.0)
Hematocrit: 36.5 % (ref 34.0–46.6)
Hemoglobin: 11.8 g/dL (ref 11.1–15.9)
Immature Grans (Abs): 0 10*3/uL (ref 0.0–0.1)
Immature Granulocytes: 0 %
Iron Saturation: 15 % (ref 15–55)
Iron: 40 ug/dL (ref 27–139)
Lymphocytes Absolute: 2 10*3/uL (ref 0.7–3.1)
Lymphs: 30 %
MCH: 29.7 pg (ref 26.6–33.0)
MCHC: 32.3 g/dL (ref 31.5–35.7)
MCV: 92 fL (ref 79–97)
Monocytes Absolute: 0.4 10*3/uL (ref 0.1–0.9)
Monocytes: 6 %
Neutrophils Absolute: 4 10*3/uL (ref 1.4–7.0)
Neutrophils: 61 %
Platelets: 240 10*3/uL (ref 150–450)
RBC: 3.97 x10E6/uL (ref 3.77–5.28)
RDW: 13.1 % (ref 11.7–15.4)
Retic Ct Pct: 1.4 % (ref 0.6–2.6)
Total Iron Binding Capacity: 264 ug/dL (ref 250–450)
UIBC: 224 ug/dL (ref 118–369)
Vitamin B-12: 480 pg/mL (ref 232–1245)
WBC: 6.7 10*3/uL (ref 3.4–10.8)

## 2023-06-22 LAB — LIPID PANEL
Chol/HDL Ratio: 2.6 ratio (ref 0.0–4.4)
Cholesterol, Total: 237 mg/dL — ABNORMAL HIGH (ref 100–199)
HDL: 91 mg/dL (ref >39)
LDL Chol Calc (NIH): 134 mg/dL — ABNORMAL HIGH (ref 0–99)
Triglycerides: 72 mg/dL (ref 0–149)
VLDL Cholesterol Cal: 12 mg/dL (ref 5–40)

## 2023-06-23 LAB — MAGNESIUM: Magnesium: 2.1 mg/dL (ref 1.6–2.3)

## 2023-06-23 LAB — SPECIMEN STATUS REPORT

## 2023-06-26 ENCOUNTER — Other Ambulatory Visit: Payer: Self-pay

## 2023-06-26 ENCOUNTER — Emergency Department (HOSPITAL_COMMUNITY)
Admission: EM | Admit: 2023-06-26 | Discharge: 2023-06-27 | Disposition: A | Discharge status: Home / Self Care | Payer: Medicare HMO | Attending: Emergency Medicine | Admitting: Emergency Medicine

## 2023-06-26 ENCOUNTER — Other Ambulatory Visit: Payer: Self-pay | Admitting: Family Medicine

## 2023-06-26 DIAGNOSIS — L509 Urticaria, unspecified: Secondary | ICD-10-CM | POA: Diagnosis not present

## 2023-06-26 DIAGNOSIS — Z7901 Long term (current) use of anticoagulants: Secondary | ICD-10-CM | POA: Diagnosis not present

## 2023-06-26 DIAGNOSIS — R21 Rash and other nonspecific skin eruption: Secondary | ICD-10-CM | POA: Diagnosis not present

## 2023-06-26 DIAGNOSIS — E890 Postprocedural hypothyroidism: Secondary | ICD-10-CM

## 2023-06-26 NOTE — ED Triage Notes (Signed)
Pt sts concern for possible reaction from tetanus shots. Notice a rash to arms, neck, and torso earlier this afternoon. Called tele triage RN who recommended ED evaluation. Denies SOB/Throat swelling. Denies exposure to new food/medication. No hx of tetanus shot reaction in the past.

## 2023-06-27 ENCOUNTER — Encounter: Payer: Self-pay | Admitting: Family Medicine

## 2023-06-27 MED ORDER — PREDNISONE 10 MG PO TABS: 20.0000 mg | ORAL_TABLET | Freq: Every day | ORAL | 0 refills | Status: AC

## 2023-06-27 NOTE — Discharge Instructions (Signed)
A prescription for prednisone was sent to your pharmacy.  Take for the next 3 days.  Take over-the-counter Benadryl as needed for itchiness.  Return to the emergency department for any new or worsening symptoms of concern.

## 2023-06-27 NOTE — ED Provider Notes (Signed)
[place] EMERGENCY DEPARTMENT AT [facility] Provider Note   CSN: [id number] Arrival date & time: 06/26/23  2241     History  Chief Complaint  Patient presents with   Rash    [name] [name] is a 62 y.o. female.   Rash Patient presents for pruritic rash.  Onset was tonight.  Since onset, rash has improved and she has only mild itchiness remaining.  She denies any associated symptoms of shortness of breath, chest tightness, nausea, lightheadedness, or dizziness.  She has had allergic reaction to kiwi in the past.  She denies any known new allergen exposures.  She did get a tetanus shot 5 days ago.     Home Medications Prior to Admission medications   Medication Sig Start Date End Date Taking? Authorizing Provider  predniSONE (DELTASONE) 10 MG tablet Take 2 tablets (20 mg total) by mouth daily for 3 days. 06/27/23 06/30/23 Yes Gloris Manchester, MD  acetaminophen (TYLENOL) 500 MG tablet Take 500 mg by mouth every 6 (six) hours as needed for mild pain or moderate pain.    [provider]  apixaban (ELIQUIS) 5 MG TABS tablet Take 1 tablet (5 mg total) by mouth 2 (two) times daily. NEEDS FOLLOW UP APPOINTMENT FOR MORE REFILLS 04/25/23   Jacklynn Ganong, FNP  carvedilol (COREG) 3.125 MG tablet Take 1 tablet (3.125 mg total) by mouth 2 (two) times daily. NEEDS FOLLOW UP APPOINTMENT FOR ANYMORE REFILLS 04/26/23   Meriam Sprague, MD  chlorpheniramine (CHLOR-TRIMETON) 4 MG tablet Take 4 mg by mouth every 4 (four) hours as needed for allergies.    [provider]  eplerenone (INSPRA) 25 MG tablet TAKE 1 TABLET BY MOUTH ONCE DAILY. 12/07/22   Perlie Gold, PA-C  famotidine (PEPCID) 20 MG tablet One after supper 03/27/23   Nyoka Cowden, MD  levothyroxine (SYNTHROID) 75 MCG tablet Take 1 tablet (75 mcg total) by mouth daily before breakfast. 05/17/22   Delynn Flavin M, DO  pantoprazole (PROTONIX) 40 MG tablet Take 1 tablet (40 mg total) by mouth daily. Take 30-60 min  before first meal of the day 03/27/23   Nyoka Cowden, MD  rosuvastatin (CRESTOR) 5 MG tablet Take 1 tablet (5 mg total) by mouth daily. 03/31/23   Meriam Sprague, MD  valsartan (DIOVAN) 80 MG tablet Take 1 tablet (80 mg total) by mouth 2 (two) times daily. 03/27/23   Nyoka Cowden, MD  magic mouthwash (nystatin, lidocaine, diphenhydrAMINE, alum & mag hydroxide) suspension Swish and spit 5 mLs 4 (four) times daily as needed for mouth pain. 12/12/22   Valentino Nose, NP      Allergies    Avelox [moxifloxacin hcl in nacl], Clindamycin/lincomycin, Kiwi extract, Bactrim [sulfamethoxazole-trimethoprim], Levofloxacin, Simvastatin, Spironolactone, Moxifloxacin hcl, and Tramadol    Review of Systems   Review of Systems  Skin:  Positive for rash.  All other systems reviewed and are negative.   Physical Exam Updated Vital Signs BP 121/78 (BP Location: Right Arm)   Pulse 82   Temp 98.7 F (37.1 C) (Oral)   Resp 14   Ht 5\' 7"  (1.702 m)   Wt 100.7 kg   SpO2 95%   BMI 34.77 kg/m  Physical Exam Vitals and nursing note reviewed.  Constitutional:      General: She is not in acute distress.    Appearance: Normal appearance. She is well-developed. She is not ill-appearing, toxic-appearing or diaphoretic.  HENT:     Head: Normocephalic and atraumatic.  Right Ear: External ear normal.     Left Ear: External ear normal.     Nose: Nose normal.     Mouth/Throat:     Mouth: Mucous membranes are moist.  Eyes:     Extraocular Movements: Extraocular movements intact.     Conjunctiva/sclera: Conjunctivae normal.  Cardiovascular:     Rate and Rhythm: Normal rate and regular rhythm.  Pulmonary:     Effort: Pulmonary effort is normal. No respiratory distress.  Abdominal:     General: There is no distension.     Palpations: Abdomen is soft.  Musculoskeletal:        General: No swelling. Normal range of motion.     Cervical back: Normal range of motion and neck supple.  Skin:     General: Skin is warm and dry.     Findings: Rash (Scant areas of erythematous rash on bilateral upper extremities.) present.  Neurological:     General: No focal deficit present.     Mental Status: She is alert and oriented to person, place, and time.  Psychiatric:        Mood and Affect: Mood normal.        Behavior: Behavior normal.     ED Results / Procedures / Treatments   Labs (all labs ordered are listed, but only abnormal results are displayed) Labs Reviewed - No data to display  EKG None  Radiology No results found.  Procedures Procedures    Medications Ordered in ED Medications - No data to display  ED Course/ Medical Decision Making/ A&P                                 Medical Decision Making  Patient presents to the ED for pruritic rash that developed today.  While in the ED waiting room, she did have near resolution of rash.  She did not take any Benadryl prior to arrival.  On exam, patient is well-appearing.  She has a few remaining scant areas of erythematous rash on her upper extremities.  She states that, previously, it was involving most of her upper extremities and her neck.  She denies any other symptoms.  Patient had no known allergen exposures.  She is concerned of a tetanus shot that she got 5 days ago.  Given the timeline, I feel that this is the unlikely trigger.  She has had allergic reaction to kiwi in the past.  Patient may have inadvertently eaten some food allergen.  Regardless of the trigger, patient's improved symptoms is reassuring.  Patient was prescribed prednisone for 3 days.  She was advised to take Benadryl as needed.  She was discharged in good condition.        Final Clinical Impression(s) / ED Diagnoses Final diagnoses:  Urticaria    Rx / DC Orders ED Discharge Orders          Ordered    predniSONE (DELTASONE) 10 MG tablet  Daily        06/27/23 0106              Gloris Manchester, MD 06/27/23 0106

## 2023-07-06 DIAGNOSIS — Z809 Family history of malignant neoplasm, unspecified: Secondary | ICD-10-CM | POA: Diagnosis not present

## 2023-07-06 DIAGNOSIS — E669 Obesity, unspecified: Secondary | ICD-10-CM | POA: Diagnosis not present

## 2023-07-06 DIAGNOSIS — I4891 Unspecified atrial fibrillation: Secondary | ICD-10-CM | POA: Diagnosis not present

## 2023-07-06 DIAGNOSIS — I110 Hypertensive heart disease with heart failure: Secondary | ICD-10-CM | POA: Diagnosis not present

## 2023-07-06 DIAGNOSIS — I509 Heart failure, unspecified: Secondary | ICD-10-CM | POA: Diagnosis not present

## 2023-07-06 DIAGNOSIS — Z8249 Family history of ischemic heart disease and other diseases of the circulatory system: Secondary | ICD-10-CM | POA: Diagnosis not present

## 2023-07-06 DIAGNOSIS — Z008 Encounter for other general examination: Secondary | ICD-10-CM | POA: Diagnosis not present

## 2023-07-06 DIAGNOSIS — E785 Hyperlipidemia, unspecified: Secondary | ICD-10-CM | POA: Diagnosis not present

## 2023-07-06 DIAGNOSIS — R32 Unspecified urinary incontinence: Secondary | ICD-10-CM | POA: Diagnosis not present

## 2023-07-06 DIAGNOSIS — K219 Gastro-esophageal reflux disease without esophagitis: Secondary | ICD-10-CM | POA: Diagnosis not present

## 2023-07-06 DIAGNOSIS — D6869 Other thrombophilia: Secondary | ICD-10-CM | POA: Diagnosis not present

## 2023-07-06 DIAGNOSIS — E039 Hypothyroidism, unspecified: Secondary | ICD-10-CM | POA: Diagnosis not present

## 2023-07-06 DIAGNOSIS — M1990 Unspecified osteoarthritis, unspecified site: Secondary | ICD-10-CM | POA: Diagnosis not present

## 2023-07-10 DIAGNOSIS — L501 Idiopathic urticaria: Secondary | ICD-10-CM | POA: Diagnosis not present

## 2023-07-10 DIAGNOSIS — L820 Inflamed seborrheic keratosis: Secondary | ICD-10-CM | POA: Diagnosis not present

## 2023-07-10 DIAGNOSIS — L209 Atopic dermatitis, unspecified: Secondary | ICD-10-CM | POA: Diagnosis not present

## 2023-07-24 DIAGNOSIS — M25561 Pain in right knee: Secondary | ICD-10-CM | POA: Diagnosis not present

## 2023-07-24 DIAGNOSIS — M25562 Pain in left knee: Secondary | ICD-10-CM | POA: Diagnosis not present

## 2023-07-29 ENCOUNTER — Other Ambulatory Visit (HOSPITAL_COMMUNITY): Payer: Self-pay | Admitting: Family Medicine

## 2023-08-07 DIAGNOSIS — L209 Atopic dermatitis, unspecified: Secondary | ICD-10-CM | POA: Diagnosis not present

## 2023-08-07 DIAGNOSIS — L821 Other seborrheic keratosis: Secondary | ICD-10-CM | POA: Diagnosis not present

## 2023-08-07 DIAGNOSIS — L501 Idiopathic urticaria: Secondary | ICD-10-CM | POA: Diagnosis not present

## 2023-08-10 ENCOUNTER — Ambulatory Visit (INDEPENDENT_AMBULATORY_CARE_PROVIDER_SITE_OTHER): Payer: Medicare HMO

## 2023-08-10 DIAGNOSIS — I428 Other cardiomyopathies: Secondary | ICD-10-CM | POA: Diagnosis not present

## 2023-08-10 DIAGNOSIS — I480 Paroxysmal atrial fibrillation: Secondary | ICD-10-CM

## 2023-08-10 LAB — CUP PACEART REMOTE DEVICE CHECK
Battery Remaining Longevity: 48 mo
Battery Remaining Percentage: 56 %
Brady Statistic RA Percent Paced: 0 %
Brady Statistic RV Percent Paced: 0 %
Date Time Interrogation Session: 20241017001600
HighPow Impedance: 53 Ohm
Implantable Lead Connection Status: 753985
Implantable Lead Connection Status: 753985
Implantable Lead Implant Date: 20090320
Implantable Lead Implant Date: 20090320
Implantable Lead Location: 753859
Implantable Lead Location: 753860
Implantable Lead Model: 158
Implantable Lead Model: 5076
Implantable Lead Serial Number: 182504
Implantable Pulse Generator Implant Date: 20160307
Lead Channel Impedance Value: 443 Ohm
Lead Channel Impedance Value: 484 Ohm
Lead Channel Pacing Threshold Amplitude: 0.5 V
Lead Channel Pacing Threshold Amplitude: 1.2 V
Lead Channel Pacing Threshold Pulse Width: 0.4 ms
Lead Channel Pacing Threshold Pulse Width: 0.4 ms
Lead Channel Setting Pacing Amplitude: 2 V
Lead Channel Setting Pacing Amplitude: 2.5 V
Lead Channel Setting Pacing Pulse Width: 0.4 ms
Lead Channel Setting Sensing Sensitivity: 0.6 mV
Pulse Gen Serial Number: 111826

## 2023-08-21 ENCOUNTER — Encounter: Payer: Self-pay | Admitting: Family Medicine

## 2023-08-21 ENCOUNTER — Other Ambulatory Visit: Payer: Self-pay | Admitting: Family Medicine

## 2023-08-21 DIAGNOSIS — K589 Irritable bowel syndrome without diarrhea: Secondary | ICD-10-CM

## 2023-08-21 MED ORDER — DICYCLOMINE HCL 10 MG PO CAPS
10.0000 mg | ORAL_CAPSULE | Freq: Four times a day (QID) | ORAL | 1 refills | Status: DC | PRN
Start: 2023-08-21 — End: 2023-11-27

## 2023-08-25 ENCOUNTER — Encounter: Payer: Self-pay | Admitting: Internal Medicine

## 2023-08-25 ENCOUNTER — Ambulatory Visit: Payer: Medicare HMO | Attending: Internal Medicine | Admitting: Internal Medicine

## 2023-08-25 VITALS — BP 116/74 | HR 72 | Ht 67.0 in | Wt 226.2 lb

## 2023-08-25 DIAGNOSIS — I480 Paroxysmal atrial fibrillation: Secondary | ICD-10-CM | POA: Diagnosis not present

## 2023-08-25 DIAGNOSIS — I5042 Chronic combined systolic (congestive) and diastolic (congestive) heart failure: Secondary | ICD-10-CM

## 2023-08-25 DIAGNOSIS — E7849 Other hyperlipidemia: Secondary | ICD-10-CM

## 2023-08-25 MED ORDER — APIXABAN 5 MG PO TABS: 5.0000 mg | ORAL_TABLET | Freq: Two times a day (BID) | ORAL | 0 refills | Status: DC

## 2023-08-25 NOTE — Patient Instructions (Signed)
Medication Instructions:  Your physician recommends that you continue on your current medications as directed. Please refer to the Current Medication list given to you today.  *If you need a refill on your cardiac medications before your next appointment, please call your pharmacy*   Lab Work: Lab work to be done today--Lipomed, Lp(a), ApoB, TSH If you have labs (blood work) drawn today and your tests are completely normal, you will receive your results only by: MyChart Message (if you have MyChart) OR A paper copy in the mail If you have any lab test that is abnormal or we need to change your treatment, we will call you to review the results.   Testing/Procedures: none   Follow-Up: At [facility], you and your health needs are our priority.  As part of our continuing mission to provide you with exceptional heart care, we have created designated Provider Care Teams.  These Care Teams include your primary Cardiologist (physician) and Advanced Practice Providers (APPs -  Physician Assistants and Nurse Practitioners) who all work together to provide you with the care you need, when you need it.  We recommend signing up for the patient portal called "MyChart".  Sign up information is provided on this After Visit Summary.  MyChart is used to connect with patients for Virtual Visits (Telemedicine).  Patients are able to view lab/test results, encounter notes, upcoming appointments, etc.  Non-urgent messages can be sent to your provider as well.   To learn more about what you can do with MyChart, go to [email or web address].    Your next appointment:   6 month(s)  Provider:   Dr Tenny Craw    Other Instructions Please schedule follow up appointment with EP

## 2023-08-27 LAB — NMR, LIPOPROFILE
Cholesterol, Total: 190 mg/dL (ref 100–199)
HDL Particle Number: 38.9 umol/L
HDL-C: 73 mg/dL
LDL Particle Number: 1188 nmol/L — ABNORMAL HIGH
LDL Size: 21.4 nm
LDL-C (NIH Calc): 105 mg/dL — ABNORMAL HIGH (ref 0–99)
LP-IR Score: <25
Small LDL Particle Number: 383 nmol/L
Triglycerides: 65 mg/dL (ref 0–149)

## 2023-08-27 LAB — TSH: TSH: 1.38 u[IU]/mL (ref 0.450–4.500)

## 2023-08-27 LAB — LIPOPROTEIN A (LPA): Lipoprotein (a): 325.7 nmol/L — ABNORMAL HIGH

## 2023-08-27 LAB — APOLIPOPROTEIN B: Apolipoprotein B: 81 mg/dL

## 2023-08-28 ENCOUNTER — Encounter: Payer: Self-pay | Admitting: Family Medicine

## 2023-08-29 ENCOUNTER — Ambulatory Visit: Payer: Medicare HMO | Admitting: Internal Medicine

## 2023-08-29 VITALS — BP 102/69 | HR 83 | Ht 67.0 in | Wt 222.0 lb

## 2023-08-29 DIAGNOSIS — R058 Other specified cough: Secondary | ICD-10-CM | POA: Diagnosis not present

## 2023-08-29 NOTE — Assessment & Plan Note (Signed)
Onset in 2015 while on ACEi/ best rx =  1st gen H1 blockers per guidelines   - Entresto added 2017 with onset of "allergies" around 2020 - 11/28/2022 rec trial off Entresto and on Valsartan max doses  plus max gerd rx >>>helped some - Allergy screen 02/06/2023 >  Eos 0.1 /  IgE  116 - 02/06/2023 added 1st gen H1 blockers per guidelines   - 03/27/2023 try   off Entresto and on valsartan 80 mb bid/baseline bnp = 38 on 03/08/23  and no reported doe at all /  max gerd and  1st gen H1 blockers per guidelines  plus depo 120 and f/u in 4 weeks > much better 05/03/2023 > continue rx x 3 m then regroup - 05/24/2023 cough reported much worse off h1 and tinnitus no better but did not restart H1 and lots of watery nasal d/c off it/ esp bothersome at hs  - 08/29/2023 try wean off ppi as tol, f/u PCP, gi and ent prn   Discussed the recent press about ppi's in the context of a statistically significant (but questionably clinically relevant) increase in CRI in pts on ppi vs h2's > bottom line is the lowest dose of ppi that controls   gerd is the right dose and if that dose is zero that's fine esp since h2's are cheaper.     Rec try taper off and if flares can resume otc or f/u as above with PCP,gi, ent          Each maintenance medication was reviewed in detail including emphasizing most importantly the difference between maintenance and prns and under what circumstances the prns are to be triggered using an action plan format where appropriate.  Total time for H and P, chart review, counseling,  and generating customized AVS unique to this office visit / same day charting = 25 min

## 2023-08-29 NOTE — Progress Notes (Signed)
[name] [name], female    DOB: [birth year]   MRN: [id number]   Brief patient profile:  18 yobf never smoker f/b Shari Prows and Graciela Husbands for cardiac problems since 2007  referred to pulmonary clinic 11/28/2022 by Emelia Salisbury for "allergies" about the same time covid became prevalent and since then 2 documented covid infections the first one in Jan 2021 last one in Oct 2023 and since then 24 / 7 globus sensation   Notes indicate ACEi cough in 2015 per Dr Myrtis Ser   Dx R dvt PE p covid and recurred and none  since placed on eliquis "indefinitely"   History of Present Illness  11/28/2022  Pulmonary/ 1st office eval/Virgen Belland sensation of globus x years/ placed on Entresto 12/2015  Chief Complaint  Patient presents with   Consult    Follow up from ED visit.  Chest pressure and cough.  COVID October 2023.  Hx of pulmonary embolism.  Negative with this visit.  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   GERD diet reviewed, bed blocks rec   I will send Dr Shari Prows a message to ask to stop the Entresto  x 4-6 weeks and I will see you in 5 weeks [place] > helped some, ? No worse p started back on entresto though    02/06/2023  f/u ov/[place] office/Trany Chernick re: cough since covid  maint on gerd rx  and zyrtec  Chief Complaint  Patient presents with   Follow-up    Pt f/u states that her breathing is fine   Dyspnea:  tired, not limited by breathing  Cough: worse at hs does not keep her up  and recurs p stirring in am/ using cinnamon to control   Sleeping: flat bed / flat pillow  SABA use: not using  02: oxygen  Rec For drainage / throat tickle try take CHLORPHENIRAMINE  4 mg     Strongly recommend bed blocks x 6-8 inches   Please remember to go to the lab department   for your tests - we will call you with the results when they are available.  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 Verbalized she has had 2 CXR that were "normal"   Dyspnea:  Not limited by breathing from desired activities   Cough: non stop, assoc with midline cp with cough  Sleeping: flat bed/ 2 pillows/ L side more sob  SABA use: not helping  02: none Rec For drainage / throat tickle try take CHLORPHENIRAMINE  4 mg    Stop entresto today and replace it with valsartan 80 mg one twice  daily and ok to take extra if you fine your top number is over 110  Resume Pantoprazole (protonix) 40 mg   Take  30-60 min before first meal of the day and Pepcid (famotidine)  20 mg after supper until return to office  Depomedrol 120 mg IM today  Please schedule a follow up office visit in 4 weeks, sooner if needed  with all medications /inhalers/ solutions in hand     05/03/2023  f/u ov/[place] office/Lazaro Isenhower re: UACS maint on GERD Rx  did  bring meds Chief Complaint  Patient presents with   Follow-up    Cough is much improved. She has not used albuterol in the past month.   Dyspnea:  not limited except by back pain  Cough: still present and dry  Sleeping: flat bed with pillows no noct cough p h1  SABA use: none x one month Rec Try off the Allergy Relief to see what difference it makes If ringing continues you will need to see ENT (who also can see you for throat tickle)   Please schedule a follow  up visit in 3 months but call sooner if needed    05/24/2023  ACUTE  ov/[place] office/Talor Cheema re: cough maint on off H1 much worse since stopped with sense of "too much mucus in throat"    Chief Complaint  Patient presents with   Upper airway cough syndrome   Dyspnea:  if anything  this is better since off entresto Cough: mostly dry with one spec of green after lots of cough, some watery nasal d/c  Sleeping: flat bed two pillows w/in 5 min starts coughing off h1  SABA use: albuterol not helping  02: none  Could not tol gabapentin for back pain but not sure what strength was used  Rec Depomedrol 120 mg IM today  Resume the For drainage / throat tickle try take CHLORPHENIRAMINE  4 mg  Use the hard rock candy to avoid throat clearing. Stop the chlorpheniramine several days before ENT evaluation so they can correlate what you are feeling against what they are seeing.   My chart message me with how you took the gabapentin previously (dose and frequency) and maybe we can start it back at 100 mg daily until you get used to the side effects     08/29/2023  f/u ov/ office/Kedron Uno re: UACS  maint on GERD rx / h1 prn  / could not tol even lowest dose  of gabapentin  Chief Complaint  Patient presents with   Upper airway cough syndrome   Dyspnea:  Not limited by breathing from desired activities  / very busy working at [employer] but no aerobics Cough: resolved to her satisfaction, still some throat clearing  Sleeping: flat bed/ 2 pillow and no  resp cc  SABA use: none  02: none     No obvious day to day or daytime variability or assoc excess/ purulent sputum or mucus plugs or hemoptysis or cp or chest tightness, subjective wheeze or overt sinus or hb symptoms.    Also denies any obvious fluctuation of symptoms with weather or environmental changes or other aggravating or alleviating factors except as outlined above   No unusual exposure hx or h/o childhood pna/ asthma or knowledge of  premature birth.  Current Allergies, Complete Past Medical History, Past Surgical History, Family History, and Social History were reviewed in [employer] record.  ROS  The following are not active complaints unless bolded Hoarseness, sore throat, dysphagia, dental  problems, itching, sneezing,  nasal congestion or discharge of excess mucus or purulent secretions, ear ache,   fever, chills, sweats, unintended wt loss or wt gain, classically pleuritic or exertional cp,  orthopnea pnd or arm/hand swelling  or leg swelling, presyncope, palpitations, abdominal pain, anorexia, nausea, vomiting, diarrhea  or change in bowel habits or change in bladder habits, change in stools or change in urine, dysuria, hematuria,  rash, arthralgias, visual complaints, headache, numbness, weakness or ataxia or problems with walking or coordination,  change in mood or  memory.        Current Meds  Medication Sig   acetaminophen (TYLENOL) 500 MG tablet Take 500 mg by mouth every 6 (six) hours as needed for mild pain or moderate pain.   apixaban (ELIQUIS) 5 MG TABS tablet Take 1 tablet (5 mg total) by mouth 2 (two) times daily.   carvedilol (COREG) 3.125 MG tablet Take 1 tablet (3.125 mg total) by mouth 2 (two) times daily. NEEDS FOLLOW UP APPOINTMENT FOR ANYMORE REFILLS   dicyclomine (BENTYL) 10 MG capsule Take 1 capsule (10 mg total) by mouth 4 (four) times daily as needed for spasms.   ELIQUIS 5 MG TABS tablet TAKE 1 TABLET BY MOUTH TWICE DAILY.  NEED  FOLLOW  UP  APPOINTMENT  FOR  MORE  REFILLS.   eplerenone (INSPRA) 25 MG tablet TAKE 1 TABLET BY MOUTH ONCE DAILY.   famotidine (PEPCID) 20 MG tablet One after supper   levothyroxine (SYNTHROID) 75 MCG tablet TAKE 1 TABLET BY MOUTH ONCE DAILY BEFORE BREAKFAST   pantoprazole (PROTONIX) 40 MG tablet Take 1 tablet (40 mg total) by mouth daily. Take 30-60 min before first meal of the day   rosuvastatin (CRESTOR) 5 MG tablet Take 1 tablet (5 mg total) by  mouth daily.   valsartan (DIOVAN) 80 MG tablet Take 1 tablet (80 mg total) by mouth 2 (two) times daily.           Past Medical History:  Diagnosis Date   AICD (automatic cardioverter/defibrillator) present 09   [employer]- replaced 2016   Arthritis    Bradycardia    Breast cancer (HCC) 02/24/2016   s/p lumpectomy and radiation but refused chemotherapy   Chronic systolic CHF (congestive heart failure) (HCC)    Cough due to angiotensin-converting enzyme inhibitor    Dizziness    Dizziness with standing after squatting, March, 2012   Dyslipidemia    LDL elevation,Patient does not want statin   Family history of breast cancer    GERD (gastroesophageal reflux disease)    H/O shortness of breath    CPX 10/18:  No evidence of cardiopulmonary limitation. Suspect exercise intolerance related to weight and deconditioning.    Hypothyroidism    ICD (implantable cardiac defibrillator) battery depletion    Dr. Graciela Husbands, June, 2009(artifactual atrial tachycardia response events addressed by reprogramming in parentheses   Leg swelling    right leg   Mitral regurgitation    Nonischemic cardiomyopathy (HCC)    Etiology unknown, diagnosed 2008   Paroxysmal atrial fibrillation (HCC) 06/20/2022   Personal history of radiation therapy 2017   Pneumonia due to COVID-19 virus 10/2019   TIA (transient ischemic attack)    No CT or MRI abnormality, aspirin therapy       Objective:    Wts  08/29/2023        222  05/24/2023       220  05/03/2023       222  03/27/2023  221   02/06/23 214 lb 12.8 oz (97.4 kg)  01/20/23 218 lb 12.8 oz (99.2 kg)  12/20/22 219 lb (99.3 kg)    Vital signs reviewed  08/29/2023  - Note at rest 02 sats  94% on RA   General appearance:    amb bf nad       HEENT : Oropharynx  clear      Nasal turbinates nl    NECK :  without  apparent JVD/ palpable Nodes/TM    LUNGS: no acc muscle use,  Nl contour chest which is clear to A and P bilaterally without cough  on insp or exp maneuvers   CV:  RRR  no s3 or murmur or increase in P2, and no edema   ABD:  soft and nontender with nl inspiratory excursion in the supine position. No bruits or organomegaly appreciated   MS:  Nl gait/ ext warm without deformities Or obvious joint restrictions  calf tenderness, cyanosis or clubbing    SKIN: warm and dry without lesions    NEURO:  alert, approp, nl sensorium with  no motor or cerebellar deficits apparent.       Assessment

## 2023-08-29 NOTE — Patient Instructions (Signed)
Finish your pantoprazole bottle and when you are done add pepcid (fomotidine) 20 mg after bfast to the one you already take after supper  After a couple of weeks you can try off of one the pepcid doses and after few more weeks try off the other - go back the previous step if you flare  Pantoprazole = prilosec otc x 20 mg x 2    If you are satisfied with your treatment plan,  let your doctor know and he/she can either refill your medications or you can return here when your prescription runs out.     If in any way you are not 100% satisfied,  please tell us.  If 100% better, tell your friends!  Pulmonary follow up is as needed

## 2023-08-30 ENCOUNTER — Encounter: Payer: Self-pay | Admitting: Internal Medicine

## 2023-08-30 DIAGNOSIS — E7849 Other hyperlipidemia: Secondary | ICD-10-CM

## 2023-08-30 MED ORDER — ROSUVASTATIN CALCIUM 10 MG PO TABS: 10.0000 mg | ORAL_TABLET | Freq: Every day | ORAL | 3 refills | Status: DC

## 2023-08-30 NOTE — Progress Notes (Signed)
Remote ICD transmission.   

## 2023-09-01 ENCOUNTER — Ambulatory Visit (INDEPENDENT_AMBULATORY_CARE_PROVIDER_SITE_OTHER): Payer: Medicare HMO

## 2023-09-01 ENCOUNTER — Ambulatory Visit (INDEPENDENT_AMBULATORY_CARE_PROVIDER_SITE_OTHER): Payer: Medicare HMO | Admitting: Family Medicine

## 2023-09-01 ENCOUNTER — Encounter: Payer: Self-pay | Admitting: Family Medicine

## 2023-09-01 VITALS — BP 135/95 | HR 80 | Temp 97.5°F | Ht 67.0 in | Wt 225.2 lb

## 2023-09-01 DIAGNOSIS — K5900 Constipation, unspecified: Secondary | ICD-10-CM | POA: Diagnosis not present

## 2023-09-01 DIAGNOSIS — R1084 Generalized abdominal pain: Secondary | ICD-10-CM | POA: Diagnosis not present

## 2023-09-01 DIAGNOSIS — K5904 Chronic idiopathic constipation: Secondary | ICD-10-CM | POA: Diagnosis not present

## 2023-09-01 DIAGNOSIS — R109 Unspecified abdominal pain: Secondary | ICD-10-CM | POA: Diagnosis not present

## 2023-09-01 MED ORDER — POLYETHYLENE GLYCOL 3350 17 GM/SCOOP PO POWD
17.0000 g | Freq: Every day | ORAL | 1 refills | Status: DC
Start: 2023-09-01 — End: 2023-11-27

## 2023-09-01 NOTE — Progress Notes (Signed)
Subjective:  Patient ID: [name] [name], female    DOB: 1961/06/09, 62 y.o.   MRN: [id number]  Patient Care Team: [name], DO as PCP - General (Family Medicine) [name], MD as PCP - Cardiology (Cardiology) [name], MD as Consulting Physician (Hematology and Oncology) [name], MD as Consulting Physician (Radiation Oncology) [name], MD as Consulting Physician (General Surgery) [name], MD as Consulting Physician (Cardiology) [name], MD as Consulting Physician (Cardiology)   Chief Complaint:  Bloated and Abdominal Pain (X 1 week )   HPI: NAN [name] [name] is a 62 y.o. female presenting on 09/01/2023 for Bloated and Abdominal Pain (X 1 week )   Discussed the use of AI scribe software for clinical note transcription with the patient, who gave verbal consent to proceed.  History of Present Illness   The patient, with a history of IBS and chronic constipation, presents with a two-week history of worsening abdominal bloating and constipation. She describes a sensation of 'knots' in the upper abdomen and discomfort in the lower abdomen, particularly when sitting. She also reports changes in stool caliber, alternating between large and flat stools.  Previously, the patient was on Bentyl for IBS, which she discontinued when bowel movements became regular. However, with the recent exacerbation of symptoms, she expressed a desire to restart Bentyl. She denies any fevers or blood in the stool.  Despite drinking plenty of water, the patient's symptoms have persisted. She expresses embarrassment about her condition and is eager to find a solution.          Relevant past medical, surgical, family, and social history reviewed and updated as indicated.  Allergies and medications reviewed and updated. Data reviewed: Chart in Epic.   Past Medical History:  Diagnosis Date   AICD (automatic cardioverter/defibrillator) present 09   [employer]-  replaced 2016   Arthritis    Bradycardia    Breast cancer (HCC) 02/24/2016   s/p lumpectomy and radiation but refused chemotherapy   Chronic systolic CHF (congestive heart failure) (HCC)    Cough due to angiotensin-converting enzyme inhibitor    Dizziness    Dizziness with standing after squatting, March, 2012   Dyslipidemia    LDL elevation,Patient does not want statin   Family history of breast cancer    GERD (gastroesophageal reflux disease)    H/O shortness of breath    CPX 10/18:  No evidence of cardiopulmonary limitation. Suspect exercise intolerance related to weight and deconditioning.    Hypothyroidism    ICD (implantable cardiac defibrillator) battery depletion    Dr. Graciela Husbands, June, 2009(artifactual atrial tachycardia response events addressed by reprogramming in parentheses   Leg swelling    right leg   Mitral regurgitation    Nonischemic cardiomyopathy (HCC)    Etiology unknown, diagnosed 2008   Paroxysmal atrial fibrillation (HCC) 06/20/2022   Personal history of radiation therapy 2017   Pneumonia due to COVID-19 virus 10/2019   TIA (transient ischemic attack)    No CT or MRI abnormality, aspirin therapy    Past Surgical History:  Procedure Laterality Date   BREAST BIOPSY Left 02/24/2016   U/S Core   BREAST BIOPSY Left 02/24/2016   U/S Core   BREAST LUMPECTOMY Left 04/05/2016   invasive ductal    BREAST LUMPECTOMY WITH RADIOACTIVE SEED AND SENTINEL LYMPH NODE BIOPSY Left 04/05/2016   Procedure: BREAST LUMPECTOMY WITH RADIOACTIVE SEED AND SENTINEL LYMPH NODE BIOPSY;  Surgeon: [name], MD;  Location:  [facility] OR;  Service: General;  Laterality: Left;   CARDIAC CATHETERIZATION N/A 08/05/2016   Procedure: Left Heart Cath and Coronary Angiography;  Surgeon: Peter M Sw[place]and, MD;  Location: [facility] INVASIVE CV LAB;  Service: Cardiovascular;  Laterality: N/A;   CARDIAC DEFIBRILLATOR PLACEMENT  09   [employer] no remote   COLONOSCOPY     CYST EXCISION Left    back of  leg-lt    IMPLANTABLE CARDIOVERTER DEFIBRILLATOR GENERATOR CHANGE N/A 12/29/2014   Procedure: IMPLANTABLE CARDIOVERTER DEFIBRILLATOR GENERATOR CHANGE;  Surgeon: [name], MD;  Location: [facility] CATH LAB;  Service: Cardiovascular;  Laterality: N/A;   LATERAL EPICONDYLE RELEASE  10/11/2012   Procedure: TENNIS ELBOW RELEASE;  Surgeon: Loreta Ave, MD;  Location: [facility];  Service: Orthopedics;  Laterality: Right;  RIGHT ELBOW: TENOTOMY ELBOW LATERAL EPICONDYLITIS TENNIS ELBOW: RADIAL TUNNEL RELEASE   PORT-A-CATH REMOVAL  04/13/2016   Procedure: MINOR REMOVAL PORT-A-CATH;  Surgeon: Manus Rudd, MD;  Location:  SURGERY CENTER;  Service: General;;   PORTACATH PLACEMENT Right 04/05/2016   Procedure: INSERTION PORT-A-CATH WITH [place];  Surgeon: [name], MD;  Location: [facility] OR;  Service: General;  Laterality: Right;  right IJ   TOTAL THYROIDECTOMY  2001   TUBAL LIGATION  94   UPPER GI ENDOSCOPY      Social History   Socioeconomic History   Marital status: Widowed    Spouse name: Not on file   Number of children: Not on file   Years of education: Not on file   Highest education level: Some college, no degree  Occupational History   Occupation: Widow  Tobacco Use   Smoking status: Never   Smokeless tobacco: Never  Vaping Use   Vaping status: Never Used  Substance and Sexual Activity   Alcohol use: No   Drug use: No   Sexual activity: Not Currently    Birth control/protection: Surgical    Comment: tubal  Other Topics Concern   Not on file  Social History Narrative   Not on file   Social Determinants of Health   Financial Resource Strain: High Risk (08/29/2023)   Overall Financial Resource Strain (CARDIA)    Difficulty of Paying Living Expenses: Hard  Food Insecurity: No Food Insecurity (08/29/2023)   Hunger Vital Sign    Worried About Running Out of Food in the Last Year: Never true    Ran Out of Food in the Last Year: Never true  Transportation Needs:  No Transportation Needs (08/29/2023)   PRAPARE - Administrator, Civil Service (Medical): No    Lack of Transportation (Non-Medical): No  Physical Activity: Unknown (08/29/2023)   Exercise Vital Sign    Days of Exercise per Week: 0 days    Minutes of Exercise per Session: Not on file  Stress: No Stress Concern Present (08/29/2023)   [employer] of Occupational Health - Occupational Stress Questionnaire    Feeling of Stress : Not at all  Social Connections: Moderately Integrated (08/29/2023)   Social Connection and Isolation Panel [NHANES]    Frequency of Communication with Friends and Family: More than three times a week    Frequency of Social Gatherings with Friends and Family: More than three times a week    Attends Religious Services: More than 4 times per year    Active Member of [employer] or Organizations: Yes    Attends Banker Meetings: More than 4 times per year    Marital Status: Widowed  Intimate  Partner Violence: Unknown (01/25/2022)   [facility] from [facility]   HITS    Physically Hurt: Not on file    Insult or Talk Down To: Not on file    Threaten Physical Harm: Not on file    Scream or Curse: Not on file    Outpatient Encounter Medications as of 09/01/2023  Medication Sig   acetaminophen (TYLENOL) 500 MG tablet Take 500 mg by mouth every 6 (six) hours as needed for mild pain or moderate pain.   apixaban (ELIQUIS) 5 MG TABS tablet Take 1 tablet (5 mg total) by mouth 2 (two) times daily.   carvedilol (COREG) 3.125 MG tablet Take 1 tablet (3.125 mg total) by mouth 2 (two) times daily. NEEDS FOLLOW UP APPOINTMENT FOR ANYMORE REFILLS   chlorpheniramine (CHLOR-TRIMETON) 4 MG tablet Take 4 mg by mouth every 4 (four) hours as needed for allergies.   dicyclomine (BENTYL) 10 MG capsule Take 1 capsule (10 mg total) by mouth 4 (four) times daily as needed for spasms.   ELIQUIS 5 MG TABS tablet TAKE 1 TABLET BY MOUTH TWICE DAILY.  NEED  FOLLOW   UP  APPOINTMENT  FOR  MORE  REFILLS.   eplerenone (INSPRA) 25 MG tablet TAKE 1 TABLET BY MOUTH ONCE DAILY.   famotidine (PEPCID) 20 MG tablet One after supper   levothyroxine (SYNTHROID) 75 MCG tablet TAKE 1 TABLET BY MOUTH ONCE DAILY BEFORE BREAKFAST   pantoprazole (PROTONIX) 40 MG tablet Take 1 tablet (40 mg total) by mouth daily. Take 30-60 min before first meal of the day   polyethylene glycol powder (GLYCOLAX/MIRALAX) 17 GM/SCOOP powder Take 17 g by mouth daily.   rosuvastatin (CRESTOR) 10 MG tablet Take 1 tablet (10 mg total) by mouth daily.   valsartan (DIOVAN) 80 MG tablet Take 1 tablet (80 mg total) by mouth 2 (two) times daily.   [DISCONTINUED] magic mouthwash (nystatin, lidocaine, diphenhydrAMINE, alum & mag hydroxide) suspension Swish and spit 5 mLs 4 (four) times daily as needed for mouth pain.   No facility-administered encounter medications on file as of 09/01/2023.    Allergies  Allergen Reactions   Avelox [Moxifloxacin Hcl In Nacl] Shortness Of Breath    Shortness of breath   Clindamycin/Lincomycin Shortness Of Breath and Diarrhea    Breathing/diarrhea     Kiwi Extract Anaphylaxis    Makes pt. Feel like her throat is closing    Bactrim [Sulfamethoxazole-Trimethoprim] Swelling   Levofloxacin Other (See Comments)    JOINT PAIN JOINT PAIN   Simvastatin Other (See Comments)    Joint pain   Spironolactone Other (See Comments)    burning   Moxifloxacin Hcl Other (See Comments)   Tramadol Nausea Only    Pertinent ROS per HPI, otherwise unremarkable      Objective:  BP (!) 135/95   Pulse 80   Temp (!) 97.5 F (36.4 C) (Temporal)   Ht 5\' 7"  (1.702 m)   Wt 225 lb 3.2 oz (102.2 kg)   SpO2 96%   BMI 35.27 kg/m    Wt Readings from Last 3 Encounters:  09/01/23 225 lb 3.2 oz (102.2 kg)  08/29/23 222 lb (100.7 kg)  08/25/23 226 lb 3.2 oz (102.6 kg)    Physical Exam Vitals and nursing note reviewed.  Constitutional:      General: She is not in acute  distress.    Appearance: She is well-developed. She is obese. She is not ill-appearing, toxic-appearing or diaphoretic.  HENT:  Head: Normocephalic and atraumatic.     Nose: Nose normal.     Mouth/Throat:     Mouth: Mucous membranes are moist.  Eyes:     Conjunctiva/sclera: Conjunctivae normal.     Pupils: Pupils are equal, round, and reactive to light.  Cardiovascular:     Rate and Rhythm: Normal rate and regular rhythm.     Heart sounds: Normal heart sounds.  Pulmonary:     Effort: Pulmonary effort is normal.     Breath sounds: Normal breath sounds.  Abdominal:     General: Abdomen is protuberant. Bowel sounds are normal. There is no distension or abdominal bruit. There are no signs of injury.     Tenderness: There is abdominal tenderness in the right upper quadrant, epigastric area, periumbilical area and left upper quadrant. There is no right CVA tenderness, left CVA tenderness, guarding or rebound.     Hernia: No hernia is present.  Musculoskeletal:     Cervical back: Normal range of motion and neck supple.     Right lower leg: No edema.     Left lower leg: No edema.  Skin:    General: Skin is warm and dry.     Capillary Refill: Capillary refill takes less than 2 seconds.  Neurological:     General: No focal deficit present.     Mental Status: She is alert and oriented to person, place, and time.  Psychiatric:        Mood and Affect: Mood normal.        Behavior: Behavior normal.        Thought Content: Thought content normal.        Judgment: Judgment normal.      Results for orders placed or performed in visit on 08/25/23  Lipoprotein A (LPA)  Result Value Ref Range   Lipoprotein (a) 325.7 (H) <75.0 nmol/L  NMR, lipoprofile  Result Value Ref Range   LDL Particle Number 1,[id number] (H) <1,000 nmol/L   LDL-C (NIH Calc) 105 (H) 0 - 99 mg/dL   HDL-C 73 >81 mg/dL   Triglycerides 65 0 - 149 mg/dL   Cholesterol, Total 191 100 - 199 mg/dL   HDL Particle Number 47.8  >=29.5 umol/L   Small LDL Particle Number 383 <=527 nmol/L   LDL Size 21.4 >20.5 nm   LP-IR Score <25 <=45  Apolipoprotein B  Result Value Ref Range   Apolipoprotein B 81 <90 mg/dL  TSH  Result Value Ref Range   TSH 1.380 0.450 - 4.500 uIU/mL       Pertinent labs & imaging results that were available during my care of the patient were reviewed by me and considered in my medical decision making.  Assessment & Plan:  Akeila was seen today for bloated and abdominal pain.  Diagnoses and all orders for this visit:  Generalized abdominal pain -     DG Abd 1 View  Chronic idiopathic constipation -     polyethylene glycol powder (GLYCOLAX/MIRALAX) 17 GM/SCOOP powder; Take 17 g by mouth daily.     Assessment and Plan    Constipation Chronic constipation with recent exacerbation over the past two weeks. Reports abdominal bloating, pain, and sensation of fullness. X-ray shows significant stool accumulation throughout the bowel and a large ball of stool in the rectum. No signs of obstruction, fever, or blood in stool. Stool consistency varies from large to flat and skinny. Discussed risks of severe abdominal pain and repetitive vomiting with Miralax clean out. Explained  benefits of increased water and fiber intake, and daily walking to stimulate peristalsis. Discussed alternative medications like Linzess, Trulance, or Amitiza if Miralax does not maintain regular bowel movements. - Initiate Miralax clean out: one capful in 32 ounces of water or non-red/orange Gatorade, consumed over 1-2 hours. - Continue Miralax one capful daily until achieving regular bowel movements. - Adjust Miralax dosage to half capful or every other day if stools become too loose. - Increase water and fiber intake. - Engage in daily walking for 20-30 minutes to stimulate peristalsis. - Send Miralax prescription to pharmacy. - Print and provide detailed instructions for Miralax clean out. - Advise to seek medical  attention if severe abdominal pain or repetitive vomiting occurs.  Irritable Bowel Syndrome (IBS) with Constipation IBS with constipation, previously managed with Bentyl. Discontinued Bentyl due to improvement but experienced a recent flare-up. Bentyl is available for use if significant cramping occurs, though expresses concern about potential constipation side effect. Discussed that Bentyl can cause constipation but is effective for significant cramping associated with IBS. - Use Bentyl as needed for significant cramping. - Consider alternative medications like Linzess, Trulance, or Amitiza if Miralax does not maintain regular bowel movements.          Continue all other maintenance medications.  Follow up plan: Return if symptoms worsen or fail to improve.   Continue healthy lifestyle choices, including diet (rich in fruits, vegetables, and lean proteins, and low in salt and simple carbohydrates) and exercise (at least 30 minutes of moderate physical activity daily).  Educational handout given for miralax clean out  The above assessment and management plan was discussed with the patient. The patient verbalized understanding of and has agreed to the management plan. Patient is aware to call the clinic if they develop any new symptoms or if symptoms persist or worsen. Patient is aware when to return to the clinic for a follow-up visit. Patient educated on when it is appropriate to go to the emergency department.   Kari Baars, FNP-C [facility] [phone]

## 2023-09-01 NOTE — Patient Instructions (Signed)
Thank you for coming in to clinic today.  1. Your symptoms are consistent with Constipation, likely cause of your General Abdominal Pain / Cramping. 2. Start with Miralax, prescription was sent to pharmacy. First dose 68g (4 capfuls) in 32oz water over 1 to 2 hours for clean out. Next day start 17g or 1 capful daily, may adjust dose up or down by half a capful every few days. Recommend to take this medicine daily for next 1-2 weeks, you may need to use it longer if needed. - Goal is to have soft regular bowel movement 1-3x daily, if too runny or diarrhea, then reduce dose of the medicine to every other day.  Improve water intake, hydration will help Also recommend increased vegetables, fruits, fiber intake Can try daily Metamucil or Fiber supplement at pharmacy over the counter  Follow-up if symptoms are not improving with bowel movements, or if pain worsens, develop fevers, nausea, vomiting.  Please schedule a follow-up appointment with Michelle Rakes, FNP, in 1 month to follow-up Constipation  If you have any other questions or concerns, please feel free to call the clinic to contact me. You may also schedule an earlier appointment if necessary.  However, if your symptoms get significantly worse, please go to the Emergency Department to seek immediate medical attention.  

## 2023-09-05 ENCOUNTER — Telehealth: Payer: Self-pay

## 2023-09-05 NOTE — Telephone Encounter (Signed)
Transmission received.  Afib burden is 0%.  Brief NSVT noted 4-8 beats.

## 2023-09-05 NOTE — Telephone Encounter (Signed)
Request received from Dr. Tenny Craw to check Pt's afib burden.  Message sent to Pt requesting she send a remote transmission.

## 2023-09-12 ENCOUNTER — Encounter: Payer: Self-pay | Admitting: Family Medicine

## 2023-09-12 ENCOUNTER — Telehealth: Payer: Medicare HMO | Admitting: Family Medicine

## 2023-09-12 DIAGNOSIS — K121 Other forms of stomatitis: Secondary | ICD-10-CM | POA: Diagnosis not present

## 2023-09-12 MED ORDER — LIDOCAINE VISCOUS HCL 2 % MT SOLN
5.0000 mL | Freq: Three times a day (TID) | ORAL | 0 refills | Status: DC
Start: 2023-09-12 — End: 2023-09-13

## 2023-09-12 NOTE — Progress Notes (Signed)
Virtual Visit via Video   I connected with patient on 09/12/23 at 1510 by a video enabled telemedicine application and verified that I am speaking with the correct person using two identifiers.  Location patient: Home Location provider: [facility] Office Persons participating in the virtual visit: Patient and Provider  I discussed the limitations of evaluation and management by telemedicine and the availability of in person appointments. The patient expressed understanding and agreed to proceed.  Subjective:   HPI:  Pt presents today for  Chief Complaint  Patient presents with   Oral Pain   Recent dental work, formation of sore spot to tongue after having this completed.   Oral Pain  This is a new problem. The current episode started in the past 7 days. The problem occurs constantly. The problem has been unchanged. The pain is at a severity of 3/10. Pertinent negatives include no difficulty swallowing, facial pain, fever, oral bleeding, sinus pressure or thermal sensitivity. She has tried nothing for the symptoms. The treatment provided no relief.     Review of Systems  Constitutional:  Negative for fever.  HENT:  Negative for sinus pressure.      Patient Active Problem List   Diagnosis Date Noted   Laryngopharyngeal reflux (LPR) 05/31/2023   Tinnitus of both ears 05/31/2023   Deep venous thrombosis of lower extremity (HCC) 05/24/2023   Upper airway cough syndrome 11/28/2022   DOE (dyspnea on exertion) 11/28/2022   Paroxysmal atrial fibrillation (HCC) 06/20/2022   Urge incontinence of urine 05/17/2022   Obesity (BMI 30-39.9) 04/13/2022   Acute deep vein thrombosis (DVT) of right popliteal vein (HCC) 04/13/2022   Acute pulmonary embolism (HCC) 04/12/2022   Neuropathy 05/19/2021   Goiter 05/10/2021   Vestibulitis, vulvar 05/10/2021   Atrophic vaginitis 05/10/2021   Hypertensive disorder 05/10/2021   History of malignant neoplasm of breast  05/10/2021   Osteopenia 04/14/2021   NICM (nonischemic cardiomyopathy) (HCC) 07/23/2020   Pulmonary embolism (HCC) 04/21/2020   Post-surgical hypothyroidism 03/27/2020   Lumbar arthropathy 07/15/2019   Anemia 07/15/2019   History of colonic polyps 05/07/2019   Lumbar spondylosis with myelopathy 04/19/2019   Genetic testing 11/22/2016   Family history of breast cancer    Breast cancer- radiation June 2016 08/05/2016   Abnormal stress test 08/01/2016   Cardiac LV ejection fraction 30-35% 07/18/2016   Heart disease 04/22/2016   Arm numbness 03/14/2016   Facial numbness 03/14/2016   Breast cancer of upper-inner quadrant of left female breast (HCC) 03/09/2016   Chronic combined systolic and diastolic HF (heart failure) (HCC) 02/26/2014   Ejection fraction < 50%    Itching    Dizziness    Bradycardia    GERD (gastroesophageal reflux disease)    Acquired hypothyroidism    Hyperlipidemia    Leg swelling    Cough due to angiotensin-converting enzyme inhibitor    Dual implantable cardioverter-defibrillator in situ    FOOT PAIN 01/20/2010   History of TIA 2007 01/19/2010    Social History   Tobacco Use   Smoking status: Never   Smokeless tobacco: Never  Substance Use Topics   Alcohol use: No    Current Outpatient Medications:    magic mouthwash (lidocaine, diphenhydrAMINE, alum & mag hydroxide) suspension, Swish and spit 5 mLs 3 (three) times daily., Disp: 360 mL, Rfl: 0   acetaminophen (TYLENOL) 500 MG tablet, Take 500 mg by mouth every 6 (six) hours as needed for mild pain or moderate pain., Disp: , Rfl:  apixaban (ELIQUIS) 5 MG TABS tablet, Take 1 tablet (5 mg total) by mouth 2 (two) times daily., Disp: 28 tablet, Rfl: 0   carvedilol (COREG) 3.125 MG tablet, Take 1 tablet (3.125 mg total) by mouth 2 (two) times daily. NEEDS FOLLOW UP APPOINTMENT FOR ANYMORE REFILLS, Disp: 180 tablet, Rfl: 3   chlorpheniramine (CHLOR-TRIMETON) 4 MG tablet, Take 4 mg by mouth every 4 (four)  hours as needed for allergies., Disp: , Rfl:    dicyclomine (BENTYL) 10 MG capsule, Take 1 capsule (10 mg total) by mouth 4 (four) times daily as needed for spasms., Disp: 120 capsule, Rfl: 1   ELIQUIS 5 MG TABS tablet, TAKE 1 TABLET BY MOUTH TWICE DAILY.  NEED  FOLLOW  UP  APPOINTMENT  FOR  MORE  REFILLS., Disp: 180 tablet, Rfl: 0   eplerenone (INSPRA) 25 MG tablet, TAKE 1 TABLET BY MOUTH ONCE DAILY., Disp: 90 tablet, Rfl: 3   famotidine (PEPCID) 20 MG tablet, One after supper, Disp: 30 tablet, Rfl: 11   levothyroxine (SYNTHROID) 75 MCG tablet, TAKE 1 TABLET BY MOUTH ONCE DAILY BEFORE BREAKFAST, Disp: 90 tablet, Rfl: 2   pantoprazole (PROTONIX) 40 MG tablet, Take 1 tablet (40 mg total) by mouth daily. Take 30-60 min before first meal of the day, Disp: 30 tablet, Rfl: 2   polyethylene glycol powder (GLYCOLAX/MIRALAX) 17 GM/SCOOP powder, Take 17 g by mouth daily., Disp: 3350 g, Rfl: 1   rosuvastatin (CRESTOR) 10 MG tablet, Take 1 tablet (10 mg total) by mouth daily., Disp: 90 tablet, Rfl: 3   valsartan (DIOVAN) 80 MG tablet, Take 1 tablet (80 mg total) by mouth 2 (two) times daily., Disp: 60 tablet, Rfl: 11  Allergies  Allergen Reactions   Avelox [Moxifloxacin Hcl In Nacl] Shortness Of Breath    Shortness of breath   Clindamycin/Lincomycin Shortness Of Breath and Diarrhea    Breathing/diarrhea     Kiwi Extract Anaphylaxis    Makes pt. Feel like her throat is closing    Bactrim [Sulfamethoxazole-Trimethoprim] Swelling   Levofloxacin Other (See Comments)    JOINT PAIN JOINT PAIN   Simvastatin Other (See Comments)    Joint pain   Spironolactone Other (See Comments)    burning   Moxifloxacin Hcl Other (See Comments)   Tramadol Nausea Only    Objective:   There were no vitals taken for this visit.  Patient is well-developed, well-nourished in no acute distress.  Resting comfortably at home.  Head is normocephalic, atraumatic.  No labored breathing.  Speech is clear and coherent  with logical content.  Patient is alert and oriented at baseline.  Small aphthous ulcer to right lateral tongue.   Assessment and Plan:   Tyrene was seen today for oral pain.  Diagnoses and all orders for this visit:  Ulcer of mouth Will treat with below. Pt aware if symptoms persist or worsen, she needs to be evaluated in office.  -     magic mouthwash (lidocaine, diphenhydrAMINE, alum & mag hydroxide) suspension; Swish and spit 5 mLs 3 (three) times daily.      Return if symptoms worsen or fail to improve.  Kari Baars, FNP-C [facility] [address] [place], [place] [address] [phone]  09/12/2023  Time spent with the patient: 12 minutes, of which >50% was spent in obtaining information about symptoms, reviewing previous labs, evaluations, and treatments, counseling about condition (please see the discussed topics above), and developing a plan to further investigate it; had a number  of questions which I addressed.

## 2023-09-13 MED ORDER — DIPHENHYDRAMINE HCL 12.5 MG/5ML PO LIQD: 5.0000 mL | Freq: Three times a day (TID) | ORAL | 0 refills | Status: DC

## 2023-10-02 ENCOUNTER — Other Ambulatory Visit (HOSPITAL_COMMUNITY): Payer: Self-pay

## 2023-10-04 ENCOUNTER — Other Ambulatory Visit (HOSPITAL_COMMUNITY): Payer: Self-pay

## 2023-10-06 ENCOUNTER — Ambulatory Visit (INDEPENDENT_AMBULATORY_CARE_PROVIDER_SITE_OTHER): Payer: Medicare HMO | Admitting: Family Medicine

## 2023-10-06 VITALS — BP 114/79 | HR 73 | Temp 97.4°F | Ht 67.0 in | Wt 225.4 lb

## 2023-10-06 DIAGNOSIS — R1084 Generalized abdominal pain: Secondary | ICD-10-CM | POA: Diagnosis not present

## 2023-10-06 DIAGNOSIS — K5904 Chronic idiopathic constipation: Secondary | ICD-10-CM

## 2023-10-06 MED ORDER — MAGNESIUM CITRATE PO SOLN
1.0000 | Freq: Once | ORAL | 0 refills | Status: AC
Start: 2023-10-06 — End: 2023-10-06

## 2023-10-06 NOTE — Progress Notes (Signed)
Subjective:  Patient ID: [name] [name], female    DOB: 1960-11-29, 62 y.o.   MRN: [id number]  Patient Care Team: [name], DO as PCP - General (Family Medicine) [name], MD as PCP - Cardiology (Cardiology) [name], MD as Consulting Physician (Hematology and Oncology) [name], MD as Consulting Physician (Radiation Oncology) [name], MD as Consulting Physician (General Surgery) [name], MD as Consulting Physician (Cardiology) [name], MD as Consulting Physician (Cardiology)   Chief Complaint:  Abdominal Pain (1 month follow up- States she is having BM's but still having left flank pain )   HPI: [name] [name] is a 62 y.o. female presenting on 10/06/2023 for Abdominal Pain (1 month follow up- States she is having BM's but still having left flank pain )   Discussed the use of AI scribe software for clinical note transcription with the patient, who gave verbal consent to proceed.  History of Present Illness   The patient, with a history of significant constipation and polyps, presents with intermittent left-sided abdominal pain. The pain is described as sharp and brief, often preceding a bowel movement. The patient reports that the pain is not constant and does not result in nausea or vomiting.  Despite a recent bowel cleanout, the patient still experiences a sensation of incomplete evacuation. They attempted to manage this with Miralax, but discontinued due to excessively loose stools. The patient also reports a history of polyps and is due for a colonoscopy next year. However, they are currently seeking a new provider due to insurance changes.          Relevant past medical, surgical, family, and social history reviewed and updated as indicated.  Allergies and medications reviewed and updated. Data reviewed: Chart in Epic.   Past Medical History:  Diagnosis Date   AICD (automatic cardioverter/defibrillator) present 09   boston  scientific- replaced 2016   Arthritis    Bradycardia    Breast cancer (HCC) 02/24/2016   s/p lumpectomy and radiation but refused chemotherapy   Chronic systolic CHF (congestive heart failure) (HCC)    Cough due to angiotensin-converting enzyme inhibitor    Dizziness    Dizziness with standing after squatting, March, 2012   Dyslipidemia    LDL elevation,Patient does not want statin   Family history of breast cancer    GERD (gastroesophageal reflux disease)    H/O shortness of breath    CPX 10/18:  No evidence of cardiopulmonary limitation. Suspect exercise intolerance related to weight and deconditioning.    Hypothyroidism    ICD (implantable cardiac defibrillator) battery depletion    Dr. Graciela Husbands, June, 2009(artifactual atrial tachycardia response events addressed by reprogramming in parentheses   Leg swelling    right leg   Mitral regurgitation    Nonischemic cardiomyopathy (HCC)    Etiology unknown, diagnosed 2008   Paroxysmal atrial fibrillation (HCC) 06/20/2022   Personal history of radiation therapy 2017   Pneumonia due to COVID-19 virus 10/2019   TIA (transient ischemic attack)    No CT or MRI abnormality, aspirin therapy    Past Surgical History:  Procedure Laterality Date   BREAST BIOPSY Left 02/24/2016   U/S Core   BREAST BIOPSY Left 02/24/2016   U/S Core   BREAST LUMPECTOMY Left 04/05/2016   invasive ductal    BREAST LUMPECTOMY WITH RADIOACTIVE SEED AND SENTINEL LYMPH NODE BIOPSY Left 04/05/2016   Procedure: BREAST LUMPECTOMY WITH RADIOACTIVE SEED AND SENTINEL LYMPH NODE BIOPSY;  Surgeon: [name], MD;  Location: [facility] OR;  Service: General;  Laterality: Left;   CARDIAC CATHETERIZATION N/A 08/05/2016   Procedure: Left Heart Cath and Coronary Angiography;  Surgeon: Peter M Swaziland, MD;  Location: [facility] INVASIVE CV LAB;  Service: Cardiovascular;  Laterality: N/A;   CARDIAC DEFIBRILLATOR PLACEMENT  09   [employer] no remote   COLONOSCOPY     CYST EXCISION Left     back of leg-lt    IMPLANTABLE CARDIOVERTER DEFIBRILLATOR GENERATOR CHANGE N/A 12/29/2014   Procedure: IMPLANTABLE CARDIOVERTER DEFIBRILLATOR GENERATOR CHANGE;  Surgeon: [name], MD;  Location: [facility] CATH LAB;  Service: Cardiovascular;  Laterality: N/A;   LATERAL EPICONDYLE RELEASE  10/11/2012   Procedure: TENNIS ELBOW RELEASE;  Surgeon: Loreta Ave, MD;  Location: [facility];  Service: Orthopedics;  Laterality: Right;  RIGHT ELBOW: TENOTOMY ELBOW LATERAL EPICONDYLITIS TENNIS ELBOW: RADIAL TUNNEL RELEASE   PORT-A-CATH REMOVAL  04/13/2016   Procedure: MINOR REMOVAL PORT-A-CATH;  Surgeon: Manus Rudd, MD;  Location: [facility];  Service: General;;   PORTACATH PLACEMENT Right 04/05/2016   Procedure: INSERTION PORT-A-CATH WITH [place];  Surgeon: [name], MD;  Location: [facility] OR;  Service: General;  Laterality: Right;  right IJ   TOTAL THYROIDECTOMY  2001   TUBAL LIGATION  94   UPPER GI ENDOSCOPY      Social History   Socioeconomic History   Marital status: Widowed    Spouse name: Not on file   Number of children: Not on file   Years of education: Not on file   Highest education level: Some college, no degree  Occupational History   Occupation: Widow  Tobacco Use   Smoking status: Never   Smokeless tobacco: Never  Vaping Use   Vaping status: Never Used  Substance and Sexual Activity   Alcohol use: No   Drug use: No   Sexual activity: Not Currently    Birth control/protection: Surgical    Comment: tubal  Other Topics Concern   Not on file  Social History Narrative   Not on file   Social Drivers of Health   Financial Resource Strain: Medium Risk (10/06/2023)   Overall Financial Resource Strain (CARDIA)    Difficulty of Paying Living Expenses: Somewhat hard  Food Insecurity: Food Insecurity Present (10/06/2023)   Hunger Vital Sign    Worried About Running Out of Food in the Last Year: Sometimes true    Ran Out of Food in the Last Year: Never  true  Transportation Needs: No Transportation Needs (10/06/2023)   PRAPARE - Administrator, Civil Service (Medical): No    Lack of Transportation (Non-Medical): No  Physical Activity: Unknown (10/06/2023)   Exercise Vital Sign    Days of Exercise per Week: 0 days    Minutes of Exercise per Session: Not on file  Stress: No Stress Concern Present (10/06/2023)   [employer] of Occupational Health - Occupational Stress Questionnaire    Feeling of Stress : Only a little  Social Connections: Moderately Integrated (10/06/2023)   Social Connection and Isolation Panel [NHANES]    Frequency of Communication with Friends and Family: More than three times a week    Frequency of Social Gatherings with Friends and Family: Twice a week    Attends Religious Services: More than 4 times per year    Active Member of [employer] or Organizations: Yes    Attends Banker Meetings: More than 4 times per year    Marital  Status: Widowed  Intimate Partner Violence: Unknown (01/25/2022)   Received from [facility]   HITS    Physically Hurt: Not on file    Insult or Talk Down To: Not on file    Threaten Physical Harm: Not on file    Scream or Curse: Not on file    Outpatient Encounter Medications as of 10/06/2023  Medication Sig   acetaminophen (TYLENOL) 500 MG tablet Take 500 mg by mouth every 6 (six) hours as needed for mild pain or moderate pain.   apixaban (ELIQUIS) 5 MG TABS tablet Take 1 tablet (5 mg total) by mouth 2 (two) times daily.   carvedilol (COREG) 3.125 MG tablet Take 1 tablet (3.125 mg total) by mouth 2 (two) times daily. NEEDS FOLLOW UP APPOINTMENT FOR ANYMORE REFILLS   chlorpheniramine (CHLOR-TRIMETON) 4 MG tablet Take 4 mg by mouth every 4 (four) hours as needed for allergies.   dicyclomine (BENTYL) 10 MG capsule Take 1 capsule (10 mg total) by mouth 4 (four) times daily as needed for spasms.   eplerenone (INSPRA) 25 MG tablet TAKE 1 TABLET BY MOUTH  ONCE DAILY.   famotidine (PEPCID) 20 MG tablet One after supper   levothyroxine (SYNTHROID) 75 MCG tablet TAKE 1 TABLET BY MOUTH ONCE DAILY BEFORE BREAKFAST   magic mouthwash (lidocaine, diphenhydrAMINE, alum & mag hydroxide) suspension Swish and spit 5 mLs 3 (three) times daily.   magnesium citrate SOLN Take 296 mLs (1 Bottle total) by mouth once for 1 dose.   pantoprazole (PROTONIX) 40 MG tablet Take 1 tablet (40 mg total) by mouth daily. Take 30-60 min before first meal of the day   polyethylene glycol powder (GLYCOLAX/MIRALAX) 17 GM/SCOOP powder Take 17 g by mouth daily.   rosuvastatin (CRESTOR) 10 MG tablet Take 1 tablet (10 mg total) by mouth daily.   valsartan (DIOVAN) 80 MG tablet Take 1 tablet (80 mg total) by mouth 2 (two) times daily.   ELIQUIS 5 MG TABS tablet TAKE 1 TABLET BY MOUTH TWICE DAILY.  NEED  FOLLOW  UP  APPOINTMENT  FOR  MORE  REFILLS. (Patient not taking: Reported on 10/06/2023)   [DISCONTINUED] magic mouthwash (nystatin, lidocaine, diphenhydrAMINE, alum & mag hydroxide) suspension Swish and spit 5 mLs 4 (four) times daily as needed for mouth pain.   No facility-administered encounter medications on file as of 10/06/2023.    Allergies  Allergen Reactions   Avelox [Moxifloxacin Hcl In Nacl] Shortness Of Breath    Shortness of breath   Clindamycin/Lincomycin Shortness Of Breath and Diarrhea    Breathing/diarrhea     Kiwi Extract Anaphylaxis    Makes pt. Feel like her throat is closing    Bactrim [Sulfamethoxazole-Trimethoprim] Swelling   Levofloxacin Other (See Comments)    JOINT PAIN JOINT PAIN   Simvastatin Other (See Comments)    Joint pain   Spironolactone Other (See Comments)    burning   Moxifloxacin Hcl Other (See Comments)   Tramadol Nausea Only    Pertinent ROS per HPI, otherwise unremarkable      Objective:  BP 114/79   Pulse 73   Temp (!) 97.4 F (36.3 C)   Ht 5\' 7"  (1.702 m)   Wt 225 lb 6.4 oz (102.2 kg)   SpO2 95%   BMI 35.30 kg/m     Wt Readings from Last 3 Encounters:  10/06/23 225 lb 6.4 oz (102.2 kg)  09/01/23 225 lb 3.2 oz (102.2 kg)  08/29/23 222 lb (100.7 kg)  Physical Exam Vitals and nursing note reviewed.  Constitutional:      General: She is not in acute distress.    Appearance: Normal appearance. She is well-developed. She is obese. She is not ill-appearing, toxic-appearing or diaphoretic.  HENT:     Head: Normocephalic and atraumatic.     Mouth/Throat:     Mouth: Mucous membranes are moist.  Eyes:     Conjunctiva/sclera: Conjunctivae normal.     Pupils: Pupils are equal, round, and reactive to light.  Cardiovascular:     Rate and Rhythm: Normal rate and regular rhythm.     Heart sounds: Normal heart sounds.  Pulmonary:     Effort: Pulmonary effort is normal.     Breath sounds: Normal breath sounds.  Abdominal:     General: Abdomen is protuberant. Bowel sounds are normal. There is no distension.     Palpations: Abdomen is soft.     Tenderness: There is no abdominal tenderness.  Musculoskeletal:     Cervical back: Neck supple.     Right lower leg: No edema.     Left lower leg: No edema.  Skin:    General: Skin is warm and dry.     Capillary Refill: Capillary refill takes less than 2 seconds.  Neurological:     General: No focal deficit present.     Mental Status: She is alert and oriented to person, place, and time.  Psychiatric:        Mood and Affect: Mood normal.        Behavior: Behavior normal.        Thought Content: Thought content normal.        Judgment: Judgment normal.     Results for orders placed or performed in visit on 08/25/23  Lipoprotein A (LPA)   Collection Time: 08/25/23 10:36 AM  Result Value Ref Range   Lipoprotein (a) 325.7 (H) <75.0 nmol/L  NMR, lipoprofile   Collection Time: 08/25/23 10:36 AM  Result Value Ref Range   LDL Particle Number 1,[id number] (H) <1,000 nmol/L   LDL-C (NIH Calc) 105 (H) 0 - 99 mg/dL   HDL-C 73 >62 mg/dL   Triglycerides 65 0 - 149  mg/dL   Cholesterol, Total 952 100 - 199 mg/dL   HDL Particle Number 84.1 >=30.5 umol/L   Small LDL Particle Number 383 <=527 nmol/L   LDL Size 21.4 >20.5 nm   LP-IR Score <25 <=45  Apolipoprotein B   Collection Time: 08/25/23 10:36 AM  Result Value Ref Range   Apolipoprotein B 81 <90 mg/dL  TSH   Collection Time: 08/25/23 10:36 AM  Result Value Ref Range   TSH 1.380 0.450 - 4.500 uIU/mL       Pertinent labs & imaging results that were available during my care of the patient were reviewed by me and considered in my medical decision making.  Assessment & Plan:  [name] [name] was seen today for abdominal pain.  Diagnoses and all orders for this visit:  Generalized abdominal pain -     magnesium citrate SOLN; Take 296 mLs (1 Bottle total) by mouth once for 1 dose.  Chronic idiopathic constipation -     magnesium citrate SOLN; Take 296 mLs (1 Bottle total) by mouth once for 1 dose. -     Ambulatory referral to Gastroenterology     Assessment and Plan    Constipation Intermittent sharp pain in the left lower abdomen, likely due to gas and constipation. Previous x-ray showed significant constipation. Incomplete  relief after initial clean out with Miralax. No nausea or vomiting. Pain likely due to gas bubbles at the large intestine transition. Patient prefers not to continue Miralax due to loose stools. - Administer magnesium citrate for bowel clean out - Continue daily Miralax until regular bowel movements are achieved - Report any changes or lack of improvement  Colonoscopy Follow-up Due for colonoscopy next year due to polyps. Previous provider no longer accepts their insurance. Patient prefers a new gastroenterologist within the [facility] network for insurance compatibility. - Submit referral for a new gastroenterologist within the [facility] network - Inform patient it may take a week or two for the GI office to contact them - Advise patient to inform the office if they do not hear back  within the expected timeframe.          Continue all other maintenance medications.  Follow up plan: Return if symptoms worsen or fail to improve.   Continue healthy lifestyle choices, including diet (rich in fruits, vegetables, and lean proteins, and low in salt and simple carbohydrates) and exercise (at least 30 minutes of moderate physical activity daily).  Educational handout given for constipation   The above assessment and management plan was discussed with the patient. The patient verbalized understanding of and has agreed to the management plan. Patient is aware to call the clinic if they develop any new symptoms or if symptoms persist or worsen. Patient is aware when to return to the clinic for a follow-up visit. Patient educated on when it is appropriate to go to the emergency department.   Kari Baars, FNP-C [facility] [phone]

## 2023-10-10 ENCOUNTER — Ambulatory Visit: Payer: Medicare HMO | Attending: Internal Medicine | Admitting: Internal Medicine

## 2023-10-10 ENCOUNTER — Encounter: Payer: Self-pay | Admitting: Internal Medicine

## 2023-10-10 VITALS — BP 112/74 | HR 69 | Ht 67.0 in | Wt 227.4 lb

## 2023-10-10 DIAGNOSIS — Z95810 Presence of automatic (implantable) cardiac defibrillator: Secondary | ICD-10-CM | POA: Diagnosis not present

## 2023-10-10 DIAGNOSIS — I428 Other cardiomyopathies: Secondary | ICD-10-CM | POA: Diagnosis not present

## 2023-10-10 DIAGNOSIS — I5022 Chronic systolic (congestive) heart failure: Secondary | ICD-10-CM | POA: Diagnosis not present

## 2023-10-10 LAB — CUP PACEART INCLINIC DEVICE CHECK
Date Time Interrogation Session: 20241217164820
HighPow Impedance: 55 Ohm
Implantable Lead Connection Status: 753985
Implantable Lead Connection Status: 753985
Implantable Lead Implant Date: 20090320
Implantable Lead Implant Date: 20090320
Implantable Lead Location: 753859
Implantable Lead Location: 753860
Implantable Lead Model: 158
Implantable Lead Model: 5076
Implantable Lead Serial Number: 182504
Implantable Pulse Generator Implant Date: 20160307
Lead Channel Impedance Value: 505 Ohm
Lead Channel Impedance Value: 514 Ohm
Lead Channel Pacing Threshold Amplitude: 0.4 V
Lead Channel Pacing Threshold Amplitude: 1.1 V
Lead Channel Pacing Threshold Amplitude: 1.2 V
Lead Channel Pacing Threshold Pulse Width: 0.4 ms
Lead Channel Pacing Threshold Pulse Width: 0.4 ms
Lead Channel Pacing Threshold Pulse Width: 0.4 ms
Lead Channel Sensing Intrinsic Amplitude: 0.8 mV
Lead Channel Sensing Intrinsic Amplitude: 24.3 mV
Lead Channel Setting Pacing Amplitude: 2 V
Lead Channel Setting Pacing Amplitude: 2.5 V
Lead Channel Setting Pacing Pulse Width: 0.4 ms
Lead Channel Setting Sensing Sensitivity: 0.6 mV
Pulse Gen Serial Number: 111826

## 2023-10-10 NOTE — Patient Instructions (Signed)

## 2023-10-10 NOTE — Progress Notes (Signed)
[name] [name]     Patient Care Team: [name], DO as PCP - General (Family Medicine) [name], MD as PCP - Cardiology (Cardiology) [name], MD as Consulting Physician (Hematology and Oncology) [name], MD as Consulting Physician (Radiation Oncology) [name], MD as Consulting Physician (General Surgery) [name], MD as Consulting Physician (Cardiology) [name], MD as Consulting Physician (Cardiology)   HPI  [name] [name] is a 62 y.o. female Seen in followup for NICM s/p ICD implantation with generator replacement [employer]  3.16  DATE TEST EF   8/14 TTE 30-40 %   1/15 TTE 35-40 %   10/17 Cath 25-30 Normal CA  1/21 Echo  25-30%   4*/22 Echo 30-35%   6/23 Echo  35-40%    She is intercurrently been diagnosed with breast cancer and underwent lumpectomy but declined adjuvant chemotherapy; she did have radiation therapy.   She had been on Entresto.    Recurrent PE on life long anticoagulation No bleeding   8/23 was diagnosed with atrial fibrillation.  The EMS record was reviewed reviewed today and is more consistent with atrial flutter.  Has had some recurrences of her tachy palpitations.  No significant otherwise shortness of breath or chest pain.  No edema    Date Cr K Mg Hgb  10/17  0.93 4.0      8/21 0.9 4.2  11.4  8/24 0.89 4.3  11.8      Records and Results Reviewed As above   Past Medical History:  Diagnosis Date   AICD (automatic cardioverter/defibrillator) present 09   [employer]- replaced 2016   Arthritis    Bradycardia    Breast cancer (HCC) 02/24/2016   s/p lumpectomy and radiation but refused chemotherapy   Chronic systolic CHF (congestive heart failure) (HCC)    Cough due to angiotensin-converting enzyme inhibitor    Dizziness    Dizziness with standing after squatting, March, 2012   Dyslipidemia    LDL elevation,Patient does not want statin   Family history of breast cancer    GERD  (gastroesophageal reflux disease)    H/O shortness of breath    CPX 10/18:  No evidence of cardiopulmonary limitation. Suspect exercise intolerance related to weight and deconditioning.    Hypothyroidism    ICD (implantable cardiac defibrillator) battery depletion    Dr. Graciela Husbands, June, 2009(artifactual atrial tachycardia response events addressed by reprogramming in parentheses   Leg swelling    right leg   Mitral regurgitation    Nonischemic cardiomyopathy (HCC)    Etiology unknown, diagnosed 2008   Paroxysmal atrial fibrillation (HCC) 06/20/2022   Personal history of radiation therapy 2017   Pneumonia due to COVID-19 virus 10/2019   TIA (transient ischemic attack)    No CT or MRI abnormality, aspirin therapy    Past Surgical History:  Procedure Laterality Date   BREAST BIOPSY Left 02/24/2016   U/S Core   BREAST BIOPSY Left 02/24/2016   U/S Core   BREAST LUMPECTOMY Left 04/05/2016   invasive ductal    BREAST LUMPECTOMY WITH RADIOACTIVE SEED AND SENTINEL LYMPH NODE BIOPSY Left 04/05/2016   Procedure: BREAST LUMPECTOMY WITH RADIOACTIVE SEED AND SENTINEL LYMPH NODE BIOPSY;  Surgeon: [name], MD;  Location: [facility] OR;  Service: General;  Laterality: Left;   CARDIAC CATHETERIZATION N/A 08/05/2016   Procedure: Left Heart Cath and Coronary Angiography;  Surgeon: Peter M Swaziland, MD;  Location: [facility] INVASIVE CV LAB;  Service: Cardiovascular;  Laterality: N/A;  CARDIAC DEFIBRILLATOR PLACEMENT  09   [employer] no remote   COLONOSCOPY     CYST EXCISION Left    back of leg-lt    IMPLANTABLE CARDIOVERTER DEFIBRILLATOR GENERATOR CHANGE N/A 12/29/2014   Procedure: IMPLANTABLE CARDIOVERTER DEFIBRILLATOR GENERATOR CHANGE;  Surgeon: [name], MD;  Location: [facility] CATH LAB;  Service: Cardiovascular;  Laterality: N/A;   LATERAL EPICONDYLE RELEASE  10/11/2012   Procedure: TENNIS ELBOW RELEASE;  Surgeon: Loreta Ave, MD;  Location: [facility];  Service: Orthopedics;   Laterality: Right;  RIGHT ELBOW: TENOTOMY ELBOW LATERAL EPICONDYLITIS TENNIS ELBOW: RADIAL TUNNEL RELEASE   PORT-A-CATH REMOVAL  04/13/2016   Procedure: MINOR REMOVAL PORT-A-CATH;  Surgeon: Manus Rudd, MD;  Location: [facility];  Service: General;;   PORTACATH PLACEMENT Right 04/05/2016   Procedure: INSERTION PORT-A-CATH WITH [place];  Surgeon: [name], MD;  Location: [facility] OR;  Service: General;  Laterality: Right;  right IJ   TOTAL THYROIDECTOMY  2001   TUBAL LIGATION  94   UPPER GI ENDOSCOPY      Current Outpatient Medications  Medication Sig Dispense Refill   acetaminophen (TYLENOL) 500 MG tablet Take 500 mg by mouth every 6 (six) hours as needed for mild pain or moderate pain.     carvedilol (COREG) 3.125 MG tablet Take 1 tablet (3.125 mg total) by mouth 2 (two) times daily. NEEDS FOLLOW UP APPOINTMENT FOR ANYMORE REFILLS 180 tablet 3   chlorpheniramine (CHLOR-TRIMETON) 4 MG tablet Take 4 mg by mouth every 4 (four) hours as needed for allergies.     dicyclomine (BENTYL) 10 MG capsule Take 1 capsule (10 mg total) by mouth 4 (four) times daily as needed for spasms. 120 capsule 1   ELIQUIS 5 MG TABS tablet TAKE 1 TABLET BY MOUTH TWICE DAILY.  NEED  FOLLOW  UP  APPOINTMENT  FOR  MORE  REFILLS. 180 tablet 0   eplerenone (INSPRA) 25 MG tablet TAKE 1 TABLET BY MOUTH ONCE DAILY. 90 tablet 3   famotidine (PEPCID) 20 MG tablet One after supper 30 tablet 11   levothyroxine (SYNTHROID) 75 MCG tablet TAKE 1 TABLET BY MOUTH ONCE DAILY BEFORE BREAKFAST 90 tablet 2   magic mouthwash (lidocaine, diphenhydrAMINE, alum & mag hydroxide) suspension Swish and spit 5 mLs 3 (three) times daily. 360 mL 0   pantoprazole (PROTONIX) 40 MG tablet Take 1 tablet (40 mg total) by mouth daily. Take 30-60 min before first meal of the day 30 tablet 2   polyethylene glycol powder (GLYCOLAX/MIRALAX) 17 GM/SCOOP powder Take 17 g by mouth daily. 3350 g 1   rosuvastatin (CRESTOR) 10 MG tablet Take 1 tablet (10 mg  total) by mouth daily. 90 tablet 3   valsartan (DIOVAN) 80 MG tablet Take 1 tablet (80 mg total) by mouth 2 (two) times daily. 60 tablet 11   No current facility-administered medications for this visit.    Allergies  Allergen Reactions   Avelox [Moxifloxacin Hcl In Nacl] Shortness Of Breath    Shortness of breath   Clindamycin/Lincomycin Shortness Of Breath and Diarrhea    Breathing/diarrhea     Kiwi Extract Anaphylaxis    Makes pt. Feel like her throat is closing    Bactrim [Sulfamethoxazole-Trimethoprim] Swelling   Levofloxacin Other (See Comments)    JOINT PAIN JOINT PAIN   Simvastatin Other (See Comments)    Joint pain   Spironolactone Other (See Comments)    burning   Moxifloxacin Hcl Other (See Comments)   Tramadol Nausea  Only      Review of Systems negative except from HPI and PMH  Physical Exam BP 112/74   Pulse 69   Ht 5\' 7"  (1.702 m)   Wt 227 lb 6.4 oz (103.1 kg)   SpO2 97%   BMI 35.62 kg/m  Well developed and well nourished in no acute distress HENT normal Neck supple   Clear Device pocket well healed; without hematoma or erythema.  There is no tethering  Regular rate and rhythm,  murmur Abd-soft  No Clubbing cyanosis   edema Skin-warm and dry A & Oriented  Grossly normal sensory and motor function  ECG sinus at 69 Intervals 18/09/40 Nonspecific T wave flattening  Device function is normal. Programming changes   See Paceart for details     Assessment and  Plan  NICM  CHF chronic systolic  ICD [employer]     Breast Cancer   Nonsustained VT    Pulmonary embolism-with recurrence---recommendation lifelong anticoagulation  Euvolemic.  Continue her Inspra.  With her GDMT, maintain carvedilol eplerenone and valsartan.  For lifelong anticoagulation the question is what is the proper dose.  The recommendations mostly are 2.5 mg twice daily although her weight may drive her towards 5 twice daily.  Will defer this to Dr. Tenny Craw

## 2023-10-21 ENCOUNTER — Telehealth: Payer: Self-pay | Admitting: Emergency Medicine

## 2023-10-21 ENCOUNTER — Ambulatory Visit: Payer: Self-pay

## 2023-10-21 ENCOUNTER — Ambulatory Visit
Admission: RE | Admit: 2023-10-21 | Discharge: 2023-10-21 | Disposition: A | Discharge status: Home / Self Care | Payer: Medicare HMO | Source: Ambulatory Visit | Attending: Family Medicine

## 2023-10-21 ENCOUNTER — Telehealth: Payer: Self-pay

## 2023-10-21 VITALS — BP 124/81 | HR 79 | Temp 99.1°F | Resp 20

## 2023-10-21 DIAGNOSIS — U071 COVID-19: Secondary | ICD-10-CM

## 2023-10-21 MED ORDER — MOLNUPIRAVIR EUA 200MG CAPSULE: 4.0000 | ORAL_CAPSULE | Freq: Two times a day (BID) | ORAL | 0 refills | Status: AC

## 2023-10-21 MED ORDER — PROMETHAZINE-DM 6.25-15 MG/5ML PO SYRP: 5.0000 mL | ORAL_SOLUTION | Freq: Four times a day (QID) | ORAL | 0 refills | Status: DC | PRN

## 2023-10-21 NOTE — ED Triage Notes (Signed)
Pt reports she tested positive for covid this morning.Sxs of dry throat and hoarse voice, bodyaches and runny nose x 2 days

## 2023-10-21 NOTE — Telephone Encounter (Signed)
Pharmacy called wanting to know if the patient should take the paxlovid due to its possible counteraction with Inspra. After speaking to provider, patient is not a candidate for paxlovid . Options are to either pay out of pocket for Molunpirivir or do not take an antiviral at all due to all of the med counteractions.

## 2023-10-21 NOTE — Telephone Encounter (Signed)
[facility] pharmacy called and states patient's insurance will not cover molnupiravir and asked to change to paxlovid.  RX changed.  Patient instructed on holding cholesterol medication while on paxlovid.

## 2023-10-21 NOTE — ED Provider Notes (Signed)
[facility]    CSN: [id number] Arrival date & time: 10/21/23  0841      History   Chief Complaint Chief Complaint  Patient presents with   Covid Positive    HPI [name] [name] is a 62 y.o. female.   Presenting today with 2-day history of dry scratchy throat, hoarseness, body aches, runny nose, cough.  Denies chest pain, shortness of breath, abdominal pain, nausea vomiting or diarrhea.  Tested positive for COVID this morning.  So far trying over-the-counter cold and congestion medications with mild temporary benefit.    Past Medical History:  Diagnosis Date   AICD (automatic cardioverter/defibrillator) present 09   [employer]- replaced 2016   Arthritis    Bradycardia    Breast cancer (HCC) 02/24/2016   s/p lumpectomy and radiation but refused chemotherapy   Chronic systolic CHF (congestive heart failure) (HCC)    Cough due to angiotensin-converting enzyme inhibitor    Dizziness    Dizziness with standing after squatting, March, 2012   Dyslipidemia    LDL elevation,Patient does not want statin   Family history of breast cancer    GERD (gastroesophageal reflux disease)    H/O shortness of breath    CPX 10/18:  No evidence of cardiopulmonary limitation. Suspect exercise intolerance related to weight and deconditioning.    Hypothyroidism    ICD (implantable cardiac defibrillator) battery depletion    Dr. Graciela Husbands, June, 2009(artifactual atrial tachycardia response events addressed by reprogramming in parentheses   Leg swelling    right leg   Mitral regurgitation    Nonischemic cardiomyopathy (HCC)    Etiology unknown, diagnosed 2008   Paroxysmal atrial fibrillation (HCC) 06/20/2022   Personal history of radiation therapy 2017   Pneumonia due to COVID-19 virus 10/2019   TIA (transient ischemic attack)    No CT or MRI abnormality, aspirin therapy    Patient Active Problem List   Diagnosis Date Noted   Laryngopharyngeal reflux (LPR) 05/31/2023    Tinnitus of both ears 05/31/2023   Deep venous thrombosis of lower extremity (HCC) 05/24/2023   Upper airway cough syndrome 11/28/2022   DOE (dyspnea on exertion) 11/28/2022   Paroxysmal atrial fibrillation (HCC) 06/20/2022   Urge incontinence of urine 05/17/2022   Obesity (BMI 30-39.9) 04/13/2022   Acute deep vein thrombosis (DVT) of right popliteal vein (HCC) 04/13/2022   Acute pulmonary embolism (HCC) 04/12/2022   Neuropathy 05/19/2021   Goiter 05/10/2021   Vestibulitis, vulvar 05/10/2021   Atrophic vaginitis 05/10/2021   Hypertensive disorder 05/10/2021   History of malignant neoplasm of breast 05/10/2021   Osteopenia 04/14/2021   NICM (nonischemic cardiomyopathy) (HCC) 07/23/2020   Pulmonary embolism (HCC) 04/21/2020   Post-surgical hypothyroidism 03/27/2020   Lumbar arthropathy 07/15/2019   Anemia 07/15/2019   History of colonic polyps 05/07/2019   Lumbar spondylosis with myelopathy 04/19/2019   Genetic testing 11/22/2016   Family history of breast cancer    Breast cancer- radiation June 2016 08/05/2016   Abnormal stress test 08/01/2016   Cardiac LV ejection fraction 30-35% 07/18/2016   Heart disease 04/22/2016   Arm numbness 03/14/2016   Facial numbness 03/14/2016   Breast cancer of upper-inner quadrant of left female breast (HCC) 03/09/2016   Chronic combined systolic and diastolic HF (heart failure) (HCC) 02/26/2014   Ejection fraction < 50%    Itching    Dizziness    Bradycardia    GERD (gastroesophageal reflux disease)    Acquired hypothyroidism    Hyperlipidemia    Leg  swelling    Cough due to angiotensin-converting enzyme inhibitor    Dual implantable cardioverter-defibrillator in situ    FOOT PAIN 01/20/2010   History of TIA 2007 01/19/2010    Past Surgical History:  Procedure Laterality Date   BREAST BIOPSY Left 02/24/2016   U/S Core   BREAST BIOPSY Left 02/24/2016   U/S Core   BREAST LUMPECTOMY Left 04/05/2016   invasive ductal    BREAST  LUMPECTOMY WITH RADIOACTIVE SEED AND SENTINEL LYMPH NODE BIOPSY Left 04/05/2016   Procedure: BREAST LUMPECTOMY WITH RADIOACTIVE SEED AND SENTINEL LYMPH NODE BIOPSY;  Surgeon: [name], MD;  Location: [facility] OR;  Service: General;  Laterality: Left;   CARDIAC CATHETERIZATION N/A 08/05/2016   Procedure: Left Heart Cath and Coronary Angiography;  Surgeon: Peter M Swaz[place], MD;  Location: [facility] INVASIVE CV LAB;  Service: Cardiovascular;  Laterality: N/A;   CARDIAC DEFIBRILLATOR PLACEMENT  09   [employer] no remote   COLONOSCOPY     CYST EXCISION Left    back of leg-lt    IMPLANTABLE CARDIOVERTER DEFIBRILLATOR GENERATOR CHANGE N/A 12/29/2014   Procedure: IMPLANTABLE CARDIOVERTER DEFIBRILLATOR GENERATOR CHANGE;  Surgeon: [name], MD;  Location: [facility] CATH LAB;  Service: Cardiovascular;  Laterality: N/A;   LATERAL EPICONDYLE RELEASE  10/11/2012   Procedure: TENNIS ELBOW RELEASE;  Surgeon: Loreta Ave, MD;  Location: [facility];  Service: Orthopedics;  Laterality: Right;  RIGHT ELBOW: TENOTOMY ELBOW LATERAL EPICONDYLITIS TENNIS ELBOW: RADIAL TUNNEL RELEASE   PORT-A-CATH REMOVAL  04/13/2016   Procedure: MINOR REMOVAL PORT-A-CATH;  Surgeon: Manus Rudd, MD;  Location: [facility];  Service: General;;   PORTACATH PLACEMENT Right 04/05/2016   Procedure: INSERTION PORT-A-CATH WITH [place];  Surgeon: [name], MD;  Location: [facility] OR;  Service: General;  Laterality: Right;  right IJ   TOTAL THYROIDECTOMY  2001   TUBAL LIGATION  94   UPPER GI ENDOSCOPY      OB History     Gravida  2   Para      Term      Preterm      AB      Living  2      SAB      IAB      Ectopic      Multiple      Live Births               Home Medications    Prior to Admission medications   Medication Sig Start Date End Date Taking? Authorizing Provider  molnupiravir EUA (LAGEVRIO) 200 mg CAPS capsule Take 4 capsules (800 mg total) by mouth 2 (two) times daily for 5 days.  10/21/23 10/26/23 Yes Particia Nearing, PA-C  promethazine-dextromethorphan (PROMETHAZINE-DM) 6.25-15 MG/5ML syrup Take 5 mLs by mouth 4 (four) times daily as needed. 10/21/23  Yes Particia Nearing, PA-C  acetaminophen (TYLENOL) 500 MG tablet Take 500 mg by mouth every 6 (six) hours as needed for mild pain or moderate pain.    [provider]  carvedilol (COREG) 3.125 MG tablet Take 1 tablet (3.125 mg total) by mouth 2 (two) times daily. NEEDS FOLLOW UP APPOINTMENT FOR ANYMORE REFILLS 04/26/23   Meriam Sprague, MD  chlorpheniramine (CHLOR-TRIMETON) 4 MG tablet Take 4 mg by mouth every 4 (four) hours as needed for allergies.    [provider]  dicyclomine (BENTYL) 10 MG capsule Take 1 capsule (10 mg total) by mouth 4 (four) times daily as needed for spasms.  08/21/23   Gottschalk, Ashly M, DO  ELIQUIS 5 MG TABS tablet TAKE 1 TABLET BY MOUTH TWICE DAILY.  NEED  FOLLOW  UP  APPOINTMENT  FOR  MORE  REFILLS. 07/31/23   Milford, Anderson Malta, FNP  eplerenone (INSPRA) 25 MG tablet TAKE 1 TABLET BY MOUTH ONCE DAILY. 12/07/22   Perlie Gold, PA-C  famotidine (PEPCID) 20 MG tablet One after supper Patient taking differently: 2 (two) times daily. One after supper 03/27/23   Nyoka Cowden, MD  levothyroxine (SYNTHROID) 75 MCG tablet TAKE 1 TABLET BY MOUTH ONCE DAILY BEFORE BREAKFAST 06/27/23   Delynn Flavin M, DO  magic mouthwash (lidocaine, diphenhydrAMINE, alum & mag hydroxide) suspension Swish and spit 5 mLs 3 (three) times daily. 09/13/23   Sonny Masters, FNP  pantoprazole (PROTONIX) 40 MG tablet Take 1 tablet (40 mg total) by mouth daily. Take 30-60 min before first meal of the day 03/27/23   Nyoka Cowden, MD  polyethylene glycol powder (GLYCOLAX/MIRALAX) 17 GM/SCOOP powder Take 17 g by mouth daily. 09/01/23   Sonny Masters, FNP  rosuvastatin (CRESTOR) 10 MG tablet Take 1 tablet (10 mg total) by mouth daily. 08/30/23 11/28/23  [name], MD  valsartan (DIOVAN) 80 MG tablet  Take 1 tablet (80 mg total) by mouth 2 (two) times daily. 03/27/23   Nyoka Cowden, MD  magic mouthwash (nystatin, lidocaine, diphenhydrAMINE, alum & mag hydroxide) suspension Swish and spit 5 mLs 4 (four) times daily as needed for mouth pain. 12/12/22   Valentino Nose, NP    Family History Family History  Problem Relation Age of Onset   Cancer Paternal Grandmother        possible cancer, unknown type   Heart attack Mother    Cancer Mother        breast   Breast cancer Mother 29   Hypertension Brother    Hypertension Sister    Cancer Maternal Aunt        breast   Breast cancer Maternal Aunt    Stroke Neg Hx    Diabetes Neg Hx     Social History Social History   Tobacco Use   Smoking status: Never   Smokeless tobacco: Never  Vaping Use   Vaping status: Never Used  Substance Use Topics   Alcohol use: No   Drug use: No     Allergies   Avelox [moxifloxacin hcl in nacl], Clindamycin/lincomycin, Kiwi extract, Bactrim [sulfamethoxazole-trimethoprim], Levofloxacin, Simvastatin, Spironolactone, Moxifloxacin hcl, and Tramadol   Review of Systems Review of Systems Per HPI  Physical Exam Triage Vital Signs ED Triage Vitals  Encounter Vitals Group     BP 10/21/23 0908 124/81     Systolic BP Percentile --      Diastolic BP Percentile --      Pulse Rate 10/21/23 0908 79     Resp 10/21/23 0908 20     Temp 10/21/23 0908 99.1 F (37.3 C)     Temp Source 10/21/23 0908 Oral     SpO2 10/21/23 0908 95 %     Weight --      Height --      Head Circumference --      Peak Flow --      Pain Score 10/21/23 0906 5     Pain Loc --      Pain Education --      Exclude from Growth Chart --    No data found.  Updated Vital Signs BP 124/81 (  BP Location: Right Arm)   Pulse 79   Temp 99.1 F (37.3 C) (Oral)   Resp 20   SpO2 95%   Visual Acuity Right Eye Distance:   Left Eye Distance:   Bilateral Distance:    Right Eye Near:   Left Eye Near:    Bilateral Near:      Physical Exam Vitals and nursing note reviewed.  Constitutional:      Appearance: Normal appearance.  HENT:     Head: Atraumatic.     Right Ear: Tympanic membrane and external ear normal.     Left Ear: Tympanic membrane and external ear normal.     Nose: Rhinorrhea present.     Mouth/Throat:     Mouth: Mucous membranes are moist.     Pharynx: Posterior oropharyngeal erythema present.  Eyes:     Extraocular Movements: Extraocular movements intact.     Conjunctiva/sclera: Conjunctivae normal.  Cardiovascular:     Rate and Rhythm: Normal rate and regular rhythm.     Heart sounds: Normal heart sounds.  Pulmonary:     Effort: Pulmonary effort is normal.     Breath sounds: Normal breath sounds. No wheezing or rales.  Musculoskeletal:        General: Normal range of motion.     Cervical back: Normal range of motion and neck supple.  Skin:    General: Skin is warm and dry.  Neurological:     Mental Status: She is alert and oriented to person, place, and time.  Psychiatric:        Mood and Affect: Mood normal.        Thought Content: Thought content normal.      UC Treatments / Results  Labs (all labs ordered are listed, but only abnormal results are displayed) Labs Reviewed - No data to display  EKG   Radiology No results found.  Procedures Procedures (including critical care time)  Medications Ordered in UC Medications - No data to display  Initial Impression / Assessment and Plan / UC Course  I have reviewed the triage vital signs and the nursing notes.  Pertinent labs & imaging results that were available during my care of the patient were reviewed by me and considered in my medical decision making (see chart for details).     Treat with molnupiravir, Phenergan DM, supportive over-the-counter medications and home care.  Return for worsening symptoms.  Final Clinical Impressions(s) / UC Diagnoses   Final diagnoses:  COVID-19   Discharge Instructions    None    ED Prescriptions     Medication Sig Dispense Auth. Provider   molnupiravir EUA (LAGEVRIO) 200 mg CAPS capsule Take 4 capsules (800 mg total) by mouth 2 (two) times daily for 5 days. 40 capsule Particia Nearing, [place]   promethazine-dextromethorphan (PROMETHAZINE-DM) 6.25-15 MG/5ML syrup Take 5 mLs by mouth 4 (four) times daily as needed. 100 mL Particia Nearing, [place]      PDMP not reviewed this encounter.   Particia Nearing, [place] 10/21/23 1015

## 2023-10-22 ENCOUNTER — Encounter (INDEPENDENT_AMBULATORY_CARE_PROVIDER_SITE_OTHER): Payer: Medicare HMO | Admitting: Family Medicine

## 2023-10-22 DIAGNOSIS — U071 COVID-19: Secondary | ICD-10-CM | POA: Diagnosis not present

## 2023-10-23 ENCOUNTER — Ambulatory Visit (HOSPITAL_COMMUNITY)
Admission: RE | Admit: 2023-10-23 | Discharge: 2023-10-23 | Disposition: A | Discharge status: Home / Self Care | Payer: Medicare HMO | Source: Ambulatory Visit | Attending: Family Medicine | Admitting: Family Medicine

## 2023-10-23 DIAGNOSIS — R058 Other specified cough: Secondary | ICD-10-CM | POA: Diagnosis not present

## 2023-10-23 DIAGNOSIS — U071 COVID-19: Secondary | ICD-10-CM | POA: Insufficient documentation

## 2023-10-23 NOTE — Telephone Encounter (Signed)
Please see the MyChart message reply(ies) for my assessment and plan.    This patient gave consent for this Medical Advice Message and is aware that it may result in a bill to [employer], as well as the possibility of receiving a bill for a co-payment or deductible. They are an established patient, but are not seeking medical advice exclusively about a problem treated during an in person or video visit in the last seven days. I did not recommend an in person or video visit within seven days of my reply.    I spent a total of 6 minutes cumulative time within 7 days through [employer].  Delynn Flavin, DO

## 2023-10-31 ENCOUNTER — Ambulatory Visit: Payer: Self-pay | Admitting: Gastroenterology

## 2023-11-01 ENCOUNTER — Ambulatory Visit (INDEPENDENT_AMBULATORY_CARE_PROVIDER_SITE_OTHER): Payer: Medicare HMO | Admitting: Family Medicine

## 2023-11-01 ENCOUNTER — Ambulatory Visit: Payer: Medicare HMO | Admitting: Gastroenterology

## 2023-11-01 ENCOUNTER — Encounter: Payer: Self-pay | Admitting: Family Medicine

## 2023-11-01 ENCOUNTER — Encounter: Payer: Self-pay | Admitting: Internal Medicine

## 2023-11-01 ENCOUNTER — Other Ambulatory Visit (HOSPITAL_COMMUNITY): Payer: Self-pay | Admitting: Family Medicine

## 2023-11-01 VITALS — BP 130/82 | HR 75 | Temp 96.6°F | Ht 67.0 in | Wt 226.6 lb

## 2023-11-01 DIAGNOSIS — U071 COVID-19: Secondary | ICD-10-CM | POA: Diagnosis not present

## 2023-11-01 DIAGNOSIS — R0981 Nasal congestion: Secondary | ICD-10-CM

## 2023-11-01 MED ORDER — FLUTICASONE PROPIONATE 50 MCG/ACT NA SUSP
2.0000 | Freq: Every day | NASAL | 6 refills | Status: AC
Start: 2023-11-01 — End: ?

## 2023-11-01 MED ORDER — GUAIFENESIN ER 600 MG PO TB12
600.0000 mg | ORAL_TABLET | Freq: Two times a day (BID) | ORAL | 0 refills | Status: AC
Start: 2023-11-01 — End: 2023-11-11

## 2023-11-01 NOTE — Progress Notes (Addendum)
 Subjective:  Patient ID: [name] [name], female    DOB: 09-14-1961, 63 y.o.   MRN: [id number]  Patient Care Team: Jolinda Norene CHRISTELLA, DO as PCP - General (Family Medicine) Okey Vina GAILS, MD as PCP - Cardiology (Cardiology) Odean Potts, MD as Consulting Physician (Hematology and Oncology) Dewey Rush, MD as Consulting Physician (Radiation Oncology) Ethyl Lenis, MD as Consulting Physician (General Surgery) Okey Vina GAILS, MD as Consulting Physician (Cardiology) Fernande Elspeth BROCKS, MD as Consulting Physician (Cardiology)   Chief Complaint:  Nasal Congestion and Headache (Tested positive for covid on 12/28 )   HPI: [name] [name] is a 63 y.o. female presenting on 11/01/2023 for Nasal Congestion and Headache (Tested positive for covid on 12/28 )   Discussed the use of AI scribe software for clinical note transcription with the patient, who gave verbal consent to proceed.  History of Present Illness   The patient, with a history of COVID-19, presents with persistent nasal congestion and postnasal drip. She initially tested positive for COVID-19 on December 28th and negative on January 1st. Despite symptomatic care, including Mucinex  and cough syrup, her symptoms have not improved. She describes a sensation of congestion building up in her nose, causing discomfort in her forehead. She denies fever. She has not been using any nasal sprays. A recent chest x-ray was clear.        Relevant past medical, surgical, family, and social history reviewed and updated as indicated.  Allergies and medications reviewed and updated. Data reviewed: Chart in Epic.   Past Medical History:  Diagnosis Date   AICD (automatic cardioverter/defibrillator) present 09   [employer]- replaced 2016   Arthritis    Bradycardia    Breast cancer (HCC) 02/24/2016   s/p lumpectomy and radiation but refused chemotherapy   Chronic systolic CHF (congestive heart failure) (HCC)    Cough due to angiotensin-converting  enzyme inhibitor    Dizziness    Dizziness with standing after squatting, March, 2012   Dyslipidemia    LDL elevation,Patient does not want statin   Family history of breast cancer    GERD (gastroesophageal reflux disease)    H/O shortness of breath    CPX 10/18:  No evidence of cardiopulmonary limitation. Suspect exercise intolerance related to weight and deconditioning.    Hypothyroidism    ICD (implantable cardiac defibrillator) battery depletion    Dr. Fernande, June, 2009(artifactual atrial tachycardia response events addressed by reprogramming in parentheses   Leg swelling    right leg   Mitral regurgitation    Nonischemic cardiomyopathy (HCC)    Etiology unknown, diagnosed 2008   Paroxysmal atrial fibrillation (HCC) 06/20/2022   Personal history of radiation therapy 2017   Pneumonia due to COVID-19 virus 10/2019   TIA (transient ischemic attack)    No CT or MRI abnormality, aspirin  therapy    Past Surgical History:  Procedure Laterality Date   BREAST BIOPSY Left 02/24/2016   U/S Core   BREAST BIOPSY Left 02/24/2016   U/S Core   BREAST LUMPECTOMY Left 04/05/2016   invasive ductal    BREAST LUMPECTOMY WITH RADIOACTIVE SEED AND SENTINEL LYMPH NODE BIOPSY Left 04/05/2016   Procedure: BREAST LUMPECTOMY WITH RADIOACTIVE SEED AND SENTINEL LYMPH NODE BIOPSY;  Surgeon: Lenis Ethyl, MD;  Location: [facility] OR;  Service: General;  Laterality: Left;   CARDIAC CATHETERIZATION N/A 08/05/2016   Procedure: Left Heart Cath and Coronary Angiography;  Surgeon: Peter M Jordan, MD;  Location: [facility] INVASIVE CV LAB;  Service: Cardiovascular;  Laterality: N/A;   CARDIAC DEFIBRILLATOR PLACEMENT  09   [employer] no remote   COLONOSCOPY     CYST EXCISION Left    back of leg-lt    IMPLANTABLE CARDIOVERTER DEFIBRILLATOR GENERATOR CHANGE N/A 12/29/2014   Procedure: IMPLANTABLE CARDIOVERTER DEFIBRILLATOR GENERATOR CHANGE;  Surgeon: Elspeth JAYSON Sage, MD;  Location: [facility] CATH LAB;  Service: Cardiovascular;   Laterality: N/A;   LATERAL EPICONDYLE RELEASE  10/11/2012   Procedure: TENNIS ELBOW RELEASE;  Surgeon: Toribio JULIANNA Chancy, MD;  Location: [facility];  Service: Orthopedics;  Laterality: Right;  RIGHT ELBOW: TENOTOMY ELBOW LATERAL EPICONDYLITIS TENNIS ELBOW: RADIAL TUNNEL RELEASE   PORT-A-CATH REMOVAL  04/13/2016   Procedure: MINOR REMOVAL PORT-A-CATH;  Surgeon: Donnice Lima, MD;  Location: [facility];  Service: General;;   PORTACATH PLACEMENT Right 04/05/2016   Procedure: INSERTION PORT-A-CATH WITH US ;  Surgeon: Alm Angle, MD;  Location: [facility] OR;  Service: General;  Laterality: Right;  right IJ   TOTAL THYROIDECTOMY  2001   TUBAL LIGATION  94   UPPER GI ENDOSCOPY      Social History   Socioeconomic History   Marital status: Widowed    Spouse name: Not on file   Number of children: Not on file   Years of education: Not on file   Highest education level: Some college, no degree  Occupational History   Occupation: Widow  Tobacco Use   Smoking status: Never   Smokeless tobacco: Never  Vaping Use   Vaping status: Never Used  Substance and Sexual Activity   Alcohol use: No   Drug use: No   Sexual activity: Not Currently    Birth control/protection: Surgical    Comment: tubal  Other Topics Concern   Not on file  Social History Narrative   Not on file   Social Drivers of Health   Financial Resource Strain: Medium Risk (10/06/2023)   Overall Financial Resource Strain (CARDIA)    Difficulty of Paying Living Expenses: Somewhat hard  Food Insecurity: Food Insecurity Present (10/06/2023)   Hunger Vital Sign    Worried About Running Out of Food in the Last Year: Sometimes true    Ran Out of Food in the Last Year: Never true  Transportation Needs: No Transportation Needs (10/06/2023)   PRAPARE - Administrator, Civil Service (Medical): No    Lack of Transportation (Non-Medical): No  Physical Activity: Unknown (10/06/2023)   Exercise Vital  Sign    Days of Exercise per Week: 0 days    Minutes of Exercise per Session: Not on file  Stress: No Stress Concern Present (10/06/2023)   [employer] of Occupational Health - Occupational Stress Questionnaire    Feeling of Stress : Only a little  Social Connections: Moderately Integrated (10/06/2023)   Social Connection and Isolation Panel [NHANES]    Frequency of Communication with Friends and Family: More than three times a week    Frequency of Social Gatherings with Friends and Family: Twice a week    Attends Religious Services: More than 4 times per year    Active Member of [employer] or Organizations: Yes    Attends Banker Meetings: More than 4 times per year    Marital Status: Widowed  Intimate Partner Violence: Unknown (01/25/2022)   Received from [facility]   HITS    Physically Hurt: Not on file    Insult or Talk Down To: Not on file    Threaten Physical Harm: Not  on file    Scream or Curse: Not on file    Outpatient Encounter Medications as of 11/01/2023  Medication Sig   acetaminophen  (TYLENOL ) 500 MG tablet Take 500 mg by mouth every 6 (six) hours as needed for mild pain or moderate pain.   carvedilol  (COREG ) 3.125 MG tablet Take 1 tablet (3.125 mg total) by mouth 2 (two) times daily. NEEDS FOLLOW UP APPOINTMENT FOR ANYMORE REFILLS   chlorpheniramine  (CHLOR-TRIMETON ) 4 MG tablet Take 4 mg by mouth every 4 (four) hours as needed for allergies.   dicyclomine  (BENTYL ) 10 MG capsule Take 1 capsule (10 mg total) by mouth 4 (four) times daily as needed for spasms.   eplerenone  (INSPRA ) 25 MG tablet TAKE 1 TABLET BY MOUTH ONCE DAILY.   famotidine  (PEPCID ) 20 MG tablet One after supper (Patient taking differently: 2 (two) times daily. One after supper)   fluticasone  (FLONASE ) 50 MCG/ACT nasal spray Place 2 sprays into both nostrils daily.   guaiFENesin  (MUCINEX ) 600 MG 12 hr tablet Take 1 tablet (600 mg total) by mouth 2 (two) times daily for 10 days.    levothyroxine  (SYNTHROID ) 75 MCG tablet TAKE 1 TABLET BY MOUTH ONCE DAILY BEFORE BREAKFAST   polyethylene glycol powder (GLYCOLAX /MIRALAX ) 17 GM/SCOOP powder Take 17 g by mouth daily.   promethazine -dextromethorphan (PROMETHAZINE -DM) 6.25-15 MG/5ML syrup Take 5 mLs by mouth 4 (four) times daily as needed.   rosuvastatin  (CRESTOR ) 10 MG tablet Take 1 tablet (10 mg total) by mouth daily.   valsartan  (DIOVAN ) 80 MG tablet Take 1 tablet (80 mg total) by mouth 2 (two) times daily.   [DISCONTINUED] ELIQUIS  5 MG TABS tablet TAKE 1 TABLET BY MOUTH TWICE DAILY.  NEED  FOLLOW  UP  APPOINTMENT  FOR  MORE  REFILLS.   pantoprazole  (PROTONIX ) 40 MG tablet Take 1 tablet (40 mg total) by mouth daily. Take 30-60 min before first meal of the day (Patient not taking: Reported on 11/01/2023)   [DISCONTINUED] magic mouthwash (lidocaine , diphenhydrAMINE, alum & mag hydroxide) suspension Swish and spit 5 mLs 3 (three) times daily.   [DISCONTINUED] magic mouthwash (nystatin , lidocaine , diphenhydrAMINE, alum & mag hydroxide) suspension Swish and spit 5 mLs 4 (four) times daily as needed for mouth pain.   No facility-administered encounter medications on file as of 11/01/2023.    Allergies  Allergen Reactions   Avelox [Moxifloxacin Hcl In Nacl] Shortness Of Breath    Shortness of breath   Clindamycin/Lincomycin Shortness Of Breath and Diarrhea    Breathing/diarrhea     Kiwi Extract Anaphylaxis    Makes pt. Feel like her throat is closing    Bactrim [Sulfamethoxazole-Trimethoprim] Swelling   Levofloxacin Other (See Comments)    JOINT PAIN JOINT PAIN   Simvastatin Other (See Comments)    Joint pain   Spironolactone  Other (See Comments)    burning   Moxifloxacin Hcl Other (See Comments)   Tramadol  Nausea Only    Pertinent ROS per HPI, otherwise unremarkable      Objective:  BP 130/82   Pulse 75   Temp (!) 96.6 F (35.9 C)   Ht 5' 7 (1.702 m)   Wt 226 lb 9.6 oz (102.8 kg)   SpO2 98%   BMI 35.49  kg/m    Wt Readings from Last 3 Encounters:  11/01/23 226 lb 9.6 oz (102.8 kg)  10/10/23 227 lb 6.4 oz (103.1 kg)  10/06/23 225 lb 6.4 oz (102.2 kg)    Physical Exam Vitals and nursing note reviewed.  Constitutional:  General: She is not in acute distress.    Appearance: Normal appearance. She is obese. She is not ill-appearing, toxic-appearing or diaphoretic.  HENT:     Head: Normocephalic and atraumatic.     Right Ear: Tympanic membrane, ear canal and external ear normal.     Left Ear: Tympanic membrane, ear canal and external ear normal.     Nose: Congestion and rhinorrhea present.     Mouth/Throat:     Mouth: Mucous membranes are moist.     Pharynx: Oropharynx is clear. Posterior oropharyngeal erythema present. No oropharyngeal exudate.  Eyes:     Conjunctiva/sclera: Conjunctivae normal.     Pupils: Pupils are equal, round, and reactive to light.  Cardiovascular:     Rate and Rhythm: Normal rate and regular rhythm.     Heart sounds: Normal heart sounds.  Pulmonary:     Effort: Pulmonary effort is normal.     Breath sounds: Normal breath sounds.  Musculoskeletal:     Cervical back: Neck supple.     Right lower leg: No edema.     Left lower leg: No edema.  Skin:    General: Skin is warm and dry.     Capillary Refill: Capillary refill takes less than 2 seconds.  Neurological:     General: No focal deficit present.     Mental Status: She is alert and oriented to person, place, and time.  Psychiatric:        Mood and Affect: Mood normal.        Behavior: Behavior normal.        Thought Content: Thought content normal.        Judgment: Judgment normal.     Results for orders placed or performed in visit on 10/10/23  CUP PACEART [facility] DEVICE CHECK   Collection Time: 10/10/23  4:48 PM  Result Value Ref Range   Date Time Interrogation Session [id number]    Pulse Generator Manufacturer BOST    Pulse Gen Model E143 [facility] DR    Pulse Gen Serial Number  [id number]    Clinic Name [facility]    Implantable Pulse Generator Type Implantable Cardiac Defibulator    Implantable Pulse Generator Implant Date 79839692    Implantable Lead Manufacturer GUIC    Implantable Lead Model 0158 [employer]iance    Implantable Lead Serial Number [id number]    Implantable Lead Implant Date 79909679    Implantable Lead Location Detail 1 APEX    Implantable Lead Location [id number]    Implantable Lead Connection Status [id number]    Implantable Lead Manufacturer MERM    Implantable Lead Model 5076 CapSureFix Novus    Implantable Lead Serial Number [id number]    Implantable Lead Implant Date 79909679    Implantable Lead Location Detail 1 APPENDAGE    Implantable Lead Location [id number]    Implantable Lead Connection Status [id number]    Lead Channel Setting Sensing Sensitivity 0.6 mV   Lead Channel Setting Sensing Adaptation Mode Adaptive Sensing    Lead Channel Setting Pacing Amplitude 2.0 V   Lead Channel Setting Pacing Pulse Width 0.4 ms   Lead Channel Setting Pacing Amplitude 2.5 V   Zone Setting Status Active    Zone Setting Status Active    Zone Setting Status Active    Lead Channel Impedance Value 505.0 ohm   Lead Channel Sensing Intrinsic Amplitude 0.8 mV   Lead Channel Pacing Threshold Amplitude 0.4000 V   Lead Channel Pacing Threshold Pulse Width 0.4 ms  Lead Channel Impedance Value 514.0 ohm   Lead Channel Sensing Intrinsic Amplitude 24.3 mV   Lead Channel Pacing Threshold Amplitude 1.1000 V   Lead Channel Pacing Threshold Pulse Width 0.4 ms   Lead Channel Pacing Threshold Amplitude 1.2 V   Lead Channel Pacing Threshold Pulse Width 0.4 ms   HighPow Impedance 55.0 ohm   Battery Status BOS    Eval Rhythm AS/VS 74        Pertinent labs & imaging results that were available during my care of the patient were reviewed by me and considered in my medical decision making.  Assessment & Plan:  [name] [name] was seen today for nasal congestion and  headache.  Diagnoses and all orders for this visit:  COVID-19 -     guaiFENesin  (MUCINEX ) 600 MG 12 hr tablet; Take 1 tablet (600 mg total) by mouth 2 (two) times daily for 10 days. -     fluticasone  (FLONASE ) 50 MCG/ACT nasal spray; Place 2 sprays into both nostrils daily.  Nasal congestion -     guaiFENesin  (MUCINEX ) 600 MG 12 hr tablet; Take 1 tablet (600 mg total) by mouth 2 (two) times daily for 10 days. -     fluticasone  (FLONASE ) 50 MCG/ACT nasal spray; Place 2 sprays into both nostrils daily.     Assessment and Plan    Post-COVID-19 Syndrome Tested positive for COVID-19 on October 21, 2023. Continues to experience nasal congestion, sinus pain, and pressure, particularly in the forehead. No fever reported. Chest x-ray clear, lungs clear on auscultation. Symptoms consistent with post-viral syndrome rather than long COVID. Previously used Mucinex  and cough syrup. Discussed that Flonase  will help decrease nasal congestion and sinus pressure, and Mucinex  will help clear mucus. Emphasized the importance of rest and increased fluid intake. Informed that secondary pneumonia is a concern but chest x-ray and lung sounds are clear. Explained that post-viral syndrome can take several weeks to resolve. - Prescribe Mucinex  for another two weeks - Prescribe Flonase  for nasal congestion and sinus pressure - Advise rest and increased fluid intake - Instruct to monitor symptoms and report any fever or worsening condition.          Continue all other maintenance medications.  Follow up plan: Return if symptoms worsen or fail to improve.   Continue healthy lifestyle choices, including diet (rich in fruits, vegetables, and lean proteins, and low in salt and simple carbohydrates) and exercise (at least 30 minutes of moderate physical activity daily).  Educational handout given for COVID-19  The above assessment and management plan was discussed with the patient. The patient verbalized  understanding of and has agreed to the management plan. Patient is aware to call the clinic if they develop any new symptoms or if symptoms persist or worsen. Patient is aware when to return to the clinic for a follow-up visit. Patient educated on when it is appropriate to go to the emergency department.   [name] Bruns, FNP-C [facility] [phone]

## 2023-11-01 NOTE — Telephone Encounter (Signed)
 See about coverage for Eliquis   She needs to stay on 5 mg bid

## 2023-11-02 ENCOUNTER — Other Ambulatory Visit: Payer: Self-pay

## 2023-11-02 ENCOUNTER — Encounter: Payer: Self-pay | Admitting: Family Medicine

## 2023-11-02 ENCOUNTER — Telehealth: Payer: Self-pay | Admitting: Family Medicine

## 2023-11-02 DIAGNOSIS — E7849 Other hyperlipidemia: Secondary | ICD-10-CM

## 2023-11-02 NOTE — Telephone Encounter (Signed)
 Copied from CRM [id number]. Topic: General - Other >> Nov 01, 2023  4:37 PM Elle L wrote: Reason for CRM: The patient is requesting to have her after visit summary from her appointment on 11/01/2023 corrected. She states that she tested positive for COVID on 12/28 and then retested on 01/03 but was negative. However, the notes show positive for each dates.

## 2023-11-09 ENCOUNTER — Encounter: Payer: Self-pay | Admitting: Gastroenterology

## 2023-11-09 ENCOUNTER — Ambulatory Visit (INDEPENDENT_AMBULATORY_CARE_PROVIDER_SITE_OTHER): Payer: Medicare HMO

## 2023-11-09 DIAGNOSIS — I428 Other cardiomyopathies: Secondary | ICD-10-CM | POA: Diagnosis not present

## 2023-11-09 LAB — CUP PACEART REMOTE DEVICE CHECK
Battery Remaining Longevity: 42 mo
Battery Remaining Percentage: 52 %
Brady Statistic RA Percent Paced: 0 %
Brady Statistic RV Percent Paced: 0 %
Date Time Interrogation Session: 20250116000100
HighPow Impedance: 57 Ohm
Implantable Lead Connection Status: 753985
Implantable Lead Connection Status: 753985
Implantable Lead Implant Date: 20090320
Implantable Lead Implant Date: 20090320
Implantable Lead Location: 753859
Implantable Lead Location: 753860
Implantable Lead Model: 158
Implantable Lead Model: 5076
Implantable Lead Serial Number: 182504
Implantable Pulse Generator Implant Date: 20160307
Lead Channel Impedance Value: 503 Ohm
Lead Channel Impedance Value: 511 Ohm
Lead Channel Pacing Threshold Amplitude: 0.4 V
Lead Channel Pacing Threshold Amplitude: 1.1 V
Lead Channel Pacing Threshold Pulse Width: 0.4 ms
Lead Channel Pacing Threshold Pulse Width: 0.4 ms
Lead Channel Setting Pacing Amplitude: 2 V
Lead Channel Setting Pacing Amplitude: 2.5 V
Lead Channel Setting Pacing Pulse Width: 0.4 ms
Lead Channel Setting Sensing Sensitivity: 0.6 mV
Pulse Gen Serial Number: 111826

## 2023-11-09 LAB — NMR, LIPOPROFILE
Cholesterol, Total: 169 mg/dL (ref 100–199)
HDL Particle Number: 38.2 umol/L (ref >=30.5)
HDL-C: 69 mg/dL (ref >39)
LDL Particle Number: 1037 nmol/L — ABNORMAL HIGH (ref <1000)
LDL Size: 21 nmol (ref >20.5)
LDL-C (NIH Calc): 88 mg/dL (ref 0–99)
LP-IR Score: <25 (ref <=45)
Small LDL Particle Number: 319 nmol/L (ref <=527)
Triglycerides: 60 mg/dL (ref 0–149)

## 2023-11-12 ENCOUNTER — Encounter: Payer: Self-pay | Admitting: Internal Medicine

## 2023-11-16 DIAGNOSIS — M25562 Pain in left knee: Secondary | ICD-10-CM | POA: Diagnosis not present

## 2023-11-16 DIAGNOSIS — M461 Sacroiliitis, not elsewhere classified: Secondary | ICD-10-CM | POA: Diagnosis not present

## 2023-11-16 DIAGNOSIS — M79641 Pain in right hand: Secondary | ICD-10-CM | POA: Diagnosis not present

## 2023-11-16 DIAGNOSIS — M4716 Other spondylosis with myelopathy, lumbar region: Secondary | ICD-10-CM | POA: Diagnosis not present

## 2023-11-16 DIAGNOSIS — M25561 Pain in right knee: Secondary | ICD-10-CM | POA: Diagnosis not present

## 2023-11-26 NOTE — Progress Notes (Unsigned)
GI Office Note    Referring Provider: [name], DO Primary Care Physician:  [name], DO  Primary Gastroenterologist:  Chief Complaint   No chief complaint on file.    History of Present Illness   [name] [name] is a 63 y.o. female presenting today at the request of Sonny Masters, FNP for IBS-C, colonoscopy.    H/o afib, aicd, chf, PE  Colonoscopy 04/2019: Findings:  One 5 mm sessile polyp in the ascending colon; removed by cold snare, fragments of sessile serrated lesions All observed locations appeared normal, including the cecum, transverse  colon, descending colon, rectosigmoid and rectum.  Small and internal hemorrhoids  All observed locations appeared normal, including the terminal ileum.   EGD 04/2018 Report not available, path as below -small bowel bx normal -gastric polyp bx with mild chronic gastritis, no h.pylori  Medications   Current Outpatient Medications  Medication Sig Dispense Refill   acetaminophen (TYLENOL) 500 MG tablet Take 500 mg by mouth every 6 (six) hours as needed for mild pain or moderate pain.     apixaban (ELIQUIS) 5 MG TABS tablet TAKE 1 TABLET BY MOUTH TWICE DAILY . APPOINTMENT REQUIRED FOR FUTURE REFILLS 60 tablet 0   carvedilol (COREG) 3.125 MG tablet Take 1 tablet (3.125 mg total) by mouth 2 (two) times daily. NEEDS FOLLOW UP APPOINTMENT FOR ANYMORE REFILLS 180 tablet 3   chlorpheniramine (CHLOR-TRIMETON) 4 MG tablet Take 4 mg by mouth every 4 (four) hours as needed for allergies.     dicyclomine (BENTYL) 10 MG capsule Take 1 capsule (10 mg total) by mouth 4 (four) times daily as needed for spasms. 120 capsule 1   eplerenone (INSPRA) 25 MG tablet TAKE 1 TABLET BY MOUTH ONCE DAILY. 90 tablet 3   famotidine (PEPCID) 20 MG tablet One after supper (Patient taking differently: 2 (two) times daily. One after supper) 30 tablet 11   fluticasone (FLONASE) 50 MCG/ACT nasal spray Place 2 sprays into both nostrils daily. 16 g 6    levothyroxine (SYNTHROID) 75 MCG tablet TAKE 1 TABLET BY MOUTH ONCE DAILY BEFORE BREAKFAST 90 tablet 2   pantoprazole (PROTONIX) 40 MG tablet Take 1 tablet (40 mg total) by mouth daily. Take 30-60 min before first meal of the day (Patient not taking: Reported on 11/01/2023) 30 tablet 2   polyethylene glycol powder (GLYCOLAX/MIRALAX) 17 GM/SCOOP powder Take 17 g by mouth daily. 3350 g 1   promethazine-dextromethorphan (PROMETHAZINE-DM) 6.25-15 MG/5ML syrup Take 5 mLs by mouth 4 (four) times daily as needed. 100 mL 0   rosuvastatin (CRESTOR) 10 MG tablet Take 1 tablet (10 mg total) by mouth daily. 90 tablet 3   valsartan (DIOVAN) 80 MG tablet Take 1 tablet (80 mg total) by mouth 2 (two) times daily. 60 tablet 11   No current facility-administered medications for this visit.    Allergies   Allergies as of 11/27/2023 - Review Complete 11/01/2023  Allergen Reaction Noted   Avelox [moxifloxacin hcl in nacl] Shortness Of Breath 09/07/2012   Clindamycin/lincomycin Shortness Of Breath and Diarrhea 12/09/2014   Kiwi extract Anaphylaxis 09/07/2012   Bactrim [sulfamethoxazole-trimethoprim] Swelling 11/06/2014   Levofloxacin Other (See Comments) 10/21/2015   Simvastatin Other (See Comments) 07/10/2019   Spironolactone Other (See Comments) 10/21/2015   Moxifloxacin hcl Other (See Comments) 07/19/2022   Tramadol Nausea Only 05/17/2022    Past Medical History   Past Medical History:  Diagnosis Date   AICD (automatic cardioverter/defibrillator) present 09   [employer]  [employer]- replaced 2016   Arthritis    Bradycardia    Breast cancer (HCC) 02/24/2016   s/p lumpectomy and radiation but refused chemotherapy   Chronic systolic CHF (congestive heart failure) (HCC)    Cough due to angiotensin-converting enzyme inhibitor    Dizziness    Dizziness with standing after squatting, March, 2012   Dyslipidemia    LDL elevation,Patient does not want statin   Family history of breast cancer    GERD  (gastroesophageal reflux disease)    H/O shortness of breath    CPX 10/18:  No evidence of cardiopulmonary limitation. Suspect exercise intolerance related to weight and deconditioning.    Hypothyroidism    ICD (implantable cardiac defibrillator) battery depletion    Dr. Graciela Husbands, June, 2009(artifactual atrial tachycardia response events addressed by reprogramming in parentheses   Leg swelling    right leg   Mitral regurgitation    Nonischemic cardiomyopathy (HCC)    Etiology unknown, diagnosed 2008   Paroxysmal atrial fibrillation (HCC) 06/20/2022   Personal history of radiation therapy 2017   Pneumonia due to COVID-19 virus 10/2019   TIA (transient ischemic attack)    No CT or MRI abnormality, aspirin therapy    Past Surgical History   Past Surgical History:  Procedure Laterality Date   BREAST BIOPSY Left 02/24/2016   U/S Core   BREAST BIOPSY Left 02/24/2016   U/S Core   BREAST LUMPECTOMY Left 04/05/2016   invasive ductal    BREAST LUMPECTOMY WITH RADIOACTIVE SEED AND SENTINEL LYMPH NODE BIOPSY Left 04/05/2016   Procedure: BREAST LUMPECTOMY WITH RADIOACTIVE SEED AND SENTINEL LYMPH NODE BIOPSY;  Surgeon: [name], MD;  Location: [facility] OR;  Service: General;  Laterality: Left;   CARDIAC CATHETERIZATION N/A 08/05/2016   Procedure: Left Heart Cath and Coronary Angiography;  Surgeon: Peter M Swaziland, MD;  Location: [facility] INVASIVE CV LAB;  Service: Cardiovascular;  Laterality: N/A;   CARDIAC DEFIBRILLATOR PLACEMENT  09   [employer] no remote   COLONOSCOPY     CYST EXCISION Left    back of leg-lt    IMPLANTABLE CARDIOVERTER DEFIBRILLATOR GENERATOR CHANGE N/A 12/29/2014   Procedure: IMPLANTABLE CARDIOVERTER DEFIBRILLATOR GENERATOR CHANGE;  Surgeon: [name], MD;  Location: [facility] CATH LAB;  Service: Cardiovascular;  Laterality: N/A;   LATERAL EPICONDYLE RELEASE  10/11/2012   Procedure: TENNIS ELBOW RELEASE;  Surgeon: Loreta Ave, MD;  Location: [facility];   Service: Orthopedics;  Laterality: Right;  RIGHT ELBOW: TENOTOMY ELBOW LATERAL EPICONDYLITIS TENNIS ELBOW: RADIAL TUNNEL RELEASE   PORT-A-CATH REMOVAL  04/13/2016   Procedure: MINOR REMOVAL PORT-A-CATH;  Surgeon: Manus Rudd, MD;  Location: [facility];  Service: General;;   PORTACATH PLACEMENT Right 04/05/2016   Procedure: INSERTION PORT-A-CATH WITH [place];  Surgeon: [name], MD;  Location: [facility] OR;  Service: General;  Laterality: Right;  right IJ   TOTAL THYROIDECTOMY  2001   TUBAL LIGATION  94   UPPER GI ENDOSCOPY      Past Family History   Family History  Problem Relation Age of Onset   Cancer Paternal Grandmother        possible cancer, unknown type   Heart attack Mother    Cancer Mother        breast   Breast cancer Mother 53   Hypertension Brother    Hypertension Sister    Cancer Maternal Aunt        breast   Breast cancer Maternal Aunt    Stroke Neg  Hx    Diabetes Neg Hx     Past Social History   Social History   Socioeconomic History   Marital status: Widowed    Spouse name: Not on file   Number of children: Not on file   Years of education: Not on file   Highest education level: Some college, no degree  Occupational History   Occupation: Widow  Tobacco Use   Smoking status: Never   Smokeless tobacco: Never  Vaping Use   Vaping status: Never Used  Substance and Sexual Activity   Alcohol use: No   Drug use: No   Sexual activity: Not Currently    Birth control/protection: Surgical    Comment: tubal  Other Topics Concern   Not on file  Social History Narrative   Not on file   Social Drivers of Health   Financial Resource Strain: Medium Risk (10/06/2023)   Overall Financial Resource Strain (CARDIA)    Difficulty of Paying Living Expenses: Somewhat hard  Food Insecurity: Food Insecurity Present (10/06/2023)   Hunger Vital Sign    Worried About Running Out of Food in the Last Year: Sometimes true    Ran Out of Food in the Last Year:  Never true  Transportation Needs: No Transportation Needs (10/06/2023)   PRAPARE - Administrator, Civil Service (Medical): No    Lack of Transportation (Non-Medical): No  Physical Activity: Unknown (10/06/2023)   Exercise Vital Sign    Days of Exercise per Week: 0 days    Minutes of Exercise per Session: Not on file  Stress: No Stress Concern Present (10/06/2023)   [employer] of Occupational Health - Occupational Stress Questionnaire    Feeling of Stress : Only a little  Social Connections: Moderately Integrated (10/06/2023)   Social Connection and Isolation Panel [NHANES]    Frequency of Communication with Friends and Family: More than three times a week    Frequency of Social Gatherings with Friends and Family: Twice a week    Attends Religious Services: More than 4 times per year    Active Member of [employer] or Organizations: Yes    Attends Banker Meetings: More than 4 times per year    Marital Status: Widowed  Intimate Partner Violence: Unknown (01/25/2022)   Received from [facility], [facility]   HITS    Physically Hurt: Not on file    Insult or Talk Down To: Not on file    Threaten Physical Harm: Not on file    Scream or Curse: Not on file    Review of Systems   General: Negative for anorexia, weight loss, fever, chills, fatigue, weakness. Eyes: Negative for vision changes.  ENT: Negative for hoarseness, difficulty swallowing , nasal congestion. CV: Negative for chest pain, angina, palpitations, dyspnea on exertion, peripheral edema.  Respiratory: Negative for dyspnea at rest, dyspnea on exertion, cough, sputum, wheezing.  GI: See history of present illness. GU:  Negative for dysuria, hematuria, urinary incontinence, urinary frequency, nocturnal urination.  MS: Negative for joint pain, low back pain.  Derm: Negative for rash or itching.  Neuro: Negative for weakness, abnormal sensation, seizure, frequent headaches, memory loss,   confusion.  Psych: Negative for anxiety, depression, suicidal ideation, hallucinations.  Endo: Negative for unusual weight change.  Heme: Negative for bruising or bleeding. Allergy: Negative for rash or hives.  Physical Exam   There were no vitals taken for this visit.   General: Well-nourished, well-developed in no acute distress.  Head: Normocephalic,  atraumatic.   Eyes: Conjunctiva pink, no icterus. Mouth: Oropharyngeal mucosa moist and pink , no lesions erythema or exudate. Neck: Supple without thyromegaly, masses, or lymphadenopathy.  Lungs: Clear to auscultation bilaterally.  Heart: Regular rate and rhythm, no murmurs rubs or gallops.  Abdomen: Bowel sounds are normal, nontender, nondistended, no hepatosplenomegaly or masses,  no abdominal bruits or hernia, no rebound or guarding.   Rectal: *** Extremities: No lower extremity edema. No clubbing or deformities.  Neuro: Alert and oriented x 4 , grossly normal neurologically.  Skin: Warm and dry, no rash or jaundice.   Psych: Alert and cooperative, normal mood and affect.  Labs   Lab Results  Component Value Date   NA 141 06/21/2023   CL 103 06/21/2023   K 4.3 06/21/2023   CO2 26 06/21/2023   BUN 12 06/21/2023   CREATININE 0.89 06/21/2023   EGFR 73 06/21/2023   CALCIUM 9.3 06/21/2023   PHOS 3.8 04/13/2022   ALBUMIN 4.0 06/21/2023   GLUCOSE 79 06/21/2023   Lab Results  Component Value Date   ALT 17 06/21/2023   AST 16 06/21/2023   ALKPHOS 70 06/21/2023   BILITOT 0.2 06/21/2023   Lab Results  Component Value Date   WBC 6.7 06/21/2023   HGB 11.8 06/21/2023   HCT 36.5 06/21/2023   MCV 92 06/21/2023   PLT 240 06/21/2023   Lab Results  Component Value Date   IRON 40 06/21/2023   TIBC 264 06/21/2023   FERRITIN 71 06/21/2023   Lab Results  Component Value Date   VITAMINB12 480 06/21/2023   Lab Results  Component Value Date   FOLATE 8.4 06/21/2023    Imaging Studies   CUP PACEART REMOTE DEVICE  CHECK Result Date: 11/09/2023 Scheduled remote reviewed. Normal device function.  3 NSVT classified events, 1 5 beats in duration, second EGM brief atrial driven 1:1. 1 brief ATR showing 1:1 Next remote 91 days. LA, CVRS   Assessment       PLAN   ***   Leanna Battles. Melvyn Neth, MHS, PA-C [facility]

## 2023-11-27 ENCOUNTER — Telehealth: Payer: Self-pay | Admitting: *Deleted

## 2023-11-27 ENCOUNTER — Ambulatory Visit (INDEPENDENT_AMBULATORY_CARE_PROVIDER_SITE_OTHER): Payer: Medicare HMO | Admitting: Gastroenterology

## 2023-11-27 ENCOUNTER — Encounter: Payer: Self-pay | Admitting: Gastroenterology

## 2023-11-27 VITALS — BP 99/66 | HR 74 | Temp 98.7°F | Ht 67.5 in | Wt 226.8 lb

## 2023-11-27 DIAGNOSIS — K581 Irritable bowel syndrome with constipation: Secondary | ICD-10-CM | POA: Diagnosis not present

## 2023-11-27 DIAGNOSIS — K219 Gastro-esophageal reflux disease without esophagitis: Secondary | ICD-10-CM | POA: Diagnosis not present

## 2023-11-27 DIAGNOSIS — K589 Irritable bowel syndrome without diarrhea: Secondary | ICD-10-CM | POA: Insufficient documentation

## 2023-11-27 DIAGNOSIS — Z860101 Personal history of adenomatous and serrated colon polyps: Secondary | ICD-10-CM | POA: Insufficient documentation

## 2023-11-27 MED ORDER — LINACLOTIDE 290 MCG PO CAPS: ORAL_CAPSULE | ORAL | Status: DC

## 2023-11-27 MED ORDER — FAMOTIDINE 20 MG PO TABS: ORAL_TABLET | ORAL | 2 refills | Status: DC

## 2023-11-27 MED ORDER — CHLORPHENIRAMINE MALEATE 4 MG PO TABS: 4.0000 mg | ORAL_TABLET | ORAL | 0 refills | Status: DC | PRN

## 2023-11-27 MED ORDER — PANTOPRAZOLE SODIUM 40 MG PO TBEC: 40.0000 mg | DELAYED_RELEASE_TABLET | Freq: Every day | ORAL | 2 refills | Status: DC

## 2023-11-27 NOTE — Telephone Encounter (Signed)
  Request for patient to stop medication prior to procedure or is needing cleareance  11/27/23  [name] [name] [name] [name] January 20, 1961  What type of surgery is being performed? Colonoscopy/Esophagogastroduodenoscopy (EGD)  When is surgery scheduled? TBD  What type of clearance is required (medical or pharmacy to hold medication or both? BOTH  Patient is needing to have medical clearance and clearance to hold Eliquis x 2 days prior to procedure.  Name of physician performing surgery?  Dr.Carver [facility] [employer]: [phone] Fax: [phone]  Anethesia type (none, local, MAC, general)? MAC

## 2023-11-27 NOTE — Patient Instructions (Signed)
We will get cardiac clearance for colonoscopy/upper endoscopy. We will be in touch to schedule in near future.  Continue pantoprazole 40mg  daily before breakfast. Continue famotidine 20mg  after supper.  Samples of Linzess provided to take starting one week before your colonoscopy to get bowels moving.

## 2023-11-28 ENCOUNTER — Encounter: Payer: Self-pay | Admitting: Gastroenterology

## 2023-11-28 NOTE — Telephone Encounter (Signed)
 Patient with diagnosis of atrial fibrilation and hx of recurrent PE on Eliquis  for anticoagulation.    Procedure: Colonoscopy/Esophagogastroduodenoscopy (EGD) Date of procedure: TBD   CHA2DS2-VASc Score = 3   This indicates a 3.2% annual risk of stroke. The patient's score is based upon: CHF History: 1 HTN History: 1 Diabetes History: 0 Stroke History: 0 Vascular Disease History: 0 Age Score: 0 Gender Score: 1     CrCl >100 mL/min Platelet count 286 K   Per office protocol, patient can hold Eliquis  for 2 days prior to procedure.     **This guidance is not considered finalized until pre-operative APP has relayed final recommendations.**

## 2023-11-28 NOTE — Telephone Encounter (Signed)
 Dr. Fernande,  Ms. Rosamilia 63 year old female is requesting preoperative cardiac evaluation for colonoscopy/EGD.  Procedure is TBD.  She was recently seen by you in clinic on 10/10/2023.  She remained stable from a cardiac standpoint at that time.  Would you be able to provide recommendations on cardiac risk for her upcoming procedure?  Thank you for your help.  Please direct your response to CV DIV preop will.  Josefa HERO. Alfredo Collymore NP-C     11/28/2023, 9:23 AM [facility] [address] Office [phone] Fax [phone]

## 2023-12-01 NOTE — Telephone Encounter (Signed)
   Name: [name] [name]  DOB: [birth year]  MRN: [id number]  Primary Cardiologist: Vina Gull, MD   Preoperative team, please contact this patient and set up a phone call appointment for further preoperative risk assessment. Please obtain consent and complete medication review. Thank you for your help.  I confirm that guidance regarding antiplatelet and oral anticoagulation therapy has been completed and, if necessary, noted below.  Per office protocol, patient can hold Eliquis  for 2 days prior to procedure.   I also confirmed the patient resides in the state of [place] . As per [facility] Medical Board telemedicine laws, the patient must reside in the state in which the provider is licensed.   [name], NP 12/01/2023, 10:55 AM [facility]

## 2023-12-04 ENCOUNTER — Telehealth: Payer: Self-pay | Admitting: *Deleted

## 2023-12-04 NOTE — Telephone Encounter (Signed)
 Pt has been scheduled tele preop appt 12/18/23. Med rec and consent are done.

## 2023-12-04 NOTE — Telephone Encounter (Signed)
 Pt has been scheduled tele preop appt 12/18/23. Med rec and consent are done.      Patient Consent for Virtual Visit        [name] [name] has provided verbal consent on 12/04/2023 for a virtual visit (video or telephone).   CONSENT FOR VIRTUAL VISIT FOR:  [name] [name]  By participating in this virtual visit I agree to the following:  I hereby voluntarily request, consent and authorize [facility] and its employed or contracted physicians, physician assistants, nurse practitioners or other licensed health care professionals (the Practitioner), to provide me with telemedicine health care services (the "Services") as deemed necessary by the treating Practitioner. I acknowledge and consent to receive the Services by the Practitioner via telemedicine. I understand that the telemedicine visit will involve communicating with the Practitioner through live audiovisual communication technology and the disclosure of certain medical information by electronic transmission. I acknowledge that I have been given the opportunity to request an in-person assessment or other available alternative prior to the telemedicine visit and am voluntarily participating in the telemedicine visit.  I understand that I have the right to withhold or withdraw my consent to the use of telemedicine in the course of my care at any time, without affecting my right to future care or treatment, and that the Practitioner or I may terminate the telemedicine visit at any time. I understand that I have the right to inspect all information obtained and/or recorded in the course of the telemedicine visit and may receive copies of available information for a reasonable fee.  I understand that some of the potential risks of receiving the Services via telemedicine include:  Delay or interruption in medical evaluation due to technological equipment failure or disruption; Information transmitted may not be sufficient (e.g. poor resolution  of images) to allow for appropriate medical decision making by the Practitioner; and/or  In rare instances, security protocols could fail, causing a breach of personal health information.  Furthermore, I acknowledge that it is my responsibility to provide information about my medical history, conditions and care that is complete and accurate to the best of my ability. I acknowledge that Practitioner's advice, recommendations, and/or decision may be based on factors not within their control, such as incomplete or inaccurate data provided by me or distortions of diagnostic images or specimens that may result from electronic transmissions. I understand that the practice of medicine is not an exact science and that Practitioner makes no warranties or guarantees regarding treatment outcomes. I acknowledge that a copy of this consent can be made available to me via my patient portal [facility] MyChart), or I can request a printed copy by calling the office of [facility].    I understand that my insurance will be billed for this visit.   I have read or had this consent read to me. I understand the contents of this consent, which adequately explains the benefits and risks of the Services being provided via telemedicine.  I have been provided ample opportunity to ask questions regarding this consent and the Services and have had my questions answered to my satisfaction. I give my informed consent for the services to be provided through the use of telemedicine in my medical care

## 2023-12-11 ENCOUNTER — Other Ambulatory Visit (HOSPITAL_COMMUNITY): Payer: Self-pay | Admitting: Family Medicine

## 2023-12-11 ENCOUNTER — Encounter: Payer: Self-pay | Admitting: Internal Medicine

## 2023-12-12 MED ORDER — APIXABAN 5 MG PO TABS: 5.0000 mg | ORAL_TABLET | Freq: Two times a day (BID) | ORAL | 1 refills | Status: DC

## 2023-12-18 ENCOUNTER — Ambulatory Visit: Payer: Medicare HMO | Attending: Cardiovascular Disease

## 2023-12-18 DIAGNOSIS — Z01810 Encounter for preprocedural cardiovascular examination: Secondary | ICD-10-CM

## 2023-12-18 NOTE — Progress Notes (Signed)
 Virtual Visit via Telephone Note   Because of [name] co-morbid illnesses, she is at least at moderate risk for complications without adequate follow up.  This format is felt to be most appropriate for this patient at this time.  Due to technical limitations with video connection (technology), today's appointment will be conducted as an audio only telehealth visit, and [name] verbally agreed to proceed in this manner.   All issues noted in this document were discussed and addressed.  No physical exam could be performed with this format.  Evaluation Performed:  Preoperative cardiovascular risk assessment _____________   Date:  12/18/2023   Patient ID:  [name] [name], DOB [birth year], MRN [id number] Patient Location:  Home Provider location:   Office  Primary Care Provider:  [name], DO Primary Cardiologist:  Dietrich Pates, MD  Chief Complaint / Patient Profile   63 y.o. y/o female with a h/o history of breast CAD s/p lumpectomy and XRT, hypothyroidism, current PE (on lifetime Eliquis) HLD, TIA, GERD, HFrEF, NICM s/p AICD who is pending colonoscopy and EGD and presents today for telephonic preoperative cardiovascular risk assessment.  History of Present Illness    [name] [name] is a 63 y.o. female who presents via audio/video conferencing for a telehealth visit today.  Pt was last seen in cardiology clinic on 10/10/2023 by Dr. Graciela Husbands and was seen by Dr. Tenny Craw on 08/25/2023.  at that time NELSIE DOMINO was doing well with no new cardiac complaints but endorsed intermittent palpitations/A-fib.  The patient is now pending procedure as outlined above. Since her last visit, she has been doing well but did contract COVID last month but reports no residual effects.  She also reports that her palpitations have been under better control with additional as needed carvedilol.  She denies chest pain, shortness of breath, lower extremity edema, fatigue, palpitations, melena, hematuria,  hemoptysis, diaphoresis, weakness, presyncope, syncope, orthopnea, and PND.    Past Medical History    Past Medical History:  Diagnosis Date   A-fib [facility])    AICD (automatic cardioverter/defibrillator) present 2009   [employer]- replaced 2016   Arthritis    Bradycardia    Breast cancer (HCC) 02/24/2016   s/p lumpectomy and radiation but refused chemotherapy   Chronic systolic CHF (congestive heart failure) (HCC)    Cough due to angiotensin-converting enzyme inhibitor    Dizziness    Dizziness with standing after squatting, March, 2012   DVT (deep venous thrombosis) (HCC)    Dyslipidemia    LDL elevation,Patient does not want statin   Family history of breast cancer    GERD (gastroesophageal reflux disease)    H/O shortness of breath    CPX 10/18:  No evidence of cardiopulmonary limitation. Suspect exercise intolerance related to weight and deconditioning.    Hypothyroidism    ICD (implantable cardiac defibrillator) battery depletion    Dr. Graciela Husbands, June, 2009(artifactual atrial tachycardia response events addressed by reprogramming in parentheses   Leg swelling    right leg   Mitral regurgitation    Nonischemic cardiomyopathy (HCC)    Etiology unknown, diagnosed 2008   Paroxysmal atrial fibrillation (HCC) 06/20/2022   PE (pulmonary thromboembolism) (HCC)    Personal history of radiation therapy 2017   Pneumonia due to COVID-19 virus 10/2019   TIA (transient ischemic attack)    No CT or MRI abnormality, aspirin therapy   TIA (transient ischemic attack) 2007   Past Surgical History:  Procedure Laterality Date  BREAST BIOPSY Left 02/24/2016   U/S Core   BREAST BIOPSY Left 02/24/2016   U/S Core   BREAST LUMPECTOMY Left 04/05/2016   invasive ductal    BREAST LUMPECTOMY WITH RADIOACTIVE SEED AND SENTINEL LYMPH NODE BIOPSY Left 04/05/2016   Procedure: BREAST LUMPECTOMY WITH RADIOACTIVE SEED AND SENTINEL LYMPH NODE BIOPSY;  Surgeon: [name], MD;  Location: [facility] OR;   Service: General;  Laterality: Left;   CARDIAC CATHETERIZATION N/A 08/05/2016   Procedure: Left Heart Cath and Coronary Angiography;  Surgeon: Peter M Swaziland, MD;  Location: [facility] INVASIVE CV LAB;  Service: Cardiovascular;  Laterality: N/A;   CARDIAC DEFIBRILLATOR PLACEMENT  09   [employer] no remote   COLONOSCOPY     CYST EXCISION Left    back of leg-lt    IMPLANTABLE CARDIOVERTER DEFIBRILLATOR GENERATOR CHANGE N/A 12/29/2014   Procedure: IMPLANTABLE CARDIOVERTER DEFIBRILLATOR GENERATOR CHANGE;  Surgeon: [name], MD;  Location: [facility] CATH LAB;  Service: Cardiovascular;  Laterality: N/A;   LATERAL EPICONDYLE RELEASE  10/11/2012   Procedure: TENNIS ELBOW RELEASE;  Surgeon: Loreta Ave, MD;  Location: [facility];  Service: Orthopedics;  Laterality: Right;  RIGHT ELBOW: TENOTOMY ELBOW LATERAL EPICONDYLITIS TENNIS ELBOW: RADIAL TUNNEL RELEASE   PORT-A-CATH REMOVAL  04/13/2016   Procedure: MINOR REMOVAL PORT-A-CATH;  Surgeon: Manus Rudd, MD;  Location: [facility];  Service: General;;   PORTACATH PLACEMENT Right 04/05/2016   Procedure: INSERTION PORT-A-CATH WITH [place];  Surgeon: [name], MD;  Location: [facility] OR;  Service: General;  Laterality: Right;  right IJ   TOTAL THYROIDECTOMY  2001   TUBAL LIGATION  94   UPPER GI ENDOSCOPY      Allergies  Allergies  Allergen Reactions   Avelox [Moxifloxacin Hcl In Nacl] Shortness Of Breath    Shortness of breath   Clindamycin/Lincomycin Shortness Of Breath and Diarrhea    Breathing/diarrhea     Kiwi Extract Anaphylaxis    Makes pt. Feel like her throat is closing    Bactrim [Sulfamethoxazole-Trimethoprim] Swelling   Levofloxacin Other (See Comments)    JOINT PAIN JOINT PAIN   Simvastatin Other (See Comments)    Joint pain   Spironolactone Other (See Comments)    burning   Moxifloxacin Hcl Other (See Comments)   Tramadol Nausea Only    Home Medications    Prior to Admission medications    Medication Sig Start Date End Date Taking? Authorizing Provider  acetaminophen (TYLENOL) 500 MG tablet Take 500 mg by mouth every 6 (six) hours as needed for mild pain or moderate pain.    [provider]  apixaban (ELIQUIS) 5 MG TABS tablet Take 1 tablet (5 mg total) by mouth 2 (two) times daily. 12/12/23   [name], MD  carvedilol (COREG) 3.125 MG tablet Take 1 tablet (3.125 mg total) by mouth 2 (two) times daily. NEEDS FOLLOW UP APPOINTMENT FOR ANYMORE REFILLS 04/26/23   Meriam Sprague, MD  chlorpheniramine (CHLOR-TRIMETON) 4 MG tablet Take 1 tablet (4 mg total) by mouth every 4 (four) hours as needed for allergies. 11/27/23   Tiffany Kocher, PA-C  eplerenone (INSPRA) 25 MG tablet TAKE 1 TABLET BY MOUTH ONCE DAILY. 12/07/22   Perlie Gold, PA-C  famotidine (PEPCID) 20 MG tablet One after supper 11/27/23   Tiffany Kocher, PA-C  fluticasone [facility]) 50 MCG/ACT nasal spray Place 2 sprays into both nostrils daily. 11/01/23   Sonny Masters, FNP  levothyroxine (SYNTHROID) 75 MCG tablet TAKE 1 TABLET  BY MOUTH ONCE DAILY BEFORE BREAKFAST 06/27/23   Delynn Flavin M, DO  linaclotide Karlene Einstein) 290 MCG CAPS capsule Take one daily starting one week before your colonoscopy 11/27/23   Tiffany Kocher, PA-C  pantoprazole (PROTONIX) 40 MG tablet Take 1 tablet (40 mg total) by mouth daily before breakfast. Take 30-60 min before first meal of the day 11/27/23   Tiffany Kocher, PA-C  rosuvastatin (CRESTOR) 10 MG tablet Take 1 tablet (10 mg total) by mouth daily. 08/30/23 11/28/23  [name], MD  valsartan (DIOVAN) 80 MG tablet Take 1 tablet (80 mg total) by mouth 2 (two) times daily. 03/27/23   Nyoka Cowden, MD    Physical Exam    Vital Signs:  CHINARA HERTZBERG does not have vital signs available for review today.  Given telephonic nature of communication, physical exam is limited. AAOx3. NAD. Normal affect.  Speech and respirations are unlabored.  Accessory Clinical Findings     None  Assessment & Plan    1.  Preoperative Cardiovascular Risk Assessment: -Patient's RCRI score is 11% -The patient affirms she has been doing well without any new cardiac symptoms. They are able to achieve 7 METS without cardiac limitations. Therefore, based on ACC/AHA guidelines, the patient would be at acceptable risk for the planned procedure without further cardiovascular testing. The patient was advised that if she develops new symptoms prior to surgery to contact our office to arrange for a follow-up visit, and she verbalized understanding.   The patient was advised that if she develops new symptoms prior to surgery to contact our office to arrange for a follow-up visit, and she verbalized understanding.  Per office protocol, patient can hold Eliquis for 2 days prior to procedure.   A copy of this note will be routed to requesting surgeon.  Time:   Today, I have spent 8 minutes with the patient with telehealth technology discussing medical history, symptoms, and management plan.     Napoleon Form, Leodis Rains, NP  12/18/2023, 10:55 AM

## 2023-12-18 NOTE — Progress Notes (Signed)
 Remote ICD transmission.

## 2023-12-20 ENCOUNTER — Encounter: Payer: Self-pay | Admitting: Internal Medicine

## 2023-12-22 ENCOUNTER — Ambulatory Visit (INDEPENDENT_AMBULATORY_CARE_PROVIDER_SITE_OTHER): Payer: Medicare HMO | Admitting: Family Medicine

## 2023-12-22 ENCOUNTER — Encounter: Payer: Self-pay | Admitting: Family Medicine

## 2023-12-22 VITALS — BP 113/71 | HR 68 | Temp 98.3°F | Ht 67.0 in | Wt 224.0 lb

## 2023-12-22 DIAGNOSIS — E669 Obesity, unspecified: Secondary | ICD-10-CM | POA: Diagnosis not present

## 2023-12-22 DIAGNOSIS — E890 Postprocedural hypothyroidism: Secondary | ICD-10-CM

## 2023-12-22 DIAGNOSIS — Z6379 Other stressful life events affecting family and household: Secondary | ICD-10-CM | POA: Diagnosis not present

## 2023-12-22 DIAGNOSIS — Z86718 Personal history of other venous thrombosis and embolism: Secondary | ICD-10-CM | POA: Diagnosis not present

## 2023-12-22 DIAGNOSIS — Z6835 Body mass index (BMI) 35.0-35.9, adult: Secondary | ICD-10-CM

## 2023-12-22 DIAGNOSIS — I428 Other cardiomyopathies: Secondary | ICD-10-CM

## 2023-12-22 DIAGNOSIS — R7303 Prediabetes: Secondary | ICD-10-CM | POA: Diagnosis not present

## 2023-12-22 LAB — BAYER DCA HB A1C WAIVED: HB A1C (BAYER DCA - WAIVED): 5.7 % — ABNORMAL HIGH (ref 4.8–5.6)

## 2023-12-22 MED ORDER — APIXABAN 5 MG PO TABS: 5.0000 mg | ORAL_TABLET | Freq: Two times a day (BID) | ORAL | 4 refills | Status: DC

## 2023-12-22 MED ORDER — LEVOTHYROXINE SODIUM 75 MCG PO TABS: 75.0000 ug | ORAL_TABLET | Freq: Every day | ORAL | 4 refills | Status: DC

## 2023-12-22 NOTE — Progress Notes (Signed)
 Subjective: [id number] PCP: [name] Ip, DO [id number] [name] is a 63 y.o. female presenting to clinic today for:  1.  Hypothyroidism/constipation, prediabetes Patient is compliant with medications.  She reports some chronic constipation that is been flaring up.  She is under the care of physician assistant Lewis at [facility] and there are plans for endoscopy for LPR and colonoscopy as she will be due for this.  She has been utilizing MiraLAX and magnesium citrate efforts to clean her bowels out but she does not feel like she is totally cleaned out yet.  She will be doing a prep with Linzess to 90 mcg 1 week prior to colonoscopy.  She is compliant with Pepcid, Protonix.  She admits that she is not eating a proper diet and has been stressed eating because her daughter may be going through a miscarriage.  She has been eating a lot of bread, rice   ROS: Per HPI  Allergies  Allergen Reactions   Avelox [Moxifloxacin Hcl In Nacl] Shortness Of Breath    Shortness of breath   Clindamycin/Lincomycin Shortness Of Breath and Diarrhea    Breathing/diarrhea     Kiwi Extract Anaphylaxis    Makes pt. Feel like her throat is closing    Bactrim [Sulfamethoxazole-Trimethoprim] Swelling   Levofloxacin Other (See Comments)    JOINT PAIN JOINT PAIN   Simvastatin Other (See Comments)    Joint pain   Spironolactone Other (See Comments)    burning   Moxifloxacin Hcl Other (See Comments)   Tramadol Nausea Only   Past Medical History:  Diagnosis Date   A-fib (HCC)    Acute deep vein thrombosis (DVT) of right popliteal vein (HCC) 04/13/2022   Acute pulmonary embolism (HCC) 04/12/2022   AICD (automatic cardioverter/defibrillator) present 2009   [employer]- replaced 2016   Arthritis    Bradycardia    Breast cancer (HCC) 02/24/2016   s/p lumpectomy and radiation but refused chemotherapy   Chronic systolic CHF (congestive heart failure) (HCC)    Cough due to  angiotensin-converting enzyme inhibitor    Deep venous thrombosis of lower extremity (HCC) 05/24/2023   Dizziness    Dizziness with standing after squatting, March, 2012   DVT (deep venous thrombosis) (HCC)    Dyslipidemia    LDL elevation,Patient does not want statin   Family history of breast cancer    GERD (gastroesophageal reflux disease)    H/O shortness of breath    CPX 10/18:  No evidence of cardiopulmonary limitation. Suspect exercise intolerance related to weight and deconditioning.    Hypothyroidism    ICD (implantable cardiac defibrillator) battery depletion    Dr. Graciela Husbands, June, 2009(artifactual atrial tachycardia response events addressed by reprogramming in parentheses   Leg swelling    right leg   Mitral regurgitation    Nonischemic cardiomyopathy (HCC)    Etiology unknown, diagnosed 2008   Paroxysmal atrial fibrillation (HCC) 06/20/2022   PE (pulmonary thromboembolism) (HCC)    Personal history of radiation therapy 2017   Pneumonia due to COVID-19 virus 10/2019   Pulmonary embolism (HCC) 04/21/2020   TIA (transient ischemic attack)    No CT or MRI abnormality, aspirin therapy   TIA (transient ischemic attack) 2007    Current Outpatient Medications:    acetaminophen (TYLENOL) 500 MG tablet, Take 500 mg by mouth every 6 (six) hours as needed for mild pain or moderate pain., Disp: , Rfl:    apixaban (ELIQUIS) 5 MG TABS tablet, Take 1 tablet (5 mg  total) by mouth 2 (two) times daily., Disp: 180 tablet, Rfl: 1   carvedilol (COREG) 3.125 MG tablet, Take 1 tablet (3.125 mg total) by mouth 2 (two) times daily. NEEDS FOLLOW UP APPOINTMENT FOR ANYMORE REFILLS, Disp: 180 tablet, Rfl: 3   chlorpheniramine (CHLOR-TRIMETON) 4 MG tablet, Take 1 tablet (4 mg total) by mouth every 4 (four) hours as needed for allergies., Disp: 14 tablet, Rfl: 0   eplerenone (INSPRA) 25 MG tablet, TAKE 1 TABLET BY MOUTH ONCE DAILY., Disp: 90 tablet, Rfl: 3   famotidine (PEPCID) 20 MG tablet, One after  supper, Disp: 90 tablet, Rfl: 2   fluticasone (FLONASE) 50 MCG/ACT nasal spray, Place 2 sprays into both nostrils daily., Disp: 16 g, Rfl: 6   levothyroxine (SYNTHROID) 75 MCG tablet, TAKE 1 TABLET BY MOUTH ONCE DAILY BEFORE BREAKFAST, Disp: 90 tablet, Rfl: 2   linaclotide (LINZESS) 290 MCG CAPS capsule, Take one daily starting one week before your colonoscopy, Disp: 8 capsule, Rfl:    pantoprazole (PROTONIX) 40 MG tablet, Take 1 tablet (40 mg total) by mouth daily before breakfast. Take 30-60 min before first meal of the day, Disp: 90 tablet, Rfl: 2   rosuvastatin (CRESTOR) 10 MG tablet, Take 1 tablet (10 mg total) by mouth daily., Disp: 90 tablet, Rfl: 3   valsartan (DIOVAN) 80 MG tablet, Take 1 tablet (80 mg total) by mouth 2 (two) times daily., Disp: 60 tablet, Rfl: 11 Social History   Socioeconomic History   Marital status: Widowed    Spouse name: Not on file   Number of children: Not on file   Years of education: Not on file   Highest education level: Some college, no degree  Occupational History   Occupation: Widow  Tobacco Use   Smoking status: Never   Smokeless tobacco: Never  Vaping Use   Vaping status: Never Used  Substance and Sexual Activity   Alcohol use: No   Drug use: No   Sexual activity: Not Currently    Birth control/protection: Surgical    Comment: tubal  Other Topics Concern   Not on file  Social History Narrative   Not on file   Social Drivers of Health   Financial Resource Strain: Medium Risk (10/06/2023)   Overall Financial Resource Strain (CARDIA)    Difficulty of Paying Living Expenses: Somewhat hard  Food Insecurity: Food Insecurity Present (10/06/2023)   Hunger Vital Sign    Worried About Running Out of Food in the Last Year: Sometimes true    Ran Out of Food in the Last Year: Never true  Transportation Needs: No Transportation Needs (10/06/2023)   PRAPARE - Administrator, Civil Service (Medical): No    Lack of Transportation  (Non-Medical): No  Physical Activity: Unknown (10/06/2023)   Exercise Vital Sign    Days of Exercise per Week: 0 days    Minutes of Exercise per Session: Not on file  Stress: No Stress Concern Present (10/06/2023)   [employer] of Occupational Health - Occupational Stress Questionnaire    Feeling of Stress : Only a little  Social Connections: Moderately Integrated (10/06/2023)   Social Connection and Isolation Panel [NHANES]    Frequency of Communication with Friends and Family: More than three times a week    Frequency of Social Gatherings with Friends and Family: Twice a week    Attends Religious Services: More than 4 times per year    Active Member of [employer] or Organizations: Yes    Attends Club or  Organization Meetings: More than 4 times per year    Marital Status: Widowed  Intimate Partner Violence: Unknown (01/25/2022)   Received from [facility]   HITS    Physically Hurt: Not on file    Insult or Talk Down To: Not on file    Threaten Physical Harm: Not on file    Scream or Curse: Not on file   Family History  Problem Relation Age of Onset   Heart attack Mother    Cancer Mother        breast   Breast cancer Mother 104   Hypertension Sister    Hypertension Brother    Cancer Paternal Grandmother        possible cancer, unknown type   Cancer Maternal Aunt        breast   Breast cancer Maternal Aunt    Stroke Neg Hx    Diabetes Neg Hx    Colon cancer Neg Hx     Objective: Office vital signs reviewed. BP 113/71   Pulse 68   Temp 98.3 F (36.8 C)   Ht 5\' 7"  (1.702 m)   Wt 224 lb (101.6 kg)   SpO2 97%   BMI 35.08 kg/m   Physical Examination:  General: Awake, alert, well nourished, No acute distress HEENT:sclera white, MMM.  No exophthalmos.  No goiter Cardio: regular rate and rhythm, S1S2 heard, no murmurs appreciated Pulm: clear to auscultation bilaterally, no wheezes, rhonchi or rales; normal work of breathing on room air    Assessment/  Plan: 63 y.o. female   Post-surgical hypothyroidism - Plan: TSH + free T4, levothyroxine (SYNTHROID) 75 MCG tablet  Pre-diabetes - Plan: Bayer DCA Hb A1c Waived  Obesity (BMI 30-39.9)  Stress due to illness of family member  History of recurrent deep vein thrombosis (DVT) - Plan: apixaban (ELIQUIS) 5 MG TABS tablet  NICM (nonischemic cardiomyopathy) (HCC)  Check thyroid levels.  Having constipation but this is likely a chronic issue due to dietary noncompliance  Check A1c.  Reinforced need for lower caloric density foods if snacking  Offered her therapy but she declined needing this at this time  Will need lifelong Eliquis due to recurrent DVT.  I have renewed this for the patient for 1 year  I have reached out to her cardiologist regarding her intermittent compliance with statin.  She has myalgia and I wonder if maybe she would be a better candidate for Repatha or Nexlizet.  Additionally, I would like to try and get her [facility] for weight loss and cardiovascular benefits but she is going to reach out to her insurance company to determine if this is something that is covered by them  [name] Ip, DO [facility] [phone]

## 2023-12-23 LAB — TSH+FREE T4
Free T4: 1.55 ng/dL (ref 0.82–1.77)
TSH: 1.99 u[IU]/mL (ref 0.450–4.500)

## 2023-12-27 ENCOUNTER — Encounter: Payer: Self-pay | Admitting: *Deleted

## 2023-12-27 ENCOUNTER — Other Ambulatory Visit: Payer: Self-pay | Admitting: *Deleted

## 2023-12-27 MED ORDER — PEG 3350-KCL-NA BICARB-NACL 420 G PO SOLR: 4000.0000 mL | Freq: Once | ORAL | 0 refills | Status: AC

## 2023-12-27 NOTE — Telephone Encounter (Signed)
 Pt has been scheduled for 01/19/24. Instructions mailed and prep sent to the pharmacy

## 2023-12-27 NOTE — Telephone Encounter (Signed)
 Cardiac clearance provided. Ok to schedule. Hold eliquis 48 hours. Other orders as per encounter form.

## 2024-01-01 ENCOUNTER — Other Ambulatory Visit: Payer: Self-pay

## 2024-01-01 MED ORDER — EPLERENONE 25 MG PO TABS: ORAL_TABLET | ORAL | 2 refills | Status: DC

## 2024-01-07 NOTE — Progress Notes (Signed)
 REcheck lipomed 4 months after stopping statin along with Lpa and A1C

## 2024-01-08 ENCOUNTER — Other Ambulatory Visit: Payer: Self-pay

## 2024-01-08 DIAGNOSIS — Z79899 Other long term (current) drug therapy: Secondary | ICD-10-CM

## 2024-01-08 DIAGNOSIS — I10 Essential (primary) hypertension: Secondary | ICD-10-CM

## 2024-01-08 DIAGNOSIS — E782 Mixed hyperlipidemia: Secondary | ICD-10-CM

## 2024-01-08 DIAGNOSIS — E7849 Other hyperlipidemia: Secondary | ICD-10-CM

## 2024-01-08 DIAGNOSIS — I480 Paroxysmal atrial fibrillation: Secondary | ICD-10-CM

## 2024-01-08 DIAGNOSIS — E66811 Obesity, class 1: Secondary | ICD-10-CM

## 2024-01-16 ENCOUNTER — Encounter: Payer: Self-pay | Admitting: Internal Medicine

## 2024-01-16 NOTE — Progress Notes (Signed)
 PERIOPERATIVE PRESCRIPTION FOR IMPLANTED CARDIAC DEVICE PROGRAMMING  Patient Information: Name:  [name] [name]  DOB:  1960-12-30  MRN:  [id number]  Planned Procedure:  COLONOSCOPY  Surgeon:  Lanelle Bal, DO  Date of Procedure: 01/19/2024 Position during surgery:  left lateral  Device Information:  Clinic EP Physician:  Sherryl Manges, MD   Device Type:  Defibrillator Manufacturer and Phone #:  [employer]: [phone] Pacemaker Dependent?:  No. Date of Last Device Check:  11/09/2023 Normal Device Function?:  Yes.    Electrophysiologist's Recommendations:  Have magnet available. Provide continuous ECG monitoring when magnet is used or reprogramming is to be performed.  Procedure should not interfere with device function.  No device programming or magnet placement needed.  Per Device Clinic Standing Orders, Wiliam Ke, RN  4:43 PM 01/16/2024

## 2024-01-17 ENCOUNTER — Other Ambulatory Visit: Payer: Self-pay

## 2024-01-17 ENCOUNTER — Encounter (HOSPITAL_COMMUNITY)
Admission: RE | Admit: 2024-01-17 | Discharge: 2024-01-17 | Disposition: A | Discharge status: Home / Self Care | Source: Ambulatory Visit | Attending: Internal Medicine | Admitting: Internal Medicine

## 2024-01-17 ENCOUNTER — Encounter (HOSPITAL_COMMUNITY): Payer: Self-pay

## 2024-01-19 ENCOUNTER — Encounter (HOSPITAL_COMMUNITY)
Admission: RE | Disposition: A | Discharge status: Home / Self Care | Payer: Self-pay | Source: Home / Self Care | Attending: Internal Medicine

## 2024-01-19 ENCOUNTER — Ambulatory Visit (HOSPITAL_BASED_OUTPATIENT_CLINIC_OR_DEPARTMENT_OTHER): Payer: Self-pay | Admitting: Certified Registered Nurse Anesthetist

## 2024-01-19 ENCOUNTER — Ambulatory Visit (HOSPITAL_COMMUNITY): Payer: Self-pay | Admitting: Certified Registered Nurse Anesthetist

## 2024-01-19 ENCOUNTER — Encounter (HOSPITAL_COMMUNITY): Payer: Self-pay | Admitting: Internal Medicine

## 2024-01-19 ENCOUNTER — Ambulatory Visit (HOSPITAL_COMMUNITY)
Admission: RE | Admit: 2024-01-19 | Discharge: 2024-01-19 | Disposition: A | Discharge status: Home / Self Care | Attending: Internal Medicine | Admitting: Internal Medicine

## 2024-01-19 DIAGNOSIS — I509 Heart failure, unspecified: Secondary | ICD-10-CM | POA: Diagnosis not present

## 2024-01-19 DIAGNOSIS — Z5986 Financial insecurity: Secondary | ICD-10-CM | POA: Insufficient documentation

## 2024-01-19 DIAGNOSIS — D120 Benign neoplasm of cecum: Secondary | ICD-10-CM

## 2024-01-19 DIAGNOSIS — Z95810 Presence of automatic (implantable) cardiac defibrillator: Secondary | ICD-10-CM | POA: Insufficient documentation

## 2024-01-19 DIAGNOSIS — Z8673 Personal history of transient ischemic attack (TIA), and cerebral infarction without residual deficits: Secondary | ICD-10-CM | POA: Insufficient documentation

## 2024-01-19 DIAGNOSIS — K2289 Other specified disease of esophagus: Secondary | ICD-10-CM | POA: Diagnosis not present

## 2024-01-19 DIAGNOSIS — K648 Other hemorrhoids: Secondary | ICD-10-CM | POA: Diagnosis not present

## 2024-01-19 DIAGNOSIS — D126 Benign neoplasm of colon, unspecified: Secondary | ICD-10-CM | POA: Diagnosis not present

## 2024-01-19 DIAGNOSIS — K2970 Gastritis, unspecified, without bleeding: Secondary | ICD-10-CM

## 2024-01-19 DIAGNOSIS — D122 Benign neoplasm of ascending colon: Secondary | ICD-10-CM

## 2024-01-19 DIAGNOSIS — I110 Hypertensive heart disease with heart failure: Secondary | ICD-10-CM | POA: Diagnosis not present

## 2024-01-19 DIAGNOSIS — Z1211 Encounter for screening for malignant neoplasm of colon: Secondary | ICD-10-CM

## 2024-01-19 DIAGNOSIS — R12 Heartburn: Secondary | ICD-10-CM | POA: Diagnosis not present

## 2024-01-19 DIAGNOSIS — E890 Postprocedural hypothyroidism: Secondary | ICD-10-CM | POA: Diagnosis not present

## 2024-01-19 DIAGNOSIS — I5022 Chronic systolic (congestive) heart failure: Secondary | ICD-10-CM | POA: Insufficient documentation

## 2024-01-19 DIAGNOSIS — K635 Polyp of colon: Secondary | ICD-10-CM | POA: Diagnosis not present

## 2024-01-19 DIAGNOSIS — K219 Gastro-esophageal reflux disease without esophagitis: Secondary | ICD-10-CM | POA: Insufficient documentation

## 2024-01-19 HISTORY — PX: POLYPECTOMY: SHX5525

## 2024-01-19 HISTORY — PX: COLONOSCOPY: SHX5424

## 2024-01-19 HISTORY — PX: ESOPHAGOGASTRODUODENOSCOPY: SHX5428

## 2024-01-19 LAB — GLUCOSE, CAPILLARY
Glucose-Capillary: 65 mg/dL — ABNORMAL LOW (ref 70–99)
Glucose-Capillary: 78 mg/dL (ref 70–99)

## 2024-01-19 SURGERY — COLONOSCOPY
Anesthesia: General

## 2024-01-19 MED ORDER — DEXTROSE 50 % IV SOLN
25.0000 mL | INTRAVENOUS | Status: AC
  Administered 2024-01-19: 25 mL via INTRAVENOUS

## 2024-01-19 MED ORDER — VASOPRESSIN 20 UNIT/ML IV SOLN
INTRAVENOUS | Status: AC
  Filled 2024-01-19: qty 1

## 2024-01-19 MED ORDER — PROPOFOL 500 MG/50ML IV EMUL
INTRAVENOUS | Status: AC
Start: 2024-01-19 — End: ?
  Filled 2024-01-19: qty 50

## 2024-01-19 MED ORDER — PROPOFOL 10 MG/ML IV BOLUS
INTRAVENOUS | Status: DC | PRN
  Administered 2024-01-19: 60 mg via INTRAVENOUS
  Administered 2024-01-19: 125 ug/kg/min via INTRAVENOUS

## 2024-01-19 MED ORDER — LIDOCAINE HCL (PF) 2 % IJ SOLN
INTRAMUSCULAR | Status: AC
  Filled 2024-01-19: qty 5

## 2024-01-19 MED ORDER — LACTATED RINGERS IV SOLN: INTRAVENOUS | Status: DC

## 2024-01-19 MED ORDER — LIDOCAINE HCL (CARDIAC) PF 100 MG/5ML IV SOSY
PREFILLED_SYRINGE | INTRAVENOUS | Status: DC | PRN
  Administered 2024-01-19: 50 mg via INTRATRACHEAL

## 2024-01-19 MED ORDER — DEXTROSE 50 % IV SOLN
INTRAVENOUS | Status: AC
  Filled 2024-01-19: qty 50

## 2024-01-19 MED ORDER — STERILE WATER FOR IRRIGATION IR SOLN
Status: DC | PRN
  Administered 2024-01-19: 50 mL

## 2024-01-19 MED ORDER — VASOPRESSIN 20 UNIT/ML IV SOLN
INTRAVENOUS | Status: DC | PRN
  Administered 2024-01-19 (×2): .5 [IU] via INTRAVENOUS

## 2024-01-19 MED ORDER — PROPOFOL 500 MG/50ML IV EMUL
INTRAVENOUS | Status: AC
  Filled 2024-01-19: qty 50

## 2024-01-19 NOTE — Anesthesia Postprocedure Evaluation (Signed)
 Anesthesia Post Note  Patient: [name] [name]  Procedure(s) Performed: COLONOSCOPY EGD (ESOPHAGOGASTRODUODENOSCOPY) POLYPECTOMY  Patient location during evaluation: Phase II Anesthesia Type: General Level of consciousness: awake Pain management: pain level controlled Vital Signs Assessment: post-procedure vital signs reviewed and stable Respiratory status: spontaneous breathing and respiratory function stable Cardiovascular status: blood pressure returned to baseline and stable Postop Assessment: no headache and no apparent nausea or vomiting Anesthetic complications: no Comments: Late entry   No notable events documented.   Last Vitals:  Vitals:   01/19/24 0915 01/19/24 1013  BP: 133/82 121/63  Pulse:  81  Resp: 14 19  Temp: 36.8 C 36.5 C  SpO2: 100% 100%    Last Pain:  Vitals:   01/19/24 1013  TempSrc: Oral  PainSc: 0-No pain                 Windell Norfolk

## 2024-01-19 NOTE — H&P (Signed)
 Primary Care Physician:  [name], DO Primary Gastroenterologist:  Dr. Marletta Lor  Pre-Procedure History & Physical: HPI:  [name] [name] is a 63 y.o. female is here for an EGD for GERD and a colonoscopy to be performed for surveillance purposes, personal history of adenomatous colon polyps in 2020  Past Medical History:  Diagnosis Date   A-fib [facility])    Acute deep vein thrombosis (DVT) of right popliteal vein (HCC) 04/13/2022   Acute pulmonary embolism (HCC) 04/12/2022   AICD (automatic cardioverter/defibrillator) present 2009   [employer]- replaced 2016   Arthritis    Bradycardia    Breast cancer (HCC) 02/24/2016   s/p lumpectomy and radiation but refused chemotherapy   Chronic systolic CHF (congestive heart failure) (HCC)    Cough due to angiotensin-converting enzyme inhibitor    Deep venous thrombosis of lower extremity (HCC) 05/24/2023   Dizziness    Dizziness with standing after squatting, March, 2012   DVT (deep venous thrombosis) (HCC)    Dyslipidemia    LDL elevation,Patient does not want statin   Family history of breast cancer    GERD (gastroesophageal reflux disease)    H/O shortness of breath    CPX 10/18:  No evidence of cardiopulmonary limitation. Suspect exercise intolerance related to weight and deconditioning.    Hypothyroidism    ICD (implantable cardiac defibrillator) battery depletion    Dr. Graciela Husbands, June, 2009(artifactual atrial tachycardia response events addressed by reprogramming in parentheses   Leg swelling    right leg   Mitral regurgitation    Nonischemic cardiomyopathy (HCC)    Etiology unknown, diagnosed 2008   Paroxysmal atrial fibrillation (HCC) 06/20/2022   PE (pulmonary thromboembolism) (HCC)    Personal history of radiation therapy 2017   Pneumonia due to COVID-19 virus 10/2019   Pulmonary embolism (HCC) 04/21/2020   TIA (transient ischemic attack)    No CT or MRI abnormality, aspirin therapy   TIA (transient ischemic attack)  2007    Past Surgical History:  Procedure Laterality Date   BREAST BIOPSY Left 02/24/2016   U/S Core   BREAST BIOPSY Left 02/24/2016   U/S Core   BREAST LUMPECTOMY Left 04/05/2016   invasive ductal    BREAST LUMPECTOMY WITH RADIOACTIVE SEED AND SENTINEL LYMPH NODE BIOPSY Left 04/05/2016   Procedure: BREAST LUMPECTOMY WITH RADIOACTIVE SEED AND SENTINEL LYMPH NODE BIOPSY;  Surgeon: [name], MD;  Location: [facility] OR;  Service: General;  Laterality: Left;   CARDIAC CATHETERIZATION N/A 08/05/2016   Procedure: Left Heart Cath and Coronary Angiography;  Surgeon: Peter M Swaziland, MD;  Location: [facility] INVASIVE CV LAB;  Service: Cardiovascular;  Laterality: N/A;   CARDIAC DEFIBRILLATOR PLACEMENT  09   [employer] no remote   COLONOSCOPY     CYST EXCISION Left    back of leg-lt    IMPLANTABLE CARDIOVERTER DEFIBRILLATOR GENERATOR CHANGE N/A 12/29/2014   Procedure: IMPLANTABLE CARDIOVERTER DEFIBRILLATOR GENERATOR CHANGE;  Surgeon: [name], MD;  Location: [facility] CATH LAB;  Service: Cardiovascular;  Laterality: N/A;   LATERAL EPICONDYLE RELEASE  10/11/2012   Procedure: TENNIS ELBOW RELEASE;  Surgeon: Loreta Ave, MD;  Location: [facility];  Service: Orthopedics;  Laterality: Right;  RIGHT ELBOW: TENOTOMY ELBOW LATERAL EPICONDYLITIS TENNIS ELBOW: RADIAL TUNNEL RELEASE   PORT-A-CATH REMOVAL  04/13/2016   Procedure: MINOR REMOVAL PORT-A-CATH;  Surgeon: Manus Rudd, MD;  Location:  SURGERY CENTER;  Service: General;;   PORTACATH PLACEMENT Right 04/05/2016   Procedure: INSERTION PORT-A-CATH WITH [place];  Surgeon: [name], MD;  Location: [facility] OR;  Service: General;  Laterality: Right;  right IJ   TOTAL THYROIDECTOMY  2001   TUBAL LIGATION  94   UPPER GI ENDOSCOPY      Prior to Admission medications   Medication Sig Start Date End Date Taking? Authorizing Provider  apixaban (ELIQUIS) 5 MG TABS tablet Take 1 tablet (5 mg total) by mouth 2 (two) times daily. 12/22/23  Yes  Gottschalk, Kathie Rhodes M, DO  carvedilol (COREG) 3.125 MG tablet Take 1 tablet (3.125 mg total) by mouth 2 (two) times daily. NEEDS FOLLOW UP APPOINTMENT FOR ANYMORE REFILLS 04/26/23  Yes Meriam Sprague, MD  chlorpheniramine (CHLOR-TRIMETON) 4 MG tablet Take 1 tablet (4 mg total) by mouth every 4 (four) hours as needed for allergies. 11/27/23  Yes Tiffany Kocher, PA-C  eplerenone (INSPRA) 25 MG tablet TAKE 1 TABLET BY MOUTH ONCE DAILY. 01/01/24  Yes [name], MD  famotidine (PEPCID) 20 MG tablet One after supper 11/27/23  Yes Tiffany Kocher, PA-C  fluticasone (FLONASE) 50 MCG/ACT nasal spray Place 2 sprays into both nostrils daily. 11/01/23  Yes Rakes, Doralee Albino, FNP  levothyroxine (SYNTHROID) 75 MCG tablet Take 1 tablet (75 mcg total) by mouth daily before breakfast. 12/22/23  Yes Gottschalk, Kathie Rhodes M, DO  linaclotide (LINZESS) 290 MCG CAPS capsule Take one daily starting one week before your colonoscopy 11/27/23  Yes Tiffany Kocher, PA-C  pantoprazole (PROTONIX) 40 MG tablet Take 1 tablet (40 mg total) by mouth daily before breakfast. Take 30-60 min before first meal of the day 11/27/23  Yes Tiffany Kocher, PA-C  valsartan (DIOVAN) 80 MG tablet Take 1 tablet (80 mg total) by mouth 2 (two) times daily. 03/27/23  Yes Nyoka Cowden, MD  acetaminophen (TYLENOL) 500 MG tablet Take 500 mg by mouth every 6 (six) hours as needed for mild pain or moderate pain.    [provider]  rosuvastatin (CRESTOR) 10 MG tablet Take 1 tablet (10 mg total) by mouth daily. 08/30/23 11/28/23  [name], MD    Allergies as of 12/27/2023 - Review Complete 12/22/2023  Allergen Reaction Noted   Avelox [moxifloxacin hcl in nacl] Shortness Of Breath 09/07/2012   Clindamycin/lincomycin Shortness Of Breath and Diarrhea 12/09/2014   Kiwi extract Anaphylaxis 09/07/2012   Bactrim [sulfamethoxazole-trimethoprim] Swelling 11/06/2014   Levofloxacin Other (See Comments) 10/21/2015   Simvastatin Other (See Comments) 07/10/2019    Spironolactone Other (See Comments) 10/21/2015   Moxifloxacin hcl Other (See Comments) 07/19/2022   Tramadol Nausea Only 05/17/2022    Family History  Problem Relation Age of Onset   Heart attack Mother    Cancer Mother        breast   Breast cancer Mother 24   Hypertension Sister    Hypertension Brother    Cancer Paternal Grandmother        possible cancer, unknown type   Cancer Maternal Aunt        breast   Breast cancer Maternal Aunt    Stroke Neg Hx    Diabetes Neg Hx    Colon cancer Neg Hx     Social History   Socioeconomic History   Marital status: Widowed    Spouse name: Not on file   Number of children: Not on file   Years of education: Not on file   Highest education level: Some college, no degree  Occupational History   Occupation: Widow  Tobacco Use   Smoking status: Never  Smokeless tobacco: Never  Vaping Use   Vaping status: Never Used  Substance and Sexual Activity   Alcohol use: No   Drug use: No   Sexual activity: Not Currently    Birth control/protection: Surgical    Comment: tubal  Other Topics Concern   Not on file  Social History Narrative   Not on file   Social Drivers of Health   Financial Resource Strain: Medium Risk (10/06/2023)   Overall Financial Resource Strain (CARDIA)    Difficulty of Paying Living Expenses: Somewhat hard  Food Insecurity: Food Insecurity Present (10/06/2023)   Hunger Vital Sign    Worried About Running Out of Food in the Last Year: Sometimes true    Ran Out of Food in the Last Year: Never true  Transportation Needs: No Transportation Needs (10/06/2023)   PRAPARE - Administrator, Civil Service (Medical): No    Lack of Transportation (Non-Medical): No  Physical Activity: Unknown (10/06/2023)   Exercise Vital Sign    Days of Exercise per Week: 0 days    Minutes of Exercise per Session: Not on file  Stress: No Stress Concern Present (10/06/2023)   [employer] of Occupational Health -  Occupational Stress Questionnaire    Feeling of Stress : Only a little  Social Connections: Moderately Integrated (10/06/2023)   Social Connection and Isolation Panel [NHANES]    Frequency of Communication with Friends and Family: More than three times a week    Frequency of Social Gatherings with Friends and Family: Twice a week    Attends Religious Services: More than 4 times per year    Active Member of [employer] or Organizations: Yes    Attends Banker Meetings: More than 4 times per year    Marital Status: Widowed  Intimate Partner Violence: Unknown (01/25/2022)   [facility]   HITS    Physically Hurt: Not on file    Insult or Talk Down To: Not on file    Threaten Physical Harm: Not on file    Scream or Curse: Not on file    Review of Systems: See HPI, otherwise negative ROS  Physical Exam: Vital signs in last 24 hours: Temp:  [98.3 F (36.8 C)] 98.3 F (36.8 C) (03/28 0915) Resp:  [14] 14 (03/28 0915) BP: (133)/(82) 133/82 (03/28 0915) SpO2:  [100 %] 100 % (03/28 0915)   General:   Alert,  Well-developed, well-nourished, pleasant and cooperative in NAD Head:  Normocephalic and atraumatic. Eyes:  Sclera clear, no icterus.   Conjunctiva pink. Ears:  Normal auditory acuity. Nose:  No deformity, discharge,  or lesions. Msk:  Symmetrical without gross deformities. Normal posture. Extremities:  Without clubbing or edema. Neurologic:  Alert and  oriented x4;  grossly normal neurologically. Skin:  Intact without significant lesions or rashes. Psych:  Alert and cooperative. Normal mood and affect.  Impression/Plan: [name] [name] is here for an EGD for GERD and a colonoscopy to be performed for surveillance purposes, personal history of adenomatous colon polyps in 2020  The risks of the procedure including infection, bleed, or perforation as well as benefits, limitations, alternatives and imponderables have been reviewed with the patient.  Questions have been answered. All parties agreeable.

## 2024-01-19 NOTE — Anesthesia Preprocedure Evaluation (Signed)
 Anesthesia Evaluation  Patient identified by MRN, date of birth, ID band Patient awake    Reviewed: Allergy & Precautions, H&P , NPO status , Patient's Chart, lab work & pertinent test results, reviewed documented beta blocker date and time   Airway Mallampati: II  TM Distance: >3 FB Neck ROM: full    Dental no notable dental hx.    Pulmonary neg pulmonary ROS, pneumonia   Pulmonary exam normal breath sounds clear to auscultation       Cardiovascular Exercise Tolerance: Good hypertension, +CHF and + DOE  negative cardio ROS + Cardiac Defibrillator  Rhythm:regular Rate:Normal     Neuro/Psych TIAnegative neurological ROS  negative psych ROS   GI/Hepatic negative GI ROS, Neg liver ROS,GERD  ,,  Endo/Other  negative endocrine ROSHypothyroidism    Renal/GU negative Renal ROS  negative genitourinary   Musculoskeletal   Abdominal   Peds  Hematology negative hematology ROS (+) Blood dyscrasia, anemia   Anesthesia Other Findings   Reproductive/Obstetrics negative OB ROS                             Anesthesia Physical Anesthesia Plan  ASA: 3  Anesthesia Plan: General   Post-op Pain Management:    Induction:   PONV Risk Score and Plan: Propofol infusion  Airway Management Planned:   Additional Equipment:   Intra-op Plan:   Post-operative Plan:   Informed Consent: I have reviewed the patients History and Physical, chart, labs and discussed the procedure including the risks, benefits and alternatives for the proposed anesthesia with the patient or authorized representative who has indicated his/her understanding and acceptance.     Dental Advisory Given  Plan Discussed with: CRNA  Anesthesia Plan Comments:        Anesthesia Quick Evaluation

## 2024-01-19 NOTE — Transfer of Care (Signed)
 Immediate Anesthesia Transfer of Care Note  Patient: [name] [name]  Procedure(s) Performed: COLONOSCOPY EGD (ESOPHAGOGASTRODUODENOSCOPY) POLYPECTOMY  Patient Location: Short Stay  Anesthesia Type:General  Level of Consciousness: awake, alert , and oriented  Airway & Oxygen Therapy: Patient Spontanous Breathing  Post-op Assessment: Report given to RN, Post -op Vital signs reviewed and stable, Patient moving all extremities X 4, and Patient able to stick tongue midline  Post vital signs: Reviewed and stable  Last Vitals:  Vitals Value Taken Time  BP 121/63   Temp 97.7   Pulse 79   Resp 20   SpO2 100     Last Pain:  Vitals:   01/19/24 0937  PainSc: 0-No pain         Complications: No notable events documented.

## 2024-01-19 NOTE — Discharge Instructions (Addendum)
 EGD Discharge instructions Please read the instructions outlined below and refer to this sheet in the next few weeks. These discharge instructions provide you with general information on caring for yourself after you leave the hospital. Your doctor may also give you specific instructions. While your treatment has been planned according to the most current medical practices available, unavoidable complications occasionally occur. If you have any problems or questions after discharge, please call your doctor. ACTIVITY You may resume your regular activity but move at a slower pace for the next 24 hours.  Take frequent rest periods for the next 24 hours.  Walking will help expel (get rid of) the air and reduce the bloated feeling in your abdomen.  No driving for 24 hours (because of the anesthesia (medicine) used during the test).  You may shower.  Do not sign any important legal documents or operate any machinery for 24 hours (because of the anesthesia used during the test).  NUTRITION Drink plenty of fluids.  You may resume your normal diet.  Begin with a light meal and progress to your normal diet.  Avoid alcoholic beverages for 24 hours or as instructed by your caregiver.  MEDICATIONS You may resume your normal medications unless your caregiver tells you otherwise.  WHAT YOU CAN EXPECT TODAY You may experience abdominal discomfort such as a feeling of fullness or "gas" pains.  FOLLOW-UP Your doctor will discuss the results of your test with you.  SEEK IMMEDIATE MEDICAL ATTENTION IF ANY OF THE FOLLOWING OCCUR: Excessive nausea (feeling sick to your stomach) and/or vomiting.  Severe abdominal pain and distention (swelling).  Trouble swallowing.  Temperature over 101 F (37.8 C).  Rectal bleeding or vomiting of blood.      Colonoscopy Discharge Instructions  Read the instructions outlined below and refer to this sheet in the next few weeks. These discharge instructions provide you  with general information on caring for yourself after you leave the hospital. Your doctor may also give you specific instructions. While your treatment has been planned according to the most current medical practices available, unavoidable complications occasionally occur.   ACTIVITY You may resume your regular activity, but move at a slower pace for the next 24 hours.  Take frequent rest periods for the next 24 hours.  Walking will help get rid of the air and reduce the bloated feeling in your belly (abdomen).  No driving for 24 hours (because of the medicine (anesthesia) used during the test).   Do not sign any important legal documents or operate any machinery for 24 hours (because of the anesthesia used during the test).  NUTRITION Drink plenty of fluids.  You may resume your normal diet as instructed by your doctor.  Begin with a light meal and progress to your normal diet. Heavy or fried foods are harder to digest and may make you feel sick to your stomach (nauseated).  Avoid alcoholic beverages for 24 hours or as instructed.  MEDICATIONS You may resume your normal medications unless your doctor tells you otherwise.  WHAT YOU CAN EXPECT TODAY Some feelings of bloating in the abdomen.  Passage of more gas than usual.  Spotting of blood in your stool or on the toilet paper.  IF YOU HAD POLYPS REMOVED DURING THE COLONOSCOPY: No aspirin products for 7 days or as instructed.  No alcohol for 7 days or as instructed.  Eat a soft diet for the next 24 hours.  FINDING OUT THE RESULTS OF YOUR TEST Not all test results  are available during your visit. If your test results are not back during the visit, make an appointment with your caregiver to find out the results. Do not assume everything is normal if you have not heard from your caregiver or the medical facility. It is important for you to follow up on all of your test results.  SEEK IMMEDIATE MEDICAL ATTENTION IF: You have more than a  spotting of blood in your stool.  Your belly is swollen (abdominal distention).  You are nauseated or vomiting.  You have a temperature over 101.  You have abdominal pain or discomfort that is severe or gets worse throughout the day.   Your EGD revealed mild amount inflammation in your stomach.  I took biopsies of this to rule out infection with a bacteria called H. pylori.  I also took samples of your esophagus. Small bowel appeared normal.   Continue on pantoprazole daily   Your colonoscopy revealed 2 polyp(s) which I removed successfully. Await pathology results, my office will contact you. I recommend repeating colonoscopy in 5 years for surveillance purposes.   Follow up in GI office in 8 weeks.    I hope you have a great rest of your week!  Hennie Duos. Marletta Lor, D.O. Gastroenterology and Hepatology [facility]

## 2024-01-19 NOTE — Op Note (Signed)
 [facility] Patient Name: [name] [name] Procedure Date: 01/19/2024 9:32 AM MRN: [id number] Date of Birth: 20-Apr-1961 Attending MD: Hennie Duos. Marletta Lor , [place], [id number] CSN: [id number] Age: 63 Admit Type: Outpatient Procedure:                Colonoscopy Indications:              Surveillance: Personal history of colonic polyps                            (unknown histology) on last colonoscopy 5 years ago Providers:                Hennie Duos. Marletta Lor, DO, Buel Ream. Thomasena Edis RN, RN,                            Pandora Leiter, Technician Referring MD:              Medicines:                See the Anesthesia note for documentation of the                            administered medications Complications:            No immediate complications. Estimated Blood Loss:     Estimated blood loss was minimal. Procedure:                Pre-Anesthesia Assessment:                           - The anesthesia plan was to use monitored                            anesthesia care (MAC).                           After obtaining informed consent, the colonoscope                            was passed under direct vision. Throughout the                            procedure, the patient's blood pressure, pulse, and                            oxygen saturations were monitored continuously. The                            [id number]-HQ190L ([id number]) scope was introduced through                            the anus and advanced to the the cecum, identified                            by appendiceal orifice and ileocecal valve. The  colonoscopy was performed without difficulty. The                            patient tolerated the procedure well. The quality                            of the bowel preparation was evaluated using the                            BBPS [facility] Bowel Preparation Scale) with scores                            of: Right Colon = 3, Transverse Colon = 3 and Left                             Colon = 3 (entire mucosa seen well with no residual                            staining, small fragments of stool or opaque                            liquid). The total BBPS score equals 9. Findings:      Two sessile polyps were found in the ascending colon and cecum. The       polyps were 4 to 5 mm in size. These polyps were removed with a cold       snare. Resection and retrieval were complete.      Non-bleeding internal hemorrhoids were found. The hemorrhoids were small.      The exam was otherwise without abnormality. Impression:               - Two 4 to 5 mm polyps in the ascending colon and                            in the cecum, removed with a cold snare. Resected                            and retrieved.                           - Non-bleeding internal hemorrhoids.                           - The examination was otherwise normal. Moderate Sedation:      Per Anesthesia Care Recommendation:           - Patient has a contact number available for                            emergencies. The signs and symptoms of potential                            delayed complications were discussed with the  patient. Return to normal activities tomorrow.                            Written discharge instructions were provided to the                            patient.                           - Resume previous diet.                           - Continue present medications.                           - Await pathology results.                           - Repeat colonoscopy in 5 years for surveillance.                           - Return to GI clinic in 8 weeks. Procedure Code(s):        --- Professional ---                           [phone], Colonoscopy, flexible; with removal of                            tumor(s), polyp(s), or other lesion(s) by snare                            technique Diagnosis Code(s):        --- Professional ---                            Z86.010, Personal history of colonic polyps                           D12.2, Benign neoplasm of ascending colon                           D12.0, Benign neoplasm of cecum                           K64.8, Other hemorrhoids CPT copyright 2022 American Medical Association. All rights reserved. The codes documented in this report are preliminary and upon coder review may  be revised to meet current compliance requirements. Hennie Duos. Marletta Lor, DO Hennie Duos. Marletta Lor, DO 01/19/2024 10:12:08 AM This report has been signed electronically. Number of Addenda: 0

## 2024-01-19 NOTE — Op Note (Addendum)
 [facility] Patient Name: [name] [name] Procedure Date: 01/19/2024 9:32 AM MRN: [id number] Date of Birth: June 21, 1961 Attending MD: Hennie Duos. Marletta Lor , [place], [id number] CSN: [id number] Age: 63 Admit Type: Outpatient Procedure:                Upper GI endoscopy Indications:              Heartburn Providers:                Hennie Duos. Marletta Lor, DO, Buel Ream. Thomasena Edis RN, RN,                            Pandora Leiter, Technician Referring MD:              Medicines:                See the Anesthesia note for documentation of the                            administered medications Complications:            No immediate complications. Estimated Blood Loss:     Estimated blood loss was minimal. Procedure:                Pre-Anesthesia Assessment:                           - The anesthesia plan was to use monitored                            anesthesia care (MAC).                           After obtaining informed consent, the endoscope was                            passed under direct vision. Throughout the                            procedure, the patient's blood pressure, pulse, and                            oxygen saturations were monitored continuously. The                            GIF-H190 ([id number]) scope was introduced through the                            mouth, and advanced to the second part of duodenum.                            The upper GI endoscopy was accomplished without                            difficulty. The patient tolerated the procedure                            well. Scope In: 9:52:48  AM Scope Out: 10:08:31 AM Scope Withdrawal Time: 0 hours 11 minutes 12 seconds  Total Procedure Duration: 0 hours 15 minutes 43 seconds  Findings:      The Z-line was variable. Biopsies were taken with a cold forceps for       histology.      Patchy mild inflammation characterized by erythema was found in the       gastric body. Biopsies were taken with a cold forceps for  Helicobacter       pylori testing.      The duodenal bulb, first portion of the duodenum and second portion of       the duodenum were normal. Impression:               - Z-line variable. Biopsied.                           - Gastritis. Biopsied.                           - Normal duodenal bulb, first portion of the                            duodenum and second portion of the duodenum. Moderate Sedation:      Per Anesthesia Care Recommendation:           - Patient has a contact number available for                            emergencies. The signs and symptoms of potential                            delayed complications were discussed with the                            patient. Return to normal activities tomorrow.                            Written discharge instructions were provided to the                            patient.                           - Resume previous diet.                           - Continue present medications.                           - Await pathology results.                           - Return to GI clinic in 8 weeks.                           - Use Protonix (pantoprazole) 40 mg PO daily. Procedure Code(s):        --- Professional ---  32440, Esophagogastroduodenoscopy, flexible,                            transoral; with biopsy, single or multiple Diagnosis Code(s):        --- Professional ---                           K22.89, Other specified disease of esophagus                           K29.70, Gastritis, unspecified, without bleeding                           R12, Heartburn CPT copyright 2022 American [employer] Association. All rights reserved. The codes documented in this report are preliminary and upon coder review may  be revised to meet current compliance requirements. Hennie Duos. Marletta Lor, DO Hennie Duos. Marletta Lor, DO 01/19/2024 9:49:43 AM This report has been signed electronically. Number of Addenda: 0

## 2024-01-22 ENCOUNTER — Encounter (HOSPITAL_COMMUNITY): Payer: Self-pay | Admitting: Internal Medicine

## 2024-01-22 LAB — SURGICAL PATHOLOGY

## 2024-01-24 ENCOUNTER — Encounter: Payer: Self-pay | Admitting: *Deleted

## 2024-01-29 ENCOUNTER — Encounter: Payer: Self-pay | Admitting: Family Medicine

## 2024-02-01 ENCOUNTER — Ambulatory Visit
Admission: RE | Admit: 2024-02-01 | Discharge: 2024-02-01 | Disposition: A | Discharge status: Home / Self Care | Source: Ambulatory Visit | Attending: Family Medicine | Admitting: Family Medicine

## 2024-02-01 ENCOUNTER — Other Ambulatory Visit: Payer: Self-pay

## 2024-02-01 ENCOUNTER — Ambulatory Visit (INDEPENDENT_AMBULATORY_CARE_PROVIDER_SITE_OTHER)

## 2024-02-01 VITALS — BP 135/84 | HR 64 | Temp 98.3°F | Resp 20

## 2024-02-01 DIAGNOSIS — M25562 Pain in left knee: Secondary | ICD-10-CM

## 2024-02-01 DIAGNOSIS — M85862 Other specified disorders of bone density and structure, left lower leg: Secondary | ICD-10-CM | POA: Diagnosis not present

## 2024-02-01 MED ORDER — DEXAMETHASONE SODIUM PHOSPHATE 10 MG/ML IJ SOLN
10.0000 mg | Freq: Once | INTRAMUSCULAR | Status: AC
  Administered 2024-02-01: 10 mg via INTRAMUSCULAR

## 2024-02-01 MED ORDER — DICLOFENAC SODIUM 1 % EX GEL: 4.0000 g | Freq: Four times a day (QID) | CUTANEOUS | 1 refills | Status: DC | PRN

## 2024-02-01 NOTE — ED Triage Notes (Signed)
 Pt reports left knee pain since Tuesday. Pt denies any known fall. Reports increase in pain and swelling since last night. Does not use cane at baseline but reports has needed it since flare-up.

## 2024-02-01 NOTE — Discharge Instructions (Signed)
 We have given you a shot today to help with pain and inflammation and have applied an Ace wrap that you may reapply daily as needed.  You may ice, elevate, and avoid exertional activity until feeling better.  We will let you know if anything comes back abnormal on the knee x-ray.  I have also sent in an anti-inflammatory topical medication that you may use as needed.

## 2024-02-01 NOTE — ED Provider Notes (Signed)
 [facility]    CSN: [id number] Arrival date & time: 02/01/24  0912      History   Chief Complaint Chief Complaint  Patient presents with   Knee Pain    Entered by patient    HPI [name] [name] is a 63 y.o. female.   Patient presenting today with 1 day history of left knee pain.  States the pain is sharp and worse with weightbearing.  Denies swelling, bruising, redness, numbness, tingling, loss of range of motion.  No direct injury, states she was walking up a ramp and it started hurting.  So far trying over-the-counter pain relievers with minimal relief.    Past Medical History:  Diagnosis Date   A-fib [facility])    Acute deep vein thrombosis (DVT) of right popliteal vein (HCC) 04/13/2022   Acute pulmonary embolism (HCC) 04/12/2022   AICD (automatic cardioverter/defibrillator) present 2009   [employer]- replaced 2016   Arthritis    Bradycardia    Breast cancer (HCC) 02/24/2016   s/p lumpectomy and radiation but refused chemotherapy   Chronic systolic CHF (congestive heart failure) (HCC)    Cough due to angiotensin-converting enzyme inhibitor    Deep venous thrombosis of lower extremity (HCC) 05/24/2023   Dizziness    Dizziness with standing after squatting, March, 2012   DVT (deep venous thrombosis) (HCC)    Dyslipidemia    LDL elevation,Patient does not want statin   Family history of breast cancer    GERD (gastroesophageal reflux disease)    H/O shortness of breath    CPX 10/18:  No evidence of cardiopulmonary limitation. Suspect exercise intolerance related to weight and deconditioning.    Hypothyroidism    ICD (implantable cardiac defibrillator) battery depletion    Dr. Graciela Husbands, June, 2009(artifactual atrial tachycardia response events addressed by reprogramming in parentheses   Leg swelling    right leg   Mitral regurgitation    Nonischemic cardiomyopathy (HCC)    Etiology unknown, diagnosed 2008   Paroxysmal atrial fibrillation (HCC) 06/20/2022    PE (pulmonary thromboembolism) (HCC)    Personal history of radiation therapy 2017   Pneumonia due to COVID-19 virus 10/2019   Pulmonary embolism (HCC) 04/21/2020   TIA (transient ischemic attack)    No CT or MRI abnormality, aspirin therapy   TIA (transient ischemic attack) 2007    Patient Active Problem List   Diagnosis Date Noted   IBS (irritable colon syndrome) 11/27/2023   H/O adenomatous polyp of colon 11/27/2023   Laryngopharyngeal reflux (LPR) 05/31/2023   Tinnitus of both ears 05/31/2023   Upper airway cough syndrome 11/28/2022   DOE (dyspnea on exertion) 11/28/2022   Paroxysmal atrial fibrillation (HCC) 06/20/2022   Urge incontinence of urine 05/17/2022   Obesity (BMI 30-39.9) 04/13/2022   Neuropathy 05/19/2021   Vestibulitis, vulvar 05/10/2021   Atrophic vaginitis 05/10/2021   Hypertensive disorder 05/10/2021   History of malignant neoplasm of breast 05/10/2021   Osteopenia 04/14/2021   NICM (nonischemic cardiomyopathy) (HCC) 07/23/2020   Post-surgical hypothyroidism 03/27/2020   Anemia 07/15/2019   Lumbar spondylosis with myelopathy 04/19/2019   Genetic testing 11/22/2016   Breast cancer- radiation June 2016 08/05/2016   Cardiac LV ejection fraction 30-35% 07/18/2016   Heart disease 04/22/2016   Breast cancer of upper-inner quadrant of left female breast (HCC) 03/09/2016   Chronic combined systolic and diastolic HF (heart failure) (HCC) 02/26/2014   Ejection fraction < 50%    Itching    Hyperlipidemia    Leg swelling  Dual implantable cardioverter-defibrillator in situ    FOOT PAIN 01/20/2010   History of TIA 2007 01/19/2010    Past Surgical History:  Procedure Laterality Date   BREAST BIOPSY Left 02/24/2016   U/S Core   BREAST BIOPSY Left 02/24/2016   U/S Core   BREAST LUMPECTOMY Left 04/05/2016   invasive ductal    BREAST LUMPECTOMY WITH RADIOACTIVE SEED AND SENTINEL LYMPH NODE BIOPSY Left 04/05/2016   Procedure: BREAST LUMPECTOMY WITH  RADIOACTIVE SEED AND SENTINEL LYMPH NODE BIOPSY;  Surgeon: [name], MD;  Location: [facility] OR;  Service: General;  Laterality: Left;   CARDIAC CATHETERIZATION N/A 08/05/2016   Procedure: Left Heart Cath and Coronary Angiography;  Surgeon: Peter M Swaziland, MD;  Location: [facility] INVASIVE CV LAB;  Service: Cardiovascular;  Laterality: N/A;   CARDIAC DEFIBRILLATOR PLACEMENT  09   [employer] no remote   COLONOSCOPY     COLONOSCOPY N/A 01/19/2024   Procedure: COLONOSCOPY;  Surgeon: Lanelle Bal, DO;  Location: [facility];  Service: Endoscopy;  Laterality: N/A;  11:00 am, asa 3/4   CYST EXCISION Left    back of leg-lt    ESOPHAGOGASTRODUODENOSCOPY N/A 01/19/2024   Procedure: EGD (ESOPHAGOGASTRODUODENOSCOPY);  Surgeon: Lanelle Bal, DO;  Location: [facility];  Service: Endoscopy;  Laterality: N/A;   IMPLANTABLE CARDIOVERTER DEFIBRILLATOR GENERATOR CHANGE N/A 12/29/2014   Procedure: IMPLANTABLE CARDIOVERTER DEFIBRILLATOR GENERATOR CHANGE;  Surgeon: [name], MD;  Location: [facility] CATH LAB;  Service: Cardiovascular;  Laterality: N/A;   LATERAL EPICONDYLE RELEASE  10/11/2012   Procedure: TENNIS ELBOW RELEASE;  Surgeon: Loreta Ave, MD;  Location: [facility];  Service: Orthopedics;  Laterality: Right;  RIGHT ELBOW: TENOTOMY ELBOW LATERAL EPICONDYLITIS TENNIS ELBOW: RADIAL TUNNEL RELEASE   POLYPECTOMY  01/19/2024   Procedure: POLYPECTOMY;  Surgeon: Lanelle Bal, DO;  Location: [facility];  Service: Endoscopy;;   PORT-A-CATH REMOVAL  04/13/2016   Procedure: MINOR REMOVAL PORT-A-CATH;  Surgeon: Manus Rudd, MD;  Location: [facility];  Service: General;;   PORTACATH PLACEMENT Right 04/05/2016   Procedure: INSERTION PORT-A-CATH WITH [place];  Surgeon: [name], MD;  Location: [facility] OR;  Service: General;  Laterality: Right;  right IJ   TOTAL THYROIDECTOMY  2001   TUBAL LIGATION  94   UPPER GI ENDOSCOPY      OB History     Gravida  2   Para       Term      Preterm      AB      Living  2      SAB      IAB      Ectopic      Multiple      Live Births               Home Medications    Prior to Admission medications   Medication Sig Start Date End Date Taking? Authorizing Provider  diclofenac Sodium (VOLTAREN ARTHRITIS PAIN) 1 % GEL Apply 4 g topically 4 (four) times daily as needed. 02/01/24  Yes Particia Nearing, PA-C  acetaminophen (TYLENOL) 500 MG tablet Take 500 mg by mouth every 6 (six) hours as needed for mild pain or moderate pain.    [provider]  apixaban (ELIQUIS) 5 MG TABS tablet Take 1 tablet (5 mg total) by mouth 2 (two) times daily. 12/22/23   [name], DO  carvedilol (COREG) 3.125 MG tablet Take 1 tablet (3.125 mg total) by  mouth 2 (two) times daily. NEEDS FOLLOW UP APPOINTMENT FOR ANYMORE REFILLS 04/26/23   Meriam Sprague, MD  chlorpheniramine (CHLOR-TRIMETON) 4 MG tablet Take 1 tablet (4 mg total) by mouth every 4 (four) hours as needed for allergies. 11/27/23   Tiffany Kocher, PA-C  eplerenone (INSPRA) 25 MG tablet TAKE 1 TABLET BY MOUTH ONCE DAILY. 01/01/24   [name], MD  famotidine (PEPCID) 20 MG tablet One after supper 11/27/23   Tiffany Kocher, PA-C  fluticasone [facility]) 50 MCG/ACT nasal spray Place 2 sprays into both nostrils daily. 11/01/23   Sonny Masters, FNP  levothyroxine (SYNTHROID) 75 MCG tablet Take 1 tablet (75 mcg total) by mouth daily before breakfast. 12/22/23   [name], DO  linaclotide [facility]) 290 MCG CAPS capsule Take one daily starting one week before your colonoscopy 11/27/23   Tiffany Kocher, PA-C  pantoprazole (PROTONIX) 40 MG tablet Take 1 tablet (40 mg total) by mouth daily before breakfast. Take 30-60 min before first meal of the day 11/27/23   Tiffany Kocher, PA-C  rosuvastatin (CRESTOR) 10 MG tablet Take 1 tablet (10 mg total) by mouth daily. 08/30/23 11/28/23  [name], MD  valsartan (DIOVAN) 80 MG tablet Take 1 tablet (80 mg  total) by mouth 2 (two) times daily. 03/27/23   Nyoka Cowden, MD    Family History Family History  Problem Relation Age of Onset   Heart attack Mother    Cancer Mother        breast   Breast cancer Mother 25   Hypertension Sister    Hypertension Brother    Cancer Paternal Grandmother        possible cancer, unknown type   Cancer Maternal Aunt        breast   Breast cancer Maternal Aunt    Stroke Neg Hx    Diabetes Neg Hx    Colon cancer Neg Hx     Social History Social History   Tobacco Use   Smoking status: Never   Smokeless tobacco: Never  Vaping Use   Vaping status: Never Used  Substance Use Topics   Alcohol use: No   Drug use: No     Allergies   Avelox [moxifloxacin hcl in nacl], Clindamycin/lincomycin, Kiwi extract, Bactrim [sulfamethoxazole-trimethoprim], Levofloxacin, Simvastatin, Spironolactone, Moxifloxacin hcl, and Tramadol   Review of Systems Review of Systems Per HPI  Physical Exam Triage Vital Signs ED Triage Vitals  Encounter Vitals Group     BP 02/01/24 0937 135/84     Systolic BP Percentile --      Diastolic BP Percentile --      Pulse Rate 02/01/24 0937 64     Resp 02/01/24 0937 20     Temp 02/01/24 0937 98.3 F (36.8 C)     Temp Source 02/01/24 0937 Oral     SpO2 02/01/24 0937 93 %     Weight --      Height --      Head Circumference --      Peak Flow --      Pain Score 02/01/24 0939 10     Pain Loc --      Pain Education --      Exclude from Growth Chart --    No data found.  Updated Vital Signs BP 135/84 (BP Location: Right Arm)   Pulse 64   Temp 98.3 F (36.8 C) (Oral)   Resp 20   SpO2 93%  Visual Acuity Right Eye Distance:   Left Eye Distance:   Bilateral Distance:    Right Eye Near:   Left Eye Near:    Bilateral Near:     Physical Exam Vitals and nursing note reviewed.  Constitutional:      Appearance: Normal appearance. She is not ill-appearing.  HENT:     Head: Atraumatic.  Eyes:     Extraocular  Movements: Extraocular movements intact.     Conjunctiva/sclera: Conjunctivae normal.  Cardiovascular:     Rate and Rhythm: Normal rate.  Pulmonary:     Effort: Pulmonary effort is normal.  Musculoskeletal:        General: Normal range of motion.     Cervical back: Normal range of motion and neck supple.  Skin:    General: Skin is warm and dry.  Neurological:     Mental Status: She is alert and oriented to person, place, and time.  Psychiatric:        Mood and Affect: Mood normal.        Thought Content: Thought content normal.        Judgment: Judgment normal.      UC Treatments / Results  Labs (all labs ordered are listed, but only abnormal results are displayed) Labs Reviewed - No data to display  EKG   Radiology DG Knee Complete 4 Views Left Result Date: 02/01/2024 CLINICAL DATA:  Left medial knee pain for a day.  No injury EXAM: LEFT KNEE - COMPLETE 4 VIEW COMPARISON:  None Available. FINDINGS: No fracture or dislocation. Preserved joint spaces and bone mineralization. Hyperostosis about the patella. IMPRESSION: No acute osseous abnormality. Electronically Signed   By: Karen Kays M.D.   On: 02/01/2024 11:32    Procedures Procedures (including critical care time)  Medications Ordered in UC Medications  dexamethasone (DECADRON) injection 10 mg (10 mg Intramuscular Given 02/01/24 1022)    Initial Impression / Assessment and Plan / UC Course  I have reviewed the triage vital signs and the nursing notes.  Pertinent labs & imaging results that were available during my care of the patient were reviewed by me and considered in my medical decision making (see chart for details).     X-ray of the left knee negative for acute bony normality.  Treat with IM Decadron, Voltaren gel, Ace wrap, RICE protocol.  Discussed supportive home care and return precautions.  Final Clinical Impressions(s) / UC Diagnoses   Final diagnoses:  Acute pain of left knee     Discharge  Instructions      We have given you a shot today to help with pain and inflammation and have applied an Ace wrap that you may reapply daily as needed.  You may ice, elevate, and avoid exertional activity until feeling better.  We will let you know if anything comes back abnormal on the knee x-ray.  I have also sent in an anti-inflammatory topical medication that you may use as needed.    ED Prescriptions     Medication Sig Dispense Auth. Provider   diclofenac Sodium (VOLTAREN ARTHRITIS PAIN) 1 % GEL Apply 4 g topically 4 (four) times daily as needed. 150 g Particia Nearing, [place]      PDMP not reviewed this encounter.   Particia Nearing, [place] 02/01/24 1818

## 2024-02-05 DIAGNOSIS — M25562 Pain in left knee: Secondary | ICD-10-CM | POA: Diagnosis not present

## 2024-02-07 ENCOUNTER — Encounter: Payer: Self-pay | Admitting: Gastroenterology

## 2024-02-08 ENCOUNTER — Ambulatory Visit (INDEPENDENT_AMBULATORY_CARE_PROVIDER_SITE_OTHER): Payer: 59

## 2024-02-08 DIAGNOSIS — I428 Other cardiomyopathies: Secondary | ICD-10-CM | POA: Diagnosis not present

## 2024-02-10 LAB — CUP PACEART REMOTE DEVICE CHECK
Battery Remaining Longevity: 42 mo
Battery Remaining Percentage: 48 %
Brady Statistic RA Percent Paced: 0 %
Brady Statistic RV Percent Paced: 0 %
Date Time Interrogation Session: 20250417000200
HighPow Impedance: 50 Ohm
Implantable Lead Connection Status: 753985
Implantable Lead Connection Status: 753985
Implantable Lead Implant Date: 20090320
Implantable Lead Implant Date: 20090320
Implantable Lead Location: 753859
Implantable Lead Location: 753860
Implantable Lead Model: 158
Implantable Lead Model: 5076
Implantable Lead Serial Number: 182504
Implantable Pulse Generator Implant Date: 20160307
Lead Channel Impedance Value: 436 Ohm
Lead Channel Impedance Value: 486 Ohm
Lead Channel Pacing Threshold Amplitude: 0.4 V
Lead Channel Pacing Threshold Amplitude: 1.1 V
Lead Channel Pacing Threshold Pulse Width: 0.4 ms
Lead Channel Pacing Threshold Pulse Width: 0.4 ms
Lead Channel Setting Pacing Amplitude: 2 V
Lead Channel Setting Pacing Amplitude: 2.5 V
Lead Channel Setting Pacing Pulse Width: 0.4 ms
Lead Channel Setting Sensing Sensitivity: 0.6 mV
Pulse Gen Serial Number: 111826

## 2024-02-14 NOTE — Progress Notes (Unsigned)
 Subjective: CC:*** PCP: [name] Guerin, DO ZOX:[id number] M [name] is a 63 y.o. female presenting to clinic today for:  1. ***   ROS: Per HPI  Allergies  Allergen Reactions   Avelox [Moxifloxacin Hcl In Nacl] Shortness Of Breath    Shortness of breath   Clindamycin/Lincomycin Shortness Of Breath and Diarrhea    Breathing/diarrhea     Kiwi Extract Anaphylaxis    Makes pt. Feel like her throat is closing    Bactrim [Sulfamethoxazole-Trimethoprim] Swelling   Levofloxacin Other (See Comments)    JOINT PAIN JOINT PAIN   Simvastatin Other (See Comments)    Joint pain   Spironolactone  Other (See Comments)    burning   Moxifloxacin Hcl Other (See Comments)   Tramadol  Nausea Only   Past Medical History:  Diagnosis Date   A-fib (HCC)    Acute deep vein thrombosis (DVT) of right popliteal vein (HCC) 04/13/2022   Acute pulmonary embolism (HCC) 04/12/2022   AICD (automatic cardioverter/defibrillator) present 2009   [employer]- replaced 2016   Arthritis    Bradycardia    Breast cancer (HCC) 02/24/2016   s/p lumpectomy and radiation but refused chemotherapy   Chronic systolic CHF (congestive heart failure) (HCC)    Cough due to angiotensin-converting enzyme inhibitor    Deep venous thrombosis of lower extremity (HCC) 05/24/2023   Dizziness    Dizziness with standing after squatting, March, 2012   DVT (deep venous thrombosis) (HCC)    Dyslipidemia    LDL elevation,Patient does not want statin   Family history of breast cancer    GERD (gastroesophageal reflux disease)    H/O shortness of breath    CPX 10/18:  No evidence of cardiopulmonary limitation. Suspect exercise intolerance related to weight and deconditioning.    Hypothyroidism    ICD (implantable cardiac defibrillator) battery depletion    Dr. Rodolfo [name], June, 2009(artifactual atrial tachycardia response events addressed by reprogramming in parentheses   Leg swelling    right leg   Mitral regurgitation     Nonischemic cardiomyopathy (HCC)    Etiology unknown, diagnosed 2008   Paroxysmal atrial fibrillation (HCC) 06/20/2022   PE (pulmonary thromboembolism) (HCC)    Personal history of radiation therapy 2017   Pneumonia due to COVID-19 virus 10/2019   Pulmonary embolism (HCC) 04/21/2020   TIA (transient ischemic attack)    No CT or MRI abnormality, aspirin  therapy   TIA (transient ischemic attack) 2007    Current Outpatient Medications:    acetaminophen  (TYLENOL ) 500 MG tablet, Take 500 mg by mouth every 6 (six) hours as needed for mild pain or moderate pain., Disp: , Rfl:    apixaban  (ELIQUIS ) 5 MG TABS tablet, Take 1 tablet (5 mg total) by mouth 2 (two) times daily., Disp: 180 tablet, Rfl: 4   carvedilol  (COREG ) 3.125 MG tablet, Take 1 tablet (3.125 mg total) by mouth 2 (two) times daily. NEEDS FOLLOW UP APPOINTMENT FOR ANYMORE REFILLS, Disp: 180 tablet, Rfl: 3   chlorpheniramine  (CHLOR-TRIMETON ) 4 MG tablet, Take 1 tablet (4 mg total) by mouth every 4 (four) hours as needed for allergies., Disp: 14 tablet, Rfl: 0   diclofenac  Sodium (VOLTAREN  ARTHRITIS PAIN) 1 % GEL, Apply 4 g topically 4 (four) times daily as needed., Disp: 150 g, Rfl: 1   eplerenone  (INSPRA ) 25 MG tablet, TAKE 1 TABLET BY MOUTH ONCE DAILY., Disp: 90 tablet, Rfl: 2   famotidine  (PEPCID ) 20 MG tablet, One after supper, Disp: 90 tablet, Rfl: 2   fluticasone  (FLONASE )  50 MCG/ACT nasal spray, Place 2 sprays into both nostrils daily., Disp: 16 g, Rfl: 6   levothyroxine  (SYNTHROID ) 75 MCG tablet, Take 1 tablet (75 mcg total) by mouth daily before breakfast., Disp: 90 tablet, Rfl: 4   linaclotide  (LINZESS ) 290 MCG CAPS capsule, Take one daily starting one week before your colonoscopy, Disp: 8 capsule, Rfl:    pantoprazole  (PROTONIX ) 40 MG tablet, Take 1 tablet (40 mg total) by mouth daily before breakfast. Take 30-60 min before first meal of the day, Disp: 90 tablet, Rfl: 2   rosuvastatin  (CRESTOR ) 10 MG tablet, Take 1 tablet (10 mg  total) by mouth daily., Disp: 90 tablet, Rfl: 3   valsartan  (DIOVAN ) 80 MG tablet, Take 1 tablet (80 mg total) by mouth 2 (two) times daily., Disp: 60 tablet, Rfl: 11 Social History   Socioeconomic History   Marital status: Widowed    Spouse name: Not on file   Number of children: Not on file   Years of education: Not on file   Highest education level: Some college, no degree  Occupational History   Occupation: Widow  Tobacco Use   Smoking status: Never   Smokeless tobacco: Never  Vaping Use   Vaping status: Never Used  Substance and Sexual Activity   Alcohol use: No   Drug use: No   Sexual activity: Not Currently    Birth control/protection: Surgical    Comment: tubal  Other Topics Concern   Not on file  Social History Narrative   Not on file   Social Drivers of Health   Financial Resource Strain: Medium Risk (10/06/2023)   Overall Financial Resource Strain (CARDIA)    Difficulty of Paying Living Expenses: Somewhat hard  Food Insecurity: Food Insecurity Present (10/06/2023)   Hunger Vital Sign    Worried About Running Out of Food in the Last Year: Sometimes true    Ran Out of Food in the Last Year: Never true  Transportation Needs: No Transportation Needs (10/06/2023)   PRAPARE - Administrator, Civil Service (Medical): No    Lack of Transportation (Non-Medical): No  Physical Activity: Unknown (10/06/2023)   Exercise Vital Sign    Days of Exercise per Week: 0 days    Minutes of Exercise per Session: Not on file  Stress: No Stress Concern Present (10/06/2023)   [employer] of Occupational Health - Occupational Stress Questionnaire    Feeling of Stress : Only a little  Social Connections: Moderately Integrated (10/06/2023)   Social Connection and Isolation Panel [NHANES]    Frequency of Communication with Friends and Family: More than three times a week    Frequency of Social Gatherings with Friends and Family: Twice a week    Attends Religious  Services: More than 4 times per year    Active Member of [employer] or Organizations: Yes    Attends Banker Meetings: More than 4 times per year    Marital Status: Widowed  Intimate Partner Violence: Unknown (01/25/2022)   Received from [facility]   HITS    Physically Hurt: Not on file    Insult or Talk Down To: Not on file    Threaten Physical Harm: Not on file    Scream or Curse: Not on file   Family History  Problem Relation Age of Onset   Heart attack Mother    Cancer Mother        breast   Breast cancer Mother 59   Hypertension Sister  Hypertension Brother    Cancer Paternal Grandmother        possible cancer, unknown type   Cancer Maternal Aunt        breast   Breast cancer Maternal Aunt    Stroke Neg Hx    Diabetes Neg Hx    Colon cancer Neg Hx     Objective: Office vital signs reviewed. There were no vitals taken for this visit.  Physical Examination:  General: Awake, alert, *** nourished, No acute distress HEENT: Normal    Neck: No masses palpated. No lymphadenopathy    Ears: Tympanic membranes intact, normal light reflex, no erythema, no bulging    Eyes: PERRLA, extraocular membranes intact, sclera ***    Nose: nasal turbinates moist, *** nasal discharge    Throat: moist mucus membranes, no erythema, *** tonsillar exudate.  Airway is patent Cardio: regular rate and rhythm, S1S2 heard, no murmurs appreciated Pulm: clear to auscultation bilaterally, no wheezes, rhonchi or rales; normal work of breathing on room air GI: soft, non-tender, non-distended, bowel sounds present x4, no hepatomegaly, no splenomegaly, no masses GU: external vaginal tissue ***, cervix ***, *** punctate lesions on cervix appreciated, *** discharge from cervical os, *** bleeding, *** cervical motion tenderness, *** abdominal/ adnexal masses Extremities: warm, well perfused, No edema, cyanosis or clubbing; +*** pulses bilaterally MSK: *** gait and ***  station Skin: dry; intact; no rashes or lesions Neuro: *** Strength and light touch sensation grossly intact, *** DTRs ***/4  Assessment/ Plan: 63 y.o. female   Acute pain of left knee  ***   [name] Guerin, DO [facility] [phone]

## 2024-02-15 ENCOUNTER — Encounter: Payer: Self-pay | Admitting: Internal Medicine

## 2024-02-16 ENCOUNTER — Ambulatory Visit (INDEPENDENT_AMBULATORY_CARE_PROVIDER_SITE_OTHER): Admitting: Family Medicine

## 2024-02-16 ENCOUNTER — Encounter: Payer: Self-pay | Admitting: Family Medicine

## 2024-02-16 VITALS — BP 128/79 | HR 68 | Temp 98.5°F | Ht 67.0 in | Wt 230.0 lb

## 2024-02-16 DIAGNOSIS — M25562 Pain in left knee: Secondary | ICD-10-CM

## 2024-02-21 ENCOUNTER — Other Ambulatory Visit (HOSPITAL_BASED_OUTPATIENT_CLINIC_OR_DEPARTMENT_OTHER): Payer: Self-pay

## 2024-02-21 ENCOUNTER — Ambulatory Visit (HOSPITAL_BASED_OUTPATIENT_CLINIC_OR_DEPARTMENT_OTHER): Admitting: Orthopaedic Surgery

## 2024-02-21 DIAGNOSIS — M25562 Pain in left knee: Secondary | ICD-10-CM | POA: Diagnosis not present

## 2024-02-21 MED ORDER — OXYCODONE HCL 5 MG PO TABS
5.0000 mg | ORAL_TABLET | ORAL | 0 refills | Status: DC | PRN
Start: 2024-02-21 — End: 2024-04-02
  Filled 2024-02-21: qty 10, 2d supply, fill #0

## 2024-02-21 MED ORDER — ACETAMINOPHEN 500 MG PO TABS
500.0000 mg | ORAL_TABLET | Freq: Three times a day (TID) | ORAL | 0 refills | Status: AC
  Filled 2024-02-21: qty 30, 10d supply, fill #0

## 2024-02-21 NOTE — Progress Notes (Addendum)
 Chief Complaint: Left knee pain     History of Present Illness:    [name] [name] is a 63 y.o. female presents with ongoing left knee pain.  She has been having pain for the past 2 weeks.  She does state that she felt an injury where she woke up with persistent swelling and inability to put weight medially.  This is somewhat gotten better over the course of the last several days with therapy and strengthening although she does have persistent swelling.  She has not had any previous history of surgeries.  She is currently disabled although does enjoy being active and walking.  She is here today for further discussion    PMH/PSH/Family History/Social History/Meds/Allergies:    Past Medical History:  Diagnosis Date   A-fib (HCC)    Acute deep vein thrombosis (DVT) of right popliteal vein (HCC) 04/13/2022   Acute pulmonary embolism (HCC) 04/12/2022   AICD (automatic cardioverter/defibrillator) present 2009   [employer]- replaced 2016   Arthritis    Bradycardia    Breast cancer (HCC) 02/24/2016   s/p lumpectomy and radiation but refused chemotherapy   Chronic systolic CHF (congestive heart failure) (HCC)    Cough due to angiotensin-converting enzyme inhibitor    Deep venous thrombosis of lower extremity (HCC) 05/24/2023   Dizziness    Dizziness with standing after squatting, March, 2012   DVT (deep venous thrombosis) (HCC)    Dyslipidemia    LDL elevation,Patient does not want statin   Family history of breast cancer    GERD (gastroesophageal reflux disease)    H/O shortness of breath    CPX 10/18:  No evidence of cardiopulmonary limitation. Suspect exercise intolerance related to weight and deconditioning.    Hypothyroidism    ICD (implantable cardiac defibrillator) battery depletion    Dr. Rodolfo Clan, June, 2009(artifactual atrial tachycardia response events addressed by reprogramming in parentheses   Leg swelling    right leg   Mitral regurgitation    Nonischemic  cardiomyopathy (HCC)    Etiology unknown, diagnosed 2008   Paroxysmal atrial fibrillation (HCC) 06/20/2022   PE (pulmonary thromboembolism) (HCC)    Personal history of radiation therapy 2017   Pneumonia due to COVID-19 virus 10/2019   Pulmonary embolism (HCC) 04/21/2020   TIA (transient ischemic attack)    No CT or MRI abnormality, aspirin  therapy   TIA (transient ischemic attack) 2007   Past Surgical History:  Procedure Laterality Date   BREAST BIOPSY Left 02/24/2016   U/S Core   BREAST BIOPSY Left 02/24/2016   U/S Core   BREAST LUMPECTOMY Left 04/05/2016   invasive ductal    BREAST LUMPECTOMY WITH RADIOACTIVE SEED AND SENTINEL LYMPH NODE BIOPSY Left 04/05/2016   Procedure: BREAST LUMPECTOMY WITH RADIOACTIVE SEED AND SENTINEL LYMPH NODE BIOPSY;  Surgeon: Juanita Norlander, MD;  Location: [facility] OR;  Service: General;  Laterality: Left;   CARDIAC CATHETERIZATION N/A 08/05/2016   Procedure: Left Heart Cath and Coronary Angiography;  Surgeon: Peter M Swaziland, MD;  [facility] INVASIVE CV LAB;  Service: Cardiovascular;  Laterality: N/A;   CARDIAC DEFIBRILLATOR PLACEMENT  09   [employer] no remote   COLONOSCOPY     COLONOSCOPY N/A 01/19/2024   Procedure: COLONOSCOPY;  Surgeon: Vinetta Greening, DO;  Location: [facility] ENDO SUITE;  Service: Endoscopy;  Laterality: N/A;  11:00 am, asa 3/4   CYST EXCISION Left    back of leg-lt    ESOPHAGOGASTRODUODENOSCOPY N/A 01/19/2024   Procedure: EGD (ESOPHAGOGASTRODUODENOSCOPY);  Surgeon:  Vinetta Greening, DO;  Location: [facility];  Service: Endoscopy;  Laterality: N/A;   IMPLANTABLE CARDIOVERTER DEFIBRILLATOR GENERATOR CHANGE N/A 12/29/2014   Procedure: IMPLANTABLE CARDIOVERTER DEFIBRILLATOR GENERATOR CHANGE;  Surgeon: Verona Goodwill, MD;  Location: [facility] CATH LAB;  Service: Cardiovascular;  Laterality: N/A;   LATERAL EPICONDYLE RELEASE  10/11/2012   Procedure: TENNIS ELBOW RELEASE;  Surgeon: Ferd Householder, MD;  Location: [facility];   Service: Orthopedics;  Laterality: Right;  RIGHT ELBOW: TENOTOMY ELBOW LATERAL EPICONDYLITIS TENNIS ELBOW: RADIAL TUNNEL RELEASE   POLYPECTOMY  01/19/2024   Procedure: POLYPECTOMY;  Surgeon: Vinetta Greening, DO;  Location: [facility];  Service: Endoscopy;;   PORT-A-CATH REMOVAL  04/13/2016   Procedure: MINOR REMOVAL PORT-A-CATH;  Surgeon: Dareen Ebbing, MD;  Location:  SURGERY CENTER;  Service: General;;   PORTACATH PLACEMENT Right 04/05/2016   Procedure: INSERTION PORT-A-CATH WITH US ;  Surgeon: Juanita Norlander, MD;  Location: [facility] OR;  Service: General;  Laterality: Right;  right IJ   TOTAL THYROIDECTOMY  2001   TUBAL LIGATION  94   UPPER GI ENDOSCOPY     Social History   Socioeconomic History   Marital status: Widowed    Spouse name: Not on file   Number of children: Not on file   Years of education: Not on file   Highest education level: Some college, no degree  Occupational History   Occupation: Widow  Tobacco Use   Smoking status: Never   Smokeless tobacco: Never  Vaping Use   Vaping status: Never Used  Substance and Sexual Activity   Alcohol use: No   Drug use: No   Sexual activity: Not Currently    Birth control/protection: Surgical    Comment: tubal  Other Topics Concern   Not on file  Social History Narrative   Not on file   Social Drivers of Health   Financial Resource Strain: Medium Risk (10/06/2023)   Overall Financial Resource Strain (CARDIA)    Difficulty of Paying Living Expenses: Somewhat hard  Food Insecurity: Food Insecurity Present (10/06/2023)   Hunger Vital Sign    Worried About Running Out of Food in the Last Year: Sometimes true    Ran Out of Food in the Last Year: Never true  Transportation Needs: No Transportation Needs (10/06/2023)   PRAPARE - Administrator, Civil Service (Medical): No    Lack of Transportation (Non-Medical): No  Physical Activity: Unknown (10/06/2023)   Exercise Vital Sign    Days of Exercise per Week:  0 days    Minutes of Exercise per Session: Not on file  Stress: No Stress Concern Present (10/06/2023)   [employer] of Occupational Health - Occupational Stress Questionnaire    Feeling of Stress : Only a little  Social Connections: Moderately Integrated (10/06/2023)   Social Connection and Isolation Panel [NHANES]    Frequency of Communication with Friends and Family: More than three times a week    Frequency of Social Gatherings with Friends and Family: Twice a week    Attends Religious Services: More than 4 times per year    Active Member of [employer] or Organizations: Yes    Attends Banker Meetings: More than 4 times per year    Marital Status: Widowed   Family History  Problem Relation Age of Onset   Heart attack Mother    Cancer Mother        breast   Breast cancer Mother 58   Hypertension Sister  Hypertension Brother    Cancer Paternal Grandmother        possible cancer, unknown type   Cancer Maternal Aunt        breast   Breast cancer Maternal Aunt    Stroke Neg Hx    Diabetes Neg Hx    Colon cancer Neg Hx    Allergies  Allergen Reactions   Avelox [Moxifloxacin Hcl In Nacl] Shortness Of Breath    Shortness of breath   Clindamycin/Lincomycin Shortness Of Breath and Diarrhea    Breathing/diarrhea     Kiwi Extract Anaphylaxis    Makes pt. Feel like her throat is closing    Bactrim [Sulfamethoxazole-Trimethoprim] Swelling   Levofloxacin Other (See Comments)    JOINT PAIN JOINT PAIN   Simvastatin Other (See Comments)    Joint pain   Spironolactone  Other (See Comments)    burning   Moxifloxacin Hcl Other (See Comments)   Tramadol  Nausea Only   Current Outpatient Medications  Medication Sig Dispense Refill   acetaminophen  (TYLENOL ) 500 MG tablet Take 1 tablet (500 mg total) by mouth every 8 (eight) hours for 10 days. 30 tablet 0   oxyCODONE  (ROXICODONE ) 5 MG immediate release tablet Take 1 tablet (5 mg total) by mouth every 4 (four) hours as  needed for severe pain (pain score 7-10) or breakthrough pain. 10 tablet 0   acetaminophen  (TYLENOL ) 500 MG tablet Take 500 mg by mouth every 6 (six) hours as needed for mild pain or moderate pain.     apixaban  (ELIQUIS ) 5 MG TABS tablet Take 1 tablet (5 mg total) by mouth 2 (two) times daily. 180 tablet 4   carvedilol  (COREG ) 3.125 MG tablet Take 1 tablet (3.125 mg total) by mouth 2 (two) times daily. NEEDS FOLLOW UP APPOINTMENT FOR ANYMORE REFILLS 180 tablet 3   eplerenone  (INSPRA ) 25 MG tablet TAKE 1 TABLET BY MOUTH ONCE DAILY. 90 tablet 2   famotidine  (PEPCID ) 20 MG tablet One after supper 90 tablet 2   fluticasone  (FLONASE ) 50 MCG/ACT nasal spray Place 2 sprays into both nostrils daily. 16 g 6   levothyroxine  (SYNTHROID ) 75 MCG tablet Take 1 tablet (75 mcg total) by mouth daily before breakfast. 90 tablet 4   pantoprazole  (PROTONIX ) 40 MG tablet Take 1 tablet (40 mg total) by mouth daily before breakfast. Take 30-60 min before first meal of the day 90 tablet 2   rosuvastatin  (CRESTOR ) 10 MG tablet Take 1 tablet (10 mg total) by mouth daily. 90 tablet 3   valsartan  (DIOVAN ) 80 MG tablet Take 1 tablet (80 mg total) by mouth 2 (two) times daily. 60 tablet 11   No current facility-administered medications for this visit.   No results found.  Review of Systems:   A ROS was performed including pertinent positives and negatives as documented in the HPI.  Physical Exam :   Constitutional: NAD and appears stated age Neurological: Alert and oriented Psych: Appropriate affect and cooperative There were no vitals taken for this visit.   Comprehensive Musculoskeletal Exam:    Left knee with tenderness about the medial patellofemoral joint with positive McMurray, range of motion is from -3 to 130 degrees.  Distal neurosensory exam is intact, negative Lachman, negative posterior drawer, no varus or valgus laxity with 0 or 30 degrees   Imaging:   Xray (4 views left knee): Normal     I  personally reviewed and interpreted the radiographs.   Assessment and Plan:   63 y.o. female with left knee  pain which I do believe to be consistent with a possible meniscal root injury.  That being said she is not able to obtain an MRI as a result of her pacemaker being incompatible.  Given this I did discuss treatment options for her knee injury.  I did discuss that we could ultimately perform a diagnostic arthroscopy with meniscal repair as needed.  I did discuss that this is somewhat of a complicated issue as her exam and history are quite consistent with a meniscal root injury but she is not able to obtain an MRI.  I did discuss the possibility and progression of osteoarthritis should she have an underlying meniscal root injury that is not picked up.  After discussion of risks and benefits she is ultimately elected for left knee arthroscopy with possible medial meniscal repair  -Plan for left knee arthroscopy with possible medial meniscal repair   After a lengthy discussion of treatment options, including risks, benefits, alternatives, complications of surgical and nonsurgical conservative options, the patient elected surgical repair.   The patient  is aware of the material risks  and complications including, but not limited to injury to adjacent structures, neurovascular injury, infection, numbness, bleeding, implant failure, thermal burns, stiffness, persistent pain, failure to heal, disease transmission from allograft, need for further surgery, dislocation, anesthetic risks, blood clots, risks of death,and others. The probabilities of surgical success and failure discussed with patient given their particular co-morbidities.The time and nature of expected rehabilitation and recovery was discussed.The patient's questions were all answered preoperatively.  No barriers to understanding were noted. I explained the natural history of the disease process and Rx rationale.  I explained to the patient  what I considered to be reasonable expectations given their personal situation.  The final treatment plan was arrived at through a shared patient decision making process model.    I personally saw and evaluated the patient, and participated in the management and treatment plan.  Wilhelmenia Harada, MD Attending Physician, Orthopedic Surgery  This document was dictated using Dragon voice recognition software. A reasonable attempt at proof reading has been made to minimize errors.

## 2024-02-22 ENCOUNTER — Telehealth: Payer: Self-pay

## 2024-02-22 NOTE — Telephone Encounter (Signed)
   Name: [name] [name]  DOB: 07/13/1961  MRN: [id number]  Primary Cardiologist: Ola Berger, MD  Chart reviewed as part of pre-operative protocol coverage. The patient has an upcoming visit scheduled with Dr. Avanell Bob on 03/04/24 at which time clearance can be addressed in case there are any issues that would impact surgical recommendations.  Left knee arthroscopy is not yet scheduled as below. I added preop FYI to appointment note so that provider is aware to address at time of outpatient visit.  Per office protocol the cardiology provider should forward their finalized clearance decision and recommendations regarding antiplatelet therapy to the requesting party below.    Eliquis  has not been prescribed by cardiology office, recommendations regarding holding of Eliquis  will need to come from prescribing provider (pts PCP).   I will route this message as FYI to requesting party and remove this message from the preop box as separate preop APP input not needed at this time.   Please call with any questions.  Raudel Bazen D Ramisa Duman, NP  02/22/2024, 2:02 PM

## 2024-02-22 NOTE — Telephone Encounter (Signed)
   Pre-operative Risk Assessment    Patient Name: [name] [name]  DOB: 03-Nov-1960 MRN: [id number]   Date of last office visit: 10/10/23 STEVEN KLEIN, MD Date of next office visit: 03/04/24 Ola Berger, MD   Request for Surgical Clearance    Procedure:   LEFT KNEE ARTHROSCOPY   C   POSSIBLE MEDIAL MENISACUS REPAIR  Date of Surgery:  Clearance TBD                                Surgeon:  NOT INDICATED Surgeon's Group or Practice Name:  [facility] Phone number:  [phone] Fax number:  [phone]  ATTN: APRIL B   Type of Clearance Requested:   - Medical  - Pharmacy:  Hold Apixaban  (Eliquis ) 2 DAYS PRIOR   Type of Anesthesia:  General    Additional requests/questions:    Signed, Collin Deal   02/22/2024, 10:00 AM

## 2024-03-03 NOTE — Progress Notes (Unsigned)
 Cardiology Office Note:    Date:  03/04/2024   ID:  [name] [name], DOB 1961/05/30, MRN [id number]  PCP:  Eliodoro Guerin, DO     History of Present Illness:    [name] [name] is a 63 y.o. female with a hx of HFrEF (s/p ICD), HL, TIA, GERD, breast cancer (s/p lumpectomy and XRT - refused chemo), hypothyroidism,  She has long h/o NICM dating back to 2007. Cath in 2017 with normal cors and EF 25-30%. S/p s-ICD. Medical therapy has been limited by multiple medication intolerances (Spironolactone  caused body burning sensation, could not afford Bidil and had fatigue with high does of Entresto ).  April 2022 EF 30-35%, RV systolic fxn moderately reduced. Had lost 15lbs at that time.   June 2023  Admitted with chest pain and SOB. CT chest showed small RLL PE. Started on -  Eliquis . Echo showed EF 35-40%, RV OK.   I saw the pt in clnic in Nov 2024   She was seen by Doyle Generous in Dec 2024  OVerall the pt says her breathing is OK  She denies CP   No dizziness  No palpitations  SHe does have L knee problems  WIll undergo arthroscopy.   Admits her diet is horrible      Past Medical History:  Diagnosis Date   A-fib (HCC)    Acute deep vein thrombosis (DVT) of right popliteal vein (HCC) 04/13/2022   Acute pulmonary embolism (HCC) 04/12/2022   AICD (automatic cardioverter/defibrillator) present 2009   [employer]- replaced 2016   Arthritis    Bradycardia    Breast cancer (HCC) 02/24/2016   s/p lumpectomy and radiation but refused chemotherapy   Chronic systolic CHF (congestive heart failure) (HCC)    Cough due to angiotensin-converting enzyme inhibitor    Deep venous thrombosis of lower extremity (HCC) 05/24/2023   Dizziness    Dizziness with standing after squatting, March, 2012   DVT (deep venous thrombosis) (HCC)    Dyslipidemia    LDL elevation,Patient does not want statin   Family history of breast cancer    GERD (gastroesophageal reflux disease)    H/O shortness of breath     CPX 10/18:  No evidence of cardiopulmonary limitation. Suspect exercise intolerance related to weight and deconditioning.    Hypothyroidism    ICD (implantable cardiac defibrillator) battery depletion    Dr. Rodolfo Clan, June, 2009(artifactual atrial tachycardia response events addressed by reprogramming in parentheses   Leg swelling    right leg   Mitral regurgitation    Nonischemic cardiomyopathy (HCC)    Etiology unknown, diagnosed 2008   Paroxysmal atrial fibrillation (HCC) 06/20/2022   PE (pulmonary thromboembolism) (HCC)    Personal history of radiation therapy 2017   Pneumonia due to COVID-19 virus 10/2019   Pulmonary embolism (HCC) 04/21/2020   TIA (transient ischemic attack)    No CT or MRI abnormality, aspirin  therapy   TIA (transient ischemic attack) 2007    Past Surgical History:  Procedure Laterality Date   BREAST BIOPSY Left 02/24/2016   U/S Core   BREAST BIOPSY Left 02/24/2016   U/S Core   BREAST LUMPECTOMY Left 04/05/2016   invasive ductal    BREAST LUMPECTOMY WITH RADIOACTIVE SEED AND SENTINEL LYMPH NODE BIOPSY Left 04/05/2016   Procedure: BREAST LUMPECTOMY WITH RADIOACTIVE SEED AND SENTINEL LYMPH NODE BIOPSY;  Surgeon: Juanita Norlander, MD;  Location: [facility] OR;  Service: General;  Laterality: Left;   CARDIAC CATHETERIZATION N/A 08/05/2016   Procedure: Left Heart  Cath and Coronary Angiography;  Surgeon: Peter M Swaziland, MD;  Location: [facility] INVASIVE CV LAB;  Service: Cardiovascular;  Laterality: N/A;   CARDIAC DEFIBRILLATOR PLACEMENT  09   [employer] no remote   COLONOSCOPY     COLONOSCOPY N/A 01/19/2024   Procedure: COLONOSCOPY;  Surgeon: Vinetta Greening, DO;  Location: [facility];  Service: Endoscopy;  Laterality: N/A;  11:00 am, asa 3/4   CYST EXCISION Left    back of leg-lt    ESOPHAGOGASTRODUODENOSCOPY N/A 01/19/2024   Procedure: EGD (ESOPHAGOGASTRODUODENOSCOPY);  Surgeon: Vinetta Greening, DO;  Location: [facility];  Service: Endoscopy;  Laterality: N/A;    IMPLANTABLE CARDIOVERTER DEFIBRILLATOR GENERATOR CHANGE N/A 12/29/2014   Procedure: IMPLANTABLE CARDIOVERTER DEFIBRILLATOR GENERATOR CHANGE;  Surgeon: Verona Goodwill, MD;  Location: [facility] CATH LAB;  Service: Cardiovascular;  Laterality: N/A;   LATERAL EPICONDYLE RELEASE  10/11/2012   Procedure: TENNIS ELBOW RELEASE;  Surgeon: Ferd Householder, MD;  Location: [facility];  Service: Orthopedics;  Laterality: Right;  RIGHT ELBOW: TENOTOMY ELBOW LATERAL EPICONDYLITIS TENNIS ELBOW: RADIAL TUNNEL RELEASE   POLYPECTOMY  01/19/2024   Procedure: POLYPECTOMY;  Surgeon: Vinetta Greening, DO;  Location: [facility];  Service: Endoscopy;;   PORT-A-CATH REMOVAL  04/13/2016   Procedure: MINOR REMOVAL PORT-A-CATH;  Surgeon: Dareen Ebbing, MD;  Location: [facility];  Service: General;;   PORTACATH PLACEMENT Right 04/05/2016   Procedure: INSERTION PORT-A-CATH WITH US ;  Surgeon: Juanita Norlander, MD;  Location: [facility] OR;  Service: General;  Laterality: Right;  right IJ   TOTAL THYROIDECTOMY  2001   TUBAL LIGATION  94   UPPER GI ENDOSCOPY      Current Medications: Current Meds  Medication Sig   acetaminophen  (TYLENOL ) 500 MG tablet Take 500 mg by mouth every 6 (six) hours as needed for mild pain or moderate pain.   apixaban  (ELIQUIS ) 5 MG TABS tablet Take 1 tablet (5 mg total) by mouth 2 (two) times daily.   carvedilol  (COREG ) 3.125 MG tablet Take 1 tablet (3.125 mg total) by mouth 2 (two) times daily. NEEDS FOLLOW UP APPOINTMENT FOR ANYMORE REFILLS   eplerenone  (INSPRA ) 25 MG tablet TAKE 1 TABLET BY MOUTH ONCE DAILY.   famotidine  (PEPCID ) 20 MG tablet One after supper   fluticasone  (FLONASE ) 50 MCG/ACT nasal spray Place 2 sprays into both nostrils daily.   levothyroxine  (SYNTHROID ) 75 MCG tablet Take 1 tablet (75 mcg total) by mouth daily before breakfast.   loratadine (CLARITIN) 10 MG tablet Take 10 mg by mouth as needed.   oxyCODONE  (ROXICODONE ) 5 MG immediate release tablet Take 1 tablet (5  mg total) by mouth every 4 (four) hours as needed for severe pain (pain score 7-10) or breakthrough pain.   pantoprazole  (PROTONIX ) 40 MG tablet Take 1 tablet (40 mg total) by mouth daily before breakfast. Take 30-60 min before first meal of the day   valsartan  (DIOVAN ) 80 MG tablet Take 1 tablet (80 mg total) by mouth 2 (two) times daily.     Allergies:   Avelox [moxifloxacin hcl in nacl], Clindamycin/lincomycin, Kiwi extract, Bactrim [sulfamethoxazole-trimethoprim], Levofloxacin, Simvastatin, Spironolactone , Moxifloxacin hcl, and Tramadol    Social History   Socioeconomic History   Marital status: Widowed    Spouse name: Not on file   Number of children: Not on file   Years of education: Not on file   Highest education level: Some college, no degree  Occupational History   Occupation: Widow  Tobacco Use   Smoking  status: Never   Smokeless tobacco: Never  Vaping Use   Vaping status: Never Used  Substance and Sexual Activity   Alcohol use: No   Drug use: No   Sexual activity: Not Currently    Birth control/protection: Surgical    Comment: tubal  Other Topics Concern   Not on file  Social History Narrative   Not on file   Social Drivers of Health   Financial Resource Strain: Medium Risk (10/06/2023)   Overall Financial Resource Strain (CARDIA)    Difficulty of Paying Living Expenses: Somewhat hard  Food Insecurity: Food Insecurity Present (10/06/2023)   Hunger Vital Sign    Worried About Running Out of Food in the Last Year: Sometimes true    Ran Out of Food in the Last Year: Never true  Transportation Needs: No Transportation Needs (10/06/2023)   PRAPARE - Administrator, Civil Service (Medical): No    Lack of Transportation (Non-Medical): No  Physical Activity: Unknown (10/06/2023)   Exercise Vital Sign    Days of Exercise per Week: 0 days    Minutes of Exercise per Session: Not on file  Stress: No Stress Concern Present (10/06/2023)   [employer]  of Occupational Health - Occupational Stress Questionnaire    Feeling of Stress : Only a little  Social Connections: Moderately Integrated (10/06/2023)   Social Connection and Isolation Panel [NHANES]    Frequency of Communication with Friends and Family: More than three times a week    Frequency of Social Gatherings with Friends and Family: Twice a week    Attends Religious Services: More than 4 times per year    Active Member of [employer] or Organizations: Yes    Attends Banker Meetings: More than 4 times per year    Marital Status: Widowed     Family History: The patient's family history includes Breast cancer in her maternal aunt; Breast cancer (age of onset: 3) in her mother; Cancer in her maternal aunt, mother, and paternal grandmother; Heart attack in her mother; Hypertension in her brother and sister. There is no history of Stroke, Diabetes, or Colon cancer.     EKGs/Labs/Other Studies Reviewed:    The following studies were reviewed today:  Echo  04/20/2022:  1. Left ventricular ejection fraction, by estimation, is 35 to 40%. The  left ventricle has moderately decreased function. The left ventricle  demonstrates global hypokinesis. Left ventricular diastolic parameters are  consistent with Grade I diastolic  dysfunction (impaired relaxation).   2. The mitral valve is normal in structure. Mild mitral valve  regurgitation.   3. The aortic valve is tricuspid. Aortic valve regurgitation is not  visualized.   4. The inferior vena cava is normal in size with greater than 50%  respiratory variability, suggesting right atrial pressure of 3 mmHg.   Comparison(s): No significant change from prior study. Prior images  reviewed side by side.   CTA Chest  04/12/2022: FINDINGS: Cardiovascular: Heart is enlarged in size. Thoracic aorta demonstrates atherosclerotic calcifications without aneurysmal dilatation or dissection. Pulmonary artery demonstrates a normal branching  pattern. There are a few filling defects identified within the right lower lobe pulmonary artery consistent with pulmonary emboli. No evidence of right heart strain is noted. No other filling defects are seen. Pacing device is noted.   Mediastinum/Nodes: Thoracic inlet is within normal limits. Changes of prior thyroid  surgery are noted. No hilar or mediastinal adenopathy is seen. The esophagus as visualized is within normal limits.  Lungs/Pleura: Lungs are well aerated bilaterally. No focal infiltrate or sizable effusion is seen. No sizable parenchymal nodules are noted.   Upper Abdomen: Visualized upper abdomen is unremarkable.   Musculoskeletal: Degenerative changes of the thoracic spine are seen. No rib abnormality is noted.   Review of the MIP images confirms the above findings.   IMPRESSION: Small pulmonary emboli within the right lower lobe as described. No right heart strain is noted.   No other acute abnormality is noted.   Critical Value/emergent results were called by telephone at the time of interpretation on 04/12/2022 at 9:57 pm to [facility] , who verbally acknowledged these results.   Aortic Atherosclerosis (ICD10-I70.0).  TTE 01/2021: IMPRESSIONS   1. Left ventricular ejection fraction, by estimation, is 30 to 35%. The  left ventricle has moderately decreased function. The left ventricle has  no regional wall motion abnormalities. The left ventricular internal  cavity size was mildly dilated. Left  ventricular diastolic parameters are consistent with Grade I diastolic  dysfunction (impaired relaxation).   2. Right ventricular systolic function is moderately reduced. The right  ventricular size is normal. There is normal pulmonary artery systolic  pressure.   3. Left atrial size was mildly dilated.   4. The mitral valve is normal in structure. Trivial mitral valve  regurgitation. No evidence of mitral stenosis.   5. The aortic valve is normal in  structure. Aortic valve regurgitation is  not visualized. No aortic stenosis is present.   6. The inferior vena cava is normal in size with greater than 50%  respiratory variability, suggesting right atrial pressure of 3 mmHg.   TTE 04/22/20:  1. Left ventricular ejection fraction, by estimation, is 35 to 40%. The  left ventricle has normal function. The left ventricle has no regional  wall motion abnormalities. Left ventricular diastolic parameters are  indeterminate.   2. Right ventricular systolic function is normal. The right ventricular  size is normal.   3. The mitral valve is normal in structure. No evidence of mitral valve  regurgitation. No evidence of mitral stenosis.   4. The aortic valve is tricuspid. Aortic valve regurgitation is not  visualized. No aortic stenosis is present.   5. The inferior vena cava is normal in size with greater than 50%  respiratory variability, suggesting right atrial pressure of 3 mmHg.   CPX 12/19/19: Exercise Time:    11:00   Speed (mph): 3.0       Grade (%): 7.5     RPE: 17   Reason stopped: dyspnea (7/10)   Additional symptoms: None reported   Resting HR: 85 Standing HR: 86 Peak HR: 161   (100% age predicted max HR)   BP rest: 128/84 Standing BP: 116/82 BP peak: 166/72   Peak VO2: 17.7 (97%% predicted peak VO2)   VE/VCO2 slope:  29   OUES: 2.02   Peak RER: 1.08   Ventilatory Threshold: 13.0 (71% predicted or measured peak VO2)   Peak RR 38   Peak Ventilation:  56.8   VE/MVV:  54%   PETCO2 at peak:  38   O2pulse:  11   (100% predicted O2pulse)    Interpretation   Notes: Patient gave a very good effort. Pulse-oximetry remained 97-99% for the duration of exercise.   ECG:  Resting ECG in sinus rhythm with occasional PVCs. HR response appropriate. There were frequent PVCs during exercise with no sustained arrhythmias or ST-T changes.   PFT:  Pre-exercise spirometry was within normal limits.  The MVV was normal.   CPX:   Exercise testing with gas exchange demonstrates a normal peak VO2 of 17.7 ml/kg/min (97% of the age/gender/weight matched sedentary norms). The RER of 1.08 indicates a near maximal effort. When adjusted to the patient's ideal body weight of 153.1 lb (69.5 kg) the peak VO2 is 24.7 ml/kg (ibw)/min (108% of the ibw-adjusted predicted). The VE/VCO2 slope is normal. The oxygen  uptake efficiency slope (OUES) is normal. The VO2 at the ventilatory threshold was normal at 71% of the predicted peak VO2. At peak exercise, the ventilation reached 54% of the measured MVV indicating ventilatory reserve remained. The O2pulse (a surrogate for stroke volume) increased with incremental exercise, reaching peak 11 ml/beat (100% predicted).    Conclusion: Exercise testing with gas exchange demonstrates normal functional capacity when compared to matched sedentary norms. There is no clear cardiopulmonary limitation. With corrections for ideal body weight, patient's body habitus appears the limiting factor. Overall CPX essentially unchanged from 2018.  Cath 2017: There is severe left ventricular systolic dysfunction. LV end diastolic pressure is normal. The left ventricular ejection fraction is 25-35% by visual estimate.   1. Normal coronary anatomy 2. Severe LV dysfunction 3. Elevated LV EDP   Plan: medical management.     EKG:  EKG shows SR 61 bpm    Recent Labs: 03/12/2023: B Natriuretic Peptide 13.8 06/21/2023: ALT 17; BUN 12; Creatinine, Ser 0.89; Hemoglobin 11.8; Magnesium  2.1; Platelets 240; Potassium 4.3; Sodium 141 12/22/2023: TSH 1.990   Recent Lipid Panel    Component Value Date/Time   CHOL 237 (H) 06/21/2023 1304   TRIG 72 06/21/2023 1304   HDL 91 06/21/2023 1304   CHOLHDL 2.6 06/21/2023 1304   LDLCALC 134 (H) 06/21/2023 1304     Physical Exam:    VS:  BP 110/70   Pulse 61   Ht 5\' 7"  (1.702 m)   Wt 226 lb 8 oz (102.7 kg)   SpO2 97%   BMI 35.47 kg/m     Wt Readings from Last 3  Encounters:  03/04/24 226 lb 8 oz (102.7 kg)  02/16/24 230 lb (104.3 kg)  01/17/24 224 lb (101.6 kg)     GEN: Obese 63 yo in NAD NECK: JVP is normal   CARDIAC: RRR  No S3  No  murmurs RESPIRATORY:  CTA bilaterally  ABDOMEN: Soft, non-tender,   No hepatomegaly Ext  No LE edema   EKG   NSR     PLAN:    In order of problems listed above:  #PAF    PT in SR   Denies palpitations    Keep on Eliquis , carvedilol      Can take an additional carvedilol  if heart racing     #HFrEF Nonischemic    Last echo in 2023 LVEF 35 to 40%    Mild MR CPX with normal functional capacity. Currently with NYHA class II symptoms.  Volume status is good    Keep on current regimen   (Entresto , carvedilol , eplereone) -Off laisx -No farxiga to given history of yeast infections -S/p ICD placement  #Recurrent PE: -On lifelong apixaban  5mg  BID  #HTN:  - BP remains well controlled   #HLD: -taking crestor .  Has not tolerated prava or simva -felt Zetia  lead to to too much urination  -Will check lipomed  Check BMET and CBC  Follow-up:  6 months.  Medication Adjustments/Labs and Tests Ordered: Current medicines are reviewed at length with the patient today.  Concerns regarding medicines are outlined above.  Orders Placed This Encounter  Procedures   EKG 12-Lead   No orders of the defined types were placed in this encounter.  There are no Patient Instructions on file for this visit.     Signed, Ola Berger, MD 03/04/2024  9:38 AM [facility]

## 2024-03-04 ENCOUNTER — Ambulatory Visit: Payer: Medicare HMO | Attending: Internal Medicine | Admitting: Internal Medicine

## 2024-03-04 ENCOUNTER — Encounter: Payer: Self-pay | Admitting: Internal Medicine

## 2024-03-04 ENCOUNTER — Telehealth: Payer: Self-pay

## 2024-03-04 VITALS — BP 110/70 | HR 61 | Ht 67.0 in | Wt 226.5 lb

## 2024-03-04 DIAGNOSIS — I519 Heart disease, unspecified: Secondary | ICD-10-CM | POA: Diagnosis not present

## 2024-03-04 DIAGNOSIS — I10 Essential (primary) hypertension: Secondary | ICD-10-CM

## 2024-03-04 NOTE — Telephone Encounter (Signed)
 Received vm from patient stating that there are errors in the 02/21/24 dictation as she viewed on mychart. Please call to discuss and ammend the "errors" [phone]

## 2024-03-04 NOTE — Telephone Encounter (Signed)
 Would you like me to call her or do you want to reach out to her?

## 2024-03-04 NOTE — Patient Instructions (Signed)
 Medication Instructions:   *If you need a refill on your cardiac medications before your next appointment, please call your pharmacy*  Lab Work:  If you have labs (blood work) drawn today and your tests are completely normal, you will receive your results only by: MyChart Message (if you have MyChart) OR A paper copy in the mail If you have any lab test that is abnormal or we need to change your treatment, we will call you to review the results.  Testing/Procedures:   Follow-Up: At [facility], you and your health needs are our priority.  As part of our continuing mission to provide you with exceptional heart care, our providers are all part of one team.  This team includes your primary Cardiologist (physician) and Advanced Practice Providers or APPs (Physician Assistants and Nurse Practitioners) who all work together to provide you with the care you need, when you need it.  Your next appointment:   6 month(s)  Provider:   Ola Berger, MD    We recommend signing up for the patient portal called "MyChart".  Sign up information is provided on this After Visit Summary.  MyChart is used to connect with patients for Virtual Visits (Telemedicine).  Patients are able to view lab/test results, encounter notes, upcoming appointments, etc.  Non-urgent messages can be sent to your provider as well.   To learn more about what you can do with MyChart, go to [email or web address].   Other Instructions

## 2024-03-05 ENCOUNTER — Ambulatory Visit: Payer: Self-pay | Admitting: *Deleted

## 2024-03-05 LAB — NMR, LIPOPROFILE
Cholesterol, Total: 189 mg/dL (ref 100–199)
HDL Particle Number: 42.7 umol/L (ref >=30.5)
HDL-C: 88 mg/dL (ref >39)
LDL Particle Number: 768 nmol/L (ref <1000)
LDL Size: 21.8 nm (ref >20.5)
LDL-C (NIH Calc): 91 mg/dL (ref 0–99)
Triglycerides: 51 mg/dL (ref 0–149)

## 2024-03-05 LAB — HEMOGLOBIN A1C
Est. average glucose Bld gHb Est-mCnc: 126 mg/dL
Hgb A1c MFr Bld: 6 % — ABNORMAL HIGH (ref 4.8–5.6)

## 2024-03-05 LAB — LIPOPROTEIN A (LPA): Lipoprotein (a): 296.6 nmol/L — ABNORMAL HIGH (ref <75.0)

## 2024-03-05 NOTE — Telephone Encounter (Signed)
 I called and talked to the pt. She said she has only had pain for 2 weeks and you put in your note 2 months. This is the only thing she needs corrected

## 2024-03-06 DIAGNOSIS — Z113 Encounter for screening for infections with a predominantly sexual mode of transmission: Secondary | ICD-10-CM | POA: Diagnosis not present

## 2024-03-06 DIAGNOSIS — Z01411 Encounter for gynecological examination (general) (routine) with abnormal findings: Secondary | ICD-10-CM | POA: Diagnosis not present

## 2024-03-06 DIAGNOSIS — Z124 Encounter for screening for malignant neoplasm of cervix: Secondary | ICD-10-CM | POA: Diagnosis not present

## 2024-03-06 DIAGNOSIS — Z1331 Encounter for screening for depression: Secondary | ICD-10-CM | POA: Diagnosis not present

## 2024-03-06 DIAGNOSIS — Z0142 Encounter for cervical smear to confirm findings of recent normal smear following initial abnormal smear: Secondary | ICD-10-CM | POA: Diagnosis not present

## 2024-03-06 DIAGNOSIS — Z0000 Encounter for general adult medical examination without abnormal findings: Secondary | ICD-10-CM | POA: Diagnosis not present

## 2024-03-20 NOTE — Progress Notes (Signed)
 Remote ICD transmission.

## 2024-03-20 NOTE — Addendum Note (Signed)
 Addended by: Lott Rouleau A on: 03/20/2024 10:45 AM   Modules accepted: Orders

## 2024-04-02 ENCOUNTER — Ambulatory Visit (INDEPENDENT_AMBULATORY_CARE_PROVIDER_SITE_OTHER): Admitting: Gastroenterology

## 2024-04-02 ENCOUNTER — Encounter: Payer: Self-pay | Admitting: Gastroenterology

## 2024-04-02 VITALS — BP 138/86 | HR 82 | Temp 98.7°F | Ht 67.0 in | Wt 231.6 lb

## 2024-04-02 DIAGNOSIS — K219 Gastro-esophageal reflux disease without esophagitis: Secondary | ICD-10-CM | POA: Diagnosis not present

## 2024-04-02 DIAGNOSIS — K581 Irritable bowel syndrome with constipation: Secondary | ICD-10-CM

## 2024-04-02 MED ORDER — LINACLOTIDE 290 MCG PO CAPS: 290.0000 ug | ORAL_CAPSULE | Freq: Every day | ORAL | 5 refills | Status: AC

## 2024-04-02 NOTE — Patient Instructions (Signed)
 You can use miralax  one capful every day as needed or even use Linzess  290mcg daily as needed. Samples provided and RX sent. Continue pantoprazole  40mg  daily before breakfast and pepcid  20mg  in evening for now. If you decide to try and wean off, you should consider stopping pepcid  at night to see if you tolerate. We also have options of reducing pantoprazole  dose to 20mg  as well. We will see you back in six months or call sooner if needed.

## 2024-04-02 NOTE — Progress Notes (Signed)
 GI Office Note    Referring Provider: Eliodoro Guerin, DO Primary Care Physician:  Eliodoro Guerin, DO  Primary Gastroenterologist: Rolando Cliche. Mordechai April, DO   Chief Complaint   Chief Complaint  Patient presents with   Follow-up    Follow up after colonoscopy. Still being constipated    History of Present Illness   [name] [name] is a 63 y.o. female presenting today for follow up. H/o IBS-C, possible LPR (per pulmonologist with cough/constant throat clearing). Has seen ENT with fiberoptic laryngoscopy 05/2023 with no significiant findings, advising speech therapy to manage throat clearing.     Overall doing well. Notes that she had previously tried weaning off PPI/H2 blocker in the past six month as recommended by her pulmonologist but she had recurrent symptoms. She does not want to try at this time. Never really had heartburn. Had dealt with cough and throat clearing. Continues to use miralax  when needed for constipation. Does not take daily. Sometimes has had to use bisacodyl if real uncomfortable. No melena, brbpr. Used Linzess  prior to her colon bowel prep and would like to try it. No abd pain. No n/v.  EGD 12/2023:  -z-line variable s/p bx, unremarkable -gastritis s/p bx, no h.pylori -normal duodenal bulb, first portion of duodenum and second portion of duodenum  Colonoscopy 12/2023: -two 4-23mm polyps in ascending colon and in cecum, tubular adenoma -non-bleeding internal hemorrhoids -repeat colonoscopy in five years   Medications   Current Outpatient Medications  Medication Sig Dispense Refill   apixaban  (ELIQUIS ) 5 MG TABS tablet Take 1 tablet (5 mg total) by mouth 2 (two) times daily. 180 tablet 4   carvedilol  (COREG ) 3.125 MG tablet Take 1 tablet (3.125 mg total) by mouth 2 (two) times daily. NEEDS FOLLOW UP APPOINTMENT FOR ANYMORE REFILLS 180 tablet 3   eplerenone  (INSPRA ) 25 MG tablet TAKE 1 TABLET BY MOUTH ONCE DAILY. 90 tablet 2   famotidine  (PEPCID ) 20 MG  tablet One after supper 90 tablet 2   fluticasone  (FLONASE ) 50 MCG/ACT nasal spray Place 2 sprays into both nostrils daily. 16 g 6   levothyroxine  (SYNTHROID ) 75 MCG tablet Take 1 tablet (75 mcg total) by mouth daily before breakfast. 90 tablet 4   loratadine (CLARITIN) 10 MG tablet Take 10 mg by mouth as needed.     pantoprazole  (PROTONIX ) 40 MG tablet Take 1 tablet (40 mg total) by mouth daily before breakfast. Take 30-60 min before first meal of the day 90 tablet 2   valsartan  (DIOVAN ) 80 MG tablet Take 1 tablet (80 mg total) by mouth 2 (two) times daily. 60 tablet 11   acetaminophen  (TYLENOL ) 500 MG tablet Take 500 mg by mouth every 6 (six) hours as needed for mild pain or moderate pain.     rosuvastatin  (CRESTOR ) 10 MG tablet Take 1 tablet (10 mg total) by mouth daily. 90 tablet 3   No current facility-administered medications for this visit.    Allergies   Allergies as of 04/02/2024 - Review Complete 04/02/2024  Allergen Reaction Noted   Avelox [moxifloxacin hcl in nacl] Shortness Of Breath 09/07/2012   Clindamycin/lincomycin Shortness Of Breath and Diarrhea 12/09/2014   Kiwi extract Anaphylaxis 09/07/2012   Bactrim [sulfamethoxazole-trimethoprim] Swelling 11/06/2014   Levofloxacin Other (See Comments) 10/21/2015   Simvastatin Other (See Comments) 07/10/2019   Spironolactone  Other (See Comments) 10/21/2015   Moxifloxacin hcl Other (See Comments) 07/19/2022   Tramadol  Nausea Only 05/17/2022        Review of Systems  General: Negative for anorexia, weight loss, fever, chills, fatigue, weakness. ENT: Negative for hoarseness, difficulty swallowing , nasal congestion. CV: Negative for chest pain, angina, palpitations, dyspnea on exertion, peripheral edema.  Respiratory: Negative for dyspnea at rest, dyspnea on exertion, cough, sputum, wheezing.  GI: See history of present illness. GU:  Negative for dysuria, hematuria, urinary incontinence, urinary frequency, nocturnal urination.   Endo: Negative for unusual weight change.     Physical Exam   BP 138/86   Pulse 82   Temp 98.7 F (37.1 C)   Ht 5\' 7"  (1.702 m)   Wt 231 lb 9.6 oz (105.1 kg)   BMI 36.27 kg/m    General: Well-nourished, well-developed in no acute distress.  Eyes: No icterus. Mouth: Oropharyngeal mucosa moist and pink   Lungs: Clear to auscultation bilaterally.  Heart: Regular rate and rhythm, no murmurs rubs or gallops.  Abdomen: Bowel sounds are normal, nontender, nondistended, no hepatosplenomegaly or masses,  no abdominal bruits or hernia , no rebound or guarding.  Rectal: not performed Extremities: No lower extremity edema. No clubbing or deformities. Neuro: Alert and oriented x 4   Skin: Warm and dry, no jaundice.   Psych: Alert and cooperative, normal mood and affect.  Labs   Lab Results  Component Value Date   NA 141 06/21/2023   CL 103 06/21/2023   K 4.3 06/21/2023   CO2 26 06/21/2023   BUN 12 06/21/2023   CREATININE 0.89 06/21/2023   EGFR 73 06/21/2023   CALCIUM  9.3 06/21/2023   PHOS 3.8 04/13/2022   ALBUMIN 4.0 06/21/2023   GLUCOSE 79 06/21/2023   Lab Results  Component Value Date   ALT 17 06/21/2023   AST 16 06/21/2023   ALKPHOS 70 06/21/2023   BILITOT 0.2 06/21/2023   Lab Results  Component Value Date   WBC 6.7 06/21/2023   HGB 11.8 06/21/2023   HCT 36.5 06/21/2023   MCV 92 06/21/2023   PLT 240 06/21/2023   Lab Results  Component Value Date   TSH 1.990 12/22/2023   Lab Results  Component Value Date   HGBA1C 6.0 (H) 03/04/2024    Imaging Studies   No results found.  Assessment/Plan:   IBS-C: doing ok. Using miralax  prn. Would like to try linzess .  -samples of Linzess  290mcg daily prn provided. RX sent.   GERD/LPR: previously unable to wean off PPI/H2, not interested in trying at this time -continue current regimen. If she decides to try and wean off again, stop Pepcid  first. If tolerated, we could then try decreasing pantoprazole  to 20mg  daily.   -reinforced anti-reflux measures -return ov in six months           Trudie Fuse. Harles Lied, MHS, PA-C [facility]

## 2024-04-11 ENCOUNTER — Telehealth (HOSPITAL_BASED_OUTPATIENT_CLINIC_OR_DEPARTMENT_OTHER): Payer: Self-pay | Admitting: Orthopaedic Surgery

## 2024-04-11 NOTE — Telephone Encounter (Signed)
 Patient last appointment was 4/30 and she said she was supposed to have a knee scope and has not heard anything about it. Best contact number [phone]

## 2024-04-16 NOTE — Telephone Encounter (Signed)
 I spoke with the patient and advised her we are waiting on surgical clearance from her Cardiologist to schedule her surgery. I reached out to Cardiology office today requesting a call back on the status of patient's clearance. It was suppose to be addressed at patients 03/04/24 appointment, but does not look like it was. Patient was advised she would be called as soon as I received an update in regards to clearance request.

## 2024-04-16 NOTE — Telephone Encounter (Signed)
 Pt seen in May 2025  Doing OK at time   Volume status good  Hx of Nonischemic cardiomyopathy with mod LV dysfunction Also concerning is Hx of recurrent PE  From cardiac standpoint pt is at some increaesed risk with arthroscopy but relatively low  Need to know plans for anticoagulation in periprocedure period given recurrent PE

## 2024-04-16 NOTE — Telephone Encounter (Signed)
 I will forward this to the preop APP to review if pt has been cleared.

## 2024-04-16 NOTE — Telephone Encounter (Signed)
 Dr. Okey,  Ms. Veith is requesting preoperative cardiac evaluation for left knee arthroscopy.  Procedure TBD.  She was seen by you in clinic on 03/04/2024.  She remained stable from a cardiac standpoint.  She denied chest pain and dizziness.  She denied palpitations.  Follow-up was planned for 6 months.  Would you be able to comment on cardiac risk for upcoming surgery?  Thank you for your help.  Please direct response to CV DIV preop pool.  [name] [name]. Randen Kauth NP-C     04/16/2024, 4:05 PM [facility] [address] Office [phone] Fax [phone]

## 2024-04-16 NOTE — Telephone Encounter (Signed)
 April calling to receive a update on the pts clearance. Please advise

## 2024-04-17 NOTE — Telephone Encounter (Signed)
 Clinical pharmacist to review Eliquis . Last DVT/PE 04/12/2022. Also had PE on 04/21/2020.

## 2024-04-18 DIAGNOSIS — M461 Sacroiliitis, not elsewhere classified: Secondary | ICD-10-CM | POA: Diagnosis not present

## 2024-04-18 DIAGNOSIS — M5459 Other low back pain: Secondary | ICD-10-CM | POA: Diagnosis not present

## 2024-04-18 DIAGNOSIS — M4716 Other spondylosis with myelopathy, lumbar region: Secondary | ICD-10-CM | POA: Diagnosis not present

## 2024-04-18 NOTE — Telephone Encounter (Signed)
 Patient with diagnosis of atrial fibrillation on Eliquis  for anticoagulation.    Procedure:  LEFT KNEE ARTHROSCOPY   C   POSSIBLE MEDIAL MENISACUS REPAIR   Date of Surgery:  Clearance TBD        CHA2DS2-VASc Score = 5   This indicates a 7.2% annual risk of stroke. The patient's score is based upon: CHF History: 1 HTN History: 1 Diabetes History: 0 Stroke History: 2 Vascular Disease History: 0 Age Score: 0 Gender Score: 1   Chart indicates history of TIA in 2007  Patient also has history of recurrent PE/DVT, in June of 2021 and 2023  CrCl 107 Platelet count 240  Patient has not  had an Afib/aflutter ablation within the last 3 months or DCCV within the last 30 days  Per office protocol, patient can hold Eliquis  for 2 days prior to procedure.   Patient will need bridging with Lovenox  (enoxaparin ) around procedure secondary to recurrent VTE.  **This guidance is not considered finalized until pre-operative APP has relayed final recommendations.**

## 2024-04-19 NOTE — Telephone Encounter (Signed)
   Patient Name: [name] [name]  DOB: April 13, 1961 MRN: [id number]  Primary Cardiologist: Vina Gull, MD  Chart reviewed as part of pre-operative protocol coverage. Given past medical history and time since last visit, based on ACC/AHA guidelines, Blondine M Kowal is at acceptable risk for the planned procedure without further cardiovascular testing.   Procedure:  LEFT KNEE ARTHROSCOPY   C   POSSIBLE MEDIAL MENISACUS REPAIR   Date of Surgery:  Clearance TBD          CHA2DS2-VASc Score = 5   This indicates a 7.2% annual risk of stroke. The patient's score is based upon: CHF History: 1 HTN History: 1 Diabetes History: 0 Stroke History: 2 Vascular Disease History: 0 Age Score: 0 Gender Score: 1   Chart indicates history of TIA in 2007   Patient also has history of recurrent PE/DVT, in June of 2021 and 2023   CrCl 107 Platelet count 240   Patient has not  had an Afib/aflutter ablation within the last 3 months or DCCV within the last 30 days  Per Pharmacy:   Per office protocol, patient can hold Eliquis  for 2 days prior to procedure.   Patient will need bridging with Lovenox  (enoxaparin ) around procedure secondary to recurrent VTE.   Per Dr.Ross 04/16/2024 Pt seen in May 2025  Doing OK at time   Volume status good  Hx of Nonischemic cardiomyopathy with mod LV dysfunction Also concerning is Hx of recurrent PE   From cardiac standpoint pt is at some increaesed risk with arthroscopy but relatively low  Need to know plans for anticoagulation in periprocedure period given recurrent PE   The patient was advised that if she develops new symptoms prior to surgery to contact our office to arrange for a follow-up visit, and she verbalized understanding.  I will route this recommendation to the requesting party via Epic fax function and remove from pre-op pool.  Please call with questions.  Lamarr Satterfield, NP 04/19/2024, 9:42 AM

## 2024-04-19 NOTE — Telephone Encounter (Signed)
 Just making sure that you know that this patient needs bridging. We will clear her once you make your recommendations. Thank you.

## 2024-04-22 ENCOUNTER — Other Ambulatory Visit: Payer: Self-pay | Admitting: Internal Medicine

## 2024-04-22 ENCOUNTER — Other Ambulatory Visit: Payer: Self-pay | Admitting: Family Medicine

## 2024-04-22 ENCOUNTER — Encounter: Payer: Self-pay | Admitting: Internal Medicine

## 2024-04-22 DIAGNOSIS — Z0000 Encounter for general adult medical examination without abnormal findings: Secondary | ICD-10-CM

## 2024-04-22 NOTE — Telephone Encounter (Signed)
 Surgeon office sent clearance notes again stating 2nd request. WE have the original request in process. Pt was seen recently by Dr. Okey. I will need to forward to our preop APP to review if the pt has been cleared.    It is never our intention to delay or post pone a pt's procedure/surgery.   Once Dr. Okey has cleared the pt she will have her nurse/cma fax her notes with any recommendations.

## 2024-04-22 NOTE — Telephone Encounter (Signed)
 [name], This pt is scheduled for knee arthroscopy.   Hx recurrent PE   Will need bridging  Can you assist with this.

## 2024-04-22 NOTE — Telephone Encounter (Signed)
   Patient Name: [name] [name]  DOB: 1960-12-11 MRN: [id number]  Primary Cardiologist: Vina Gull, MD  Chart reviewed as part of pre-operative protocol coverage. Given past medical history and time since last visit, based on ACC/AHA guidelines, [name] [name] is at acceptable risk for the planned procedure without further cardiovascular testing.   Patient has not  had an Afib/aflutter ablation within the last 3 months or DCCV within the last 30 days   Per Pharmacy:   Per office protocol, patient can hold Eliquis  for 2 days prior to procedure.   Patient will need bridging with Lovenox  (enoxaparin ) around procedure secondary to recurrent VTE.     Per Dr.Ross 04/16/2024 Pt seen in May 2025  Doing OK at time   Volume status good  Hx of Nonischemic cardiomyopathy with mod LV dysfunction Also concerning is Hx of recurrent PE   From cardiac standpoint pt is at some increaesed risk with arthroscopy but relatively low  Need to know plans for anticoagulation in periprocedure period given recurrent PE  The patient was advised that if she develops new symptoms prior to surgery to contact our office to arrange for a follow-up visit, and she verbalized understanding.  I will route this recommendation to the requesting party via Epic fax function and remove from pre-op pool.  Please call with questions.  Wyn Raddle, Jackee Shove, NP 04/22/2024, 11:07 AM

## 2024-04-23 NOTE — Telephone Encounter (Signed)
 I will forward this to preop APP.

## 2024-04-29 ENCOUNTER — Other Ambulatory Visit: Payer: Self-pay

## 2024-04-29 MED ORDER — CARVEDILOL 3.125 MG PO TABS: 3.1250 mg | ORAL_TABLET | Freq: Two times a day (BID) | ORAL | 2 refills | Status: AC

## 2024-04-30 ENCOUNTER — Encounter: Payer: Self-pay | Admitting: Internal Medicine

## 2024-05-01 ENCOUNTER — Telehealth: Payer: Self-pay | Admitting: Pharmacist

## 2024-05-01 ENCOUNTER — Other Ambulatory Visit (HOSPITAL_BASED_OUTPATIENT_CLINIC_OR_DEPARTMENT_OTHER): Payer: Self-pay | Admitting: Orthopaedic Surgery

## 2024-05-01 DIAGNOSIS — I480 Paroxysmal atrial fibrillation: Secondary | ICD-10-CM

## 2024-05-01 DIAGNOSIS — M25562 Pain in left knee: Secondary | ICD-10-CM

## 2024-05-01 MED ORDER — ENOXAPARIN SODIUM 150 MG/ML IJ SOSY: PREFILLED_SYRINGE | INTRAMUSCULAR | 0 refills | Status: DC

## 2024-05-01 NOTE — Telephone Encounter (Signed)
 Contacted patient regarding Lovenox  bridge   8/8: Last dose of Eliquis  in the evening  8/9: Inject enoxaparin  150mg   in the fatty abdominal tissue in the morning. No Eliquis   8/10: Inject enoxaparin  150mg  in the fatty tissue in the morning at 8 am. No Eliquis .  8/11: Procedure Day - No enoxaparin  - Resume Eliquis  in the evening or as directed by doctor

## 2024-05-09 ENCOUNTER — Ambulatory Visit: Payer: 59

## 2024-05-09 DIAGNOSIS — I428 Other cardiomyopathies: Secondary | ICD-10-CM | POA: Diagnosis not present

## 2024-05-10 ENCOUNTER — Encounter (HOSPITAL_BASED_OUTPATIENT_CLINIC_OR_DEPARTMENT_OTHER): Payer: Self-pay | Admitting: Orthopaedic Surgery

## 2024-05-10 LAB — CUP PACEART REMOTE DEVICE CHECK
Battery Remaining Longevity: 36 mo
Battery Remaining Percentage: 45 %
Brady Statistic RA Percent Paced: 0 %
Brady Statistic RV Percent Paced: 0 %
Date Time Interrogation Session: 20250717000200
HighPow Impedance: 54 Ohm
Implantable Lead Connection Status: 753985
Implantable Lead Connection Status: 753985
Implantable Lead Implant Date: 20090320
Implantable Lead Implant Date: 20090320
Implantable Lead Location: 753859
Implantable Lead Location: 753860
Implantable Lead Model: 158
Implantable Lead Model: 5076
Implantable Lead Serial Number: 182504
Implantable Pulse Generator Implant Date: 20160307
Lead Channel Impedance Value: 457 Ohm
Lead Channel Impedance Value: 514 Ohm
Lead Channel Pacing Threshold Amplitude: 0.4 V
Lead Channel Pacing Threshold Amplitude: 1.1 V
Lead Channel Pacing Threshold Pulse Width: 0.4 ms
Lead Channel Pacing Threshold Pulse Width: 0.4 ms
Lead Channel Setting Pacing Amplitude: 2 V
Lead Channel Setting Pacing Amplitude: 2.5 V
Lead Channel Setting Pacing Pulse Width: 0.4 ms
Lead Channel Setting Sensing Sensitivity: 0.6 mV
Pulse Gen Serial Number: 111826

## 2024-05-13 ENCOUNTER — Other Ambulatory Visit: Payer: Self-pay | Admitting: Medical Genetics

## 2024-05-14 ENCOUNTER — Ambulatory Visit (INDEPENDENT_AMBULATORY_CARE_PROVIDER_SITE_OTHER)

## 2024-05-14 ENCOUNTER — Telehealth (INDEPENDENT_AMBULATORY_CARE_PROVIDER_SITE_OTHER): Admitting: Family Medicine

## 2024-05-14 ENCOUNTER — Ambulatory Visit: Payer: Self-pay | Admitting: Family Medicine

## 2024-05-14 ENCOUNTER — Other Ambulatory Visit

## 2024-05-14 ENCOUNTER — Telehealth: Payer: Self-pay | Admitting: Internal Medicine

## 2024-05-14 ENCOUNTER — Encounter: Payer: Self-pay | Admitting: Family Medicine

## 2024-05-14 VITALS — BP 122/78 | HR 68 | Wt 221.0 lb

## 2024-05-14 DIAGNOSIS — R5383 Other fatigue: Secondary | ICD-10-CM | POA: Diagnosis not present

## 2024-05-14 DIAGNOSIS — Z86711 Personal history of pulmonary embolism: Secondary | ICD-10-CM

## 2024-05-14 DIAGNOSIS — R079 Chest pain, unspecified: Secondary | ICD-10-CM | POA: Diagnosis not present

## 2024-05-14 DIAGNOSIS — R0789 Other chest pain: Secondary | ICD-10-CM

## 2024-05-14 DIAGNOSIS — R634 Abnormal weight loss: Secondary | ICD-10-CM

## 2024-05-14 DIAGNOSIS — Z95810 Presence of automatic (implantable) cardiac defibrillator: Secondary | ICD-10-CM | POA: Diagnosis not present

## 2024-05-14 DIAGNOSIS — E890 Postprocedural hypothyroidism: Secondary | ICD-10-CM

## 2024-05-14 NOTE — Telephone Encounter (Signed)
 Pt says she is still having discomfort in chest   Pressure   Like when had clots    Not pleuritic    Hot and cold She has had visit with PCP  (Dr Jolinda)  CXR and labs ordered     Had covid test done

## 2024-05-14 NOTE — Telephone Encounter (Signed)
 Called the patient and did not receive an answer. I left a message stating to go to the emergency room if those symptoms have still persisted and that if she wants to call us  back we would be happy to direct her care.

## 2024-05-14 NOTE — Telephone Encounter (Signed)
  Per MyChart scheduling message:   Pt c/o of Chest Pain: STAT if active (IN THIS MOMENT) CP, including tightness, pressure, jaw pain, shoulder/upper arm/back pain, SOB, nausea, and vomiting.  1. Are you having CP right now (tightness, pressure, or discomfort)?   2. Are you experiencing any other symptoms (ex. SOB, nausea, vomiting, sweating)?   3. How long have you been experiencing CP?   4. Is your CP continuous or coming and going?   5. Have you taken Nitroglycerin?   6. If CP returns before callback, please consider calling 911. ?    Chest pressure and extremely fatigued. It's like light pressure and when I get up to do things I get a little winded. Had very faint headaches that come and got. When I get up to do think I notice I would get very hot . I don't normally run a lot of air because of being on blood thinners I stay cold but today I got so hot I had to have the air on and I sit here and fan myself for about 20 minutes before I cooled off. I dunno if it's heart related or just something fogging around. I've felt like this since last Wednesday. I don't not take nitroglycerin, have a small cough. Kind of feels like the times they found blood clots but I have been taking my eliquis

## 2024-05-14 NOTE — Progress Notes (Signed)
MyChart Video visit  Subjective: [id number] PCP: Jolinda Norene HERO, DO YEP:[name] [name] Thielman is a 63 y.o. female. Patient provides verbal consent for consult held via video.  Due to COVID-19 pandemic this visit was conducted virtually. This visit type was conducted due to national recommendations for restrictions regarding the COVID-19 Pandemic (e.g. social distancing, sheltering in place) in an effort to limit this patient's exposure and mitigate transmission in our community. All issues noted in this document were discussed and addressed.  A physical exam was not performed with this format.   Location of patient: home Location of provider: [facility] Others present for call: none  1. Fatigue She reports that she started having fatigue last Wednesday.  She reports poor appetite.  No congestion.  She reports light headaches but did not need to take meds for it.  She has been having hot flashes.  No sick contacts. No blood in stool.  Compliant with meds.  She is having a heaviness in her chest that is similar to when she had her blood clots.  She is not missed any of her anticoagulant.  She reports no shortness of breath.  She reports normal vital signs at home including oxygen  saturation.  She has not self tested for COVID but does not feel like she did when she had it the previous few times.  Additionally she cannot take the medication for COVID that is required because of multiple drug interactions with her heart medicines   ROS: Per HPI  Allergies  Allergen Reactions   Avelox [Moxifloxacin Hcl In Nacl] Shortness Of Breath    Shortness of breath   Clindamycin/Lincomycin Shortness Of Breath and Diarrhea    Breathing/diarrhea     Kiwi Extract Anaphylaxis    Makes pt. Feel like her throat is closing    Bactrim [Sulfamethoxazole-Trimethoprim] Swelling   Levofloxacin Other (See Comments)    JOINT PAIN JOINT PAIN   Simvastatin Other (See Comments)    Joint pain   Spironolactone  Other (See  Comments)    burning   Moxifloxacin Hcl Other (See Comments)   Tramadol  Nausea Only   Past Medical History:  Diagnosis Date   A-fib (HCC)    Acute deep vein thrombosis (DVT) of right popliteal vein (HCC) 04/13/2022   Acute pulmonary embolism (HCC) 04/12/2022   AICD (automatic cardioverter/defibrillator) present 2009   [employer]- replaced 2016   Arthritis    Bradycardia    Breast cancer (HCC) 02/24/2016   s/p lumpectomy and radiation but refused chemotherapy   Chronic systolic CHF (congestive heart failure) (HCC)    Cough due to angiotensin-converting enzyme inhibitor    Deep venous thrombosis of lower extremity (HCC) 05/24/2023   Dizziness    Dizziness with standing after squatting, March, 2012   DVT (deep venous thrombosis) (HCC)    Dyslipidemia    LDL elevation,Patient does not want statin   Family history of breast cancer    GERD (gastroesophageal reflux disease)    H/O shortness of breath    CPX 10/18:  No evidence of cardiopulmonary limitation. Suspect exercise intolerance related to weight and deconditioning.    Hypothyroidism    ICD (implantable cardiac defibrillator) battery depletion    Dr. Fernande, June, 2009(artifactual atrial tachycardia response events addressed by reprogramming in parentheses   Leg swelling    right leg   Mitral regurgitation    Nonischemic cardiomyopathy (HCC)    Etiology unknown, diagnosed 2008   Paroxysmal atrial fibrillation (HCC) 06/20/2022   PE (pulmonary thromboembolism) (HCC)  Personal history of radiation therapy 2017   Pneumonia due to COVID-19 virus 10/2019   Pulmonary embolism (HCC) 04/21/2020   TIA (transient ischemic attack)    No CT or MRI abnormality, aspirin  therapy   TIA (transient ischemic attack) 2007    Current Outpatient Medications:    acetaminophen  (TYLENOL ) 500 MG tablet, Take 500 mg by mouth every 6 (six) hours as needed for mild pain or moderate pain., Disp: , Rfl:    apixaban  (ELIQUIS ) 5 MG TABS tablet,  Take 1 tablet (5 mg total) by mouth 2 (two) times daily., Disp: 180 tablet, Rfl: 4   carvedilol  (COREG ) 3.125 MG tablet, Take 1 tablet (3.125 mg total) by mouth 2 (two) times daily. NEEDS FOLLOW UP APPOINTMENT FOR ANYMORE REFILLS, Disp: 180 tablet, Rfl: 2   enoxaparin  (LOVENOX ) 150 MG/ML injection, Inject 150mg  SQ in the morning for 2 doses, Disp: 2 mL, Rfl: 0   eplerenone  (INSPRA ) 25 MG tablet, TAKE 1 TABLET BY MOUTH ONCE DAILY., Disp: 90 tablet, Rfl: 2   famotidine  (PEPCID ) 20 MG tablet, One after supper, Disp: 90 tablet, Rfl: 2   fluticasone  (FLONASE ) 50 MCG/ACT nasal spray, Place 2 sprays into both nostrils daily., Disp: 16 g, Rfl: 6   levothyroxine  (SYNTHROID ) 75 MCG tablet, Take 1 tablet (75 mcg total) by mouth daily before breakfast., Disp: 90 tablet, Rfl: 4   linaclotide  (LINZESS ) 290 MCG CAPS capsule, Take 1 capsule (290 mcg total) by mouth daily before breakfast., Disp: 30 capsule, Rfl: 5   loratadine (CLARITIN) 10 MG tablet, Take 10 mg by mouth as needed., Disp: , Rfl:    pantoprazole  (PROTONIX ) 40 MG tablet, Take 1 tablet (40 mg total) by mouth daily before breakfast. Take 30-60 min before first meal of the day, Disp: 90 tablet, Rfl: 2   rosuvastatin  (CRESTOR ) 10 MG tablet, Take 1 tablet (10 mg total) by mouth daily., Disp: 90 tablet, Rfl: 3   valsartan  (DIOVAN ) 80 MG tablet, Take 1 tablet by mouth twice daily, Disp: 60 tablet, Rfl: 0  Blood pressure 122/78, pulse 68, weight 221 lb (100.2 kg), SpO2 98%. Gen: nontoxic appearing female, NAD Pulm: normal WOB on room air  Assessment/ Plan: 63 y.o. female   Fatigue, unspecified type - Plan: TSH + free T4, CBC with Differential/Platelet, Vitamin B12, CMP14+EGFR, DG Chest 2 View, Novel Coronavirus, NAA ([facility]), D-dimer, quantitative  Unexplained weight loss - Plan: DG Chest 2 View  Sensation of chest pressure - Plan: DG Chest 2 View, D-dimer, quantitative  Post-surgical hypothyroidism - Plan: TSH + free T4  History of pulmonary  embolism - Plan: D-dimer, quantitative  Will order multiple labs to evaluate for possible metabolic etiology.  She felt like the chest pressure that she is experiencing is similar to when she had her pulmonary embolism and has been totally compliant with her anticoagulant.  I did go ahead and add D-dimer but I do not think this will have a high yield.  Will check for COVID given reemergence recently in the community.  Chest x-ray also ordered and radiology department aware that she will be coming in the next hour.  Further interventions pending results but discussed that if there is nothing to explain symptoms from today's orders neck step will be CT of the chest given 10lb weight loss in last month with normal colonoscopy recently  Start time: 9:21a End time: 9:31a  Total time spent on patient care (including video visit/ documentation): 15 minutes  Osamu Olguin CHRISTELLA Fielding, DO [facility] [phone])  [phone]   

## 2024-05-15 ENCOUNTER — Other Ambulatory Visit: Payer: Self-pay | Admitting: Family Medicine

## 2024-05-15 DIAGNOSIS — R634 Abnormal weight loss: Secondary | ICD-10-CM

## 2024-05-15 DIAGNOSIS — R0789 Other chest pain: Secondary | ICD-10-CM

## 2024-05-15 DIAGNOSIS — R5383 Other fatigue: Secondary | ICD-10-CM

## 2024-05-15 LAB — CMP14+EGFR
ALT: 16 IU/L (ref 0–32)
AST: 14 IU/L (ref 0–40)
Albumin: 4.3 g/dL (ref 3.9–4.9)
Alkaline Phosphatase: 54 IU/L (ref 44–121)
BUN/Creatinine Ratio: 19 (ref 12–28)
BUN: 17 mg/dL (ref 8–27)
Bilirubin Total: 0.3 mg/dL (ref 0.0–1.2)
CO2: 22 mmol/L (ref 20–29)
Calcium: 9.4 mg/dL (ref 8.7–10.3)
Chloride: 105 mmol/L (ref 96–106)
Creatinine, Ser: 0.9 mg/dL (ref 0.57–1.00)
Globulin, Total: 2.3 g/dL (ref 1.5–4.5)
Glucose: 91 mg/dL (ref 70–99)
Potassium: 4.5 mmol/L (ref 3.5–5.2)
Sodium: 142 mmol/L (ref 134–144)
Total Protein: 6.6 g/dL (ref 6.0–8.5)
eGFR: 71 mL/min/1.73 (ref >59)

## 2024-05-15 LAB — CBC WITH DIFFERENTIAL/PLATELET
Basophils Absolute: 0 x10E3/uL (ref 0.0–0.2)
Basos: 1 %
EOS (ABSOLUTE): 0 x10E3/uL (ref 0.0–0.4)
Eos: 1 %
Hematocrit: 38 % (ref 34.0–46.6)
Hemoglobin: 12.2 g/dL (ref 11.1–15.9)
Immature Grans (Abs): 0 x10E3/uL (ref 0.0–0.1)
Immature Granulocytes: 0 %
Lymphocytes Absolute: 1.8 x10E3/uL (ref 0.7–3.1)
Lymphs: 36 %
MCH: 31.1 pg (ref 26.6–33.0)
MCHC: 32.1 g/dL (ref 31.5–35.7)
MCV: 97 fL (ref 79–97)
Monocytes Absolute: 0.4 x10E3/uL (ref 0.1–0.9)
Monocytes: 7 %
Neutrophils Absolute: 2.7 x10E3/uL (ref 1.4–7.0)
Neutrophils: 55 %
Platelets: 212 x10E3/uL (ref 150–450)
RBC: 3.92 x10E6/uL (ref 3.77–5.28)
RDW: 12.8 % (ref 11.7–15.4)
WBC: 5 x10E3/uL (ref 3.4–10.8)

## 2024-05-15 LAB — NOVEL CORONAVIRUS, NAA: SARS-CoV-2, NAA: NOT DETECTED

## 2024-05-15 LAB — D-DIMER, QUANTITATIVE: D-DIMER: 0.2 mg{FEU}/L (ref 0.00–0.49)

## 2024-05-15 LAB — TSH+FREE T4
Free T4: 1.65 ng/dL (ref 0.82–1.77)
TSH: 1.19 u[IU]/mL (ref 0.450–4.500)

## 2024-05-15 LAB — VITAMIN B12: Vitamin B-12: 405 pg/mL (ref 232–1245)

## 2024-05-20 ENCOUNTER — Ambulatory Visit
Admission: RE | Admit: 2024-05-20 | Discharge: 2024-05-20 | Disposition: A | Discharge status: Home / Self Care | Source: Ambulatory Visit | Attending: Family Medicine | Admitting: Family Medicine

## 2024-05-20 ENCOUNTER — Other Ambulatory Visit: Payer: Self-pay | Admitting: Family Medicine

## 2024-05-20 ENCOUNTER — Ambulatory Visit (HOSPITAL_COMMUNITY)
Admission: RE | Admit: 2024-05-20 | Discharge: 2024-05-20 | Disposition: A | Discharge status: Home / Self Care | Source: Ambulatory Visit | Attending: Family Medicine | Admitting: Family Medicine

## 2024-05-20 DIAGNOSIS — R0789 Other chest pain: Secondary | ICD-10-CM

## 2024-05-20 DIAGNOSIS — E041 Nontoxic single thyroid nodule: Secondary | ICD-10-CM | POA: Diagnosis not present

## 2024-05-20 DIAGNOSIS — R5383 Other fatigue: Secondary | ICD-10-CM | POA: Insufficient documentation

## 2024-05-20 DIAGNOSIS — R918 Other nonspecific abnormal finding of lung field: Secondary | ICD-10-CM | POA: Diagnosis not present

## 2024-05-20 DIAGNOSIS — R634 Abnormal weight loss: Secondary | ICD-10-CM | POA: Diagnosis not present

## 2024-05-20 DIAGNOSIS — Z0000 Encounter for general adult medical examination without abnormal findings: Secondary | ICD-10-CM

## 2024-05-20 DIAGNOSIS — Z1231 Encounter for screening mammogram for malignant neoplasm of breast: Secondary | ICD-10-CM | POA: Diagnosis not present

## 2024-05-20 MED ORDER — IOHEXOL 300 MG/ML  SOLN
75.0000 mL | Freq: Once | INTRAMUSCULAR | Status: AC | PRN
Start: 2024-05-20 — End: 2024-05-20
  Administered 2024-05-20: 75 mL via INTRAVENOUS

## 2024-05-21 ENCOUNTER — Ambulatory Visit: Payer: Self-pay | Admitting: Family Medicine

## 2024-05-21 ENCOUNTER — Other Ambulatory Visit: Payer: Self-pay | Admitting: Internal Medicine

## 2024-05-21 DIAGNOSIS — R918 Other nonspecific abnormal finding of lung field: Secondary | ICD-10-CM

## 2024-05-21 DIAGNOSIS — Z86718 Personal history of other venous thrombosis and embolism: Secondary | ICD-10-CM

## 2024-05-21 NOTE — Telephone Encounter (Signed)
 Prescription refill request for Eliquis  received. Indication:afib Last office visit:5/25 Scr:0.90  7/25 Age: 63 Weight:100.2  kg  Prescription refilled

## 2024-05-22 ENCOUNTER — Encounter: Payer: Self-pay | Admitting: Internal Medicine

## 2024-05-22 DIAGNOSIS — I480 Paroxysmal atrial fibrillation: Secondary | ICD-10-CM

## 2024-05-22 DIAGNOSIS — I5042 Chronic combined systolic (congestive) and diastolic (congestive) heart failure: Secondary | ICD-10-CM

## 2024-05-22 DIAGNOSIS — I10 Essential (primary) hypertension: Secondary | ICD-10-CM

## 2024-05-24 NOTE — Progress Notes (Signed)
 Called Dr. Ollis office (April) and left a message stating that patient will need to have surgery at the main hospital due to having an ICD.

## 2024-05-24 NOTE — Telephone Encounter (Signed)
 I saw the CT scan   Nothing to explain symtpoms  Few small nodules  I would recomm she have CBC, BMET and BNP checked

## 2024-06-03 ENCOUNTER — Ambulatory Visit (HOSPITAL_BASED_OUTPATIENT_CLINIC_OR_DEPARTMENT_OTHER): Admission: RE | Admit: 2024-06-03 | Source: Home / Self Care | Admitting: Orthopaedic Surgery

## 2024-06-03 ENCOUNTER — Encounter (HOSPITAL_BASED_OUTPATIENT_CLINIC_OR_DEPARTMENT_OTHER): Admission: RE | Payer: Self-pay | Source: Home / Self Care

## 2024-06-03 DIAGNOSIS — I480 Paroxysmal atrial fibrillation: Secondary | ICD-10-CM | POA: Diagnosis not present

## 2024-06-03 DIAGNOSIS — I10 Essential (primary) hypertension: Secondary | ICD-10-CM | POA: Diagnosis not present

## 2024-06-03 DIAGNOSIS — I5042 Chronic combined systolic (congestive) and diastolic (congestive) heart failure: Secondary | ICD-10-CM | POA: Diagnosis not present

## 2024-06-03 SURGERY — ARTHROSCOPY, KNEE, WITH MENISCUS REPAIR
Anesthesia: General | Site: Knee | Laterality: Left

## 2024-06-04 ENCOUNTER — Ambulatory Visit: Payer: Self-pay | Admitting: Internal Medicine

## 2024-06-04 LAB — BASIC METABOLIC PANEL WITH GFR
BUN/Creatinine Ratio: 11 — ABNORMAL LOW (ref 12–28)
BUN: 10 mg/dL (ref 8–27)
CO2: 24 mmol/L (ref 20–29)
Calcium: 9.3 mg/dL (ref 8.7–10.3)
Chloride: 105 mmol/L (ref 96–106)
Creatinine, Ser: 0.94 mg/dL (ref 0.57–1.00)
Glucose: 90 mg/dL (ref 70–99)
Potassium: 4.3 mmol/L (ref 3.5–5.2)
Sodium: 143 mmol/L (ref 134–144)
eGFR: 68 mL/min/1.73 (ref >59)

## 2024-06-04 LAB — CBC
Hematocrit: 35 % (ref 34.0–46.6)
Hemoglobin: 10.7 g/dL — ABNORMAL LOW (ref 11.1–15.9)
MCH: 29 pg (ref 26.6–33.0)
MCHC: 30.6 g/dL — ABNORMAL LOW (ref 31.5–35.7)
MCV: 95 fL (ref 79–97)
Platelets: 219 x10E3/uL (ref 150–450)
RBC: 3.69 x10E6/uL — ABNORMAL LOW (ref 3.77–5.28)
RDW: 12.5 % (ref 11.7–15.4)
WBC: 6 x10E3/uL (ref 3.4–10.8)

## 2024-06-04 LAB — PRO B NATRIURETIC PEPTIDE: NT-Pro BNP: 225 pg/mL (ref 0–287)

## 2024-06-04 NOTE — Progress Notes (Signed)
 Cardiology Office Note:    Date:  06/07/2024   ID:  [name] [name], DOB [birth year], MRN [id number]  PCP:  Jolinda Norene CHRISTELLA, DO   Pt presents for follow up of HFrEF  History of Present Illness:    [name] [name] is a 63 y.o. female with a hx of HFrEF (s/p ICD), HL, TIA, GERD, breast cancer (s/p lumpectomy and XRT - refused chemo), hypothyroidism,  2007  Dx HFrEF  LHC in 2017 with normal cors and EF 25-30%. S/p s-ICD. Medical therapy has been limited by multiple medication intolerances (Spironolactone  caused body burning sensation, could not afford Bidil and had fatigue with high does of Entresto ).  April 2022 EF 30-35%, RV systolic fxn moderately reduced.   June 2023  Admitted with chest pain and SOB. CT chest showed small RLL PE. Started on -  Eliquis . Echo showed EF 35-40%, RV OK.   I saw the pt in May 2025 Since seen she says she had some SOB /chest pressure earlier this summer   That is better now . Has occasional dizziness with quick standing      Past Medical History:  Diagnosis Date   A-fib (HCC)    Acute deep vein thrombosis (DVT) of right popliteal vein (HCC) 04/13/2022   Acute pulmonary embolism (HCC) 04/12/2022   AICD (automatic cardioverter/defibrillator) present 2009   [employer]- replaced 2016   Arthritis    Bradycardia    Breast cancer (HCC) 02/24/2016   s/p lumpectomy and radiation but refused chemotherapy   Chronic systolic CHF (congestive heart failure) (HCC)    Cough due to angiotensin-converting enzyme inhibitor    Deep venous thrombosis of lower extremity (HCC) 05/24/2023   Dizziness    Dizziness with standing after squatting, March, 2012   DVT (deep venous thrombosis) (HCC)    Dyslipidemia    LDL elevation,Patient does not want statin   Family history of breast cancer    GERD (gastroesophageal reflux disease)    H/O shortness of breath    CPX 10/18:  No evidence of cardiopulmonary limitation. Suspect exercise intolerance related to weight and  deconditioning.    Hypothyroidism    ICD (implantable cardiac defibrillator) battery depletion    Dr. Fernande, June, 2009(artifactual atrial tachycardia response events addressed by reprogramming in parentheses   Leg swelling    right leg   Mitral regurgitation    Nonischemic cardiomyopathy (HCC)    Etiology unknown, diagnosed 2008   Paroxysmal atrial fibrillation (HCC) 06/20/2022   PE (pulmonary thromboembolism) (HCC)    Personal history of radiation therapy 2017   Pneumonia due to COVID-19 virus 10/2019   Pulmonary embolism (HCC) 04/21/2020   TIA (transient ischemic attack)    No CT or MRI abnormality, aspirin  therapy   TIA (transient ischemic attack) 2007    Past Surgical History:  Procedure Laterality Date   BREAST BIOPSY Left 02/24/2016   U/S Core   BREAST BIOPSY Left 02/24/2016   U/S Core   BREAST LUMPECTOMY Left 04/05/2016   invasive ductal    BREAST LUMPECTOMY WITH RADIOACTIVE SEED AND SENTINEL LYMPH NODE BIOPSY Left 04/05/2016   Procedure: BREAST LUMPECTOMY WITH RADIOACTIVE SEED AND SENTINEL LYMPH NODE BIOPSY;  Surgeon: Alm Angle, MD;  Location: [facility] OR;  Service: General;  Laterality: Left;   CARDIAC CATHETERIZATION N/A 08/05/2016   Procedure: Left Heart Cath and Coronary Angiography;  Surgeon: Peter M Swaziland, MD;  Location: [facility] INVASIVE CV LAB;  Service: Cardiovascular;  Laterality: N/A;   CARDIAC DEFIBRILLATOR PLACEMENT  09  [employer] no remote   COLONOSCOPY     COLONOSCOPY N/A 01/19/2024   Procedure: COLONOSCOPY;  Surgeon: Cindie Carlin POUR, DO;  Location: [facility];  Service: Endoscopy;  Laterality: N/A;  11:00 am, asa 3/4   CYST EXCISION Left    back of leg-lt    ESOPHAGOGASTRODUODENOSCOPY N/A 01/19/2024   Procedure: EGD (ESOPHAGOGASTRODUODENOSCOPY);  Surgeon: Cindie Carlin POUR, DO;  Location: [facility];  Service: Endoscopy;  Laterality: N/A;   IMPLANTABLE CARDIOVERTER DEFIBRILLATOR GENERATOR CHANGE N/A 12/29/2014   Procedure: IMPLANTABLE CARDIOVERTER  DEFIBRILLATOR GENERATOR CHANGE;  Surgeon: Elspeth JAYSON Sage, MD;  Location: [facility] CATH LAB;  Service: Cardiovascular;  Laterality: N/A;   LATERAL EPICONDYLE RELEASE  10/11/2012   Procedure: TENNIS ELBOW RELEASE;  Surgeon: Toribio JULIANNA Chancy, MD;  Location: [facility];  Service: Orthopedics;  Laterality: Right;  RIGHT ELBOW: TENOTOMY ELBOW LATERAL EPICONDYLITIS TENNIS ELBOW: RADIAL TUNNEL RELEASE   POLYPECTOMY  01/19/2024   Procedure: POLYPECTOMY;  Surgeon: Cindie Carlin POUR, DO;  Location: [facility];  Service: Endoscopy;;   PORT-A-CATH REMOVAL  04/13/2016   Procedure: MINOR REMOVAL PORT-A-CATH;  Surgeon: Donnice Lima, MD;  Location:  SURGERY CENTER;  Service: General;;   PORTACATH PLACEMENT Right 04/05/2016   Procedure: INSERTION PORT-A-CATH WITH US ;  Surgeon: Alm Angle, MD;  Location: [facility] OR;  Service: General;  Laterality: Right;  right IJ   TOTAL THYROIDECTOMY  2001   TUBAL LIGATION  94   UPPER GI ENDOSCOPY      Current Medications: Current Meds  Medication Sig   apixaban  (ELIQUIS ) 5 MG TABS tablet Take 1 tablet by mouth twice daily   carvedilol  (COREG ) 3.125 MG tablet Take 1 tablet (3.125 mg total) by mouth 2 (two) times daily. NEEDS FOLLOW UP APPOINTMENT FOR ANYMORE REFILLS   eplerenone  (INSPRA ) 25 MG tablet TAKE 1 TABLET BY MOUTH ONCE DAILY.   famotidine  (PEPCID ) 20 MG tablet One after supper   fluticasone  (FLONASE ) 50 MCG/ACT nasal spray Place 2 sprays into both nostrils daily.   levothyroxine  (SYNTHROID ) 75 MCG tablet Take 1 tablet (75 mcg total) by mouth daily before breakfast.   linaclotide  (LINZESS ) 290 MCG CAPS capsule Take 1 capsule (290 mcg total) by mouth daily before breakfast.   loratadine (CLARITIN) 10 MG tablet Take 10 mg by mouth as needed.   pantoprazole  (PROTONIX ) 40 MG tablet Take 1 tablet (40 mg total) by mouth daily before breakfast. Take 30-60 min before first meal of the day   rosuvastatin  (CRESTOR ) 10 MG tablet Take 1 tablet (10 mg total) by  mouth daily.   traMADol  (ULTRAM ) 50 MG tablet Take 50 mg by mouth every 6 (six) hours as needed.   valsartan  (DIOVAN ) 80 MG tablet Take 1 tablet by mouth twice daily     Allergies:   Avelox [moxifloxacin hcl in nacl], Clindamycin/lincomycin, Kiwi extract, Bactrim [sulfamethoxazole-trimethoprim], Levofloxacin, Simvastatin, Spironolactone , Moxifloxacin hcl, and Tramadol    Social History   Socioeconomic History   Marital status: Widowed    Spouse name: Not on file   Number of children: Not on file   Years of education: Not on file   Highest education level: 12th grade  Occupational History   Occupation: Widow  Tobacco Use   Smoking status: Never   Smokeless tobacco: Never  Vaping Use   Vaping status: Never Used  Substance and Sexual Activity   Alcohol use: No   Drug use: No   Sexual activity: Not Currently    Birth control/protection: Surgical  Comment: tubal  Other Topics Concern   Not on file  Social History Narrative   Not on file   Social Drivers of Health   Financial Resource Strain: Medium Risk (05/13/2024)   Overall Financial Resource Strain (CARDIA)    Difficulty of Paying Living Expenses: Somewhat hard  Food Insecurity: Food Insecurity Present (05/13/2024)   Hunger Vital Sign    Worried About Running Out of Food in the Last Year: Patient declined    Ran Out of Food in the Last Year: Sometimes true  Transportation Needs: No Transportation Needs (05/13/2024)   PRAPARE - Administrator, Civil Service (Medical): No    Lack of Transportation (Non-Medical): No  Physical Activity: Insufficiently Active (05/13/2024)   Exercise Vital Sign    Days of Exercise per Week: 2 days    Minutes of Exercise per Session: 20 min  Stress: No Stress Concern Present (05/13/2024)   [employer] of Occupational Health - Occupational Stress Questionnaire    Feeling of Stress: Only a little  Social Connections: Moderately Integrated (05/13/2024)   Social Connection and  Isolation Panel    Frequency of Communication with Friends and Family: More than three times a week    Frequency of Social Gatherings with Friends and Family: Three times a week    Attends Religious Services: More than 4 times per year    Active Member of Clubs or Organizations: Yes    Attends Banker Meetings: More than 4 times per year    Marital Status: Widowed     Family History: The patient's family history includes Breast cancer in her maternal aunt; Breast cancer (age of onset: 55) in her mother; Cancer in her maternal aunt, mother, and paternal grandmother; Heart attack in her mother; Hypertension in her brother and sister. There is no history of Stroke, Diabetes, or Colon cancer.     EKGs/Labs/Other Studies Reviewed:    The following studies were reviewed today:  Echo  04/20/2022:  1. Left ventricular ejection fraction, by estimation, is 35 to 40%. The  left ventricle has moderately decreased function. The left ventricle  demonstrates global hypokinesis. Left ventricular diastolic parameters are  consistent with Grade I diastolic  dysfunction (impaired relaxation).   2. The mitral valve is normal in structure. Mild mitral valve  regurgitation.   3. The aortic valve is tricuspid. Aortic valve regurgitation is not  visualized.   4. The inferior vena cava is normal in size with greater than 50%  respiratory variability, suggesting right atrial pressure of 3 mmHg.   Comparison(s): No significant change from prior study. Prior images  reviewed side by side.   CTA Chest  04/12/2022: FINDINGS: Cardiovascular: Heart is enlarged in size. Thoracic aorta demonstrates atherosclerotic calcifications without aneurysmal dilatation or dissection. Pulmonary artery demonstrates a normal branching pattern. There are a few filling defects identified within the right lower lobe pulmonary artery consistent with pulmonary emboli. No evidence of right heart strain is noted. No  other filling defects are seen. Pacing device is noted.   Mediastinum/Nodes: Thoracic inlet is within normal limits. Changes of prior thyroid  surgery are noted. No hilar or mediastinal adenopathy is seen. The esophagus as visualized is within normal limits.   Lungs/Pleura: Lungs are well aerated bilaterally. No focal infiltrate or sizable effusion is seen. No sizable parenchymal nodules are noted.   Upper Abdomen: Visualized upper abdomen is unremarkable.   Musculoskeletal: Degenerative changes of the thoracic spine are seen. No rib abnormality is noted.  Review of the MIP images confirms the above findings.   IMPRESSION: Small pulmonary emboli within the right lower lobe as described. No right heart strain is noted.   No other acute abnormality is noted.   Critical Value/emergent results were called by telephone at the time of interpretation on 04/12/2022 at 9:57 pm to [facility] , who verbally acknowledged these results.   Aortic Atherosclerosis (ICD10-I70.0).  TTE 01/2021: IMPRESSIONS   1. Left ventricular ejection fraction, by estimation, is 30 to 35%. The  left ventricle has moderately decreased function. The left ventricle has  no regional wall motion abnormalities. The left ventricular internal  cavity size was mildly dilated. Left  ventricular diastolic parameters are consistent with Grade I diastolic  dysfunction (impaired relaxation).   2. Right ventricular systolic function is moderately reduced. The right  ventricular size is normal. There is normal pulmonary artery systolic  pressure.   3. Left atrial size was mildly dilated.   4. The mitral valve is normal in structure. Trivial mitral valve  regurgitation. No evidence of mitral stenosis.   5. The aortic valve is normal in structure. Aortic valve regurgitation is  not visualized. No aortic stenosis is present.   6. The inferior vena cava is normal in size with greater than 50%  respiratory  variability, suggesting right atrial pressure of 3 mmHg.    CPX 12/19/19: Exercise Time:    11:00   Speed (mph): 3.0       Grade (%): 7.5     RPE: 17  Reason stopped: dyspnea (7/10)  Resting HR: 85 Standing HR: 86 Peak HR: 161   (100% age predicted max HR)  BP rest: 128/84 Standing BP: 116/82 BP peak: 166/72  Peak VO2: 17.7 (97%% predicted peak VO2)  VE/VCO2 slope:  29  OUES: 2.02  Peak RER: 1.08  Ventilatory Threshold: 13.0 (71% predicted or measured peak VO2)  Peak RR 38  Peak Ventilation:  56.8  VE/MVV:  54%  ETCO2 at peak:  38  O2pulse:  11   (100% predicted O2pulse)    Interpretation   Notes: Patient gave a very good effort. Pulse-oximetry remained 97-99% for the duration of exercise.   ECG:  Resting ECG in sinus rhythm with occasional PVCs. HR response appropriate. There were frequent PVCs during exercise with no sustained arrhythmias or ST-T changes.   PFT:  Pre-exercise spirometry was within normal limits. The MVV was normal.   CPX:  Exercise testing with gas exchange demonstrates a normal peak VO2 of 17.7 ml/kg/min (97% of the age/gender/weight matched sedentary norms). The RER of 1.08 indicates a near maximal effort. When adjusted to the patient's ideal body weight of 153.1 lb (69.5 kg) the peak VO2 is 24.7 ml/kg (ibw)/min (108% of the ibw-adjusted predicted). The VE/VCO2 slope is normal. The oxygen  uptake efficiency slope (OUES) is normal. The VO2 at the ventilatory threshold was normal at 71% of the predicted peak VO2. At peak exercise, the ventilation reached 54% of the measured MVV indicating ventilatory reserve remained. The O2pulse (a surrogate for stroke volume) increased with incremental exercise, reaching peak 11 ml/beat (100% predicted).    Conclusion: Exercise testing with gas exchange demonstrates normal functional capacity when compared to matched sedentary norms. There is no clear cardiopulmonary limitation. With corrections for ideal body weight, patient's  body habitus appears the limiting factor. Overall CPX essentially unchanged from 2018.  Cath 2017: There is severe left ventricular systolic dysfunction. LV end diastolic pressure is normal. The left ventricular ejection fraction  is 25-35% by visual estimate.   1. Normal coronary anatomy 2. Severe LV dysfunction 3. Elevated LV EDP   Plan: medical management.     EKG:  EKG not done today   Recent Labs: 06/21/2023: Magnesium  2.1 05/14/2024: ALT 16; TSH 1.190 06/03/2024: BUN 10; Creatinine, Ser 0.94; Hemoglobin 10.7; NT-Pro BNP 225; Platelets 219; Potassium 4.3; Sodium 143   Recent Lipid Panel    Component Value Date/Time   CHOL 237 (H) 06/21/2023 1304   TRIG 72 06/21/2023 1304   HDL 91 06/21/2023 1304   CHOLHDL 2.6 06/21/2023 1304   LDLCALC 134 (H) 06/21/2023 1304     Physical Exam:    VS:  BP 100/72 (BP Location: Right Arm, Patient Position: Sitting)   Pulse 80   Ht 5' 7.5 (1.715 m)   Wt 224 lb 12.8 oz (102 kg)   SpO2 97%   BMI 34.69 kg/m     Wt Readings from Last 3 Encounters:  06/07/24 224 lb 12.8 oz (102 kg)  05/14/24 221 lb (100.2 kg)  04/02/24 231 lb 9.6 oz (105.1 kg)     GEN: Obese 63 yo in NAD NECK: JVP is not elevated   No bruits  CARDIAC: RRR  No S3  No  murmurs RESPIRATORY:  Clear to auscultation  ABDOMEN: Soft, non-tender,   No hepatomegaly Ext  No LE edema   PLAN:     #PAF    Pt denies palpitations      Keep on Eliquis , carvedilol      Take additional b blocker if develops palptations   #HFrEF Nonischemic    Last echo in 2023 LVEF 35 to 40%    Mild MR Had some chest tightness, SOB earlier this summer  Doing better now  Volume status looks good    Keep on current regimen   (Entresto , carvedilol , eplereone) -Off laisx -No farxiga to given history of yeast infections -S/p ICD placement  #Recurrent PE: -Continue lifelong apixaban  5mg  BID  #HTN:  - BP is low normal    She notes dizziness with quick standing    I would recomm cutting back  on Valsartan  to 80 mg daily    Follow BP at home   #HLD: -taking crestor .  Has not tolerated prava or simva -felt Zetia  lead to to too much urination  -Lasat lipids LDL 91  HDL 88  FOllow   # Metabolic    Last J8R 6.0 in May 2025  Reviewed diet   Cut back on UPF and carbs   Stay active      Medication Adjustments/Labs and Tests Ordered: Current medicines are reviewed at length with the patient today.  Concerns regarding medicines are outlined above.   No orders of the defined types were placed in this encounter.  No orders of the defined types were placed in this encounter.  There are no Patient Instructions on file for this visit.     Signed, Vina Gull, MD 06/07/2024   [facility]

## 2024-06-06 ENCOUNTER — Ambulatory Visit: Admitting: Physical Therapy

## 2024-06-07 ENCOUNTER — Encounter: Payer: Self-pay | Admitting: Internal Medicine

## 2024-06-07 ENCOUNTER — Ambulatory Visit: Attending: Internal Medicine | Admitting: Internal Medicine

## 2024-06-07 VITALS — BP 100/72 | HR 80 | Ht 67.5 in | Wt 224.8 lb

## 2024-06-07 DIAGNOSIS — I5020 Unspecified systolic (congestive) heart failure: Secondary | ICD-10-CM

## 2024-06-07 MED ORDER — VALSARTAN 80 MG PO TABS: 80.0000 mg | ORAL_TABLET | Freq: Every day | ORAL | Status: DC

## 2024-06-07 NOTE — Patient Instructions (Signed)
 Medication Instructions:  Your physician has recommended you make the following change in your medication:   1) DECREASE valsartan  to 80 mg once daily  *If you need a refill on your cardiac medications before your next appointment, please call your pharmacy*  Follow-Up: At [facility], you and your health needs are our priority.  As part of our continuing mission to provide you with exceptional heart care, our providers are all part of one team.  This team includes your primary Cardiologist (physician) and Advanced Practice Providers or APPs (Physician Assistants and Nurse Practitioners) who all work together to provide you with the care you need, when you need it.  Your next appointment:   6-7 month(s)  Provider:   Vina Gull, MD   We recommend signing up for the patient portal called MyChart.  Sign up information is provided on this After Visit Summary.  MyChart is used to connect with patients for Virtual Visits (Telemedicine).  Patients are able to view lab/test results, encounter notes, upcoming appointments, etc.  Non-urgent messages can be sent to your provider as well.    To learn more about what you can do with MyChart, go to [email or web address].   Other Instructions Please check your blood pressure at home, at least 2 hours after taking your blood pressure medication, and keep a log.   Blood Pressure Record Sheet To take your blood pressure, you will need a blood pressure machine. You can buy a blood pressure machine (blood pressure monitor) at your clinic, drug store, or online. When choosing one, consider: An automatic monitor that has an arm cuff. A cuff that wraps snugly around your upper arm. You should be able to fit only one finger between your arm and the cuff. A device that stores blood pressure reading results. Do not choose a monitor that measures your blood pressure from your wrist or finger. Follow your health care provider's instructions for how  to take your blood pressure. To use this form: Take your blood pressure medications every day These measurements should be taken when you have been at rest for at least 10-15 min Take at least 2 readings with each blood pressure check. This makes sure the results are correct. Wait 1-2 minutes between measurements. Write down the results in the spaces on this form. Keep in mind it should always be recorded systolic over diastolic. Both numbers are important.  Repeat this every day for 2-3 weeks, or as told by your health care provider.  Make a follow-up appointment with your health care provider to discuss the results.   Blood Pressure Log Date Medications taken? (Y/N) Blood Pressure Time of Day

## 2024-06-13 ENCOUNTER — Other Ambulatory Visit (HOSPITAL_COMMUNITY)

## 2024-06-17 ENCOUNTER — Other Ambulatory Visit (HOSPITAL_COMMUNITY)
Admission: RE | Admit: 2024-06-17 | Discharge: 2024-06-17 | Disposition: A | Discharge status: Home / Self Care | Payer: Self-pay | Source: Ambulatory Visit | Attending: Medical Genetics | Admitting: Medical Genetics

## 2024-06-17 ENCOUNTER — Encounter (HOSPITAL_BASED_OUTPATIENT_CLINIC_OR_DEPARTMENT_OTHER): Admitting: Orthopaedic Surgery

## 2024-06-25 LAB — GENECONNECT MOLECULAR SCREEN: Genetic Analysis Overall Interpretation: NEGATIVE

## 2024-07-09 NOTE — Progress Notes (Signed)
 This encounter was created in error - please disregard.

## 2024-07-30 NOTE — Progress Notes (Signed)
 Remote ICD Transmission

## 2024-08-07 ENCOUNTER — Ambulatory Visit: Payer: Medicare HMO | Admitting: Family Medicine

## 2024-08-07 ENCOUNTER — Other Ambulatory Visit (HOSPITAL_COMMUNITY): Payer: Self-pay

## 2024-08-07 ENCOUNTER — Encounter: Payer: Self-pay | Admitting: Family Medicine

## 2024-08-07 VITALS — BP 130/75 | HR 81 | Temp 97.2°F | Ht 67.5 in | Wt 228.2 lb

## 2024-08-07 DIAGNOSIS — R29898 Other symptoms and signs involving the musculoskeletal system: Secondary | ICD-10-CM

## 2024-08-07 DIAGNOSIS — E7800 Pure hypercholesterolemia, unspecified: Secondary | ICD-10-CM

## 2024-08-07 DIAGNOSIS — I5042 Chronic combined systolic (congestive) and diastolic (congestive) heart failure: Secondary | ICD-10-CM | POA: Diagnosis not present

## 2024-08-07 DIAGNOSIS — Z0001 Encounter for general adult medical examination with abnormal findings: Secondary | ICD-10-CM

## 2024-08-07 DIAGNOSIS — D649 Anemia, unspecified: Secondary | ICD-10-CM

## 2024-08-07 DIAGNOSIS — Z0000 Encounter for general adult medical examination without abnormal findings: Secondary | ICD-10-CM

## 2024-08-07 DIAGNOSIS — E890 Postprocedural hypothyroidism: Secondary | ICD-10-CM

## 2024-08-07 DIAGNOSIS — M8589 Other specified disorders of bone density and structure, multiple sites: Secondary | ICD-10-CM | POA: Diagnosis not present

## 2024-08-07 DIAGNOSIS — R7303 Prediabetes: Secondary | ICD-10-CM | POA: Diagnosis not present

## 2024-08-07 DIAGNOSIS — Z853 Personal history of malignant neoplasm of breast: Secondary | ICD-10-CM

## 2024-08-07 DIAGNOSIS — F5101 Primary insomnia: Secondary | ICD-10-CM | POA: Diagnosis not present

## 2024-08-07 DIAGNOSIS — I480 Paroxysmal atrial fibrillation: Secondary | ICD-10-CM | POA: Diagnosis not present

## 2024-08-07 LAB — BAYER DCA HB A1C WAIVED: HB A1C (BAYER DCA - WAIVED): 5.5 % (ref 4.8–5.6)

## 2024-08-07 MED ORDER — LEVOTHYROXINE SODIUM 75 MCG PO TABS: 75.0000 ug | ORAL_TABLET | Freq: Every day | ORAL | 4 refills | Status: AC

## 2024-08-07 MED ORDER — BELSOMRA 5 MG PO TABS: 5.0000 mg | ORAL_TABLET | Freq: Every evening | ORAL | 0 refills | Status: AC | PRN

## 2024-08-07 NOTE — Patient Instructions (Signed)
 Insomnia Insomnia is a sleep disorder that makes it difficult to fall asleep or stay asleep. Insomnia can cause fatigue, low energy, difficulty concentrating, mood swings, and poor performance at work or school. There are three different ways to classify insomnia: Difficulty falling asleep. Difficulty staying asleep. Waking up too early in the morning. Any type of insomnia can be long-term (chronic) or short-term (acute). Both are common. Short-term insomnia usually lasts for 3 months or less. Chronic insomnia occurs at least three times a week for longer than 3 months. What are the causes? Insomnia may be caused by another condition, situation, or substance, such as: Having certain mental health conditions, such as anxiety and depression. Using caffeine, alcohol , tobacco, or drugs. Having gastrointestinal conditions, such as gastroesophageal reflux disease (GERD). Having certain medical conditions. These include: Asthma. Alzheimer's disease. Stroke. Chronic pain. An overactive thyroid  gland (hyperthyroidism). Other sleep disorders, such as restless legs syndrome and sleep apnea. Menopause. Sometimes, the cause of insomnia may not be known. What increases the risk? Risk factors for insomnia include: Gender. Females are affected more often than males. Age. Insomnia is more common as people get older. Stress and certain medical and mental health conditions. Lack of exercise. Having an irregular work schedule. This may include working night shifts and traveling between different time zones. What are the signs or symptoms? If you have insomnia, the main symptom is having trouble falling asleep or having trouble staying asleep. This may lead to other symptoms, such as: Feeling tired or having low energy. Feeling nervous about going to sleep. Not feeling rested in the morning. Having trouble concentrating. Feeling irritable, anxious, or depressed. How is this diagnosed? This condition  may be diagnosed based on: Your symptoms and medical history. Your health care provider may ask about: Your sleep habits. Any medical conditions you have. Your mental health. A physical exam. How is this treated? Treatment for insomnia depends on the cause. Treatment may focus on treating an underlying condition that is causing the insomnia. Treatment may also include: Medicines to help you sleep. Counseling or therapy. Lifestyle adjustments to help you sleep better. Follow these instructions at home: Eating and drinking  Limit or avoid alcohol , caffeinated beverages, and products that contain nicotine and tobacco, especially close to bedtime. These can disrupt your sleep. Do not eat a large meal or eat spicy foods right before bedtime. This can lead to digestive discomfort that can make it hard for you to sleep. Sleep habits  Keep a sleep diary to help you and your health care provider figure out what could be causing your insomnia. Write down: When you sleep. When you wake up during the night. How well you sleep and how rested you feel the next day. Any side effects of medicines you are taking. What you eat and drink. Make your bedroom a dark, comfortable place where it is easy to fall asleep. Put up shades or blackout curtains to block light from outside. Use a white noise machine to block noise. Keep the temperature cool. Limit screen use before bedtime. This includes: Not watching TV. Not using your smartphone, tablet, or computer. Stick to a routine that includes going to bed and waking up at the same times every day and night. This can help you fall asleep faster. Consider making a quiet activity, such as reading, part of your nighttime routine. Try to avoid taking naps during the day so that you sleep better at night. Get out of bed if you are still awake after  15 minutes of trying to sleep. Keep the lights down, but try reading or doing a quiet activity. When you feel  sleepy, go back to bed. General instructions Take over-the-counter and prescription medicines only as told by your health care provider. Exercise regularly as told by your health care provider. However, avoid exercising in the hours right before bedtime. Use relaxation techniques to manage stress. Ask your health care provider to suggest some techniques that may work well for you. These may include: Breathing exercises. Routines to release muscle tension. Visualizing peaceful scenes. Make sure that you drive carefully. Do not drive if you feel very sleepy. Keep all follow-up visits. This is important. Contact a health care provider if: You are tired throughout the day. You have trouble in your daily routine due to sleepiness. You continue to have sleep problems, or your sleep problems get worse. Get help right away if: You have thoughts about hurting yourself or someone else. Get help right away if you feel like you may hurt yourself or others, or have thoughts about taking your own life. Go to your nearest emergency room or: Call 911. Call the National Suicide Prevention Lifeline at 2232757840 or 988. This is open 24 hours a day. Text the Crisis Text Line at 657-529-4371. Summary Insomnia is a sleep disorder that makes it difficult to fall asleep or stay asleep. Insomnia can be long-term (chronic) or short-term (acute). Treatment for insomnia depends on the cause. Treatment may focus on treating an underlying condition that is causing the insomnia. Keep a sleep diary to help you and your health care provider figure out what could be causing your insomnia. This information is not intended to replace advice given to you by your health care provider. Make sure you discuss any questions you have with your health care provider. Document Revised: 09/20/2021 Document Reviewed: 09/20/2021 Elsevier Patient Education  2024 [employer].

## 2024-08-07 NOTE — Progress Notes (Signed)
 Jonelle EBANY BOWERMASTER is a 63 y.o. female presents to office today for annual physical exam examination.    Weakness in her lower extremities She reports that she has been having about a 3-week history of difficulty with mobility specifically getting in and out of the car and/or standing up from a seated position.  She does not have any difficulty when she is actually standing and/or walking around.  Denies any sensory changes or overt buckling of the lower extremities or sensation of weakness in the lower extremities when she is able to get into a standing position.  She denies any preceding illness or injury causing the symptoms.  She has been evaluated for her back before with [facility] and was told that she does have a slipped disc in the low back- she is not quite sure when that last imaging was done.  Insomnia Reports difficulty both falling asleep and staying asleep.  She is been utilizing some type of natural spray that contains melatonin but that really has not helped.  She does not report excessive consumption of caffeine.  She has not tried anything over-the-counter because she is cautious about what she takes due to the cardiac disease she has.  She has utilized Ambien in the past but it caused her to hallucinate and become overly sedated.  She does continue to use tramadol  intermittently for chronic pain  Substance use: none Health Maintenance Due  Topic Date Due   DEXA SCAN  Never done   Medicare Annual Wellness (AWV)  Never done    Immunization History  Administered Date(s) Administered   Moderna Sars-Covid-2 Vaccination 01/02/2020, 02/04/2020, 10/19/2020   Tdap 04/05/2013, 06/21/2023   Past Medical History:  Diagnosis Date   A-fib (HCC)    Acute deep vein thrombosis (DVT) of right popliteal vein (HCC) 04/13/2022   Acute pulmonary embolism (HCC) 04/12/2022   AICD (automatic cardioverter/defibrillator) present 2009   [employer]- replaced 2016   Arthritis     Bradycardia    Breast cancer (HCC) 02/24/2016   s/p lumpectomy and radiation but refused chemotherapy   Chronic systolic CHF (congestive heart failure) (HCC)    Cough due to angiotensin-converting enzyme inhibitor    Deep venous thrombosis of lower extremity (HCC) 05/24/2023   Dizziness    Dizziness with standing after squatting, March, 2012   DVT (deep venous thrombosis) (HCC)    Dyslipidemia    LDL elevation,Patient does not want statin   Family history of breast cancer    GERD (gastroesophageal reflux disease)    H/O shortness of breath    CPX 10/18:  No evidence of cardiopulmonary limitation. Suspect exercise intolerance related to weight and deconditioning.    Hypothyroidism    ICD (implantable cardiac defibrillator) battery depletion    Dr. Fernande, June, 2009(artifactual atrial tachycardia response events addressed by reprogramming in parentheses   Leg swelling    right leg   Mitral regurgitation    Nonischemic cardiomyopathy (HCC)    Etiology unknown, diagnosed 2008   Paroxysmal atrial fibrillation (HCC) 06/20/2022   PE (pulmonary thromboembolism) (HCC)    Personal history of radiation therapy 2017   Pneumonia due to COVID-19 virus 10/2019   Pulmonary embolism (HCC) 04/21/2020   TIA (transient ischemic attack)    No CT or MRI abnormality, aspirin  therapy   TIA (transient ischemic attack) 2007   Social History   Socioeconomic History   Marital status: Widowed    Spouse name: Not on file   Number of children: Not  on file   Years of education: Not on file   Highest education level: Some college, no degree  Occupational History   Occupation: Widow  Tobacco Use   Smoking status: Never   Smokeless tobacco: Never  Vaping Use   Vaping status: Never Used  Substance and Sexual Activity   Alcohol use: No   Drug use: No   Sexual activity: Not Currently    Birth control/protection: Surgical    Comment: tubal  Other Topics Concern   Not on file  Social History Narrative    Not on file   Social Drivers of Health   Financial Resource Strain: High Risk (08/04/2024)   Overall Financial Resource Strain (CARDIA)    Difficulty of Paying Living Expenses: Hard  Food Insecurity: Food Insecurity Present (08/04/2024)   Hunger Vital Sign    Worried About Running Out of Food in the Last Year: Often true    Ran Out of Food in the Last Year: Sometimes true  Transportation Needs: No Transportation Needs (08/04/2024)   PRAPARE - Administrator, Civil Service (Medical): No    Lack of Transportation (Non-Medical): No  Physical Activity: Insufficiently Active (08/04/2024)   Exercise Vital Sign    Days of Exercise per Week: 2 days    Minutes of Exercise per Session: 20 min  Stress: No Stress Concern Present (08/04/2024)   [employer] of Occupational Health - Occupational Stress Questionnaire    Feeling of Stress: Only a little  Social Connections: Moderately Integrated (08/04/2024)   Social Connection and Isolation Panel    Frequency of Communication with Friends and Family: More than three times a week    Frequency of Social Gatherings with Friends and Family: Three times a week    Attends Religious Services: More than 4 times per year    Active Member of Clubs or Organizations: Yes    Attends Banker Meetings: 1 to 4 times per year    Marital Status: Widowed  Intimate Partner Violence: Unknown (01/25/2022)   Received from [facility]   HITS    Physically Hurt: Not on file    Insult or Talk Down To: Not on file    Threaten Physical Harm: Not on file    Scream or Curse: Not on file   Past Surgical History:  Procedure Laterality Date   BREAST BIOPSY Left 02/24/2016   U/S Core   BREAST BIOPSY Left 02/24/2016   U/S Core   BREAST LUMPECTOMY Left 04/05/2016   invasive ductal    BREAST LUMPECTOMY WITH RADIOACTIVE SEED AND SENTINEL LYMPH NODE BIOPSY Left 04/05/2016   Procedure: BREAST LUMPECTOMY WITH RADIOACTIVE SEED AND SENTINEL LYMPH  NODE BIOPSY;  Surgeon: Alm Angle, MD;  Location: [facility] OR;  Service: General;  Laterality: Left;   CARDIAC CATHETERIZATION N/A 08/05/2016   Procedure: Left Heart Cath and Coronary Angiography;  Surgeon: Peter M Swaziland, MD;  Location: [facility] INVASIVE CV LAB;  Service: Cardiovascular;  Laterality: N/A;   CARDIAC DEFIBRILLATOR PLACEMENT  09   [employer] no remote   COLONOSCOPY     COLONOSCOPY N/A 01/19/2024   Procedure: COLONOSCOPY;  Surgeon: Cindie Carlin POUR, DO;  Location: [facility];  Service: Endoscopy;  Laterality: N/A;  11:00 am, asa 3/4   CYST EXCISION Left    back of leg-lt    ESOPHAGOGASTRODUODENOSCOPY N/A 01/19/2024   Procedure: EGD (ESOPHAGOGASTRODUODENOSCOPY);  Surgeon: Cindie Carlin POUR, DO;  Location: [facility];  Service: Endoscopy;  Laterality: N/A;   IMPLANTABLE  CARDIOVERTER DEFIBRILLATOR GENERATOR CHANGE N/A 12/29/2014   Procedure: IMPLANTABLE CARDIOVERTER DEFIBRILLATOR GENERATOR CHANGE;  Surgeon: Elspeth JAYSON Sage, MD;  Location: [facility] CATH LAB;  Service: Cardiovascular;  Laterality: N/A;   LATERAL EPICONDYLE RELEASE  10/11/2012   Procedure: TENNIS ELBOW RELEASE;  Surgeon: Toribio JULIANNA Chancy, MD;  Location: [facility];  Service: Orthopedics;  Laterality: Right;  RIGHT ELBOW: TENOTOMY ELBOW LATERAL EPICONDYLITIS TENNIS ELBOW: RADIAL TUNNEL RELEASE   POLYPECTOMY  01/19/2024   Procedure: POLYPECTOMY;  Surgeon: Cindie Carlin POUR, DO;  Location: [facility];  Service: Endoscopy;;   PORT-A-CATH REMOVAL  04/13/2016   Procedure: MINOR REMOVAL PORT-A-CATH;  Surgeon: Donnice Lima, MD;  Location:  SURGERY CENTER;  Service: General;;   PORTACATH PLACEMENT Right 04/05/2016   Procedure: INSERTION PORT-A-CATH WITH US ;  Surgeon: Alm Angle, MD;  Location: [facility] OR;  Service: General;  Laterality: Right;  right IJ   TOTAL THYROIDECTOMY  2001   TUBAL LIGATION  94   UPPER GI ENDOSCOPY     Family History  Problem Relation Age of Onset   Heart attack Mother    Cancer  Mother        breast   Breast cancer Mother 41   Hypertension Sister    Hypertension Brother    Cancer Paternal Grandmother        possible cancer, unknown type   Cancer Maternal Aunt        breast   Breast cancer Maternal Aunt    Stroke Neg Hx    Diabetes Neg Hx    Colon cancer Neg Hx     Current Outpatient Medications:    apixaban  (ELIQUIS ) 5 MG TABS tablet, Take 1 tablet by mouth twice daily, Disp: 180 tablet, Rfl: 1   carvedilol  (COREG ) 3.125 MG tablet, Take 1 tablet (3.125 mg total) by mouth 2 (two) times daily. NEEDS FOLLOW UP APPOINTMENT FOR ANYMORE REFILLS, Disp: 180 tablet, Rfl: 2   eplerenone  (INSPRA ) 25 MG tablet, TAKE 1 TABLET BY MOUTH ONCE DAILY., Disp: 90 tablet, Rfl: 2   fluticasone  (FLONASE ) 50 MCG/ACT nasal spray, Place 2 sprays into both nostrils daily., Disp: 16 g, Rfl: 6   linaclotide  (LINZESS ) 290 MCG CAPS capsule, Take 1 capsule (290 mcg total) by mouth daily before breakfast., Disp: 30 capsule, Rfl: 5   loratadine (CLARITIN) 10 MG tablet, Take 10 mg by mouth as needed., Disp: , Rfl:    rosuvastatin  (CRESTOR ) 10 MG tablet, Take 1 tablet (10 mg total) by mouth daily., Disp: 90 tablet, Rfl: 3   traMADol  (ULTRAM ) 50 MG tablet, Take 50 mg by mouth every 6 (six) hours as needed., Disp: , Rfl:    valsartan  (DIOVAN ) 80 MG tablet, Take 1 tablet (80 mg total) by mouth daily., Disp: , Rfl:    levothyroxine  (SYNTHROID ) 75 MCG tablet, Take 1 tablet (75 mcg total) by mouth daily before breakfast., Disp: 90 tablet, Rfl: 4  Allergies  Allergen Reactions   Avelox [Moxifloxacin Hcl In Nacl] Shortness Of Breath    Shortness of breath   Clindamycin/Lincomycin Shortness Of Breath and Diarrhea    Breathing/diarrhea     Kiwi Extract Anaphylaxis    Makes pt. Feel like her throat is closing    Bactrim [Sulfamethoxazole-Trimethoprim] Swelling   Levofloxacin Other (See Comments)    JOINT PAIN JOINT PAIN   Simvastatin Other (See Comments)    Joint pain   Spironolactone  Other  (See Comments)    burning   Moxifloxacin Hcl Other (See Comments)   Tramadol   Nausea Only     ROS: Review of Systems Pertinent items noted in HPI and remainder of comprehensive ROS otherwise negative.    Physical exam BP 130/75   Pulse 81   Temp (!) 97.2 F (36.2 C)   Ht 5' 7.5 (1.715 m)   Wt 228 lb 3.2 oz (103.5 kg)   SpO2 97%   BMI 35.21 kg/m  General appearance: alert, cooperative, appears stated age, no distress, and morbidly obese Head: Normocephalic, without obvious abnormality, atraumatic Eyes: negative findings: lids and lashes normal, conjunctivae and sclerae normal, corneas clear, and pupils equal, round, reactive to light and accomodation Ears: normal TM's and external ear canals both ears Nose: Nares normal. Septum midline. Mucosa normal. No drainage or sinus tenderness. Throat: lips, mucosa, and tongue normal; teeth and gums normal Neck: no adenopathy, no carotid bruit, supple, symmetrical, trachea midline, and thyroid  not enlarged, symmetric, no tenderness/mass/nodules Back: symmetric, no curvature. ROM normal. No CVA tenderness. Lungs: clear to auscultation bilaterally Heart: regular rate and rhythm, S1, S2 normal, no murmur, click, rub or gallop Abdomen: soft, non-tender; bowel sounds normal; no masses,  no organomegaly Extremities: extremities normal, atraumatic, no cyanosis or edema Pulses: 2+ and symmetric Skin: Hyperkeratotic lesion noted along dorsal aspect of the right hand consistent with actinic keratosis Lymph nodes: Cervical, supraclavicular, and axillary nodes normal. Neurologic: Alert and oriented X 3, normal strength and tone. Normal symmetric reflexes. Normal coordination and gait; she does have difficulty rising from a seated position     08/07/2024    7:56 AM 02/16/2024    9:28 AM 12/22/2023    8:26 AM  Depression screen PHQ 2/9  Decreased Interest 2 1 1   Down, Depressed, Hopeless 0 1 1  PHQ - 2 Score 2 2 2   Altered sleeping 3 1 1   Tired,  decreased energy 2 1 1   Change in appetite 1 1 0  Feeling bad or failure about yourself  0 0 0  Trouble concentrating 0 0 0  Moving slowly or fidgety/restless 0 0 0  Suicidal thoughts 0 0 0  PHQ-9 Score 8 5 4   Difficult doing work/chores Somewhat difficult Not difficult at all Not difficult at all      02/16/2024    9:28 AM 12/22/2023    8:26 AM 12/22/2023    8:20 AM 10/06/2023   11:28 AM  GAD 7 : Generalized Anxiety Score  Nervous, Anxious, on Edge 1 1 0 1  Control/stop worrying 0 1 0 0  Worry too much - different things 0 1 0 1  Trouble relaxing 1 1 0 1  Restless 0 0 0 0  Easily annoyed or irritable 1 0 0 0  Afraid - awful might happen 0 0 0 0  Total GAD 7 Score 3 4 0 3  Anxiety Difficulty Not difficult at all Not difficult at all Not difficult at all Not difficult at all     Assessment/ Plan: Madelin CHRISTELLA Cook here for annual physical exam.   Annual physical exam  Primary insomnia - Plan: Suvorexant (BELSOMRA) 5 MG TABS, ToxASSURE Select 13 (MW), Urine  Pelvic girdle weakness - Plan: CK, ANA w/Reflex if Positive, C-reactive protein, Sedimentation Rate, Ambulatory referral to Physical Therapy  Morbid obesity (HCC)  Osteopenia of multiple sites - Plan: DG [facility] DEXA, CMP14+EGFR  History of breast cancer - Plan: DG [facility] DEXA, CMP14+EGFR  Prediabetes - Plan: Bayer DCA Hb A1c Waived, CMP14+EGFR  Pure hypercholesterolemia - Plan: CMP14+EGFR, Lipid Panel  Paroxysmal atrial fibrillation (HCC) -  Plan: CMP14+EGFR  Chronic combined systolic and diastolic HF (heart failure) (HCC) - Plan: CMP14+EGFR  Anemia, unspecified type - Plan: CMP14+EGFR, Anemia Profile B  Post-surgical hypothyroidism - Plan: levothyroxine  (SYNTHROID ) 75 MCG tablet   Fasting labs collected today.  Will CC results to cardiologist.  I ordered her DEXA scan to follow-up on osteopenia.  She had vitamin D checked earlier this year  Regarding her insomnia, we discussed options including Sonata but given very  marked reaction to Ambien and a little nervous to use that.  I do not think trazodone or doxepin are appropriate given history of atrial fibrillation and the potential for QTc prolongation, especially in the setting of tramadol  use.  I fear that mirtazapine would cause weight gain and she is already struggling with morbid obesity.  We could consider a benzodiazepine like Restoril but again I be very hesitant if she is continuing to use tramadol  regularly.  Plus this of course poses a higher risk for adverse side effects and outcomes as she ages.  Will see if we can get Belsomra or Rozarem covered for her as a really no other agents that would be appropriate for her at this time  Counseled on healthy lifestyle choices, including diet (rich in fruits, vegetables and lean meats and low in salt and simple carbohydrates) and exercise (at least 30 minutes of moderate physical activity daily).  Patient to follow up 2 weeks for insomnia virtually.  Zyriah Mask M. Jolinda, DO

## 2024-08-08 ENCOUNTER — Ambulatory Visit (INDEPENDENT_AMBULATORY_CARE_PROVIDER_SITE_OTHER): Payer: 59

## 2024-08-08 DIAGNOSIS — I5020 Unspecified systolic (congestive) heart failure: Secondary | ICD-10-CM | POA: Diagnosis not present

## 2024-08-09 ENCOUNTER — Ambulatory Visit: Payer: Self-pay | Admitting: Internal Medicine

## 2024-08-09 ENCOUNTER — Encounter: Payer: Self-pay | Admitting: Family Medicine

## 2024-08-09 ENCOUNTER — Encounter: Payer: Self-pay | Admitting: Internal Medicine

## 2024-08-09 DIAGNOSIS — R700 Elevated erythrocyte sedimentation rate: Secondary | ICD-10-CM

## 2024-08-09 DIAGNOSIS — R29898 Other symptoms and signs involving the musculoskeletal system: Secondary | ICD-10-CM

## 2024-08-09 DIAGNOSIS — Z79899 Other long term (current) drug therapy: Secondary | ICD-10-CM

## 2024-08-09 DIAGNOSIS — E7849 Other hyperlipidemia: Secondary | ICD-10-CM

## 2024-08-09 DIAGNOSIS — M353 Polymyalgia rheumatica: Secondary | ICD-10-CM

## 2024-08-09 DIAGNOSIS — E782 Mixed hyperlipidemia: Secondary | ICD-10-CM

## 2024-08-09 LAB — CUP PACEART REMOTE DEVICE CHECK
Battery Remaining Longevity: 36 mo
Battery Remaining Percentage: 42 %
Brady Statistic RA Percent Paced: 0 %
Brady Statistic RV Percent Paced: 0 %
Date Time Interrogation Session: 20251016000200
HighPow Impedance: 52 Ohm
Implantable Lead Connection Status: 753985
Implantable Lead Connection Status: 753985
Implantable Lead Implant Date: 20090320
Implantable Lead Implant Date: 20090320
Implantable Lead Location: 753859
Implantable Lead Location: 753860
Implantable Lead Model: 158
Implantable Lead Model: 5076
Implantable Lead Serial Number: 182504
Implantable Pulse Generator Implant Date: 20160307
Lead Channel Impedance Value: 430 Ohm
Lead Channel Impedance Value: 505 Ohm
Lead Channel Pacing Threshold Amplitude: 0.4 V
Lead Channel Pacing Threshold Amplitude: 1.1 V
Lead Channel Pacing Threshold Pulse Width: 0.4 ms
Lead Channel Pacing Threshold Pulse Width: 0.4 ms
Lead Channel Setting Pacing Amplitude: 2 V
Lead Channel Setting Pacing Amplitude: 2.5 V
Lead Channel Setting Pacing Pulse Width: 0.4 ms
Lead Channel Setting Sensing Sensitivity: 0.6 mV
Pulse Gen Serial Number: 111826

## 2024-08-09 LAB — ANEMIA PROFILE B
Basophils Absolute: 0 x10E3/uL (ref 0.0–0.2)
Basos: 1 %
EOS (ABSOLUTE): 0.1 x10E3/uL (ref 0.0–0.4)
Eos: 3 %
Ferritin: 43 ng/mL (ref 15–150)
Folate: 7.5 ng/mL (ref >3.0)
Hematocrit: 34.6 % (ref 34.0–46.6)
Hemoglobin: 11.2 g/dL (ref 11.1–15.9)
Immature Grans (Abs): 0 x10E3/uL (ref 0.0–0.1)
Immature Granulocytes: 0 %
Iron Saturation: 19 % (ref 15–55)
Iron: 57 ug/dL (ref 27–139)
Lymphocytes Absolute: 1.5 x10E3/uL (ref 0.7–3.1)
Lymphs: 31 %
MCH: 30.9 pg (ref 26.6–33.0)
MCHC: 32.4 g/dL (ref 31.5–35.7)
MCV: 96 fL (ref 79–97)
Monocytes Absolute: 0.4 x10E3/uL (ref 0.1–0.9)
Monocytes: 8 %
Neutrophils Absolute: 2.9 x10E3/uL (ref 1.4–7.0)
Neutrophils: 57 %
Platelets: 196 x10E3/uL (ref 150–450)
RBC: 3.62 x10E6/uL — ABNORMAL LOW (ref 3.77–5.28)
RDW: 13.1 % (ref 11.7–15.4)
Retic Ct Pct: 1.6 % (ref 0.6–2.6)
Total Iron Binding Capacity: 296 ug/dL (ref 250–450)
UIBC: 239 ug/dL (ref 118–369)
Vitamin B-12: 410 pg/mL (ref 232–1245)
WBC: 4.9 x10E3/uL (ref 3.4–10.8)

## 2024-08-09 LAB — CMP14+EGFR
ALT: 21 IU/L (ref 0–32)
AST: 24 IU/L (ref 0–40)
Albumin: 4.3 g/dL (ref 3.9–4.9)
Alkaline Phosphatase: 60 IU/L (ref 49–135)
BUN/Creatinine Ratio: 11 — ABNORMAL LOW (ref 12–28)
BUN: 10 mg/dL (ref 8–27)
Bilirubin Total: 0.3 mg/dL (ref 0.0–1.2)
CO2: 23 mmol/L (ref 20–29)
Calcium: 9.6 mg/dL (ref 8.7–10.3)
Chloride: 107 mmol/L — ABNORMAL HIGH (ref 96–106)
Creatinine, Ser: 0.91 mg/dL (ref 0.57–1.00)
Globulin, Total: 1.9 g/dL (ref 1.5–4.5)
Glucose: 90 mg/dL (ref 70–99)
Potassium: 4.4 mmol/L (ref 3.5–5.2)
Sodium: 145 mmol/L — ABNORMAL HIGH (ref 134–144)
Total Protein: 6.2 g/dL (ref 6.0–8.5)
eGFR: 70 mL/min/1.73 (ref >59)

## 2024-08-09 LAB — LIPID PANEL
Chol/HDL Ratio: 2.8 ratio (ref 0.0–4.4)
Cholesterol, Total: 194 mg/dL (ref 100–199)
HDL: 70 mg/dL (ref >39)
LDL Chol Calc (NIH): 110 mg/dL — ABNORMAL HIGH (ref 0–99)
Triglycerides: 77 mg/dL (ref 0–149)
VLDL Cholesterol Cal: 14 mg/dL (ref 5–40)

## 2024-08-09 LAB — SEDIMENTATION RATE: Sed Rate: 49 mm/h — ABNORMAL HIGH (ref 0–40)

## 2024-08-09 LAB — C-REACTIVE PROTEIN: CRP: 5 mg/L (ref 0–10)

## 2024-08-09 LAB — CK: Total CK: 75 U/L (ref 32–182)

## 2024-08-09 LAB — ANA W/REFLEX IF POSITIVE: Anti Nuclear Antibody (ANA): NEGATIVE

## 2024-08-09 MED ORDER — ROSUVASTATIN CALCIUM 20 MG PO TABS: 20.0000 mg | ORAL_TABLET | Freq: Every day | ORAL | 3 refills | Status: AC

## 2024-08-09 MED ORDER — PREDNISONE 10 MG PO TABS: 15.0000 mg | ORAL_TABLET | Freq: Every day | ORAL | 0 refills | Status: DC

## 2024-08-10 LAB — TOXASSURE SELECT 13 (MW), URINE

## 2024-08-12 ENCOUNTER — Telehealth: Payer: Self-pay

## 2024-08-12 NOTE — Telephone Encounter (Signed)
*  [facility]  Pharmacy Patient Advocate Encounter   Received notification from Fax that prior authorization for Belsomra is required/requested.   Insurance verification completed.   The patient is insured through [facility].   Per test claim:  Temazepam 15mg , Temazepam 30mg , Zaleplon, Zolpidem Tartrate, Doxepin HCl, Dayvigo is preferred by the insurance.  If suggested medication is appropriate, Please send in a new RX and discontinue this one. If not, please advise as to why it's not appropriate so that we may request a Prior Authorization. Please note, some preferred medications may still require a PA.  If the suggested medications have not been trialed and there are no contraindications to their use, the PA will not be submitted, as it will not be approved.   PA form in [place]

## 2024-08-12 NOTE — Telephone Encounter (Signed)
 From my note: Regarding her insomnia, we discussed options including Sonata but given very marked reaction to Ambien and a little nervous to use that.  I do not think trazodone or doxepin are appropriate given history of atrial fibrillation and the potential for QTc prolongation, especially in the setting of tramadol  use.  I fear that mirtazapine would cause weight gain and she is already struggling with morbid obesity.  We could consider a benzodiazepine like Restoril but again I be very hesitant if she is continuing to use tramadol  regularly.  Plus this of course poses a higher risk for adverse side effects and outcomes as she ages.  Will see if we can get Belsomra or Rozarem covered for her as a really no other agents that would be appropriate for her at this time

## 2024-08-14 NOTE — Progress Notes (Signed)
 Remote ICD Transmission

## 2024-08-15 NOTE — Telephone Encounter (Signed)
 Additional information has been requested from the patient's insurance in order to proceed with the prior authorization request. Requested information has been sent, or form has been filled out and faxed back to [phone]

## 2024-08-18 ENCOUNTER — Encounter: Payer: Self-pay | Admitting: Family Medicine

## 2024-08-18 ENCOUNTER — Ambulatory Visit: Payer: Self-pay | Admitting: Student in an Organized Health Care Education/Training Program

## 2024-08-19 ENCOUNTER — Ambulatory Visit (INDEPENDENT_AMBULATORY_CARE_PROVIDER_SITE_OTHER)

## 2024-08-19 DIAGNOSIS — Z853 Personal history of malignant neoplasm of breast: Secondary | ICD-10-CM

## 2024-08-19 DIAGNOSIS — M8589 Other specified disorders of bone density and structure, multiple sites: Secondary | ICD-10-CM

## 2024-08-20 ENCOUNTER — Other Ambulatory Visit (HOSPITAL_COMMUNITY): Payer: Self-pay

## 2024-08-20 NOTE — Telephone Encounter (Signed)
 Pharmacy Patient Advocate Encounter  Received notification from [facility] that Prior Authorization for [facility] has been APPROVED from 10/25/23 to 10/23/25. Ran test claim, Copay is $0. This test claim was processed through [facility]- copay amounts may vary at other pharmacies due to pharmacy/plan contracts, or as the patient moves through the different stages of their insurance plan.

## 2024-08-20 NOTE — Telephone Encounter (Signed)
 Noted

## 2024-08-21 ENCOUNTER — Ambulatory Visit: Admitting: Family Medicine

## 2024-08-21 DIAGNOSIS — Z780 Asymptomatic menopausal state: Secondary | ICD-10-CM | POA: Diagnosis not present

## 2024-08-22 ENCOUNTER — Encounter: Payer: Self-pay | Admitting: Family Medicine

## 2024-08-22 ENCOUNTER — Other Ambulatory Visit: Payer: Self-pay | Admitting: Family Medicine

## 2024-08-22 ENCOUNTER — Ambulatory Visit (INDEPENDENT_AMBULATORY_CARE_PROVIDER_SITE_OTHER)

## 2024-08-22 VITALS — BP 130/75 | HR 81 | Ht 67.0 in | Wt 228.0 lb

## 2024-08-22 DIAGNOSIS — Z0000 Encounter for general adult medical examination without abnormal findings: Secondary | ICD-10-CM | POA: Diagnosis not present

## 2024-08-22 DIAGNOSIS — R0602 Shortness of breath: Secondary | ICD-10-CM

## 2024-08-22 DIAGNOSIS — R0609 Other forms of dyspnea: Secondary | ICD-10-CM

## 2024-08-22 DIAGNOSIS — R058 Other specified cough: Secondary | ICD-10-CM

## 2024-08-22 NOTE — Progress Notes (Signed)
 Subjective:   [name] [name] is a 63 y.o. who presents for a Medicare Wellness preventive visit.  As a reminder, Annual Wellness Visits don't include a physical exam, and some assessments may be limited, especially if this visit is performed virtually. We may recommend an in-person follow-up visit with your provider if needed.  Visit Complete: Virtual I connected with  [name] [name] on 08/22/24 by a audio enabled telemedicine application and verified that I am speaking with the correct person using two identifiers.  Patient Location: Home  Provider Location: Home Office  I discussed the limitations of evaluation and management by telemedicine. The patient expressed understanding and agreed to proceed.  Vital Signs: Because this visit was a virtual/telehealth visit, some criteria may be missing or patient reported. Any vitals not documented were not able to be obtained and vitals that have been documented are patient reported.  VideoDeclined- This patient declined Librarian, academic. Therefore the visit was completed with audio only.  Persons Participating in Visit: Patient.  AWV Questionnaire: Yes: Patient Medicare AWV questionnaire was completed by the patient on 08/19/24; I have confirmed that all information answered by patient is correct and no changes since this date.        Objective:    Today's Vitals   08/22/24 0913  BP: 130/75  Pulse: 81  Weight: 228 lb (103.4 kg)  Height: 5' 7 (1.702 m)   Body mass index is 35.71 kg/m.     08/22/2024    9:25 AM 06/26/2023   10:49 PM 03/12/2023    4:50 PM 03/08/2023   12:37 PM 11/16/2022    2:24 PM 10/01/2022    9:20 AM 07/19/2022    9:05 AM  Advanced Directives  Does Patient Have a Medical Advance Directive? No No No No No No No  Would patient like information on creating a medical advance directive?  No - Patient declined No - Patient declined    No - Patient declined    Current Medications  (verified) Outpatient Encounter Medications as of 08/22/2024  Medication Sig   apixaban  (ELIQUIS ) 5 MG TABS tablet Take 1 tablet by mouth twice daily   carvedilol  (COREG ) 3.125 MG tablet Take 1 tablet (3.125 mg total) by mouth 2 (two) times daily. NEEDS FOLLOW UP APPOINTMENT FOR ANYMORE REFILLS   eplerenone  (INSPRA ) 25 MG tablet TAKE 1 TABLET BY MOUTH ONCE DAILY.   fluticasone  (FLONASE ) 50 MCG/ACT nasal spray Place 2 sprays into both nostrils daily.   levothyroxine  (SYNTHROID ) 75 MCG tablet Take 1 tablet (75 mcg total) by mouth daily before breakfast.   linaclotide  (LINZESS ) 290 MCG CAPS capsule Take 1 capsule (290 mcg total) by mouth daily before breakfast.   loratadine (CLARITIN) 10 MG tablet Take 10 mg by mouth as needed.   predniSONE  (DELTASONE ) 10 MG tablet Take 1.5 tablets (15 mg total) by mouth daily with breakfast.   rosuvastatin  (CRESTOR ) 20 MG tablet Take 1 tablet (20 mg total) by mouth daily.   Suvorexant (BELSOMRA) 5 MG TABS Take 1 tablet (5 mg total) by mouth at bedtime as needed (sleep).   traMADol  (ULTRAM ) 50 MG tablet Take 50 mg by mouth every 6 (six) hours as needed.   valsartan  (DIOVAN ) 80 MG tablet Take 1 tablet (80 mg total) by mouth daily.   No facility-administered encounter medications on file as of 08/22/2024.    Allergies (verified) Avelox [moxifloxacin hcl in nacl], Clindamycin/lincomycin, Kiwi extract, Bactrim [sulfamethoxazole-trimethoprim], Levofloxacin, Simvastatin, Spironolactone , Moxifloxacin hcl, and Tramadol   History: Past Medical History:  Diagnosis Date   A-fib (HCC)    Acute deep vein thrombosis (DVT) of right popliteal vein (HCC) 04/13/2022   Acute pulmonary embolism (HCC) 04/12/2022   AICD (automatic cardioverter/defibrillator) present 2009   [employer]- replaced 2016   Allergy    Just seasonal   Anxiety 08/24   Just noticed when i tried to sing at church ladt year. Became really nervous   Arthritis    Bradycardia    Breast cancer  (HCC) 02/24/2016   s/p lumpectomy and radiation but refused chemotherapy   Chronic systolic CHF (congestive heart failure) (HCC)    Cough due to angiotensin-converting enzyme inhibitor    Deep venous thrombosis of lower extremity (HCC) 05/24/2023   Dizziness    Dizziness with standing after squatting, March, 2012   DVT (deep venous thrombosis) (HCC)    Dyslipidemia    LDL elevation,Patient does not want statin   Family history of breast cancer    GERD (gastroesophageal reflux disease)    H/O shortness of breath    CPX 10/18:  No evidence of cardiopulmonary limitation. Suspect exercise intolerance related to weight and deconditioning.    Hyperlipidemia 2007   Hypothyroidism    ICD (implantable cardiac defibrillator) battery depletion    Dr. Fernande, June, 2009(artifactual atrial tachycardia response events addressed by reprogramming in parentheses   Leg swelling    right leg   Mitral regurgitation    Myocardial infarction [facility]) 2007   Nonischemic cardiomyopathy (HCC)    Etiology unknown, diagnosed 2008   Paroxysmal atrial fibrillation (HCC) 06/20/2022   PE (pulmonary thromboembolism) (HCC)    Personal history of radiation therapy 2017   Pneumonia due to COVID-19 virus 10/2019   Pulmonary embolism (HCC) 04/21/2020   Stroke (HCC) 2007   TIA (transient ischemic attack)    No CT or MRI abnormality, aspirin  therapy   TIA (transient ischemic attack) 2007   Past Surgical History:  Procedure Laterality Date   BREAST BIOPSY Left 02/24/2016   U/S Core   BREAST BIOPSY Left 02/24/2016   U/S Core   BREAST LUMPECTOMY Left 04/05/2016   invasive ductal    BREAST LUMPECTOMY WITH RADIOACTIVE SEED AND SENTINEL LYMPH NODE BIOPSY Left 04/05/2016   Procedure: BREAST LUMPECTOMY WITH RADIOACTIVE SEED AND SENTINEL LYMPH NODE BIOPSY;  Surgeon: Alm Angle, MD;  Location: [facility] OR;  Service: General;  Laterality: Left;   CARDIAC CATHETERIZATION N/A 08/05/2016   Procedure: Left Heart Cath and Coronary  Angiography;  Surgeon: Peter M Jordan, MD;  Location: [facility] INVASIVE CV LAB;  Service: Cardiovascular;  Laterality: N/A;   CARDIAC DEFIBRILLATOR PLACEMENT  09   [employer] no remote   COLONOSCOPY     COLONOSCOPY N/A 01/19/2024   Procedure: COLONOSCOPY;  Surgeon: Cindie Carlin POUR, DO;  Location: [facility] ENDO SUITE;  Service: Endoscopy;  Laterality: N/A;  11:00 am, asa 3/4   CYST EXCISION Left    back of leg-lt    ESOPHAGOGASTRODUODENOSCOPY N/A 01/19/2024   Procedure: EGD (ESOPHAGOGASTRODUODENOSCOPY);  Surgeon: Cindie Carlin POUR, DO;  Location: [facility];  Service: Endoscopy;  Laterality: N/A;   IMPLANTABLE CARDIOVERTER DEFIBRILLATOR GENERATOR CHANGE N/A 12/29/2014   Procedure: IMPLANTABLE CARDIOVERTER DEFIBRILLATOR GENERATOR CHANGE;  Surgeon: Elspeth JAYSON Fernande, MD;  Location: [facility] CATH LAB;  Service: Cardiovascular;  Laterality: N/A;   LATERAL EPICONDYLE RELEASE  10/11/2012   Procedure: TENNIS ELBOW RELEASE;  Surgeon: Toribio JULIANNA Chancy, MD;  Location: [facility];  Service: Orthopedics;  Laterality: Right;  RIGHT ELBOW:  TENOTOMY ELBOW LATERAL EPICONDYLITIS TENNIS ELBOW: RADIAL TUNNEL RELEASE   POLYPECTOMY  01/19/2024   Procedure: POLYPECTOMY;  Surgeon: Cindie Carlin POUR, DO;  Location: [facility];  Service: Endoscopy;;   PORT-A-CATH REMOVAL  04/13/2016   Procedure: MINOR REMOVAL PORT-A-CATH;  Surgeon: Donnice Lima, MD;  Location: [facility];  Service: General;;   PORTACATH PLACEMENT Right 04/05/2016   Procedure: INSERTION PORT-A-CATH WITH US ;  Surgeon: Alm Angle, MD;  Location: [facility] OR;  Service: General;  Laterality: Right;  right IJ   TOTAL THYROIDECTOMY  2001   TUBAL LIGATION  89   UPPER GI ENDOSCOPY     Family History  Problem Relation Age of Onset   Heart attack Mother    Cancer Mother        breast   Breast cancer Mother 72   Arthritis Mother    Heart disease Mother    Hypertension Sister    Arthritis Sister    Hypertension Brother    Arthritis Brother     Cancer Paternal Grandmother        possible cancer, unknown type   Cancer Maternal Aunt        breast   Breast cancer Maternal Aunt    Diabetes Brother    Miscarriages / Stillbirths Daughter    Obesity Daughter    Obesity Son    Stroke Neg Hx    Colon cancer Neg Hx    Social History   Socioeconomic History   Marital status: Widowed    Spouse name: Not on file   Number of children: Not on file   Years of education: Not on file   Highest education level: Some college, no degree  Occupational History   Occupation: Widow  Tobacco Use   Smoking status: Never   Smokeless tobacco: Never  Vaping Use   Vaping status: Never Used  Substance and Sexual Activity   Alcohol use: No   Drug use: No   Sexual activity: Not Currently    Birth control/protection: Surgical    Comment: tubal  Other Topics Concern   Not on file  Social History Narrative   Not on file   Social Drivers of Health   Financial Resource Strain: High Risk (08/22/2024)   Overall Financial Resource Strain (CARDIA)    Difficulty of Paying Living Expenses: Hard  Food Insecurity: Food Insecurity Present (08/22/2024)   Hunger Vital Sign    Worried About Running Out of Food in the Last Year: Sometimes true    Ran Out of Food in the Last Year: Sometimes true  Transportation Needs: No Transportation Needs (08/22/2024)   PRAPARE - Administrator, Civil Service (Medical): No    Lack of Transportation (Non-Medical): No  Physical Activity: Insufficiently Active (08/22/2024)   Exercise Vital Sign    Days of Exercise per Week: 2 days    Minutes of Exercise per Session: 20 min  Stress: No Stress Concern Present (08/22/2024)   [employer] of Occupational Health - Occupational Stress Questionnaire    Feeling of Stress: Not at all  Social Connections: Moderately Integrated (08/22/2024)   Social Connection and Isolation Panel    Frequency of Communication with Friends and Family: More than three times a  week    Frequency of Social Gatherings with Friends and Family: Three times a week    Attends Religious Services: More than 4 times per year    Active Member of Clubs or Organizations: Yes    Attends Banker  Meetings: 1 to 4 times per year    Marital Status: Widowed    Tobacco Counseling Counseling given: Yes    Clinical Intake:  Pre-visit preparation completed: Yes  Pain : No/denies pain     BMI - recorded: 35.71 Nutritional Status: BMI > 30  Obese Nutritional Risks: None Diabetes: No  Lab Results  Component Value Date   HGBA1C 5.5 08/07/2024   HGBA1C 6.0 (H) 03/04/2024   HGBA1C 5.7 (H) 12/22/2023     How often do you need to have someone help you when you read instructions, pamphlets, or other written materials from your doctor or pharmacy?: 1 - Never  Interpreter Needed?: No  Information entered by :: alia t/cma   Activities of Daily Living     08/19/2024    8:22 AM 07/05/2024    8:28 AM  In your present state of health, do you have any difficulty performing the following activities:  Hearing? 0 0  Vision? 0 0  Difficulty concentrating or making decisions? 1 1  Walking or climbing stairs? 1 1  Dressing or bathing? 0 0  Doing errands, shopping? 0 0  Preparing Food and eating ? N N  Using the Toilet? N N  In the past six months, have you accidently leaked urine? Y Y  Do you have problems with loss of bowel control? N N  Managing your Medications? N N  Managing your Finances? N N  Housekeeping or managing your Housekeeping? Y N    Patient Care Team: Jolinda Norene HERO, DO as PCP - General (Family Medicine) Okey Vina GAILS, MD as PCP - Cardiology (Cardiology) Odean Potts, MD as Consulting Physician (Hematology and Oncology) Dewey Rush, MD as Consulting Physician (Radiation Oncology) Ethyl Lenis, MD as Consulting Physician (General Surgery) Okey Vina GAILS, MD as Consulting Physician (Cardiology) Fernande Elspeth BROCKS, MD (Inactive) as  Consulting Physician (Cardiology)  I have updated your Care Teams any recent Medical Services you may have received from other providers in the past year.     Assessment:   This is a routine wellness examination for [name] [name].  Hearing/Vision screen Hearing Screening - Comments:: Pt denies hearing dif Vision Screening - Comments:: Pt denies vision dif/pt goes to [facility] in [place]/last 2023   Goals Addressed             This Visit's Progress    DIET - EAT MORE FRUITS AND VEGETABLES   On track    Patient Stated       Pt don't have any goals        Depression Screen     08/22/2024    9:25 AM 08/07/2024    7:56 AM 02/16/2024    9:28 AM 12/22/2023    8:26 AM 12/22/2023    8:20 AM 10/06/2023   11:28 AM 09/01/2023    3:04 PM  PHQ 2/9 Scores  PHQ - 2 Score 1 2 2 2  0 1 1  PHQ- 9 Score 6 8 5 4  0 7 8    Fall Risk     08/22/2024    9:23 AM 08/19/2024    8:22 AM 08/07/2024    7:52 AM 07/05/2024    8:28 AM 02/16/2024    9:28 AM  Fall Risk   Falls in the past year? 0 0 0 0 0  Number falls in past yr: 0 0  0 0  Injury with Fall? 0 0  0 0  Risk for fall due to : No Fall Risks  No Fall Risks  Follow up Falls evaluation completed    Falls evaluation completed    MEDICARE RISK AT HOME:  Medicare Risk at Home Any stairs in or around the home?: (Patient-Rptd) Yes If so, are there any without handrails?: (Patient-Rptd) No Home free of loose throw rugs in walkways, pet beds, electrical cords, etc?: (Patient-Rptd) Yes Adequate lighting in your home to reduce risk of falls?: (Patient-Rptd) Yes Life alert?: (Patient-Rptd) No Use of a cane, walker or w/c?: (Patient-Rptd) No Grab bars in the bathroom?: (Patient-Rptd) No Shower chair or bench in shower?: (Patient-Rptd) No Elevated toilet seat or a handicapped toilet?: (Patient-Rptd) Yes  TIMED UP AND GO:  Was the test performed?  no  Cognitive Function: 6CIT completed        08/22/2024    9:31 AM  6CIT Screen   What Year? 0 points  What month? 0 points  What time? 0 points  Count back from 20 0 points  Months in reverse 0 points  Repeat phrase 0 points  Total Score 0 points    Immunizations Immunization History  Administered Date(s) Administered   Moderna Sars-Covid-2 Vaccination 01/02/2020, 02/04/2020, 10/19/2020   Tdap 04/05/2013, 06/21/2023    Screening Tests Health Maintenance  Topic Date Due   COVID-19 Vaccine (4 - 2025-26 season) 08/23/2024 (Originally 06/24/2024)   Zoster Vaccines- Shingrix (1 of 2) 11/07/2024 (Originally 04/28/1980)   Influenza Vaccine  01/21/2025 (Originally 05/24/2024)   Cervical Cancer Screening (HPV/Pap Cotest)  02/15/2025 (Originally 03/25/2023)   Pneumococcal Vaccine: 50+ Years (1 of 2 - PCV) 08/07/2025 (Originally 04/28/1980)   Mammogram  05/20/2025   Medicare Annual Wellness (AWV)  08/22/2025   DEXA SCAN  08/21/2026   DTaP/Tdap/Td (3 - Td or Tdap) 06/20/2033   Colonoscopy  01/18/2034   Hepatitis C Screening  Completed   HIV Screening  Completed   Hepatitis B Vaccines 19-59 Average Risk  Aged Out   HPV VACCINES  Aged Out   Meningococcal B Vaccine  Aged Out    Health Maintenance Items Addressed: See Nurse Notes at the end of this note  Additional Screening:  Vision Screening: Recommended annual ophthalmology exams for early detection of glaucoma and other disorders of the eye. Is the patient up to date with their annual eye exam?  Yes  Who is the provider or what is the name of the office in which the patient attends annual eye exams? [facility] in [place]  Dental Screening: Recommended annual dental exams for proper oral hygiene  Community Resource Referral / Chronic Care Management: CRR required this visit?  No   CCM required this visit?  No   Plan:    I have personally reviewed and noted the following in the patient's chart:   Medical and social history Use of alcohol, tobacco or illicit drugs  Current medications and supplements  including opioid prescriptions. Patient is not currently taking opioid prescriptions. Functional ability and status Nutritional status Physical activity Advanced directives List of other physicians Hospitalizations, surgeries, and ER visits in previous 12 months Vitals Screenings to include cognitive, depression, and falls Referrals and appointments  In addition, I have reviewed and discussed with patient certain preventive protocols, quality metrics, and best practice recommendations. A written personalized care plan for preventive services as well as general preventive health recommendations were provided to patient.   Ozie Ned, CMA   08/22/2024   After Visit Summary: (MyChart) Due to this being a telephonic visit, the after visit summary with patients personalized plan was  offered to patient via MyChart   Notes: Nothing significant to report at this time.

## 2024-08-22 NOTE — Patient Instructions (Signed)
 [name],  Thank you for taking the time for your Medicare Wellness Visit. I appreciate your continued commitment to your health goals. Please review the care plan we discussed, and feel free to reach out if I can assist you further.  Medicare recommends these wellness visits once per year to help you and your care team stay ahead of potential health issues. These visits are designed to focus on prevention, allowing your provider to concentrate on managing your acute and chronic conditions during your regular appointments.  Please note that Annual Wellness Visits do not include a physical exam. Some assessments may be limited, especially if the visit was conducted virtually. If needed, we may recommend a separate in-person follow-up with your provider.  Ongoing Care Seeing your primary care provider every 3 to 6 months helps us  monitor your health and provide consistent, personalized care.   Referrals If a referral was made during today's visit and you haven't received any updates within two weeks, please contact the referred provider directly to check on the status.  Recommended Screenings:  Health Maintenance  Topic Date Due   Medicare Annual Wellness Visit  Never done   COVID-19 Vaccine (4 - 2025-26 season) 08/23/2024*   Zoster (Shingles) Vaccine (1 of 2) 11/07/2024*   Flu Shot  01/21/2025*   Pap with HPV screening  02/15/2025*   Pneumococcal Vaccine for age over 60 (1 of 2 - PCV) 08/07/2025*   Breast Cancer Screening  05/20/2025   DEXA scan (bone density measurement)  08/21/2026   DTaP/Tdap/Td vaccine (3 - Td or Tdap) 06/20/2033   Colon Cancer Screening  01/18/2034   Hepatitis C Screening  Completed   HIV Screening  Completed   Hepatitis B Vaccine  Aged Out   HPV Vaccine  Aged Out   Meningitis B Vaccine  Aged Out  *Topic was postponed. The date shown is not the original due date.       08/22/2024    9:25 AM  Advanced Directives  Does Patient Have a Medical Advance  Directive? No   Advance Care Planning is important because it: Ensures you receive medical care that aligns with your values, goals, and preferences. Provides guidance to your family and loved ones, reducing the emotional burden of decision-making during critical moments.  Vision: Annual vision screenings are recommended for early detection of glaucoma, cataracts, and diabetic retinopathy. These exams can also reveal signs of chronic conditions such as diabetes and high blood pressure.  Dental: Annual dental screenings help detect early signs of oral cancer, gum disease, and other conditions linked to overall health, including heart disease and diabetes.  Please see the attached documents for additional preventive care recommendations.

## 2024-08-23 MED ORDER — ALBUTEROL SULFATE HFA 108 (90 BASE) MCG/ACT IN AERS: 2.0000 | INHALATION_SPRAY | Freq: Four times a day (QID) | RESPIRATORY_TRACT | 0 refills | Status: DC | PRN

## 2024-08-26 ENCOUNTER — Encounter: Payer: Self-pay | Admitting: Radiology

## 2024-08-27 ENCOUNTER — Ambulatory Visit (HOSPITAL_COMMUNITY): Attending: Family Medicine | Admitting: Physical Therapy

## 2024-08-27 ENCOUNTER — Other Ambulatory Visit: Payer: Self-pay

## 2024-08-27 DIAGNOSIS — M5459 Other low back pain: Secondary | ICD-10-CM | POA: Diagnosis not present

## 2024-08-27 DIAGNOSIS — M6281 Muscle weakness (generalized): Secondary | ICD-10-CM | POA: Insufficient documentation

## 2024-08-27 DIAGNOSIS — R29898 Other symptoms and signs involving the musculoskeletal system: Secondary | ICD-10-CM | POA: Insufficient documentation

## 2024-08-27 NOTE — Therapy (Signed)
 OUTPATIENT PHYSICAL THERAPY LOWER EXTREMITY EVALUATION   Patient Name: [name] [name] MRN: [id number] DOB:08/09/1961, 63 y.o., female Today's Date: 08/27/2024  END OF SESSION:  PT End of Session - 08/27/24 1545     Visit Number 1    Number of Visits 12    Date for Recertification  10/08/24    Authorization Type [employer] medicare/medicaid    PT Start Time 1400    PT Stop Time 1500    PT Time Calculation (min) 60 min    Activity Tolerance Patient tolerated treatment well    Behavior During Therapy [facility] for tasks assessed/performed          Past Medical History:  Diagnosis Date   A-fib (HCC)    Acute deep vein thrombosis (DVT) of right popliteal vein (HCC) 04/13/2022   Acute pulmonary embolism (HCC) 04/12/2022   AICD (automatic cardioverter/defibrillator) present 2009   [employer]- replaced 2016   Allergy    Just seasonal   Anxiety 08/24   Just noticed when i tried to sing at church ladt year. Became really nervous   Arthritis    Bradycardia    Breast cancer (HCC) 02/24/2016   s/p lumpectomy and radiation but refused chemotherapy   Chronic systolic CHF (congestive heart failure) (HCC)    Cough due to angiotensin-converting enzyme inhibitor    Deep venous thrombosis of lower extremity (HCC) 05/24/2023   Dizziness    Dizziness with standing after squatting, March, 2012   DVT (deep venous thrombosis) (HCC)    Dyslipidemia    LDL elevation,Patient does not want statin   Family history of breast cancer    GERD (gastroesophageal reflux disease)    H/O shortness of breath    CPX 10/18:  No evidence of cardiopulmonary limitation. Suspect exercise intolerance related to weight and deconditioning.    Hyperlipidemia 2007   Hypothyroidism    ICD (implantable cardiac defibrillator) battery depletion    Dr. Fernande, June, 2009(artifactual atrial tachycardia response events addressed by reprogramming in parentheses   Leg swelling    right leg   Mitral regurgitation    Myocardial  infarction [facility]) 2007   Nonischemic cardiomyopathy (HCC)    Etiology unknown, diagnosed 2008   Paroxysmal atrial fibrillation (HCC) 06/20/2022   PE (pulmonary thromboembolism) (HCC)    Personal history of radiation therapy 2017   Pneumonia due to COVID-19 virus 10/2019   Pulmonary embolism (HCC) 04/21/2020   Stroke (HCC) 2007   TIA (transient ischemic attack)    No CT or MRI abnormality, aspirin  therapy   TIA (transient ischemic attack) 2007   Past Surgical History:  Procedure Laterality Date   BREAST BIOPSY Left 02/24/2016   U/S Core   BREAST BIOPSY Left 02/24/2016   U/S Core   BREAST LUMPECTOMY Left 04/05/2016   invasive ductal    BREAST LUMPECTOMY WITH RADIOACTIVE SEED AND SENTINEL LYMPH NODE BIOPSY Left 04/05/2016   Procedure: BREAST LUMPECTOMY WITH RADIOACTIVE SEED AND SENTINEL LYMPH NODE BIOPSY;  Surgeon: Alm Angle, MD;  Location: [facility] OR;  Service: General;  Laterality: Left;   CARDIAC CATHETERIZATION N/A 08/05/2016   Procedure: Left Heart Cath and Coronary Angiography;  Surgeon: Peter M Jordan, MD;  Location: [facility] INVASIVE CV LAB;  Service: Cardiovascular;  Laterality: N/A;   CARDIAC DEFIBRILLATOR PLACEMENT  09   [employer] no remote   COLONOSCOPY     COLONOSCOPY N/A 01/19/2024   Procedure: COLONOSCOPY;  Surgeon: Cindie Carlin POUR, DO;  Location: [facility] ENDO SUITE;  Service: Endoscopy;  Laterality: N/A;  11:00 am, asa 3/4   CYST EXCISION Left    back of leg-lt    ESOPHAGOGASTRODUODENOSCOPY N/A 01/19/2024   Procedure: EGD (ESOPHAGOGASTRODUODENOSCOPY);  Surgeon: Cindie Carlin POUR, DO;  Location: [facility];  Service: Endoscopy;  Laterality: N/A;   IMPLANTABLE CARDIOVERTER DEFIBRILLATOR GENERATOR CHANGE N/A 12/29/2014   Procedure: IMPLANTABLE CARDIOVERTER DEFIBRILLATOR GENERATOR CHANGE;  Surgeon: Elspeth JAYSON Sage, MD;  Location: [facility] CATH LAB;  Service: Cardiovascular;  Laterality: N/A;   LATERAL EPICONDYLE RELEASE  10/11/2012   Procedure: TENNIS ELBOW RELEASE;  Surgeon: Toribio JULIANNA Chancy, MD;  Location: [facility];  Service: Orthopedics;  Laterality: Right;  RIGHT ELBOW: TENOTOMY ELBOW LATERAL EPICONDYLITIS TENNIS ELBOW: RADIAL TUNNEL RELEASE   POLYPECTOMY  01/19/2024   Procedure: POLYPECTOMY;  Surgeon: Cindie Carlin POUR, DO;  Location: [facility];  Service: Endoscopy;;   PORT-A-CATH REMOVAL  04/13/2016   Procedure: MINOR REMOVAL PORT-A-CATH;  Surgeon: Donnice Lima, MD;  Location: [facility];  Service: General;;   PORTACATH PLACEMENT Right 04/05/2016   Procedure: INSERTION PORT-A-CATH WITH US ;  Surgeon: Alm Angle, MD;  Location: [facility] OR;  Service: General;  Laterality: Right;  right IJ   TOTAL THYROIDECTOMY  2001   TUBAL LIGATION  94   UPPER GI ENDOSCOPY     Patient Active Problem List   Diagnosis Date Noted   IBS (irritable colon syndrome) 11/27/2023   H/O adenomatous polyp of colon 11/27/2023   Laryngopharyngeal reflux (LPR) 05/31/2023   Tinnitus of both ears 05/31/2023   Upper airway cough syndrome 11/28/2022   DOE (dyspnea on exertion) 11/28/2022   Paroxysmal atrial fibrillation (HCC) 06/20/2022   Urge incontinence of urine 05/17/2022   Obesity (BMI 30-39.9) 04/13/2022   Neuropathy 05/19/2021   Vestibulitis, vulvar 05/10/2021   Atrophic vaginitis 05/10/2021   Hypertensive disorder 05/10/2021   History of malignant neoplasm of breast 05/10/2021   Osteopenia 04/14/2021   NICM (nonischemic cardiomyopathy) (HCC) 07/23/2020   Post-surgical hypothyroidism 03/27/2020   Anemia 07/15/2019   Lumbar spondylosis with myelopathy 04/19/2019   Genetic testing 11/22/2016   Breast cancer- radiation June 2016 08/05/2016   Cardiac LV ejection fraction 30-35% 07/18/2016   Heart disease 04/22/2016   Breast cancer of upper-inner quadrant of left female breast (HCC) 03/09/2016   Chronic combined systolic and diastolic HF (heart failure) (HCC) 02/26/2014   Ejection fraction < 50%    Itching    Pure hypercholesterolemia    Leg swelling     Dual implantable cardioverter-defibrillator in situ    FOOT PAIN 01/20/2010   History of TIA 2007 01/19/2010    PCP: Jolinda Norene HERO, DO  REFERRING PROVIDER: Jolinda Norene HERO, DO  REFERRING DIAG: R29.898 (ICD-10-CM) - Pelvic girdle weakness  THERAPY DIAG:  Muscle weakness (generalized)  Rationale for Evaluation and Treatment: Rehabilitation  ONSET DATE: 07/08/24  SUBJECTIVE:   SUBJECTIVE STATEMENT: Pt states that she has had back issues for years.  It is mainly on her LT side she had bulging discs.  She states that she has Lt knee problems as well.  The MD recommended surgery but the insurance would not cover it.  She started having trouble getting out of her car and up from low chairs so she went to her MD who referred her to therapy.  Pt states that she is having some lateral thigh pain.   PERTINENT HISTORY:  Lumbar spondylosis with myelopathy Per MD visit 10/15:Weakness in her lower extremities She reports that she has been having about a 3-week history of  difficulty with mobility specifically getting in and out of the car and/or standing up from a seated position.  She does not have any difficulty when she is actually standing and/or walking around.  Denies any sensory changes or overt buckling of the lower extremities or sensation of weakness in the lower extremities when she is able to get into a standing position.  She denies any preceding illness or injury causing the symptoms.  She has been evaluated for her back before with [facility] and was told that she does have a slipped disc in the low back- she is not quite sure when that last imaging was done.   PAIN:  Are you having pain? Yes: Pain location: back  Pain description: aching Aggravating factors: sitting, standing and walking. Relieving factors: tylenol   Level 7/10 PRECAUTIONS: None  RED FLAGS: None   WEIGHT BEARING RESTRICTIONS: No  FALLS:  Has patient fallen in last 6 months? No  LIVING  ENVIRONMENT: Lives with: lives with their family and lives alone Lives in: House/apartment Stairs: No Has following equipment at home: Single point cane does not use all the time.   OCCUPATION: not employed  PLOF: Independent  PATIENT GOALS: less pain, be able to get up easier.   NEXT MD VISIT: none set  OBJECTIVE:  Note: Objective measures were completed at Evaluation unless otherwise noted.  DIAGNOSTIC FINDINGS: knee FINDINGS: No fracture or dislocation. Preserved joint spaces and bone mineralization. Hyperostosis about the patella.   IMPRESSION: No acute osseous abnormality.     Electronically Signed   By: Ranell Bring M.D.   On: 02/01/2024 11:32    PATIENT SURVEYS:  Modified Oswestry:  MODIFIED OSWESTRY DISABILITY SCALE  Date: 08/27/24 Score  Pain intensity 4 =  Pain medication provides me with little relief from pain.  2. Personal care (washing, dressing, etc.) 2 =  It is painful to take care of myself, and I am slow and careful.  3. Lifting 4 = I can lift only very light weights  4. Walking 2 =  Pain prevents me from walking more than  mile.  5. Sitting 2 =  Pain prevents me from sitting more than 1 hour.  6. Standing 2 =  Pain prevents me from standing more than 1 hour  7. Sleeping 3 =  Even when I take pain medication, I sleep less than 4 hours.  8. Social Life 1 =  My social life is normal, but it increases my level of pain.  9. Traveling 2 =  My pain restricts my travel over 2 hours.  10. Employment/ Homemaking 2 = I can perform most of my homemaking/job duties, but pain prevents me from performing more physically stressful activities (eg, lifting, vacuuming).  Total 24/50  COGNITION: Overall cognitive status: Within functional limits for tasks assessed   Posture:  decreased kyphosis,    LOWER EXTREMITY ROM: WFL  Lumbar extension:  15 degrees with reps causing  Lumbar flexion:  finger tips to knees mainly hip motion ; reps no change   LOWER EXTREMITY  MMT:  MMT Right eval Left eval  Hip flexion 3+/5 4+/5  Hip extension 3-/5 3/5  Hip abduction 5-/5 4/5  Hip adduction    Hip internal rotation    Hip external rotation    Knee flexion 4-/5 4-/5  Knee extension 5/5 3/5  Ankle dorsiflexion 4/5 4+/5  Ankle plantarflexion    Ankle inversion    Ankle eversion     (Blank rows = not tested)  FUNCTIONAL TESTS:  30 seconds chair stand test: 6 x started pushing hands on knees 2 minute walk test: 482 ft                                                                                                                                 TREATMENT DATE: 08/27/24  Evaluation  Seated Transversus Abdominis Bracing  - 5 reps - 5 hold - Seated Long Arc Quad  10 reps - 5 hold - Single Knee to Chest Stretch  1 reps - 30 hold - Supine Bridge  10 reps - 10 hold  PATIENT EDUCATION:  Education details: HEP, body mechanics for bed mobility  Person educated: Patient Education method: Explanation Education comprehension: verbalized understanding  HOME EXERCISE PROGRAM: Access Code: [email or web address] URL: [email or web address] Date: 08/27/2024 Prepared by: Montie Metro  Exercises - Seated Transversus Abdominis Bracing  - 2 x daily - 7 x weekly - 1 sets - 10 reps - 5 hold - Seated Long Arc Quad  - 2 x daily - 7 x weekly - 1 sets - 10 reps - 5 hold - Single Knee to Chest Stretch  - 2 x daily - 7 x weekly - 1 sets - 3 reps - 30 hold - Supine Bridge  - 2 x daily - 7 x weekly - 1 sets - 10 reps - 10 hold  ASSESSMENT:  CLINICAL IMPRESSION: Patient is a 63 y.o. female who was seen today for physical therapy evaluation and treatment for weak pelvic girdle causing back and knee pain. Evaluation demonstrates decreased activity tolerance, decreased strength, decreased ROM and increased pain.  Ms. San will benefit from skilled PT to address these issues and maximize her functional ability   OBJECTIVE IMPAIRMENTS: decreased activity  tolerance, decreased mobility, difficulty walking, decreased ROM, decreased strength, and pain.   ACTIVITY LIMITATIONS: carrying, lifting, bending, sitting, standing, squatting, and locomotion level  PARTICIPATION LIMITATIONS: cleaning, shopping, and community activity  PERSONAL FACTORS: Time since onset of injury/illness/exacerbation are also affecting patient's functional outcome.   REHAB POTENTIAL: Good  CLINICAL DECISION MAKING: Evolving/moderate complexity  EVALUATION COMPLEXITY: Moderate   GOALS: Goals reviewed with patient? No  SHORT TERM GOALS: Target date: 09/17/24 Pt to be I in HEP to decrease her pain to be no greater than a 5/10  Baseline: Goal status: INITIAL  2.  PT to be able to get out of her car with ease Baseline:  Goal status: INITIAL  3.  Pt to be using good body mechanics for lifting and bed mobility to decrease stress on back.   Baseline:  Goal status: INITIAL  4.  PT able to complete 9 sit to stand to demonstrate improved functional strength.  Baseline:  Goal status: INITIAL   LONG TERM GOALS: Target date: 10/08/24  Pt to be I in an advanced HEP to decrease her pain to be no greater than a 3/10  Baseline:  Goal status: INITIAL  2.  PT  to be able to get up from a couch without using her hands  Baseline:  Goal status: INITIAL  3.  PT able to complete 12 sit to stand to demonstrate improved functional strength.  Baseline:  Goal status: INITIAL  4.  PT modified oswestry score to improve by 5 points Baseline:  Goal status: INITIAL   PLAN:  PT FREQUENCY: 2x/week  PT DURATION: 6 weeks  PLANNED INTERVENTIONS: 97110-Therapeutic exercises, 97530- Therapeutic activity, [id number]- Neuromuscular re-education, 97535- Self Care, [id number]- Manual therapy, and Patient/Family education  PLAN FOR NEXT SESSION: Progress strengthening and flexibility as able.   Montie Metro, PT CLT [phone]  08/27/2024, 3:46 PM

## 2024-08-29 ENCOUNTER — Ambulatory Visit: Payer: Self-pay | Admitting: Cardiovascular Disease

## 2024-09-02 ENCOUNTER — Ambulatory Visit

## 2024-09-02 ENCOUNTER — Telehealth: Payer: Self-pay | Admitting: Internal Medicine

## 2024-09-02 MED ORDER — VALSARTAN 80 MG PO TABS: 80.0000 mg | ORAL_TABLET | Freq: Every day | ORAL | 1 refills | Status: AC

## 2024-09-02 NOTE — Telephone Encounter (Signed)
*  STAT* If patient is at the pharmacy, call can be transferred to refill team.   1. Which medications need to be refilled? (please list name of each medication and dose if known) valsartan  (DIOVAN ) 80 MG tablet   2. Which pharmacy/location (including street and city if local pharmacy) is medication to be sent to? [facility] [address] [place] - [address] Phone: [phone]  Fax: [phone]      3. Do they need a 30 day or 90 day supply? 30

## 2024-09-02 NOTE — Telephone Encounter (Signed)
 Refill sent.

## 2024-09-03 ENCOUNTER — Ambulatory Visit (HOSPITAL_COMMUNITY): Admitting: Physical Therapy

## 2024-09-03 ENCOUNTER — Ambulatory Visit: Attending: Family Medicine

## 2024-09-03 DIAGNOSIS — M5459 Other low back pain: Secondary | ICD-10-CM | POA: Insufficient documentation

## 2024-09-03 DIAGNOSIS — M6281 Muscle weakness (generalized): Secondary | ICD-10-CM | POA: Insufficient documentation

## 2024-09-03 NOTE — Therapy (Signed)
 OUTPATIENT PHYSICAL THERAPY LOWER EXTREMITY TREATMENT   Patient Name: [name] [name] MRN: [id number] DOB:Dec 26, 1960, 63 y.o., female Today's Date: 09/03/2024  END OF SESSION:  PT End of Session - 09/03/24 1436     Visit Number 2    Number of Visits 12    Date for Recertification  10/08/24    Authorization Type [employer] medicare/medicaid    PT Start Time 1430    PT Stop Time 1512    PT Time Calculation (min) 42 min    Activity Tolerance Patient tolerated treatment well    Behavior During Therapy [facility] for tasks assessed/performed          Past Medical History:  Diagnosis Date   A-fib (HCC)    Acute deep vein thrombosis (DVT) of right popliteal vein (HCC) 04/13/2022   Acute pulmonary embolism (HCC) 04/12/2022   AICD (automatic cardioverter/defibrillator) present 2009   [employer]- replaced 2016   Allergy    Just seasonal   Anxiety 08/24   Just noticed when i tried to sing at church ladt year. Became really nervous   Arthritis    Bradycardia    Breast cancer (HCC) 02/24/2016   s/p lumpectomy and radiation but refused chemotherapy   Chronic systolic CHF (congestive heart failure) (HCC)    Cough due to angiotensin-converting enzyme inhibitor    Deep venous thrombosis of lower extremity (HCC) 05/24/2023   Dizziness    Dizziness with standing after squatting, March, 2012   DVT (deep venous thrombosis) (HCC)    Dyslipidemia    LDL elevation,Patient does not want statin   Family history of breast cancer    GERD (gastroesophageal reflux disease)    H/O shortness of breath    CPX 10/18:  No evidence of cardiopulmonary limitation. Suspect exercise intolerance related to weight and deconditioning.    Hyperlipidemia 2007   Hypothyroidism    ICD (implantable cardiac defibrillator) battery depletion    Dr. Fernande, June, 2009(artifactual atrial tachycardia response events addressed by reprogramming in parentheses   Leg swelling    right leg   Mitral regurgitation    Myocardial  infarction [facility]) 2007   Nonischemic cardiomyopathy (HCC)    Etiology unknown, diagnosed 2008   Paroxysmal atrial fibrillation (HCC) 06/20/2022   PE (pulmonary thromboembolism) (HCC)    Personal history of radiation therapy 2017   Pneumonia due to COVID-19 virus 10/2019   Pulmonary embolism (HCC) 04/21/2020   Stroke (HCC) 2007   TIA (transient ischemic attack)    No CT or MRI abnormality, aspirin  therapy   TIA (transient ischemic attack) 2007   Past Surgical History:  Procedure Laterality Date   BREAST BIOPSY Left 02/24/2016   U/S Core   BREAST BIOPSY Left 02/24/2016   U/S Core   BREAST LUMPECTOMY Left 04/05/2016   invasive ductal    BREAST LUMPECTOMY WITH RADIOACTIVE SEED AND SENTINEL LYMPH NODE BIOPSY Left 04/05/2016   Procedure: BREAST LUMPECTOMY WITH RADIOACTIVE SEED AND SENTINEL LYMPH NODE BIOPSY;  Surgeon: Alm Angle, MD;  Location: [facility] OR;  Service: General;  Laterality: Left;   CARDIAC CATHETERIZATION N/A 08/05/2016   Procedure: Left Heart Cath and Coronary Angiography;  Surgeon: Peter M Jordan, MD;  Location: [facility] INVASIVE CV LAB;  Service: Cardiovascular;  Laterality: N/A;   CARDIAC DEFIBRILLATOR PLACEMENT  09   [employer] no remote   COLONOSCOPY     COLONOSCOPY N/A 01/19/2024   Procedure: COLONOSCOPY;  Surgeon: Cindie Carlin POUR, DO;  Location: [facility] ENDO SUITE;  Service: Endoscopy;  Laterality: N/A;  11:00 am, asa 3/4   CYST EXCISION Left    back of leg-lt    ESOPHAGOGASTRODUODENOSCOPY N/A 01/19/2024   Procedure: EGD (ESOPHAGOGASTRODUODENOSCOPY);  Surgeon: Cindie Carlin POUR, DO;  Location: [facility];  Service: Endoscopy;  Laterality: N/A;   IMPLANTABLE CARDIOVERTER DEFIBRILLATOR GENERATOR CHANGE N/A 12/29/2014   Procedure: IMPLANTABLE CARDIOVERTER DEFIBRILLATOR GENERATOR CHANGE;  Surgeon: Elspeth JAYSON Sage, MD;  Location: [facility] CATH LAB;  Service: Cardiovascular;  Laterality: N/A;   LATERAL EPICONDYLE RELEASE  10/11/2012   Procedure: TENNIS ELBOW RELEASE;  Surgeon: Toribio JULIANNA Chancy, MD;  Location: [facility];  Service: Orthopedics;  Laterality: Right;  RIGHT ELBOW: TENOTOMY ELBOW LATERAL EPICONDYLITIS TENNIS ELBOW: RADIAL TUNNEL RELEASE   POLYPECTOMY  01/19/2024   Procedure: POLYPECTOMY;  Surgeon: Cindie Carlin POUR, DO;  Location: [facility];  Service: Endoscopy;;   PORT-A-CATH REMOVAL  04/13/2016   Procedure: MINOR REMOVAL PORT-A-CATH;  Surgeon: Donnice Lima, MD;  Location:  SURGERY CENTER;  Service: General;;   PORTACATH PLACEMENT Right 04/05/2016   Procedure: INSERTION PORT-A-CATH WITH US ;  Surgeon: Alm Angle, MD;  Location: [facility] OR;  Service: General;  Laterality: Right;  right IJ   TOTAL THYROIDECTOMY  2001   TUBAL LIGATION  94   UPPER GI ENDOSCOPY     Patient Active Problem List   Diagnosis Date Noted   IBS (irritable colon syndrome) 11/27/2023   H/O adenomatous polyp of colon 11/27/2023   Laryngopharyngeal reflux (LPR) 05/31/2023   Tinnitus of both ears 05/31/2023   Upper airway cough syndrome 11/28/2022   DOE (dyspnea on exertion) 11/28/2022   Paroxysmal atrial fibrillation (HCC) 06/20/2022   Urge incontinence of urine 05/17/2022   Obesity (BMI 30-39.9) 04/13/2022   Neuropathy 05/19/2021   Vestibulitis, vulvar 05/10/2021   Atrophic vaginitis 05/10/2021   Hypertensive disorder 05/10/2021   History of malignant neoplasm of breast 05/10/2021   Osteopenia 04/14/2021   NICM (nonischemic cardiomyopathy) (HCC) 07/23/2020   Post-surgical hypothyroidism 03/27/2020   Anemia 07/15/2019   Lumbar spondylosis with myelopathy 04/19/2019   Genetic testing 11/22/2016   Breast cancer- radiation June 2016 08/05/2016   Cardiac LV ejection fraction 30-35% 07/18/2016   Heart disease 04/22/2016   Breast cancer of upper-inner quadrant of left female breast (HCC) 03/09/2016   Chronic combined systolic and diastolic HF (heart failure) (HCC) 02/26/2014   Ejection fraction < 50%    Itching    Pure hypercholesterolemia    Leg swelling     Dual implantable cardioverter-defibrillator in situ    FOOT PAIN 01/20/2010   History of TIA 2007 01/19/2010    PCP: Jolinda Norene HERO, DO  REFERRING PROVIDER: Jolinda Norene HERO, DO  REFERRING DIAG: R29.898 (ICD-10-CM) - Pelvic girdle weakness  THERAPY DIAG:  Muscle weakness (generalized)  Other low back pain  Rationale for Evaluation and Treatment: Rehabilitation  ONSET DATE: 07/08/24  SUBJECTIVE:   SUBJECTIVE STATEMENT: Pt denies any pain today.  Has another appt with ortho coming up.   PERTINENT HISTORY:  Lumbar spondylosis with myelopathy Per MD visit 10/15:Weakness in her lower extremities She reports that she has been having about a 3-week history of difficulty with mobility specifically getting in and out of the car and/or standing up from a seated position.  She does not have any difficulty when she is actually standing and/or walking around.  Denies any sensory changes or overt buckling of the lower extremities or sensation of weakness in the lower extremities when she is able to get into a standing position.  She denies any preceding illness or injury causing the symptoms.  She has been evaluated for her back before with [facility] and was told that she does have a slipped disc in the low back- she is not quite sure when that last imaging was done.   PAIN:  Are you having pain? Yes: Pain location: back  Pain description: aching Aggravating factors: sitting, standing and walking. Relieving factors: tylenol   Level 7/10 PRECAUTIONS: None  RED FLAGS: None   WEIGHT BEARING RESTRICTIONS: No  FALLS:  Has patient fallen in last 6 months? No  LIVING ENVIRONMENT: Lives with: lives with their family and lives alone Lives in: House/apartment Stairs: No Has following equipment at home: Single point cane does not use all the time.   OCCUPATION: not employed  PLOF: Independent  PATIENT GOALS: less pain, be able to get up easier.   NEXT MD VISIT: none  set  OBJECTIVE:  Note: Objective measures were completed at Evaluation unless otherwise noted.  DIAGNOSTIC FINDINGS: knee FINDINGS: No fracture or dislocation. Preserved joint spaces and bone mineralization. Hyperostosis about the patella.   IMPRESSION: No acute osseous abnormality.     Electronically Signed   By: Ranell Bring M.D.   On: 02/01/2024 11:32    PATIENT SURVEYS:  Modified Oswestry:  MODIFIED OSWESTRY DISABILITY SCALE  Date: 08/27/24 Score  Pain intensity 4 =  Pain medication provides me with little relief from pain.  2. Personal care (washing, dressing, etc.) 2 =  It is painful to take care of myself, and I am slow and careful.  3. Lifting 4 = I can lift only very light weights  4. Walking 2 =  Pain prevents me from walking more than  mile.  5. Sitting 2 =  Pain prevents me from sitting more than 1 hour.  6. Standing 2 =  Pain prevents me from standing more than 1 hour  7. Sleeping 3 =  Even when I take pain medication, I sleep less than 4 hours.  8. Social Life 1 =  My social life is normal, but it increases my level of pain.  9. Traveling 2 =  My pain restricts my travel over 2 hours.  10. Employment/ Homemaking 2 = I can perform most of my homemaking/job duties, but pain prevents me from performing more physically stressful activities (eg, lifting, vacuuming).  Total 24/50  COGNITION: Overall cognitive status: Within functional limits for tasks assessed   Posture:  decreased kyphosis,    LOWER EXTREMITY ROM: WFL  Lumbar extension:  15 degrees with reps causing  Lumbar flexion:  finger tips to knees mainly hip motion ; reps no change   LOWER EXTREMITY MMT:  MMT Right eval Left eval  Hip flexion 3+/5 4+/5  Hip extension 3-/5 3/5  Hip abduction 5-/5 4/5  Hip adduction    Hip internal rotation    Hip external rotation    Knee flexion 4-/5 4-/5  Knee extension 5/5 3/5  Ankle dorsiflexion 4/5 4+/5  Ankle plantarflexion    Ankle inversion    Ankle  eversion     (Blank rows = not tested)    FUNCTIONAL TESTS:  30 seconds chair stand test: 6 x started pushing hands on knees 2 minute walk test: 482 ft  TREATMENT DATE:   09/03/24                                  EXERCISE LOG  Exercise Repetitions and Resistance Comments  Nustep  Lvl 1 x 15 mins   Rockerboard 3 mins   LAQs 2# x 20 reps bil   Seated Marches 2# x 20 reps bil   Seated Hip Abduction Red x 3 mins   Seated Hip Adduction 3 mins   Seated Ham Curls Red x 20 reps bil   STSs     Blank cell = exercise not performed today   08/27/24  Evaluation  Seated Transversus Abdominis Bracing  - 5 reps - 5 hold - Seated Long Arc Quad  10 reps - 5 hold - Single Knee to Chest Stretch  1 reps - 30 hold - Supine Bridge  10 reps - 10 hold  PATIENT EDUCATION:  Education details: HEP, body mechanics for bed mobility  Person educated: Patient Education method: Explanation Education comprehension: verbalized understanding  HOME EXERCISE PROGRAM: Access Code: [email or web address] URL: [email or web address] Date: 08/27/2024 Prepared by: Montie Metro  Exercises - Seated Transversus Abdominis Bracing  - 2 x daily - 7 x weekly - 1 sets - 10 reps - 5 hold - Seated Long Arc Quad  - 2 x daily - 7 x weekly - 1 sets - 10 reps - 5 hold - Single Knee to Chest Stretch  - 2 x daily - 7 x weekly - 1 sets - 3 reps - 30 hold - Supine Bridge  - 2 x daily - 7 x weekly - 1 sets - 10 reps - 10 hold  ASSESSMENT:  CLINICAL IMPRESSION: Pt arrives for today's treatment session reporting minimal left knee pain, but does report increased left knee edema today.  Pt instructed in standing and seated exercises today for BLE strengthening.  Pt requiring min cues for proper technique and posture.  Discussed importance of safety at home to reduce fall risk, with  concentration on shower safety.  Pt denied any change in pain at completion of today's treatment session.  OBJECTIVE IMPAIRMENTS: decreased activity tolerance, decreased mobility, difficulty walking, decreased ROM, decreased strength, and pain.   ACTIVITY LIMITATIONS: carrying, lifting, bending, sitting, standing, squatting, and locomotion level  PARTICIPATION LIMITATIONS: cleaning, shopping, and community activity  PERSONAL FACTORS: Time since onset of injury/illness/exacerbation are also affecting patient's functional outcome.   REHAB POTENTIAL: Good  CLINICAL DECISION MAKING: Evolving/moderate complexity  EVALUATION COMPLEXITY: Moderate   GOALS: Goals reviewed with patient? No  SHORT TERM GOALS: Target date: 09/17/24 Pt to be I in HEP to decrease her pain to be no greater than a 5/10  Baseline: Goal status: INITIAL  2.  PT to be able to get out of her car with ease Baseline:  Goal status: INITIAL  3.  Pt to be using good body mechanics for lifting and bed mobility to decrease stress on back.   Baseline:  Goal status: INITIAL  4.  PT able to complete 9 sit to stand to demonstrate improved functional strength.  Baseline:  Goal status: INITIAL   LONG TERM GOALS: Target date: 10/08/24  Pt to be I in an advanced HEP to decrease her pain to be no greater than a 3/10  Baseline:  Goal status: INITIAL  2.  PT to be able to get up from a couch without using her hands  Baseline:  Goal status:  INITIAL  3.  PT able to complete 12 sit to stand to demonstrate improved functional strength.  Baseline:  Goal status: INITIAL  4.  PT modified oswestry score to improve by 5 points Baseline:  Goal status: INITIAL   PLAN:  PT FREQUENCY: 2x/week  PT DURATION: 6 weeks  PLANNED INTERVENTIONS: [id number]-Therapeutic exercises, 97530- Therapeutic activity, [id number]- Neuromuscular re-education, [id number]- Self Care, [id number]- Manual therapy, and Patient/Family education  PLAN FOR NEXT  SESSION: Progress strengthening and flexibility as able.   Delon Gosling, PTA  09/03/2024, 3:27 PM

## 2024-09-10 ENCOUNTER — Ambulatory Visit (HOSPITAL_COMMUNITY): Admitting: Physical Therapy

## 2024-09-11 ENCOUNTER — Ambulatory Visit (HOSPITAL_BASED_OUTPATIENT_CLINIC_OR_DEPARTMENT_OTHER): Admitting: Orthopaedic Surgery

## 2024-09-11 ENCOUNTER — Ambulatory Visit

## 2024-09-11 DIAGNOSIS — M25562 Pain in left knee: Secondary | ICD-10-CM | POA: Diagnosis not present

## 2024-09-11 MED ORDER — TRIAMCINOLONE ACETONIDE 40 MG/ML IJ SUSP
80.0000 mg | INTRAMUSCULAR | Status: AC | PRN
  Administered 2024-09-11: 80 mg via INTRA_ARTICULAR

## 2024-09-11 MED ORDER — LIDOCAINE HCL 1 % IJ SOLN
4.0000 mL | INTRAMUSCULAR | Status: AC | PRN
  Administered 2024-09-11: 4 mL

## 2024-09-11 NOTE — Progress Notes (Signed)
 Chief Complaint: Left knee pain     History of Present Illness:   09/11/2024: Presents today for follow-up of the left knee.  At this time she has been having persistent pain and is hoping to get an injection in this knee  [name] [name] is a 63 y.o. female presents with ongoing left knee pain.  She has been having pain for the past 2 weeks.  She does state that she felt an injury where she woke up with persistent swelling and inability to put weight medially.  This is somewhat gotten better over the course of the last several days with therapy and strengthening although she does have persistent swelling.  She has not had any previous history of surgeries.  She is currently disabled although does enjoy being active and walking.  She is here today for further discussion    PMH/PSH/Family History/Social History/Meds/Allergies:    Past Medical History:  Diagnosis Date  . A-fib (HCC)   . Acute deep vein thrombosis (DVT) of right popliteal vein (HCC) 04/13/2022  . Acute pulmonary embolism (HCC) 04/12/2022  . AICD (automatic cardioverter/defibrillator) present 2009   [employer]- replaced 2016  . Allergy    Just seasonal  . Anxiety 08/24   Just noticed when i tried to sing at church ladt year. Became really nervous  . Arthritis   . Bradycardia   . Breast cancer (HCC) 02/24/2016   s/p lumpectomy and radiation but refused chemotherapy  . Chronic systolic CHF (congestive heart failure) (HCC)   . Cough due to angiotensin-converting enzyme inhibitor   . Deep venous thrombosis of lower extremity (HCC) 05/24/2023  . Dizziness    Dizziness with standing after squatting, March, 2012  . DVT (deep venous thrombosis) (HCC)   . Dyslipidemia    LDL elevation,Patient does not want statin  . Family history of breast cancer   . GERD (gastroesophageal reflux disease)   . H/O shortness of breath    CPX 10/18:  No evidence of cardiopulmonary limitation. Suspect exercise intolerance related to  weight and deconditioning.   SABRA Hyperlipidemia 2007  . Hypothyroidism   . ICD (implantable cardiac defibrillator) battery depletion    Dr. Fernande, June, 2009(artifactual atrial tachycardia response events addressed by reprogramming in parentheses  . Leg swelling    right leg  . Mitral regurgitation   . Myocardial infarction (HCC) 2007  . Nonischemic cardiomyopathy (HCC)    Etiology unknown, diagnosed 2008  . Paroxysmal atrial fibrillation (HCC) 06/20/2022  . PE (pulmonary thromboembolism) (HCC)   . Personal history of radiation therapy 2017  . Pneumonia due to COVID-19 virus 10/2019  . Pulmonary embolism (HCC) 04/21/2020  . Stroke [facility]) 2007  . TIA (transient ischemic attack)    No CT or MRI abnormality, aspirin  therapy  . TIA (transient ischemic attack) 2007   Past Surgical History:  Procedure Laterality Date  . BREAST BIOPSY Left 02/24/2016   U/S Core  . BREAST BIOPSY Left 02/24/2016   U/S Core  . BREAST LUMPECTOMY Left 04/05/2016   invasive ductal   . BREAST LUMPECTOMY WITH RADIOACTIVE SEED AND SENTINEL LYMPH NODE BIOPSY Left 04/05/2016   Procedure: BREAST LUMPECTOMY WITH RADIOACTIVE SEED AND SENTINEL LYMPH NODE BIOPSY;  Surgeon: Alm Angle, MD;  Location: [facility] OR;  Service: General;  Laterality: Left;  . CARDIAC CATHETERIZATION N/A 08/05/2016   Procedure: Left Heart Cath and Coronary Angiography;  Surgeon: Peter M Jordan, MD;  Location: [facility] INVASIVE CV LAB;  Service: Cardiovascular;  Laterality: N/A;  .  CARDIAC DEFIBRILLATOR PLACEMENT  [address] [employer] no remote  . COLONOSCOPY    . COLONOSCOPY N/A 01/19/2024   Procedure: COLONOSCOPY;  Surgeon: Cindie Carlin POUR, DO;  Location: [facility] ENDO SUITE;  Service: Endoscopy;  Laterality: N/A;  11:00 am, asa 3/4  . CYST EXCISION Left    back of leg-lt   . ESOPHAGOGASTRODUODENOSCOPY N/A 01/19/2024   Procedure: EGD (ESOPHAGOGASTRODUODENOSCOPY);  Surgeon: Cindie Carlin POUR, DO;  Location: [facility];  Service: Endoscopy;  Laterality:  N/A;  . IMPLANTABLE CARDIOVERTER DEFIBRILLATOR GENERATOR CHANGE N/A 12/29/2014   Procedure: IMPLANTABLE CARDIOVERTER DEFIBRILLATOR GENERATOR CHANGE;  Surgeon: Elspeth JAYSON Sage, MD;  Location: [facility] CATH LAB;  Service: Cardiovascular;  Laterality: N/A;  . LATERAL EPICONDYLE RELEASE  10/11/2012   Procedure: TENNIS ELBOW RELEASE;  Surgeon: Toribio JULIANNA Chancy, MD;  Location: [facility];  Service: Orthopedics;  Laterality: Right;  RIGHT ELBOW: TENOTOMY ELBOW LATERAL EPICONDYLITIS TENNIS ELBOW: RADIAL TUNNEL RELEASE  . POLYPECTOMY  01/19/2024   Procedure: POLYPECTOMY;  Surgeon: Cindie Carlin POUR, DO;  Location: [facility];  Service: Endoscopy;;  . PORT-A-CATH REMOVAL  04/13/2016   Procedure: MINOR REMOVAL PORT-A-CATH;  Surgeon: Donnice Lima, MD;  Location: [facility];  Service: General;;  . PORTACATH PLACEMENT Right 04/05/2016   Procedure: INSERTION PORT-A-CATH WITH US ;  Surgeon: Alm Angle, MD;  Location: [facility] OR;  Service: General;  Laterality: Right;  right IJ  . TOTAL THYROIDECTOMY  2001  . TUBAL LIGATION  94  . UPPER GI ENDOSCOPY     Social History   Socioeconomic History  . Marital status: Widowed    Spouse name: Not on file  . Number of children: Not on file  . Years of education: Not on file  . Highest education level: Some college, no degree  Occupational History  . Occupation: Widow  Tobacco Use  . Smoking status: Never  . Smokeless tobacco: Never  Vaping Use  . Vaping status: Never Used  Substance and Sexual Activity  . Alcohol use: No  . Drug use: No  . Sexual activity: Not Currently    Birth control/protection: Surgical    Comment: tubal  Other Topics Concern  . Not on file  Social History Narrative  . Not on file   Social Drivers of Health   Financial Resource Strain: High Risk (08/22/2024)   Overall Financial Resource Strain (CARDIA)   . Difficulty of Paying Living Expenses: Hard  Food Insecurity: Food Insecurity Present (08/22/2024)    Hunger Vital Sign   . Worried About Programme Researcher, Broadcasting/film/video in the Last Year: Sometimes true   . Ran Out of Food in the Last Year: Sometimes true  Transportation Needs: No Transportation Needs (08/22/2024)   PRAPARE - Transportation   . Lack of Transportation (Medical): No   . Lack of Transportation (Non-Medical): No  Physical Activity: Insufficiently Active (08/22/2024)   Exercise Vital Sign   . Days of Exercise per Week: 2 days   . Minutes of Exercise per Session: 20 min  Stress: No Stress Concern Present (08/22/2024)   [employer] of Occupational Health - Occupational Stress Questionnaire   . Feeling of Stress: Not at all  Social Connections: Moderately Integrated (08/22/2024)   Social Connection and Isolation Panel   . Frequency of Communication with Friends and Family: More than three times a week   . Frequency of Social Gatherings with Friends and Family: Three times a week   . Attends Religious Services: More than 4 times per  year   . Active Member of Clubs or Organizations: Yes   . Attends Banker Meetings: 1 to 4 times per year   . Marital Status: Widowed   Family History  Problem Relation Age of Onset  . Heart attack Mother   . Cancer Mother        breast  . Breast cancer Mother 4  . Arthritis Mother   . Heart disease Mother   . Hypertension Sister   . Arthritis Sister   . Hypertension Brother   . Arthritis Brother   . Cancer Paternal Grandmother        possible cancer, unknown type  . Cancer Maternal Aunt        breast  . Breast cancer Maternal Aunt   . Diabetes Brother   . Miscarriages / Stillbirths Daughter   . Obesity Daughter   . Obesity Son   . Stroke Neg Hx   . Colon cancer Neg Hx    Allergies  Allergen Reactions  . Avelox [Moxifloxacin Hcl In Nacl] Shortness Of Breath    Shortness of breath  . Clindamycin/Lincomycin Shortness Of Breath and Diarrhea    Breathing/diarrhea    . Kiwi Extract Anaphylaxis    Makes pt. Feel like her  throat is closing   . Bactrim [Sulfamethoxazole-Trimethoprim] Swelling  . Levofloxacin Other (See Comments)    JOINT PAIN JOINT PAIN  . Simvastatin Other (See Comments)    Joint pain  . Spironolactone  Other (See Comments)    burning  . Moxifloxacin Hcl Other (See Comments)  . Tramadol  Nausea Only   Current Outpatient Medications  Medication Sig Dispense Refill  . albuterol  (VENTOLIN  HFA) 108 (90 Base) MCG/ACT inhaler Inhale 2 puffs into the lungs every 6 (six) hours as needed for wheezing or shortness of breath. 8 g 0  . apixaban  (ELIQUIS ) 5 MG TABS tablet Take 1 tablet by mouth twice daily 180 tablet 1  . carvedilol  (COREG ) 3.125 MG tablet Take 1 tablet (3.125 mg total) by mouth 2 (two) times daily. NEEDS FOLLOW UP APPOINTMENT FOR ANYMORE REFILLS 180 tablet 2  . eplerenone  (INSPRA ) 25 MG tablet TAKE 1 TABLET BY MOUTH ONCE DAILY. 90 tablet 2  . fluticasone  (FLONASE ) 50 MCG/ACT nasal spray Place 2 sprays into both nostrils daily. 16 g 6  . levothyroxine  (SYNTHROID ) 75 MCG tablet Take 1 tablet (75 mcg total) by mouth daily before breakfast. 90 tablet 4  . linaclotide  (LINZESS ) 290 MCG CAPS capsule Take 1 capsule (290 mcg total) by mouth daily before breakfast. 30 capsule 5  . loratadine (CLARITIN) 10 MG tablet Take 10 mg by mouth as needed.    . predniSONE  (DELTASONE ) 10 MG tablet Take 1.5 tablets (15 mg total) by mouth daily with breakfast. 45 tablet 0  . rosuvastatin  (CRESTOR ) 20 MG tablet Take 1 tablet (20 mg total) by mouth daily. 90 tablet 3  . Suvorexant  (BELSOMRA ) 5 MG TABS Take 1 tablet (5 mg total) by mouth at bedtime as needed (sleep). 30 tablet 0  . traMADol  (ULTRAM ) 50 MG tablet Take 50 mg by mouth every 6 (six) hours as needed.    . valsartan  (DIOVAN ) 80 MG tablet Take 1 tablet (80 mg total) by mouth daily. 90 tablet 1   No current facility-administered medications for this visit.   No results found.  Review of Systems:   A ROS was performed including pertinent positives  and negatives as documented in the HPI.  Physical Exam :   Constitutional: NAD and  appears stated age Neurological: Alert and oriented Psych: Appropriate affect and cooperative There were no vitals taken for this visit.   Comprehensive Musculoskeletal Exam:    Left knee with tenderness about the medial patellofemoral joint with positive McMurray, range of motion is from -3 to 130 degrees.  Distal neurosensory exam is intact, negative Lachman, negative posterior drawer, no varus or valgus laxity with 0 or 30 degrees   Imaging:   Xray (4 views left knee): Normal     I personally reviewed and interpreted the radiographs.   Assessment and Plan:   63 y.o. female with left knee pain which I do believe to be consistent with a possible meniscal root injury.  That being said she is not able to obtain an MRI as a result of her pacemaker being incompatible.  Given this I did discuss treatment options for her knee injury.  I did discuss that we could ultimately perform a diagnostic arthroscopy with meniscal repair as needed.  I did discuss that this is somewhat of a complicated issue as her exam and history are quite consistent with a meniscal root injury but she is not able to obtain an MRI.  I did discuss the possibility and progression of osteoarthritis should she have an underlying meniscal root injury that is not picked up.  After discussion of risks and benefits she is ultimately elected for left knee arthroscopy with possible medial meniscal repair  -Plan for left knee arthroscopy with possible medial meniscal repair   After a lengthy discussion of treatment options, including risks, benefits, alternatives, complications of surgical and nonsurgical conservative options, the patient elected surgical repair.   The patient  is aware of the material risks  and complications including, but not limited to injury to adjacent structures, neurovascular injury, infection, numbness, bleeding, implant  failure, thermal burns, stiffness, persistent pain, failure to heal, disease transmission from allograft, need for further surgery, dislocation, anesthetic risks, blood clots, risks of death,and others. The probabilities of surgical success and failure discussed with patient given their particular co-morbidities.The time and nature of expected rehabilitation and recovery was discussed.The patient's questions were all answered preoperatively.  No barriers to understanding were noted. I explained the natural history of the disease process and Rx rationale.  I explained to the patient what I considered to be reasonable expectations given their personal situation.  The final treatment plan was arrived at through a shared patient decision making process model.     Procedure Note  Patient: [name] [name]             Date of Birth: 02-13-1961           MRN: [id number]             Visit Date: 09/11/2024  Procedures: Visit Diagnoses: No diagnosis found.  Large Joint Inj: L knee on 09/11/2024 5:13 PM Indications: pain Details: 22 G 1.5 in needle, ultrasound-guided anterior approach  Arthrogram: No  Medications: 4 mL lidocaine  1 %; 80 mg triamcinolone  acetonide 40 MG/ML Outcome: tolerated well, no immediate complications Procedure, treatment alternatives, risks and benefits explained, specific risks discussed. Consent was given by the patient. Immediately prior to procedure a time out was called to verify the correct patient, procedure, equipment, support staff and site/side marked as required. Patient was prepped and draped in the usual sterile fashion.         I personally saw and evaluated the patient, and participated in the management and treatment plan.  Elspeth Parker, MD Attending  Physician, Orthopedic Surgery  This document was dictated using Dragon voice recognition software. A reasonable attempt at proof reading has been made to minimize errors.

## 2024-09-12 ENCOUNTER — Other Ambulatory Visit: Payer: Self-pay | Admitting: Internal Medicine

## 2024-09-13 ENCOUNTER — Ambulatory Visit

## 2024-09-16 ENCOUNTER — Ambulatory Visit

## 2024-09-16 ENCOUNTER — Other Ambulatory Visit: Payer: Self-pay | Admitting: *Deleted

## 2024-09-16 DIAGNOSIS — M6281 Muscle weakness (generalized): Secondary | ICD-10-CM

## 2024-09-16 DIAGNOSIS — R0609 Other forms of dyspnea: Secondary | ICD-10-CM

## 2024-09-16 DIAGNOSIS — M5459 Other low back pain: Secondary | ICD-10-CM | POA: Diagnosis not present

## 2024-09-16 DIAGNOSIS — R058 Other specified cough: Secondary | ICD-10-CM

## 2024-09-16 MED ORDER — ALBUTEROL SULFATE HFA 108 (90 BASE) MCG/ACT IN AERS: 2.0000 | INHALATION_SPRAY | Freq: Four times a day (QID) | RESPIRATORY_TRACT | 0 refills | Status: AC | PRN

## 2024-09-16 NOTE — Therapy (Signed)
 OUTPATIENT PHYSICAL THERAPY LOWER EXTREMITY TREATMENT   Patient Name: [name] [name] MRN: [id number] DOB:[birth year], 63 y.o., female Today's Date: 09/16/2024  END OF SESSION:  PT End of Session - 09/16/24 1304     Visit Number 3    Number of Visits 12    Date for Recertification  10/08/24    Authorization Type [employer] medicare/medicaid    PT Start Time 1300    PT Stop Time 1336    PT Time Calculation (min) 36 min    Activity Tolerance Patient tolerated treatment well    Behavior During Therapy [facility] for tasks assessed/performed          Past Medical History:  Diagnosis Date   A-fib (HCC)    Acute deep vein thrombosis (DVT) of right popliteal vein (HCC) 04/13/2022   Acute pulmonary embolism (HCC) 04/12/2022   AICD (automatic cardioverter/defibrillator) present 2009   [employer]- replaced 2016   Allergy    Just seasonal   Anxiety 08/24   Just noticed when i tried to sing at church ladt year. Became really nervous   Arthritis    Bradycardia    Breast cancer (HCC) 02/24/2016   s/p lumpectomy and radiation but refused chemotherapy   Chronic systolic CHF (congestive heart failure) (HCC)    Cough due to angiotensin-converting enzyme inhibitor    Deep venous thrombosis of lower extremity (HCC) 05/24/2023   Dizziness    Dizziness with standing after squatting, March, 2012   DVT (deep venous thrombosis) (HCC)    Dyslipidemia    LDL elevation,Patient does not want statin   Family history of breast cancer    GERD (gastroesophageal reflux disease)    H/O shortness of breath    CPX 10/18:  No evidence of cardiopulmonary limitation. Suspect exercise intolerance related to weight and deconditioning.    Hyperlipidemia 2007   Hypothyroidism    ICD (implantable cardiac defibrillator) battery depletion    Dr. Fernande, June, 2009(artifactual atrial tachycardia response events addressed by reprogramming in parentheses   Leg swelling    right leg   Mitral regurgitation    Myocardial  infarction [facility]) 2007   Nonischemic cardiomyopathy (HCC)    Etiology unknown, diagnosed 2008   Paroxysmal atrial fibrillation (HCC) 06/20/2022   PE (pulmonary thromboembolism) (HCC)    Personal history of radiation therapy 2017   Pneumonia due to COVID-19 virus 10/2019   Pulmonary embolism (HCC) 04/21/2020   Stroke (HCC) 2007   TIA (transient ischemic attack)    No CT or MRI abnormality, aspirin  therapy   TIA (transient ischemic attack) 2007   Past Surgical History:  Procedure Laterality Date   BREAST BIOPSY Left 02/24/2016   U/S Core   BREAST BIOPSY Left 02/24/2016   U/S Core   BREAST LUMPECTOMY Left 04/05/2016   invasive ductal    BREAST LUMPECTOMY WITH RADIOACTIVE SEED AND SENTINEL LYMPH NODE BIOPSY Left 04/05/2016   Procedure: BREAST LUMPECTOMY WITH RADIOACTIVE SEED AND SENTINEL LYMPH NODE BIOPSY;  Surgeon: Alm Angle, MD;  Location: [facility] OR;  Service: General;  Laterality: Left;   CARDIAC CATHETERIZATION N/A 08/05/2016   Procedure: Left Heart Cath and Coronary Angiography;  Surgeon: Peter M Jordan, MD;  Location: [facility] INVASIVE CV LAB;  Service: Cardiovascular;  Laterality: N/A;   CARDIAC DEFIBRILLATOR PLACEMENT  09   [employer] no remote   COLONOSCOPY     COLONOSCOPY N/A 01/19/2024   Procedure: COLONOSCOPY;  Surgeon: Cindie Carlin POUR, DO;  Location: [facility];  Service: Endoscopy;  Laterality: N/A;  11:00 am, asa 3/4   CYST EXCISION Left    back of leg-lt    ESOPHAGOGASTRODUODENOSCOPY N/A 01/19/2024   Procedure: EGD (ESOPHAGOGASTRODUODENOSCOPY);  Surgeon: Cindie Carlin POUR, DO;  Location: [facility];  Service: Endoscopy;  Laterality: N/A;   IMPLANTABLE CARDIOVERTER DEFIBRILLATOR GENERATOR CHANGE N/A 12/29/2014   Procedure: IMPLANTABLE CARDIOVERTER DEFIBRILLATOR GENERATOR CHANGE;  Surgeon: Elspeth JAYSON Sage, MD;  Location: [facility] CATH LAB;  Service: Cardiovascular;  Laterality: N/A;   LATERAL EPICONDYLE RELEASE  10/11/2012   Procedure: TENNIS ELBOW RELEASE;  Surgeon: Toribio JULIANNA Chancy, MD;  Location: [facility];  Service: Orthopedics;  Laterality: Right;  RIGHT ELBOW: TENOTOMY ELBOW LATERAL EPICONDYLITIS TENNIS ELBOW: RADIAL TUNNEL RELEASE   POLYPECTOMY  01/19/2024   Procedure: POLYPECTOMY;  Surgeon: Cindie Carlin POUR, DO;  Location: [facility];  Service: Endoscopy;;   PORT-A-CATH REMOVAL  04/13/2016   Procedure: MINOR REMOVAL PORT-A-CATH;  Surgeon: Donnice Lima, MD;  Location: [facility];  Service: General;;   PORTACATH PLACEMENT Right 04/05/2016   Procedure: INSERTION PORT-A-CATH WITH US ;  Surgeon: Alm Angle, MD;  Location: [facility] OR;  Service: General;  Laterality: Right;  right IJ   TOTAL THYROIDECTOMY  2001   TUBAL LIGATION  94   UPPER GI ENDOSCOPY     Patient Active Problem List   Diagnosis Date Noted   IBS (irritable colon syndrome) 11/27/2023   H/O adenomatous polyp of colon 11/27/2023   Laryngopharyngeal reflux (LPR) 05/31/2023   Tinnitus of both ears 05/31/2023   Upper airway cough syndrome 11/28/2022   DOE (dyspnea on exertion) 11/28/2022   Paroxysmal atrial fibrillation (HCC) 06/20/2022   Urge incontinence of urine 05/17/2022   Obesity (BMI 30-39.9) 04/13/2022   Neuropathy 05/19/2021   Vestibulitis, vulvar 05/10/2021   Atrophic vaginitis 05/10/2021   Hypertensive disorder 05/10/2021   History of malignant neoplasm of breast 05/10/2021   Osteopenia 04/14/2021   NICM (nonischemic cardiomyopathy) (HCC) 07/23/2020   Post-surgical hypothyroidism 03/27/2020   Anemia 07/15/2019   Lumbar spondylosis with myelopathy 04/19/2019   Genetic testing 11/22/2016   Breast cancer- radiation June 2016 08/05/2016   Cardiac LV ejection fraction 30-35% 07/18/2016   Heart disease 04/22/2016   Breast cancer of upper-inner quadrant of left female breast (HCC) 03/09/2016   Chronic combined systolic and diastolic HF (heart failure) (HCC) 02/26/2014   Ejection fraction < 50%    Itching    Pure hypercholesterolemia    Leg swelling     Dual implantable cardioverter-defibrillator in situ    FOOT PAIN 01/20/2010   History of TIA 2007 01/19/2010    PCP: Jolinda Norene HERO, DO  REFERRING PROVIDER: Jolinda Norene HERO, DO  REFERRING DIAG: R29.898 (ICD-10-CM) - Pelvic girdle weakness  THERAPY DIAG:  Muscle weakness (generalized)  Other low back pain  Rationale for Evaluation and Treatment: Rehabilitation  ONSET DATE: 07/08/24  SUBJECTIVE:   SUBJECTIVE STATEMENT: Pt reports 10/10 right low back pain today.  PERTINENT HISTORY:  Lumbar spondylosis with myelopathy Per MD visit 10/15:Weakness in her lower extremities She reports that she has been having about a 3-week history of difficulty with mobility specifically getting in and out of the car and/or standing up from a seated position.  She does not have any difficulty when she is actually standing and/or walking around.  Denies any sensory changes or overt buckling of the lower extremities or sensation of weakness in the lower extremities when she is able to get into a standing position.  She denies any preceding illness or  injury causing the symptoms.  She has been evaluated for her back before with [facility] and was told that she does have a slipped disc in the low back- she is not quite sure when that last imaging was done.   PAIN:  Are you having pain? Yes: Pain location: back  Pain description: aching Aggravating factors: sitting, standing and walking. Relieving factors: tylenol   Level 7/10 PRECAUTIONS: None  RED FLAGS: None   WEIGHT BEARING RESTRICTIONS: No  FALLS:  Has patient fallen in last 6 months? No  LIVING ENVIRONMENT: Lives with: lives with their family and lives alone Lives in: House/apartment Stairs: No Has following equipment at home: Single point cane does not use all the time.   OCCUPATION: not employed  PLOF: Independent  PATIENT GOALS: less pain, be able to get up easier.   NEXT MD VISIT: none set  OBJECTIVE:  Note:  Objective measures were completed at Evaluation unless otherwise noted.  DIAGNOSTIC FINDINGS: knee FINDINGS: No fracture or dislocation. Preserved joint spaces and bone mineralization. Hyperostosis about the patella.   IMPRESSION: No acute osseous abnormality.     Electronically Signed   By: Ranell Bring M.D.   On: 02/01/2024 11:32    PATIENT SURVEYS:  Modified Oswestry:  MODIFIED OSWESTRY DISABILITY SCALE  Date: 08/27/24 Score  Pain intensity 4 =  Pain medication provides me with little relief from pain.  2. Personal care (washing, dressing, etc.) 2 =  It is painful to take care of myself, and I am slow and careful.  3. Lifting 4 = I can lift only very light weights  4. Walking 2 =  Pain prevents me from walking more than  mile.  5. Sitting 2 =  Pain prevents me from sitting more than 1 hour.  6. Standing 2 =  Pain prevents me from standing more than 1 hour  7. Sleeping 3 =  Even when I take pain medication, I sleep less than 4 hours.  8. Social Life 1 =  My social life is normal, but it increases my level of pain.  9. Traveling 2 =  My pain restricts my travel over 2 hours.  10. Employment/ Homemaking 2 = I can perform most of my homemaking/job duties, but pain prevents me from performing more physically stressful activities (eg, lifting, vacuuming).  Total 24/50  COGNITION: Overall cognitive status: Within functional limits for tasks assessed   Posture:  decreased kyphosis,    LOWER EXTREMITY ROM: WFL  Lumbar extension:  15 degrees with reps causing  Lumbar flexion:  finger tips to knees mainly hip motion ; reps no change   LOWER EXTREMITY MMT:  MMT Right eval Left eval  Hip flexion 3+/5 4+/5  Hip extension 3-/5 3/5  Hip abduction 5-/5 4/5  Hip adduction    Hip internal rotation    Hip external rotation    Knee flexion 4-/5 4-/5  Knee extension 5/5 3/5  Ankle dorsiflexion 4/5 4+/5  Ankle plantarflexion    Ankle inversion    Ankle eversion     (Blank rows =  not tested)    FUNCTIONAL TESTS:  30 seconds chair stand test: 6 x started pushing hands on knees 2 minute walk test: 482 ft  TREATMENT DATE:   09/16/24                                  EXERCISE LOG  Exercise Repetitions and Resistance Comments  Nustep  Lvl 2 x 15 mins   Rockerboard    LAQs    Seated Marches    Seated Hip Abduction    Seated Hip Adduction    Seated Ham Curls    STSs     Blank cell = exercise not performed today   Manual Therapy Soft Tissue Mobilization: Right lumbar musculature, STW/M to right QL and lumbar paraspinals to decrease pain and tone with pt in left side-lying for comfort   08/27/24  Evaluation  Seated Transversus Abdominis Bracing  - 5 reps - 5 hold - Seated Long Arc Quad  10 reps - 5 hold - Single Knee to Chest Stretch  1 reps - 30 hold - Supine Bridge  10 reps - 10 hold  PATIENT EDUCATION:  Education details: HEP, body mechanics for bed mobility  Person educated: Patient Education method: Explanation Education comprehension: verbalized understanding  HOME EXERCISE PROGRAM: Access Code: [email or web address] URL: [email or web address] Date: 08/27/2024 Prepared by: Montie Metro  Exercises - Seated Transversus Abdominis Bracing  - 2 x daily - 7 x weekly - 1 sets - 10 reps - 5 hold - Seated Long Arc Quad  - 2 x daily - 7 x weekly - 1 sets - 10 reps - 5 hold - Single Knee to Chest Stretch  - 2 x daily - 7 x weekly - 1 sets - 3 reps - 30 hold - Supine Bridge  - 2 x daily - 7 x weekly - 1 sets - 10 reps - 10 hold  ASSESSMENT:  CLINICAL IMPRESSION: Pt arrives for today's treatment session denying in knee pain since her injection, but reports 10/10 right low back pain.  Pt states that she woke up Sunday morning with increased back pain and had max difficulty getting out of bed and getting ready for  church.  STW/M performed to right lumbar musculature to decrease pain and tone.  Discussed use of heating pad and hot shower to decrease low back tone.  Pt reported decreased tone at completion of today's treatment session.    OBJECTIVE IMPAIRMENTS: decreased activity tolerance, decreased mobility, difficulty walking, decreased ROM, decreased strength, and pain.   ACTIVITY LIMITATIONS: carrying, lifting, bending, sitting, standing, squatting, and locomotion level  PARTICIPATION LIMITATIONS: cleaning, shopping, and community activity  PERSONAL FACTORS: Time since onset of injury/illness/exacerbation are also affecting patient's functional outcome.   REHAB POTENTIAL: Good  CLINICAL DECISION MAKING: Evolving/moderate complexity  EVALUATION COMPLEXITY: Moderate   GOALS: Goals reviewed with patient? No  SHORT TERM GOALS: Target date: 09/17/24 Pt to be I in HEP to decrease her pain to be no greater than a 5/10  Baseline: Goal status: INITIAL  2.  PT to be able to get out of her car with ease Baseline:  Goal status: INITIAL  3.  Pt to be using good body mechanics for lifting and bed mobility to decrease stress on back.   Baseline:  Goal status: INITIAL  4.  PT able to complete 9 sit to stand to demonstrate improved functional strength.  Baseline:  Goal status: INITIAL   LONG TERM GOALS: Target date: 10/08/24  Pt to be I in an advanced HEP to decrease her pain to be no greater than a 3/10  Baseline:  Goal status: INITIAL  2.  PT to be able to get up from a couch without using her hands  Baseline:  Goal status: INITIAL  3.  PT able to complete 12 sit to stand to demonstrate improved functional strength.  Baseline:  Goal status: INITIAL  4.  PT modified oswestry score to improve by 5 points Baseline:  Goal status: INITIAL   PLAN:  PT FREQUENCY: 2x/week  PT DURATION: 6 weeks  PLANNED INTERVENTIONS: 97110-Therapeutic exercises, 97530- Therapeutic activity, [id number]-  Neuromuscular re-education, 97535- Self Care, [id number]- Manual therapy, and Patient/Family education  PLAN FOR NEXT SESSION: Progress strengthening and flexibility as able.   Delon Gosling, PTA  09/16/2024, 1:42 PM

## 2024-09-17 ENCOUNTER — Ambulatory Visit (HOSPITAL_COMMUNITY): Admitting: Physical Therapy

## 2024-09-17 ENCOUNTER — Encounter: Payer: Self-pay | Admitting: Gastroenterology

## 2024-09-24 ENCOUNTER — Ambulatory Visit (HOSPITAL_COMMUNITY): Admitting: Physical Therapy

## 2024-09-25 ENCOUNTER — Ambulatory Visit: Attending: Family Medicine

## 2024-09-25 DIAGNOSIS — M5459 Other low back pain: Secondary | ICD-10-CM | POA: Insufficient documentation

## 2024-09-25 DIAGNOSIS — M6281 Muscle weakness (generalized): Secondary | ICD-10-CM | POA: Insufficient documentation

## 2024-09-25 NOTE — Therapy (Signed)
 OUTPATIENT PHYSICAL THERAPY LOWER EXTREMITY TREATMENT   Patient Name: [name] [name] MRN: [id number] DOB:10-31-1960, 63 y.o., female Today's Date: 09/25/2024  END OF SESSION:  PT End of Session - 09/25/24 1349     Visit Number 4    Number of Visits 12    Date for Recertification  10/08/24    Authorization Type [employer] medicare/medicaid    PT Start Time 1345    PT Stop Time 1430    PT Time Calculation (min) 45 min    Activity Tolerance Patient tolerated treatment well    Behavior During Therapy [facility] for tasks assessed/performed          Past Medical History:  Diagnosis Date   A-fib (HCC)    Acute deep vein thrombosis (DVT) of right popliteal vein (HCC) 04/13/2022   Acute pulmonary embolism (HCC) 04/12/2022   AICD (automatic cardioverter/defibrillator) present 2009   [employer]- replaced 2016   Allergy    Just seasonal   Anxiety 08/24   Just noticed when i tried to sing at church ladt year. Became really nervous   Arthritis    Bradycardia    Breast cancer (HCC) 02/24/2016   s/p lumpectomy and radiation but refused chemotherapy   Chronic systolic CHF (congestive heart failure) (HCC)    Cough due to angiotensin-converting enzyme inhibitor    Deep venous thrombosis of lower extremity (HCC) 05/24/2023   Dizziness    Dizziness with standing after squatting, March, 2012   DVT (deep venous thrombosis) (HCC)    Dyslipidemia    LDL elevation,Patient does not want statin   Family history of breast cancer    GERD (gastroesophageal reflux disease)    H/O shortness of breath    CPX 10/18:  No evidence of cardiopulmonary limitation. Suspect exercise intolerance related to weight and deconditioning.    Hyperlipidemia 2007   Hypothyroidism    ICD (implantable cardiac defibrillator) battery depletion    Dr. Fernande, June, 2009(artifactual atrial tachycardia response events addressed by reprogramming in parentheses   Leg swelling    right leg   Mitral regurgitation    Myocardial  infarction [facility]) 2007   Nonischemic cardiomyopathy (HCC)    Etiology unknown, diagnosed 2008   Paroxysmal atrial fibrillation (HCC) 06/20/2022   PE (pulmonary thromboembolism) (HCC)    Personal history of radiation therapy 2017   Pneumonia due to COVID-19 virus 10/2019   Pulmonary embolism (HCC) 04/21/2020   Stroke (HCC) 2007   TIA (transient ischemic attack)    No CT or MRI abnormality, aspirin  therapy   TIA (transient ischemic attack) 2007   Past Surgical History:  Procedure Laterality Date   BREAST BIOPSY Left 02/24/2016   U/S Core   BREAST BIOPSY Left 02/24/2016   U/S Core   BREAST LUMPECTOMY Left 04/05/2016   invasive ductal    BREAST LUMPECTOMY WITH RADIOACTIVE SEED AND SENTINEL LYMPH NODE BIOPSY Left 04/05/2016   Procedure: BREAST LUMPECTOMY WITH RADIOACTIVE SEED AND SENTINEL LYMPH NODE BIOPSY;  Surgeon: Alm Angle, MD;  Location: [facility] OR;  Service: General;  Laterality: Left;   CARDIAC CATHETERIZATION N/A 08/05/2016   Procedure: Left Heart Cath and Coronary Angiography;  Surgeon: Peter M Jordan, MD;  Location: [facility] INVASIVE CV LAB;  Service: Cardiovascular;  Laterality: N/A;   CARDIAC DEFIBRILLATOR PLACEMENT  09   [employer] no remote   COLONOSCOPY     COLONOSCOPY N/A 01/19/2024   Procedure: COLONOSCOPY;  Surgeon: Cindie Carlin POUR, DO;  Location: [facility] END[facility]ITE;  Service: Endoscopy;  Laterality: N/A;  11:00 am, asa 3/4   CYST EXCISION Left    back of leg-lt    ESOPHAGOGASTRODUODENOSCOPY N/A 01/19/2024   Procedure: EGD (ESOPHAGOGASTRODUODENOSCOPY);  Surgeon: Cindie Carlin POUR, DO;  Location: [facility];  Service: Endoscopy;  Laterality: N/A;   IMPLANTABLE CARDIOVERTER DEFIBRILLATOR GENERATOR CHANGE N/A 12/29/2014   Procedure: IMPLANTABLE CARDIOVERTER DEFIBRILLATOR GENERATOR CHANGE;  Surgeon: Elspeth JAYSON Sage, MD;  Location: [facility] CATH LAB;  Service: Cardiovascular;  Laterality: N/A;   LATERAL EPICONDYLE RELEASE  10/11/2012   Procedure: TENNIS ELBOW RELEASE;  Surgeon: Toribio JULIANNA Chancy, MD;  Location: [facility];  Service: Orthopedics;  Laterality: Right;  RIGHT ELBOW: TENOTOMY ELBOW LATERAL EPICONDYLITIS TENNIS ELBOW: RADIAL TUNNEL RELEASE   POLYPECTOMY  01/19/2024   Procedure: POLYPECTOMY;  Surgeon: Cindie Carlin POUR, DO;  Location: [facility];  Service: Endoscopy;;   PORT-A-CATH REMOVAL  04/13/2016   Procedure: MINOR REMOVAL PORT-A-CATH;  Surgeon: Donnice Lima, MD;  Location: [facility];  Service: General;;   PORTACATH PLACEMENT Right 04/05/2016   Procedure: INSERTION PORT-A-CATH WITH US ;  Surgeon: Alm Angle, MD;  Location: [facility] OR;  Service: General;  Laterality: Right;  right IJ   TOTAL THYROIDECTOMY  2001   TUBAL LIGATION  94   UPPER GI ENDOSCOPY     Patient Active Problem List   Diagnosis Date Noted   IBS (irritable colon syndrome) 11/27/2023   H/O adenomatous polyp of colon 11/27/2023   Laryngopharyngeal reflux (LPR) 05/31/2023   Tinnitus of both ears 05/31/2023   Upper airway cough syndrome 11/28/2022   DOE (dyspnea on exertion) 11/28/2022   Paroxysmal atrial fibrillation (HCC) 06/20/2022   Urge incontinence of urine 05/17/2022   Obesity (BMI 30-39.9) 04/13/2022   Neuropathy 05/19/2021   Vestibulitis, vulvar 05/10/2021   Atrophic vaginitis 05/10/2021   Hypertensive disorder 05/10/2021   History of malignant neoplasm of breast 05/10/2021   Osteopenia 04/14/2021   NICM (nonischemic cardiomyopathy) (HCC) 07/23/2020   Post-surgical hypothyroidism 03/27/2020   Anemia 07/15/2019   Lumbar spondylosis with myelopathy 04/19/2019   Genetic testing 11/22/2016   Breast cancer- radiation June 2016 08/05/2016   Cardiac LV ejection fraction 30-35% 07/18/2016   Heart disease 04/22/2016   Breast cancer of upper-inner quadrant of left female breast (HCC) 03/09/2016   Chronic combined systolic and diastolic HF (heart failure) (HCC) 02/26/2014   Ejection fraction < 50%    Itching    Pure hypercholesterolemia    Leg swelling     Dual implantable cardioverter-defibrillator in situ    FOOT PAIN 01/20/2010   History of TIA 2007 01/19/2010    PCP: Jolinda Norene HERO, DO  REFERRING PROVIDER: Jolinda Norene HERO, DO  REFERRING DIAG: R29.898 (ICD-10-CM) - Pelvic girdle weakness  THERAPY DIAG:  Muscle weakness (generalized)  Other low back pain  Rationale for Evaluation and Treatment: Rehabilitation  ONSET DATE: 07/08/24  SUBJECTIVE:   SUBJECTIVE STATEMENT: Pt reports 10/10 right low back pain today.  PERTINENT HISTORY:  Lumbar spondylosis with myelopathy Per MD visit 10/15:Weakness in her lower extremities She reports that she has been having about a 3-week history of difficulty with mobility specifically getting in and out of the car and/or standing up from a seated position.  She does not have any difficulty when she is actually standing and/or walking around.  Denies any sensory changes or overt buckling of the lower extremities or sensation of weakness in the lower extremities when she is able to get into a standing position.  She denies any preceding illness or  injury causing the symptoms.  She has been evaluated for her back before with [facility] and was told that she does have a slipped disc in the low back- she is not quite sure when that last imaging was done.   PAIN:  Are you having pain? Yes: Pain location: back  Pain description: aching Aggravating factors: sitting, standing and walking. Relieving factors: tylenol   Level 7/10 PRECAUTIONS: None  RED FLAGS: None   WEIGHT BEARING RESTRICTIONS: No  FALLS:  Has patient fallen in last 6 months? No  LIVING ENVIRONMENT: Lives with: lives with their family and lives alone Lives in: House/apartment Stairs: No Has following equipment at home: Single point cane does not use all the time.   OCCUPATION: not employed  PLOF: Independent  PATIENT GOALS: less pain, be able to get up easier.   NEXT MD VISIT: none set  OBJECTIVE:  Note:  Objective measures were completed at Evaluation unless otherwise noted.  DIAGNOSTIC FINDINGS: knee FINDINGS: No fracture or dislocation. Preserved joint spaces and bone mineralization. Hyperostosis about the patella.   IMPRESSION: No acute osseous abnormality.     Electronically Signed   By: Ranell Bring M.D.   On: 02/01/2024 11:32    PATIENT SURVEYS:  Modified Oswestry:  MODIFIED OSWESTRY DISABILITY SCALE  Date: 08/27/24 Score  Pain intensity 4 =  Pain medication provides me with little relief from pain.  2. Personal care (washing, dressing, etc.) 2 =  It is painful to take care of myself, and I am slow and careful.  3. Lifting 4 = I can lift only very light weights  4. Walking 2 =  Pain prevents me from walking more than  mile.  5. Sitting 2 =  Pain prevents me from sitting more than 1 hour.  6. Standing 2 =  Pain prevents me from standing more than 1 hour  7. Sleeping 3 =  Even when I take pain medication, I sleep less than 4 hours.  8. Social Life 1 =  My social life is normal, but it increases my level of pain.  9. Traveling 2 =  My pain restricts my travel over 2 hours.  10. Employment/ Homemaking 2 = I can perform most of my homemaking/job duties, but pain prevents me from performing more physically stressful activities (eg, lifting, vacuuming).  Total 24/50  COGNITION: Overall cognitive status: Within functional limits for tasks assessed   Posture:  decreased kyphosis,    LOWER EXTREMITY ROM: WFL  Lumbar extension:  15 degrees with reps causing  Lumbar flexion:  finger tips to knees mainly hip motion ; reps no change   LOWER EXTREMITY MMT:  MMT Right eval Left eval  Hip flexion 3+/5 4+/5  Hip extension 3-/5 3/5  Hip abduction 5-/5 4/5  Hip adduction    Hip internal rotation    Hip external rotation    Knee flexion 4-/5 4-/5  Knee extension 5/5 3/5  Ankle dorsiflexion 4/5 4+/5  Ankle plantarflexion    Ankle inversion    Ankle eversion     (Blank rows =  not tested)    FUNCTIONAL TESTS:  30 seconds chair stand test: 6 x started pushing hands on knees 2 minute walk test: 482 ft  TREATMENT DATE:   09/25/24                                  EXERCISE LOG  Exercise Repetitions and Resistance Comments  Nustep  Lvl 2 x 15 mins   Rockerboard 3 mins   LAQs 2# x 25 reps bil   Seated Marches 2# x 25 reps bil   Seated Hip Abduction Red x 3 mins   Seated Hip Adduction 3 mins   Seated Ham Curls Red x 25 reps bil   STSs 10 reps    Blank cell = exercise not performed today   08/27/24  Evaluation  Seated Transversus Abdominis Bracing  - 5 reps - 5 hold - Seated Long Arc Quad  10 reps - 5 hold - Single Knee to Chest Stretch  1 reps - 30 hold - Supine Bridge  10 reps - 10 hold  PATIENT EDUCATION:  Education details: HEP, body mechanics for bed mobility  Person educated: Patient Education method: Explanation Education comprehension: verbalized understanding  HOME EXERCISE PROGRAM: Access Code: [email or web address] URL: [email or web address] Date: 08/27/2024 Prepared by: Montie Metro  Exercises - Seated Transversus Abdominis Bracing  - 2 x daily - 7 x weekly - 1 sets - 10 reps - 5 hold - Seated Long Arc Quad  - 2 x daily - 7 x weekly - 1 sets - 10 reps - 5 hold - Single Knee to Chest Stretch  - 2 x daily - 7 x weekly - 1 sets - 3 reps - 30 hold - Supine Bridge  - 2 x daily - 7 x weekly - 1 sets - 10 reps - 10 hold  ASSESSMENT:  CLINICAL IMPRESSION: Pt arrives for today's treatment session reporting 7/10 left knee pain and 8/10 left low back pain.  Pt reports that swelling has decreased in left knee since getting an injection.  Pt able to tolerate increased reps with all previously performed exercises today.  MH on pt's left low back while performing all seated exercises.  Pt reported decrease back and  knee pain at completion of today's treatment session.  OBJECTIVE IMPAIRMENTS: decreased activity tolerance, decreased mobility, difficulty walking, decreased ROM, decreased strength, and pain.   ACTIVITY LIMITATIONS: carrying, lifting, bending, sitting, standing, squatting, and locomotion level  PARTICIPATION LIMITATIONS: cleaning, shopping, and community activity  PERSONAL FACTORS: Time since onset of injury/illness/exacerbation are also affecting patient's functional outcome.   REHAB POTENTIAL: Good  CLINICAL DECISION MAKING: Evolving/moderate complexity  EVALUATION COMPLEXITY: Moderate   GOALS: Goals reviewed with patient? No  SHORT TERM GOALS: Target date: 09/17/24 Pt to be I in HEP to decrease her pain to be no greater than a 5/10  Baseline: Goal status: INITIAL  2.  PT to be able to get out of her car with ease Baseline:  Goal status: INITIAL  3.  Pt to be using good body mechanics for lifting and bed mobility to decrease stress on back.   Baseline:  Goal status: INITIAL  4.  PT able to complete 9 sit to stand to demonstrate improved functional strength.  Baseline:  Goal status: INITIAL   LONG TERM GOALS: Target date: 10/08/24  Pt to be I in an advanced HEP to decrease her pain to be no greater than a 3/10  Baseline:  Goal status: INITIAL  2.  PT to be able to get up from a couch without using her hands  Baseline:  Goal  status: INITIAL  3.  PT able to complete 12 sit to stand to demonstrate improved functional strength.  Baseline:  Goal status: INITIAL  4.  PT modified oswestry score to improve by 5 points Baseline:  Goal status: INITIAL   PLAN:  PT FREQUENCY: 2x/week  PT DURATION: 6 weeks  PLANNED INTERVENTIONS: 97110-Therapeutic exercises, 97530- Therapeutic activity, [id number]- Neuromuscular re-education, 97535- Self Care, [id number]- Manual therapy, and Patient/Family education  PLAN FOR NEXT SESSION: Progress strengthening and flexibility as able.    Delon Gosling, PTA  09/25/2024, 2:38 PM

## 2024-09-26 ENCOUNTER — Other Ambulatory Visit: Payer: Self-pay | Admitting: Family Medicine

## 2024-09-26 DIAGNOSIS — K589 Irritable bowel syndrome without diarrhea: Secondary | ICD-10-CM

## 2024-09-27 ENCOUNTER — Encounter: Payer: Self-pay | Admitting: Family Medicine

## 2024-09-27 NOTE — Telephone Encounter (Signed)
 The original prescription was discontinued on 11/27/2023 by Wellington Madelin HERO, CMA for the following reason: No longer needed (for PRN medications). Renewing this prescription may not be appropriate.

## 2024-09-30 ENCOUNTER — Encounter: Payer: Self-pay | Admitting: Family Medicine

## 2024-09-30 ENCOUNTER — Other Ambulatory Visit: Payer: Self-pay | Admitting: Family Medicine

## 2024-09-30 DIAGNOSIS — K589 Irritable bowel syndrome without diarrhea: Secondary | ICD-10-CM

## 2024-09-30 MED ORDER — DICYCLOMINE HCL 10 MG PO CAPS: 10.0000 mg | ORAL_CAPSULE | Freq: Four times a day (QID) | ORAL | 0 refills | Status: AC | PRN

## 2024-10-01 ENCOUNTER — Ambulatory Visit (HOSPITAL_COMMUNITY): Admitting: Physical Therapy

## 2024-10-02 ENCOUNTER — Ambulatory Visit

## 2024-10-02 ENCOUNTER — Ambulatory Visit (HOSPITAL_BASED_OUTPATIENT_CLINIC_OR_DEPARTMENT_OTHER): Admitting: Orthopaedic Surgery

## 2024-10-04 ENCOUNTER — Ambulatory Visit: Admitting: Student in an Organized Health Care Education/Training Program

## 2024-10-11 ENCOUNTER — Other Ambulatory Visit: Payer: Self-pay

## 2024-10-11 ENCOUNTER — Encounter: Payer: Self-pay | Admitting: Emergency Medicine

## 2024-10-11 ENCOUNTER — Ambulatory Visit
Admission: EM | Admit: 2024-10-11 | Discharge: 2024-10-11 | Disposition: A | Discharge status: Home / Self Care | Attending: Family Medicine | Admitting: Family Medicine

## 2024-10-11 DIAGNOSIS — J101 Influenza due to other identified influenza virus with other respiratory manifestations: Secondary | ICD-10-CM | POA: Diagnosis not present

## 2024-10-11 DIAGNOSIS — R42 Dizziness and giddiness: Secondary | ICD-10-CM

## 2024-10-11 DIAGNOSIS — J069 Acute upper respiratory infection, unspecified: Secondary | ICD-10-CM | POA: Diagnosis not present

## 2024-10-11 DIAGNOSIS — R509 Fever, unspecified: Secondary | ICD-10-CM | POA: Diagnosis not present

## 2024-10-11 LAB — POC COVID19/FLU A&B COMBO
Covid Antigen, POC: NEGATIVE
Influenza A Antigen, POC: POSITIVE — AB
Influenza B Antigen, POC: NEGATIVE

## 2024-10-11 LAB — GLUCOSE, POCT (MANUAL RESULT ENTRY): POC Glucose: 115 mg/dL — AB (ref 70–99)

## 2024-10-11 MED ORDER — ALBUTEROL SULFATE (2.5 MG/3ML) 0.083% IN NEBU
2.5000 mg | INHALATION_SOLUTION | Freq: Once | RESPIRATORY_TRACT | Status: AC
  Administered 2024-10-11: 2.5 mg via RESPIRATORY_TRACT

## 2024-10-11 MED ORDER — PROMETHAZINE-DM 6.25-15 MG/5ML PO SYRP: 5.0000 mL | ORAL_SOLUTION | Freq: Four times a day (QID) | ORAL | 0 refills | Status: AC | PRN

## 2024-10-11 MED ORDER — ACETAMINOPHEN 500 MG PO TABS
1000.0000 mg | ORAL_TABLET | Freq: Once | ORAL | Status: AC
  Administered 2024-10-11: 1000 mg via ORAL

## 2024-10-11 MED ORDER — OSELTAMIVIR PHOSPHATE 75 MG PO CAPS: 75.0000 mg | ORAL_CAPSULE | Freq: Two times a day (BID) | ORAL | 0 refills | Status: AC

## 2024-10-11 MED ORDER — AZELASTINE HCL 0.1 % NA SOLN: 1.0000 | Freq: Two times a day (BID) | NASAL | 0 refills | Status: AC

## 2024-10-11 NOTE — Discharge Instructions (Addendum)
 Use your albuterol  inhaler every 4 hours as needed, and I have prescribed the antiviral medication for the flu, cough syrup and nasal spray.  Follow-up in the emergency department for worsening symptoms at any time

## 2024-10-11 NOTE — ED Notes (Signed)
 Called pt for triage. Pt in waiting room bathroom.

## 2024-10-11 NOTE — ED Notes (Addendum)
 During EKG, pt reports  I feel like I'm going to pass out. Inquired what pt needed to go to bathroom for, pt reported BM. Denies diarrhea. Pt laid back. CBG obtained, 115. EKG completed and provider aware of pt statement and reviewed print out.

## 2024-10-11 NOTE — ED Notes (Signed)
 Completed triage, pt HR noted to be 38 and bp 92/52. Obtained portable pulse ox to compare vitals signs, HR remains 38. Notified provider, EKG machine wheeled into room. Provider at bedside. Pt HR noted to be 85, 02 noted to be ranging from 88-95%.

## 2024-10-11 NOTE — ED Triage Notes (Addendum)
 Pt reports cough, fatigue, body aches, fever, intermittent dizziness for last several days. Denies anything otc prior to UC.   Pt did drive self here.

## 2024-10-13 NOTE — ED Provider Notes (Signed)
 " [facility]    CSN: [id number] Arrival date & time: 10/11/24  0803      History   Chief Complaint Chief Complaint  Patient presents with   Cough    HPI [name] [name] is a 63 y.o. female.   Patient presenting today with 2-day history of cough, fatigue, body aches, fever, intermittent episodes of dizziness.  Denies chest pain, shortness of breath, abdominal pain, vomiting, diarrhea.  So far not trying anything over-the-counter for symptoms.  Past medical history significant for atrial fibrillation, AICD present, seasonal allergies, hypertension, heart failure, upper airway cough syndrome on albuterol  as needed.  Currently on Eliquis  for anticoagulation.    Past Medical History:  Diagnosis Date   A-fib [facility])    Acute deep vein thrombosis (DVT) of right popliteal vein (HCC) 04/13/2022   Acute pulmonary embolism (HCC) 04/12/2022   AICD (automatic cardioverter/defibrillator) present 2009   [employer]- replaced 2016   Allergy    Just seasonal   Anxiety 08/24   Just noticed when i tried to sing at church ladt year. Became really nervous   Arthritis    Bradycardia    Breast cancer (HCC) 02/24/2016   s/p lumpectomy and radiation but refused chemotherapy   Chronic systolic CHF (congestive heart failure) (HCC)    Cough due to angiotensin-converting enzyme inhibitor    Deep venous thrombosis of lower extremity (HCC) 05/24/2023   Dizziness    Dizziness with standing after squatting, March, 2012   DVT (deep venous thrombosis) (HCC)    Dyslipidemia    LDL elevation,Patient does not want statin   Family history of breast cancer    GERD (gastroesophageal reflux disease)    H/O shortness of breath    CPX 10/18:  No evidence of cardiopulmonary limitation. Suspect exercise intolerance related to weight and deconditioning.    Hyperlipidemia 2007   Hypothyroidism    ICD (implantable cardiac defibrillator) battery depletion    Dr. Fernande, June, 2009(artifactual atrial  tachycardia response events addressed by reprogramming in parentheses   Leg swelling    right leg   Mitral regurgitation    Myocardial infarction [facility]) 2007   Nonischemic cardiomyopathy (HCC)    Etiology unknown, diagnosed 2008   Paroxysmal atrial fibrillation (HCC) 06/20/2022   PE (pulmonary thromboembolism) (HCC)    Personal history of radiation therapy 2017   Pneumonia due to COVID-19 virus 10/2019   Pulmonary embolism (HCC) 04/21/2020   Stroke (HCC) 2007   TIA (transient ischemic attack)    No CT or MRI abnormality, aspirin  therapy   TIA (transient ischemic attack) 2007    Patient Active Problem List   Diagnosis Date Noted   IBS (irritable colon syndrome) 11/27/2023   H/O adenomatous polyp of colon 11/27/2023   Laryngopharyngeal reflux (LPR) 05/31/2023   Tinnitus of both ears 05/31/2023   Upper airway cough syndrome 11/28/2022   DOE (dyspnea on exertion) 11/28/2022   Paroxysmal atrial fibrillation (HCC) 06/20/2022   Urge incontinence of urine 05/17/2022   Obesity (BMI 30-39.9) 04/13/2022   Neuropathy 05/19/2021   Vestibulitis, vulvar 05/10/2021   Atrophic vaginitis 05/10/2021   Hypertensive disorder 05/10/2021   History of malignant neoplasm of breast 05/10/2021   Osteopenia 04/14/2021   NICM (nonischemic cardiomyopathy) (HCC) 07/23/2020   Post-surgical hypothyroidism 03/27/2020   Anemia 07/15/2019   Lumbar spondylosis with myelopathy 04/19/2019   Genetic testing 11/22/2016   Breast cancer- radiation June 2016 08/05/2016   Cardiac LV ejection fraction 30-35% 07/18/2016   Heart disease 04/22/2016  Breast cancer of upper-inner quadrant of left female breast (HCC) 03/09/2016   Chronic combined systolic and diastolic HF (heart failure) (HCC) 02/26/2014   Ejection fraction < 50%    Itching    Pure hypercholesterolemia    Leg swelling    Dual implantable cardioverter-defibrillator in situ    FOOT PAIN 01/20/2010   History of TIA 2007 01/19/2010    Past Surgical  History:  Procedure Laterality Date   BREAST BIOPSY Left 02/24/2016   U/S Core   BREAST BIOPSY Left 02/24/2016   U/S Core   BREAST LUMPECTOMY Left 04/05/2016   invasive ductal    BREAST LUMPECTOMY WITH RADIOACTIVE SEED AND SENTINEL LYMPH NODE BIOPSY Left 04/05/2016   Procedure: BREAST LUMPECTOMY WITH RADIOACTIVE SEED AND SENTINEL LYMPH NODE BIOPSY;  Surgeon: Alm Angle, MD;  Location: [facility] OR;  Service: General;  Laterality: Left;   CARDIAC CATHETERIZATION N/A 08/05/2016   Procedure: Left Heart Cath and Coronary Angiography;  Surgeon: Peter M Jordan, MD;  Location: [facility] INVASIVE CV LAB;  Service: Cardiovascular;  Laterality: N/A;   CARDIAC DEFIBRILLATOR PLACEMENT  09   [employer] no remote   COLONOSCOPY     COLONOSCOPY N/A 01/19/2024   Procedure: COLONOSCOPY;  Surgeon: Cindie Carlin POUR, DO;  Location: [facility];  Service: Endoscopy;  Laterality: N/A;  11:00 am, asa 3/4   CYST EXCISION Left    back of leg-lt    ESOPHAGOGASTRODUODENOSCOPY N/A 01/19/2024   Procedure: EGD (ESOPHAGOGASTRODUODENOSCOPY);  Surgeon: Cindie Carlin POUR, DO;  Location: [facility];  Service: Endoscopy;  Laterality: N/A;   IMPLANTABLE CARDIOVERTER DEFIBRILLATOR GENERATOR CHANGE N/A 12/29/2014   Procedure: IMPLANTABLE CARDIOVERTER DEFIBRILLATOR GENERATOR CHANGE;  Surgeon: Elspeth JAYSON Sage, MD;  Location: [facility] CATH LAB;  Service: Cardiovascular;  Laterality: N/A;   LATERAL EPICONDYLE RELEASE  10/11/2012   Procedure: TENNIS ELBOW RELEASE;  Surgeon: Toribio JULIANNA Chancy, MD;  Location: [facility];  Service: Orthopedics;  Laterality: Right;  RIGHT ELBOW: TENOTOMY ELBOW LATERAL EPICONDYLITIS TENNIS ELBOW: RADIAL TUNNEL RELEASE   POLYPECTOMY  01/19/2024   Procedure: POLYPECTOMY;  Surgeon: Cindie Carlin POUR, DO;  Location: [facility];  Service: Endoscopy;;   PORT-A-CATH REMOVAL  04/13/2016   Procedure: MINOR REMOVAL PORT-A-CATH;  Surgeon: Donnice Lima, MD;  Location: [facility];  Service:  General;;   PORTACATH PLACEMENT Right 04/05/2016   Procedure: INSERTION PORT-A-CATH WITH US ;  Surgeon: Alm Angle, MD;  Location: [facility] OR;  Service: General;  Laterality: Right;  right IJ   TOTAL THYROIDECTOMY  2001   TUBAL LIGATION  94   UPPER GI ENDOSCOPY      OB History     Gravida  2   Para      Term      Preterm      AB      Living  2      SAB      IAB      Ectopic      Multiple      Live Births               Home Medications    Prior to Admission medications  Medication Sig Start Date End Date Taking? Authorizing Provider  azelastine  (ASTELIN ) 0.1 % nasal spray Place 1 spray into both nostrils 2 (two) times daily. Use in each nostril as directed 10/11/24  Yes Stuart Vernell Norris, PA-C  oseltamivir  (TAMIFLU ) 75 MG capsule Take 1 capsule (75 mg total) by mouth every 12 (twelve) hours. 10/11/24  Yes Stuart Vernell Norris, PA-C  promethazine -dextromethorphan (PROMETHAZINE -DM) 6.25-15 MG/5ML syrup Take 5 mLs by mouth 4 (four) times daily as needed. 10/11/24  Yes Stuart Vernell Norris, PA-C  albuterol  (VENTOLIN  HFA) [phone] Base) MCG/ACT inhaler Inhale 2 puffs into the lungs every 6 (six) hours as needed for wheezing or shortness of breath (NOV for further fills). 09/16/24   Jolinda Norene HERO, DO  apixaban  (ELIQUIS ) 5 MG TABS tablet Take 1 tablet by mouth twice daily 05/21/24   Okey Vina GAILS, MD  carvedilol  (COREG ) 3.125 MG tablet Take 1 tablet (3.125 mg total) by mouth 2 (two) times daily. NEEDS FOLLOW UP APPOINTMENT FOR ANYMORE REFILLS 04/29/24   Okey Vina GAILS, MD  dicyclomine  (BENTYL ) 10 MG capsule Take 1 capsule (10 mg total) by mouth 4 (four) times daily as needed for spasms. 09/30/24   Jolinda Norene HERO, DO  eplerenone  (INSPRA ) 25 MG tablet Take 1 tablet by mouth once daily 09/13/24   Okey Vina GAILS, MD  fluticasone  (FLONASE ) 50 MCG/ACT nasal spray Place 2 sprays into both nostrils daily. 11/01/23   Severa Rock HERO, FNP  levothyroxine  (SYNTHROID ) 75 MCG  tablet Take 1 tablet (75 mcg total) by mouth daily before breakfast. 08/07/24   Jolinda Norene M, DO  linaclotide  (LINZESS ) 290 MCG CAPS capsule Take 1 capsule (290 mcg total) by mouth daily before breakfast. 04/02/24   Ezzard Sonny RAMAN, PA-C  loratadine (CLARITIN) 10 MG tablet Take 10 mg by mouth as needed. 02/08/24   [provider]  rosuvastatin  (CRESTOR ) 20 MG tablet Take 1 tablet (20 mg total) by mouth daily. 08/09/24   Okey Vina GAILS, MD  Suvorexant  (BELSOMRA ) 5 MG TABS Take 1 tablet (5 mg total) by mouth at bedtime as needed (sleep). 08/07/24   Jolinda Norene HERO, DO  traMADol (ULTRAM) 50 MG tablet Take 50 mg by mouth every 6 (six) hours as needed. 05/06/24   [provider]  valsartan  (DIOVAN ) 80 MG tablet Take 1 tablet (80 mg total) by mouth daily. 09/02/24   Okey Vina GAILS, MD    Family History Family History  Problem Relation Age of Onset   Heart attack Mother    Cancer Mother        breast   Breast cancer Mother 11   Arthritis Mother    Heart disease Mother    Hypertension Sister    Arthritis Sister    Hypertension Brother    Arthritis Brother    Cancer Paternal Grandmother        possible cancer, unknown type   Cancer Maternal Aunt        breast   Breast cancer Maternal Aunt    Diabetes Brother    Miscarriages / Stillbirths Daughter    Obesity Daughter    Obesity Son    Stroke Neg Hx    Colon cancer Neg Hx     Social History Social History[1]   Allergies   Avelox [moxifloxacin hcl in nacl], Clindamycin/lincomycin, Kiwi extract, Bactrim [sulfamethoxazole-trimethoprim], Levofloxacin, Simvastatin, Spironolactone , Moxifloxacin hcl, and Tramadol   Review of Systems Review of Systems Per HPI  Physical Exam Triage Vital Signs ED Triage Vitals  Encounter Vitals Group     BP 10/11/24 0838 (!) 92/52     Girls Systolic BP Percentile --      Girls Diastolic BP Percentile --      Boys Systolic BP Percentile --      Boys Diastolic BP Percentile --       Pulse Rate 10/11/24  9161 (!) 38     Resp 10/11/24 0831 (P) 20     Temp 10/11/24 0954 (!) 102.9 F (39.4 C)     Temp Source 10/11/24 0831 (P) Oral     SpO2 10/11/24 0838 96 %     Weight --      Height --      Head Circumference --      Peak Flow --      Pain Score 10/11/24 0830 9     Pain Loc --      Pain Education --      Exclude from Growth Chart --    No data found.  Updated Vital Signs BP 109/71 (BP Location: Right Arm)   Pulse 95   Temp (!) 102.9 F (39.4 C) (Oral)   Resp 20   SpO2 91%   Visual Acuity Right Eye Distance:   Left Eye Distance:   Bilateral Distance:    Right Eye Near:   Left Eye Near:    Bilateral Near:     Physical Exam Vitals and nursing note reviewed.  Constitutional:      Appearance: Normal appearance. She is ill-appearing.  HENT:     Head: Atraumatic.     Nose: Rhinorrhea present.     Mouth/Throat:     Mouth: Mucous membranes are moist.     Pharynx: Oropharynx is clear.  Eyes:     Extraocular Movements: Extraocular movements intact.     Conjunctiva/sclera: Conjunctivae normal.  Cardiovascular:     Rate and Rhythm: Normal rate and regular rhythm.     Heart sounds: Normal heart sounds.  Pulmonary:     Effort: Pulmonary effort is normal.     Breath sounds: Normal breath sounds. No wheezing or rales.  Musculoskeletal:        General: Normal range of motion.     Cervical back: Normal range of motion and neck supple.  Skin:    General: Skin is warm and dry.  Neurological:     Mental Status: She is alert and oriented to person, place, and time.     Motor: No weakness.     Gait: Gait normal.  Psychiatric:        Mood and Affect: Mood normal.        Thought Content: Thought content normal.        Judgment: Judgment normal.      UC Treatments / Results  Labs (all labs ordered are listed, but only abnormal results are displayed) Labs Reviewed  GLUCOSE, POCT (MANUAL RESULT ENTRY) - Abnormal; Notable for the following  components:      Result Value   POC Glucose 115 (*)    All other components within normal limits  POC COVID19/FLU A&B COMBO - Abnormal; Notable for the following components:   Influenza A Antigen, POC Positive (*)    All other components within normal limits    EKG   Radiology No results found.  Procedures Procedures (including critical care time)  Medications Ordered in UC Medications  albuterol  (PROVENTIL ) (2.5 MG/3ML) 0.083% nebulizer solution 2.5 mg (2.5 mg Nebulization Given 10/11/24 0927)  acetaminophen  (TYLENOL ) tablet 1,000 mg (1,000 mg Oral Given 10/11/24 1003)    Initial Impression / Assessment and Plan / UC Course  I have reviewed the triage vital signs and the nursing notes.  Pertinent labs & imaging results that were available during my care of the patient were reviewed by me and considered in my medical decision making (see chart  for details).     Initially in triage was found to have a heart rate of about 38 bpm but upon recheck and throughout duration of remaining time in department heart rates were between 80s to 90s range consistently.  EKG today showing normal sinus rhythm at 93 bpm with no acute ST elevations.  She was then later found to be borderline hypoxic ranging between 88% to 92% on room air.  She denies any symptoms of shortness of breath or difficulty breathing.  After albuterol  nebulizer treatment, her oxygen  saturation remained between 91% to 94% on room air.  Her temperature was significantly elevated at 102.9 F so Tylenol  was given for this.  Rapid flu positive for influenza A.  Point-of-care glucose 115.  Do suspect all symptoms to be secondary to influenza.  Treat with Tamiflu , Phenergan  DM, Astelin , continued albuterol  as needed which she states she has an and date inhaler at home and discussed strict ED precautions for worsening or unresolving symptoms.   Final Clinical Impressions(s) / UC Diagnoses   Final diagnoses:  Dizziness  Viral URI   Influenza A  Fever, unspecified     Discharge Instructions      Use your albuterol  inhaler every 4 hours as needed, and I have prescribed the antiviral medication for the flu, cough syrup and nasal spray.  Follow-up in the emergency department for worsening symptoms at any time    ED Prescriptions     Medication Sig Dispense Auth. Provider   oseltamivir  (TAMIFLU ) 75 MG capsule Take 1 capsule (75 mg total) by mouth every 12 (twelve) hours. 10 capsule Stuart Vernell Norris, PA-C   promethazine -dextromethorphan (PROMETHAZINE -DM) 6.25-15 MG/5ML syrup Take 5 mLs by mouth 4 (four) times daily as needed. 100 mL Stuart Vernell Norris, PA-C   azelastine  (ASTELIN ) 0.1 % nasal spray Place 1 spray into both nostrils 2 (two) times daily. Use in each nostril as directed 30 mL Stuart Vernell Norris, PA-C      PDMP not reviewed this encounter.    [1]  Social History Tobacco Use   Smoking status: Never   Smokeless tobacco: Never  Vaping Use   Vaping status: Never Used  Substance Use Topics   Alcohol use: No   Drug use: No     Stuart Vernell Norris, PA-C 10/13/24 1445  "

## 2024-10-21 ENCOUNTER — Encounter: Payer: Self-pay | Admitting: Family Medicine

## 2024-10-29 ENCOUNTER — Encounter: Payer: Self-pay | Admitting: Family Medicine

## 2024-10-29 ENCOUNTER — Ambulatory Visit (INDEPENDENT_AMBULATORY_CARE_PROVIDER_SITE_OTHER): Admitting: Family Medicine

## 2024-10-29 VITALS — BP 113/70 | HR 81 | Temp 97.7°F | Ht 67.0 in | Wt 220.4 lb

## 2024-10-29 DIAGNOSIS — G9331 Postviral fatigue syndrome: Secondary | ICD-10-CM | POA: Diagnosis not present

## 2024-10-29 DIAGNOSIS — R739 Hyperglycemia, unspecified: Secondary | ICD-10-CM | POA: Diagnosis not present

## 2024-10-29 DIAGNOSIS — E890 Postprocedural hypothyroidism: Secondary | ICD-10-CM | POA: Diagnosis not present

## 2024-10-29 LAB — BAYER DCA HB A1C WAIVED: HB A1C (BAYER DCA - WAIVED): 5.9 % — ABNORMAL HIGH (ref 4.8–5.6)

## 2024-10-29 MED ORDER — ONDANSETRON 4 MG PO TBDP: 4.0000 mg | ORAL_TABLET | Freq: Three times a day (TID) | ORAL | 0 refills | Status: AC | PRN

## 2024-10-29 NOTE — Progress Notes (Signed)
 "  Subjective: CC: Recent influenza PCP: [name] [name] HERO, DO YEP:[name] [name] [name] [name] is a 64 y.o. female presenting to clinic today for:  Patient was diagnosed and treated for influenza 12/19.  She presents today because she is continuing to have cough, fatigue and nausea.  She did have an episode of emesis but this seems to be precipitated by brushing her teeth.  She does not report any ongoing fevers.  No hemoptysis.   ROS: Per HPI  Allergies[1] Past Medical History:  Diagnosis Date   A-fib (HCC)    Acute deep vein thrombosis (DVT) of right popliteal vein (HCC) 04/13/2022   Acute pulmonary embolism (HCC) 04/12/2022   AICD (automatic cardioverter/defibrillator) present 2009   [employer]oston scientific- replaced 2016   Allergy    Just seasonal   Anxiety 08/24   Just noticed when i tried to sing at church ladt year. Became really nervous   Arthritis    Bradycardia    Breast cancer (HCC) 02/24/2016   s/p lumpectomy and radiation but refused chemotherapy   Chronic systolic CHF (congestive heart failure) (HCC)    Cough due to angiotensin-converting enzyme inhibitor    Deep venous thrombosis of lower extremity (HCC) 05/24/2023   Dizziness    Dizziness with standing after squatting, March, 2012   DVT (deep venous thrombosis) (HCC)    Dyslipidemia    LDL elevation,Patient does not want statin   Family history of breast cancer    GERD (gastroesophageal reflux disease)    H/O shortness of breath    CPX 10/18:  No evidence of cardiopulmonary limitation. Suspect exercise intolerance related to weight and deconditioning.    Hyperlipidemia 2007   Hypothyroidism    ICD (implantable cardiac defibrillator) battery depletion    Dr. Fernande, June, 2009(artifactual atrial tachycardia response events addressed by reprogramming in parentheses   Leg swelling    right leg   Mitral regurgitation    Myocardial infarction [facility]) 2007   Nonischemic cardiomyopathy (HCC)    Etiology unknown, diagnosed 2008    Paroxysmal atrial fibrillation (HCC) 06/20/2022   PE (pulmonary thromboembolism) (HCC)    Personal history of radiation therapy 2017   Pneumonia due to COVID-19 virus 10/2019   Pulmonary embolism (HCC) 04/21/2020   Stroke (HCC) 2007   TIA (transient ischemic attack)    No CT or MRI abnormality, aspirin  therapy   TIA (transient ischemic attack) 2007   Current Medications[2] Social History   Socioeconomic History   Marital status: Widowed    Spouse name: Not on file   Number of children: Not on file   Years of education: Not on file   Highest education level: Some college, no degree  Occupational History   Occupation: Widow  Tobacco Use   Smoking status: Never   Smokeless tobacco: Never  Vaping Use   Vaping status: Never Used  Substance and Sexual Activity   Alcohol use: No   Drug use: No   Sexual activity: Not Currently    Birth control/protection: Surgical    Comment: tubal  Other Topics Concern   Not on file  Social History Narrative   Not on file   Social Drivers of Health   Tobacco Use: Low Risk (09/30/2024)   Patient History    Smoking Tobacco Use: Never    Smokeless Tobacco Use: Never    Passive Exposure: Not on file  Financial Resource Strain: High Risk (08/22/2024)   Overall Financial Resource Strain (CARDIA)    Difficulty of Paying Living Expenses: Hard  Food Insecurity: Food Insecurity Present (08/22/2024)   Epic    Worried About Programme Researcher, Broadcasting/film/video in the Last Year: Sometimes true    [employer] of Food in the Last Year: Sometimes true  Transportation Needs: No Transportation Needs (08/22/2024)   Epic    Lack of Transportation (Medical): No    Lack of Transportation (Non-Medical): No  Physical Activity: Insufficiently Active (08/22/2024)   Exercise Vital Sign    Days of Exercise per Week: 2 days    Minutes of Exercise per Session: 20 min  Stress: No Stress Concern Present (08/22/2024)   [employer] of Occupational Health - Occupational Stress  Questionnaire    Feeling of Stress: Not at all  Social Connections: Moderately Integrated (08/22/2024)   Social Connection and Isolation Panel    Frequency of Communication with Friends and Family: More than three times a week    Frequency of Social Gatherings with Friends and Family: Three times a week    Attends Religious Services: More than 4 times per year    Active Member of Clubs or Organizations: Yes    Attends Banker Meetings: 1 to 4 times per year    Marital Status: Widowed  Intimate Partner Violence: Not At Risk (08/22/2024)   Epic    Fear of Current or Ex-Partner: No    Emotionally Abused: No    Physically Abused: No    Sexually Abused: No  Depression (PHQ2-9): Medium Risk (08/22/2024)   Depression (PHQ2-9)    PHQ-2 Score: 6  Alcohol Screen: Low Risk (08/22/2024)   Alcohol Screen    Last Alcohol Screening Score (AUDIT): 0  Housing: Low Risk (08/22/2024)   Epic    Unable to Pay for Housing in the Last Year: No    Number of Times Moved in the Last Year: 0    Homeless in the Last Year: No  Utilities: Not At Risk (08/22/2024)   Epic    Threatened with loss of utilities: No  Health Literacy: Adequate Health Literacy (08/22/2024)   B1300 Health Literacy    Frequency of need for help with medical instructions: Never   Family History  Problem Relation Age of Onset   Heart attack Mother    Cancer Mother        breast   Breast cancer Mother 37   Arthritis Mother    Heart disease Mother    Hypertension Sister    Arthritis Sister    Hypertension Brother    Arthritis Brother    Cancer Paternal Grandmother        possible cancer, unknown type   Cancer Maternal Aunt        breast   Breast cancer Maternal Aunt    Diabetes Brother    Miscarriages / Stillbirths Daughter    Obesity Daughter    Obesity Son    Stroke Neg Hx    Colon cancer Neg Hx     Objective: Office vital signs reviewed. BP 113/70   Pulse 81   Temp 97.7 F (36.5 C)   Ht 5' 7  (1.702 m)   Wt 220 lb 6 oz (100 kg)   SpO2 97%   BMI 34.52 kg/m   Physical Examination:  General: Awake, alert, well nourished, tired appearing female, No acute distress HEENT: sclera white, MMM Cardio: regular rate and rhythm, S1S2 heard, no murmurs appreciated Pulm: clear to auscultation bilaterally, no wheezes, rhonchi or rales; normal work of breathing on room air  Assessment/ Plan: 64 y.o.  female   Post viral syndrome - Plan: CMP14+EGFR, CBC with Differential, ondansetron  (ZOFRAN -ODT) 4 MG disintegrating tablet  Elevated serum glucose - Plan: CMP14+EGFR, CBC with Differential, Bayer DCA Hb A1c Waived  Post-surgical hypothyroidism - Plan: TSH + free T4   Suspect postviral syndrome.  No evidence of secondary bacterial infection.  Will check labs.  Specifically check thyroid  levels given impact of viral illnesses can sometimes have on thyroid  levels.  Check A1c given reports of elevated serum glucose at the urgent care.  Further interventions pending results   [name] CHRISTELLA Fielding, DO [facility] [phone]     [1]  Allergies Allergen Reactions   Avelox [Moxifloxacin Hcl In Nacl] Shortness Of Breath    Shortness of breath   Clindamycin/Lincomycin Shortness Of Breath and Diarrhea    Breathing/diarrhea     Kiwi Extract Anaphylaxis    Makes pt. Feel like her throat is closing    Bactrim [Sulfamethoxazole-Trimethoprim] Swelling   Levofloxacin Other (See Comments)    JOINT PAIN JOINT PAIN   Simvastatin Other (See Comments)    Joint pain   Spironolactone  Other (See Comments)    burning   Moxifloxacin Hcl Other (See Comments)   Tramadol Nausea Only  [2]  Current Outpatient Medications:    albuterol  (VENTOLIN  HFA) 108 (90 Base) MCG/ACT inhaler, Inhale 2 puffs into the lungs every 6 (six) hours as needed for wheezing or shortness of breath (NOV for further fills)., Disp: 8 g, Rfl: 0   apixaban  (ELIQUIS ) 5 MG TABS tablet, Take 1 tablet by mouth  twice daily, Disp: 180 tablet, Rfl: 1   azelastine  (ASTELIN ) 0.1 % nasal spray, Place 1 spray into both nostrils 2 (two) times daily. Use in each nostril as directed, Disp: 30 mL, Rfl: 0   carvedilol  (COREG ) 3.125 MG tablet, Take 1 tablet (3.125 mg total) by mouth 2 (two) times daily. NEEDS FOLLOW UP APPOINTMENT FOR ANYMORE REFILLS, Disp: 180 tablet, Rfl: 2   dicyclomine  (BENTYL ) 10 MG capsule, Take 1 capsule (10 mg total) by mouth 4 (four) times daily as needed for spasms., Disp: 120 capsule, Rfl: 0   eplerenone  (INSPRA ) 25 MG tablet, Take 1 tablet by mouth once daily, Disp: 90 tablet, Rfl: 2   fluticasone  (FLONASE ) 50 MCG/ACT nasal spray, Place 2 sprays into both nostrils daily., Disp: 16 g, Rfl: 6   levothyroxine  (SYNTHROID ) 75 MCG tablet, Take 1 tablet (75 mcg total) by mouth daily before breakfast., Disp: 90 tablet, Rfl: 4   linaclotide  (LINZESS ) 290 MCG CAPS capsule, Take 1 capsule (290 mcg total) by mouth daily before breakfast., Disp: 30 capsule, Rfl: 5   loratadine (CLARITIN) 10 MG tablet, Take 10 mg by mouth as needed., Disp: , Rfl:    oseltamivir  (TAMIFLU ) 75 MG capsule, Take 1 capsule (75 mg total) by mouth every 12 (twelve) hours., Disp: 10 capsule, Rfl: 0   promethazine -dextromethorphan (PROMETHAZINE -DM) 6.25-15 MG/5ML syrup, Take 5 mLs by mouth 4 (four) times daily as needed., Disp: 100 mL, Rfl: 0   rosuvastatin  (CRESTOR ) 20 MG tablet, Take 1 tablet (20 mg total) by mouth daily., Disp: 90 tablet, Rfl: 3   Suvorexant  (BELSOMRA ) 5 MG TABS, Take 1 tablet (5 mg total) by mouth at bedtime as needed (sleep)., Disp: 30 tablet, Rfl: 0   traMADol (ULTRAM) 50 MG tablet, Take 50 mg by mouth every 6 (six) hours as needed., Disp: , Rfl:    valsartan  (DIOVAN ) 80 MG tablet, Take 1 tablet (80 mg total) by mouth daily., Disp:  90 tablet, Rfl: 1  "

## 2024-10-30 ENCOUNTER — Ambulatory Visit: Payer: Self-pay | Admitting: Family Medicine

## 2024-10-30 LAB — CBC WITH DIFFERENTIAL/PLATELET
Basophils Absolute: 0 x10E3/uL (ref 0.0–0.2)
Basos: 1 %
EOS (ABSOLUTE): 0.3 x10E3/uL (ref 0.0–0.4)
Eos: 5 %
Hematocrit: 37.4 % (ref 34.0–46.6)
Hemoglobin: 11.9 g/dL (ref 11.1–15.9)
Immature Grans (Abs): 0 x10E3/uL (ref 0.0–0.1)
Immature Granulocytes: 0 %
Lymphocytes Absolute: 1.9 x10E3/uL (ref 0.7–3.1)
Lymphs: 32 %
MCH: 29.6 pg (ref 26.6–33.0)
MCHC: 31.8 g/dL (ref 31.5–35.7)
MCV: 93 fL (ref 79–97)
Monocytes Absolute: 0.6 x10E3/uL (ref 0.1–0.9)
Monocytes: 10 %
Neutrophils Absolute: 3 x10E3/uL (ref 1.4–7.0)
Neutrophils: 52 %
Platelets: 230 x10E3/uL (ref 150–450)
RBC: 4.02 x10E6/uL (ref 3.77–5.28)
RDW: 12.5 % (ref 11.7–15.4)
WBC: 5.7 x10E3/uL (ref 3.4–10.8)

## 2024-10-30 LAB — CMP14+EGFR
ALT: 9 IU/L (ref 0–32)
AST: 15 IU/L (ref 0–40)
Albumin: 4.1 g/dL (ref 3.9–4.9)
Alkaline Phosphatase: 58 IU/L (ref 49–135)
BUN/Creatinine Ratio: 10 — ABNORMAL LOW (ref 12–28)
BUN: 10 mg/dL (ref 8–27)
Bilirubin Total: 0.4 mg/dL (ref 0.0–1.2)
CO2: 26 mmol/L (ref 20–29)
Calcium: 9.5 mg/dL (ref 8.7–10.3)
Chloride: 104 mmol/L (ref 96–106)
Creatinine, Ser: 0.99 mg/dL (ref 0.57–1.00)
Globulin, Total: 2.2 g/dL (ref 1.5–4.5)
Glucose: 93 mg/dL (ref 70–99)
Potassium: 4.4 mmol/L (ref 3.5–5.2)
Sodium: 141 mmol/L (ref 134–144)
Total Protein: 6.3 g/dL (ref 6.0–8.5)
eGFR: 64 mL/min/1.73

## 2024-10-30 LAB — TSH+FREE T4
Free T4: 1.56 ng/dL (ref 0.82–1.77)
TSH: 0.44 u[IU]/mL — ABNORMAL LOW (ref 0.450–4.500)

## 2024-11-07 ENCOUNTER — Ambulatory Visit (INDEPENDENT_AMBULATORY_CARE_PROVIDER_SITE_OTHER): Payer: 59

## 2024-11-07 DIAGNOSIS — I428 Other cardiomyopathies: Secondary | ICD-10-CM

## 2024-11-07 LAB — CUP PACEART REMOTE DEVICE CHECK
Battery Remaining Longevity: 30 mo
Battery Remaining Percentage: 37 %
Brady Statistic RA Percent Paced: 0 %
Brady Statistic RV Percent Paced: 0 %
Date Time Interrogation Session: 20260115000100
HighPow Impedance: 53 Ohm
Implantable Lead Connection Status: 753985
Implantable Lead Connection Status: 753985
Implantable Lead Implant Date: 20090320
Implantable Lead Implant Date: 20090320
Implantable Lead Location: 753859
Implantable Lead Location: 753860
Implantable Lead Model: 158
Implantable Lead Model: 5076
Implantable Lead Serial Number: 182504
Implantable Pulse Generator Implant Date: 20160307
Lead Channel Impedance Value: 420 Ohm
Lead Channel Impedance Value: 525 Ohm
Lead Channel Pacing Threshold Amplitude: 0.4 V
Lead Channel Pacing Threshold Amplitude: 1.1 V
Lead Channel Pacing Threshold Pulse Width: 0.4 ms
Lead Channel Pacing Threshold Pulse Width: 0.4 ms
Lead Channel Setting Pacing Amplitude: 2 V
Lead Channel Setting Pacing Amplitude: 2.5 V
Lead Channel Setting Pacing Pulse Width: 0.4 ms
Lead Channel Setting Sensing Sensitivity: 0.6 mV
Pulse Gen Serial Number: 111826

## 2024-11-10 ENCOUNTER — Ambulatory Visit: Payer: Self-pay | Admitting: Student in an Organized Health Care Education/Training Program

## 2024-11-12 NOTE — Progress Notes (Unsigned)
 "  Office Visit Note  Patient: [name] [name]             Date of Birth: 1961-08-10           MRN: [id number]             PCP: Jolinda Norene CHRISTELLA, DO Referring: Jolinda Norene CHRISTELLA, DO Visit Date: 11/26/2024 Occupation: Data Unavailable  Subjective:  No chief complaint on file.   History of Present Illness: [name] [name] is a 64 y.o. female ***     Activities of Daily Living:  Patient reports morning stiffness for 10 minutes.   Patient Reports nocturnal pain.  Difficulty dressing/grooming: Denies Difficulty climbing stairs: Reports Difficulty getting out of chair: Reports Difficulty using hands for taps, buttons, cutlery, and/or writing: Reports  Review of Systems  Constitutional:  Negative for fatigue.  HENT:  Positive for mouth dryness. Negative for mouth sores.   Eyes:  Positive for dryness.  Respiratory:  Negative for shortness of breath.   Cardiovascular:  Negative for chest pain and palpitations.  Gastrointestinal:  Positive for constipation. Negative for blood in stool and diarrhea.  Endocrine: Positive for increased urination.  Genitourinary:  Positive for involuntary urination.  Musculoskeletal:  Positive for joint pain, gait problem, joint pain, myalgias, muscle weakness, morning stiffness, muscle tenderness and myalgias. Negative for joint swelling.  Skin:  Negative for color change, rash, hair loss and sensitivity to sunlight.  Allergic/Immunologic: Positive for susceptible to infections.  Neurological:  Negative for dizziness and headaches.  Hematological:  Negative for swollen glands.  Psychiatric/Behavioral:  Positive for sleep disturbance. Negative for depressed mood. The patient is nervous/anxious.     PMFS History:  Patient Active Problem List   Diagnosis Date Noted   IBS (irritable colon syndrome) 11/27/2023   H/O adenomatous polyp of colon 11/27/2023   Laryngopharyngeal reflux (LPR) 05/31/2023   Tinnitus of both ears 05/31/2023   Upper airway cough  syndrome 11/28/2022   DOE (dyspnea on exertion) 11/28/2022   Paroxysmal atrial fibrillation (HCC) 06/20/2022   Urge incontinence of urine 05/17/2022   Obesity (BMI 30-39.9) 04/13/2022   Neuropathy 05/19/2021   Vestibulitis, vulvar 05/10/2021   Atrophic vaginitis 05/10/2021   Hypertensive disorder 05/10/2021   History of malignant neoplasm of breast 05/10/2021   Osteopenia 04/14/2021   NICM (nonischemic cardiomyopathy) (HCC) 07/23/2020   Post-surgical hypothyroidism 03/27/2020   Anemia 07/15/2019   Lumbar spondylosis with myelopathy 04/19/2019   Genetic testing 11/22/2016   Breast cancer- radiation June 2016 08/05/2016   Cardiac LV ejection fraction 30-35% 07/18/2016   Heart disease 04/22/2016   Breast cancer of upper-inner quadrant of left female breast (HCC) 03/09/2016   Chronic combined systolic and diastolic HF (heart failure) (HCC) 02/26/2014   Ejection fraction < 50%    Itching    Pure hypercholesterolemia    Leg swelling    Dual implantable cardioverter-defibrillator in situ    FOOT PAIN 01/20/2010   History of TIA 2007 01/19/2010    Past Medical History:  Diagnosis Date   A-fib (HCC)    Acute deep vein thrombosis (DVT) of right popliteal vein (HCC) 04/13/2022   Acute pulmonary embolism (HCC) 04/12/2022   AICD (automatic cardioverter/defibrillator) present 2009   [employer] scientific- replaced 2016   Allergy    Just seasonal   Anxiety 08/24   Just noticed when i tried to sing at church [employer] year. Became really nervous   Arthritis    Bradycardia    Breast cancer (HCC) 02/24/2016   s/p  lumpectomy and radiation but refused chemotherapy   Chronic systolic CHF (congestive heart failure) (HCC)    Cough due to angiotensin-converting enzyme inhibitor    Deep venous thrombosis of lower extremity (HCC) 05/24/2023   Dizziness    Dizziness with standing after squatting, March, 2012   DVT (deep venous thrombosis) (HCC)    Dyslipidemia    LDL elevation,Patient does not want  statin   Family history of breast cancer    GERD (gastroesophageal reflux disease)    H/O shortness of breath    CPX 10/18:  No evidence of cardiopulmonary limitation. Suspect exercise intolerance related to weight and deconditioning.    Hyperlipidemia 2007   Hypothyroidism    ICD (implantable cardiac defibrillator) battery depletion    Dr. Fernande, June, 2009(artifactual atrial tachycardia response events addressed by reprogramming in parentheses   Leg swelling    right leg   Mitral regurgitation    Myocardial infarction [facility]) 2007   Nonischemic cardiomyopathy (HCC)    Etiology unknown, diagnosed 2008   Paroxysmal atrial fibrillation (HCC) 06/20/2022   PE (pulmonary thromboembolism) (HCC)    Personal history of radiation therapy 2017   Pneumonia due to COVID-19 virus 10/2019   Pulmonary embolism (HCC) 04/21/2020   Stroke (HCC) 2007   TIA (transient ischemic attack)    No CT or MRI abnormality, aspirin  therapy   TIA (transient ischemic attack) 2007    Family History  Problem Relation Age of Onset   Heart attack Mother    Cancer Mother        breast   Breast cancer Mother 29   Arthritis Mother    Heart disease Mother    Hypertension Sister    Arthritis Sister    Hypertension Brother    Arthritis Brother    Cancer Paternal Grandmother        possible cancer, unknown type   Cancer Maternal Aunt        breast   Breast cancer Maternal Aunt    Diabetes Brother    Miscarriages / Stillbirths Daughter    Obesity Daughter    Obesity Son    Stroke Neg Hx    Colon cancer Neg Hx    Past Surgical History:  Procedure Laterality Date   BREAST BIOPSY Left 02/24/2016   U/S Core   BREAST BIOPSY Left 02/24/2016   U/S Core   BREAST LUMPECTOMY Left 04/05/2016   invasive ductal    BREAST LUMPECTOMY WITH RADIOACTIVE SEED AND SENTINEL LYMPH NODE BIOPSY Left 04/05/2016   Procedure: BREAST LUMPECTOMY WITH RADIOACTIVE SEED AND SENTINEL LYMPH NODE BIOPSY;  Surgeon: Alm Angle, MD;   Location: [facility] OR;  Service: General;  Laterality: Left;   CARDIAC CATHETERIZATION N/A 08/05/2016   Procedure: Left Heart Cath and Coronary Angiography;  Surgeon: Peter M Jordan, MD;  Location: [facility] INVASIVE CV LAB;  Service: Cardiovascular;  Laterality: N/A;   CARDIAC DEFIBRILLATOR PLACEMENT  09   [employer] no remote   COLONOSCOPY     COLONOSCOPY N/A 01/19/2024   Procedure: COLONOSCOPY;  Surgeon: Cindie Carlin POUR, DO;  Location: [facility] ENDO SUITE;  Service: Endoscopy;  Laterality: N/A;  11:00 am, asa 3/4   CYST EXCISION Left    back of leg-lt    ESOPHAGOGASTRODUODENOSCOPY N/A 01/19/2024   Procedure: EGD (ESOPHAGOGASTRODUODENOSCOPY);  Surgeon: Cindie Carlin POUR, DO;  Location: [facility];  Service: Endoscopy;  Laterality: N/A;   IMPLANTABLE CARDIOVERTER DEFIBRILLATOR GENERATOR CHANGE N/A 12/29/2014   Procedure: IMPLANTABLE CARDIOVERTER DEFIBRILLATOR GENERATOR CHANGE;  Surgeon: Elspeth BROCKS  Fernande, MD;  Location: [facility];  Service: Cardiovascular;  Laterality: N/A;   LATERAL EPICONDYLE RELEASE  10/11/2012   Procedure: TENNIS ELBOW RELEASE;  Surgeon: Toribio JULIANNA Chancy, MD;  Location: [facility];  Service: Orthopedics;  Laterality: Right;  RIGHT ELBOW: TENOTOMY ELBOW LATERAL EPICONDYLITIS TENNIS ELBOW: RADIAL TUNNEL RELEASE   POLYPECTOMY  01/19/2024   Procedure: POLYPECTOMY;  Surgeon: Cindie Carlin POUR, DO;  Location: [facility];  Service: Endoscopy;;   PORT-A-CATH REMOVAL  04/13/2016   Procedure: MINOR REMOVAL PORT-A-CATH;  Surgeon: Donnice Lima, MD;  Location: [facility];  Service: General;;   PORTACATH PLACEMENT Right 04/05/2016   Procedure: INSERTION PORT-A-CATH WITH US ;  Surgeon: Alm Angle, MD;  [facility];  Service: General;  Laterality: Right;  right IJ   TOTAL THYROIDECTOMY  2001   TUBAL LIGATION  94   UPPER GI ENDOSCOPY     Social History[1] Social History   Social History Narrative   Not on file     Immunization History  Administered  Date(s) Administered   Moderna Sars-Covid-2 Vaccination 01/02/2020, 02/04/2020, 10/19/2020   Tdap 04/05/2013, 06/21/2023     Objective: Vital Signs: There were no vitals taken for this visit.   Physical Exam   Musculoskeletal Exam: ***  CDAI Exam: CDAI Score: -- Patient Global: --; Provider Global: -- Swollen: --; Tender: -- Joint Exam 11/26/2024   No joint exam has been documented for this visit   There is currently no information documented on the homunculus. Go to the Rheumatology activity and complete the homunculus joint exam.  Investigation: No additional findings.  Imaging: CUP PACEART REMOTE DEVICE CHECK Result Date: 11/07/2024 ICD Scheduled remote reviewed. Normal device function.  Presenting rhythm:  AS/VS 6 NSVT events, 2 EGM's, HR's 190's, 8-12 beats in duration Next remote transmission per protocol. LA, CVRS   Recent Labs: Lab Results  Component Value Date   WBC 5.7 10/29/2024   HGB 11.9 10/29/2024   PLT 230 10/29/2024   NA 141 10/29/2024   K 4.4 10/29/2024   CL 104 10/29/2024   CO2 26 10/29/2024   GLUCOSE 93 10/29/2024   BUN 10 10/29/2024   CREATININE 0.99 10/29/2024   BILITOT 0.4 10/29/2024   ALKPHOS 58 10/29/2024   AST 15 10/29/2024   ALT 9 10/29/2024   PROT 6.3 10/29/2024   ALBUMIN 4.1 10/29/2024   CALCIUM  9.5 10/29/2024   GFRAA 80 06/05/2020    Speciality Comments: No specialty comments available.  Procedures:  No procedures performed Allergies: Avelox [moxifloxacin hcl in nacl], Clindamycin/lincomycin, Kiwi extract, Bactrim [sulfamethoxazole-trimethoprim], Levofloxacin, Simvastatin, Spironolactone , Moxifloxacin hcl, and Tramadol   Assessment / Plan:     Visit Diagnoses: No diagnosis found.  Orders: No orders of the defined types were placed in this encounter.  No orders of the defined types were placed in this encounter.   Face-to-face time spent with patient was *** minutes. Greater than 50% of time was spent in counseling and  coordination of care.  Follow-Up Instructions: No follow-ups on file.   Alfonso Patterson, LPN  Note - This record has been created using [employer].  Chart creation errors have been sought, but may not always  have been located. Such creation errors do not reflect on  the standard of medical care.     [1]  Social History Tobacco Use   Smoking status: Never   Smokeless tobacco: Never  Vaping Use   Vaping status: Never Used  Substance Use Topics   Alcohol use:  No   Drug use: No   "

## 2024-11-14 NOTE — Progress Notes (Signed)
 Remote ICD Transmission

## 2024-11-20 ENCOUNTER — Ambulatory Visit: Admitting: Gastroenterology

## 2024-11-21 ENCOUNTER — Encounter: Payer: Self-pay | Admitting: Family Medicine

## 2024-11-21 ENCOUNTER — Telehealth: Payer: Self-pay | Admitting: Family Medicine

## 2024-11-21 NOTE — Telephone Encounter (Signed)
 I called patient to inform her of the difficulty on having getting her CAT scan approved.  I am currently awaiting response from a complaint I have filed with her insurance company as I have yet to be able to actually speak to somebody about her care.  She has a history of breast cancer with new pulmonary nodules and unplanned weight loss last July so I think it is extremely important that we repeat this imaging study to ensure we do not have some type of new malignant lesions in her lungs.  She voiced great appreciation for the phone call and understanding.  Will contact her once I have some more information

## 2024-11-22 NOTE — Telephone Encounter (Signed)
 Per pt request records faxed to fax number provided

## 2024-11-26 ENCOUNTER — Ambulatory Visit

## 2024-11-26 VITALS — BP 118/79 | HR 90 | Temp 98.0°F | Resp 14 | Ht 69.0 in | Wt 221.8 lb

## 2024-11-26 DIAGNOSIS — M2550 Pain in unspecified joint: Secondary | ICD-10-CM | POA: Diagnosis not present

## 2024-11-26 DIAGNOSIS — M353 Polymyalgia rheumatica: Secondary | ICD-10-CM | POA: Diagnosis not present

## 2024-11-26 DIAGNOSIS — I509 Heart failure, unspecified: Secondary | ICD-10-CM

## 2024-11-26 MED ORDER — PREDNISONE 5 MG PO TABS: ORAL_TABLET | ORAL | 0 refills | Status: AC

## 2024-11-29 LAB — RHEUMATOID FACTOR: Rheumatoid fact SerPl-aCnc: <10 [IU]/mL

## 2024-11-29 LAB — C-REACTIVE PROTEIN: CRP: 3.5 mg/L

## 2024-11-29 LAB — CYCLIC CITRUL PEPTIDE ANTIBODY, IGG: Cyclic Citrullin Peptide Ab: <16 U

## 2024-11-29 LAB — ANGIOTENSIN CONVERTING ENZYME: Angiotensin-Converting Enzyme: 36 U/L (ref 9–67)

## 2024-11-29 LAB — SEDIMENTATION RATE: Sed Rate: 48 mm/h — ABNORMAL HIGH (ref 0–30)

## 2024-12-24 ENCOUNTER — Ambulatory Visit: Attending: Family Medicine

## 2025-01-07 ENCOUNTER — Ambulatory Visit: Admitting: Gastroenterology

## 2025-01-10 ENCOUNTER — Ambulatory Visit: Admitting: Student in an Organized Health Care Education/Training Program

## 2025-01-31 ENCOUNTER — Ambulatory Visit: Admitting: Internal Medicine

## 2025-02-06 ENCOUNTER — Ambulatory Visit

## 2025-05-08 ENCOUNTER — Ambulatory Visit

## 2025-08-07 ENCOUNTER — Ambulatory Visit

## 2025-11-06 ENCOUNTER — Ambulatory Visit

## 2026-02-05 ENCOUNTER — Ambulatory Visit
